# Patient Record
Sex: Female | Born: 1965 | Race: White | Hispanic: No | Marital: Married | Smoking: Never smoker
Health system: Southern US, Community
[De-identification: ages and names within clinical notes are randomized; demographics above are authoritative.]

## PROBLEM LIST (undated history)

## (undated) DIAGNOSIS — E1165 Type 2 diabetes mellitus with hyperglycemia: Secondary | ICD-10-CM

## (undated) DIAGNOSIS — K5909 Other constipation: Secondary | ICD-10-CM

## (undated) DIAGNOSIS — F1920 Other psychoactive substance dependence, uncomplicated: Secondary | ICD-10-CM

## (undated) DIAGNOSIS — G8929 Other chronic pain: Secondary | ICD-10-CM

## (undated) DIAGNOSIS — F419 Anxiety disorder, unspecified: Secondary | ICD-10-CM

## (undated) DIAGNOSIS — B1920 Unspecified viral hepatitis C without hepatic coma: Secondary | ICD-10-CM

## (undated) DIAGNOSIS — K7460 Unspecified cirrhosis of liver: Secondary | ICD-10-CM

## (undated) DIAGNOSIS — N939 Abnormal uterine and vaginal bleeding, unspecified: Secondary | ICD-10-CM

## (undated) DIAGNOSIS — K219 Gastro-esophageal reflux disease without esophagitis: Secondary | ICD-10-CM

## (undated) DIAGNOSIS — R768 Other specified abnormal immunological findings in serum: Secondary | ICD-10-CM

## (undated) DIAGNOSIS — R109 Unspecified abdominal pain: Secondary | ICD-10-CM

## (undated) DIAGNOSIS — F329 Major depressive disorder, single episode, unspecified: Secondary | ICD-10-CM

## (undated) DIAGNOSIS — F1020 Alcohol dependence, uncomplicated: Secondary | ICD-10-CM

## (undated) DIAGNOSIS — K8020 Calculus of gallbladder without cholecystitis without obstruction: Secondary | ICD-10-CM

## (undated) DIAGNOSIS — K859 Acute pancreatitis without necrosis or infection, unspecified: Secondary | ICD-10-CM

## (undated) HISTORY — DX: Type 2 diabetes mellitus with hyperglycemia: E11.65

## (undated) HISTORY — PX: APPENDECTOMY: SHX54

## (undated) HISTORY — DX: Other specified abnormal immunological findings in serum: R76.8

## (undated) HISTORY — DX: Hemochromatosis, unspecified: E83.119

## (undated) HISTORY — DX: Unspecified viral hepatitis C without hepatic coma: B19.20

## (undated) HISTORY — DX: Anxiety disorder, unspecified: F41.9

## (undated) HISTORY — DX: Acute pancreatitis without necrosis or infection, unspecified: K85.90

## (undated) HISTORY — DX: Other psychoactive substance dependence, uncomplicated: F19.20

## (undated) HISTORY — DX: Abnormal uterine and vaginal bleeding, unspecified: N93.9

## (undated) HISTORY — DX: Alcohol dependence, uncomplicated: F10.20

## (undated) HISTORY — DX: Other constipation: K59.09

## (undated) HISTORY — DX: Gastro-esophageal reflux disease without esophagitis: K21.9

## (undated) HISTORY — DX: Calculus of gallbladder without cholecystitis without obstruction: K80.20

## (undated) HISTORY — DX: Unspecified abdominal pain: R10.9

## (undated) HISTORY — DX: Other chronic pain: G89.29

## (undated) HISTORY — DX: Unspecified cirrhosis of liver: K74.60

---

## 2000-01-25 ENCOUNTER — Encounter: Payer: Self-pay | Admitting: Emergency Medicine

## 2000-01-25 ENCOUNTER — Emergency Department (HOSPITAL_COMMUNITY): Admission: EM | Admit: 2000-01-25 | Discharge: 2000-01-25 | Payer: Self-pay | Admitting: Emergency Medicine

## 2004-04-22 DIAGNOSIS — K8020 Calculus of gallbladder without cholecystitis without obstruction: Secondary | ICD-10-CM

## 2004-04-22 HISTORY — DX: Calculus of gallbladder without cholecystitis without obstruction: K80.20

## 2004-04-24 ENCOUNTER — Inpatient Hospital Stay (HOSPITAL_COMMUNITY): Admission: EM | Admit: 2004-04-24 | Discharge: 2004-04-29 | Payer: Self-pay | Admitting: Emergency Medicine

## 2004-04-26 ENCOUNTER — Encounter (INDEPENDENT_AMBULATORY_CARE_PROVIDER_SITE_OTHER): Payer: Self-pay | Admitting: Specialist

## 2004-05-08 ENCOUNTER — Ambulatory Visit: Payer: Self-pay | Admitting: Internal Medicine

## 2004-06-19 ENCOUNTER — Ambulatory Visit: Payer: Self-pay | Admitting: Internal Medicine

## 2004-06-25 ENCOUNTER — Ambulatory Visit (HOSPITAL_COMMUNITY): Admission: RE | Admit: 2004-06-25 | Discharge: 2004-06-25 | Payer: Self-pay | Admitting: Internal Medicine

## 2004-07-09 ENCOUNTER — Encounter (INDEPENDENT_AMBULATORY_CARE_PROVIDER_SITE_OTHER): Payer: Self-pay | Admitting: *Deleted

## 2004-07-09 ENCOUNTER — Ambulatory Visit (HOSPITAL_COMMUNITY): Admission: RE | Admit: 2004-07-09 | Discharge: 2004-07-09 | Payer: Self-pay | Admitting: Gastroenterology

## 2004-07-13 ENCOUNTER — Ambulatory Visit: Payer: Self-pay | Admitting: Internal Medicine

## 2004-07-18 ENCOUNTER — Ambulatory Visit (HOSPITAL_COMMUNITY): Admission: RE | Admit: 2004-07-18 | Discharge: 2004-07-18 | Payer: Self-pay | Admitting: Internal Medicine

## 2004-07-26 ENCOUNTER — Ambulatory Visit: Payer: Self-pay | Admitting: Internal Medicine

## 2004-08-02 ENCOUNTER — Ambulatory Visit: Payer: Self-pay | Admitting: Internal Medicine

## 2004-09-04 ENCOUNTER — Ambulatory Visit: Payer: Self-pay | Admitting: Internal Medicine

## 2004-11-23 ENCOUNTER — Ambulatory Visit: Payer: Self-pay | Admitting: Internal Medicine

## 2004-11-28 ENCOUNTER — Encounter (HOSPITAL_COMMUNITY): Admission: RE | Admit: 2004-11-28 | Discharge: 2005-02-26 | Payer: Self-pay | Admitting: Internal Medicine

## 2005-02-13 ENCOUNTER — Emergency Department (HOSPITAL_COMMUNITY): Admission: EM | Admit: 2005-02-13 | Discharge: 2005-02-13 | Payer: Self-pay | Admitting: Emergency Medicine

## 2005-02-28 ENCOUNTER — Encounter (HOSPITAL_COMMUNITY): Admission: RE | Admit: 2005-02-28 | Discharge: 2005-05-29 | Payer: Self-pay | Admitting: Internal Medicine

## 2005-05-09 ENCOUNTER — Ambulatory Visit: Payer: Self-pay | Admitting: Internal Medicine

## 2005-06-26 ENCOUNTER — Encounter (HOSPITAL_COMMUNITY): Admission: RE | Admit: 2005-06-26 | Discharge: 2005-09-24 | Payer: Self-pay | Admitting: Internal Medicine

## 2005-07-21 ENCOUNTER — Emergency Department (HOSPITAL_COMMUNITY): Admission: EM | Admit: 2005-07-21 | Discharge: 2005-07-22 | Payer: Self-pay | Admitting: Emergency Medicine

## 2005-12-05 ENCOUNTER — Ambulatory Visit: Payer: Self-pay | Admitting: Gastroenterology

## 2005-12-20 ENCOUNTER — Emergency Department (HOSPITAL_COMMUNITY): Admission: EM | Admit: 2005-12-20 | Discharge: 2005-12-21 | Payer: Self-pay | Admitting: Emergency Medicine

## 2006-02-10 ENCOUNTER — Emergency Department (HOSPITAL_COMMUNITY): Admission: EM | Admit: 2006-02-10 | Discharge: 2006-02-10 | Payer: Self-pay | Admitting: Emergency Medicine

## 2006-03-06 ENCOUNTER — Ambulatory Visit: Payer: Self-pay | Admitting: Gastroenterology

## 2006-04-03 ENCOUNTER — Ambulatory Visit: Payer: Self-pay | Admitting: Gastroenterology

## 2006-04-10 ENCOUNTER — Emergency Department (HOSPITAL_COMMUNITY): Admission: EM | Admit: 2006-04-10 | Discharge: 2006-04-11 | Payer: Self-pay | Admitting: Emergency Medicine

## 2006-04-11 ENCOUNTER — Ambulatory Visit: Payer: Self-pay | Admitting: *Deleted

## 2006-04-11 ENCOUNTER — Inpatient Hospital Stay (HOSPITAL_COMMUNITY): Admission: AD | Admit: 2006-04-11 | Discharge: 2006-04-14 | Payer: Self-pay | Admitting: *Deleted

## 2006-04-28 ENCOUNTER — Ambulatory Visit: Payer: Self-pay | Admitting: Cardiology

## 2006-04-28 ENCOUNTER — Inpatient Hospital Stay (HOSPITAL_COMMUNITY): Admission: EM | Admit: 2006-04-28 | Discharge: 2006-05-03 | Payer: Self-pay | Admitting: Emergency Medicine

## 2006-04-28 ENCOUNTER — Ambulatory Visit: Payer: Self-pay | Admitting: Internal Medicine

## 2006-04-29 ENCOUNTER — Encounter: Payer: Self-pay | Admitting: Cardiology

## 2006-10-04 ENCOUNTER — Emergency Department (HOSPITAL_COMMUNITY): Admission: EM | Admit: 2006-10-04 | Discharge: 2006-10-04 | Payer: Self-pay | Admitting: Emergency Medicine

## 2006-12-21 ENCOUNTER — Emergency Department (HOSPITAL_COMMUNITY): Admission: EM | Admit: 2006-12-21 | Discharge: 2006-12-22 | Payer: Self-pay | Admitting: Emergency Medicine

## 2006-12-22 ENCOUNTER — Ambulatory Visit: Payer: Self-pay | Admitting: Psychiatry

## 2006-12-22 ENCOUNTER — Inpatient Hospital Stay (HOSPITAL_COMMUNITY): Admission: AD | Admit: 2006-12-22 | Discharge: 2006-12-25 | Payer: Self-pay | Admitting: Psychiatry

## 2007-01-03 ENCOUNTER — Inpatient Hospital Stay (HOSPITAL_COMMUNITY): Admission: EM | Admit: 2007-01-03 | Discharge: 2007-01-10 | Payer: Self-pay | Admitting: Emergency Medicine

## 2007-01-04 ENCOUNTER — Ambulatory Visit: Payer: Self-pay | Admitting: Internal Medicine

## 2007-01-08 ENCOUNTER — Ambulatory Visit: Payer: Self-pay | Admitting: Gastroenterology

## 2007-02-01 ENCOUNTER — Emergency Department (HOSPITAL_COMMUNITY): Admission: EM | Admit: 2007-02-01 | Discharge: 2007-02-01 | Payer: Self-pay | Admitting: Emergency Medicine

## 2007-03-25 ENCOUNTER — Emergency Department (HOSPITAL_COMMUNITY): Admission: EM | Admit: 2007-03-25 | Discharge: 2007-03-25 | Payer: Self-pay | Admitting: Emergency Medicine

## 2007-05-03 ENCOUNTER — Emergency Department (HOSPITAL_COMMUNITY): Admission: EM | Admit: 2007-05-03 | Discharge: 2007-05-04 | Payer: Self-pay | Admitting: Emergency Medicine

## 2007-05-29 ENCOUNTER — Ambulatory Visit: Payer: Self-pay | Admitting: Internal Medicine

## 2007-05-29 DIAGNOSIS — G5600 Carpal tunnel syndrome, unspecified upper limb: Secondary | ICD-10-CM

## 2007-05-29 DIAGNOSIS — K920 Hematemesis: Secondary | ICD-10-CM

## 2007-05-29 DIAGNOSIS — F329 Major depressive disorder, single episode, unspecified: Secondary | ICD-10-CM

## 2007-05-29 DIAGNOSIS — R5383 Other fatigue: Secondary | ICD-10-CM

## 2007-05-29 DIAGNOSIS — R5381 Other malaise: Secondary | ICD-10-CM | POA: Insufficient documentation

## 2007-05-29 DIAGNOSIS — F1010 Alcohol abuse, uncomplicated: Secondary | ICD-10-CM | POA: Insufficient documentation

## 2007-05-29 DIAGNOSIS — K219 Gastro-esophageal reflux disease without esophagitis: Secondary | ICD-10-CM

## 2007-05-29 DIAGNOSIS — M2550 Pain in unspecified joint: Secondary | ICD-10-CM

## 2007-06-05 ENCOUNTER — Encounter (INDEPENDENT_AMBULATORY_CARE_PROVIDER_SITE_OTHER): Payer: Self-pay | Admitting: *Deleted

## 2007-06-05 LAB — CONVERTED CEMR LAB
ALT: 170 units/L — ABNORMAL HIGH (ref 0–35)
Alkaline Phosphatase: 60 units/L (ref 39–117)
Basophils Absolute: 0 10*3/uL (ref 0.0–0.1)
Bilirubin, Direct: 0.2 mg/dL (ref 0.0–0.3)
CO2: 30 meq/L (ref 19–32)
Calcium: 9.7 mg/dL (ref 8.4–10.5)
Creatinine, Ser: 0.8 mg/dL (ref 0.4–1.2)
Eosinophils Absolute: 0.1 10*3/uL (ref 0.0–0.6)
Eosinophils Relative: 1.9 % (ref 0.0–5.0)
GFR calc Af Amer: 102 mL/min
Glucose, Bld: 89 mg/dL (ref 70–99)
HCT: 40.1 % (ref 36.0–46.0)
Hemoglobin: 13.2 g/dL (ref 12.0–15.0)
MCHC: 32.8 g/dL (ref 30.0–36.0)
MCV: 96.1 fL (ref 78.0–100.0)
Monocytes Absolute: 0.4 10*3/uL (ref 0.2–0.7)
Neutrophils Relative %: 65 % (ref 43.0–77.0)
Potassium: 4.3 meq/L (ref 3.5–5.1)
Rhuematoid fact SerPl-aCnc: 37.1 intl units/mL — ABNORMAL HIGH (ref 0.0–20.0)
Saturation Ratios: 18.4 % — ABNORMAL LOW (ref 20.0–50.0)
TSH: 1.39 microintl units/mL (ref 0.35–5.50)
Total Bilirubin: 0.7 mg/dL (ref 0.3–1.2)
Total Protein: 7.3 g/dL (ref 6.0–8.3)
WBC: 4.2 10*3/uL — ABNORMAL LOW (ref 4.5–10.5)

## 2007-08-18 ENCOUNTER — Telehealth (INDEPENDENT_AMBULATORY_CARE_PROVIDER_SITE_OTHER): Payer: Self-pay | Admitting: *Deleted

## 2007-11-01 ENCOUNTER — Emergency Department (HOSPITAL_COMMUNITY): Admission: EM | Admit: 2007-11-01 | Discharge: 2007-11-01 | Payer: Self-pay | Admitting: Emergency Medicine

## 2008-02-07 ENCOUNTER — Emergency Department (HOSPITAL_COMMUNITY): Admission: EM | Admit: 2008-02-07 | Discharge: 2008-02-07 | Payer: Self-pay | Admitting: Emergency Medicine

## 2008-09-20 ENCOUNTER — Emergency Department (HOSPITAL_COMMUNITY): Admission: EM | Admit: 2008-09-20 | Discharge: 2008-09-20 | Payer: Self-pay | Admitting: Emergency Medicine

## 2009-01-21 ENCOUNTER — Emergency Department (HOSPITAL_COMMUNITY): Admission: EM | Admit: 2009-01-21 | Discharge: 2009-01-22 | Payer: Self-pay | Admitting: Emergency Medicine

## 2009-04-22 DIAGNOSIS — G8929 Other chronic pain: Secondary | ICD-10-CM

## 2009-04-22 HISTORY — DX: Other chronic pain: G89.29

## 2009-04-22 HISTORY — PX: PERCUTANEOUS LIVER BIOPSY: SUR136

## 2009-05-03 ENCOUNTER — Emergency Department (HOSPITAL_COMMUNITY): Admission: EM | Admit: 2009-05-03 | Discharge: 2009-05-03 | Payer: Self-pay | Admitting: Emergency Medicine

## 2009-05-30 ENCOUNTER — Telehealth (INDEPENDENT_AMBULATORY_CARE_PROVIDER_SITE_OTHER): Payer: Self-pay | Admitting: *Deleted

## 2009-05-30 ENCOUNTER — Ambulatory Visit: Payer: Self-pay | Admitting: Internal Medicine

## 2009-05-30 DIAGNOSIS — R799 Abnormal finding of blood chemistry, unspecified: Secondary | ICD-10-CM | POA: Insufficient documentation

## 2009-05-30 LAB — CONVERTED CEMR LAB
ALT: 238 units/L — ABNORMAL HIGH (ref 0–35)
Albumin: 3.6 g/dL (ref 3.5–5.2)
Basophils Relative: 1.3 % (ref 0.0–3.0)
Bilirubin, Direct: 0.3 mg/dL (ref 0.0–0.3)
CO2: 29 meq/L (ref 19–32)
Chloride: 106 meq/L (ref 96–112)
Creatinine, Ser: 0.7 mg/dL (ref 0.4–1.2)
Eosinophils Absolute: 0.1 10*3/uL (ref 0.0–0.7)
Eosinophils Relative: 3.4 % (ref 0.0–5.0)
Ferritin: 18.4 ng/mL (ref 10.0–291.0)
H Pylori IgG: NEGATIVE
HCT: 36 % (ref 36.0–46.0)
HDL: 95.9 mg/dL (ref >39.00)
Hemoglobin: 12 g/dL (ref 12.0–15.0)
Hgb A1c MFr Bld: 6.2 % (ref 4.6–6.5)
Ketones, ur: NEGATIVE mg/dL
Leukocytes, UA: NEGATIVE
Lymphs Abs: 1.1 10*3/uL (ref 0.7–4.0)
MCHC: 33.3 g/dL (ref 30.0–36.0)
MCV: 95 fL (ref 78.0–100.0)
Monocytes Absolute: 0.4 10*3/uL (ref 0.1–1.0)
Neutro Abs: 1 10*3/uL — ABNORMAL LOW (ref 1.4–7.7)
Neutrophils Relative %: 36.8 % — ABNORMAL LOW (ref 43.0–77.0)
Nitrite: NEGATIVE
Potassium: 3.8 meq/L (ref 3.5–5.1)
RBC: 3.78 M/uL — ABNORMAL LOW (ref 3.87–5.11)
Saturation Ratios: 14.2 % — ABNORMAL LOW (ref 20.0–50.0)
Specific Gravity, Urine: 1.01 (ref 1.000–1.030)
TSH: 0.64 microintl units/mL (ref 0.35–5.50)
Total Protein, Urine: NEGATIVE mg/dL
Total Protein: 8 g/dL (ref 6.0–8.3)
Transferrin: 291.9 mg/dL (ref 212.0–360.0)
Triglycerides: 74 mg/dL (ref 0.0–149.0)
WBC: 2.6 10*3/uL — ABNORMAL LOW (ref 4.5–10.5)
pH: 7 (ref 5.0–8.0)

## 2009-05-31 ENCOUNTER — Encounter (INDEPENDENT_AMBULATORY_CARE_PROVIDER_SITE_OTHER): Payer: Self-pay | Admitting: *Deleted

## 2009-06-02 LAB — CONVERTED CEMR LAB
HCV Ab: REACTIVE — AB
HCV Quantitative: 4940000 intl units/mL — ABNORMAL HIGH (ref <43)
Hep A IgM: NEGATIVE
Hepatitis B Surface Ag: NEGATIVE

## 2009-06-07 ENCOUNTER — Ambulatory Visit: Payer: Self-pay | Admitting: Internal Medicine

## 2009-06-27 ENCOUNTER — Ambulatory Visit: Payer: Self-pay | Admitting: Internal Medicine

## 2009-06-30 ENCOUNTER — Ambulatory Visit: Payer: Self-pay | Admitting: Internal Medicine

## 2009-06-30 DIAGNOSIS — R109 Unspecified abdominal pain: Secondary | ICD-10-CM

## 2009-06-30 DIAGNOSIS — E739 Lactose intolerance, unspecified: Secondary | ICD-10-CM

## 2009-06-30 LAB — CONVERTED CEMR LAB
Ketones, ur: NEGATIVE mg/dL
Urine Glucose: 250 mg/dL
Urobilinogen, UA: 4 (ref 0.0–1.0)

## 2009-07-10 ENCOUNTER — Inpatient Hospital Stay (HOSPITAL_COMMUNITY): Admission: EM | Admit: 2009-07-10 | Discharge: 2009-07-21 | Payer: Self-pay | Admitting: Emergency Medicine

## 2009-07-16 ENCOUNTER — Ambulatory Visit: Payer: Self-pay | Admitting: Psychiatry

## 2009-07-18 ENCOUNTER — Ambulatory Visit: Payer: Self-pay | Admitting: Gastroenterology

## 2009-07-19 ENCOUNTER — Encounter: Payer: Self-pay | Admitting: Gastroenterology

## 2009-07-24 ENCOUNTER — Ambulatory Visit: Payer: Self-pay | Admitting: Internal Medicine

## 2009-07-24 DIAGNOSIS — F10231 Alcohol dependence with withdrawal delirium: Secondary | ICD-10-CM

## 2009-07-24 DIAGNOSIS — K5900 Constipation, unspecified: Secondary | ICD-10-CM | POA: Insufficient documentation

## 2009-07-24 DIAGNOSIS — D259 Leiomyoma of uterus, unspecified: Secondary | ICD-10-CM

## 2009-07-24 DIAGNOSIS — D61818 Other pancytopenia: Secondary | ICD-10-CM | POA: Insufficient documentation

## 2009-07-25 ENCOUNTER — Telehealth: Payer: Self-pay | Admitting: Internal Medicine

## 2009-07-31 ENCOUNTER — Ambulatory Visit: Payer: Self-pay | Admitting: Internal Medicine

## 2009-07-31 LAB — CONVERTED CEMR LAB
BUN: 9 mg/dL (ref 6–23)
Basophils Relative: 0.3 % (ref 0.0–3.0)
Creatinine, Ser: 0.7 mg/dL (ref 0.4–1.2)
Eosinophils Relative: 2.1 % (ref 0.0–5.0)
GFR calc non Af Amer: 96.61 mL/min (ref >60)
Glucose, Bld: 101 mg/dL — ABNORMAL HIGH (ref 70–99)
HCT: 33.7 % — ABNORMAL LOW (ref 36.0–46.0)
Hemoglobin: 11.4 g/dL — ABNORMAL LOW (ref 12.0–15.0)
Lymphs Abs: 0.9 10*3/uL (ref 0.7–4.0)
MCV: 88.8 fL (ref 78.0–100.0)
Monocytes Absolute: 0.3 10*3/uL (ref 0.1–1.0)
Neutro Abs: 2 10*3/uL (ref 1.4–7.7)
Neutrophils Relative %: 60.4 % (ref 43.0–77.0)
Potassium: 3.5 meq/L (ref 3.5–5.1)
RBC: 3.79 M/uL — ABNORMAL LOW (ref 3.87–5.11)
WBC: 3.3 10*3/uL — ABNORMAL LOW (ref 4.5–10.5)

## 2009-09-13 ENCOUNTER — Emergency Department (HOSPITAL_COMMUNITY): Admission: EM | Admit: 2009-09-13 | Discharge: 2009-09-14 | Payer: Self-pay | Admitting: Emergency Medicine

## 2009-11-16 ENCOUNTER — Ambulatory Visit: Payer: Self-pay | Admitting: Gastroenterology

## 2009-11-16 ENCOUNTER — Encounter: Payer: Self-pay | Admitting: Internal Medicine

## 2009-12-11 ENCOUNTER — Ambulatory Visit (HOSPITAL_COMMUNITY): Admission: RE | Admit: 2009-12-11 | Discharge: 2009-12-11 | Payer: Self-pay | Admitting: Gastroenterology

## 2009-12-14 ENCOUNTER — Ambulatory Visit: Payer: Self-pay | Admitting: Gastroenterology

## 2009-12-23 ENCOUNTER — Emergency Department (HOSPITAL_COMMUNITY): Admission: EM | Admit: 2009-12-23 | Discharge: 2009-12-23 | Payer: Self-pay | Admitting: Emergency Medicine

## 2010-01-25 ENCOUNTER — Encounter: Payer: Self-pay | Admitting: Internal Medicine

## 2010-01-25 ENCOUNTER — Ambulatory Visit: Payer: Self-pay | Admitting: Gastroenterology

## 2010-03-07 ENCOUNTER — Encounter: Admission: RE | Admit: 2010-03-07 | Discharge: 2010-03-07 | Payer: Self-pay | Admitting: Internal Medicine

## 2010-03-07 LAB — HM MAMMOGRAPHY: HM Mammogram: NEGATIVE

## 2010-03-18 ENCOUNTER — Emergency Department (HOSPITAL_COMMUNITY): Admission: EM | Admit: 2010-03-18 | Discharge: 2010-03-18 | Payer: Self-pay | Admitting: Family Medicine

## 2010-05-03 ENCOUNTER — Ambulatory Visit: Admit: 2010-05-03 | Payer: Self-pay | Admitting: Internal Medicine

## 2010-05-13 ENCOUNTER — Encounter: Payer: Self-pay | Admitting: Internal Medicine

## 2010-05-13 ENCOUNTER — Encounter: Payer: Self-pay | Admitting: Gastroenterology

## 2010-05-22 NOTE — Letter (Signed)
Summary: [facility] Consult Scheduled Letter  [facility]  [address] [place], [place] [address]   Phone: [phone]  Fax: [phone]      05/31/2009 MRN: [id number]  [facility] [address] [place], [place]  [address]    Dear Ms. Bilello,      We have scheduled an appointment for you.  At the recommendation of Dr.John, we have scheduled you a consult with Dr.Gessner( [facility]) on March 8,2011 at 9:00 am.  Their phone number is [phone].If this appointment day and time is not convenient for you, please feel free to call the office of the doctor you are being referred to at the number listed above and reschedule the appointment.  [facility] [address]   Thank you,  Patient Care Coordinator [facility]

## 2010-05-22 NOTE — Assessment & Plan Note (Signed)
Summary: 1 WK POST HOSP  STC   Vital Signs:  Patient profile:   45 year old female Height:      62 inches Weight:      139.50 pounds BMI:     25.61 O2 Sat:      94 % on Room air Temp:     99.9 degrees F oral Pulse rate:   116 / minute BP sitting:   98 / 60  (left arm) Cuff size:   regular  Vitals Entered ByZella Ball Ewing (July 24, 2009 4:50 PM)  O2 Flow:  Room air CC: 1 week post hospital/RE   CC:  1 week post hospital/RE.  History of Present Illness: here overall doing Ok,  except feels "head cloudy" on the 200 mg sertraline,  pt recently hospd with  abd pain felt due to constipatoin/uterine fibroid and/or IBS, went through DT's successfully, and noted pancytopenia likely due to ETOH.  Pt now d/c'd since apr 1, states good compliance with meds, except not taking the metformin ER 500.  Sugars while hospd seemed fine, and she had this 1 per day as a "temp" med prior (see EMR for details).  Not known to have persistent elev BS' s but CBG in the office today 232.  Denies polys, not clear if the metformin she had for a week contributed to her constipation.  Still with some ongoing consitpation but also has been using the limited rx oxycodone from d/c as well. Eating better at home, here with mother who corroborates.  No ETOH use since d/c.  Still somewhat tremulous and shaky but the librium helps.  No n/vfever , St, cough and Pt denies CP, sob, doe, wheezing, orthopnea, pnd, worsening LE edema, palps, dizziness or syncope   No reflux symtpoms, or dysphagia, blood.  No overt bleeding or bruising,  vag bleeding stopped, has appt to f/u with GYN.    Preventive Screening-Counseling & Management      Drug Use:  no.    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 2 By Mouth Once Daily 2)  Sertraline Hcl 100 Mg Tabs (Sertraline Hcl) .... 2po Once Daily 3)  Chlordiazepoxide Hcl 25 Mg Caps (Chlordiazepoxide Hcl) .Marland Kitchen.. 1 - 2 By Mouth Two Times A Day As Needed 4)  Metformin Hcl 500 Mg Xr24h-Tab (Metformin Hcl) .Marland Kitchen.. 1 By Mouth Once Daily For 7 Days To Help With Sugar 5)  Ciprofloxacin Hcl 500 Mg Tabs (Ciprofloxacin Hcl) .Marland Kitchen.. 1po Two Times A Day  Current Medications (verified): 1)  Omeprazole 20 Mg  Cpdr (Omeprazole) .... 2 By Mouth Once Daily 2)  Sertraline Hcl 100 Mg Tabs (Sertraline Hcl) .Marland Kitchen.. 1 Po Once Daily 3)  Chlordiazepoxide Hcl 25 Mg Caps (Chlordiazepoxide Hcl) .Marland Kitchen.. 1 - 2 By Mouth Two Times A Day As Needed 4)  Freestyle Lite Test  Strp (Glucose Blood) .... Use Asd 1 Once  Daily 5)  Lancets  Misc (Lancets) .... Use Asd 1 Once Daily 6)  Folic Acid 1 Mg Tabs (Folic Acid) .Marland Kitchen.. 1po Once Daily 7)  Thiamine Hcl 100 Mg Tabs (Thiamine Hcl) .Marland Kitchen.. 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   Head:  normocephalic and atraumatic.   Eyes:  vision grossly intact, pupils equal, and pupils round.   Ears:  R ear normal and L ear normal.   Nose:  no external deformity and no nasal discharge.   Mouth:  no gingival abnormalities and pharynx pink and moist.   Neck:  supple and no masses.   Lungs:  normal respiratory effort and normal breath sounds.   Heart:  normal rate and regular rhythm.   Abdomen:  soft, non-tender, and normal bowel sounds.   Msk:  no joint tenderness and no joint swelling.   Extremities:  no edema, no erythema  Psych:  not depressed appearing and moderately anxious.     Impression & Recommendations:  Problem # 1:  CONSTIPATION (ICD-564.00) chronic, for miralax daily  Problem # 2:  DEPRESSION (ICD-311)  Her updated medication list for this problem includes:    Sertraline Hcl 100 Mg Tabs (Sertraline hcl) .Marland Kitchen... 1 po once daily    Chlordiazepoxide Hcl 25 Mg Caps (Chlordiazepoxide hcl) .Marland Kitchen... 1 - 2 by mouth two times a day as needed ok for reduced sertraline as above  Problem # 3:  PANCYTOPENIA (ICD-284.1)  Her updated medication list for this problem includes:    Folic Acid 1 Mg Tabs (Folic acid) .Marland Kitchen... 1po once daily to cont off ETOH;  for f/u lab one wk, declines further labs today  Problem # 4:  GLUCOSE INTOLERANCE (ICD-271.3) ? DM - gave glucometer and strips;  to check sugars and call with results in 3 days;  also check a1c with next labs in 1 wk  Complete Medication List: 1)  Omeprazole 20 Mg Cpdr (Omeprazole) .... 2 by mouth once daily 2)  Sertraline Hcl 100 Mg Tabs (Sertraline hcl) .Marland Kitchen.. 1 po  once daily 3)  Chlordiazepoxide Hcl 25 Mg Caps (Chlordiazepoxide hcl) .Marland Kitchen.. 1 - 2 by mouth two times a day as needed 4)  Freestyle Lite Test Strp (Glucose blood) .... Use asd 1 once daily 5)  Lancets Misc (Lancets) .... Use asd 1 once daily 6)  Folic Acid 1 Mg Tabs (Folic acid) .Marland Kitchen.. 1po once daily 7)  Thiamine Hcl 100 Mg Tabs (Thiamine hcl) .Marland Kitchen.. 1po once daily  Other Orders: Fingerstick (16[address]) Glucose, (CBG) (60454)  Patient Instructions: 1)  take miralax 17 gm in water per day with water 2)  check your sugars twice  per day and call friday later this wk with your numbers (you are given the glucometer and strips today) 3)  decrease the sertraline to 1 per day 4)  Continue all other previous medications as before this visit  5)  Please return for LAB only in 1 wk: 6)  BMP prior to visit, ICD-9: 790.2 7)  HbgA1C prior to visit, ICD-9: 790.2 8)  CBC w/ Diff prior to visit, ICD-9: 285.0 9)  Please schedule a follow-up appointment in 2 months, or sooner if needed Prescriptions: LANCETS  MISC (LANCETS) use asd 1 once daily  #100 x 11   Entered and Authorized by:   Corwin Levins MD   Signed by:   Corwin Levins MD on 07/24/2009   Method used:   Print then Give to Patient   RxID:   [id number] FREESTYLE LITE TEST  STRP (GLUCOSE BLOOD) use asd 1 once daily  #100 x 11   Entered and Authorized by:   Corwin Levins MD   Signed by:   Corwin Levins MD on 07/24/2009   Method used:   Print then Give to Patient   RxID:   [id number] SERTRALINE HCL 100 MG TABS (SERTRALINE HCL) 1 po once daily  #90 x 3   Entered and Authorized by:   Corwin Levins MD   Signed by:   Corwin Levins MD on 07/24/2009   Method used:   Print then Give to Patient   RxID:   [id number]

## 2010-05-22 NOTE — Procedures (Signed)
Summary: EGD: + H. Pylori   EGD  Procedure date:  08/02/2004  Findings:      Location: [facility]   Patient Name: [name], [name]. MRN:  Procedure Procedures: Panendoscopy (EGD) CPT: 43235.    with biopsy(s)/brushing(s). CPT: D1846139.  Personnel: Endoscopist: Iva Boop, MD, [facility].  Referred By: Corwin Levins, MD.  Exam Location: Exam performed in Outpatient Clinic. Outpatient  Patient Consent: Procedure, Alternatives, Risks and Benefits discussed, consent obtained, from patient. Consent was obtained by the RN.  Indications Symptoms: Abdominal pain, location: RUQ. Reflux symptoms  History  Current Medications: Patient is not currently taking Coumadin.  Pre-Exam Physical: Performed Aug 02, 2004  Cardio-pulmonary exam, HEENT exam WNL. Abdominal exam abnormal. Mental status exam WNL. Abnormal PE findings include: tender RUQ.  Exam Exam Info: Maximum depth of insertion Duodenum, intended Duodenum. Patient position: on left side. Gastric retroflexion performed. Images taken. ASA Classification: III. Tolerance: good.  Sedation Meds: Patient assessed and found to be appropriate for moderate (conscious) sedation. Fentanyl 50 mcg. given IV. Versed 5 mg. given IV. Cetacaine Spray 2 sprays given aerosolized.  Monitoring: BP and pulse monitoring done. Oximetry used. Supplemental O2 given  Findings - Normal: Proximal Esophagus to Body.  - Normal: Duodenal Bulb to Duodenal 2nd Portion.  - MUCOSAL ABNORMALITY: Antrum. Erosions present. Erythematous mucosa. Biopsy/Mucosal Abn taken. RUT done, results pending. ICD9: Gastritis, Unspecified: 535.50. Comment: several superficial erosisons, biopsy for RUT.   Assessment Abnormal examination, see findings above.  Diagnoses: 535.50: Gastritis, Unspecified.   Comments: Mild Erosive Gastritis. I doubt this is the cause of her pain. Events  Unplanned Intervention: No unplanned interventions were required.   Plans Comments: Since she complains of heartburn and has this gastritis, will start a PPI. Will treat H. pylori if positive. Needs to try heat to abdominal wall as I think at least some of her symptoms are musculoskeletal. If that is not helpful could try NSAID's but only on a PPI. I doubt her pain is from her gallbladder.  Disposition: After procedure patient sent to recovery. After recovery patient sent home.  Comments: She needs to do labs that were scheduled for last week. If PPI doesn't work then needs to consider further musculoskeletal evaluation vs. surgery evaluation.   CC:   Oliver Barre, MD   Ovidio Kin, MD  This report was created from the original endoscopy report, which was reviewed and signed by the above listed endoscopist.

## 2010-05-22 NOTE — Progress Notes (Signed)
----   Converted from flag ---- ---- 05/30/2009 3:20 PM, Corwin Levins MD wrote: pleaes send addon  to lab:  hgba1c - 790.2 ------------------------------ I sent addon to the lab

## 2010-05-22 NOTE — Progress Notes (Signed)
----   Converted from flag ---- ---- 07/24/2009 5:36 PM, Corwin Levins MD wrote: please call pt - needs future labs next mon apr 11:  cbc 285.9 bmet  790.2 hgba1c  790.2 ------------------------------  called pt left msg to call back 07/25/2009 called pt and informed of information and scheduled pt for labs 07/31/2009 07/25/2009

## 2010-05-22 NOTE — Letter (Signed)
Summary: New Patient letter  [facility]  [address] [place], [place] [address]   Phone: [phone]  Fax: [phone]       05/31/2009 MRN: [id number]  [facility] [address] [place], [place]  [address]  Dear [name],  Welcome to the Gastroenterology Division at [facility].    You are scheduled to see Dr. Leone Payor on 06-27-09 at 9:00a.m. on the 3rd floor at [facility], [address]  We ask that you try to arrive at our office 15 minutes prior to your appointment time to allow for check-in.  We would like you to complete the enclosed self-administered evaluation form prior to your visit and bring it with you on the day of your appointment.  We will review it with you.  Also, please bring a complete list of all your medications or, if you prefer, bring the medication bottles and we will list them.  Please bring your insurance card so that we may make a copy of it.  If your insurance requires a referral to see a specialist, please bring your referral form from your primary care physician.  Co-payments are due at the time of your visit and may be paid by cash, check or credit card.     Your office visit will consist of a consult with your physician (includes a physical exam), any laboratory testing he/she may order, scheduling of any necessary diagnostic testing (e.g. x-ray, ultrasound, CT-scan), and scheduling of a procedure (e.g. Endoscopy, Colonoscopy) if required.  Please allow enough time on your schedule to allow for any/all of these possibilities.    If you cannot keep your appointment, please call [phone] to cancel or reschedule prior to your appointment date.  This allows [place] the opportunity to schedule an appointment for another patient in need of care.  If you do not cancel or reschedule by 5 p.m. the business day prior to your appointment date, you will be charged a $50.00 late cancellation/no-show fee.    Thank you for choosing  [facility] for your medical needs.  We appreciate the opportunity to care for you.  Please visit [place] at our website  to learn more about our practice.                     Sincerely,                                                             The Gastroenterology Division

## 2010-05-22 NOTE — Procedures (Signed)
Summary: EGD/[facility]  EGD/[facility]   Imported By: Sherian Rein 07/25/2009 13:35:03  _____________________________________________________________________  External Attachment:    Type:   Image     Comment:   External Document

## 2010-05-22 NOTE — Assessment & Plan Note (Signed)
Summary: SWOLLEN BELOW BREASTS/ NWS  #   Vital Signs:  Patient profile:   45 year old female Height:      62 inches Weight:      146 pounds BMI:     26.80 O2 Sat:      96 % on Room air Temp:     98 degrees F oral Pulse rate:   89 / minute BP sitting:   116 / 70  (left arm) Cuff size:   regular  Vitals Entered ByZella Ball Ewing (May 30, 2009 9:31 AM)  O2 Flow:  Room air  CC: upper abdomen swollen/RE   CC:  upper abdomen swollen/RE.  History of Present Illness: here after lost to f/u for 2 yrs, for wellness, but also with gen'd abd pain, and swelling,  worse to upper abd area;  some nausea, vomit and small volume hematemesis 2 days ago;  no overt bleeding at this time and not vomited for 2 days;  no orthostatic, but has night sweats and diffculty sleeping.  No fever . cont's to have difficutl marital relationship and drinking continues - approx 1 pint per day.  No longer has a job after laid off from [employer] when it closed.  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 1 By Mouth Qid Prn 2)  Omeprazole 20 Mg  Cpdr (Omeprazole) .... 2 By Mouth Qd  Current Medications (verified): 1)  Omeprazole 20 Mg  Cpdr (Omeprazole) .... 2 By Mouth Once Daily 2)  Sertraline Hcl 100 Mg Tabs (Sertraline Hcl) .... 2po Once  Daily 3)  Chlordiazepoxide Hcl 25 Mg Caps (Chlordiazepoxide Hcl) .Marland Kitchen.. 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 mother with ovary cancer father had DM  Social History: Reviewed history from 05/29/2007 and no changes required. 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  The patient denies anorexia, fever, weight loss, vision loss, decreased hearing, hoarseness, chest pain, syncope, dyspnea on exertion, peripheral edema, prolonged cough, headaches, hemoptysis, melena, hematochezia, severe indigestion/heartburn, hematuria, incontinence, muscle weakness, suspicious skin lesions, difficulty walking,  abnormal bleeding, enlarged lymph nodes, and angioedema.         all otherwise negative per pt - had recent DUI with loss of  license - here today with mother having colonoscopy with Dr Juanda Chance;  alsowith anxiety and depressive symtpoms - no suicidal ideation,  or panic, but drinking more lately with some shakes today but no n/v ;    Physical Exam  General:  alert and well-developed.   Head:  normocephalic and atraumatic.   Eyes:  vision  grossly intact, pupils equal, and pupils round.   Ears:  R ear normal and L ear normal.   Nose:  no external deformity and no nasal discharge.   Mouth:  no gingival abnormalities and pharynx pink and moist.   Neck:  supple and no masses.   Lungs:  normal respiratory effort and normal breath sounds.   Heart:  normal rate and regular rhythm.   Abdomen:  soft and normal bowel sounds. with mild epigastric tender, no organomegaly Msk:  no joint tenderness and no joint swelling.   Extremities:  no edema, no erythema  Neurologic:  cranial nerves II-XII intact and strength normal in all extremities.   Skin:  color normal and no rashes.     Impression & Recommendations:  Problem # 1:  Preventive Health Care (ICD-V70.0)  Overall doing well, age appropriate education and counseling updated and referral for appropriate preventive services done unless declined, immunizations up to date or declined, diet counseling done if overweight, urged to quit smoking if smokes , most recent labs reviewed and current ordered if appropriate, ecg reviewed or declined (interpretation per ECG scanned in the EMR if done); information regarding Medicare Prevention requirements given if appropriate   Orders: TLB-BMP (Basic Metabolic Panel-BMET) (80048-METABOL) TLB-CBC Platelet - w/Differential (85025-CBCD) TLB-Hepatic/Liver Function Pnl (80076-HEPATIC) TLB-Lipid Panel (80061-LIPID) TLB-TSH (Thyroid Stimulating Hormone) (84443-TSH) TLB-Udip ONLY (81003-UDIP)  Problem # 2:  OTHER HEMOCHROMATOSIS (ICD-275.03)  for lab eval today, consider heme for f/u, but declines at this time for now  Orders: TLB-IBC Pnl (Iron/FE;Transferrin) (83550-IBC) TLB-Ferritin (82728-FER)  Problem # 3:  RHEUMATOID FACTOR, POSITIVE (ICD-790.99) no synovitis today; ok to follow  Problem # 4:  DEPRESSION (ICD-311)  Her updated medication list for this problem includes:    Sertraline Hcl 100 Mg Tabs (Sertraline hcl) .Marland Kitchen... 2po once daily     Chlordiazepoxide Hcl 25 Mg Caps (Chlordiazepoxide hcl) .Marland Kitchen... 1 by mouth three times a day as needed treat as above, f/u any worsening signs or symptoms ,  declines counseling  Problem # 5:  HEPATITIS C (ICD-070.51)  for hep c ab, and quan RNA, as well as INR, LFT's, refer to [facility]  Orders: T-Hepatitis Profile Acute (51025-85277) T-Hepatitis C RNA Quant PCR (82423-53614) TLB-PT (Protime) (85610-PTP) Misc. Referral (Misc. Ref)  Problem # 6:  GERD (ICD-530.81)  Her updated medication list for this problem includes:    Omeprazole 20 Mg Cpdr (Omeprazole) .Marland Kitchen... 2 by mouth once daily to re-start  PPI  Orders: Gastroenterology Referral (GI)  Problem # 7:  ALCOHOL ABUSE (ICD-305.00) urged absticnence, so that she might get hep c tx  Problem # 8:  ABDOMINAL PAIN, EPIGASTRIC (ICD-789.06)  prob gastritis with recent small volume hemetemesis, cant r/o varices - refer GI, check CT abd/pelvis, lipase, h pylori  Orders: TLB-Lipase (83690-LIPASE) TLB-H. Pylori Abs(Helicobacter Pylori) (86677-HELICO) Gastroenterology Referral (GI) Radiology Referral (Radiology)  Complete Medication List: 1)  Omeprazole 20 Mg Cpdr (Omeprazole) .... 2 by mouth once daily 2)  Sertraline  Hcl 100 Mg Tabs (Sertraline hcl) .... 2po once daily 3)  Chlordiazepoxide Hcl 25 Mg Caps (Chlordiazepoxide hcl) .Marland Kitchen.. 1 by mouth three times a day as needed  Other Orders: Tdap => 30yrs IM (54098) Admin 1st Vaccine (11914)  Patient Instructions: 1)  you had the tetanus shot today 2)  please see your GYN for pap smear and mammogram 3)  Please take all new medications as prescribed - the sertraline (generic zoloft) is started at HALF pill for 3 days, then whole pill for 3 days, then 1 and 1/2 pills for 3 days, then 2 pills per day after that 4)  please consider re-start counseling 5)  please stop drinking 6)  Please go to the Lab in the basement for your blood and/or urine tests today  7)  You will be contacted  about the referral(s) to: CT scan, [facility], as well as the [facility] Hep C clinic in [place] 8)  Please schedule a follow-up appointment in 1 month. Prescriptions: CHLORDIAZEPOXIDE HCL 25 MG CAPS (CHLORDIAZEPOXIDE HCL) 1 by mouth three times a day as needed  #90 x 0   Entered and Authorized by:   Corwin Levins MD   Signed by:   Corwin Levins MD on 05/30/2009   Method used:   Print then Give to Patient   RxID:   [id number] OMEPRAZOLE 20 MG  CPDR (OMEPRAZOLE) 2 by mouth once daily  #60 x 11   Entered and Authorized by:   Corwin Levins MD   Signed by:   Corwin Levins MD on 05/30/2009   Method used:   Electronically to        [facility]  [address] [id number] (retail)       [address]       [place], [place]  [address]       Ph: [phone] or [phone]       Fax: [phone]   RxID:   [id number] SERTRALINE HCL 100 MG TABS (SERTRALINE HCL) 2po once daily  #60 x 11   Entered and Authorized by:   Corwin Levins MD   Signed by:   Corwin Levins MD on 05/30/2009   Method used:   [facility]ically to        [facility]  [address] [id number] ([facility]       [address]       [place], [place]  [address]       Ph: [phone] or [phone]       Fax: [phone]   RxID:   [id number] OMEPRAZOLE 20 MG  CPDR (OMEPRAZOLE) 2 by mouth once daily  #180 x 3   Entered and Authorized by:   Corwin Levins MD   Signed by:   Corwin Levins MD on 05/30/2009   Method used:   Print then Give to Patient   RxID:   [id number]    Immunizations Administered:  Tetanus Vaccine:    Vaccine Type: Tdap    Site: right deltoid    Mfr: GlaxoSmithKline    Dose: 0.5 ml    Route: IM    Given by: Robin Ewing    Exp. Date: 06/17/2011    Lot #: [id number]    VIS given: 03/10/07 version given May 30, 2009.

## 2010-05-22 NOTE — Consult Note (Signed)
Summary: [facility]  [facility]   Imported By: Sherian Rein 12/05/2009 11:15:16  _____________________________________________________________________  External Attachment:    Type:   Image     Comment:   External Document

## 2010-05-22 NOTE — Assessment & Plan Note (Signed)
Summary: 1 MO ROV /NWS  #   Vital Signs:  Patient profile:   45 year old female Height:      62 inches Weight:      142 pounds BMI:     26.07 O2 Sat:      99 % on Room air Temp:     97.1 degrees F oral Pulse rate:   83 / minute BP sitting:   92 / 70  (left arm) Cuff size:   regular  Vitals Entered ByZella Ball Ewing (June 30, 2009 9:25 AM)  O2 Flow:  Room air CC: 1 mo ROV/RE   CC:  1 mo ROV/RE.  History of Present Illness: here with lower abd pain with some urinary freq, without radiation, n/v, back pain, chills , for 3 days.  denies polys or low sugars;  Pt denies CP, sob, doe, wheezing, orthopnea, pnd, worsening LE edema, palps, dizziness or syncope   Pt denies new neuro symptoms such as headache, facial or extremity weakness   Blood sugar 216 today  Problems Prior to Update: 1)  Glucose Intolerance  (ICD-271.3) 2)  Abdominal Tenderness, Right Upper Quadrant  (ICD-789.61) 3)  Abdominal Pain, Lower  (ICD-789.09) 4)  Abdominal Pain, Epigastric  (ICD-789.06) 5)  Preventive Health Care  (ICD-V70.0) 6)  Rheumatoid Factor, Positive  (ICD-790.99) 7)  Other Hemochromatosis  (ICD-275.03) 8)  Fatigue  (ICD-780.79) 9)  Hematemesis  (ICD-578.0) 10)  Polyarthralgia  (ICD-719.49) 11)  Carpal Tunnel Syndrome, 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 2 By Mouth Once Daily 2)  Sertraline Hcl 100 Mg Tabs (Sertraline Hcl) .... 2po Once Daily 3)  Chlordiazepoxide Hcl 25 Mg Caps (Chlordiazepoxide Hcl) .Marland Kitchen.. 1 By Mouth Three Times A Day As Needed  Current Medications (verified): 1)  Omeprazole 20 Mg  Cpdr (Omeprazole) .... 2 By Mouth Once Daily 2)  Sertraline Hcl 100 Mg Tabs (Sertraline Hcl) .... 2po Once Daily 3)  Chlordiazepoxide Hcl 25 Mg Caps (Chlordiazepoxide Hcl) .Marland Kitchen.. 1 - 2 By Mouth Two Times A Day As Needed 4)   Metformin Hcl 500 Mg Xr24h-Tab (Metformin Hcl) .Marland Kitchen.. 1 By Mouth Once Daily For 7 Days To Help With Sugar 5)  Ciprofloxacin Hcl 500 Mg Tabs (Ciprofloxacin Hcl) .Marland Kitchen.. 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   Head:  normocephalic and atraumatic.   Eyes:  vision grossly intact, pupils equal, and pupils round.   Ears:  R ear normal and L ear normal.   Nose:  no external deformity and no nasal discharge.   Mouth:  no gingival abnormalities and pharynx pink and moist.   Neck:  supple and no masses.   Lungs:  normal respiratory effort and normal breath sounds.   Heart:  normal rate and regular rhythm.   Abdomen:  soft and normal bowel sounds.  with mild tender lower mid abd, and mod tender RUQ without guarding or rebound Extremities:  no edema, no erythema   Impression & Recommendations:  Problem # 1:  ABDOMINAL PAIN, LOWER (ICD-789.09)  wtih urinary freq - suspect UTI, cant r/o other such as diverticulitis;  for cipro course, check urine studies, consider CT  Orders: T-Culture, Urine (16109-60454) TLB-Udip w/ Micro (81001-URINE)  Problem # 2:  ABDOMINAL TENDERNESS, RIGHT UPPER QUADRANT (ICD-789.61)  no pain complaints, but marked tender on exam - for u/s  Orders: Radiology Referral  (Radiology)  Problem # 3:  GLUCOSE INTOLERANCE (ICD-271.3) hyerglycemia likely reactive    - for metformin for 1 wk only  Problem # 4:  ALCOHOL ABUSE (ICD-305.00) none for one month, tremulous today - for librium refill   Complete Medication List: 1)  Omeprazole 20 Mg Cpdr (Omeprazole) .... 2 by mouth once daily 2)  Sertraline Hcl 100 Mg Tabs (Sertraline hcl) .... 2po once daily 3)  Chlordiazepoxide Hcl 25 Mg Caps (Chlordiazepoxide hcl) .Marland Kitchen.. 1 - 2 by mouth two times a day as needed 4)  Metformin Hcl 500 Mg Xr24h-tab (Metformin hcl) .Marland Kitchen.. 1 by mouth once daily for 7 days to help with sugar 5)  Ciprofloxacin Hcl 500 Mg Tabs (Ciprofloxacin hcl) .Marland Kitchen.. 1po two times a day  Other Orders: Glucose, (CBG) (09811) Fingerstick [id number])  Patient Instructions: 1)  Please take all new medications as prescribed - the antibiotic, and generic librium for the shakes, and the metformin one per day for blood sugar only for the next wk 2)  Continue all previous medications as before this visit  3)  Please go to the Lab in the basement for your urine tests today  4)  You will be contacted about the referral(s) to: Ultrasound 5)  please keep your appt with Dr Leone Payor later this month 6)  You should be called eventually for the referral to the GI clinic for the Hep C 7)  Please schedule a follow-up appointment in 6 months. or sooner if needed: with: 8)  BMP prior to visit, ICD-9: 790.2 9)  Lipid Panel prior to visit, ICD-9: 10)  HbgA1C prior to visit, ICD-9: Prescriptions: CIPROFLOXACIN HCL 500 MG TABS (CIPROFLOXACIN HCL) 1po two times a day  #20 x 0   Entered and Authorized by:   Corwin Levins MD   Signed by:   Corwin Levins MD on 06/30/2009   Method used:   Print then Give to Patient   RxID:   [id number] METFORMIN HCL 500 MG XR24H-TAB (METFORMIN HCL) 1 by mouth once daily for 7 days to help with sugar  #7 x 0   Entered and Authorized by:   Corwin Levins MD   Signed by:   Corwin Levins MD on  06/30/2009   Method used:   Print then Give to Patient   RxID:   [id number] CHLORDIAZEPOXIDE HCL 25 MG CAPS (CHLORDIAZEPOXIDE HCL) 1 - 2 by mouth two times a day as needed  #60 x 1   Entered and Authorized by:   Corwin Levins MD   Signed by:   Corwin Levins MD on 06/30/2009   Method used:   Print then Give to Patient   RxID:   [id number]

## 2010-05-22 NOTE — Consult Note (Signed)
Summary: [facility]  [facility]   Imported By: Lennie Odor 02/12/2010 11:42:34  _____________________________________________________________________  External Attachment:    Type:   Image     Comment:   External Document

## 2010-07-06 LAB — CBC
Hemoglobin: 12.7 g/dL (ref 12.0–15.0)
MCH: 31.4 pg (ref 26.0–34.0)
MCHC: 33 g/dL (ref 30.0–36.0)

## 2010-07-06 LAB — PROTIME-INR
INR: 1.08 (ref 0.00–1.49)
Prothrombin Time: 14.2 seconds (ref 11.6–15.2)

## 2010-07-06 LAB — APTT: aPTT: 30 seconds (ref 24–37)

## 2010-07-09 LAB — CBC
MCHC: 32.5 g/dL (ref 30.0–36.0)
Platelets: 132 10*3/uL — ABNORMAL LOW (ref 150–400)
RDW: 23.1 % — ABNORMAL HIGH (ref 11.5–15.5)

## 2010-07-09 LAB — DIFFERENTIAL
Eosinophils Absolute: 0 10*3/uL (ref 0.0–0.7)
Eosinophils Relative: 0 % (ref 0–5)
Lymphs Abs: 1.6 10*3/uL (ref 0.7–4.0)
Monocytes Relative: 10 % (ref 3–12)

## 2010-07-09 LAB — URINALYSIS, ROUTINE W REFLEX MICROSCOPIC
Glucose, UA: NEGATIVE mg/dL
Ketones, ur: NEGATIVE mg/dL
Leukocytes, UA: NEGATIVE
pH: 6.5 (ref 5.0–8.0)

## 2010-07-09 LAB — URINE MICROSCOPIC-ADD ON

## 2010-07-09 LAB — COMPREHENSIVE METABOLIC PANEL
ALT: 90 U/L — ABNORMAL HIGH (ref 0–35)
AST: 159 U/L — ABNORMAL HIGH (ref 0–37)
Calcium: 8.5 mg/dL (ref 8.4–10.5)
GFR calc Af Amer: >60 mL/min (ref >60)
Sodium: 147 mEq/L — ABNORMAL HIGH (ref 135–145)
Total Protein: 7.8 g/dL (ref 6.0–8.3)

## 2010-07-15 LAB — URINE MICROSCOPIC-ADD ON

## 2010-07-15 LAB — H. PYLORI ANTIBODY, IGG: H Pylori IgG: 1.2 {ISR} — ABNORMAL HIGH

## 2010-07-15 LAB — BASIC METABOLIC PANEL
BUN: 3 mg/dL — ABNORMAL LOW (ref 6–23)
BUN: 5 mg/dL — ABNORMAL LOW (ref 6–23)
BUN: <1 mg/dL — ABNORMAL LOW (ref 6–23)
Calcium: 7.4 mg/dL — ABNORMAL LOW (ref 8.4–10.5)
Calcium: 8.7 mg/dL (ref 8.4–10.5)
Chloride: 101 mEq/L (ref 96–112)
Chloride: 102 mEq/L (ref 96–112)
GFR calc non Af Amer: >60 mL/min (ref >60)
GFR calc non Af Amer: >60 mL/min (ref >60)
GFR calc non Af Amer: >60 mL/min (ref >60)
GFR calc non Af Amer: >60 mL/min (ref >60)
Glucose, Bld: 120 mg/dL — ABNORMAL HIGH (ref 70–99)
Glucose, Bld: 122 mg/dL — ABNORMAL HIGH (ref 70–99)
Glucose, Bld: 144 mg/dL — ABNORMAL HIGH (ref 70–99)
Potassium: 3.2 mEq/L — ABNORMAL LOW (ref 3.5–5.1)
Potassium: 3.7 mEq/L (ref 3.5–5.1)
Sodium: 134 mEq/L — ABNORMAL LOW (ref 135–145)
Sodium: 138 mEq/L (ref 135–145)
Sodium: 138 mEq/L (ref 135–145)

## 2010-07-15 LAB — COMPREHENSIVE METABOLIC PANEL
ALT: 104 U/L — ABNORMAL HIGH (ref 0–35)
ALT: 49 U/L — ABNORMAL HIGH (ref 0–35)
ALT: 51 U/L — ABNORMAL HIGH (ref 0–35)
ALT: 62 U/L — ABNORMAL HIGH (ref 0–35)
AST: 168 U/L — ABNORMAL HIGH (ref 0–37)
AST: 80 U/L — ABNORMAL HIGH (ref 0–37)
AST: 85 U/L — ABNORMAL HIGH (ref 0–37)
AST: 89 U/L — ABNORMAL HIGH (ref 0–37)
Albumin: 2.7 g/dL — ABNORMAL LOW (ref 3.5–5.2)
Albumin: 2.9 g/dL — ABNORMAL LOW (ref 3.5–5.2)
Albumin: 3 g/dL — ABNORMAL LOW (ref 3.5–5.2)
Albumin: 3.8 g/dL (ref 3.5–5.2)
Alkaline Phosphatase: 53 U/L (ref 39–117)
Alkaline Phosphatase: 54 U/L (ref 39–117)
Alkaline Phosphatase: 57 U/L (ref 39–117)
Alkaline Phosphatase: 62 U/L (ref 39–117)
BUN: 2 mg/dL — ABNORMAL LOW (ref 6–23)
BUN: 2 mg/dL — ABNORMAL LOW (ref 6–23)
BUN: 3 mg/dL — ABNORMAL LOW (ref 6–23)
BUN: <1 mg/dL — ABNORMAL LOW (ref 6–23)
CO2: 25 mEq/L (ref 19–32)
CO2: 25 mEq/L (ref 19–32)
Calcium: 8.8 mg/dL (ref 8.4–10.5)
Calcium: 8.9 mg/dL (ref 8.4–10.5)
Calcium: 9 mg/dL (ref 8.4–10.5)
Calcium: 9 mg/dL (ref 8.4–10.5)
Chloride: 101 mEq/L (ref 96–112)
Chloride: 101 mEq/L (ref 96–112)
Chloride: 103 mEq/L (ref 96–112)
Creatinine, Ser: 0.58 mg/dL (ref 0.4–1.2)
Creatinine, Ser: 0.62 mg/dL (ref 0.4–1.2)
Creatinine, Ser: 0.66 mg/dL (ref 0.4–1.2)
GFR calc Af Amer: >60 mL/min (ref >60)
GFR calc Af Amer: >60 mL/min (ref >60)
GFR calc Af Amer: >60 mL/min (ref >60)
GFR calc Af Amer: >60 mL/min (ref >60)
GFR calc non Af Amer: >60 mL/min (ref >60)
Glucose, Bld: 105 mg/dL — ABNORMAL HIGH (ref 70–99)
Potassium: 3.5 mEq/L (ref 3.5–5.1)
Potassium: 3.6 mEq/L (ref 3.5–5.1)
Potassium: 3.7 mEq/L (ref 3.5–5.1)
Sodium: 136 mEq/L (ref 135–145)
Sodium: 137 mEq/L (ref 135–145)
Sodium: 140 mEq/L (ref 135–145)
Sodium: 151 mEq/L — ABNORMAL HIGH (ref 135–145)
Total Bilirubin: 0.4 mg/dL (ref 0.3–1.2)
Total Bilirubin: 0.5 mg/dL (ref 0.3–1.2)
Total Bilirubin: 1.2 mg/dL (ref 0.3–1.2)
Total Protein: 6.4 g/dL (ref 6.0–8.3)
Total Protein: 6.5 g/dL (ref 6.0–8.3)
Total Protein: 6.9 g/dL (ref 6.0–8.3)

## 2010-07-15 LAB — URINALYSIS, ROUTINE W REFLEX MICROSCOPIC
Bilirubin Urine: NEGATIVE
Glucose, UA: NEGATIVE mg/dL
Glucose, UA: NEGATIVE mg/dL
Hgb urine dipstick: NEGATIVE
Ketones, ur: NEGATIVE mg/dL
Nitrite: NEGATIVE
Protein, ur: 30 mg/dL — AB
Protein, ur: NEGATIVE mg/dL
Specific Gravity, Urine: 1.013 (ref 1.005–1.030)
Urobilinogen, UA: 0.2 mg/dL (ref 0.0–1.0)
pH: 5.5 (ref 5.0–8.0)
pH: 6.5 (ref 5.0–8.0)

## 2010-07-15 LAB — DIFFERENTIAL
Basophils Absolute: 0 K/uL (ref 0.0–0.1)
Basophils Relative: 0 % (ref 0–1)
Eosinophils Absolute: 0.1 10*3/uL (ref 0.0–0.7)
Eosinophils Relative: 2 % (ref 0–5)
Lymphocytes Relative: 38 % (ref 12–46)
Lymphs Abs: 1.5 10*3/uL (ref 0.7–4.0)
Monocytes Absolute: 0.3 10*3/uL (ref 0.1–1.0)
Monocytes Relative: 8 % (ref 3–12)
Neutro Abs: 2.1 K/uL (ref 1.7–7.7)
Neutrophils Relative %: 52 % (ref 43–77)

## 2010-07-15 LAB — CBC
HCT: 30.5 % — ABNORMAL LOW (ref 36.0–46.0)
HCT: 30.7 % — ABNORMAL LOW (ref 36.0–46.0)
HCT: 31.5 % — ABNORMAL LOW (ref 36.0–46.0)
HCT: 31.9 % — ABNORMAL LOW (ref 36.0–46.0)
HCT: 32.6 % — ABNORMAL LOW (ref 36.0–46.0)
HCT: 41.4 % (ref 36.0–46.0)
Hemoglobin: 10.5 g/dL — ABNORMAL LOW (ref 12.0–15.0)
Hemoglobin: 10.7 g/dL — ABNORMAL LOW (ref 12.0–15.0)
Hemoglobin: 10.9 g/dL — ABNORMAL LOW (ref 12.0–15.0)
Hemoglobin: 11 g/dL — ABNORMAL LOW (ref 12.0–15.0)
Hemoglobin: 13.8 g/dL (ref 12.0–15.0)
MCHC: 33.1 g/dL (ref 30.0–36.0)
MCHC: 33.3 g/dL (ref 30.0–36.0)
MCHC: 33.9 g/dL (ref 30.0–36.0)
MCV: 91.9 fL (ref 78.0–100.0)
MCV: 92 fL (ref 78.0–100.0)
MCV: 92.1 fL (ref 78.0–100.0)
MCV: 92.2 fL (ref 78.0–100.0)
MCV: 92.2 fL (ref 78.0–100.0)
Platelets: 104 10*3/uL — ABNORMAL LOW (ref 150–400)
Platelets: 110 10*3/uL — ABNORMAL LOW (ref 150–400)
Platelets: 124 10*3/uL — ABNORMAL LOW (ref 150–400)
Platelets: 127 10*3/uL — ABNORMAL LOW (ref 150–400)
Platelets: 68 10*3/uL — ABNORMAL LOW (ref 150–400)
RBC: 3.63 MIL/uL — ABNORMAL LOW (ref 3.87–5.11)
RBC: 4.5 MIL/uL (ref 3.87–5.11)
RDW: 17.1 % — ABNORMAL HIGH (ref 11.5–15.5)
RDW: 17.4 % — ABNORMAL HIGH (ref 11.5–15.5)
RDW: 17.5 % — ABNORMAL HIGH (ref 11.5–15.5)
RDW: 17.8 % — ABNORMAL HIGH (ref 11.5–15.5)
RDW: 17.9 % — ABNORMAL HIGH (ref 11.5–15.5)
RDW: 18.2 % — ABNORMAL HIGH (ref 11.5–15.5)
WBC: 2.6 10*3/uL — ABNORMAL LOW (ref 4.0–10.5)
WBC: 2.7 10*3/uL — ABNORMAL LOW (ref 4.0–10.5)
WBC: 2.9 10*3/uL — ABNORMAL LOW (ref 4.0–10.5)
WBC: 3.8 10*3/uL — ABNORMAL LOW (ref 4.0–10.5)
WBC: 4 10*3/uL (ref 4.0–10.5)
WBC: 4.4 10*3/uL (ref 4.0–10.5)

## 2010-07-15 LAB — COMPREHENSIVE METABOLIC PANEL WITH GFR
Alkaline Phosphatase: 68 U/L (ref 39–117)
BUN: 3 mg/dL — ABNORMAL LOW (ref 6–23)
Chloride: 116 meq/L — ABNORMAL HIGH (ref 96–112)
GFR calc non Af Amer: >60 mL/min (ref >60)
Glucose, Bld: 100 mg/dL — ABNORMAL HIGH (ref 70–99)
Potassium: 3.8 meq/L (ref 3.5–5.1)
Total Bilirubin: 0.6 mg/dL (ref 0.3–1.2)
Total Protein: 8.4 g/dL — ABNORMAL HIGH (ref 6.0–8.3)

## 2010-07-15 LAB — URINE CULTURE
Colony Count: 40000
Colony Count: 6000

## 2010-07-15 LAB — POCT CARDIAC MARKERS
CKMB, poc: <1 ng/mL — ABNORMAL LOW (ref 1.0–8.0)
Myoglobin, poc: 134 ng/mL (ref 12–200)
Troponin i, poc: <0.05 ng/mL (ref 0.00–0.09)

## 2010-07-15 LAB — RAPID URINE DRUG SCREEN, HOSP PERFORMED
Amphetamines: NOT DETECTED
Tetrahydrocannabinol: NOT DETECTED

## 2010-07-15 LAB — PROTIME-INR
INR: 1.26 (ref 0.00–1.49)
Prothrombin Time: 15.7 s — ABNORMAL HIGH (ref 11.6–15.2)

## 2010-07-15 LAB — VITAMIN B12: Vitamin B-12: 959 pg/mL — ABNORMAL HIGH (ref 211–911)

## 2010-07-15 LAB — SAMPLE TO BLOOD BANK

## 2010-07-15 LAB — RPR: RPR Ser Ql: NONREACTIVE

## 2010-07-15 LAB — ETHANOL: Alcohol, Ethyl (B): 240 mg/dL — ABNORMAL HIGH (ref 0–10)

## 2010-07-15 LAB — APTT: aPTT: 30 seconds (ref 24–37)

## 2010-07-15 LAB — GLUCOSE, CAPILLARY: Glucose-Capillary: 144 mg/dL — ABNORMAL HIGH (ref 70–99)

## 2010-07-15 LAB — AMMONIA: Ammonia: 33 umol/L (ref 11–35)

## 2010-07-15 LAB — LIPASE, BLOOD: Lipase: 39 U/L (ref 11–59)

## 2010-07-30 LAB — DIFFERENTIAL
Eosinophils Absolute: 0 10*3/uL (ref 0.0–0.7)
Lymphocytes Relative: 45 % (ref 12–46)
Lymphs Abs: 1.8 10*3/uL (ref 0.7–4.0)
Monocytes Relative: 14 % — ABNORMAL HIGH (ref 3–12)
Neutro Abs: 1.5 10*3/uL — ABNORMAL LOW (ref 1.7–7.7)
Neutrophils Relative %: 39 % — ABNORMAL LOW (ref 43–77)

## 2010-07-30 LAB — CBC
Hemoglobin: 15.7 g/dL — ABNORMAL HIGH (ref 12.0–15.0)
MCHC: 34.3 g/dL (ref 30.0–36.0)
MCV: 104.9 fL — ABNORMAL HIGH (ref 78.0–100.0)
RBC: 4.36 MIL/uL (ref 3.87–5.11)
RDW: 14.4 % (ref 11.5–15.5)

## 2010-07-30 LAB — ABO/RH: ABO/RH(D): A POS

## 2010-07-30 LAB — COMPREHENSIVE METABOLIC PANEL
CO2: 25 mEq/L (ref 19–32)
Calcium: 9.5 mg/dL (ref 8.4–10.5)
Creatinine, Ser: 0.65 mg/dL (ref 0.4–1.2)
GFR calc non Af Amer: >60 mL/min (ref >60)
Glucose, Bld: 115 mg/dL — ABNORMAL HIGH (ref 70–99)
Total Protein: 7.8 g/dL (ref 6.0–8.3)

## 2010-07-30 LAB — TYPE AND SCREEN
ABO/RH(D): A POS
Antibody Screen: NEGATIVE

## 2010-07-30 LAB — LIPASE, BLOOD: Lipase: 35 U/L (ref 11–59)
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
## 2010-09-04 NOTE — Consult Note (Signed)
NAMEJOSEFA, SYRACUSE                 ACCOUNT NO.:  [id number]   MEDICAL RECORD NO.:  [id number]          PATIENT TYPE:  INP   LOCATION:  1408                         FACILITY:  [facility]   PHYSICIAN:  Ollen Gross. Vernell Morgans, M.D. DATE OF BIRTH:  Aug 27, 1965   DATE OF CONSULTATION:  01/03/2007  DATE OF DISCHARGE:                                 CONSULTATION   Ms. Vanschaick is a 45 year old, white female, who has a known history for  the last few years of gallstones.  She also has hepatitis C and is a  known alcohol abuser. She presents to the emergency department today  with epigastric pain and right upper quadrant pain.  She states that the  pain has been going on for the last few months.  It seems to be worse  over the last few days.  She has not run a fever.  She has a normal  white count.  She has been drinking alcohol today and smells of alcohol.  She had an ultrasound done here that showed stones in her gallbladder  and some mild gallbladder wall thickening.   PAST MEDICAL HISTORY:  1. Chronic pancreatitis.  2. Hepatitis C.  3. Gallstones.  4. Alcohol abuse.   PAST SURGICAL HISTORY:  1. A C-section.  2. Laparoscopic appendectomy.   MEDICATIONS:  None.   ALLERGIES:  None.   SOCIAL HISTORY:  She is a cocaine abuser.  She is a heavy drinker.  She  smokes quite a bit.   FAMILY HISTORY:  Noncontributory.   PHYSICAL EXAMINATION:  VITAL SIGNS:  Her temp is 99.1, blood pressure  105/65, pulse of 101.  GENERAL:  She is a white female, who smells strongly of alcohol, who  slurs her words a little bit.  SKIN:  Warm and dry with no jaundice.  EYES:  Her extraocular muscles are intact.  Pupils equal, round, and  reactive to light.  Sclerae are not icteric.  LUNGS:  Clear bilaterally with no use of accessory respiratory muscles.  HEART:  Regular rate and rhythm with an impulse in the left chest.  ABDOMEN:  Soft.  She has some mild to moderate right upper quadrant  tenderness.  She has an  enlarged liver.  No peritonitis.  EXTREMITIES:  No cyanosis, clubbing, or edema with good strength in her  arms and legs.  PSYCHOLOGICAL:  She is alert and oriented x3 with no evidence today of  anxiety or depression.   REVIEW OF LABORATORY DATA:  Her SGOT/SGPT were elevated, total bili was  normal, white count was normal.   ASSESSMENT AND PLAN:  This is a 45 year old, white female with possible  gallstones and possible early cholecystitis, although it is not clear  whether the thickening of her gallbladder wall is from cholecystitis or  chronic hepatitis and pancreatitis.  She needs medical admission to the  hospital and detoxification.  She may benefit at some point from a  cholecystectomy when she is medically stable.  We will continue to  follow her with the medical team.      Ollen Gross. Vernell Morgans, M.D.  Electronically Signed     PST/MEDQ  D:  01/03/2007  T:  01/03/2007  Job:  [id number]

## 2010-09-04 NOTE — H&P (Signed)
[name], [name]                 ACCOUNT NO.:  [id number]   MEDICAL RECORD NO.:  [id number]          PATIENT TYPE:  INP   LOCATION:  0102                         FACILITY:  [facility]   PHYSICIAN:  Thora Lance, M.D.  DATE OF BIRTH:  June 10, 1965   DATE OF ADMISSION:  01/02/2007  DATE OF DISCHARGE:                              HISTORY & PHYSICAL   CHIEF COMPLAINT:  Right upper quadrant pain.   PRIMARY PHYSICIAN:  Corwin Levins, M.D.   HISTORY OF PRESENT ILLNESS:  Ms. [name] is a 45 year old white female  with a history of alcohol abuse, chronic hepatitis C, pancreatitis in  January of 2006 and GERD who presents with right upper quadrant pain.  She has had 3-4 months of intermittent pain in her right upper quadrant.  It has worsened in the last 24 hours. This becomes quite sharp and  severe. She describes it as constant and burning. She has been nauseated  but not vomiting. The patient admits to drinking at least 4-5 drinks a  day for some time, unable to specify. She has had a history of alcohol  withdrawal when admitted to the hospital in January of 2006 and also was  admitted for voluntary detox in December of 2007.   PAST MEDICAL HISTORY:  1. Chronic hepatitis C.  2. Alcohol abuse.  3. Pancreatitis January of 2006.  4. GERD.   SURGICAL:  1. Cesarean section.  2. Appendectomy 2006.   ALLERGIES:  BETADINE.   MEDICATIONS:  Nexium 40 mg a day. The patient currently not taking.   FAMILY HISTORY:  Mother ovarian cancer and died of stomach cancer.  Father diabetes.   SOCIAL HISTORY:  She admits to drinking 4-5 drinks a day. Has been  documented to drink up to a pint of liquor a day in the past. She is  married. She has 3 children. Has a history of a DUI in 2005. Smoking  light.   REVIEW OF SYSTEMS:  Reviewed and otherwise negative.   PHYSICAL EXAMINATION:  GENERAL:  Ill-appearing white female.  VITAL SIGNS:  At presentation, temperature 99.1, blood pressure 105/65,  heart  rate 101, respirations 20, oxygen saturation 95% on room air.  HEENT:  Pupils are equal, round, and reactive to light. Extraocular  movements are intact. Anicteric. Ears:  TMs are clear. Oropharynx moist.  Mucous membranes clear.  NECK:  Supple. There is no lymphadenopathy. No thyromegaly or bruits.  LUNGS:  Are clear.  HEART:  Regular rate and rhythm without murmurs, rubs, or gallops.  ABDOMEN:  Soft. She has tenderness over the right upper quadrant and  also in the right lower chest wall over the liver. Murphy sign is  negative. There are normal bowel sounds. No masses.  EXTREMITIES:  No edema.  NEUROLOGICAL:  Nonfocal.   LABORATORY DATA:  Ethanol level 108, lipase 49, amylase 50. Sodium 145,  potassium 3.7, chloride 109, bicarbonate 26, BUN 0, creatinine 0.56,  glucose 93. Total bilirubin 0.9, alkaline phosphatase 87, SGOT 271, SGPT  197, albumin 3.3, calcium 9.1. CBC:  WBC 4, hemoglobin 10.8, platelet  count 236.  Urine pregnancy test is negative.   Abdominal ultrasound shows cholelithiasis with possible evidence of  acute cholecystitis.   ASSESSMENT:  1. Right upper quadrant pain. Differential includes cholelithiasis,      gastritis, alcoholic hepatitis, other.  2. Cholecystitis. Rule out early cholecystitis.  3. Alcohol abuse, at risk for withdrawal.  4. Gastroesophageal reflux disease.  5. Hepatitis C.   PLAN:  Admit. IV Rocephin. PTI. Alcohol withdrawal lorazepam protocol.  IV fluids. Pain control. General surgery to follow regarding possible  cholecystitis.           ______________________________  Thora Lance, M.D.     JJG/MEDQ  D:  01/03/2007  T:  01/03/2007  Job:  [id number]   cc:   Corwin Levins, MD  [address]  [place]  [place] [address]

## 2010-09-04 NOTE — Discharge Summary (Signed)
NAMEJOSCELYNE, [name]                 ACCOUNT NO.:  [id number]   MEDICAL RECORD NO.:  [id number]          PATIENT TYPE:  INP   LOCATION:  1408                         FACILITY:  [facility]   PHYSICIAN:  Rosalyn Gess. Norins, MD  DATE OF BIRTH:  May 03, 1965   DATE OF ADMISSION:  01/02/2007  DATE OF DISCHARGE:                               DISCHARGE SUMMARY   ADMITTING DIAGNOSES:  1. Abdominal pain.  2. Alcohol intoxication.   DISCHARGE DIAGNOSES:  1. Abdominal pain, possibly related to dysfunctional gallbladder with      known cholelithiasis.  2. Alcohol abuse with the patient successfully detoxed.  3. Hepatitis C.  4. Gastroesophageal reflux disease.  5. Chronic constipation.   CONSULTANTS:  1. Dr. Wendall Papa for gastroenterology.  2. Dr. Chevis Pretty for general surgery.   PROCEDURES:  1. Ultrasound of the abdomen performed on 9/13, read as slight      cholelithiasis with evidence of early acute cholecystitis as noted      above.  2. Chest x-ray 9/13, showed no acute abnormalities.  3. Hepatobiliary scan read as a normal examination with patency of the      cystic duct and a normal gallbladder ejection fraction of 84%.      Comment from radiologist was the patient did have gallstones and      gallbladder wall thickening on ultrasound that could be related to      the patient's  hepatitis.  4. KUB abdomen, 01/03/07, read out as normal bowel gas patterns with no      acute findings.   HISTORY OF PRESENT ILLNESS:  The patient is a 45 year old Caucasian  female with a known history of gallstones. She also has known hepatitis  C and is known to continue to abuse alcohol. The patient presented to  the emergency department on the day of admission complaining of  epigastric and right upper quadrant abdominal pain. She reports this has  been present for a few months but has accelerated. She reports no fever.  In the ER she has a normal white count. The patient did have the smell  of alcohol  about her and did have an alcohol level of 108.   Past medical history, family history and social history are well  documented in the admit note.   Physical exam on admission significant for a temperature of 99.1. Blood  pressure was stable at 105/65, heart rate was 101. Examination was  unremarkable except the abdomen which had tenderness over the right  upper quadrant and the right lower chest wall.   ADMISSION LABORATORIES:  Urine pregnancy was negative. CBC was a white  count of 4000, hemoglobin 10.8 grams, hematocrit 32.4%. Comprehensive  metabolic panel with a glucose of 93, creatinine of 0.56, sodium was  3.7, amylase was 50, lipase was 49, EtOH was 108. INR was 1.1, PTT was  29. Magnesium was 1.6.   Urinalysis was negative.   HOSPITAL COURSE:  1. Alcohol detox. The patient was admitted and put on a detox protocol      using Ativan, given thiamine and multivitamins. On  this regimen she      did well. She never had significant symptoms of withdrawal. She      never had any hallucinosis. She definitely had no tremor or      neurologic changes. She was able to be weaned off the Ativan on a      p.r.n. basis only.   Alcohol disease was discussed with the patient. She is adamantly  encouraged to join AA for long-term abstinence.   1. GI. The patient had significant abdominal pain and discomfort. She      had gallstones on admission as noted along with symptoms and signs      that were suggestive of cholecystitis. Medically she had pain, but      she had a normal white count. She had no significant fever and no      clinical sign of acute infection. The patient was seen in      consultation by Dr. Chevis Pretty. He did not feel the patient needed      urgent surgery. A hepatobiliary scan was obtained which did show      normal function, patent cystic duct. Dr. Carolynne Edouard felt that the      patient, given her very high risk given her hepatitis C, would be      [facility], if she should require      surgery, where complications could be better managed.   The patient was seen in consultation by GI service, Dr. Gladys Damme. He  was concerned that the patient had active cholecystitis and  cholelithiasis. In discussions with Dr. Carolynne Edouard they determined the patient  would be best served by being seen and evaluated at [facility]  where she was already scheduled for Monday, 9/22nd.   The patient had ongoing pain and discomfort but this was manageable, and  she seemed to be comfortable. Followup laboratories 9/15th showed a  white count of 2800, hemoglobin 9.7. Final CBC 9/18, was a white count  of 3,100, a hemoglobin of 10.4 grams. The patient's  pain is being  managed with medication and it was felt that she would be stable to be  discharged home knowing that she has a followup that is very timely on  Monday the 22nd a the GI clinic at [facility].   1. Hepatitis. Patient with known hepatitis C. The patient had an HCB      RNA quant which revealed a count of 760,000 with log count of 5.88      consistent with acute hepatitis C. The patient was jaundiced and      liver function study from 9/18, revealed an SGOT of 188, SGPT of      138, alkaline phosphatase was 69, bilirubin was 0.7. The patient is      scheduled to be seen at GI clinic at [facility] who will work with      the patient in regards to her potential for treatment although she      understands she has to be abstemious from alcohol.   1. Anemia. The patient did have a persistent anemia. Studies were      obtained including a folate level that was elevated at 753, serum      ferritin was 19, that is normal range, B12 was normal at 1334,      total iron-binding capacity was 299 with iron of 20 and iron %  saturation of 7%. The patient had no obvious source of bleeding.      The patient had no obvious source of bleeding. It was suspected      this may be  anemia of chronic disease including bone marrow      suppression from alcohol use.   PLAN:  The plan will be to have the patient continue on iron replacement  therapy. She does not seem to be at this time in need of transfusions.   DISCHARGE EXAMINATION:  Temperature was 98.4, blood pressure 119/79,  pulse 65, respirations 18, 02 sats 95% on room air. General appearance,  this is a well-nourished, bronzed Caucasian female in no acute distress.  HEENT exam revealed sclerae to be clear without signs of icterus. Chest  was clear with no rales, wheezes or rhonchi. Cardiovascular had 2+  radial pulses. The precordium was quiet. She had a regular rate and  rhythm. Abdomen showed the patient to have positive bowel sounds in all  4 quadrants. There was no guarding, there was no rebound. She had  tenderness in the epigastrium and right upper quadrant. There was no  palpable gallbladder bulb. Rectal and genitalia exams were deferred.  Extremities were without clubbing, cyanosis or edema. No deformities  were noted.   DISPOSITION:  The patient is discharged home on the morning of the 20th.  She is to continue medications as noted below. She is to be seen at  [facility] on Monday the 22nd and it is imperative that she keep this  appointment. The patient is also to investigate participation in [place].   DISCHARGE MEDICATIONS:  1. Multivitamin daily.  2. Folic acid 1 mg daily.  3. Thiamine 100 mg daily.  4. Protonix 40 mg q.a.m.  5. Milk of Magnesia 30 mL p.r.n.  6. Ceftin 250 mg orally b.i.d.  7. Ativan 1 mg q. 4 p.r.n., limited supply only.  8. Oxycodone 5 mg to take q. 6 h, limited supply only.  9. Phenergan 25 mg one-half of 1 tablet p.r.n., nausea and vomiting.   CONDITION ON DISCHARGE:  The patient's  condition at the time of this  discharge dictation is stable. At the time of discharge she will be  provided copies of her H and P, consult note and this discharge summary.  She knows that  she is to go to [facility] on Monday the 22nd.      Rosalyn Gess Norins, MD  Electronically Signed     MEN/MEDQ  D:  01/09/2007  T:  01/09/2007  Job:  [id number]   cc:   Ollen Gross. Vernell Morgans, M.D.  [address], [address]  [place] [address]   Rachael Fee, MD  [address]  [place], [place] [address]

## 2010-09-07 NOTE — H&P (Signed)
[name], [name]                 ACCOUNT NO.:  [id number]   MEDICAL RECORD NO.:  [id number]          PATIENT TYPE:  EMS   LOCATION:  MAJO                         FACILITY:  [facility]   PHYSICIAN:  Barbette Hair. Artist Pais, DO      DATE OF BIRTH:  09/26/1962   DATE OF ADMISSION:  04/27/2006  DATE OF DISCHARGE:                              HISTORY & PHYSICAL   PRIMARY CARE PHYSICIAN:  Oliver Barre, MD   CHIEF COMPLAINT:  Unresponsiveness, alcohol intoxication with right-  sided chest pain/abdominal pain.   HISTORY OF PRESENT ILLNESS:  The patient is a 45 year old white female  with past medical history of hepatitis C and chronic alcoholism, brought  in by EMS secondary to unresponsiveness.  The patient's history is per  EMS and ER staff, patient noted to have been drinking yesterday, found  unresponsive by her husband and experienced transient aphasia; this has  resolved on its own.  There were no other neurologic symptoms, no report  of any seizure activity.  The patient complains also of right-sided  chest discomfort/right upper quadrant abdominal pain, worse with  inspiration.  The patient had a cardiac enzyme that was drawn in the ER.  At that point of care, it was elevated at 4.61; however, subsequent  troponin within an hour and a half was relatively normal at 0.10.  The  patient is still complaining of some discomfort in her right lower  chest/right upper quadrant.  Review of her medical records notes that  she has had a history of gallstones.  She has not had a cholecystectomy  in the past.   A UDS was also performed by ER staff, which was positive for cocaine and  benzodiazepine.  The patient denies any recreational drug use.   PAST MEDICAL HISTORY:  1. Chronic hepatitis C.  2. Alcoholism.  3. Gastroesophageal reflux disease.  4. History of gallstones.  5. Depression.  6. Status post appendectomy.  7. History of alcoholic pancreatitis.   SOCIAL HISTORY:  She lives with her  husband, has 3 daughters, currently  is not working.  Alcohol use:  She admits to drinking at least 5-6  drinks of vodka yesterday.   FAMILY HISTORY:  Mother is known to have ovarian cancer.  Father is  diabetic.   LABORATORY DATA:  CBC showed WBC of 4.4, H&H of 12.6 and 37.1, platelets  of 218,000.  Comprehensive metabolic profile notable for BUN 6,  creatinine 1.0, blood glucose of 86.  AST was elevated at 119, ALT 105.  Lipase was normal at 31, alkaline phosphatase 55.  Troponin, as noted  above, initial value point of care 4.61, next value an hour and a half  later 0.10.  UDS positive for cocaine and benzodiazepine.  Alcohol level  261.   X-RAY DATA:  The patient had a CAT scan of the head, which was negative,  and a chest x-ray, which did not show any active infiltrates or airspace  disease.   PHYSICAL EXAM:  VITALS:  Temperature is 97.1, BP is 117/80, pulse is 80,  respirations 20 and patient was  94% on room air.  GENERAL:  The patient is a disheveled 45 year old white female who  appears older than stated age, somnolent, but follows commands.  HEENT:  Pupils were equal and reactive to light, but sluggish,  anicteric.  Mucous membranes were moist.  NECK:  Supple.  No adenopathy or carotid bruit.  CHEST:  Normal inspiratory effort, clear to auscultation bilaterally, no  rhonchi, rales or wheezing.  CARDIOVASCULAR:  Regular rate and rhythm.  No significant murmurs, rubs  or gallops appreciated.  Heart sounds were somewhat distant.  ABDOMEN:  Soft.  The patient has right upper quadrant tenderness, no  rebound or guarding.  EXTREMITIES:  No clubbing, cyanosis, or edema.  NEUROLOGIC:  Cranial nerves II-XII were grossly intact.  She was  nonfocal.   IMPRESSION:  1. Acute alcohol intoxication with transient aphasia.  2. Right upper quadrant pain with history of gallstones.  3. Atypical chest pain with elevated troponins, likely laboratory      error.  4. Chronic alcohol  abuse.  5. Cocaine abuse.  6. History of hepatitis C.   RECOMMENDATIONS:  The patient will be admitted for 24- to 48-hour  observation and to monitor for DTs.  We will cycle 2 more sets of  cardiac enzymes.  The patient certainly is at high risk for coronary  vasospasm secondary to cocaine use and a 2-D echo will be obtained.  I  will also order a right upper quadrant ultrasound with history of  gallstones and right upper quadrant pain.   The patient will be placed on Ativan around the clock and p.r.n. and  psychiatric consult should be considered.  The patient has been advised  numerous times in the past to abstain from alcohol.      Barbette Hair. Artist Pais, DO  Electronically Signed     RDY/MEDQ  D:  04/28/2006  T:  04/28/2006  Job:  [id number]   cc:   Corwin Levins, MD

## 2010-09-07 NOTE — Consult Note (Signed)
[name], [name]                 ACCOUNT NO.:  [id number]   MEDICAL RECORD NO.:  [id number]          PATIENT TYPE:  INP   LOCATION:  0465                         FACILITY:  [facility]   PHYSICIAN:  Sandria Bales. Ezzard Standing, M.D.  DATE OF BIRTH:  Dec 21, 1965   DATE OF CONSULTATION:  04/26/2004  DATE OF DISCHARGE:                                   CONSULTATION   REASON FOR CONSULTATION:  Early appendicitis.   HISTORY OF PRESENT ILLNESS:  [name] is a 45 year old white female who is  admitted to [facility] on April 23, 2004 with a history of  primary care physician Dr. Windle Guard.  She has had fairly significant  alcohol use and presented to the emergency room with a three-week history of  worsening epigastric pain, nausea and vomiting.  She had had some emesis  with some specks of blood but no coffee ground material.  She had no prior  history of pancreatitis or hepatitis that she knew about.   Her hospital course has been that she was admitted, placed on IV fluids.  First, she did test positive for anti HCV suggesting she has hepatitis C.  Secondly, on admission she had elevated liver functions with an AST of 351  and ALT of 324.  Alk phos was normal at 76.  Her total bilirubin was normal  at 0.7.  She did, on admission, have a lipase of 122 but a normal white  blood count of 4400.   PAST MEDICAL HISTORY:  She is on no medications.  She has no allergies.   REVIEW OF SYSTEMS:  NEUROLOGIC:  She has no history of seizures or loss of  consciousness.    PULMONARY:  She smokes cigarettes.  Knows this is bad for her health.    CARDIAC:  No heart disease or chest pain.    GASTROINTESTINAL:  See history of present illness.  Her prior abdominal  surgery, I think, includes a cesarean section, at least one, and a tubal  ligation.   SOCIAL HISTORY:  From a social standpoint, __________ was in the chart.  She  has a history of drinking liquor on a daily basis.  She has a history of a  recent DUI and is court-ordered to attend alcohol support group.   Her mother and daughter were in the room when I interviewed her.   PHYSICAL EXAMINATION:  VITAL SIGNS: Blood pressure 146/85, heart rate 77,  temperature 99.1.  GENERAL:  She has a whole body tan.  Is alert and cooperative.  NECK:  Supple.  I feel no mass or thyromegaly.  LUNGS:  Clear to auscultation with some mild crackles in both bases.  HEART:  Regular rate and rhythm without murmur or rub.  ABDOMEN:  Right-sided abdominal tenderness.  She may have a little bit of  guarding.  I do no think she has any rebound.  She has decreased percussive  bowel sounds.  She has a well-healed infraumbilical scar, suprapubic scar.  EXTREMITIES: She is tan without any tan lines.   LABORATORY DATA:  Her most recent labs show  a white blood cell count of 3500  from January 4, with a hemoglobin of 13, hematocrit 37.  Her sodium is 136,  potassium 4.2, chloride 105, BUN 1, creatinine 0.7, glucose 118.  Again, as  previously mentioned her AST was 255, ALT 279.   I reviewed the CT scan which does appear to show a mildly distended  appendix, but it is really questionable if there is any appendiceal  stranding.  I think with her other problems, it is really going to be hard  to pick out whether this is an early appendicitis or not.  I think that she  probably has some mild pancreatitis.  There is really no significant  inflammation on the CT scan of her pancreas.  She probably has hepatitis  both secondary to hepatitis C and alcohol use.  Now she is having this  abdominal pain which has migrated to the right side of her abdomen.   DIAGNOSES:  1.  Possible early appendicitis.  Discussed the options with the patient      which include proceeding with surgery at this time with  laparoscopic      evaluation and appendectomy.  Other options include repeating a CT scan      either tonight or tomorrow and serial physical examinations and serial       white blood counts.  The patient was interested in going ahead with      surgery at this time.  I discussed with her and her mother who was in      the room and her daughter the indications, potential complications of      laparoscopy and appendectomy.  These include, but are not limited to      bleeding, infection, need for open surgery and also identifying another      cause of her abdominal pain.  There is the possiblity that her appendix      is not the source of her pain.   1.  Recent hospitalization for pancreatitis related to alcohol abuse.   1.  Alcohol abuse.  She has been told repeatedly by family and physicians      that she needs to address her drinking problems.   1.  Recently diagnosed hepatitis C.     Davi   DHN/MEDQ  D:  04/26/2004  T:  04/26/2004  Job:  [id number]   cc:   Deirdre Peer. Polite, M.D.   Windle Guard, M.D.  [address]  [place], [place] [address]  Fax: [phone]

## 2010-09-07 NOTE — Discharge Summary (Signed)
NAMESHACOYA, BURKHAMMER               ACCOUNT NO.:  [id number]   MEDICAL RECORD NO.:  [id number]          PATIENT TYPE:  IPS   LOCATION:  0303                          [facility]ILITY[facility]  [facility]   PHYSICIAN:  Anselm Jungling, MD  DATE OF BIRTH:  1965-08-03   DATE OF ADMISSION:  12/22/2006  DATE OF DISCHARGE:  12/25/2006                               DISCHARGE SUMMARY   IDENTIFYING DATA AND REASON FOR ADMISSION:  This was an inpatient  psychiatric admission for Jarrett, a 45 year old white female,  admitted  for alcohol detoxification.  She was admitted here 10 months ago for the  same purpose.  Her last alcoholic drink was the day prior to admission.  She came to [place] with a history of hepatitis C and pancreatitis.  Please  refer to the admission note for further details pertaining to the  symptoms, circumstances, and history that led to her hospitalization.  She was given initial Axis-I diagnosis of alcohol dependence, and  alcohol withdrawal.   LABORATORY:  The patient was medically and physically assessed by the  psychiatric nurse practitioner.  She appeared to be in good health  without any active or chronic medical problems.  She did complain of  epigastric pain, and there was some question as to whether this was  pancreatic in origin.  Serum amylase and lipase were ordered, which were  still pending at the time of this discharge.  The patient was treated  with Protonix 40 mg b.i.d.  Her epigastric discomfort subsided over the  course of her stay.  Appetite was normal.   HOSPITAL COURSE:  The patient was admitted to the adult inpatient  psychiatric service.  She presented as a well-nourished, well-developed,  ill-appearing, older-appearing woman who was tired and dysphoric, but  fully oriented.  There were no signs or symptoms of psychosis or  delirium.  She verbalized a strong desire for help.   She was involved in the therapeutic milieu including 12-step groups and  other groups geared  towards developing further insight and coping  skills.  There was a family session on the third hospital day involving  the patient and her mother.  It was the impression of the family  counselor, and apparently her mother as well, that the patient was  somewhat minimizing about her addiction to alcohol and the need to  develop a strong program to resist the possibility of alcohol relapse.  The patient gave lip service to going to Alcoholics Anonymous in the  community following her discharge.   On the fourth hospital day, the patient appeared to have completed the  detoxification process that had involved tapering doses of Librium.  She  was strongly desirous of discharge.  There did not appear to be any  further indication for inpatient treatment.  She agreed to the following  aftercare plan.   AFTERCARE:  The patient was to follow up at the [facility] with a  walk-in appointment, at their facility in [place], [place].   DISCHARGE MEDICATIONS:  Protonix 40 mg twice daily.   The patient was encouraged to attend AA meetings  daily and to get an AA  sponsor right away.   DISCHARGE DIAGNOSES:  AXIS I:  Alcohol dependence, early remission.  AXIS II:  Deferred.  AXIS III:  A history of hepatitis C, pancreatitis.  AXIS IV:  Stressors severe.  AXIS V:  Global assessment of functioning on discharge 55.      Anselm Jungling, MD  Electronically Signed     SPB/MEDQ  D:  12/26/2006  T:  12/26/2006  Job:  [id number]

## 2010-09-07 NOTE — Discharge Summary (Signed)
[name], Meredith                 ACCOUNT NO.:  [id number]   MEDICAL RECORD NO.:  [id number]          PATIENT TYPE:  INP   LOCATION:  0465                         FACILITY:  [facility]   PHYSICIAN:  Deirdre Peer. Polite, M.D. DATE OF BIRTH:  [birth year]   DATE OF ADMISSION:  04/23/2004  DATE OF DISCHARGE:                                 DISCHARGE SUMMARY   DISCHARGE DIAGNOSES:  1.  ETOH abuse with withdrawal symptoms.  Please note, no withdrawal      symptoms at discharge.  2.  Abnormal CT showing early appendicitis.  Patient is now status post      appendectomy.  Per surgical report probable normal appendix.  3.  Positive hepatitis C antibody.  Confirmatory studies have been ordered.      Will need to be followed up by primary M.D.   Kendell Bane MEDICATIONS:  Vicodin as needed for pain.   FOLLOWUP:  1.  Patient is asked to follow up with Dr. Ezzard Standing, general surgery, for      surgical follow-up.  2.  Patient is asked to follow up with Dr. Windle Guard for further      evaluation hepatitis C antibody.   STUDIES:  Patient had CAT scan of the abdomen and pelvis which showed  probable early appendicitis.  Please note, otherwise CT is within normal  limits except for benign appearing right ovarian cyst.  CBC on admission  within normal limits except for elevated MCV of 106.5, INR 1.0.  BMET within  normal limits.  LFTs elevated on admission.  AST 351 with ALT of 324.  Follow-up laboratories at discharge:  AST 52, ALT 15.  Patient had hepatitis  panel which was positive for hepatitis C antibody.  Urine drug screen  positive for opiates.  UA within normal limits.   HISTORY OF PRESENT ILLNESS:  A 45 year old female with heavy alcohol use  presented to the ED with complaint of abdominal pain.  In the ED patient was  evaluated.  Found to have elevated lipase of 122 with history of ETOH abuse.  Admission was deemed necessary for pancreatitis.  Please see dictated H&P  for further details.   HOSPITAL COURSE:  Patient was admitted to a medicine floor bed for  evaluation and treatment of abdominal pain presumed secondary to  pancreatitis.  Patient was treated in typical fashion with IV fluids,  analgesia, antiemetics.  Because of the patient's ETOH abuse, the patient  was also given Ativan.  Patient's hospital course was slow to improve.  Patient ultimately was able to take some p.o., however, still complained of  abdominal discomfort particularly in the left lower quadrant.  CT of the  abdomen and pelvis was ordered to rule out any possible pathology, i.e.,  pseudo cysts.  Patient's pancreas actually appeared within normal limits;  however, patient did have questionable early inflammatory changes in her  appendix.  Therefore, surgical consult was obtained.  Patient underwent  appendectomy per surgical report.  Feel that patient's appendix was within  normal limits.  In the interim the patient had been covered with empiric  antibiotics,  IV Unasyn which was stopped 24 hours before surgery.  Patient is tolerating p.o. without signs of alcohol withdrawal at this time.  Patient is medically stable for discharge.  Patient is asked to follow up  with Dr. Ezzard Standing as outlined and also asked to follow up with her primary  M.D. for further evaluation of hepatitis C antibody.      RDP/MEDQ  D:  04/29/2004  T:  04/29/2004  Job:  [id number]   cc:   Windle Guard, M.D.  [address]  [place], [place] [address]  Fax: [phone]

## 2010-09-07 NOTE — Op Note (Signed)
NAMEMADASYN, HEATH                 ACCOUNT NO.:  [id number]   MEDICAL RECORD NO.:  [id number]          PATIENT TYPE:  INP   LOCATION:  0465                         FACILITY:  [facility]   PHYSICIAN:  Sandria Bales. Ezzard Standing, M.D.  DATE OF BIRTH:  1965-06-07   DATE OF PROCEDURE:  04/26/2004  DATE OF DISCHARGE:                                 OPERATIVE REPORT   PREOPERATIVE DIAGNOSIS:  Early appendicitis by CT scan.   POSTOPERATIVE DIAGNOSIS:  Doubt appendicitis, though she did have some intra-  abdominal scarring.   PROCEDURE:  Laparoscopic appendectomy.   SURGEON:  Sandria Bales. Ezzard Standing, M.D.   ANESTHESIA:  General endotracheal.   ESTIMATED BLOOD LOSS:  Minimal.   INDICATIONS FOR PROCEDURE:  Ms. Fehrman is a 45 year old white female admitted  on April 24, 2004 with acute pancreatitis secondary to alcohol abuse. She  had been discovered to have hepatitis C and now developed right-sided  abdominal pain which on CT scan today suggested early appendicitis.   I think the CT scan is equivocal, but with her other complicating factors,  that she would be best served with proceeding with appendectomy, discussed  with the patient and her family.  Thus, the indications, potential  complications to include, but not limited to bleeding, infection, need for  open surgery, another cause for her abdominal pain besides her appendix  which may be seen laparoscopically, and may not be seen laparoscopically.   The patient was on Unasyn, presented to the operating room and underwent  general anesthesia.   DESCRIPTION OF PROCEDURE:  The patient with left arm tucked, right arm to  her side, Foley catheter in place.  Infraumbilical incision was made with  sharp dissection, carried down to the abdominal cavity.  I placed a 10mm  Hasson trocar at her umblicus.  I also placed a 5 mm trocar in the left  lower quadrant and a 5 mm trocar in the right upper quadrant and did a  laparoscopic exploration.   Her uterus was  somewhat large.  I could visualize both ovaries.  It looked  like she has had a prior tubal ligation.  Her right ovary was somewhat  enlarged with a cyst, but certainly nothing significant. She did have a  little bit of free fluid in her peritoneal cavity, but this was not  purulent.  Her liver was unremarkable.  Anterior wall of stomach was mildly  dilated but unremarkable.  Her gallbladder was unremarkable.   Attention was turned to the appendix where she had some scarring around her  cecum.  The appendix was actually retrocecal.  I questioned what they saw on  CT scan was actually her appendix. The appendix was neither distended nor  acutely inflamed. I went ahead and took it out, taking the mesentery down  with the Harmonic Scalpel.  I then fired a vascular load of the Endo-GIA 45  stapler across the base of the appendix and delivered the appendix through  an Endo-catch bag through the umbilicus.   I then continued the exploration of the abdomen, seeing no other source of  inflammation  or infection.  I did run the small bowel back.  She had some  adhesions to the small bowel.  I did not see an obvious Meckel's or any  other small bowel or cecal bowel problems.   I later removed the trocars in turn, both the two 5 mm and the Hasson  trocar.  The umbilical port was closed with 0-Vicryl suture, the skin at  each site was closed with a 4-0 Monocryl suture, painted with tincture of  Benzoin and Steri-Strips.   I am not sure why she is having her abdominal pain.  I doubt the pain is  from her appendix.  I certainly think she has at least 3 potential sources:  1) Some pancreatitis, though her pancreas looked pretty good by CT scan; 2)  Liver changes or hepatitis, either from her hepatitis C which does not  appear to be acute versus her alcohol abuse; 3) A right ovarian cyst on CT  scan, which was a little bit less remarkable to me on my endoscopy.      Davi   DHN/MEDQ  D:   04/26/2004  T:  04/26/2004  Job:  [id number]   cc:   Deirdre Peer. Polite, M.D.   Windle Guard, M.D.  [address]  [place], [place] [address]  Fax: [phone]

## 2010-09-07 NOTE — Discharge Summary (Signed)
NAMEIMOGENE, GRAVELLE                 ACCOUNT NO.:  [id number]   MEDICAL RECORD NO.:  [id number]          PATIENT TYPE:  IPS   LOCATION:  0507                          [facility]ILITY[facility]  [facility]   PHYSICIAN:  Jasmine Pang, M.D. DATE OF BIRTH:  27-Jul-1965   DATE OF ADMISSION:  04/11/2006  DATE OF DISCHARGE:  04/14/2006                               DISCHARGE SUMMARY   IDENTIFYING INFORMATION:  A 45 year old married Caucasian female, who  was admitted on a voluntary basis on April 11, 2006.   HISTORY OF PRESENT ILLNESS:  The patient is here for voluntary alcohol  DETOX.  She states she has been drinking a pint a day for the past 5  years.  She has been drinking since her early 20s.  She states she wants  to get sober secondary to the birth of her grandson, who is now 94-months-  old.  She also needs to start treatment for hepatitis C and cannot do  this and has to have a period of 6 months sobriety.  She has been having  positive tremor.  She wakes up and shakes in the middle of the night  which is relieved by drinking.  There is no suicidal or homicidal  ideation.  No auditory or visual hallucinations.  The patient had a 30-  day inpatient stay at [facility] 15 years ago.  She has successfully abstained  for awhile but did not attend AA meetings and relapsed.  The patient has  hepatitis C, hemochromatosis, and pancreatitis.  She is on Nexium 40 mg  daily.  For further admission information, see psychiatric admission  assessment.   PHYSICAL EXAM:  The patient was medically cleared in the [facility] ED.  She was in no acute distress.  Her laboratories were done in the ED.  CBC was within normal limits.  Liver profile was positive for an  elevated ALT at 78 and increased AST at 89.  Albumin was decreased at  3.4.  TSH was normal at 0.844.  Alcohol level was 299 __________ to 10.  Urinalysis was negative, and the labs were evaluated in the [facility]  ED hospital.   HOSPITAL COURSE:  The patient was  placed on Librium DETOX protocol.  She  was also placed on Protonix 240 mg daily.  She was placed on folic acid  1 mg daily.  On April 11, 2006, she was given 25 mg of Librium now.  The patient tolerated her DETOX well with no significant side effects.  She was very appropriate on the unit.  Upon first meeting her. she  states she came in on a voluntary basis, my daughter had a baby.  She  had been drinking and wanted to get DETOX.  She had been depressed.  There was no suicidal or homicidal ideation.  The patient was started on  Lexapro 10 mg p.o. daily.  The patient continued to tolerate the DETOX  protocol well.  She was very talkative.  She stated she did not want to  be a drunk gram-maw.  She had been drinking daily.  Her husband  was  also but has stopped.  She was sleeping well with the Ambien.  On  April 13, 2006, the patient was excited about going home tomorrow.  She wanted to leave early in the morning so that she could get some  shopping done.  On April 14, 2006, the patient's mental status had  improved markedly from admission.  She was friendly, conversant,  talkative.  She had good eye contact.  Speech was normal rate and flow.  Psychomotor activity was within normal limits.  Her mood was euthymic.  Affect wide range.  No suicidal or homicidal ideation.  No thoughts of  self-injurious behavior.  No auditory or visual hallucinations.  No  paranoia or delusions.  Thoughts were logical and goal-directed.  Thought content no predominant theme other than being happy to be home  for Christmas.  Cognitive was back to baseline.  It was felt the patient  had tolerated her DETOX well, was less depressed, and was able to be  discharged.   DISCHARGE DIAGNOSES:  AXIS I:  Depressive disorder NOS.  AXIS II:  None.  AXIS III:  Hepatitis C, pancreatitis, hemochromatosis.  AXIS IV:  Moderate (other psychosocial problems - alcohol dependence,  medical problems).  AXIS V: GAF upon  discharge was 60.  GAF upon admission was 45.  GAF  highest past year was 70.   DISCHARGE/PLAN:  There were no specific activity level or dietary  restrictions.   DISCHARGE MEDICATIONS:  1. Lexapro 10 mg daily.  2. Protonix 40 mg in the a.m. with food.  3. Ambien 10 mg at bedtime if needed for sleep.   POSTHOSPITAL CARE PLAN:  Alcohol and drug services, Mauro Kaufmann, on  December 26 at 3 p.m., Dr. Tomasa Rand at [facility] on  January 23 at 10:45 a.m.      Jasmine Pang, M.D.  Electronically Signed     BHS/MEDQ  D:  04/14/2006  T:  04/14/2006  Job:  [id number]

## 2010-09-07 NOTE — Consult Note (Signed)
NAMETAYLOR, SPILDE                 ACCOUNT NO.:  [id number]   MEDICAL RECORD NO.:  [id number]          PATIENT TYPE:  INP   LOCATION:  6715                         FACILITY:  [facility]   PHYSICIAN:  Cherylynn Ridges, M.D.    DATE OF BIRTH:  Feb 03, 1966   DATE OF CONSULTATION:  05/01/2006  DATE OF DISCHARGE:                                 CONSULTATION   Primary care physician is Dr. Jonny Ruiz.  Admitting physician is [employer].  Surgeon is Dr. Lindie Spruce.   REASON FOR CONSULTATION:  Right upper quadrant pain and cholelithiasis.   HISTORY OF PRESENT ILLNESS:  Ms. Kaufman is a 45 year old female patient  with a history of alcoholism and hepatitis C.  She relates many months  of constant right upper quadrant pain, never really goes away and worse  after eating.  She has not had any having nausea or vomiting with this  and no hematemesis.  She was admitted on April 27, 2006, responsive  secondary to acute alcohol intoxication.  According to the H&P, she  began drinking on April 26, 2006.  Her husband found her and brought  her to the ER.  By the time she was arousable, she was complaining of  right upper quadrant abdominal pain and chest pain.  Subsequent cardiac  workup has been unrevealing.  She has a known documented history of  gallstones from her records from the primary care physician's office.  Since coming in, the patient's white count has been normal.  She has had  mild transaminitis with a normal total bilirubin, and her pancreatic  enzymes have been negative.  She has undergone ultrasound of the abdomen  that did demonstrate gallstones, the largest being 1.2 cm in size.  She  has not experienced any ductal dilatation on this ultrasound, nor any  gallbladder wall thickening.  Surgical opinion has been requested.   REVIEW OF SYSTEMS:  As above.   SOCIAL HISTORY:  The patient did admit to me that she has partaken of  alcoholic beverages in the past.  When discussing daily alcohol  use, she  says, Oh, I have quit that.  When I asked her when her last drink was,  she said, Sunday I had one beer.  she did admit to cocaine use prior  to admission but said that she really had not used that in a long time  until Saturday.  The patient does not work.  She is not disabled.  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  She states that she rarely uses tobacco products.   FAMILY HISTORY:  Noncontributory.   PAST MEDICAL HISTORY:  1. Hepatitis C.  2. Alcoholism.  3. GERD.  4. Cholelithiasis.  5. Depression.  6. History of alcoholic pancreatitis.   PAST SURGICAL HISTORY:  1. Appendectomy.  2. C-section.   PHYSICAL EXAMINATION:  GENERAL:  Female patient complaining of constant  right upper quadrant abdominal pain, worst postprandial.  VITAL SIGNS:  Temperature 98.3, BP 92/63, pulse is 90 and regular,  respirations 18.  NEUROLOGIC:  The patient is alert and oriented x3, moving all  extremities x4.  No focal deficits.  HEENT:  Head is normocephalic.  Sclerae are not injected.  NECK:  Supple.  No adenopathy.  CHEST:  Bilateral lung sounds are clear to auscultation.  Respiratory  effort is nonlabored.  CARDIAC:  S1, S2,.  no rubs, murmurs, thrills or gallops.  ABDOMEN:  Soft, nondistended.  The patient has an umbilical piercing,  which does not show any evidence of infection or drainage.  She has  bowel sounds present.  She is mildly tender in the right upper quadrant  without a definite appreciable Murphy sign.  Uncertain if I am palpating  the liver border and possibly her spleen one fingerbreadth below each  rig cage.  EXTREMITIES:  Symmetrical in appearance without edema, cyanosis or  clubbing.   LABORATORY DATA:  PTT 30.  PT 13, INR 1.0.  AST 119, now 49, ALT 105,  now 60, total bilirubin 0.3, now 0.7.  Amylase 22, lipase 23.  White  count 4000, hemoglobin 12.8, platelets 141,000.  Sodium 140, potassium  4.8, CO2 29, glucose  86, BUN 6, creatinine 1.0.  urine drug screen was  obtained in the ER and was positive for cocaine as well as  benzodiazepines.  In review of the patient's listing of home  medications, there are no benzodiazepine products listed.   DIAGNOSTICS:  Ultrasound as per the history of present illness.  A HIDA  scan has been ordered for today and is pending.   IMPRESSION:  1. Right upper quadrant pain of uncertain etiology.  2. Cholelithiasis without evidence of acute cholecystitis so far.  3. Hepatitis C.  4. Alcoholism with associated thrombocytopenia/polysubstance abuse.  5. Gastroesophageal reflux disease.   PLAN:  1. Will need to follow up on the HIDA scan to rule out any obstructive      biliary issues not visualized on ultrasound.  2. Labs and other diagnostic data support elevated LFTs being      secondary to cirrhosis and not related to cholecystitis.  The      patient does not have fever, she has not had an elevated white      count, total bilirubin is normal and nonobstructive, and her ductal      system per ultrasound is normal and nondilated.  Because her      underlying cirrhosis, she has an increased risk for bleeding and      therefore would be considered a moderate to increased-risk surgical      patient.  Defer risks and benefits of potential surgery to Dr.      Lindie Spruce pending additional workup that may warrant surgical      intervention.  3. Feel that the patient's right upper quadrant pain and associated GI      symptoms are probably related to underlying cirrhosis and the fact      that she continues to partake of massive quantities of alcohol      beverages, causing intermittent alcoholic hepatitis.  She also has      underlying hepatitis C.  She may also be having some      problems since she has a known history of GERD and continued      alcohol consumption of esophagitis or gastritis secondary to      alcohol consumption. 4. We will follow along with  you.      Allison L. Rennis Harding, N.P.      Cherylynn Ridges, M.D.  Electronically Signed    ALE/MEDQ  D:  05/01/2006  T:  05/01/2006  Job:  [id number]   cc:   Corwin Levins, MD

## 2010-09-07 NOTE — Discharge Summary (Signed)
[name], [name]                 ACCOUNT NO.:  [id number]   MEDICAL RECORD NO.:  [id number]          PATIENT TYPE:  INP   LOCATION:  6715                         FACILITY:  [facility]   PHYSICIAN:  Barbette Hair. Artist Pais, DO      DATE OF BIRTH:  01-20-1966   DATE OF ADMISSION:  04/27/2006  DATE OF DISCHARGE:  05/03/2006                               DISCHARGE SUMMARY   DISCHARGE DIAGNOSES:  1. Abdominal pain.  2. Uncomplicated cholelithiasis.  3. Hepatitis C.  4. Alcohol abuse.  5. Drug abuse (positive cocaine).  6. Elevated troponin, resolved.  7. Depression.  8. History of alcohol induced pancreatitis.   DISCHARGE MEDICATIONS:  1. Ativan 1 mg t.i.d. today, 1 mg b.i.d. tomorrow, then 0.5 mg b.i.d.      the next day followed by 0.5 mg daily for one day.  Seven tablets      were given to the patient.  2. Vicodin one p.o. q.8 hours p.r.n.  3. Protonix 40 mg p.o. daily.  4. Lexapro 10 mg p.o. daily.   CONDITION ON DISCHARGE:  Improved.   FOLLOW UP PLAN:  With Dr. Jonny Ruiz this week.   CONSULTS:  Surgical consult obtained.  Final diagnosis revealed that she  had uncomplicated cholelithiasis, no evidence of cholecystitis.   HOSPITAL LABORATORIES:  On admission, urine drug screen significant for  benzodiazepines and cocaine.  A CBC on admission was essentially normal.  Lipase on admission was normal.  Alcohol level was 261 mg/mL (normal 0-  10).  Liver function tests on May 01, 2006, were normal except for  an AST of 49, ALT of 16 and albumin of 3.3.  On admission, AST 119, ALT  160.  Amylase on April 29, 2006, was normal at 22 (less than normal, as  normal range 27-131).   CARDIAC MARKERS:  Initial troponin was elevated at 4.61 but  approximately 1-1/2 hours later it was normal at 0.1, myoglobin CK MBs  were normal, CKs remained normal, troponin I remained normal.   HOSPITAL COURSE:  Patient admitted to the hospital service on April 27, 2006, with abdominal pain and  unresponsiveness.  See admission note for  details.  1. Acute alcohol intoxication, alcohol abuse, drug abuse.  Patient      presented with abdominal pain likely secondary to acute alcohol      poisoning.  Her pain improved during hospitalization.  She is left      with some mild discomfort at the time of discharge.  Due to the      discomfort, the patient was seen by general surgery.  A HIDA scan      was ordered, performed on May 02, 2006, and was normal.  Given      that, it was felt thought that the patient's cholelithiasis were      asymptomatic and there was no evidence of cholecystitis.  An      ultrasound performed on April 30, 2006, demonstrated gallstones      but was negative for biliary dilatation.  2. Chronic hepatitis C.  The patient has chronically  elevated liver      function test.  She is advised never to drink alcohol or use      illicit drugs again.  She verbalizes understanding of this.  It is      written on her discharge sheet.  She will call Alcoholic Anonymous      today and attend daily meetings.  3. Cocaine abuse.  Patient understands that any illicit or illegal      drugs used will likely kill her.  She is advised never to drink      alcohol or use illicit drugs again.  She voices understanding, but      again will Alcoholics Anonymous today.  4. She has gastroesophageal reflux disease, she will continue Protonix      as an outpatient.  5. Depression.  Continue Lexapro and tapering dose of Ativan.      Bruce Rexene Edison Swords, MD  Electronically Signed      Barbette Hair. Artist Pais, DO  Electronically Signed    BHS/MEDQ  D:  05/03/2006  T:  05/03/2006  Job:  [id number]   cc:   Corwin Levins, MD

## 2010-09-07 NOTE — H&P (Signed)
[name], [name]                 ACCOUNT NO.:  [id number]   MEDICAL RECORD NO.:  [id number]          PATIENT TYPE:  EMS   LOCATION:  ED                           [facility]ACILITY:  [facility]   PHYSICIAN:  Corinna L. Lendell Caprice, MDDATE OF BIRTH:  29-Jun-1965   DATE OF ADMISSION:  04/23/2004  DATE OF DISCHARGE:                                HISTORY & PHYSICAL   CHIEF COMPLAINT:  Vomiting and abdominal pain.   HISTORY OF PRESENT ILLNESS:  [name] is a 45 year old white female with a  history of heavy alcohol use who presents to the emergency room with a three-  week history of worsening epigastric pain, nausea and vomiting.  She has  occasion specks of blood in her emesis but no coffee ground emesis or  hematemesis.  She also has been feeling hot and cold.  She feels bad and  she has had some swelling.  She continues to drink daily.  She has no  previous history of pancreatitis.  She has never been tested for hepatitis.  She has had no liver  problems that she knows of in the past.   PAST MEDICAL HISTORY:  None.   MEDICATIONS:  None.   SOCIAL HISTORY:  The patient drinks a pint of liquor a day.  She is married.  She recently got a DUI and is court-ordered to attend alcohol  classes/support groups.  She denies drug use.   FAMILY HISTORY:  Her mother had ovarian cancer.  Her aunt died of stomach  cancer.  Her father has diabetes.   REVIEW OF SYSTEMS:  As above.  Otherwise negative.   PHYSICAL EXAMINATION:  VITAL SIGNS: Temperature is 99.5, pulse 117,  respiratory rate 20, oxygen saturation 98%.  GENERAL:  The patient is initially asleep but easily arousable.  HEENT: Normocephalic, atraumatic.  Pupils are equal, round and reactive to  light. Sclerae nonicteric.  She has the odor of alcohol on her breath.  Moist mucous membranes.  NECK:  Supple.  No lymphadenopathy.  No thyromegaly.  LUNGS:  Clear to auscultation bilaterally without wheezes, rhonchi or rales.  CARDIOVASCULAR:  Regular rate  and rhythm without murmurs, gallops or rubs.  ABDOMEN:  Normal bowel sounds.  She has epigastric tenderness with guarding.  Her abdomen is soft.  GU/RECTAL:  Deferred.  EXTREMITIES:  No clubbing, cyanosis or edema.  NEUROLOGIC: The patient is alert and oriented.  Cranial nerves and sensory  motor exam are intact.  She has no tremor or asterixis.  PSYCHIATRIC:  Normal affect.  SKIN:  No rash.   LABORATORY DATA:  CBC is unremarkable except for an MCV of 106.  Her  complete metabolic panel is significant for an SGOT of 351, SGPT 324.  Otherwise her complete metabolic panel is essentially normal.  Lipase is  122.  Urinalysis shows small blood, negative nitrites, negative leukocyte  esterase.   ASSESSMENT/PLAN:  1.  Pancreatitis secondary to alcohol.  The patient will be admitted to the      floor. She will get pain medications, anti-emetics, proton pump      inhibitor, and ice  chips for now.  2.  Hepatitis.  Probably alcoholic.  I will get an acute hepatitis panel to      rule out viral hepatitis.  3.  Alcohol dependence.  The patient will get thiamine and Ativan protocol.     Cori   CLS/MEDQ  D:  04/24/2004  T:  04/24/2004  Job:  [id number]   cc:   Windle Guard, M.D.  [address]  [place], [place] [address]  Fax: [phone]

## 2010-10-30 ENCOUNTER — Emergency Department (HOSPITAL_COMMUNITY): Payer: BC Managed Care – PPO

## 2010-10-30 ENCOUNTER — Inpatient Hospital Stay (HOSPITAL_COMMUNITY)
Admission: EM | Admit: 2010-10-30 | Discharge: 2010-11-04 | DRG: 750 | Disposition: A | Discharge status: Home / Self Care | Payer: BC Managed Care – PPO | Attending: Family Medicine | Admitting: Family Medicine

## 2010-10-30 DIAGNOSIS — K7030 Alcoholic cirrhosis of liver without ascites: Secondary | ICD-10-CM | POA: Diagnosis present

## 2010-10-30 DIAGNOSIS — R42 Dizziness and giddiness: Secondary | ICD-10-CM | POA: Diagnosis present

## 2010-10-30 DIAGNOSIS — F10931 Alcohol use, unspecified with withdrawal delirium: Principal | ICD-10-CM | POA: Diagnosis present

## 2010-10-30 DIAGNOSIS — E878 Other disorders of electrolyte and fluid balance, not elsewhere classified: Secondary | ICD-10-CM | POA: Diagnosis present

## 2010-10-30 DIAGNOSIS — E872 Acidosis, unspecified: Secondary | ICD-10-CM | POA: Diagnosis present

## 2010-10-30 DIAGNOSIS — D61818 Other pancytopenia: Secondary | ICD-10-CM | POA: Diagnosis present

## 2010-10-30 DIAGNOSIS — L02419 Cutaneous abscess of limb, unspecified: Secondary | ICD-10-CM | POA: Diagnosis present

## 2010-10-30 DIAGNOSIS — F1020 Alcohol dependence, uncomplicated: Secondary | ICD-10-CM | POA: Diagnosis present

## 2010-10-30 DIAGNOSIS — N390 Urinary tract infection, site not specified: Secondary | ICD-10-CM | POA: Diagnosis present

## 2010-10-30 DIAGNOSIS — K219 Gastro-esophageal reflux disease without esophagitis: Secondary | ICD-10-CM | POA: Diagnosis present

## 2010-10-30 DIAGNOSIS — G8929 Other chronic pain: Secondary | ICD-10-CM | POA: Diagnosis present

## 2010-10-30 DIAGNOSIS — M2550 Pain in unspecified joint: Secondary | ICD-10-CM | POA: Diagnosis present

## 2010-10-30 DIAGNOSIS — B1920 Unspecified viral hepatitis C without hepatic coma: Secondary | ICD-10-CM | POA: Diagnosis present

## 2010-10-30 DIAGNOSIS — F10231 Alcohol dependence with withdrawal delirium: Principal | ICD-10-CM | POA: Diagnosis present

## 2010-10-30 DIAGNOSIS — R7309 Other abnormal glucose: Secondary | ICD-10-CM | POA: Diagnosis present

## 2010-10-30 DIAGNOSIS — F17200 Nicotine dependence, unspecified, uncomplicated: Secondary | ICD-10-CM | POA: Diagnosis present

## 2010-10-30 DIAGNOSIS — E876 Hypokalemia: Secondary | ICD-10-CM | POA: Diagnosis present

## 2010-10-30 LAB — DIFFERENTIAL
Eosinophils Absolute: 0 10*3/uL (ref 0.0–0.7)
Eosinophils Relative: 0 % (ref 0–5)
Lymphs Abs: 0.9 10*3/uL (ref 0.7–4.0)
Monocytes Absolute: 0.5 10*3/uL (ref 0.1–1.0)

## 2010-10-30 LAB — URINE MICROSCOPIC-ADD ON

## 2010-10-30 LAB — CBC
Hemoglobin: 13.1 g/dL (ref 12.0–15.0)
MCH: 30.4 pg (ref 26.0–34.0)
MCHC: 32 g/dL (ref 30.0–36.0)
Platelets: 74 10*3/uL — ABNORMAL LOW (ref 150–400)
RDW: 15.5 % (ref 11.5–15.5)

## 2010-10-30 LAB — COMPREHENSIVE METABOLIC PANEL
ALT: 135 U/L — ABNORMAL HIGH (ref 0–35)
Albumin: 4.2 g/dL (ref 3.5–5.2)
Calcium: 9.3 mg/dL (ref 8.4–10.5)
GFR calc Af Amer: >60 mL/min (ref >60)
Glucose, Bld: 168 mg/dL — ABNORMAL HIGH (ref 70–99)
Potassium: 3.4 mEq/L — ABNORMAL LOW (ref 3.5–5.1)
Sodium: 138 mEq/L (ref 135–145)
Total Protein: 9.2 g/dL — ABNORMAL HIGH (ref 6.0–8.3)

## 2010-10-30 LAB — RAPID URINE DRUG SCREEN, HOSP PERFORMED
Benzodiazepines: NOT DETECTED
Cocaine: NOT DETECTED
Opiates: NOT DETECTED
Tetrahydrocannabinol: NOT DETECTED

## 2010-10-30 LAB — ETHANOL: Alcohol, Ethyl (B): <11 mg/dL (ref 0–11)

## 2010-10-30 LAB — URINALYSIS, ROUTINE W REFLEX MICROSCOPIC
Ketones, ur: 40 mg/dL — AB
Specific Gravity, Urine: 1.022 (ref 1.005–1.030)
pH: 8 (ref 5.0–8.0)

## 2010-10-30 LAB — PREGNANCY, URINE: Preg Test, Ur: NEGATIVE

## 2010-10-30 LAB — LIPASE, BLOOD: Lipase: 29 U/L (ref 11–59)

## 2010-10-31 DIAGNOSIS — L02419 Cutaneous abscess of limb, unspecified: Secondary | ICD-10-CM

## 2010-10-31 DIAGNOSIS — L03119 Cellulitis of unspecified part of limb: Secondary | ICD-10-CM

## 2010-10-31 LAB — LACTIC ACID, PLASMA: Lactic Acid, Venous: 1 mmol/L (ref 0.5–2.2)

## 2010-10-31 LAB — CBC
HCT: 33.2 % — ABNORMAL LOW (ref 36.0–46.0)
Hemoglobin: 10.9 g/dL — ABNORMAL LOW (ref 12.0–15.0)
MCH: 30.8 pg (ref 26.0–34.0)
MCHC: 32.8 g/dL (ref 30.0–36.0)
MCV: 93.8 fL (ref 78.0–100.0)

## 2010-10-31 LAB — COMPREHENSIVE METABOLIC PANEL
Alkaline Phosphatase: 62 U/L (ref 39–117)
BUN: 8 mg/dL (ref 6–23)
Calcium: 8.1 mg/dL — ABNORMAL LOW (ref 8.4–10.5)
Creatinine, Ser: 0.67 mg/dL (ref 0.50–1.10)
GFR calc Af Amer: >60 mL/min (ref >60)
Glucose, Bld: 95 mg/dL (ref 70–99)
Total Protein: 7.1 g/dL (ref 6.0–8.3)

## 2010-11-01 LAB — URINE CULTURE: Culture  Setup Time: 201207110529

## 2010-11-01 LAB — BASIC METABOLIC PANEL
CO2: 25 mEq/L (ref 19–32)
Calcium: 8.5 mg/dL (ref 8.4–10.5)
Chloride: 100 mEq/L (ref 96–112)
Potassium: 3.5 mEq/L (ref 3.5–5.1)
Sodium: 133 mEq/L — ABNORMAL LOW (ref 135–145)

## 2010-11-01 LAB — MAGNESIUM: Magnesium: 1.2 mg/dL — ABNORMAL LOW (ref 1.5–2.5)

## 2010-11-01 LAB — COMPREHENSIVE METABOLIC PANEL
BUN: 5 mg/dL — ABNORMAL LOW (ref 6–23)
CO2: 27 mEq/L (ref 19–32)
Chloride: 103 mEq/L (ref 96–112)
Creatinine, Ser: 0.52 mg/dL (ref 0.50–1.10)
GFR calc Af Amer: >60 mL/min (ref >60)
GFR calc non Af Amer: >60 mL/min (ref >60)
Total Bilirubin: 1 mg/dL (ref 0.3–1.2)

## 2010-11-01 LAB — CBC
HCT: 33 % — ABNORMAL LOW (ref 36.0–46.0)
MCV: 96.8 fL (ref 78.0–100.0)
RBC: 3.41 MIL/uL — ABNORMAL LOW (ref 3.87–5.11)
RDW: 14.8 % (ref 11.5–15.5)
WBC: 1.3 10*3/uL — CL (ref 4.0–10.5)

## 2010-11-01 LAB — PHOSPHORUS: Phosphorus: 2.8 mg/dL (ref 2.3–4.6)

## 2010-11-02 LAB — CBC
Platelets: 40 10*3/uL — ABNORMAL LOW (ref 150–400)
RBC: 3.45 MIL/uL — ABNORMAL LOW (ref 3.87–5.11)
WBC: 2.3 10*3/uL — ABNORMAL LOW (ref 4.0–10.5)

## 2010-11-03 LAB — COMPREHENSIVE METABOLIC PANEL
ALT: 113 U/L — ABNORMAL HIGH (ref 0–35)
Albumin: 2.7 g/dL — ABNORMAL LOW (ref 3.5–5.2)
Alkaline Phosphatase: 64 U/L (ref 39–117)
BUN: 4 mg/dL — ABNORMAL LOW (ref 6–23)
Chloride: 101 mEq/L (ref 96–112)
GFR calc Af Amer: >60 mL/min (ref >60)
Glucose, Bld: 176 mg/dL — ABNORMAL HIGH (ref 70–99)
Potassium: 3.1 mEq/L — ABNORMAL LOW (ref 3.5–5.1)
Sodium: 135 mEq/L (ref 135–145)
Total Bilirubin: 0.9 mg/dL (ref 0.3–1.2)

## 2010-11-03 LAB — CBC
HCT: 33 % — ABNORMAL LOW (ref 36.0–46.0)
Hemoglobin: 10.5 g/dL — ABNORMAL LOW (ref 12.0–15.0)
WBC: 2.3 10*3/uL — ABNORMAL LOW (ref 4.0–10.5)

## 2010-11-03 LAB — MAGNESIUM: Magnesium: 1.7 mg/dL (ref 1.5–2.5)

## 2010-11-07 ENCOUNTER — Encounter: Payer: Self-pay | Admitting: Internal Medicine

## 2010-11-07 DIAGNOSIS — Z0000 Encounter for general adult medical examination without abnormal findings: Secondary | ICD-10-CM | POA: Insufficient documentation

## 2010-11-07 DIAGNOSIS — R7302 Impaired glucose tolerance (oral): Secondary | ICD-10-CM | POA: Insufficient documentation

## 2010-11-08 ENCOUNTER — Encounter: Payer: Self-pay | Admitting: Internal Medicine

## 2010-11-08 NOTE — H&P (Signed)
[name], [name]               ACCOUNT NO.:  [id number]  MEDICAL RECORD NO.:  [id number]  LOCATION:  WLED                         FACILITY:  [facility]  PHYSICIAN:  Ladell Pier, M.D.   DATE OF BIRTH:  [birth year]  DATE OF ADMISSION:  10/30/2010 DATE OF DISCHARGE:                             HISTORY & PHYSICAL   CHIEF COMPLAINT:  Shaking allover.  HISTORY OF PRESENT ILLNESS:  The patient is a 45 year old white female, past medical history significant for alcohol abuse, hepatitis C, and chronic abdominal pain.  The patient stated that her last drink was yesterday, she has not had a drink today.  She stated that this morning, she started feeling shaking allover.  Her arms were shaking and she started feeling lightheaded and her fingers were numb, her toes gotten numb.  She stated that she had no chest pain.  She complains of abdominal pain and some nausea, vomiting.  She stated her vision has been blurred.  She states that she feels better now, but she still shaking all over.  PAST MEDICAL HISTORY: 1. Chronic abdominal pain. 2. Alcohol dependence. 3. 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 4. 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 5. Tobacco abuse. 6. History of DTs in the past. 7. GERD. 8. History of gastritis. 9. History of polyarthralgia. 10.Bilateral carpal tunnel syndrome.  FAMILY HISTORY:  Per the patient, mother has COPD.  Father has diabetes.  SOCIAL HISTORY:  The patient is married.  She has 3 children.  She is unemployed.  She smokes she states 1 cigarette per day, although previous admission note states that she smokes heavily.  Alcohol, she states she drinks about 2 drinks per day.  MEDICINES:  She states that she takes Ambien 10 mg p.r.n.  REVIEW OF SYSTEMS:  Negative, otherwise stated in HPI.  PHYSICAL EXAMINATION:  VITAL SIGNS: Temperature 99.1, her pulse of 111, respirations 18, blood pressure 119/77, pulse ox 100% on room  air. GENERAL: The patient is laying on the stretcher, well-nourished white female.  The patient shaking all over, complaining of abdominal pain. CARDIOVASCULAR:  She is in regular rate and rhythm. LUNGS:  Clear bilaterally. ABDOMEN:  Positive bowel sounds, soft with generalized tenderness. EXTREMITIES: Without edema.  She does have, seem like bruising versus cellulitis of the right lower extremity going up the back of her right leg and on the posterior thigh area.  Pulses are 2+ DP bilaterally. NEUROLOGIC: Cranial nerves II-XII intact.  LABORATORY DATA:  Lipase 29, urine pregnancy negative.  Urine 3 to 6 WBC, 7 to 10 RBCs.  Urine positive for small leukocytes.  UDS negative. Ammonia 30.  WBC 3.5, hemoglobin 13.1, platelets of 74, MCV 95.1. Alcohol less than 11.  Sodium 138, potassium 3.4, chloride 87, CO2 17, BUN 10, creatinine 0.91, bilirubin 2.0, alk phos 89, AST 249, ALT 139, calcium 9.3.  Chest x-ray, no acute disease.  ASSESSMENT AND PLAN: 1. Delirium tremens. 2. Alcohol and tobacco abuse. 3. Dizziness. 4. Abdominal pain that is chronic. 5. Gastroesophageal reflux disease. 6. Hypokalemia. 7. Metabolic acidosis and anion gap metabolic acidosis. 8. Dizziness.  We will admit the patient to the hospital.  For her DTs, we  will put her on Ativan withdrawal protocol and give her thiamine and folic acid. Tomorrow, we will get a psych consult for her alcohol abuse for possible detox.  Dizziness, we will get a head CT, give her some IV fluids. Check orthostatics.  Her ultrasound is stable, so I suspect her transaminitis is secondary to her alcohol abuse.  For her gastroesophageal reflux disease, we will put her on a PPI.  For her potassium, we will replete her potassium.  She does have anion gap metabolic acidosis.  I am unsure if the patient had consumed, she does not say that she consumed anything else apart from alcohol.  We will check a lactic acid level.  With the bruising on  her posterior thigh, we will also check a creatinine-kinase level. We will also get a Doppler ultrasound of the left lower extremity and put her on Levaquin empirically for possible cellulitis since it does look a little bit red.  Time spent with the patient and doing this admission is approximately 1 hour.     Ladell Pier, M.D.     NJ/MEDQ  D:  10/30/2010  T:  10/30/2010  Job:  [id number]  Electronically Signed by Ladell Pier M.D. on 11/08/2010 11:06:42 PM

## 2010-11-09 ENCOUNTER — Ambulatory Visit (INDEPENDENT_AMBULATORY_CARE_PROVIDER_SITE_OTHER): Payer: BC Managed Care – PPO | Admitting: Internal Medicine

## 2010-11-09 ENCOUNTER — Encounter: Payer: Self-pay | Admitting: Internal Medicine

## 2010-11-09 ENCOUNTER — Other Ambulatory Visit (INDEPENDENT_AMBULATORY_CARE_PROVIDER_SITE_OTHER): Payer: BC Managed Care – PPO

## 2010-11-09 DIAGNOSIS — R109 Unspecified abdominal pain: Secondary | ICD-10-CM | POA: Insufficient documentation

## 2010-11-09 DIAGNOSIS — E878 Other disorders of electrolyte and fluid balance, not elsewhere classified: Secondary | ICD-10-CM

## 2010-11-09 DIAGNOSIS — D61818 Other pancytopenia: Secondary | ICD-10-CM

## 2010-11-09 DIAGNOSIS — R350 Frequency of micturition: Secondary | ICD-10-CM

## 2010-11-09 DIAGNOSIS — R7302 Impaired glucose tolerance (oral): Secondary | ICD-10-CM

## 2010-11-09 DIAGNOSIS — K8020 Calculus of gallbladder without cholecystitis without obstruction: Secondary | ICD-10-CM | POA: Insufficient documentation

## 2010-11-09 DIAGNOSIS — R7309 Other abnormal glucose: Secondary | ICD-10-CM

## 2010-11-09 DIAGNOSIS — Z0000 Encounter for general adult medical examination without abnormal findings: Secondary | ICD-10-CM

## 2010-11-09 DIAGNOSIS — K7460 Unspecified cirrhosis of liver: Secondary | ICD-10-CM

## 2010-11-09 HISTORY — DX: Unspecified cirrhosis of liver: K74.60

## 2010-11-09 LAB — BASIC METABOLIC PANEL
CO2: 27 mEq/L (ref 19–32)
Calcium: 9 mg/dL (ref 8.4–10.5)
Chloride: 105 mEq/L (ref 96–112)
Glucose, Bld: 131 mg/dL — ABNORMAL HIGH (ref 70–99)
Potassium: 3.7 mEq/L (ref 3.5–5.1)
Sodium: 136 mEq/L (ref 135–145)

## 2010-11-09 LAB — CBC WITH DIFFERENTIAL/PLATELET
Basophils Absolute: 0 10*3/uL (ref 0.0–0.1)
Eosinophils Relative: 2.1 % (ref 0.0–5.0)
HCT: 36.3 % (ref 36.0–46.0)
Lymphocytes Relative: 27.9 % (ref 12.0–46.0)
Lymphs Abs: 1 10*3/uL (ref 0.7–4.0)
Monocytes Relative: 16.9 % — ABNORMAL HIGH (ref 3.0–12.0)
Neutrophils Relative %: 52.3 % (ref 43.0–77.0)
Platelets: 115 10*3/uL — ABNORMAL LOW (ref 150.0–400.0)
RDW: 17.3 % — ABNORMAL HIGH (ref 11.5–14.6)
WBC: 3.5 10*3/uL — ABNORMAL LOW (ref 4.5–10.5)

## 2010-11-09 LAB — HEPATIC FUNCTION PANEL
ALT: 294 U/L — ABNORMAL HIGH (ref 0–35)
AST: 334 U/L — ABNORMAL HIGH (ref 0–37)
Albumin: 3.8 g/dL (ref 3.5–5.2)
Alkaline Phosphatase: 66 U/L (ref 39–117)
Bilirubin, Direct: 0.4 mg/dL — ABNORMAL HIGH (ref 0.0–0.3)
Total Protein: 8.2 g/dL (ref 6.0–8.3)

## 2010-11-09 LAB — URINALYSIS, ROUTINE W REFLEX MICROSCOPIC
Ketones, ur: NEGATIVE
Leukocytes, UA: NEGATIVE
Specific Gravity, Urine: <=1.005 (ref 1.000–1.030)
Urobilinogen, UA: 0.2 (ref 0.0–1.0)
pH: 6.5 (ref 5.0–8.0)

## 2010-11-09 NOTE — Discharge Summary (Signed)
NAMENEIRA, BENTSEN               ACCOUNT NO.:  [id number]  MEDICAL RECORD NO.:  [id number]  LOCATION:  1520                         FACILITY:  [facility]  PHYSICIAN:  Pleas Koch, MD        DATE OF BIRTH:  [birth year]  DATE OF ADMISSION:  10/30/2010 DATE OF DISCHARGE:  11/04/2010                              DISCHARGE SUMMARY   DISCHARGE DIAGNOSES: 1. Delirium tremens secondary to ethanol abuse. 2. Possible diabetes mellitus with A1c 6.4. 3. Cirrhosis, likely multifactorial secondary to hepatitis C plus     ethanol use. 4. Dyselectrolytemia, now resolved. 5. Possible urinary tract infection with multiple colony-forming units     but symptomatic with dysuria and discharged on oral antibiotics.  DISCHARGE MEDICATIONS:  As follows: 1. Ambien 5 mg 1 tablet q.h.s. p.r.n. 2. Hydrocodone/APAP 5/500 one tab q.6 p.r.n. 3. Famotidine 40 mg b.i.d., 60 tablets prescribed. 4. I have held off on prescribing metformin given the fact that she     may take alcohol and become sick from the same and I will prefer     that she sees her primary care physician, Dr. Jonny Ruiz, for the same. 5. Levofloxacin 750 daily for 3 days.  IMAGING STUDIES: 1. Pertinent imaging studies done October 30, 2010, showed no acute     disease. 2. Ultrasound abdomen, October 30, 2010, showed coarse hepatic     echotexture and little infiltrates.  Correlate with impression of     hepatic cirrhosis on recent CT, March  2011.  No focal hepatic     abnormality.  Small gallstones as described with associated     cholecystitis. 3. She underwent a CT head, October 31, 2010, showing mild brain atrophy,     no acute focal findings.  HISTORY:  The patient is a 45 year old female with medical history significant for ethanol abuse, hepatitis C, chronic abdominal pain, stating last drink was yesterday.  Day before admission, she starting shaking, her fingers were numb.  She had no chest pain.  She had some nausea, some vomiting.  No blurred  vision.  The patient is feeling better now and has been shaking all over.  She smokes heavily and states she drinks about 2 to 3 drinks per day.  Temperature initially was 99.1, pulse 111, respirations were 18, blood pressure was 119/70, pulse ox 100% on room air.  She was complaining of abdominal pain on admission. She had some bruising versus cellulitis of right lower extremity going back of her leg.  Initial lipase was 29.  Urine was positive for small leukocytes,  UDS negative.  Ammonia 30.  Bilirubin 2.0, alk phos 89, AST 249, ALT 139.  HOSPITAL COURSE ACCORDING TO ISSUES: 1. DTs.  The patient kept on CIWA protocol and received initially     pretty high doses of Ativan which prompted her to be kept on CIWA     protocol.  She did well and became less tremulous.  The patient did     not wish to have any further substance abuse counseling, although     this was recommended to her. 2. Questionable diabetes mellitus.  I got HbA1c which was 6.4.  I have  a diabetic educator speak to her.  However, I do believe that she     would likely benefit from outpatient management with Dr. Jonny Ruiz.  I     started her initially on metformin but given the risks of this     associated with alcohol, I prefer the discontinue the same at this     point unless of course a repeat A1c in 2 to 3 months is higher than     7. 3. Cirrhosis, multifactorial.  Elevated LFTs did trend downward and     she will have to be followed up as an outpatient for interval     ultrasound of the abdomen. 4. Dyselectrolytemia.  She was placed initially on magnesium and then     on potassium as she had low levels of same and I would recommend     CMET with mag and phos in the outpatient setting. 5. Questionable UTI.  She was kept initially on levofloxacin and I     will have her continue on the same.  The patient was seen on day of discharge.  She was doing well. Temperature 98.1, pulse 55, respirations 18, blood pressure  systolic 150 to 128, diastolic 70 to 80; saturating 16% on room air.  She was less tremulous.  She had no abdominal pain.  Chest clinically clear. Abdomen, soft, nontender, nondistended.  There was no lower extremity edema.  The patient was discharged home in stable state and over 40 minutes seen coordinating this patient's discharge.          ______________________________ Pleas Koch, MD     JS/MEDQ  D:  11/04/2010  T:  11/04/2010  Job:  [id number]  cc:   Dr. Jonny Ruiz  Electronically Signed by Pleas Koch MD on 11/09/2010 09:08:57 PM

## 2010-11-09 NOTE — Patient Instructions (Addendum)
The urine specimen will be sent for testing If infection, you will be notified about antibiotic Please go to LAB in the Basement for the blood and/or urine tests to be done today Please call the phone number [phone] (the PhoneTree System) for results of testing in 2-3 days;  When calling, simply dial the number, and when prompted enter the MRN number above (the Medical Record Number) and the # key, then the message should start. Please return in 6 mo with Lab testing done 3-5 days before

## 2010-11-10 ENCOUNTER — Encounter: Payer: Self-pay | Admitting: Internal Medicine

## 2010-11-10 NOTE — Assessment & Plan Note (Signed)
asympt - for f/u labs today,  to f/u any worsening symptoms or concerns

## 2010-11-10 NOTE — Assessment & Plan Note (Signed)
For f/u cbc today, felt due to ETOH,  to f/u any worsening symptoms or concerns, no overt bleeding

## 2010-11-10 NOTE — Assessment & Plan Note (Signed)
Also for f/u lft's today,  to f/u any worsening symptoms or concerns, to cont abstain from ETOH

## 2010-11-10 NOTE — Assessment & Plan Note (Signed)
Asympt, ongoing issue, a1c normal, no need for OHA at this time

## 2010-11-10 NOTE — Progress Notes (Signed)
Subjective:    Patient ID: [name], female    DOB: [birth year], 45 y.o.   MRN: [id number]  HPI  Here after recent hospn second time for DT's/ETOH dependence, with mult electrolyte abn's, and mild elev CBG's but normal a1c..  Has not had any ETOH since d/c, and states no complaints, here for f/u labs as asked at d/c.  Overall good compliance with treatment, and good medicine tolerability.  Denies worsening reflux, dysphagia, abd pain, n/v, bowel change or blood.  Ambien works well for sleep.   Pt denies fever, wt loss, night sweats, loss of appetite, or other constitutional symptoms  Pt denies chest pain, increased sob or doe, wheezing, orthopnea, PND, increased LE swelling, palpitations, dizziness or syncope. Pt denies new neurological symptoms such as new headache, or facial or extremity weakness or numbness   Pt denies polydipsia, polyuria.  Finished the 3 days antibx post d/c, but today has urinary frequency without other dysuria, urgency or blood. Past Medical History  Diagnosis Date  . Impaired glucose tolerance 11/07/2010  . Hepatitis C approx dx 2005    no tx to date  . Pancreatitis     HX of  . Alcohol dependency   . GERD (gastroesophageal reflux disease)   . Chronic constipation   . Gallstones   . Drug dependency     Hx of cocaine use  . Depression   . Rheumatoid factor positive   . Hemochromatosis     last phlebotomy tx 2-3 yrs  . Pancytopenia   . Abnormal vaginal bleeding     uterine fibroid  . Gallstone 11/09/2010  . Chronic abdominal pain 11/09/2010  . Cirrhosis 11/09/2010   Past Surgical History  Procedure Date  . Appendectomy   . Cesarean section x 2    reports that she has been smoking.  She does not have any smokeless tobacco history on file. She reports that she drinks alcohol. She reports that she does not use illicit drugs. family history includes Cancer in her mother and Diabetes in her father. Allergies  Allergen Reactions  . Iodinated Diagnostic Agents  Hives   Current Outpatient Prescriptions on File Prior to Visit  Medication Sig Dispense Refill  . glucose blood test strip 1 each by Other route as needed. Use as instructed       . Lancets MISC by Does not apply route.        Marland Kitchen omeprazole (PRILOSEC) 20 MG capsule Take 40 mg by mouth daily.         Review of Systems Review of Systems  Constitutional: Negative for diaphoresis and unexpected weight change.  HENT: Negative for drooling and tinnitus.   Eyes: Negative for photophobia and visual disturbance.  Respiratory: Negative for choking and stridor.   Gastrointestinal: Negative for vomiting and blood in stool.  Genitourinary: Negative for hematuria and decreased urine volume.  Musculoskeletal: Negative for gait problem.  Skin: Negative for color change and wound.  Neurological: Negative for tremors and numbness.  Psychiatric/Behavioral: Negative for decreased concentration. The patient is not hyperactive.       Objective:   Physical Exam BP 110/78  Pulse 61  Temp(Src) 98.6 F (37 C) (Oral)  Ht 5\' 2"  (1.575 m)  Wt 150 lb 8 oz (68.266 kg)  BMI 27.53 kg/m2  SpO2 99%  LMP 11/04/2010 Physical Exam  VS noted Constitutional: Pt appears well-developed and well-nourished.  HENT: Head: Normocephalic.  Right Ear: External ear normal.  Left Ear: External ear normal.  Eyes:  Conjunctivae and EOM are normal. Pupils are equal, round, and reactive to light.  Neck: Normal range of motion. Neck supple.  Cardiovascular: Normal rate and regular rhythm.   Pulmonary/Chest: Effort normal and breath sounds normal.  Abd:  Soft, NT, non-distended, + BS Neurological: Pt is alert. No cranial nerve deficit.  Skin: Skin is warm. No erythema.  Psychiatric: Pt behavior is normal. Thought content normal.         Assessment & Plan:

## 2010-11-10 NOTE — Assessment & Plan Note (Signed)
?   Significance - for urine studies today,  to f/u any worsening symptoms or concerns, antibx pending results

## 2010-12-14 ENCOUNTER — Emergency Department (HOSPITAL_COMMUNITY): Payer: BC Managed Care – PPO

## 2010-12-14 ENCOUNTER — Inpatient Hospital Stay (HOSPITAL_COMMUNITY)
Admission: EM | Admit: 2010-12-14 | Discharge: 2010-12-20 | DRG: 750 | Disposition: A | Discharge status: Home / Self Care | Payer: BC Managed Care – PPO | Attending: Internal Medicine | Admitting: Internal Medicine

## 2010-12-14 DIAGNOSIS — D61818 Other pancytopenia: Secondary | ICD-10-CM | POA: Diagnosis present

## 2010-12-14 DIAGNOSIS — S0010XA Contusion of unspecified eyelid and periocular area, initial encounter: Secondary | ICD-10-CM | POA: Diagnosis present

## 2010-12-14 DIAGNOSIS — K7030 Alcoholic cirrhosis of liver without ascites: Secondary | ICD-10-CM | POA: Diagnosis present

## 2010-12-14 DIAGNOSIS — W010XXA Fall on same level from slipping, tripping and stumbling without subsequent striking against object, initial encounter: Secondary | ICD-10-CM | POA: Diagnosis present

## 2010-12-14 DIAGNOSIS — F1020 Alcohol dependence, uncomplicated: Secondary | ICD-10-CM | POA: Diagnosis present

## 2010-12-14 DIAGNOSIS — K5900 Constipation, unspecified: Secondary | ICD-10-CM | POA: Diagnosis present

## 2010-12-14 DIAGNOSIS — R109 Unspecified abdominal pain: Secondary | ICD-10-CM | POA: Diagnosis present

## 2010-12-14 DIAGNOSIS — F10939 Alcohol use, unspecified with withdrawal, unspecified: Principal | ICD-10-CM | POA: Diagnosis present

## 2010-12-14 DIAGNOSIS — K2920 Alcoholic gastritis without bleeding: Secondary | ICD-10-CM | POA: Diagnosis present

## 2010-12-14 DIAGNOSIS — F10239 Alcohol dependence with withdrawal, unspecified: Principal | ICD-10-CM | POA: Diagnosis present

## 2010-12-14 DIAGNOSIS — B1920 Unspecified viral hepatitis C without hepatic coma: Secondary | ICD-10-CM | POA: Diagnosis present

## 2010-12-14 LAB — CBC
HCT: 37.8 % (ref 36.0–46.0)
Hemoglobin: 11.9 g/dL — ABNORMAL LOW (ref 12.0–15.0)
MCHC: 31.5 g/dL (ref 30.0–36.0)
MCV: 92.4 fL (ref 78.0–100.0)
WBC: 2.3 10*3/uL — ABNORMAL LOW (ref 4.0–10.5)

## 2010-12-14 LAB — DIFFERENTIAL
Basophils Relative: 1 % (ref 0–1)
Eosinophils Absolute: 0.1 10*3/uL (ref 0.0–0.7)
Eosinophils Relative: 3 % (ref 0–5)
Monocytes Absolute: 0.2 10*3/uL (ref 0.1–1.0)
Neutro Abs: 0.9 10*3/uL — ABNORMAL LOW (ref 1.7–7.7)
Neutrophils Relative %: 41 % — ABNORMAL LOW (ref 43–77)

## 2010-12-14 LAB — COMPREHENSIVE METABOLIC PANEL
Albumin: 3.6 g/dL (ref 3.5–5.2)
Alkaline Phosphatase: 77 U/L (ref 39–117)
BUN: 4 mg/dL — ABNORMAL LOW (ref 6–23)
Chloride: 105 mEq/L (ref 96–112)
GFR calc Af Amer: >60 mL/min (ref >60)
Glucose, Bld: 109 mg/dL — ABNORMAL HIGH (ref 70–99)
Potassium: 3.5 mEq/L (ref 3.5–5.1)
Total Bilirubin: 0.5 mg/dL (ref 0.3–1.2)

## 2010-12-14 LAB — URINALYSIS, ROUTINE W REFLEX MICROSCOPIC
Ketones, ur: 15 mg/dL — AB
Nitrite: NEGATIVE
Urobilinogen, UA: 1 mg/dL (ref 0.0–1.0)
pH: 6.5 (ref 5.0–8.0)

## 2010-12-14 LAB — RAPID URINE DRUG SCREEN, HOSP PERFORMED
Barbiturates: NOT DETECTED
Benzodiazepines: NOT DETECTED

## 2010-12-14 LAB — ETHANOL: Alcohol, Ethyl (B): 378 mg/dL — ABNORMAL HIGH (ref 0–11)

## 2010-12-15 LAB — CBC
MCH: 29.2 pg (ref 26.0–34.0)
MCHC: 31.3 g/dL (ref 30.0–36.0)
MCV: 93.2 fL (ref 78.0–100.0)
Platelets: 50 10*3/uL — ABNORMAL LOW (ref 150–400)
RDW: 16.6 % — ABNORMAL HIGH (ref 11.5–15.5)
WBC: 1.3 10*3/uL — CL (ref 4.0–10.5)

## 2010-12-15 LAB — BASIC METABOLIC PANEL
BUN: 5 mg/dL — ABNORMAL LOW (ref 6–23)
CO2: 29 mEq/L (ref 19–32)
Calcium: 8.6 mg/dL (ref 8.4–10.5)
Creatinine, Ser: 0.49 mg/dL — ABNORMAL LOW (ref 0.50–1.10)
Glucose, Bld: 113 mg/dL — ABNORMAL HIGH (ref 70–99)

## 2010-12-15 LAB — LIPASE, BLOOD: Lipase: 53 U/L (ref 11–59)

## 2010-12-15 NOTE — H&P (Signed)
NAMEMarland [name]  SAIDEE, GEREMIA NO.:  [id number]  MEDICAL RECORD NO.:  [id number]  LOCATION:  1512                         FACILITY:  [facility]  PHYSICIAN:  Della Goo, M.D. DATE OF BIRTH:  April 16, 1966  DATE OF ADMISSION:  12/14/2010 DATE OF DISCHARGE:                             HISTORY & PHYSICAL   DATE OF ADMISSION:  December 15, 2010.  PRIMARY CARE PHYSICIAN:  Corwin Levins, MD  CHIEF COMPLAINT:  Wishes detox treatment for alcoholism.  HISTORY OF PRESENT ILLNESS:  This is a 45 year old female with a history of alcoholism, cirrhosis, and hepatitis C, who presents to the emergency department secondary to desires to undergo detox treatment.  She reports heavily drinking.  She states she drinks a drink called Loko which has about 12% alcohol per beverage, which is a 12 ounce or 16 ounce beverage, and her last drink was 4 hours prior to arrival.  She was hospitalized 1 month ago for detox as well, but started drinking again. The patient reports that she has been drinking heavily for 3-4 years. She reports having some tremors, shakiness, and pain.  She also fell and hit her face against a table one day ago.  She has a black eye from this and has increased pain as well.  A CT scan was performed of the maxillofacial areas and was found to be negative for any acute fractures.  The CT scan of her head was also negative for acute intracranial abnormalities or hemorrhage.  Her alcohol level was found to be 378.  Patient was referred for medical admission.  PAST MEDICAL HISTORY:  Significant for: 1. Alcoholism. 2. Cirrhosis. 3. Hepatitis C, which is chronic. 4. Uterine fibroids. 5. Gallstones. 6. Irritable bowel syndrome. 7. History of pancreatitis. 8. Status post appendectomy. 9. Status post C-section x2. 10.Status post D and C in the past.  MEDICATIONS:  At this time, none.  ALLERGIES:  To BETADINE.  SOCIAL HISTORY:  Patient is married.  She smokes rarely, one  cigarette a month, and her alcohol habit is one of the drinks called Loko daily, which she states is a very concentrated alcohol drink.  She denies any illicit drug usage.  FAMILY HISTORY:  Noncontributory.  REVIEW OF SYSTEMS:  Pertinent as mentioned above.  PHYSICAL EXAMINATION FINDINGS:  GENERAL:  This is a 45 year old, older than stated age appearing, Caucasian female who is in no acute distress. VITAL SIGNS:  Temperature 98.9, blood pressure 139/100, now 102/54, but at the lowest it had been 96/58.  Heart rate 90, respirations 22, O2 sats 95%. HEENT:  Normocephalic, atraumatic, except for the periorbital ecchymosis of the left eye.  Pupils are equally round, reactive to light. Extraocular movements intact.  Funduscopic, benign.  There is no scleral icterus.  There is no hemotympanum.  Nares are patent bilaterally. Oropharynx is clear.  There are no tongue lacerations. NECK:  Supple.  Full range of motion.  No thyromegaly, adenopathy, or jugulovenous distention. CARDIOVASCULAR:  Regular rate and rhythm.  No murmurs, gallops, or rubs appreciated. LUNGS:  Clear to auscultation bilaterally.  No rales, rhonchi, or wheezes. ABDOMEN:  Mildly tender to superficial palpation.  There is no rebound or guarding.  No hepatosplenomegaly detected  on examination. EXTREMITIES:  Without cyanosis, clubbing, or edema. NEUROLOGIC:  Nonfocal.  LABORATORY STUDIES:  White blood cell count 2.3, hemoglobin 11.9, hematocrit 37.8, MCV 92.4, platelets 79%.  Neutrophils 41%, lymphocytes 45%.  Sodium 142, potassium 3.5, chloride 105, carbon dioxide 28, BUN 4, creatinine 0.55, and glucose 109.  Alcohol level 378, lipase level 90. Urine drug screen negative.  Urinalysis reveals large urine hemoglobin, small leukocyte esterase.  Urine microscopic with urine white blood cells 3 to 6 and urine red blood cells too numerous to count.  CT scan of the head and CT scan of the maxillofacial area as mentioned  above.  ASSESSMENT:  45 year old female being admitted with: 1. Alcohol withdrawal. 2. Alcoholism. 3. Acute-on-chronic pancreatitis. 4. Leukopenia, most likely secondary to her cirrhosis and hepatitis. 5. Thrombocytopenia, secondary to cirrhosis. 6. Cirrhosis, secondary to hepatitis C and alcoholic liver disease. 7. Periorbital contusion of the left eye status post fall.  PLAN:  Patient will be admitted.  She will be monitored for further neurologic changes secondary to her eye injury.  Patient has been placed on the CIWA protocol for alcohol withdrawal and IV fluids have been ordered for fluid resuscitation.  Patient's lipase levels will be monitored.  She will be placed on clear liquids at this time and antiemetic therapy will also be ordered if needed, so will pain control therapy.  Substance abuse counseling has been requested for this patient for consideration of further rehabilitation/detox treatment and psychiatric counseling.  Patient is a full code.     Della Goo, M.D.     HJ/MEDQ  D:  12/15/2010  T:  12/15/2010  Job:  [id number]  cc:   Corwin Levins, MD [address] [place] [address]  Electronically Signed by Della Goo M.D. on 12/15/2010 10:36:00 PM

## 2010-12-16 LAB — DIFFERENTIAL
Basophils Absolute: 0 10*3/uL (ref 0.0–0.1)
Basophils Relative: 1 % (ref 0–1)
Eosinophils Absolute: 0 10*3/uL (ref 0.0–0.7)
Eosinophils Relative: 1 % (ref 0–5)
Metamyelocytes Relative: 0 %
Monocytes Absolute: 0.1 10*3/uL (ref 0.1–1.0)
Monocytes Relative: 6 % (ref 3–12)
Myelocytes: 0 %

## 2010-12-16 LAB — PROTIME-INR: Prothrombin Time: 15.7 seconds — ABNORMAL HIGH (ref 11.6–15.2)

## 2010-12-16 LAB — COMPREHENSIVE METABOLIC PANEL
Albumin: 2.9 g/dL — ABNORMAL LOW (ref 3.5–5.2)
Alkaline Phosphatase: 59 U/L (ref 39–117)
BUN: 3 mg/dL — ABNORMAL LOW (ref 6–23)
Calcium: 8.6 mg/dL (ref 8.4–10.5)
Glucose, Bld: 108 mg/dL — ABNORMAL HIGH (ref 70–99)
Potassium: 3.7 mEq/L (ref 3.5–5.1)
Sodium: 133 mEq/L — ABNORMAL LOW (ref 135–145)
Total Protein: 7 g/dL (ref 6.0–8.3)

## 2010-12-16 LAB — CBC
MCH: 29.2 pg (ref 26.0–34.0)
Platelets: 63 10*3/uL — ABNORMAL LOW (ref 150–400)
RBC: 3.32 MIL/uL — ABNORMAL LOW (ref 3.87–5.11)
RDW: 15.8 % — ABNORMAL HIGH (ref 11.5–15.5)
WBC: 1.7 10*3/uL — ABNORMAL LOW (ref 4.0–10.5)

## 2010-12-17 ENCOUNTER — Inpatient Hospital Stay (HOSPITAL_COMMUNITY): Payer: BC Managed Care – PPO

## 2010-12-17 LAB — CBC
HCT: 31.3 % — ABNORMAL LOW (ref 36.0–46.0)
Hemoglobin: 9.8 g/dL — ABNORMAL LOW (ref 12.0–15.0)
RBC: 3.36 MIL/uL — ABNORMAL LOW (ref 3.87–5.11)
WBC: 1.6 10*3/uL — ABNORMAL LOW (ref 4.0–10.5)

## 2010-12-17 LAB — DIFFERENTIAL
Basophils Relative: 1 % (ref 0–1)
Eosinophils Relative: 3 % (ref 0–5)
Monocytes Absolute: 0.1 10*3/uL (ref 0.1–1.0)
Monocytes Relative: 8 % (ref 3–12)
Neutrophils Relative %: 55 % (ref 43–77)

## 2010-12-17 LAB — COMPREHENSIVE METABOLIC PANEL
ALT: 110 U/L — ABNORMAL HIGH (ref 0–35)
Alkaline Phosphatase: 70 U/L (ref 39–117)
BUN: 3 mg/dL — ABNORMAL LOW (ref 6–23)
CO2: 29 mEq/L (ref 19–32)
Chloride: 99 mEq/L (ref 96–112)
Glucose, Bld: 105 mg/dL — ABNORMAL HIGH (ref 70–99)
Potassium: 3.5 mEq/L (ref 3.5–5.1)
Sodium: 134 mEq/L — ABNORMAL LOW (ref 135–145)
Total Bilirubin: 0.8 mg/dL (ref 0.3–1.2)
Total Protein: 7.3 g/dL (ref 6.0–8.3)

## 2010-12-17 LAB — PATHOLOGIST SMEAR REVIEW

## 2010-12-18 ENCOUNTER — Inpatient Hospital Stay (HOSPITAL_COMMUNITY): Payer: BC Managed Care – PPO

## 2010-12-19 LAB — CBC
Hemoglobin: 10.1 g/dL — ABNORMAL LOW (ref 12.0–15.0)
MCH: 29.6 pg (ref 26.0–34.0)
MCHC: 31.6 g/dL (ref 30.0–36.0)
MCV: 93.8 fL (ref 78.0–100.0)
Platelets: 70 10*3/uL — ABNORMAL LOW (ref 150–400)
RBC: 3.41 MIL/uL — ABNORMAL LOW (ref 3.87–5.11)

## 2010-12-19 LAB — COMPREHENSIVE METABOLIC PANEL
CO2: 29 mEq/L (ref 19–32)
Calcium: 9.2 mg/dL (ref 8.4–10.5)
Creatinine, Ser: <0.47 mg/dL — ABNORMAL LOW (ref 0.50–1.10)
Glucose, Bld: 174 mg/dL — ABNORMAL HIGH (ref 70–99)
Total Protein: 6.9 g/dL (ref 6.0–8.3)

## 2010-12-19 LAB — DIFFERENTIAL
Eosinophils Absolute: 0.1 10*3/uL (ref 0.0–0.7)
Lymphs Abs: 0.6 10*3/uL — ABNORMAL LOW (ref 0.7–4.0)
Monocytes Absolute: 0.3 10*3/uL (ref 0.1–1.0)
Monocytes Relative: 16 % — ABNORMAL HIGH (ref 3–12)
Neutrophils Relative %: 50 % (ref 43–77)

## 2010-12-24 NOTE — Discharge Summary (Signed)
NAMEJORDANA, [name]               ACCOUNT NO.:  [id number]  MEDICAL RECORD NO.:  [id number]  LOCATION:  1512                         FACILITY:  [facility]  PHYSICIAN:  Jeoffrey Massed, MD    DATE OF BIRTH:  1965-11-06  DATE OF ADMISSION:  12/14/2010 DATE OF DISCHARGE:  12/20/2010                        DISCHARGE SUMMARY - REFERRING   PRIMARY CARE PRACTITIONER:  Dr. Oliver Barre.  PRIMARY DISCHARGE DIAGNOSES: 1. Alcohol withdrawal. 2. Possibly alcoholic gastritis, significantly better. 3. Resolving pancytopenia, multifactorial, predominantly secondary to     alcohol and liver cirrhosis. 4. Resolving left periorbital hematoma secondary to fall.  CT of the     maxillofacial region showing a chronic orbital wall blowout     fracture.  There was no acute facial bone fracture.  SECONDARY DISCHARGE DIAGNOSES: 1. Long-standing history of EtOH abuse. 2. History of hepatitis C. 3. History of liver cirrhosis. 4. Prior history of pancreatitis.  CONSULTANTS ON THE CASE:  None.  BRIEF HISTORY OF PRESENT ILLNESS:  The patient is a 45 year old female with the above-noted medical problems, who was brought into the hospital on the August 24th, and with a desire to seek detox treatment for alcoholism.  For further details, please see the history and physical that was dictated by Dr. Della Goo on admission.  PERTINENT LABORATORY DATA: 1. Last CBC done on December 19, 2010, showed WBC of 2.1, hemoglobin of     10.1, and platelet count of 70. 2. LFTs done on December 19, 2010, shows an AST of 124, ALT of 106, and     alkaline phosphatase of 75. 3. Alcohol level on admission was 378.  PERTINENT RADIOLOGICAL STUDIES: 1. CT of the maxillofacial region showed no evidence of acute facial     bone fracture.  The patient does have a medial left orbital wall     blowout fracture, but this has a chronic appearance by CT. 2. CT of the head showed no acute intracranial abnormality. 3. Abdominal x-ray  two-view shows normal abdominal films.  BRIEF HOSPITAL COURSE: 1. EtOH withdrawal.  The patient was admitted to the hospitalist     service, placed on Ativan per CIWA protocol.  She was also given     MVI, thiamine, and folate.  She slowly improved.  She was seen by a     Child psychotherapist for substance abuse assessment.  With gradual     improvement, we have been able to taper off her benzodiazepines.     She has also been ambulatory today by herself and has been walking     the hallway.  Initially, Physical Therapy did evaluate her a few     days ago and they thought she might need home health PT; however,     because the patient has significantly improved today, they do not     think that the patient has any further requirements at home.  She     was again followed by clinical social worker today on the day of     discharge and the patient has refused inpatient rehab program, but     has agreed to go to [facility] for intensive     outpatient  program.  The patient has been provided all the     information about this program by a Child psychotherapist and the social     worker has asked the patient to go to [facility] to complete     the initial assessment.  I have also spoken with the patient in     detail and she claims that she will go there in the next few days     and do the needful paperwork, so that she can began outpatient     rehab. 2. Abdominal pain.  This was of unknown etiology.  Review of her prior     records did indicate that the patient had similar abdominal pain in     April of last year when she had an EGD that was essentially     negative.  She was given supportive therapy in the form of PPI and     sucralfate.  Her diet was very slowly advanced.  An abdominal x-ray     was done, which did not show any bowel obstruction or free air.     With clinical improvement, her diet was advanced and in fact she is     tolerating a low residual diet at the time of  discharge and is     being discharged home with Pepcid. 3. Constipation.  The patient does have a long-standing history of     constipation and has been prescribed lactulose.  She is to continue     taking over-the-counter MiraLax as well. 4. Pancytopenia.  This is secondary to either EtOH use and underlying     liver cirrhosis.  These numbers are slowly improving. 5. Liver cirrhosis.  This is secondary to hepatitis C and alcohol.     She is to follow with her primary care doctor for further continued     care. 6. Counseling.  The patient has been extensively counseled by me     numerous times during my rounds with her regarding the importance     of complete cessation of alcohol, given the fact that she does have     underlying liver cirrhosis.  She claims understanding and indicated     to me today that she will go to [facility] and     complete necessary paperwork to begin intensive outpatient rehab     program.  DISPOSITION:  It is deemed that the patient has met maximal benefit from hospital stay here and is being discharged home.  FOLLOWUP INSTRUCTIONS: 1. The patient is to follow up with the primary care practitioner, Dr.     Oliver Barre in the next 7 to 10 days. 2. The patient is to follow up at [facility] for     intensive outpatient program.  She claims understanding.  Total time spent for discharge, 45 minutes.     Jeoffrey Massed, MD     SG/MEDQ  D:  12/20/2010  T:  12/20/2010  Job:  [id number]  cc:   Corwin Levins, MD [address] [place] [address]  Electronically Signed by Jeoffrey Massed  on 12/24/2010 03:36:13 PM

## 2010-12-26 ENCOUNTER — Emergency Department (HOSPITAL_COMMUNITY)
Admission: EM | Admit: 2010-12-26 | Discharge: 2010-12-27 | Disposition: A | Discharge status: Home / Self Care | Payer: BC Managed Care – PPO | Attending: Emergency Medicine | Admitting: Emergency Medicine

## 2010-12-26 DIAGNOSIS — R109 Unspecified abdominal pain: Secondary | ICD-10-CM | POA: Insufficient documentation

## 2010-12-26 DIAGNOSIS — R1012 Left upper quadrant pain: Secondary | ICD-10-CM | POA: Insufficient documentation

## 2010-12-26 DIAGNOSIS — K7460 Unspecified cirrhosis of liver: Secondary | ICD-10-CM | POA: Insufficient documentation

## 2010-12-26 DIAGNOSIS — F17200 Nicotine dependence, unspecified, uncomplicated: Secondary | ICD-10-CM | POA: Insufficient documentation

## 2010-12-26 DIAGNOSIS — R110 Nausea: Secondary | ICD-10-CM | POA: Insufficient documentation

## 2010-12-26 DIAGNOSIS — B1920 Unspecified viral hepatitis C without hepatic coma: Secondary | ICD-10-CM | POA: Insufficient documentation

## 2010-12-26 DIAGNOSIS — F1010 Alcohol abuse, uncomplicated: Secondary | ICD-10-CM | POA: Insufficient documentation

## 2010-12-26 DIAGNOSIS — R748 Abnormal levels of other serum enzymes: Secondary | ICD-10-CM | POA: Insufficient documentation

## 2010-12-27 ENCOUNTER — Emergency Department (HOSPITAL_COMMUNITY): Payer: BC Managed Care – PPO

## 2010-12-27 LAB — COMPREHENSIVE METABOLIC PANEL
AST: 219 U/L — ABNORMAL HIGH (ref 0–37)
BUN: 6 mg/dL (ref 6–23)
CO2: 29 mEq/L (ref 19–32)
Calcium: 9.4 mg/dL (ref 8.4–10.5)
Chloride: 105 mEq/L (ref 96–112)
Creatinine, Ser: 0.71 mg/dL (ref 0.50–1.10)
GFR calc Af Amer: >60 mL/min (ref >60)
GFR calc non Af Amer: >60 mL/min (ref >60)
Glucose, Bld: 119 mg/dL — ABNORMAL HIGH (ref 70–99)
Total Bilirubin: 0.4 mg/dL (ref 0.3–1.2)

## 2010-12-27 LAB — CBC
HCT: 39 % (ref 36.0–46.0)
Hemoglobin: 12.4 g/dL (ref 12.0–15.0)
MCH: 29.2 pg (ref 26.0–34.0)
MCHC: 31.8 g/dL (ref 30.0–36.0)
MCV: 91.8 fL (ref 78.0–100.0)
RBC: 4.25 MIL/uL (ref 3.87–5.11)

## 2010-12-27 LAB — URINALYSIS, ROUTINE W REFLEX MICROSCOPIC
Bilirubin Urine: NEGATIVE
Ketones, ur: NEGATIVE mg/dL
Nitrite: NEGATIVE
Protein, ur: NEGATIVE mg/dL
pH: 5.5 (ref 5.0–8.0)

## 2010-12-27 LAB — LIPASE, BLOOD: Lipase: 64 U/L — ABNORMAL HIGH (ref 11–59)

## 2010-12-27 LAB — RAPID URINE DRUG SCREEN, HOSP PERFORMED
Amphetamines: NOT DETECTED
Cocaine: POSITIVE — AB
Opiates: NOT DETECTED

## 2010-12-27 LAB — DIFFERENTIAL
Basophils Relative: 2 % — ABNORMAL HIGH (ref 0–1)
Lymphs Abs: 2.2 10*3/uL (ref 0.7–4.0)
Monocytes Absolute: 0.5 10*3/uL (ref 0.1–1.0)
Monocytes Relative: 11 % (ref 3–12)
Neutro Abs: 1.7 10*3/uL (ref 1.7–7.7)
Neutrophils Relative %: 37 % — ABNORMAL LOW (ref 43–77)

## 2010-12-27 LAB — URINE MICROSCOPIC-ADD ON

## 2010-12-27 LAB — ETHANOL: Alcohol, Ethyl (B): 289 mg/dL — ABNORMAL HIGH (ref 0–11)

## 2010-12-27 LAB — AMMONIA: Ammonia: 16 umol/L (ref 11–60)

## 2010-12-28 ENCOUNTER — Emergency Department (HOSPITAL_COMMUNITY): Payer: BC Managed Care – PPO

## 2010-12-28 ENCOUNTER — Emergency Department (HOSPITAL_COMMUNITY)
Admission: EM | Admit: 2010-12-28 | Discharge: 2010-12-28 | Discharge status: Against Medical Advice | Payer: BC Managed Care – PPO | Source: Home / Self Care | Attending: Emergency Medicine | Admitting: Emergency Medicine

## 2010-12-28 ENCOUNTER — Emergency Department (HOSPITAL_COMMUNITY)
Admission: EM | Admit: 2010-12-28 | Discharge: 2010-12-29 | Disposition: A | Discharge status: Home / Self Care | Payer: BC Managed Care – PPO | Attending: Emergency Medicine | Admitting: Emergency Medicine

## 2010-12-28 DIAGNOSIS — R112 Nausea with vomiting, unspecified: Secondary | ICD-10-CM | POA: Insufficient documentation

## 2010-12-28 DIAGNOSIS — Z8619 Personal history of other infectious and parasitic diseases: Secondary | ICD-10-CM | POA: Insufficient documentation

## 2010-12-28 DIAGNOSIS — K7460 Unspecified cirrhosis of liver: Secondary | ICD-10-CM | POA: Insufficient documentation

## 2010-12-28 DIAGNOSIS — K8020 Calculus of gallbladder without cholecystitis without obstruction: Secondary | ICD-10-CM | POA: Insufficient documentation

## 2010-12-28 DIAGNOSIS — F1010 Alcohol abuse, uncomplicated: Secondary | ICD-10-CM | POA: Insufficient documentation

## 2010-12-28 DIAGNOSIS — K589 Irritable bowel syndrome without diarrhea: Secondary | ICD-10-CM | POA: Insufficient documentation

## 2010-12-28 DIAGNOSIS — K219 Gastro-esophageal reflux disease without esophagitis: Secondary | ICD-10-CM | POA: Insufficient documentation

## 2010-12-28 DIAGNOSIS — R109 Unspecified abdominal pain: Secondary | ICD-10-CM | POA: Insufficient documentation

## 2010-12-28 DIAGNOSIS — R630 Anorexia: Secondary | ICD-10-CM | POA: Insufficient documentation

## 2010-12-28 LAB — COMPREHENSIVE METABOLIC PANEL
AST: 140 U/L — ABNORMAL HIGH (ref 0–37)
Albumin: 3.2 g/dL — ABNORMAL LOW (ref 3.5–5.2)
Alkaline Phosphatase: 71 U/L (ref 39–117)
BUN: 7 mg/dL (ref 6–23)
CO2: 30 mEq/L (ref 19–32)
Chloride: 110 mEq/L (ref 96–112)
GFR calc non Af Amer: >60 mL/min (ref >60)
Potassium: 3.7 mEq/L (ref 3.5–5.1)
Total Bilirubin: 0.3 mg/dL (ref 0.3–1.2)

## 2010-12-28 LAB — DIFFERENTIAL
Lymphocytes Relative: 37 % (ref 12–46)
Lymphs Abs: 1.4 10*3/uL (ref 0.7–4.0)
Monocytes Relative: 7 % (ref 3–12)
Neutro Abs: 2 10*3/uL (ref 1.7–7.7)
Neutrophils Relative %: 53 % (ref 43–77)

## 2010-12-28 LAB — CBC
HCT: 33.4 % — ABNORMAL LOW (ref 36.0–46.0)
Hemoglobin: 10.6 g/dL — ABNORMAL LOW (ref 12.0–15.0)
MCH: 28.9 pg (ref 26.0–34.0)
MCV: 91 fL (ref 78.0–100.0)
RBC: 3.67 MIL/uL — ABNORMAL LOW (ref 3.87–5.11)

## 2010-12-28 LAB — POCT I-STAT TROPONIN I: Troponin i, poc: 0 ng/mL (ref 0.00–0.08)

## 2010-12-28 LAB — LACTIC ACID, PLASMA: Lactic Acid, Venous: 1.7 mmol/L (ref 0.5–2.2)

## 2010-12-28 MED ORDER — IOHEXOL 300 MG/ML  SOLN
100.0000 mL | Freq: Once | INTRAMUSCULAR | Status: AC | PRN
  Administered 2010-12-28: 100 mL via INTRAVENOUS

## 2011-01-01 ENCOUNTER — Emergency Department (HOSPITAL_COMMUNITY)
Admission: EM | Admit: 2011-01-01 | Discharge: 2011-01-02 | Disposition: A | Discharge status: Other Acute Inpatient Hospital | Payer: BC Managed Care – PPO | Attending: Emergency Medicine | Admitting: Emergency Medicine

## 2011-01-01 DIAGNOSIS — R10819 Abdominal tenderness, unspecified site: Secondary | ICD-10-CM | POA: Insufficient documentation

## 2011-01-01 DIAGNOSIS — F1010 Alcohol abuse, uncomplicated: Secondary | ICD-10-CM | POA: Insufficient documentation

## 2011-01-01 DIAGNOSIS — Z8619 Personal history of other infectious and parasitic diseases: Secondary | ICD-10-CM | POA: Insufficient documentation

## 2011-01-01 DIAGNOSIS — R1011 Right upper quadrant pain: Secondary | ICD-10-CM | POA: Insufficient documentation

## 2011-01-01 LAB — COMPREHENSIVE METABOLIC PANEL
ALT: 130 U/L — ABNORMAL HIGH (ref 0–35)
Albumin: 3.6 g/dL (ref 3.5–5.2)
Alkaline Phosphatase: 80 U/L (ref 39–117)
BUN: 3 mg/dL — ABNORMAL LOW (ref 6–23)
Potassium: 3.4 mEq/L — ABNORMAL LOW (ref 3.5–5.1)
Sodium: 141 mEq/L (ref 135–145)
Total Protein: 8.5 g/dL — ABNORMAL HIGH (ref 6.0–8.3)

## 2011-01-01 LAB — CBC
MCHC: 31.1 g/dL (ref 30.0–36.0)
RDW: 17.5 % — ABNORMAL HIGH (ref 11.5–15.5)

## 2011-01-01 LAB — DIFFERENTIAL
Basophils Absolute: 0.1 10*3/uL (ref 0.0–0.1)
Lymphocytes Relative: 41 % (ref 12–46)
Lymphs Abs: 3.2 10*3/uL (ref 0.7–4.0)
Monocytes Absolute: 0.7 10*3/uL (ref 0.1–1.0)
Monocytes Relative: 8 % (ref 3–12)
Neutro Abs: 3.6 10*3/uL (ref 1.7–7.7)

## 2011-01-01 LAB — ETHANOL: Alcohol, Ethyl (B): 302 mg/dL — ABNORMAL HIGH (ref 0–11)

## 2011-01-02 ENCOUNTER — Inpatient Hospital Stay (HOSPITAL_COMMUNITY)
Admission: AD | Admit: 2011-01-02 | Discharge: 2011-01-04 | DRG: 750 | Disposition: A | Discharge status: Home / Self Care | Payer: BC Managed Care – PPO | Source: Ambulatory Visit | Attending: Psychiatry | Admitting: Psychiatry

## 2011-01-02 DIAGNOSIS — K219 Gastro-esophageal reflux disease without esophagitis: Secondary | ICD-10-CM

## 2011-01-02 DIAGNOSIS — R079 Chest pain, unspecified: Secondary | ICD-10-CM

## 2011-01-02 DIAGNOSIS — Z79899 Other long term (current) drug therapy: Secondary | ICD-10-CM

## 2011-01-02 DIAGNOSIS — B1920 Unspecified viral hepatitis C without hepatic coma: Secondary | ICD-10-CM

## 2011-01-02 DIAGNOSIS — F1020 Alcohol dependence, uncomplicated: Secondary | ICD-10-CM

## 2011-01-02 DIAGNOSIS — K7460 Unspecified cirrhosis of liver: Secondary | ICD-10-CM

## 2011-01-02 DIAGNOSIS — F411 Generalized anxiety disorder: Secondary | ICD-10-CM

## 2011-01-02 DIAGNOSIS — K859 Acute pancreatitis without necrosis or infection, unspecified: Secondary | ICD-10-CM

## 2011-01-02 LAB — RAPID URINE DRUG SCREEN, HOSP PERFORMED
Amphetamines: NOT DETECTED
Benzodiazepines: POSITIVE — AB

## 2011-01-03 LAB — COMPREHENSIVE METABOLIC PANEL
ALT: 80 U/L — ABNORMAL HIGH (ref 0–35)
Calcium: 8.7 mg/dL (ref 8.4–10.5)
Creatinine, Ser: 0.48 mg/dL — ABNORMAL LOW (ref 0.50–1.10)
GFR calc Af Amer: >60 mL/min (ref >60)
Glucose, Bld: 116 mg/dL — ABNORMAL HIGH (ref 70–99)
Sodium: 137 mEq/L (ref 135–145)
Total Protein: 7.2 g/dL (ref 6.0–8.3)

## 2011-01-03 LAB — AMYLASE: Amylase: 32 U/L (ref 0–105)

## 2011-01-03 LAB — MAGNESIUM: Magnesium: 1.6 mg/dL (ref 1.5–2.5)

## 2011-01-03 LAB — LIPASE, BLOOD: Lipase: 59 U/L (ref 11–59)

## 2011-01-03 LAB — AMMONIA: Ammonia: 26 umol/L (ref 11–60)

## 2011-01-03 NOTE — Assessment & Plan Note (Signed)
[name], [name]               ACCOUNT NO.:  [id number]  MEDICAL RECORD NO.:  [id number]  LOCATION:  0306                          FACILITY:  [facility]  PHYSICIAN:  Orson Aloe, MD       DATE OF BIRTH:  [birth year]  DATE OF ADMISSION:  01/02/2011 DATE OF DISCHARGE:                      PSYCHIATRIC ADMISSION ASSESSMENT   DATE OF ASSESSMENT:  January 02, 2011, at 1600.  IDENTIFYING INFORMATION:  A 45 year old female.  This is a voluntary admission.  HISTORY OF THE PRESENT ILLNESS:  This is the 2nd or 3rd [facility] admission for [name] who has a history of significant alcohol abuse and presented in our emergency room complaining of acute chest pain for the past 3 weeks and was given 4 mg of morphine with good relief.  Her alcohol level was found to be 302 mg/dL, and potassium was mildly decreased at 3.4.  Urine drug screen positive for opiates and benzodiazepines.  She received a liter of fluids with thiamine and folate and medically stabilized.  She has had no recurrence of abdominal pain and was transferred to our unit around lunchtime today.  She has been reclusive and sleeping in the bed all afternoon.  Denies that she has any suicidal thoughts.  Has been quite sleepy.  Asking for help getting off the alcohol.  She incurred a DWI with a court date scheduled for November the 30th.  She denies other substance abuse.  She reports that she has been drinking for the last 4-5 months, and last drink was on the 11th of September when she had 2 shots of alcohol.  She has been drinking Four Loko drinks a day.  PAST PSYCHIATRIC HISTORY:  No current outpatient treatment.  She has a history of chronic alcohol use with medical complications and has been on our medical unit mid August and mid July of this year for intoxication.  In August, she also had complications of pancytopenia. She also has a history of delirium.  She has been treated with Lexapro in the past, but current medications are  unclear.  She was apparently prescribed Librium to help with withdrawal.  SOCIAL HISTORY:  This is a 45 year old female who is employed in [employer].  She is married with a recent DWI charge.  FAMILY HISTORY:  Not available.  MEDICAL HISTORY: 1. Primary care provider is unknown. 2. Medical problems are alcohol abuse, liver cirrhosis, pancytopenia,     history of hepatitis C and pancreatitis.  CURRENT MEDICATIONS: 1. Lactulose 10 grams 30 mL twice daily. 2. Folic acid 1 mg daily. 3. Multivitamins 1 tablet daily. 4. Polyethylene glycol 17 grams daily as needed p.r.n. for     constipation. 5. Thiamine 100 mg daily. 6. Hydrocodone/acetaminophen 5/500 mg 1 tablet q.4 hours p.r.n. pain. 7. Librium 25 mg two caps daily as directed, which she was apparently     taking for alcohol withdrawal.  DRUG ALLERGIES:  IODINE.  POSITIVE PHYSICAL FINDINGS:  Physical exam was done in the emergency room and is noted in the record.  As previously noted, her alcohol level was noted to be 302.  Urine drug screen positive for opiates andbenzodiazepines.  Comprehensive metabolic panel:  Sodium 141, potassium 3.4, chloride 101,  carbon dioxide 29, BUN 3, creatinine 0.53 and random glucose 125.  Hepatic function:  SGOT 197, SGPT 130, alkaline phosphatase 8, total bilirubin 0.5.  MENTAL STATUS EXAM:  This is a sleepy Caucasian female who attempts to cooperate but easily falls back to sleep.  Denies that she is suicidal. Says that she has been drinking a lot and wants help with detox.  No further complaints of pain.  Says that she has been able to keep small amounts of food and fluid down and asks if she can sleep for the rest of the day and night.  Cognitively, she is intact.  No signs of delirium or confusion.  Mood neutral.  Speech normal.  Axis I:  Alcohol dependence. Axis II:  Deferred. Axis III:  Hepatitis C, liver cirrhosis by history. Axis IV:  Significant for legal issues with a DWI. Axis  V:  Current 35, past year not known.  PLAN:  Voluntarily admit her with the goal of a safe detox in 4 days. We started her on a Librium protocol, and we are going to check a TSH, repeat her metabolic panel.  We will add a magnesium level.  When she is able to speak to [place] better, we will make decisions about restarting her polyethylene glycol and her lactulose.  Meanwhile, she is also getting supportive vitamins and medications for a safe detox.     Margaret A. Lorin Picket, N.P.   ______________________________ Orson Aloe, MD    MAS/MEDQ  D:  01/02/2011  T:  01/03/2011  Job:  [id number]  Electronically Signed by Kari Baars N.P. on 01/03/2011 09:50:20 AM Electronically Signed by Orson Aloe  on 01/03/2011 12:21:49 PM

## 2011-01-09 LAB — POCT CARDIAC MARKERS
CKMB, poc: <1 — ABNORMAL LOW
Myoglobin, poc: 28.4
Operator id: 4533
Operator id: 4533
Troponin i, poc: <0.05

## 2011-01-09 LAB — LIPASE, BLOOD: Lipase: 46

## 2011-01-09 LAB — ETHANOL
Alcohol, Ethyl (B): 202 — ABNORMAL HIGH
Alcohol, Ethyl (B): 393 — ABNORMAL HIGH

## 2011-01-09 LAB — COMPREHENSIVE METABOLIC PANEL
ALT: 167 — ABNORMAL HIGH
AST: 174 — ABNORMAL HIGH
Alkaline Phosphatase: 67
CO2: 28
Chloride: 109
GFR calc Af Amer: >60
GFR calc non Af Amer: >60
Glucose, Bld: 112 — ABNORMAL HIGH
Potassium: 3.9
Sodium: 146 — ABNORMAL HIGH

## 2011-01-09 LAB — CBC
Hemoglobin: 14.2
RBC: 4.36
WBC: 5.3

## 2011-01-09 LAB — RAPID URINE DRUG SCREEN, HOSP PERFORMED
Opiates: NOT DETECTED
Tetrahydrocannabinol: NOT DETECTED

## 2011-01-09 LAB — DIFFERENTIAL
Basophils Relative: 1
Eosinophils Absolute: 0.1
Eosinophils Relative: 2
Neutrophils Relative %: 44

## 2011-01-11 ENCOUNTER — Inpatient Hospital Stay (HOSPITAL_COMMUNITY)
Admission: EM | Admit: 2011-01-11 | Discharge: 2011-01-23 | DRG: 204 | Disposition: A | Discharge status: Home / Self Care | Payer: BC Managed Care – PPO | Attending: Internal Medicine | Admitting: Internal Medicine

## 2011-01-11 DIAGNOSIS — A498 Other bacterial infections of unspecified site: Secondary | ICD-10-CM | POA: Diagnosis present

## 2011-01-11 DIAGNOSIS — F1010 Alcohol abuse, uncomplicated: Secondary | ICD-10-CM | POA: Diagnosis present

## 2011-01-11 DIAGNOSIS — F1020 Alcohol dependence, uncomplicated: Secondary | ICD-10-CM | POA: Diagnosis present

## 2011-01-11 DIAGNOSIS — K7010 Alcoholic hepatitis without ascites: Secondary | ICD-10-CM | POA: Diagnosis present

## 2011-01-11 DIAGNOSIS — D696 Thrombocytopenia, unspecified: Secondary | ICD-10-CM | POA: Diagnosis present

## 2011-01-11 DIAGNOSIS — F1910 Other psychoactive substance abuse, uncomplicated: Secondary | ICD-10-CM | POA: Diagnosis present

## 2011-01-11 DIAGNOSIS — R7301 Impaired fasting glucose: Secondary | ICD-10-CM | POA: Diagnosis present

## 2011-01-11 DIAGNOSIS — D61811 Other drug-induced pancytopenia: Secondary | ICD-10-CM | POA: Diagnosis present

## 2011-01-11 DIAGNOSIS — E871 Hypo-osmolality and hyponatremia: Secondary | ICD-10-CM | POA: Diagnosis present

## 2011-01-11 DIAGNOSIS — B1920 Unspecified viral hepatitis C without hepatic coma: Secondary | ICD-10-CM | POA: Diagnosis present

## 2011-01-11 DIAGNOSIS — T510X4A Toxic effect of ethanol, undetermined, initial encounter: Secondary | ICD-10-CM | POA: Diagnosis present

## 2011-01-11 DIAGNOSIS — K861 Other chronic pancreatitis: Secondary | ICD-10-CM | POA: Diagnosis present

## 2011-01-11 DIAGNOSIS — D259 Leiomyoma of uterus, unspecified: Secondary | ICD-10-CM | POA: Diagnosis present

## 2011-01-11 DIAGNOSIS — T510X1A Toxic effect of ethanol, accidental (unintentional), initial encounter: Secondary | ICD-10-CM | POA: Diagnosis present

## 2011-01-11 DIAGNOSIS — K7030 Alcoholic cirrhosis of liver without ascites: Secondary | ICD-10-CM | POA: Diagnosis present

## 2011-01-11 DIAGNOSIS — G4733 Obstructive sleep apnea (adult) (pediatric): Secondary | ICD-10-CM | POA: Diagnosis present

## 2011-01-11 DIAGNOSIS — N12 Tubulo-interstitial nephritis, not specified as acute or chronic: Secondary | ICD-10-CM | POA: Diagnosis not present

## 2011-01-11 DIAGNOSIS — E876 Hypokalemia: Secondary | ICD-10-CM | POA: Diagnosis present

## 2011-01-11 DIAGNOSIS — R1011 Right upper quadrant pain: Secondary | ICD-10-CM | POA: Diagnosis present

## 2011-01-11 DIAGNOSIS — N200 Calculus of kidney: Secondary | ICD-10-CM | POA: Diagnosis present

## 2011-01-11 DIAGNOSIS — R509 Fever, unspecified: Secondary | ICD-10-CM | POA: Diagnosis not present

## 2011-01-11 DIAGNOSIS — K859 Acute pancreatitis without necrosis or infection, unspecified: Principal | ICD-10-CM | POA: Diagnosis present

## 2011-01-11 DIAGNOSIS — D72819 Decreased white blood cell count, unspecified: Secondary | ICD-10-CM | POA: Diagnosis present

## 2011-01-11 LAB — RAPID URINE DRUG SCREEN, HOSP PERFORMED
Amphetamines: NOT DETECTED
Benzodiazepines: POSITIVE — AB
Cocaine: POSITIVE — AB
Opiates: NOT DETECTED

## 2011-01-11 LAB — DIFFERENTIAL
Basophils Absolute: 0 10*3/uL (ref 0.0–0.1)
Eosinophils Absolute: 0.1 10*3/uL (ref 0.0–0.7)
Lymphs Abs: 1.5 10*3/uL (ref 0.7–4.0)
Monocytes Relative: 12 % (ref 3–12)
Neutro Abs: 1 10*3/uL — ABNORMAL LOW (ref 1.7–7.7)

## 2011-01-11 LAB — URINALYSIS, ROUTINE W REFLEX MICROSCOPIC
Bilirubin Urine: NEGATIVE
Glucose, UA: 100 mg/dL — AB
Hgb urine dipstick: NEGATIVE
Ketones, ur: NEGATIVE mg/dL
Protein, ur: NEGATIVE mg/dL
pH: 6.5 (ref 5.0–8.0)

## 2011-01-11 LAB — COMPREHENSIVE METABOLIC PANEL
ALT: 87 U/L — ABNORMAL HIGH (ref 0–35)
AST: 151 U/L — ABNORMAL HIGH (ref 0–37)
Albumin: 2.9 g/dL — ABNORMAL LOW (ref 3.5–5.2)
Calcium: 8.6 mg/dL (ref 8.4–10.5)
Creatinine, Ser: 0.59 mg/dL (ref 0.50–1.10)
GFR calc non Af Amer: >60 mL/min (ref >60)
Sodium: 141 mEq/L (ref 135–145)
Total Protein: 7.6 g/dL (ref 6.0–8.3)

## 2011-01-11 LAB — CBC
MCH: 27.9 pg (ref 26.0–34.0)
MCHC: 31.5 g/dL (ref 30.0–36.0)
MCV: 88.8 fL (ref 78.0–100.0)
Platelets: 113 10*3/uL — ABNORMAL LOW (ref 150–400)
RBC: 3.83 MIL/uL — ABNORMAL LOW (ref 3.87–5.11)
RDW: 16.9 % — ABNORMAL HIGH (ref 11.5–15.5)

## 2011-01-11 LAB — LIPASE, BLOOD: Lipase: 103 U/L — ABNORMAL HIGH (ref 11–59)

## 2011-01-11 LAB — URINE MICROSCOPIC-ADD ON

## 2011-01-12 LAB — GLUCOSE, CAPILLARY: Glucose-Capillary: 172 mg/dL — ABNORMAL HIGH (ref 70–99)

## 2011-01-12 LAB — POTASSIUM: Potassium: 4.1 mEq/L (ref 3.5–5.1)

## 2011-01-12 LAB — TSH: TSH: 1.228 u[IU]/mL (ref 0.350–4.500)

## 2011-01-13 LAB — CBC
HCT: 29.4 % — ABNORMAL LOW (ref 36.0–46.0)
Hemoglobin: 8.9 g/dL — ABNORMAL LOW (ref 12.0–15.0)
MCHC: 30.3 g/dL (ref 30.0–36.0)
RBC: 3.18 MIL/uL — ABNORMAL LOW (ref 3.87–5.11)
WBC: 3.8 10*3/uL — ABNORMAL LOW (ref 4.0–10.5)

## 2011-01-13 LAB — BASIC METABOLIC PANEL
Chloride: 104 mEq/L (ref 96–112)
GFR calc Af Amer: >60 mL/min (ref >60)
GFR calc non Af Amer: >60 mL/min (ref >60)
Glucose, Bld: 160 mg/dL — ABNORMAL HIGH (ref 70–99)
Potassium: 3.8 mEq/L (ref 3.5–5.1)
Sodium: 137 mEq/L (ref 135–145)

## 2011-01-14 LAB — CBC
HCT: 27.8 % — ABNORMAL LOW (ref 36.0–46.0)
Hemoglobin: 8.8 g/dL — ABNORMAL LOW (ref 12.0–15.0)
RBC: 3.11 MIL/uL — ABNORMAL LOW (ref 3.87–5.11)

## 2011-01-14 LAB — BASIC METABOLIC PANEL
Chloride: 102 mEq/L (ref 96–112)
Glucose, Bld: 146 mg/dL — ABNORMAL HIGH (ref 70–99)
Potassium: 3.5 mEq/L (ref 3.5–5.1)
Sodium: 134 mEq/L — ABNORMAL LOW (ref 135–145)

## 2011-01-15 LAB — CBC
HCT: 28.8 % — ABNORMAL LOW (ref 36.0–46.0)
MCV: 88.3 fL (ref 78.0–100.0)
RBC: 3.26 MIL/uL — ABNORMAL LOW (ref 3.87–5.11)
WBC: 2.3 10*3/uL — ABNORMAL LOW (ref 4.0–10.5)

## 2011-01-16 LAB — BASIC METABOLIC PANEL
BUN: <3 mg/dL — ABNORMAL LOW (ref 6–23)
Calcium: 9 mg/dL (ref 8.4–10.5)
Creatinine, Ser: <0.47 mg/dL — ABNORMAL LOW (ref 0.50–1.10)
Glucose, Bld: 149 mg/dL — ABNORMAL HIGH (ref 70–99)

## 2011-01-16 LAB — CBC
HCT: 31.3 % — ABNORMAL LOW (ref 36.0–46.0)
Hemoglobin: 9.5 g/dL — ABNORMAL LOW (ref 12.0–15.0)
MCH: 27.3 pg (ref 26.0–34.0)
MCHC: 30.4 g/dL (ref 30.0–36.0)
MCV: 89.9 fL (ref 78.0–100.0)

## 2011-01-17 LAB — CBC
MCH: 27.4 pg (ref 26.0–34.0)
MCV: 90.6 fL (ref 78.0–100.0)
Platelets: 92 10*3/uL — ABNORMAL LOW (ref 150–400)
RBC: 3.4 MIL/uL — ABNORMAL LOW (ref 3.87–5.11)
RDW: 16.1 % — ABNORMAL HIGH (ref 11.5–15.5)

## 2011-01-17 LAB — BASIC METABOLIC PANEL
CO2: 29 mEq/L (ref 19–32)
Calcium: 8.9 mg/dL (ref 8.4–10.5)
Creatinine, Ser: 0.58 mg/dL (ref 0.50–1.10)

## 2011-01-18 LAB — COMPREHENSIVE METABOLIC PANEL
AST: 74 U/L — ABNORMAL HIGH (ref 0–37)
Albumin: 2.9 g/dL — ABNORMAL LOW (ref 3.5–5.2)
Calcium: 9.3 mg/dL (ref 8.4–10.5)
Chloride: 98 mEq/L (ref 96–112)
Creatinine, Ser: <0.47 mg/dL — ABNORMAL LOW (ref 0.50–1.10)
Total Protein: 7.2 g/dL (ref 6.0–8.3)

## 2011-01-18 LAB — CBC
MCV: 88.1 fL (ref 78.0–100.0)
Platelets: 85 10*3/uL — ABNORMAL LOW (ref 150–400)
RDW: 16.7 % — ABNORMAL HIGH (ref 11.5–15.5)
WBC: 2.7 10*3/uL — ABNORMAL LOW (ref 4.0–10.5)

## 2011-01-18 LAB — HEMOGLOBIN A1C
Hgb A1c MFr Bld: 6.1 % — ABNORMAL HIGH (ref <5.7)
Mean Plasma Glucose: 128 mg/dL — ABNORMAL HIGH (ref <117)

## 2011-01-19 ENCOUNTER — Inpatient Hospital Stay (HOSPITAL_COMMUNITY): Payer: BC Managed Care – PPO

## 2011-01-19 LAB — URINALYSIS, ROUTINE W REFLEX MICROSCOPIC
Nitrite: NEGATIVE
Specific Gravity, Urine: 1.004 — ABNORMAL LOW (ref 1.005–1.030)
Urobilinogen, UA: 1 mg/dL (ref 0.0–1.0)

## 2011-01-19 LAB — URINE MICROSCOPIC-ADD ON

## 2011-01-20 ENCOUNTER — Inpatient Hospital Stay (HOSPITAL_COMMUNITY): Payer: BC Managed Care – PPO

## 2011-01-20 LAB — CBC
Hemoglobin: 10.2 g/dL — ABNORMAL LOW (ref 12.0–15.0)
MCHC: 32.7 g/dL (ref 30.0–36.0)
RDW: 17.2 % — ABNORMAL HIGH (ref 11.5–15.5)
WBC: 3.9 10*3/uL — ABNORMAL LOW (ref 4.0–10.5)

## 2011-01-20 LAB — COMPREHENSIVE METABOLIC PANEL
ALT: 36 U/L — ABNORMAL HIGH (ref 0–35)
AST: 42 U/L — ABNORMAL HIGH (ref 0–37)
Albumin: 2.8 g/dL — ABNORMAL LOW (ref 3.5–5.2)
Alkaline Phosphatase: 63 U/L (ref 39–117)
Chloride: 95 mEq/L — ABNORMAL LOW (ref 96–112)
Potassium: 3.8 mEq/L (ref 3.5–5.1)
Sodium: 127 mEq/L — ABNORMAL LOW (ref 135–145)
Total Bilirubin: 0.6 mg/dL (ref 0.3–1.2)
Total Protein: 7.4 g/dL (ref 6.0–8.3)

## 2011-01-20 LAB — LIPASE, BLOOD: Lipase: 13 U/L (ref 11–59)

## 2011-01-20 MED ORDER — IOHEXOL 300 MG/ML  SOLN
100.0000 mL | Freq: Once | INTRAMUSCULAR | Status: AC | PRN
  Administered 2011-01-20: 100 mL via INTRAVENOUS

## 2011-01-20 NOTE — Discharge Summary (Signed)
[name], [name]               ACCOUNT NO.:  [id number]  MEDICAL RECORD NO.:  [id number]  LOCATION:  1518                         FACILITY:  [facility]  PHYSICIAN:  Manson Passey, MD        DATE OF BIRTH:  June 18, 1965  DATE OF ADMISSION:  01/11/2011 DATE OF DISCHARGE:                              DISCHARGE SUMMARY   PRIMARY CARE PHYSICIAN:  Corwin Levins, MD  DISCHARGE DIAGNOSES: 1. Acute alcohol intoxication. 2. Hepatitis C. 3. Pancreatitis. 4. Uterine fibroids. 5. History of alcohol abuse.  DISCHARGE MEDICATIONS: 1. Percocet 5/325 mg 2 tablets every 6 hours as needed. 2. Folic acid 1 mg daily. 3. Lactulose 30 mL twice daily. 4. Multivitamin daily. 5. Pepcid 20 mg daily. 6. Thiamine 100 mg daily.  CONSULTATION:  None.  DIAGNOSTIC STUDIES:  None on this admission.  DISCHARGE LABS:  Sodium 137, potassium 3.8, chloride 104, bicarb 26, BUN 3, creatinine 0.47, glucose 149, calcium 9.  White blood cells 2.8, hemoglobin 9.5, hematocrit 31.3, platelet count 100.  Drug screen on September 21, urine toxicology positive for benzos and cocaine.  Alcohol level on January 11, 2011, was 333.  HISTORY OF PRESENT ILLNESS:  The patient is a 45 year old female with history of alcohol abuse, hepatitis C, chronic pancreatitis presented to emergency room for acute alcohol intoxication.  The patient had nausea, vomiting, and abdominal pain, but denied fever or chills.  The patient reported no blood in the stool or urine.  Drug screen was positive for cocaine and benzo abuse.  PHYSICAL EXAMINATION:  VITAL SIGNS:  Blood pressure 125/92, pulse 80, respirations 20, temperature 98.1 Fahrenheit, oxygen saturation 99% on 2 L nasal cannula. GENERAL APPEARANCE:  No acute distress, appears comfortable. LUNGS:  Clear to auscultation bilaterally, no wheezing. ABDOMEN:  Positive bowel sounds, soft; only mild tenderness elicited with deep palpation over epigastric area. CARDIOVASCULAR:  Positive S1,  S2, regular rate and rhythm. EXTREMITIES:  Pulses palpable bilaterally; no lower extremity edema. NEUROLOGICAL:  Alert, awake, oriented x3; no focal neurologic deficits. SKIN:  Warm, dry. NECK:  Supple, no lymphadenopathy.  HOSPITAL COURSE BY PROBLEM: 1. Acute alcoholic intoxication.  The patient was started on CIWA     protocol.  Over a course of few days, the patient has been     initially on n.p.o., which was advanced to clear liquid diet and on     the day of discharge, the patient tolerated solid food very well.     The patient will be sent home with multivitamin, folic acid,     thiamine.  Alcohol cessation was discussed extensively with the     patient.  She will report to drug/alcohol rehab, once she is     discharged. 2. Leukopenia, it appears that leukopenia is of chronic duration.  As     on presentation, white blood cells were 3000.  This is likely     secondary to alcohol abuse. 3. Pancreatitis.  The patient was initially n.p.o. with IV fluids and     analgesia with narcotics and she has progressed from n.p.o. to     clear liquid diet to solid food which she tolerates well.  We will  send home with Percocet 2 tablets every 4 to 6 hours as needed for     pain. 4. Chronic thrombocytopenia.  The patient's platelet count on     discharge was 100, which has trended up from the admission about     70.  There is no active signs of bleed.  CONDITION ON DISCHARGE:  The patient appears clinically well and medically stable to be discharged home.  More than 37 minutes has been spent discharging the patient.          ______________________________ Manson Passey, MD     AD/MEDQ  D:  01/16/2011  T:  01/17/2011  Job:  [id number]  Electronically Signed by Manson Passey MD on 01/20/2011 04:50:39 PM

## 2011-01-21 LAB — COMPREHENSIVE METABOLIC PANEL
ALT: 31 U/L (ref 0–35)
CO2: 26 mEq/L (ref 19–32)
Calcium: 9 mg/dL (ref 8.4–10.5)
Chloride: 97 mEq/L (ref 96–112)
Creatinine, Ser: 0.57 mg/dL (ref 0.50–1.10)
GFR calc Af Amer: >90 mL/min (ref >90)
GFR calc non Af Amer: >90 mL/min (ref >90)
Glucose, Bld: 151 mg/dL — ABNORMAL HIGH (ref 70–99)
Total Bilirubin: 0.5 mg/dL (ref 0.3–1.2)

## 2011-01-21 LAB — CBC
Hemoglobin: 9.7 g/dL — ABNORMAL LOW (ref 12.0–15.0)
MCH: 27.6 pg (ref 26.0–34.0)
MCV: 88.3 fL (ref 78.0–100.0)
RBC: 3.51 MIL/uL — ABNORMAL LOW (ref 3.87–5.11)
WBC: 2.9 10*3/uL — ABNORMAL LOW (ref 4.0–10.5)

## 2011-01-21 LAB — URINE CULTURE: Special Requests: NEGATIVE

## 2011-01-21 LAB — POCT PREGNANCY, URINE: Preg Test, Ur: NEGATIVE

## 2011-01-22 ENCOUNTER — Inpatient Hospital Stay (HOSPITAL_COMMUNITY): Payer: BC Managed Care – PPO

## 2011-01-22 LAB — BASIC METABOLIC PANEL
BUN: 3 mg/dL — ABNORMAL LOW (ref 6–23)
Chloride: 98 mEq/L (ref 96–112)
Creatinine, Ser: 0.49 mg/dL — ABNORMAL LOW (ref 0.50–1.10)
GFR calc Af Amer: >90 mL/min (ref >90)
GFR calc non Af Amer: >90 mL/min (ref >90)

## 2011-01-22 NOTE — Discharge Summary (Signed)
[name], [name]               ACCOUNT NO.:  [id number]  MEDICAL RECORD NO.:  [id number]  LOCATION:  0306                          FACILITY:  [facility]  PHYSICIAN:  Orson Aloe, MD       DATE OF BIRTH:  [birth year]  DATE OF ADMISSION:  01/02/2011 DATE OF DISCHARGE:  01/04/2011                              DISCHARGE SUMMARY   HISTORY OF PRESENT ILLNESS:  This is a voluntary admission for this 45- year-old female, the second or third of [facility] admission for Phung who has a history of significant alcohol abuse and presents to the emergency room complaining of acute chest pain for the past 3 weeks.  She was given 4 mg of morphine with good relief.  Her alcohol level was found to be 302.  Potassium was mildly depressed at 3.4.  Urine drug screen was positive for opiates and benzodiazepines.  She received a liter of fluids with thiamine and folate and was medically stabilized.  She had recurrence of abdominal pain and was transferred to our unit around lunch time.  She was then reclusive and sleeping in the bed all afternoon.  She denies any suicidal thoughts.  She has been quite sleepy, asking for help getting off the alcohol.  She incurred a DUI with a court date scheduled for November 30th.  She denies any other substance use problems.  She had been drinking for the last 4-5 months. Her last drink was on January 01, 2000 when she 2 shots of alcohol. She had been drinking 4 of the Four Loko drinks a day.  Medically, she had the alcohol level as mentioned above of 302.  Urine drug screen was positive for opiates and benzodiazepines.  Comprehensive metabolic panel had a sodium of 141, potassium of 3.4, chloride of 101, CO2 of 29, a BUN of 3, creatinine of 0.53 and random glucose of 125.  Hepatic functions were well above normal at SGOT 197, SGPT of 130.  Alkaline phosphatase was 8, and total bilirubin 0.5.  ADMISSION DIAGNOSES:  Axis I:  Alcohol dependence. Axis II:  Deferred. Axis III:   Hepatitis C and liver cirrhosis by history. Axis IV:  Significant legal issues as well. Axis V:  GAF 35.  HOSPITAL COURSE:  The patient was admitted and placed on a Librium detox as well as a TSA and magnesium level and CMET ordered.  Ensure 1 can 4 times a day was also started.  Amylase and lipase were also added to the morning labs.  Ammonia level was added to the morning labs as well.  On the 14th, she was discharged home with detox protocol continued and given prescriptions for low-dose Antabuse as well as follow-up at a chemical dependency intensive outpatient program.  In the hospital stay in the group meetings in the first discharge planning meeting, she was open with sharing her reasons for entering the hospital.  She states she has been drinking more daily over the last year.  She is not open to rehab wants to go back to [employer] daily.  She lives in her own home and has transportation.  The patient stated on the second day that she was feeling stable  enough to be discharged, and it seemed like that was an appropriate plan. CONDITION ON DISCHARGE:  The patient denied any suicidal or homicidal ideation.  She denied any hallucinations, illusions, or delusions.  She had good eye contact and was able to focus well enough in one-to-one and group settings.  She had clear goal-directed thoughts.  Her natural conversational speech had a typical volume, rate and tone.  She was oriented x4 and had recent and remote memory that was intact.  Her judgment:  She has known her father to have died from alcoholism but did not see herself as being "that bad."  Insight is improved significantly from admission, and she wants to be on some medications to make her sick if she were to drink again.  DISCHARGE DIAGNOSES:  Axis I:  Alcohol dependence.  Urine drug screen showing opiates and benzodiazepines. Axis II:  Deferred. Axis III:  Hepatitis C, liver cirrhosis and pancreatitis as well  as probable disinhibition from benzodiazepines as well as showing elevated pain perception from opiates and probably should never be on opiates either. Axis IV:  Moderate, legal issues, DUI, psychosocial issues related to substance use. Axis V:  GAF 50.  DISCHARGE RECOMMENDATIONS: 1. Return to typical activities. 2. Return to typical diet. 3. Continue the Librium protocol.  Take 25 mg tonight, the 14th, 25 mg     twice daily on the 15th, 25 mg at bedtime only on the 16th, and the     detox is finished. 4. Protonix 40 mg a day, folic acid 1 mg a day, lactulose solution to     help with her metabolism 30 mL per dose twice daily, multivitamins     1 tablet daily, polyethylene glycol 17 grams daily as necessary,     thiamine 100 mg. 5. She is also to follow up at [facility] alcohol     dependence intensive outpatient.  Dewayne Hatch is supposed to call her from     [phone] to get an appointment for her next week. 6. Also daily AA meetings are recommended. 7. She also needed laboratory work, mainly hepatic functioning before     her next prescription of Antabuse and consider starting Vivitrol     given 5 days of Antabuse prescription 1/2 tablet a day.  Vivitrol     was recommended because of her hepatic problems.          ______________________________ Orson Aloe, MD     EW/MEDQ  D:  01/18/2011  T:  01/18/2011  Job:  [id number]  Electronically Signed by Orson Aloe  on 01/22/2011 03:27:17 PM

## 2011-01-23 LAB — CBC
HCT: 32.2 % — ABNORMAL LOW (ref 36.0–46.0)
Hemoglobin: 10.1 g/dL — ABNORMAL LOW (ref 12.0–15.0)
MCH: 28 pg (ref 26.0–34.0)
MCV: 89.2 fL (ref 78.0–100.0)
Platelets: 113 10*3/uL — ABNORMAL LOW (ref 150–400)
RBC: 3.61 MIL/uL — ABNORMAL LOW (ref 3.87–5.11)
WBC: 2.3 10*3/uL — ABNORMAL LOW (ref 4.0–10.5)

## 2011-01-23 LAB — COMPREHENSIVE METABOLIC PANEL
ALT: 52 U/L — ABNORMAL HIGH (ref 0–35)
AST: 101 U/L — ABNORMAL HIGH (ref 0–37)
Albumin: 2.7 g/dL — ABNORMAL LOW (ref 3.5–5.2)
CO2: 29 mEq/L (ref 19–32)
Calcium: 9.6 mg/dL (ref 8.4–10.5)
Creatinine, Ser: <0.47 mg/dL — ABNORMAL LOW (ref 0.50–1.10)
Sodium: 135 mEq/L (ref 135–145)
Total Protein: 7.4 g/dL (ref 6.0–8.3)

## 2011-01-23 NOTE — Progress Notes (Signed)
NAMECALIN, [name]               ACCOUNT NO.:  [id number]  MEDICAL RECORD NO.:  [id number]  LOCATION:  1518                         FACILITY:  [facility]  PHYSICIAN:  Hillery Aldo, M.D.   DATE OF BIRTH:  [birth year]                                PROGRESS NOTE   DATE OF DISCHARGE: Pending.  PRIMARY CARE PHYSICIAN: Corwin Levins, MD.  UROLOGIST: Leighton Roach McDiarmid, M.D.  CURRENT DIAGNOSES: 1. Pyelonephritis secondary to Escherichia coli. 2. Left obstructing nephrolithiasis, status post urological     evaluation. 3. History of polysubstance abuse/alcoholism. 4. Hypokalemia. 5. Pancytopenia secondary to alcoholism. 6. Alcohol-induced hepatitis. 7. History of hepatitis C. 8. Cirrhosis of the liver. 9. Impaired fasting glucose 10.Probable obstructive sleep apnea, outpatient sleep study     recommended. 11.Hyponatremia. 12.Acute pancreatitis.  DISCHARGE MEDICATIONS: Will be dictated at the time of actual discharge.  CONSULTATIONS: Etta Grandchild, M.D. of Urology.  BRIEF ADMISSION HISTORY OF PRESENT ILLNESS: The patient is a 45 year old female with past medical history of polysubstance abuse, who was just discharged from alcohol rehab prior to readmission.  She presents to the emergency department intoxicated with nausea, vomiting, and abdominal pain.  Upon initial presentation, she was found to be hypokalemic and intoxicated and therefore was referred to the hospitalist service for further evaluation and treatment.  For the full details, please see the dictated report done by Dr. Conley Rolls.  PROCEDURES AND DIAGNOSTIC STUDIES: 1. Chest x-ray on January 19, 2011, showed no active cardiopulmonary     disease. 2. CT scan of the abdomen and pelvis on January 20, 2011, showed an     obstructing left upper ureteral calculus measuring 3.4 x 3.0 mm.     Focal pyelonephritis involving the left kidney.  Stable changes of     cirrhosis with splenomegaly and upper abdominal  lymphadenopathy.     Edema like changes around the gallbladder, may be due to low     albumin. 3. KUB on January 22, 2011, showed previously seen calculus possibly     sitting adjacent to the left transverse process of L3, but     difficult to ascertain for certain due to overlying stool and gas     and colon.  Most of the contrast in the colon from prior CT scan     has cleared.  Pelvic phleboliths noted.  DISCHARGE LABORATORY VALUES: Will be dictated at the time of actual discharge.  HOSPITAL COURSE PROBLEM: 1. Pyelonephritis secondary to Escherichia coli:  The patient     developed fevers on January 19, 2011, prompting an evaluation for     a septic workup.  Urinalysis was performed as well as chest     radiograph.  Chest x-ray was clear, but the urinalysis showed signs     of infection.  Subsequently was put on cefepime to cover the     possibility of GI infection.  Because of significant abdominal     pain, CT scan was obtained with findings as noted above.  Her     antibiotics were switched to Cipro and ultimately changed p.o.     Septra, given the propensity of E coli to form Cipro resistance.  At this point, the patient's pyelonephritis is being treated with     antibiotics and she is on day #3 of antibiotic therapy out of a     planned course of 10-14 days. 2. Left obstructing nephrolithiasis:  The patient's pain is more on     the right upper quadrant, but her stone is on the left, making it     difficult to conclude that her abdominal pain is from the stone.     Nevertheless, because it appeared to be obstructing the ureter, a     urology consultation was requested and kindly provided by Dr.     McDiarmid.  Dr. McDiarmid recommended treating the patient with     Flomax for 2 weeks, pain control, and antibiotics to treat the     pyelonephritis and that he would see her in follow up as an     outpatient in 1-2 weeks if this stone does not pass.  He did assure     [place]  that the stones in these cases pass 95% of the time and     therefore a conservative course of therapy is recommended. 3. Polysubstance abuse/alcoholism:  The patient presented intoxicated     and a urine drug screen was done on admission which was positive     for cocaine and benzodiazepines.  The patient adamantly denied     using cocaine, though she did admit to being around other people     who were abusing it.  The patient has just finished a course of     rehab and immediately relapsed but at this point is declining     further inpatient treatment.  Once she is stable for discharge, we     will refer her to alcohol and drug services for outpatient care.     She was detoxed with Ativan during early part of her hospital     course. 4. Hypokalemia.  The patient's potassium has been fully repleted. 5. Pancytopenia:  The patient's pancytopenia is likely due to the     toxic effects of alcohol in the bone marrow. 6. Alcoholic hepatitis/hepatitis C/cirrhosis:  The patient's LFTs have     completely normalized with abstinence. 7. Impaired fasting glucose:  Hemoglobin A1c was checked and found to     be 6.1%. 8. Probable obstructive sleep apnea:  The patient was placed on     nocturnal CPAP while in the hospital and will need a formal     outpatient sleep study scheduled at discharge. 9. Hyponatremia:  Felt to be due to cirrhosis physiology. 10.Pancreatitis:  Patient's lipase was monitored closely and she was     put on bowel rest to address her acute pancreatitis.  Her lipase     normalized with bowel rest.  Because she continues to complain of     some abdominal pain, plan is to repeat her lipase in the morning.     If it is elevated, put her back on bowel rest.  DISPOSITION: The patient is approaching medical stability for discharge.  We are transitioning her to p.o. antibiotics if she can tolerate these and her pain is under reasonable control, she can likely go home tomorrow.  We  are repeating a lipase to ensure that she does not have any evidence of recurrent pancreatitis.     Hillery Aldo, M.D.     CR/MEDQ  D:  01/22/2011  T:  01/22/2011  Job:  [id number]  cc:   Fayrene Fearing  Ellin Mayhew, MD [address] [place] [address]  Etta Grandchild, M.D.  Electronically Signed by Hillery Aldo M.D. on 01/23/2011 01:54:13 PM

## 2011-01-26 LAB — CULTURE, BLOOD (ROUTINE X 2)
Culture  Setup Time: 201209301129
Culture: NO GROWTH

## 2011-01-31 LAB — CBC
HCT: 40.9
HCT: 30.2 — ABNORMAL LOW
HCT: 31.4 — ABNORMAL LOW
Hemoglobin: 9.9 — ABNORMAL LOW
MCHC: 33.3
MCV: 87.1
MCV: 87.4
Platelets: 193
Platelets: 179
Platelets: 181
Platelets: 193
RBC: 4.6
RDW: 17.6 — ABNORMAL HIGH
RDW: 17.8 — ABNORMAL HIGH
RDW: 17.9 — ABNORMAL HIGH
WBC: 3.4 — ABNORMAL LOW
WBC: 3.1 — ABNORMAL LOW
WBC: 3.2 — ABNORMAL LOW

## 2011-01-31 LAB — URINALYSIS, ROUTINE W REFLEX MICROSCOPIC
Bilirubin Urine: NEGATIVE
Hgb urine dipstick: NEGATIVE
Specific Gravity, Urine: 1.02
Urobilinogen, UA: 0.2
pH: 5.5

## 2011-01-31 LAB — COMPREHENSIVE METABOLIC PANEL
AST: 390 — ABNORMAL HIGH
Albumin: 4.4
Albumin: 2.8 — ABNORMAL LOW
Alkaline Phosphatase: 79
BUN: 3 — ABNORMAL LOW
BUN: 1 — ABNORMAL LOW
CO2: 30
Chloride: 106
Creatinine, Ser: 0.57
GFR calc Af Amer: >60
GFR calc non Af Amer: >60
Glucose, Bld: 128 — ABNORMAL HIGH
Potassium: 3.7
Total Bilirubin: 0.8
Total Protein: 7.4

## 2011-01-31 LAB — HEPATIC FUNCTION PANEL
ALT: 136 — ABNORMAL HIGH
AST: 198 — ABNORMAL HIGH
Albumin: 2.8 — ABNORMAL LOW
Bilirubin, Direct: 0.2
Bilirubin, Direct: 0.3
Indirect Bilirubin: 0.5
Total Bilirubin: 0.6
Total Protein: 6.7

## 2011-01-31 LAB — FERRITIN: Ferritin: 19 (ref 10–291)

## 2011-01-31 LAB — IRON AND TIBC
Iron: 20 — ABNORMAL LOW
TIBC: 299
UIBC: 279

## 2011-01-31 LAB — CORTISOL-AM, BLOOD: Cortisol - AM: 11.4

## 2011-01-31 LAB — ETHANOL: Alcohol, Ethyl (B): 333 — ABNORMAL HIGH

## 2011-01-31 LAB — AMYLASE: Amylase: 40

## 2011-01-31 LAB — DIFFERENTIAL
Basophils Absolute: 0
Eosinophils Absolute: 0
Lymphs Abs: 1.2
Neutro Abs: 2

## 2011-01-31 LAB — HCV RNA QUANT: HCV Quantitative Log: 5.88 — ABNORMAL HIGH (ref <0.70)

## 2011-02-01 LAB — URINALYSIS, ROUTINE W REFLEX MICROSCOPIC
Glucose, UA: NEGATIVE
Glucose, UA: NEGATIVE
Ketones, ur: 15 — AB
Leukocytes, UA: NEGATIVE
Leukocytes, UA: NEGATIVE
Protein, ur: NEGATIVE
Specific Gravity, Urine: 1.006
Urobilinogen, UA: 1
pH: 6

## 2011-02-01 LAB — I-STAT 8, (EC8 V) (CONVERTED LAB)
Bicarbonate: 20.9
Glucose, Bld: 134 — ABNORMAL HIGH
TCO2: 21
pCO2, Ven: 19.5 — ABNORMAL LOW
pH, Ven: 7.639

## 2011-02-01 LAB — POCT PREGNANCY, URINE
Operator id: 277751
Preg Test, Ur: NEGATIVE

## 2011-02-01 LAB — COMPREHENSIVE METABOLIC PANEL
ALT: 197 — ABNORMAL HIGH
AST: 271 — ABNORMAL HIGH
Alkaline Phosphatase: 87
CO2: 26
Calcium: 9.1
Chloride: 109
GFR calc Af Amer: >60
GFR calc non Af Amer: >60
Glucose, Bld: 93
Potassium: 3.7
Sodium: 145

## 2011-02-01 LAB — DIFFERENTIAL
Basophils Relative: 1
Eosinophils Absolute: 0.1
Eosinophils Relative: 3
Lymphs Abs: 1.6

## 2011-02-01 LAB — AMYLASE
Amylase: 50
Amylase: 45

## 2011-02-01 LAB — URINE MICROSCOPIC-ADD ON

## 2011-02-01 LAB — CBC
Hemoglobin: 10.8 — ABNORMAL LOW
RBC: 3.7 — ABNORMAL LOW
WBC: 4

## 2011-02-01 LAB — URINE DRUGS OF ABUSE SCREEN W ALC, ROUTINE (REF LAB)
Amphetamine Screen, Ur: NEGATIVE
Barbiturate Quant, Ur: NEGATIVE
Cocaine Metabolites: NEGATIVE
Creatinine,U: 6.2
Ethyl Alcohol: <5
Phencyclidine (PCP): NEGATIVE

## 2011-02-01 LAB — HEPATIC FUNCTION PANEL
Alkaline Phosphatase: 66
Indirect Bilirubin: 0.7
Total Bilirubin: 1.2

## 2011-02-01 LAB — RAPID URINE DRUG SCREEN, HOSP PERFORMED
Barbiturates: NOT DETECTED
Cocaine: NOT DETECTED
Opiates: NOT DETECTED

## 2011-02-01 LAB — POCT I-STAT CREATININE: Creatinine, Ser: 0.5

## 2011-02-01 LAB — PROTIME-INR
INR: 1.1
Prothrombin Time: 14.8

## 2011-02-01 LAB — LIPASE, BLOOD: Lipase: 22

## 2011-02-01 LAB — PREGNANCY, URINE: Preg Test, Ur: NEGATIVE

## 2011-02-01 LAB — ETHANOL: Alcohol, Ethyl (B): <5

## 2011-02-02 ENCOUNTER — Inpatient Hospital Stay (HOSPITAL_COMMUNITY)
Admission: EM | Admit: 2011-02-02 | Discharge: 2011-02-06 | DRG: 218 | Disposition: A | Discharge status: Home Health | Payer: BC Managed Care – PPO | Source: Ambulatory Visit | Attending: Orthopedic Surgery | Admitting: Orthopedic Surgery

## 2011-02-02 ENCOUNTER — Emergency Department (HOSPITAL_COMMUNITY): Payer: BC Managed Care – PPO

## 2011-02-02 DIAGNOSIS — K219 Gastro-esophageal reflux disease without esophagitis: Secondary | ICD-10-CM | POA: Diagnosis present

## 2011-02-02 DIAGNOSIS — K589 Irritable bowel syndrome without diarrhea: Secondary | ICD-10-CM | POA: Diagnosis present

## 2011-02-02 DIAGNOSIS — F1410 Cocaine abuse, uncomplicated: Secondary | ICD-10-CM | POA: Diagnosis present

## 2011-02-02 DIAGNOSIS — S82853A Displaced trimalleolar fracture of unspecified lower leg, initial encounter for closed fracture: Principal | ICD-10-CM | POA: Diagnosis present

## 2011-02-02 DIAGNOSIS — X500XXA Overexertion from strenuous movement or load, initial encounter: Secondary | ICD-10-CM | POA: Diagnosis present

## 2011-02-02 DIAGNOSIS — K7460 Unspecified cirrhosis of liver: Secondary | ICD-10-CM | POA: Diagnosis present

## 2011-02-02 DIAGNOSIS — B1920 Unspecified viral hepatitis C without hepatic coma: Secondary | ICD-10-CM | POA: Diagnosis present

## 2011-02-02 DIAGNOSIS — F17200 Nicotine dependence, unspecified, uncomplicated: Secondary | ICD-10-CM | POA: Diagnosis present

## 2011-02-02 DIAGNOSIS — F1010 Alcohol abuse, uncomplicated: Secondary | ICD-10-CM | POA: Diagnosis present

## 2011-02-02 LAB — BASIC METABOLIC PANEL
BUN: 5 mg/dL — ABNORMAL LOW (ref 6–23)
CO2: 25 mEq/L (ref 19–32)
Calcium: 8.9 mg/dL (ref 8.4–10.5)
Chloride: 106 mEq/L (ref 96–112)
Creatinine, Ser: 0.51 mg/dL (ref 0.50–1.10)
GFR calc Af Amer: >90 mL/min (ref >90)
GFR calc non Af Amer: >90 mL/min (ref >90)
Glucose, Bld: 110 mg/dL — ABNORMAL HIGH (ref 70–99)
Potassium: 3.8 mEq/L (ref 3.5–5.1)
Sodium: 143 mEq/L (ref 135–145)

## 2011-02-02 LAB — DIFFERENTIAL
Basophils Absolute: 0 10*3/uL (ref 0.0–0.1)
Basophils Relative: 0 % (ref 0–1)
Eosinophils Absolute: 0.1 10*3/uL (ref 0.0–0.7)
Eosinophils Relative: 2 % (ref 0–5)
Lymphocytes Relative: 39 % (ref 12–46)
Lymphs Abs: 1.5 10*3/uL (ref 0.7–4.0)
Monocytes Absolute: 0.2 10*3/uL (ref 0.1–1.0)
Monocytes Relative: 6 % (ref 3–12)
Neutro Abs: 2.1 10*3/uL (ref 1.7–7.7)
Neutrophils Relative %: 54 % (ref 43–77)

## 2011-02-02 LAB — CBC
HCT: 39 % (ref 36.0–46.0)
Hemoglobin: 12.4 g/dL (ref 12.0–15.0)
MCH: 27.9 pg (ref 26.0–34.0)
MCHC: 31.8 g/dL (ref 30.0–36.0)
MCV: 87.6 fL (ref 78.0–100.0)
Platelets: 134 10*3/uL — ABNORMAL LOW (ref 150–400)
RBC: 4.45 MIL/uL (ref 3.87–5.11)
RDW: 17.3 % — ABNORMAL HIGH (ref 11.5–15.5)
WBC: 4 10*3/uL (ref 4.0–10.5)

## 2011-02-02 LAB — PROTIME-INR: INR: 1.3 (ref 0.00–1.49)

## 2011-02-02 NOTE — Discharge Summary (Signed)
  NAMESHELDA, [name]               ACCOUNT NO.:  [id number]  MEDICAL RECORD NO.:  [id number]  LOCATION:  1518                         FACILITY:  [facility]  PHYSICIAN:  Pleas Koch, MD        DATE OF BIRTH:  1965-10-08  DATE OF ADMISSION:  01/11/2011 DATE OF DISCHARGE:                              DISCHARGE SUMMARY   Please see job [id number] for full discharge summary.  DISCHARGE MEDICATIONS: 1. Lactulose 30 mL b.i.d. 2. Nutritional supplement resource, [employer] Liquid 240 mL b.i.d.     with meals. 3. Pepcid 20 mg 1 tablet daily. 4. Multivitamins 1 tablet daily. 5. Pantoprazole 40 mg 1 tablet q.h.s. 6. Tamsulosin 0.4 mg q.24 h. daily. 7. Trimethoprim sulfate, special instructions.  Diagnoses, Escherichia     coli urinary tract infection, currently on day #4 of systemic     therapy to complete 14 days of treatment, 22 tablets prescribed to     complete course of medication. 8. Percocet 5/325 mg 2 tablets q.6 h. p.r.n. 4 to 5 tablets prescribed. 9. Folic acid 1 mg 1 tablet daily. 10.Thiamine 1 tablet daily.  The patient was seen on the day of discharge, was doing well.  [name] had 6/10 to 7/10 pain, but overall states is on global scale of 1 to 10, 10 being her best.  [name] has been out of 7 for the past couple of days.  [name] had no other issues.  [name] is passing urine, passing stool.  [name] is not really passing stool, had no nausea, no vomiting.  [name] had no chest pain or shortness breath.  Vitals on day of discharge, temperature 98.1, pulse 63, respirations 16, and blood pressure of 100/57 to 71.  PERTINENT LABORATORY DATA:  Her hemoglobin was stable at 10.1.  [name] has mild pancytopenia, which is presently resolving her platelet count went up from 70 to 113.  Her AST to ALT ratio were slightly elevated today at 101/52, albumin 2.7, otherwise.  PHYSICAL EXAMINATION:  GENERAL:  [name] is an alert and pleasant lady in no acute distress.  Soft, slightly tender in the right CVA area. CHEST:   Clear. HEART:  S1 and S2.  No murmurs, rubs, or gallops. EXTREMITIES:  Soft and nontender.  The patient will need follow up with Dr. Sherron Monday, specifically with Urology as per prior dictation and the patient was given explicit instructions with regards to follow up in 1 week.  The patient will also need followup with her primary care physician, Dr. Oliver Barre from primary care standpoint.  The patient has [name] questionable obstructive sleep apnea diagnosed and is recommend to follow up with [facility].  The number has been placed in the chart.  This patient was deemed stable and fit for discharge and all questions were answered.  I spent less than 30 minutes in time coordinating her care and discharge.          ______________________________ Pleas Koch, MD     JS/MEDQ  D:  01/23/2011  T:  01/23/2011  Job:  [id number]  Electronically Signed by Pleas Koch MD on 02/02/2011 06:51:56 PM

## 2011-02-03 LAB — PROTIME-INR: Prothrombin Time: 15.8 seconds — ABNORMAL HIGH (ref 11.6–15.2)

## 2011-02-06 NOTE — Op Note (Signed)
  [name], [name]               ACCOUNT NO.:  [id number]  MEDICAL RECORD NO.:  [id number]  LOCATION:  5001                         FACILITY:  [facility]  PHYSICIAN:  Nadara Mustard, MD     DATE OF BIRTH:  04-26-1965  DATE OF PROCEDURE:  02/02/2011 DATE OF DISCHARGE:                              OPERATIVE REPORT   PREOPERATIVE DIAGNOSIS:  Trimalleolar fracture, displaced right ankle fracture.  POSTOP DIAGNOSIS:  Trimalleolar fracture, displaced right ankle fracture.  PROCEDURE:  Open reduction and internal fixation of right ankle, both medial and lateral malleolus.  SURGEON:  Nadara Mustard, MD  ANESTHESIA:  General plus popliteal block.  ESTIMATED BLOOD LOSS:  Minimal.  ANTIBIOTICS:  1 g of Kefzol.  DRAINS:  None.  COMPLICATION:  None.  TOURNIQUET TIME:  None.  DISPOSITION:  PACU in stable condition.  INDICATION FOR PROCEDURE:  The patient is a 45 year old woman who underwent a fracture dislocation of her right ankle.  She had tenting the skin on initial presentation to the emergency room and had pending skin breakdown due to the displacement of the ankle fracture.  She underwent sedation and closed reduction in the emergency room with splinted and then presents at this time for definitive open reduction and internal fixation.  Risks and benefits were discussed including infection, neurovascular injury, persistent pain, arthritis, DVT, pulmonary embolus, need for additional surgery.  The patient states she understands and wished to proceed at this time.  Goal is to minimize the risk for arthritis, minimize deformity to her ankle field and open reduction internal fixation should have a good chance of resolving these issues.  DESCRIPTION OF PROCEDURE:  The patient was brought to OR room 5 and underwent general anesthetic after a popliteal block.  After adequate anesthesia obtained, the patient's right lower extremity was prepped using ChloraPrep and draped into a  sterile field.  Stockinette was used to cover all exposed skin.  A lateral incision was made, carried down sharply to the fibula.  The fracture edges were freshened.  A lag screw was placed to secure the fracture and an antiglide posterior lateral plate was applied with a 7 hole 1/3 tubular, locked distally and compression screws proximally.  The wound was irrigated.  C-arm fluoroscopy verified alignment.  The subcu was closed using 0 Vicryl, skin was closed using proximate staples.  Attention was then focused to the medial malleolus.  Incision was made, this carried down to the fracture.  The fracture was freshened, reduced, and stabilized with 207 , 40 mm screws.  C-arm fluoroscopy verified reduction in both AP and lateral planes.  The wound was irrigated with normal saline.  Subcu was closed using 0 Vicryl and skin was closed using approximate staples. The wounds were covered with Adaptic, orthopedic sponges, ABD, Kerlix, and Coban.  The patient was extubated and taken to PACU in stable condition.     Nadara Mustard, MD     MVD/MEDQ  D:  02/02/2011  T:  02/02/2011  Job:  [id number]  Electronically Signed by Aldean Baker MD on 02/06/2011 06:30:34 AM

## 2011-02-06 NOTE — Op Note (Signed)
  NAMEJENYA, [name]               ACCOUNT NO.:  [id number]  MEDICAL RECORD NO.:  [id number]  LOCATION:  [facility]                         FACILITY:  [facility]  PHYSICIAN:  Nadara Mustard, MD     DATE OF BIRTH:  01-28-1966  DATE OF PROCEDURE:  02/02/2011 DATE OF DISCHARGE:                              OPERATIVE REPORT   PREOPERATIVE DIAGNOSIS:  Fracture dislocation, right ankle.  POSTOPERATIVE DIAGNOSIS:  Fracture dislocation, right ankle.  PROCEDURE:  Closed reduction with sedation in the emergency room.  SURGEON:  Nadara Mustard, MD.  ANESTHESIA:  Propofol with fentanyl.  DISPOSITION:  To the orthopedic floor in stable condition.  INDICATIONS FOR PROCEDURE:  The patient is a 45 year old woman with a fracture dislocation trimalleolar fracture of the right ankle, she has tenting of the skin, pending skin necrosis, injury occurred over 3 hours ago.  Due to risk of the skin breakdown and open wound and skin necrosis, the patient underwent an emergent closed reduction.  After informed consent, the patient underwent sedation with propofol and fentanyl.  After adequate anesthesia obtained, the patient underwent closed reduction.  She was placed in a posterior splint.  PLAN:  Plan for discharge to the orthopedic floor.  Anticipate the patient will require open reduction internal fixation of the trimalleolar right ankle fracture.     Nadara Mustard, MD     MVD/MEDQ  D:  02/02/2011  T:  02/02/2011  Job:  [id number]  Electronically Signed by Aldean Baker MD on 02/06/2011 06:30:30 AM

## 2011-02-06 NOTE — H&P (Signed)
NAMEMarland [name]  DIONA, PEREGOY NO.:  [id number]  MEDICAL RECORD NO.:  [id number]  LOCATION:  [facility]                         FACILITY:  [facility]  PHYSICIAN:  Houston Siren, MD           DATE OF BIRTH:  05-25-1965  DATE OF ADMISSION:  01/11/2011 DATE OF DISCHARGE:                             HISTORY & PHYSICAL   PRIMARY CARE PHYSICIAN:  Corwin Levins, MD  ADVANCE DIRECTIVE:  Full code.  REASON FOR ADMISSION:  Alcoholism, alcohol intoxication, and pancreatitis.  HISTORY OF PRESENT ILLNESS:  This is a 45 year old female with a history of alcoholism who just discharged from alcohol rehab, drank again and presented to the emergency room intoxicated having nausea, vomiting, and abdominal pain.  She denies fever, chills.  She has no black stool or bloody stool.  Evaluation in the emergency room shows lipase of 100, white count of 3000, hemoglobin of 10.7, potassium of 3.1, blood glucose 192, SGOT of 151, SGPT of 87.  Her urine drug screen showed the presence of cocaine and benzodiazepine.  She actually was given Antabuse, but she has not started on it.  She drinks Loko, which is a 12% alcoholic beverage.  She has no chest pain or shortness of breath, headache. Hospitalist was asked to admit the patient because of pancreatitis.  PAST MEDICAL HISTORY: 1. Alcoholism. 2. Hepatitis C. 3. Uterine fibroid. 4. Gallstone. 5. IBS. 6. History of prior pancreatitis.  PAST SURGICAL HISTORY: 1. Status post appendectomy. 2. Status post C-section. 3. Status post D and C in the past.  MEDICATIONS: 1. Folate. 2. Thiamine. 3. Librium 25 mg 2 tablets as needed. 4. Pepcid. 5. Omeprazole. 6. Lactulose.  SOCIAL HISTORY:  She is married, smoked rarely, and alcohol use as noted.  She stated her last cocaine use was 4 weeks ago.  FAMILY HISTORY:  Noncontributory.  PHYSICAL EXAMINATION:  VITAL SIGNS:  Blood pressure 130/85, pulse of 90, respiratory rate of 18, temperature 98.7. GENERAL:   She is alert and oriented and is in no apparent distress. HEENT:  Sclerae nonicteric. NECK:  Supple.  Throat clear.  No stridor. CARDIAC:  S1, S2.  Regular.  I did not hear any murmur, rub, or gallop. LUNGS:  Clear. ABDOMEN:  Slightly tender over the epigastric area.  No right upper quadrant pain.  No rebound.  Murphy's is negative.  She has no palpable mass.  Bowel sounds present. EXTREMITIES:  No edema.  She has no asterixis, good distal pulses bilaterally. SKIN:  Warm and dry.  No active withdrawal at this time.  OBJECTIVE FINDINGS:  As above.  Pertinent is white count of 3.0 thousand (probably secondary to alcohol marrow suppression), hemoglobin of 10.7. Potassium of 3.1.  Lipase of 100.  Urine drug screen showed the presence of cocaine and benzodiazepine.  IMPRESSION/PLAN:  This is a 45 year old with hepatitis C, alcoholism, admitted for alcohol intoxication and alcoholic pancreatitis.  She also has low potassium.  We will admit her, give dextrose along with antiemetic, replete her potassium, give her thiamine, and we will put her on CIWA protocol.  She is a full code, and will be admitted to [facility].  Houston Siren, MD     PL/MEDQ  D:  01/12/2011  T:  01/12/2011  Job:  [id number]  Electronically Signed by Houston Siren  on 02/05/2011 11:54:51 PM

## 2011-02-06 NOTE — Discharge Summary (Signed)
  [name], [name]               ACCOUNT NO.:  [id number]  MEDICAL RECORD NO.:  [id number]  LOCATION:  5001                         FACILITY:  [facility]  PHYSICIAN:  Nadara Mustard, MD          DATE OF BIRTH:  DATE OF ADMISSION:  02/02/2011 DATE OF DISCHARGE:  02/05/2011                              DISCHARGE SUMMARY   FINAL DIAGNOSIS:  Trimalleolar fracture dislocation, right ankle.  SURGICAL PROCEDURE:  Open reduction and internal fixation right ankle of both medial and lateral malleolus.  Discharged to home in stable condition, nonweightbearing right lower extremity.  Prescription for Percocet for pain.  She will continue her Coumadin as per her admission medical reconciliation form.  No change in her discharge medications as per the medical reconciliation form. Discharged to home in stable condition.  The patient's hospital course is essentially unremarkable.  She had delay in discharge secondary to her being slow with physical therapy.  The patient was felt to be safe with physical therapy at time of discharge with follow up in the office in 1 week.     Nadara Mustard, MD     MVD/MEDQ  D:  02/05/2011  T:  02/05/2011  Job:  [id number]  Electronically Signed by Aldean Baker MD on 02/06/2011 06:30:39 AM

## 2011-02-07 LAB — RAPID URINE DRUG SCREEN, HOSP PERFORMED
Amphetamines: NOT DETECTED
Cocaine: NOT DETECTED
Opiates: NOT DETECTED
Tetrahydrocannabinol: NOT DETECTED

## 2011-02-07 LAB — CBC
HCT: 35.5 — ABNORMAL LOW
Hemoglobin: 11.8 — ABNORMAL LOW
MCV: 91.6
RBC: 3.87
WBC: 3.2 — ABNORMAL LOW

## 2011-02-07 LAB — ETHANOL: Alcohol, Ethyl (B): 337 — ABNORMAL HIGH

## 2011-02-07 LAB — BASIC METABOLIC PANEL
BUN: 8
Chloride: 111
GFR calc non Af Amer: >60
Potassium: 3.3 — ABNORMAL LOW
Sodium: 145

## 2011-02-07 LAB — DIFFERENTIAL
Eosinophils Absolute: 0
Eosinophils Relative: 2
Lymphocytes Relative: 36
Lymphs Abs: 1.2
Monocytes Absolute: 0.4
Monocytes Relative: 12 — ABNORMAL HIGH

## 2011-02-07 LAB — LIPASE, BLOOD: Lipase: 42

## 2011-02-08 ENCOUNTER — Telehealth: Payer: Self-pay

## 2011-02-08 NOTE — Telephone Encounter (Signed)
HHRN called requesting clarification on GERD medication. Pt is Rx'd Pepsid, Protonix and Prevacid and per [facility], pt was not given a medication list when she was D/C's from the hospital last week. Please advise, if possible, what medication should pt be taking. (She will call back to schedule post hosp)

## 2011-02-12 ENCOUNTER — Ambulatory Visit: Payer: BC Managed Care – PPO | Admitting: Internal Medicine

## 2011-03-04 ENCOUNTER — Other Ambulatory Visit: Payer: Self-pay | Admitting: Family Medicine

## 2011-03-04 DIAGNOSIS — M79604 Pain in right leg: Secondary | ICD-10-CM

## 2011-03-05 ENCOUNTER — Ambulatory Visit
Admission: RE | Admit: 2011-03-05 | Discharge: 2011-03-05 | Disposition: A | Discharge status: Home / Self Care | Payer: BC Managed Care – PPO | Source: Ambulatory Visit | Attending: Family Medicine | Admitting: Family Medicine

## 2011-03-05 DIAGNOSIS — M79604 Pain in right leg: Secondary | ICD-10-CM

## 2011-04-23 HISTORY — PX: FOOT SURGERY: SHX648

## 2011-05-11 ENCOUNTER — Encounter (HOSPITAL_COMMUNITY): Payer: Self-pay | Admitting: Emergency Medicine

## 2011-05-11 ENCOUNTER — Emergency Department (HOSPITAL_COMMUNITY)
Admission: EM | Admit: 2011-05-11 | Discharge: 2011-05-11 | Disposition: A | Discharge status: Home / Self Care | Payer: BC Managed Care – PPO | Source: Home / Self Care | Attending: Family Medicine | Admitting: Family Medicine

## 2011-05-11 DIAGNOSIS — J40 Bronchitis, not specified as acute or chronic: Secondary | ICD-10-CM

## 2011-05-11 MED ORDER — ALBUTEROL SULFATE HFA 108 (90 BASE) MCG/ACT IN AERS: 2.0000 | INHALATION_SPRAY | RESPIRATORY_TRACT | Status: DC | PRN

## 2011-05-11 MED ORDER — GUAIFENESIN-CODEINE 100-10 MG/5ML PO SYRP: 5.0000 mL | ORAL_SOLUTION | Freq: Four times a day (QID) | ORAL | Status: AC | PRN

## 2011-05-11 NOTE — ED Provider Notes (Signed)
History     CSN: [id number]  Arrival date & time 05/11/11  0901   First MD Initiated Contact with Patient 05/11/11 [phone]      Chief Complaint  Patient presents with  . Cough    (Consider location/radiation/quality/duration/timing/severity/associated sxs/prior treatment) HPI Comments: [name] presents for evaluation of persistent non-productive cough since December. She reports that she and her husband have both had "colds." She quit smoking a year ago. She denies any fever. She reports lower rib cage pain, exacerbated by cough.   Patient is a 46 y.o. female presenting with cough. The history is provided by the patient.  Cough This is a new problem. The current episode started more than 1 week ago. The problem occurs constantly. The problem has not changed since onset.The cough is non-productive. There has been no fever. Associated symptoms include chest pain and wheezing. Pertinent negatives include no chills. She is not a smoker (former smoker).    Past Medical History  Diagnosis Date  . Impaired glucose tolerance 11/07/2010  . Hepatitis C approx dx 2005    no tx to date  . Pancreatitis     HX of  . Alcohol dependency   . GERD (gastroesophageal reflux disease)   . Chronic constipation   . Gallstones   . Drug dependency     Hx of cocaine use  . Depression   . Rheumatoid factor positive   . Hemochromatosis     last phlebotomy tx 2-3 yrs  . Pancytopenia   . Abnormal vaginal bleeding     uterine fibroid  . Gallstone 11/09/2010  . Chronic abdominal pain 11/09/2010  . Cirrhosis 11/09/2010    Past Surgical History  Procedure Date  . Appendectomy   . Cesarean section x 2    Family History  Problem Relation Age of Onset  . Cancer Mother     ovarian  . Diabetes Father     History  Substance Use Topics  . Smoking status: Former Games developer  . Smokeless tobacco: Not on file  . Alcohol Use: Yes    OB History    Grav Para Term Preterm Abortions TAB SAB Ect Mult Living               Review of Systems  Constitutional: Negative.  Negative for chills.  HENT: Positive for congestion.   Eyes: Negative.   Respiratory: Positive for cough and wheezing.   Cardiovascular: Positive for chest pain.  Gastrointestinal: Negative.   Genitourinary: Negative.   Musculoskeletal: Negative.   Skin: Negative.   Neurological: Negative.     Allergies  Iodinated diagnostic agents  Home Medications   Current Outpatient Rx  Name Route Sig Dispense Refill  . ALBUTEROL SULFATE HFA 108 (90 BASE) MCG/ACT IN AERS Inhalation Inhale 2 puffs into the lungs every 4 (four) hours as needed for wheezing or shortness of breath. 1 Inhaler 0  . FAMOTIDINE 40 MG PO TABS Oral Take 40 mg by mouth 2 (two) times daily.      Marland Kitchen GLUCOSE BLOOD VI STRP Other 1 each by Other route as needed. Use as instructed     . GUAIFENESIN-CODEINE 100-10 MG/5ML PO SYRP Oral Take 5 mLs by mouth every 6 (six) hours as needed for cough or congestion. 120 mL 0  . HYDROCODONE-ACETAMINOPHEN 5-500 MG PO CAPS Oral Take 1 capsule by mouth every 6 (six) hours as needed.      Marland Kitchen LANCETS MISC Does not apply by Does not apply route.      Marland Kitchen  OMEPRAZOLE 20 MG PO CPDR Oral Take 40 mg by mouth daily.      Marland Kitchen ZOLPIDEM TARTRATE 5 MG PO TABS Oral Take 5 mg by mouth at bedtime as needed.        BP 145/97  Pulse 91  Temp(Src) 98.6 F (37 C) (Oral)  Resp 18  SpO2 96%  LMP 05/11/2011  Physical Exam  Nursing note and vitals reviewed. Constitutional: She is oriented to person, place, and time. She appears well-developed and well-nourished.  HENT:  Head: Normocephalic and atraumatic.  Right Ear: Tympanic membrane is retracted.  Left Ear: Tympanic membrane is retracted.  Mouth/Throat: Uvula is midline, oropharynx is clear and moist and mucous membranes are normal.  Eyes: EOM are normal.  Neck: Normal range of motion.  Pulmonary/Chest: Effort normal. She has wheezes in the right upper field, the right middle field, the right  lower field, the left upper field, the left middle field and the left lower field. She has no rhonchi.  Musculoskeletal: Normal range of motion.  Neurological: She is alert and oriented to person, place, and time.  Skin: Skin is warm and dry.  Psychiatric: Her behavior is normal.    ED Course  Procedures (including critical care time)  Labs Reviewed - No data to display No results found.   1. Bronchitis       MDM  rx given for albuterol, guaifenesin AC        Richardo Priest, MD 05/11/11 [phone]

## 2011-05-11 NOTE — ED Notes (Signed)
Reports onset of symptoms in December.  Patient reports "cold" symptoms: runny nose, cough, watery eyes.  Pt thought symptoms were going away, but this past week woke with worse cough and pain in chest and back.  Points to right lower ribcage as location of pain and low back pain, pain occurs with cough

## 2011-09-17 ENCOUNTER — Ambulatory Visit (INDEPENDENT_AMBULATORY_CARE_PROVIDER_SITE_OTHER): Payer: BC Managed Care – PPO | Admitting: Internal Medicine

## 2011-09-17 ENCOUNTER — Encounter: Payer: Self-pay | Admitting: Internal Medicine

## 2011-09-17 ENCOUNTER — Other Ambulatory Visit (INDEPENDENT_AMBULATORY_CARE_PROVIDER_SITE_OTHER): Payer: BC Managed Care – PPO

## 2011-09-17 VITALS — BP 108/78 | HR 82 | Temp 97.1°F | Ht 62.0 in | Wt 156.1 lb

## 2011-09-17 DIAGNOSIS — F411 Generalized anxiety disorder: Secondary | ICD-10-CM

## 2011-09-17 DIAGNOSIS — Z0000 Encounter for general adult medical examination without abnormal findings: Secondary | ICD-10-CM

## 2011-09-17 DIAGNOSIS — E119 Type 2 diabetes mellitus without complications: Secondary | ICD-10-CM | POA: Insufficient documentation

## 2011-09-17 DIAGNOSIS — IMO0001 Reserved for inherently not codable concepts without codable children: Secondary | ICD-10-CM

## 2011-09-17 DIAGNOSIS — F419 Anxiety disorder, unspecified: Secondary | ICD-10-CM

## 2011-09-17 HISTORY — DX: Reserved for inherently not codable concepts without codable children: IMO0001

## 2011-09-17 LAB — CBC WITH DIFFERENTIAL/PLATELET
Basophils Relative: 1.1 % (ref 0.0–3.0)
Eosinophils Absolute: 0.1 10*3/uL (ref 0.0–0.7)
Eosinophils Relative: 2.3 % (ref 0.0–5.0)
HCT: 39.3 % (ref 36.0–46.0)
Lymphs Abs: 1.6 10*3/uL (ref 0.7–4.0)
MCHC: 33.1 g/dL (ref 30.0–36.0)
MCV: 99.6 fl (ref 78.0–100.0)
Monocytes Absolute: 0.3 10*3/uL (ref 0.1–1.0)
Platelets: 99 10*3/uL — ABNORMAL LOW (ref 150.0–400.0)
RBC: 3.94 Mil/uL (ref 3.87–5.11)
WBC: 3.7 10*3/uL — ABNORMAL LOW (ref 4.5–10.5)

## 2011-09-17 LAB — HEPATIC FUNCTION PANEL
AST: 297 U/L — ABNORMAL HIGH (ref 0–37)
Bilirubin, Direct: 0.3 mg/dL (ref 0.0–0.3)
Total Bilirubin: 0.9 mg/dL (ref 0.3–1.2)

## 2011-09-17 LAB — LIPID PANEL
HDL: 94.5 mg/dL (ref >39.00)
Total CHOL/HDL Ratio: 2
VLDL: 14 mg/dL (ref 0.0–40.0)

## 2011-09-17 LAB — BASIC METABOLIC PANEL
BUN: 8 mg/dL (ref 6–23)
Chloride: 105 mEq/L (ref 96–112)
GFR: 118.88 mL/min (ref >60.00)
Potassium: 3.6 mEq/L (ref 3.5–5.1)

## 2011-09-17 LAB — URINALYSIS, ROUTINE W REFLEX MICROSCOPIC
Ketones, ur: NEGATIVE
pH: 6 (ref 5.0–8.0)

## 2011-09-17 LAB — MICROALBUMIN / CREATININE URINE RATIO
Creatinine,U: 247.8 mg/dL
Microalb, Ur: 3.3 mg/dL — ABNORMAL HIGH (ref 0.0–1.9)

## 2011-09-17 MED ORDER — ESCITALOPRAM OXALATE 10 MG PO TABS: 10.0000 mg | ORAL_TABLET | Freq: Every day | ORAL | Status: DC

## 2011-09-17 MED ORDER — METFORMIN HCL 500 MG PO TABS: 500.0000 mg | ORAL_TABLET | Freq: Two times a day (BID) | ORAL | Status: DC

## 2011-09-17 NOTE — Assessment & Plan Note (Signed)
Ok for lexapro 10 qd 

## 2011-09-17 NOTE — Patient Instructions (Signed)
OK to start the lexapro 10 mg per day OK to decrease the metformin to 500 mg twice per day Please continue to monitor you blood sugars as you do Continue all other medications as before Please go to LAB in the Basement for the blood and/or urine tests to be done today You will be contacted by phone if any changes need to be made immediately.  Otherwise, you will receive a letter about your results with an explanation. Please return in 3 mo with Lab testing done 3-5 days before

## 2011-09-17 NOTE — Assessment & Plan Note (Addendum)
Mild uncotnroled but stable overall by hx and exam, with cbg' in the 150's. most recent data reviewed with pt, and pt to continue medical treatment as before, except to decr the metformin to 500 bid Lab Results  Component Value Date   HGBA1C 6.1* 01/18/2011

## 2011-09-17 NOTE — Assessment & Plan Note (Signed)

## 2011-09-17 NOTE — Progress Notes (Signed)
Subjective:    Patient ID: [name], female    DOB: 1966/01/07, 46 y.o.   MRN: [id number]  HPI  Here for wellness and f/u;  Overall doing ok;  Pt denies CP, worsening SOB, DOE, wheezing, orthopnea, PND, worsening LE edema, palpitations, dizziness or syncope.  Pt denies neurological change such as new Headache, facial or extremity weakness.  Pt denies polydipsia, polyuria, or low sugar symptoms. Pt states overall good compliance with treatment and medications, good tolerability, and trying to follow lower cholesterol diet.  Pt denies worsening depressive symptoms, suicidal ideation or panic. No fever, wt loss, night sweats, loss of appetite, or other constitutional symptoms.  Pt states good ability with ADL's, low fall risk, home safety reviewed and adequate, no significant changes in hearing or vision, and occasionally active with exercise.  Has been seen per [facility] liver transplant service who states her DM needs to be better controlled, but she is unable to take the metformin 1000 bid prescribed due to nausea/GI upset and several low sugars..  Also with uncontrolled anxety and mild tremulousness. Has been trying to stay away from ETOH in light of her liver dz.  Zoloft was "too strong" in the past. Past Medical History  Diagnosis Date  . Impaired glucose tolerance 11/07/2010  . Hepatitis C approx dx 2005    no tx to date  . Pancreatitis     HX of  . Alcohol dependency   . GERD (gastroesophageal reflux disease)   . Chronic constipation   . Gallstones   . Drug dependency     Hx of cocaine use  . Depression   . Rheumatoid factor positive   . Hemochromatosis     last phlebotomy tx 2-3 yrs  . Pancytopenia   . Abnormal vaginal bleeding     uterine fibroid  . Gallstone 11/09/2010  . Chronic abdominal pain 11/09/2010  . Cirrhosis 11/09/2010   Past Surgical History  Procedure Date  . Appendectomy   . Cesarean section x 2    reports that she has quit smoking. She does not have any smokeless  tobacco history on file. She reports that she drinks alcohol. She reports that she does not use illicit drugs. family history includes Cancer in her mother and Diabetes in her father. Allergies  Allergen Reactions  . Iodinated Diagnostic Agents Hives    Pt states allergic to Betadine.   Current Outpatient Prescriptions on File Prior to Visit  Medication Sig Dispense Refill  . albuterol (PROVENTIL HFA;VENTOLIN HFA) 108 (90 BASE) MCG/ACT inhaler Inhale 2 puffs into the lungs every 4 (four) hours as needed for wheezing or shortness of breath.  1 Inhaler  0  . famotidine (PEPCID) 40 MG tablet Take 40 mg by mouth 2 (two) times daily.        Marland Kitchen glucose blood test strip 1 each by Other route as needed. Use as instructed       . hydrocodone-acetaminophen (LORCET-HD) 5-500 MG per capsule Take 1 capsule by mouth every 6 (six) hours as needed.        . Lancets MISC by Does not apply route.        Marland Kitchen omeprazole (PRILOSEC) 20 MG capsule Take 40 mg by mouth daily.        Marland Kitchen zolpidem (AMBIEN) 5 MG tablet Take 5 mg by mouth at bedtime as needed.         Review of Systems Review of Systems  Constitutional: Negative for diaphoresis, activity change, appetite change and  unexpected weight change.  HENT: Negative for hearing loss, ear pain, facial swelling, mouth sores and neck stiffness.   Eyes: Negative for pain, redness and visual disturbance.  Respiratory: Negative for shortness of breath and wheezing.   Cardiovascular: Negative for chest pain and palpitations.  Gastrointestinal: Negative for diarrhea, blood in stool, abdominal distention and rectal pain.  Genitourinary: Negative for hematuria, flank pain and decreased urine volume.  Musculoskeletal: Negative for myalgias and joint swelling.  Skin: Negative for color change and wound.  Neurological: Negative for syncope and numbness.  Hematological: Negative for adenopathy.  Psychiatric/Behavioral: Negative for hallucinations, self-injury, decreased  concentration and agitation.      Objective:   Physical Exam BP 108/78  Pulse 82  Temp(Src) 97.1 F (36.2 C) (Oral)  Ht 5\' 2"  (1.575 m)  Wt 156 lb 2 oz (70.818 kg)  BMI 28.56 kg/m2  SpO2 94% Physical Exam  VS noted Constitutional: Pt is oriented to person, place, and time. Appears well-developed and well-nourished.  HENT:  Head: Normocephalic and atraumatic.  Right Ear: External ear normal.  Left Ear: External ear normal.  Nose: Nose normal.  Mouth/Throat: Oropharynx is clear and moist.  Eyes: Conjunctivae and EOM are normal. Pupils are equal, round, and reactive to light.  Neck: Normal range of motion. Neck supple. No JVD present. No tracheal deviation present.  Cardiovascular: Normal rate, regular rhythm, normal heart sounds and intact distal pulses.   Pulmonary/Chest: Effort normal and breath sounds normal.  Abdominal: Soft. Bowel sounds are normal. There is no tenderness.  Musculoskeletal: Normal range of motion. Exhibits no edema.  Lymphadenopathy:  Has no cervical adenopathy.  Neurological: Pt is alert and oriented to person, place, and time. Pt has normal reflexes. No cranial nerve deficit.  Skin: Skin is warm and dry. No rash noted.  Psychiatric:  Has  normal mood and affect. Behavior is normal. except for 1+ nervous    Assessment & Plan:

## 2011-09-18 ENCOUNTER — Encounter: Payer: Self-pay | Admitting: Internal Medicine

## 2011-10-07 ENCOUNTER — Encounter (HOSPITAL_COMMUNITY): Payer: Self-pay | Admitting: *Deleted

## 2011-10-07 ENCOUNTER — Emergency Department (HOSPITAL_COMMUNITY)
Admission: EM | Admit: 2011-10-07 | Discharge: 2011-10-08 | Disposition: A | Discharge status: Home / Self Care | Payer: BC Managed Care – PPO | Attending: Emergency Medicine | Admitting: Emergency Medicine

## 2011-10-07 DIAGNOSIS — M25569 Pain in unspecified knee: Secondary | ICD-10-CM | POA: Insufficient documentation

## 2011-10-07 NOTE — ED Notes (Signed)
Pt presents w/ left knee pain and redness - pt states "I jumped into the pool and it started hurting" pt denies any mechanism of injury. CMS intact.

## 2011-10-08 NOTE — ED Notes (Signed)
Pt. called from waiting room, restroom checked, and outside checked. Pt. not present. RN Sharrie Rothman. notified

## 2011-10-08 NOTE — ED Notes (Signed)
Pt not found.

## 2011-10-14 ENCOUNTER — Telehealth: Payer: Self-pay

## 2011-10-14 NOTE — Telephone Encounter (Signed)
Ok with me 

## 2011-10-14 NOTE — Telephone Encounter (Signed)
Pt called requesting nurse visit for Hep A and B vaccinations, okay to schedule?

## 2011-10-14 NOTE — Telephone Encounter (Signed)
Called the patient to inform ok, but no answer and no vm.

## 2011-10-15 NOTE — Telephone Encounter (Signed)
Information routed to scheduler to call and set up

## 2011-10-18 NOTE — Telephone Encounter (Signed)
Phone rings, no answer or voice mail.

## 2011-10-22 ENCOUNTER — Telehealth: Payer: Self-pay | Admitting: Internal Medicine

## 2011-10-22 NOTE — Telephone Encounter (Signed)
Caller: Josiah/Patient; PCP: Oliver Barre; Call regarding Returning Call To Office/Robin. She Is Needing To Find Out If She Can Have Immunizations for Hep A. and Hep B. Done in the Office; She is getting Hep C series done through [facility]. PLEASE LET HER KNOW AND CALL BACK FOR APPNT.  CB#: [phone];

## 2011-10-22 NOTE — Telephone Encounter (Signed)
Called the patient back no answer and no vm.

## 2011-10-23 NOTE — Telephone Encounter (Signed)
Called patient back no answer and no vm to leave message to call back

## 2011-10-23 NOTE — Telephone Encounter (Signed)
Called the patient no answer and no vm to leave message.

## 2011-11-22 ENCOUNTER — Inpatient Hospital Stay (HOSPITAL_COMMUNITY): Payer: BC Managed Care – PPO

## 2011-11-22 ENCOUNTER — Encounter (HOSPITAL_COMMUNITY): Payer: Self-pay

## 2011-11-22 ENCOUNTER — Observation Stay (HOSPITAL_COMMUNITY)
Admission: AD | Admit: 2011-11-22 | Discharge: 2011-11-23 | Disposition: A | Discharge status: Home / Self Care | Payer: BC Managed Care – PPO | Source: Ambulatory Visit | Attending: Obstetrics and Gynecology | Admitting: Obstetrics and Gynecology

## 2011-11-22 DIAGNOSIS — D649 Anemia, unspecified: Secondary | ICD-10-CM

## 2011-11-22 DIAGNOSIS — N921 Excessive and frequent menstruation with irregular cycle: Secondary | ICD-10-CM

## 2011-11-22 DIAGNOSIS — K5900 Constipation, unspecified: Secondary | ICD-10-CM

## 2011-11-22 DIAGNOSIS — D500 Iron deficiency anemia secondary to blood loss (chronic): Secondary | ICD-10-CM | POA: Insufficient documentation

## 2011-11-22 DIAGNOSIS — D219 Benign neoplasm of connective and other soft tissue, unspecified: Secondary | ICD-10-CM

## 2011-11-22 DIAGNOSIS — N949 Unspecified condition associated with female genital organs and menstrual cycle: Principal | ICD-10-CM | POA: Insufficient documentation

## 2011-11-22 DIAGNOSIS — N938 Other specified abnormal uterine and vaginal bleeding: Principal | ICD-10-CM | POA: Insufficient documentation

## 2011-11-22 LAB — URINE MICROSCOPIC-ADD ON

## 2011-11-22 LAB — URINALYSIS, ROUTINE W REFLEX MICROSCOPIC
Bilirubin Urine: NEGATIVE
Glucose, UA: NEGATIVE mg/dL
Ketones, ur: NEGATIVE mg/dL
Leukocytes, UA: NEGATIVE
pH: 7 (ref 5.0–8.0)

## 2011-11-22 LAB — CBC
Hemoglobin: 7.2 g/dL — ABNORMAL LOW (ref 12.0–15.0)
MCHC: 30.8 g/dL (ref 30.0–36.0)
RBC: 2.42 MIL/uL — ABNORMAL LOW (ref 3.87–5.11)

## 2011-11-22 NOTE — MAU Note (Signed)
Pt states, " I've had vaginal bleeding for three weeks after not having a period for three months. I've been passing small clots the size of quarter for the whole time. I'm starting to feel weak. "

## 2011-11-22 NOTE — MAU Note (Signed)
Patient is in with c/o 3 weeks of heavy vaginal bleeding with clots and abdominal pain. There is no blood in the pad that she have on now. She states that she have an appt with dr Duane Lope on august 8th but could not wait due to the pain. She denies feeling dizzy.

## 2011-11-23 DIAGNOSIS — N920 Excessive and frequent menstruation with regular cycle: Secondary | ICD-10-CM

## 2011-11-23 LAB — COMPREHENSIVE METABOLIC PANEL
BUN: 3 mg/dL — ABNORMAL LOW (ref 6–23)
CO2: 25 mEq/L (ref 19–32)
Calcium: 8.6 mg/dL (ref 8.4–10.5)
Chloride: 103 mEq/L (ref 96–112)
Creatinine, Ser: 0.65 mg/dL (ref 0.50–1.10)
GFR calc non Af Amer: >90 mL/min (ref >90)
Total Bilirubin: 0.3 mg/dL (ref 0.3–1.2)

## 2011-11-23 LAB — CBC
Platelets: 78 10*3/uL — ABNORMAL LOW (ref 150–400)
RBC: 2.8 MIL/uL — ABNORMAL LOW (ref 3.87–5.11)
RDW: 17.5 % — ABNORMAL HIGH (ref 11.5–15.5)
WBC: 2.5 10*3/uL — ABNORMAL LOW (ref 4.0–10.5)

## 2011-11-23 LAB — GLUCOSE, CAPILLARY
Glucose-Capillary: 135 mg/dL — ABNORMAL HIGH (ref 70–99)
Glucose-Capillary: 114 mg/dL — ABNORMAL HIGH (ref 70–99)

## 2011-11-23 MED ORDER — SODIUM CHLORIDE 0.9 % IV SOLN
INTRAVENOUS | Status: DC
  Administered 2011-11-23: 02:00:00 via INTRAVENOUS

## 2011-11-23 MED ORDER — MEDROXYPROGESTERONE ACETATE 10 MG PO TABS: 5.0000 mg | ORAL_TABLET | Freq: Every day | ORAL | Status: DC

## 2011-11-23 MED ORDER — ZOLPIDEM TARTRATE 5 MG PO TABS: 5.0000 mg | ORAL_TABLET | Freq: Every evening | ORAL | Status: DC | PRN

## 2011-11-23 MED ORDER — METFORMIN HCL 500 MG PO TABS
1000.0000 mg | ORAL_TABLET | Freq: Two times a day (BID) | ORAL | Status: DC
  Administered 2011-11-23: 1000 mg via ORAL
  Filled 2011-11-23 (×3): qty 2

## 2011-11-23 MED ORDER — PANTOPRAZOLE SODIUM 40 MG PO TBEC
40.0000 mg | DELAYED_RELEASE_TABLET | Freq: Every day | ORAL | Status: DC
  Filled 2011-11-23 (×2): qty 1

## 2011-11-23 MED ORDER — IBUPROFEN 600 MG PO TABS: 600.0000 mg | ORAL_TABLET | Freq: Four times a day (QID) | ORAL | Status: DC | PRN

## 2011-11-23 MED ORDER — DIPHENHYDRAMINE HCL 25 MG PO CAPS
25.0000 mg | ORAL_CAPSULE | Freq: Once | ORAL | Status: AC
  Administered 2011-11-23: 25 mg via ORAL
  Filled 2011-11-23: qty 1

## 2011-11-23 MED ORDER — SODIUM CHLORIDE 0.9 % IJ SOLN
INTRAMUSCULAR | Status: AC
  Filled 2011-11-23: qty 6

## 2011-11-23 MED ORDER — MEDROXYPROGESTERONE ACETATE 10 MG PO TABS
10.0000 mg | ORAL_TABLET | Freq: Every day | ORAL | Status: DC
  Administered 2011-11-23: 10 mg via ORAL
  Filled 2011-11-23 (×2): qty 1

## 2011-11-23 MED ORDER — KETOROLAC TROMETHAMINE 60 MG/2ML IM SOLN
60.0000 mg | Freq: Once | INTRAMUSCULAR | Status: AC
  Administered 2011-11-23: 60 mg via INTRAMUSCULAR
  Filled 2011-11-23: qty 2

## 2011-11-23 MED ORDER — PROMETHAZINE HCL 25 MG/ML IJ SOLN
12.5000 mg | Freq: Four times a day (QID) | INTRAMUSCULAR | Status: DC | PRN
  Administered 2011-11-23: 12.5 mg via INTRAVENOUS
  Filled 2011-11-23: qty 1

## 2011-11-23 MED ORDER — OXYCODONE-ACETAMINOPHEN 5-325 MG PO TABS
2.0000 | ORAL_TABLET | Freq: Once | ORAL | Status: AC
  Administered 2011-11-23: 2 via ORAL
  Filled 2011-11-23: qty 2

## 2011-11-23 MED ORDER — INSULIN ASPART 100 UNIT/ML ~~LOC~~ SOLN: 2.0000 [IU] | Freq: Three times a day (TID) | SUBCUTANEOUS | Status: DC

## 2011-11-23 MED ORDER — PANTOPRAZOLE SODIUM 40 MG PO TBEC: 40.0000 mg | DELAYED_RELEASE_TABLET | Freq: Every day | ORAL | Status: DC

## 2011-11-23 MED ORDER — MAGNESIUM HYDROXIDE 400 MG/5ML PO SUSP: 30.0000 mL | Freq: Every day | ORAL | Status: DC | PRN

## 2011-11-23 NOTE — Discharge Summary (Signed)
Physician Discharge Summary  Patient ID: [name] MRN: [id number] DOB/AGE: 06/27/1965 46 y.o.  Admit date: 11/22/2011 Discharge date: 11/23/2011  Admission Diagnoses:Dysfunctional uterine bleeding, anemia   Discharge Diagnoses: same Active Problems:  * No active hospital problems. *    Discharged Condition: fair  Hospital Course: Improved after transfusion for symptomatic anemia  Consults: None  Significant Diagnostic Studies: labs: Hb 7.2 at admission CBC    Component Value Date/Time   WBC 2.5* 11/23/2011 1455   RBC 2.80* 11/23/2011 1455   HGB 8.3* 11/23/2011 1455   HCT 25.8* 11/23/2011 1455   PLT 78* 11/23/2011 1455   MCV 92.1 11/23/2011 1455   MCH 29.6 11/23/2011 1455   MCHC 32.2 11/23/2011 1455   RDW 17.5* 11/23/2011 1455   LYMPHSABS 1.6 09/17/2011 0933   MONOABS 0.3 09/17/2011 0933   EOSABS 0.1 09/17/2011 0933   BASOSABS 0.0 09/17/2011 0933     and radiology: Ultrasound:   *RADIOLOGY REPORT*  Clinical Data: [place] menorrhagia  TRANSABDOMINAL AND TRANSVAGINAL ULTRASOUND OF PELVIS  Technique: Both transabdominal and transvaginal ultrasound  examinations of the pelvis were performed. Transabdominal technique  was performed for global imaging of the pelvis including uterus,  ovaries, adnexal regions, and pelvic cul-de-sac.  It was necessary to proceed with endovaginal exam following the  transabdominal exam to visualize the myometrium and ovaries.  Comparison: CT abdomen and pelvis 01/20/2011  Findings:  Uterus: The uterus is mildly anteverted and measures about 8.9 x  4.7 x 5 cm. Focal hypoechoic masses are demonstrated in the uterus  consistent with multiple fibroids. A 7 mm diameter myometrial  fibroid is demonstrated in the uterine fundus. A 1.3 x 0.7 x 0.7  cm and 8-0.4 x 3.2 x 3 cm fibroid are demonstrated posteriorly in  the uterus in a subserosal location.  Endometrium: Normal endometrial stripe thickness measured at 4 mm.  There is a small amount of fluid within the  endometrium.  Endometrial polyp is not excluded.  Right ovary: Not visualized. No abnormal adnexal masses.  Left ovary: Not visualized. No abnormal adnexal masses.  Other findings: No free fluid  IMPRESSION:  Multiple uterine fibroids. Ovaries are not visualized. Normal  endometrial stripe thickness with small amount of fluid in the  endometrium. Endometrial polyp is not excluded. .  Original Report Authenticated By: Marlon Pel, M.D.       Treatments: IV hydration, transfusion 2 units PRBC  Discharge Exam: Blood pressure 136/89, pulse 76, temperature 99 F (37.2 C), temperature source Oral, resp. rate 16, height 5' 1.5" (1.562 m), weight 70.308 kg (155 lb), last menstrual period 11/01/2011, SpO2 97.00%. General appearance: alert and no distress GI: abnormal findings:  mild epigastric tenderness, no guarding or rebound  Disposition: 01-Home or Self Care  Discharge Orders    Future Appointments: Provider: Department: Dept Phone: Center:   12/18/2011 9:00 AM Corwin Levins, MD [facility] [phone] [facility]     Medication List  As of 11/23/2011  5:43 PM   TAKE these medications         insulin aspart 100 UNIT/ML injection   Commonly known as: novoLOG   Inject 2 Units into the skin 3 (three) times daily before meals. Patient states that she uses the novolog only when her cbg is greater then 200mg /dl.      medroxyPROGESTERone 10 MG tablet   Commonly known as: PROVERA   Take 0.5 tablets (5 mg total) by mouth daily.      metFORMIN 500 MG tablet  Commonly known as: GLUCOPHAGE   Take 1 tablet (500 mg total) by mouth 2 (two) times daily with a meal.      multivitamin with minerals Tabs   Take 1 tablet by mouth daily.      OVER THE COUNTER MEDICATION   Take 1 tablet by mouth daily as needed. For bloating or puffiness. Pt states that she takes an over the counter diuretic      pantoprazole 40 MG tablet   Commonly known as: PROTONIX   Take 1 tablet (40 mg total) by mouth daily  at 12 noon.           Follow-up Information    Follow up with Almon Hercules., MD.   Contact information:   [address] [place] [address] [phone]          Signed: Scheryl Darter 11/23/2011, 5:43 PM

## 2011-11-23 NOTE — Progress Notes (Signed)
PT D/C HOME WITH FRIEND, WC, TO PRIVATE CAR. D/C INSTRUCTIONS AND PRESCRIPTIONS REVIEWED WITH PT. PT VERBALIZED UNDERSTANDING.

## 2011-11-23 NOTE — Progress Notes (Signed)
Dr. Debroah Loop notified of cbc results. Pt received phenergan for n/v @1420  with good relief. MD will see pt. No new orders at this time. Will continue to monitor.

## 2011-11-23 NOTE — H&P (Signed)
See MAU Note.  Tool, CNM 11/23/2011 8:40 AM

## 2011-11-23 NOTE — MAU Provider Note (Signed)
[name] [id number] 46 y.o.G2P2003. Chief Complaint  Patient presents with  . Vaginal Bleeding     First Provider Initiated Contact with Patient 11/22/11 2339      SUBJECTIVE  HPI: The patient is a 46 year old gravida 2 para 2003 female who presents to maternity admissions reporting heavy vaginal bleeding with large clots x2 weeks, dizziness, fatigue, seeing spots upon standing. Prior to this she did not have any menstrual bleeding x2 months. Prior to that she states she had normal cycles every 28 days with moderate bleeding. She states she has an appointment with Dr. Tenny Craw on August 8, but could not wait until that visit to address these problems because of the heavy bleeding. She denies any other history of heavy bleeding, syncope,.  She has fibroids per ultrasound in 2011.  Patient has type 2 diabetes. Does not seem to recall medication regimen clearly. States she takes metformin 1000 mg twice a day inconsistently and is supposed to take NovoLog 2 units with meals if blood sugar greater than 200.  Past Medical History  Diagnosis Date  . Impaired glucose tolerance 11/07/2010  . Hepatitis C approx dx 2005    no tx to date  . Pancreatitis     HX of  . Alcohol dependency   . GERD (gastroesophageal reflux disease)   . Chronic constipation   . Gallstones   . Drug dependency     Hx of cocaine use  . Depression   . Rheumatoid factor positive   . Hemochromatosis (patient does not think she has this)     last phlebotomy tx 2-3 yrs  . Pancytopenia   . Abnormal vaginal bleeding     uterine fibroid  . Gallstone 11/09/2010  . Chronic abdominal pain 11/09/2010  . Cirrhosis 11/09/2010  . Type II or unspecified type diabetes mellitus without mention of complication, uncontrolled 09/17/2011  . Anxiety 09/17/2011   Past Surgical History  Procedure Date  . Appendectomy   . Cesarean section x 2   History   Social History  . Marital Status: Married    Spouse Name: N/A    Number of Children: 3  .  Years of Education: N/A   Occupational History  . formed [employer]     now staying home   Social History Main Topics  . Smoking status: Former Games developer  . Smokeless tobacco: Not on file  . Alcohol Use: Yes  . Drug Use: No  . Sexually Active: Not Currently    Birth Control/ Protection: None   Other Topics Concern  . Not on file   Social History Narrative  . No narrative on file   No current facility-administered medications on file prior to encounter.   Current Outpatient Prescriptions on File Prior to Encounter  Medication Sig Dispense Refill  . insulin aspart (NOVOLOG) 100 UNIT/ML injection Inject 2 Units into the skin 3 (three) times daily before meals. Patient states that she uses the novolog only when her cbg is greater then 200mg /dl.      Marland Kitchen escitalopram (LEXAPRO) 10 MG tablet Take 1 tablet (10 mg total) by mouth daily.  90 tablet  3  . metFORMIN (GLUCOPHAGE) 500 MG tablet Take 1 tablet (500 mg total) by mouth 2 (two) times daily with a meal.  180 tablet  3  . Multiple Vitamin (MULTIVITAMIN WITH MINERALS) TABS Take 1 tablet by mouth daily.       Allergies  Allergen Reactions  . Iodinated Diagnostic Agents Hives    Pt states allergic  to Betadine.    ROS: Pertinent items in HPI  OBJECTIVE Blood pressure 110/75, pulse 122, temperature 99.2 F (37.3 C), temperature source Oral, height 5' 1.5" (1.562 m), weight 70.308 kg (155 lb), last menstrual period 11/01/2011.  GENERAL: Well-developed, well-nourished female in mild distress. Mucous membranes pale. HEENT: Normocephalic, good dentition HEART: normal rate RESP: normal effort ABDOMEN: Distended, mild diffuse tenderness, decreased bowel sounds x4. EXTREMITIES: Nontender, no edema NEURO: Alert and oriented SPECULUM EXAM: Patient left the unit before able to perform. Scant blood on pad. Patient states she passed several clots while up to the bathroom.  LAB RESULTS  Results for orders placed during the hospital  encounter of 11/22/11 (from the past 24 hour(s))  URINALYSIS, ROUTINE W REFLEX MICROSCOPIC     Status: Abnormal   Collection Time   11/22/11  9:50 PM      Component Value Range   Color, Urine YELLOW  YELLOW   APPearance CLEAR  CLEAR   Specific Gravity, Urine <1.005 (*) 1.005 - 1.030   pH 7.0  5.0 - 8.0   Glucose, UA NEGATIVE  NEGATIVE mg/dL   Hgb urine dipstick LARGE (*) NEGATIVE   Bilirubin Urine NEGATIVE  NEGATIVE   Ketones, ur NEGATIVE  NEGATIVE mg/dL   Protein, ur NEGATIVE  NEGATIVE mg/dL   Urobilinogen, UA 0.2  0.0 - 1.0 mg/dL   Nitrite NEGATIVE  NEGATIVE   Leukocytes, UA NEGATIVE  NEGATIVE  URINE MICROSCOPIC-ADD ON     Status: Abnormal   Collection Time   11/22/11  9:50 PM      Component Value Range   Squamous Epithelial / LPF FEW (*) RARE   WBC, UA 0-2  <3 WBC/hpf   RBC / HPF TOO NUMEROUS TO COUNT  <3 RBC/hpf  POCT PREGNANCY, URINE     Status: Normal   Collection Time   11/22/11  9:58 PM      Component Value Range   Preg Test, Ur NEGATIVE  NEGATIVE  CBC     Status: Abnormal   Collection Time   11/22/11 11:08 PM      Component Value Range   WBC 3.9 (*) 4.0 - 10.5 K/uL   RBC 2.42 (*) 3.87 - 5.11 MIL/uL   Hemoglobin 7.2 (*) 12.0 - 15.0 g/dL   HCT 54.0 (*) 98.1 - 19.1 %   MCV 96.7  78.0 - 100.0 fL   MCH 29.8  26.0 - 34.0 pg   MCHC 30.8  30.0 - 36.0 g/dL   RDW 47.8  29.5 - 62.1 %   Platelets 132 (*) 150 - 400 K/uL  GLUCOSE, CAPILLARY     Status: Abnormal   Collection Time   11/22/11 11:11 PM      Component Value Range   Glucose-Capillary 222 (*) 70 - 99 mg/dL    IMAGING US Transvaginal Non-ob  11/23/2011  *RADIOLOGY REPORT*  Clinical Data: Metro menorrhagia  TRANSABDOMINAL AND TRANSVAGINAL ULTRASOUND OF PELVIS Technique:  Both transabdominal and transvaginal ultrasound examinations of the pelvis were performed. Transabdominal technique was performed for global imaging of the pelvis including uterus, ovaries, adnexal regions, and pelvic cul-de-sac.  It was necessary to  proceed with endovaginal exam following the transabdominal exam to visualize the myometrium and ovaries.  Comparison:  CT abdomen and pelvis 01/20/2011  Findings:  Uterus: The uterus is mildly anteverted and measures about 8.9 x 4.7 x 5 cm.  Focal hypoechoic masses are demonstrated in the uterus consistent with multiple fibroids.  A 7 mm diameter myometrial fibroid  is demonstrated in the uterine fundus.  A 1.3 x 0.7 x 0.7 cm and 8-0.4 x 3.2 x 3 cm fibroid are demonstrated posteriorly in the uterus in a subserosal location.  Endometrium: Normal endometrial stripe thickness measured at 4 mm. There is a small amount of fluid within the endometrium. Endometrial polyp is not excluded.  Right ovary:  Not visualized. No abnormal adnexal masses.  Left ovary: Not visualized.  No abnormal adnexal masses.  Other findings: No free fluid  IMPRESSION: Multiple uterine fibroids.  Ovaries are not visualized.  Normal endometrial stripe thickness with small amount of fluid in the endometrium.  Endometrial polyp is not excluded.  .  Original Report Authenticated By: Marlon Pel, M.D.   US Pelvis Complete  11/23/2011  *RADIOLOGY REPORT*  Clinical Data: Metro menorrhagia  TRANSABDOMINAL AND TRANSVAGINAL ULTRASOUND OF PELVIS Technique:  Both transabdominal and transvaginal ultrasound examinations of the pelvis were performed. Transabdominal technique was performed for global imaging of the pelvis including uterus, ovaries, adnexal regions, and pelvic cul-de-sac.  It was necessary to proceed with endovaginal exam following the transabdominal exam to visualize the myometrium and ovaries.  Comparison:  CT abdomen and pelvis 01/20/2011  Findings:  Uterus: The uterus is mildly anteverted and measures about 8.9 x 4.7 x 5 cm.  Focal hypoechoic masses are demonstrated in the uterus consistent with multiple fibroids.  A 7 mm diameter myometrial fibroid is demonstrated in the uterine fundus.  A 1.3 x 0.7 x 0.7 cm and 8-0.4 x 3.2 x 3 cm  fibroid are demonstrated posteriorly in the uterus in a subserosal location.  Endometrium: Normal endometrial stripe thickness measured at 4 mm. There is a small amount of fluid within the endometrium. Endometrial polyp is not excluded.  Right ovary:  Not visualized. No abnormal adnexal masses.  Left ovary: Not visualized.  No abnormal adnexal masses.  Other findings: No free fluid  IMPRESSION: Multiple uterine fibroids.  Ovaries are not visualized.  Normal endometrial stripe thickness with small amount of fluid in the endometrium.  Endometrial polyp is not excluded.  .  Original Report Authenticated By: Marlon Pel, M.D.   ASSESSMENT 1. Menometrorrhagia   2. Fibroid   3. Anemia   4.  Constipation 5.  Elevated liver enzymes  PLAN Admit to third floor for transfusion Medication List  As of 11/23/2011  8:36 AM   ASK your doctor about these medications         insulin aspart 100 UNIT/ML injection   Commonly known as: novoLOG   Inject 2 Units into the skin 3 (three) times daily before meals. Patient states that she uses the novolog only when her cbg is greater then 200mg /dl.      metFORMIN 409 MG tablet   Commonly known as: GLUCOPHAGE   Take 1 tablet (500 mg total) by mouth 2 (two) times daily with a meal.      multivitamin with minerals Tabs   Take 1 tablet by mouth daily.      OVER THE COUNTER MEDICATION   Take 1 tablet by mouth daily as needed. For bloating or puffiness. Pt states that she takes an over the counter diuretic           Katrinka Blazing, Meoshia Billing 11/23/2011 1:14 AM

## 2011-11-24 LAB — TYPE AND SCREEN
ABO/RH(D): A POS
Antibody Screen: NEGATIVE
Unit division: 0

## 2011-11-25 NOTE — H&P (Signed)
Agree with above note.  [name] 11/25/2011 10:12 AM

## 2011-11-25 NOTE — MAU Provider Note (Signed)
Agree with above note.  Karryn Kosinski 11/25/2011 10:13 AM

## 2011-11-27 NOTE — Progress Notes (Signed)
Post discharge review completed. 

## 2011-11-28 ENCOUNTER — Other Ambulatory Visit: Payer: Self-pay | Admitting: Obstetrics and Gynecology

## 2011-12-09 ENCOUNTER — Encounter (HOSPITAL_COMMUNITY): Payer: Self-pay

## 2011-12-09 ENCOUNTER — Encounter (HOSPITAL_COMMUNITY)
Admission: RE | Admit: 2011-12-09 | Discharge: 2011-12-09 | Disposition: A | Discharge status: Home / Self Care | Payer: BC Managed Care – PPO | Source: Ambulatory Visit | Attending: Obstetrics and Gynecology | Admitting: Obstetrics and Gynecology

## 2011-12-09 LAB — BASIC METABOLIC PANEL
BUN: 9 mg/dL (ref 6–23)
GFR calc Af Amer: >90 mL/min (ref >90)
GFR calc non Af Amer: >90 mL/min (ref >90)
Potassium: 2.8 mEq/L — ABNORMAL LOW (ref 3.5–5.1)
Sodium: 137 mEq/L (ref 135–145)

## 2011-12-09 LAB — CBC
MCHC: 31.1 g/dL (ref 30.0–36.0)
Platelets: 169 10*3/uL (ref 150–400)
RDW: 17.1 % — ABNORMAL HIGH (ref 11.5–15.5)

## 2011-12-09 LAB — SURGICAL PCR SCREEN
MRSA, PCR: NEGATIVE
Staphylococcus aureus: NEGATIVE

## 2011-12-09 NOTE — Patient Instructions (Addendum)
20 [name]  12/09/2011   Your procedure is scheduled on:  12/17/11  Enter through the Main Entrance of [facility] at 6 AM.  Pick up the phone at the desk and dial [phone].   Call this number if you have problems the morning of surgery: [phone]   Remember:   Do not eat food:After Midnight.  Do not drink clear liquids: After Midnight.  Take these medicines the morning of surgery with A SIP OF WATER: Protonix, hold Metformin for 24hrs prior to surgery   Do not wear jewelry, make-up or nail polish.  Do not wear lotions, powders, or perfumes. You may wear deodorant.  Do not shave 48 hours prior to surgery.  Do not bring valuables to the hospital.  Contacts, dentures or bridgework may not be worn into surgery.  Leave suitcase in the car. After surgery it may be brought to your room.  For patients admitted to the hospital, checkout time is 11:00 AM the day of discharge.   Patients discharged the day of surgery will not be allowed to drive home.  Name and phone number of your driver: NA  Special Instructions: CHG Shower Use Special Wash: 1/2 bottle night before surgery and 1/2 bottle morning of surgery.   Please read over the following fact sheets that you were given: MRSA Information

## 2011-12-10 NOTE — Pre-Procedure Instructions (Signed)
Dr. Miguel Aschoff office notified of Potassium 2.8 and glucose of 237, results given to [facility], no orders at this time.

## 2011-12-10 NOTE — Pre-Procedure Instructions (Signed)
K* of 2.8 and glucose of 237 given to Dr, Cristela Blue, no orders given. He is aware Dr Tenny Craw has been notified.

## 2011-12-16 ENCOUNTER — Encounter (HOSPITAL_COMMUNITY): Payer: Self-pay | Admitting: Anesthesiology

## 2011-12-16 NOTE — Anesthesia Preprocedure Evaluation (Addendum)
Anesthesia Evaluation  Patient identified by MRN, date of birth, ID band  Reviewed: Allergy & Precautions, H&P , NPO status , Patient's Chart, lab work & pertinent test results  Airway Mallampati: III TM Distance: >3 FB Neck ROM: Full    Dental No notable dental hx. (+) Teeth Intact   Pulmonary former smoker,  breath sounds clear to auscultation  Pulmonary exam normal       Cardiovascular negative cardio ROS  Rhythm:Regular Rate:Normal     Neuro/Psych Anxiety Depression Bil. CTS  Neuromuscular disease    GI/Hepatic GERD-  Medicated and Controlled,(+)     substance abuse  alcohol use, Hepatitis -, CHemochromatosis Chronic Pancreatitis Chronic Abdominal pain   Endo/Other  Poorly Controlled, Type 2, Oral Hypoglycemic Agents and Insulin Dependent  Renal/GU   negative genitourinary   Musculoskeletal   Abdominal   Peds  Hematology Hx/o Pancytopenia   Anesthesia Other Findings   Reproductive/Obstetrics Fibroids Menorrhaggia                          Anesthesia Physical Anesthesia Plan  ASA: III  Anesthesia Plan: General   Post-op Pain Management:    Induction: Intravenous  Airway Management Planned: Oral ETT  Additional Equipment:   Intra-op Plan:   Post-operative Plan: Extubation in OR  Informed Consent: I have reviewed the patients History and Physical, chart, labs and discussed the procedure including the risks, benefits and alternatives for the proposed anesthesia with the patient or authorized representative who has indicated his/her understanding and acceptance.   Dental advisory given  Plan Discussed with: CRNA, Anesthesiologist and Surgeon  Anesthesia Plan Comments:         Anesthesia Quick Evaluation

## 2011-12-16 NOTE — H&P (Signed)
[name] is an 46 y.o. female with a history of prolonged vaginal bleeding for which she has received a transfusion. Evaluation revealed the patient to have multiple fibroids and now patient desires definitive therapy to deal with the heavy bleeding and fibroids.  Pertinent Gynecological History: Menses: flow is excessive with use of 8 pads or tampons on heaviest days Bleeding: dysfunctional uterine bleeding Contraception: tubal ligation DES exposure: denies Blood transfusions: has received transfusion due to heavy bleeding Sexually transmitted diseases: no past history Last pap: normal Date: 11/28/2011  OB History: G2 P3003   Menstrual History:  Patient's last menstrual period was 11/01/2011.    Past Medical History  Diagnosis Date  . Impaired glucose tolerance 11/07/2010  . Hepatitis C approx dx 2005    no tx to date  . Pancreatitis     HX of  . Alcohol dependency   . GERD (gastroesophageal reflux disease)   . Chronic constipation   . Gallstones   . Drug dependency     Hx of cocaine use  . Depression   . Rheumatoid factor positive   . Hemochromatosis     last phlebotomy tx 2-3 yrs  . Pancytopenia   . Abnormal vaginal bleeding     uterine fibroid  . Gallstone 11/09/2010  . Chronic abdominal pain 11/09/2010  . Cirrhosis 11/09/2010  . Type II or unspecified type diabetes mellitus without mention of complication, uncontrolled 09/17/2011  . Anxiety 09/17/2011    Past Surgical History  Procedure Date  . Appendectomy   . Cesarean section x 2  . Foot surgery 2013    Family History  Problem Relation Age of Onset  . Cancer Mother     ovarian  . Diabetes Father     Social History:  reports that she has quit smoking. She does not have any smokeless tobacco history on file. She reports that she drinks alcohol. She reports that she does not use illicit drugs.  Allergies:  Allergies  Allergen Reactions  . Iodinated Diagnostic Agents Hives    Pt states allergic to  Betadine.    No prescriptions prior to admission    ROS  Respiratory review no SOB or cough GI: positive for constipation no nausea, vomiting or diarrhea GU: no dysuria, frequency or urgency Gyn: See HPI   Height 5\' 2"  (1.575 m), weight 153 lb (69.4 kg), last menstrual period 11/01/2011. Physical Exam  BP 118/60   Pulse 84  Respirations 16  Afebrile Head: Normocephalic and atraumatic Neck: Supple no JVD no increased thyroid Chest: Clear to P&A Heart: regular rhythm no murmur or gallop Abdomen: Soft non tender, no increase in liver, kidneys or spleen Back: no CVA tenderness Pelvic: Externa genitalia within normal limits              BUS: within normal limits             Vagina: without lesion normal discharge              Cervix without lesion non tender              Uterus slightly enlarged and irregular in shape              Adnexa: no masses noted and no tender  Neurologic exam grossly intact   CT and ultrasound confirm the presence of the fibroids No results found for this or any previous visit (from the past 24 hour(s)).  No results found.  Assessment/Plan:  Menometrorrhagia secondary to uterine  fibroids.  Hypokalemia Diabetes  Plan: Will proceed with TAH BSO as definitive treatment. The risks and benefits have been discussed and the patient understands and has given her informed consent.  Miguel Aschoff MD                                          12/16/2011, 5:43 PM

## 2011-12-17 ENCOUNTER — Inpatient Hospital Stay (HOSPITAL_COMMUNITY)
Admission: RE | Admit: 2011-12-17 | Discharge: 2011-12-20 | DRG: 358 | Disposition: A | Discharge status: Home / Self Care | Payer: BC Managed Care – PPO | Source: Ambulatory Visit | Attending: Obstetrics and Gynecology | Admitting: Obstetrics and Gynecology

## 2011-12-17 ENCOUNTER — Inpatient Hospital Stay (HOSPITAL_COMMUNITY): Payer: BC Managed Care – PPO | Admitting: Anesthesiology

## 2011-12-17 ENCOUNTER — Encounter (HOSPITAL_COMMUNITY): Payer: Self-pay | Admitting: Anesthesiology

## 2011-12-17 ENCOUNTER — Encounter (HOSPITAL_COMMUNITY)
Admission: RE | Disposition: A | Discharge status: Home / Self Care | Payer: Self-pay | Source: Ambulatory Visit | Attending: Obstetrics and Gynecology

## 2011-12-17 ENCOUNTER — Other Ambulatory Visit (HOSPITAL_COMMUNITY): Payer: Self-pay | Admitting: Obstetrics and Gynecology

## 2011-12-17 ENCOUNTER — Encounter (HOSPITAL_COMMUNITY): Payer: Self-pay | Admitting: *Deleted

## 2011-12-17 DIAGNOSIS — D251 Intramural leiomyoma of uterus: Secondary | ICD-10-CM | POA: Diagnosis present

## 2011-12-17 DIAGNOSIS — K5900 Constipation, unspecified: Secondary | ICD-10-CM | POA: Diagnosis present

## 2011-12-17 DIAGNOSIS — E119 Type 2 diabetes mellitus without complications: Secondary | ICD-10-CM | POA: Diagnosis present

## 2011-12-17 DIAGNOSIS — E876 Hypokalemia: Secondary | ICD-10-CM | POA: Diagnosis present

## 2011-12-17 DIAGNOSIS — N83209 Unspecified ovarian cyst, unspecified side: Secondary | ICD-10-CM | POA: Diagnosis present

## 2011-12-17 DIAGNOSIS — N920 Excessive and frequent menstruation with regular cycle: Principal | ICD-10-CM | POA: Diagnosis present

## 2011-12-17 HISTORY — PX: SALPINGOOPHORECTOMY: SHX82

## 2011-12-17 HISTORY — PX: ABDOMINAL HYSTERECTOMY: SHX81

## 2011-12-17 LAB — CBC WITH DIFFERENTIAL/PLATELET
Basophils Absolute: 0 10*3/uL (ref 0.0–0.1)
Basophils Relative: 0 % (ref 0–1)
Eosinophils Absolute: 0 10*3/uL (ref 0.0–0.7)
MCH: 29.4 pg (ref 26.0–34.0)
MCHC: 31.2 g/dL (ref 30.0–36.0)
Neutrophils Relative %: 95 % — ABNORMAL HIGH (ref 43–77)
Platelets: 85 10*3/uL — ABNORMAL LOW (ref 150–400)
RDW: 17.4 % — ABNORMAL HIGH (ref 11.5–15.5)

## 2011-12-17 LAB — COMPREHENSIVE METABOLIC PANEL
ALT: 78 U/L — ABNORMAL HIGH (ref 0–35)
AST: 131 U/L — ABNORMAL HIGH (ref 0–37)
Albumin: 3.2 g/dL — ABNORMAL LOW (ref 3.5–5.2)
Alkaline Phosphatase: 59 U/L (ref 39–117)
Glucose, Bld: 118 mg/dL — ABNORMAL HIGH (ref 70–99)
Potassium: 3.5 mEq/L (ref 3.5–5.1)
Sodium: 142 mEq/L (ref 135–145)
Total Protein: 7.1 g/dL (ref 6.0–8.3)

## 2011-12-17 LAB — CBC
Platelets: 65 10*3/uL — ABNORMAL LOW (ref 150–400)
RDW: 17.7 % — ABNORMAL HIGH (ref 11.5–15.5)
WBC: 2.3 10*3/uL — ABNORMAL LOW (ref 4.0–10.5)

## 2011-12-17 LAB — PROTIME-INR
INR: 1.09 (ref 0.00–1.49)
Prothrombin Time: 14.3 seconds (ref 11.6–15.2)

## 2011-12-17 LAB — GLUCOSE, CAPILLARY
Glucose-Capillary: 130 mg/dL — ABNORMAL HIGH (ref 70–99)
Glucose-Capillary: 138 mg/dL — ABNORMAL HIGH (ref 70–99)

## 2011-12-17 SURGERY — HYSTERECTOMY, ABDOMINAL
Anesthesia: General | Site: Abdomen | Wound class: Clean Contaminated

## 2011-12-17 MED ORDER — OXYCODONE-ACETAMINOPHEN 5-325 MG PO TABS
1.0000 | ORAL_TABLET | ORAL | Status: DC | PRN
  Administered 2011-12-18: 1 via ORAL
  Administered 2011-12-18: 2 via ORAL
  Administered 2011-12-18: 1 via ORAL
  Administered 2011-12-18 – 2011-12-20 (×7): 2 via ORAL
  Administered 2011-12-20: 1 via ORAL
  Filled 2011-12-17: qty 2
  Filled 2011-12-17: qty 1
  Filled 2011-12-17 (×4): qty 2
  Filled 2011-12-17: qty 1
  Filled 2011-12-17: qty 2
  Filled 2011-12-17: qty 1
  Filled 2011-12-17 (×2): qty 2

## 2011-12-17 MED ORDER — FENTANYL CITRATE 0.05 MG/ML IJ SOLN
INTRAMUSCULAR | Status: DC | PRN
  Administered 2011-12-17 (×5): 50 ug via INTRAVENOUS
  Administered 2011-12-17: 100 ug via INTRAVENOUS
  Administered 2011-12-17: 50 ug via INTRAVENOUS
  Administered 2011-12-17: 100 ug via INTRAVENOUS

## 2011-12-17 MED ORDER — DIPHENHYDRAMINE HCL 12.5 MG/5ML PO ELIX
12.5000 mg | ORAL_SOLUTION | Freq: Four times a day (QID) | ORAL | Status: DC | PRN
  Administered 2011-12-18: 12.5 mg via ORAL
  Filled 2011-12-17: qty 5

## 2011-12-17 MED ORDER — LACTATED RINGERS IV SOLN: INTRAVENOUS | Status: DC

## 2011-12-17 MED ORDER — HYDROMORPHONE HCL PF 1 MG/ML IJ SOLN
INTRAMUSCULAR | Status: AC
  Administered 2011-12-17: 0.5 mg via INTRAVENOUS
  Filled 2011-12-17: qty 1

## 2011-12-17 MED ORDER — LIDOCAINE HCL (CARDIAC) 20 MG/ML IV SOLN
INTRAVENOUS | Status: AC
  Filled 2011-12-17: qty 5

## 2011-12-17 MED ORDER — INSULIN REGULAR BOLUS VIA INFUSION: 0.0000 [IU] | Freq: Three times a day (TID) | INTRAVENOUS | Status: DC

## 2011-12-17 MED ORDER — PROPOFOL 10 MG/ML IV EMUL
INTRAVENOUS | Status: DC | PRN
  Administered 2011-12-17: 170 mg via INTRAVENOUS

## 2011-12-17 MED ORDER — ONDANSETRON HCL 4 MG/2ML IJ SOLN
4.0000 mg | Freq: Four times a day (QID) | INTRAMUSCULAR | Status: DC | PRN
  Administered 2011-12-17 – 2011-12-18 (×2): 4 mg via INTRAVENOUS
  Filled 2011-12-17 (×2): qty 2

## 2011-12-17 MED ORDER — HYDROMORPHONE HCL PF 1 MG/ML IJ SOLN
INTRAMUSCULAR | Status: AC
  Filled 2011-12-17: qty 1

## 2011-12-17 MED ORDER — SODIUM CHLORIDE 0.9 % IJ SOLN: 9.0000 mL | INTRAMUSCULAR | Status: DC | PRN

## 2011-12-17 MED ORDER — GLYCOPYRROLATE 0.2 MG/ML IJ SOLN
INTRAMUSCULAR | Status: DC | PRN
  Administered 2011-12-17: .5 mg via INTRAVENOUS

## 2011-12-17 MED ORDER — GLYCOPYRROLATE 0.2 MG/ML IJ SOLN
INTRAMUSCULAR | Status: AC
  Filled 2011-12-17: qty 1

## 2011-12-17 MED ORDER — CEFAZOLIN SODIUM-DEXTROSE 2-3 GM-% IV SOLR
2.0000 g | Freq: Three times a day (TID) | INTRAVENOUS | Status: AC
  Administered 2011-12-17 (×2): 2 g via INTRAVENOUS
  Filled 2011-12-17 (×2): qty 50

## 2011-12-17 MED ORDER — ONDANSETRON HCL 4 MG/2ML IJ SOLN
INTRAMUSCULAR | Status: DC | PRN
  Administered 2011-12-17: 4 mg via INTRAVENOUS

## 2011-12-17 MED ORDER — CEFAZOLIN SODIUM-DEXTROSE 2-3 GM-% IV SOLR: 2.0000 g | Freq: Three times a day (TID) | INTRAVENOUS | Status: DC

## 2011-12-17 MED ORDER — NEOSTIGMINE METHYLSULFATE 1 MG/ML IJ SOLN
INTRAMUSCULAR | Status: DC | PRN
  Administered 2011-12-17: 2.5 mg via INTRAVENOUS

## 2011-12-17 MED ORDER — DEXTROSE 50 % IV SOLN: 25.0000 mL | INTRAVENOUS | Status: DC | PRN

## 2011-12-17 MED ORDER — IBUPROFEN 600 MG PO TABS: 600.0000 mg | ORAL_TABLET | Freq: Four times a day (QID) | ORAL | Status: DC | PRN

## 2011-12-17 MED ORDER — MIDAZOLAM HCL 5 MG/5ML IJ SOLN
INTRAMUSCULAR | Status: DC | PRN
  Administered 2011-12-17: 2 mg via INTRAVENOUS

## 2011-12-17 MED ORDER — NEOSTIGMINE METHYLSULFATE 1 MG/ML IJ SOLN
INTRAMUSCULAR | Status: AC
  Filled 2011-12-17: qty 10

## 2011-12-17 MED ORDER — HYDROMORPHONE HCL PF 1 MG/ML IJ SOLN
0.2500 mg | INTRAMUSCULAR | Status: DC | PRN
  Administered 2011-12-17 (×5): 0.5 mg via INTRAVENOUS

## 2011-12-17 MED ORDER — CEFAZOLIN SODIUM-DEXTROSE 2-3 GM-% IV SOLR
2.0000 g | Freq: Once | INTRAVENOUS | Status: AC
  Administered 2011-12-17: 2 g via INTRAVENOUS

## 2011-12-17 MED ORDER — MEPERIDINE HCL 25 MG/ML IJ SOLN: 6.2500 mg | INTRAMUSCULAR | Status: DC | PRN

## 2011-12-17 MED ORDER — METOCLOPRAMIDE HCL 5 MG/ML IJ SOLN: 10.0000 mg | Freq: Once | INTRAMUSCULAR | Status: AC | PRN

## 2011-12-17 MED ORDER — SODIUM CHLORIDE 0.9 % IV SOLN: INTRAVENOUS | Status: DC

## 2011-12-17 MED ORDER — HYDROMORPHONE HCL PF 1 MG/ML IJ SOLN
0.2500 mg | INTRAMUSCULAR | Status: DC | PRN
  Administered 2011-12-17 (×7): 0.5 mg via INTRAVENOUS

## 2011-12-17 MED ORDER — ROCURONIUM BROMIDE 100 MG/10ML IV SOLN
INTRAVENOUS | Status: DC | PRN
  Administered 2011-12-17: 40 mg via INTRAVENOUS

## 2011-12-17 MED ORDER — HYDROMORPHONE 0.3 MG/ML IV SOLN
INTRAVENOUS | Status: DC
  Administered 2011-12-17 (×2): 0.9 mg via INTRAVENOUS
  Administered 2011-12-17: 11:00:00 via INTRAVENOUS
  Administered 2011-12-17: 0.9 mg via INTRAVENOUS
  Administered 2011-12-18: 0.3 mg via INTRAVENOUS
  Administered 2011-12-18: 0.6 mg via INTRAVENOUS
  Filled 2011-12-17: qty 25

## 2011-12-17 MED ORDER — INSULIN ASPART 100 UNIT/ML ~~LOC~~ SOLN
0.0000 [IU] | SUBCUTANEOUS | Status: DC
  Administered 2011-12-17: 3 [IU] via SUBCUTANEOUS
  Administered 2011-12-17: 2 [IU] via SUBCUTANEOUS
  Administered 2011-12-17: 3 [IU] via SUBCUTANEOUS
  Administered 2011-12-18 (×2): 2 [IU] via SUBCUTANEOUS

## 2011-12-17 MED ORDER — MENTHOL 3 MG MT LOZG: 1.0000 | LOZENGE | OROMUCOSAL | Status: DC | PRN

## 2011-12-17 MED ORDER — ROCURONIUM BROMIDE 50 MG/5ML IV SOLN
INTRAVENOUS | Status: AC
  Filled 2011-12-17: qty 1

## 2011-12-17 MED ORDER — NALOXONE HCL 0.4 MG/ML IJ SOLN: 0.4000 mg | INTRAMUSCULAR | Status: DC | PRN

## 2011-12-17 MED ORDER — CEFAZOLIN SODIUM-DEXTROSE 2-3 GM-% IV SOLR
INTRAVENOUS | Status: AC
  Administered 2011-12-17: 2 g via INTRAVENOUS
  Filled 2011-12-17: qty 50

## 2011-12-17 MED ORDER — PROPOFOL 10 MG/ML IV EMUL
INTRAVENOUS | Status: AC
  Filled 2011-12-17: qty 20

## 2011-12-17 MED ORDER — 0.9 % SODIUM CHLORIDE (POUR BTL) OPTIME
TOPICAL | Status: DC | PRN
  Administered 2011-12-17: 1000 mL

## 2011-12-17 MED ORDER — DIPHENHYDRAMINE HCL 50 MG/ML IJ SOLN: 12.5000 mg | Freq: Four times a day (QID) | INTRAMUSCULAR | Status: DC | PRN

## 2011-12-17 MED ORDER — FENTANYL CITRATE 0.05 MG/ML IJ SOLN
INTRAMUSCULAR | Status: AC
  Filled 2011-12-17: qty 5

## 2011-12-17 MED ORDER — SODIUM CHLORIDE 0.9 % IV SOLN
INTRAVENOUS | Status: DC
  Administered 2011-12-17 – 2011-12-18 (×2): via INTRAVENOUS

## 2011-12-17 MED ORDER — DEXAMETHASONE SODIUM PHOSPHATE 4 MG/ML IJ SOLN
INTRAMUSCULAR | Status: DC | PRN
  Administered 2011-12-17: 10 mg via INTRAVENOUS

## 2011-12-17 MED ORDER — LACTATED RINGERS IV SOLN
INTRAVENOUS | Status: DC
  Administered 2011-12-17 (×3): via INTRAVENOUS

## 2011-12-17 MED ORDER — DEXTROSE-NACL 5-0.45 % IV SOLN: INTRAVENOUS | Status: DC

## 2011-12-17 MED ORDER — LIDOCAINE HCL (CARDIAC) 20 MG/ML IV SOLN
INTRAVENOUS | Status: DC | PRN
  Administered 2011-12-17: 80 mg via INTRAVENOUS

## 2011-12-17 MED ORDER — ONDANSETRON HCL 4 MG/2ML IJ SOLN
INTRAMUSCULAR | Status: AC
  Filled 2011-12-17: qty 2

## 2011-12-17 SURGICAL SUPPLY — 28 items
CANISTER SUCTION 2500CC (MISCELLANEOUS) ×3 IMPLANT
CLOTH BEACON ORANGE TIMEOUT ST (SAFETY) ×3 IMPLANT
CONT PATH 16OZ SNAP LID 3702 (MISCELLANEOUS) ×3 IMPLANT
DECANTER SPIKE VIAL GLASS SM (MISCELLANEOUS) IMPLANT
DRESSING TELFA 8X3 (GAUZE/BANDAGES/DRESSINGS) ×3 IMPLANT
DRSG COVADERM 4X10 (GAUZE/BANDAGES/DRESSINGS) ×3 IMPLANT
GAUZE SPONGE 4X4 12PLY STRL LF (GAUZE/BANDAGES/DRESSINGS) ×3 IMPLANT
GLOVE BIO SURGEON STRL SZ7.5 (GLOVE) ×6 IMPLANT
GOWN PREVENTION PLUS LG XLONG (DISPOSABLE) ×6 IMPLANT
GOWN PREVENTION PLUS XXLARGE (GOWN DISPOSABLE) ×3 IMPLANT
NEEDLE HYPO 25X1 1.5 SAFETY (NEEDLE) IMPLANT
NS IRRIG 1000ML POUR BTL (IV SOLUTION) ×3 IMPLANT
PACK ABDOMINAL GYN (CUSTOM PROCEDURE TRAY) ×3 IMPLANT
PAD OB MATERNITY 4.3X12.25 (PERSONAL CARE ITEMS) ×3 IMPLANT
PROTECTOR NERVE ULNAR (MISCELLANEOUS) ×3 IMPLANT
SPONGE LAP 18X18 X RAY DECT (DISPOSABLE) IMPLANT
STAPLER VISISTAT 35W (STAPLE) ×3 IMPLANT
SUT VIC AB 0 CT1 18XCR BRD8 (SUTURE) ×6 IMPLANT
SUT VIC AB 0 CT1 27 (SUTURE) ×4
SUT VIC AB 0 CT1 27XBRD ANBCTR (SUTURE) ×8 IMPLANT
SUT VIC AB 0 CT1 8-18 (SUTURE) ×3
SUT VIC AB 2-0 SH 27 (SUTURE)
SUT VIC AB 2-0 SH 27XBRD (SUTURE) IMPLANT
SUT VICRYL 0 TIES 12 18 (SUTURE) ×3 IMPLANT
SYR CONTROL 10ML LL (SYRINGE) IMPLANT
TOWEL OR 17X24 6PK STRL BLUE (TOWEL DISPOSABLE) ×6 IMPLANT
TRAY FOLEY CATH 14FR (SET/KITS/TRAYS/PACK) ×3 IMPLANT
WATER STERILE IRR 1000ML POUR (IV SOLUTION) ×3 IMPLANT

## 2011-12-17 NOTE — H&P (Signed)
Status is unchanged will proceed with planned TAH BSO.

## 2011-12-17 NOTE — Anesthesia Procedure Notes (Signed)
Procedure Name: Intubation Date/Time: 12/17/2011 7:42 AM Performed by: Maris Berger T Pre-anesthesia Checklist: Patient identified, Emergency Drugs available, Suction available and Patient being monitored Patient Re-evaluated:Patient Re-evaluated prior to inductionOxygen Delivery Method: Circle System Utilized Preoxygenation: Pre-oxygenation with 100% oxygen Intubation Type: IV induction Ventilation: Mask ventilation without difficulty Laryngoscope Size: Mac and 3 Grade View: Grade I Tube type: Oral Tube size: 7.5 mm Number of attempts: 1 Airway Equipment and Method: stylet and oral airway Placement Confirmation: ETT inserted through vocal cords under direct vision,  positive ETCO2 and breath sounds checked- equal and bilateral Secured at: 21 cm Tube secured with: Tape Dental Injury: Teeth and Oropharynx as per pre-operative assessment

## 2011-12-17 NOTE — Progress Notes (Signed)
Dr Rodman Pickle at bedside to discuss pain management.  Patient looks miserable and is crying stating her pain level remains at a 7 despite Dilaudid 2 mg IV given over last 20 minutes.  Orders received to give up to an additional 4mg  Dilaudid IV if needed.

## 2011-12-17 NOTE — Addendum Note (Signed)
Addendum  created 12/17/11 1313 by Jayleigh Notarianni C Ernie Sagrero, CRNA   Modules edited:Charges VN    

## 2011-12-17 NOTE — Addendum Note (Signed)
Addendum  created 12/17/11 1650 by Graciela Husbands, CRNA   Modules edited:Notes Section

## 2011-12-17 NOTE — Addendum Note (Signed)
Addendum  created 12/17/11 1313 by Suella Grove, CRNA   Modules edited:Charges VN

## 2011-12-17 NOTE — Progress Notes (Signed)
Pt stated she took potassium pills for 5days. Unsure strength.

## 2011-12-17 NOTE — Progress Notes (Signed)
UR Chart review completed.  

## 2011-12-17 NOTE — Anesthesia Postprocedure Evaluation (Signed)
  Anesthesia Post-op Note  Patient: [name]  Procedure(s) Performed: Procedure(s) (LRB): HYSTERECTOMY ABDOMINAL (N/A) SALPINGO OOPHERECTOMY (Bilateral)  Patient Location: Women's Unit  Anesthesia Type: General  Level of Consciousness: awake, alert  and oriented  Airway and Oxygen Therapy: Patient Spontanous Breathing and Patient connected to nasal cannula oxygen  Post-op Pain: mild  Post-op Assessment: Post-op Vital signs reviewed and Patient's Cardiovascular Status Stable  Post-op Vital Signs: Reviewed and stable  Complications: No apparent anesthesia complications

## 2011-12-17 NOTE — Anesthesia Postprocedure Evaluation (Signed)
Anesthesia Post Note  Patient: [name]  Procedure(s) Performed: Procedure(s) (LRB): HYSTERECTOMY ABDOMINAL (N/A) SALPINGO OOPHERECTOMY (Bilateral)  Anesthesia type: General  Patient location: PACU  Post pain: Pain level controlled  Post assessment: Post-op Vital signs reviewed  Last Vitals:  Filed Vitals:   12/17/11 1045  BP: 143/84  Pulse: 122  Temp:   Resp: 13    Post vital signs: Reviewed  Level of consciousness: sedated  Complications: No apparent anesthesia complications

## 2011-12-17 NOTE — Transfer of Care (Signed)
Immediate Anesthesia Transfer of Care Note  Patient: [name]  Procedure(s) Performed: Procedure(s) (LRB): HYSTERECTOMY ABDOMINAL (N/A) SALPINGO OOPHERECTOMY (Bilateral)  Patient Location: PACU  Anesthesia Type: General  Level of Consciousness: awake, alert  and oriented  Airway & Oxygen Therapy: Patient Spontanous Breathing and Patient connected to nasal cannula oxygen  Post-op Assessment: Report given to PACU RN  Post vital signs: Reviewed and stable  Complications: No apparent anesthesia complications

## 2011-12-17 NOTE — Brief Op Note (Signed)
12/17/2011  9:06 AM  PATIENT:  [name]  46 y.o. female  PRE-OPERATIVE DIAGNOSIS:  FIBROIDS MENORRHAGIA  POST-OPERATIVE DIAGNOSIS:  FIBROIDS MENORRHAGIA  PROCEDURE:  Procedure(s) (LRB): HYSTERECTOMY ABDOMINAL (N/A) SALPINGO OOPHERECTOMY (Bilateral)  SURGEON:  Surgeon(s) and Role:    * Miguel Aschoff, MD - Primary    * Lodema Hong, MD - Assisting  ANESTHESIA:   general  EBL:  Total I/O In: 1000 [I.V.:1000] Out: 200 [Urine:50; Blood:150]  BLOOD ADMINISTERED:none  DRAINS: Urinary Catheter (Foley)   LOCAL MEDICATIONS USED:  NONE  SPECIMEN:  Source of Specimen:  Uterus tubes and ovaries  DISPOSITION OF SPECIMEN:  PATHOLOGY  COUNTS:  YES  TOURNIQUET:  * No tourniquets in log *  DICTATION: .Other Dictation: Dictation Number [id number]  PLAN OF CARE: Admit to inpatient   PATIENT DISPOSITION:  PACU - hemodynamically stable.   Delay start of Pharmacological VTE agent (>24hrs) due to surgical blood loss or risk of bleeding: yes

## 2011-12-17 NOTE — Progress Notes (Signed)
Patient states she is feeling a little relief after last dose of pain meds.  Pain level-6.  Looks more comfortable.  Is able to rest and is no longer crying or grimacing.

## 2011-12-18 ENCOUNTER — Ambulatory Visit: Payer: BC Managed Care – PPO | Admitting: Internal Medicine

## 2011-12-18 ENCOUNTER — Encounter (HOSPITAL_COMMUNITY): Payer: Self-pay | Admitting: Obstetrics and Gynecology

## 2011-12-18 LAB — GLUCOSE, CAPILLARY
Glucose-Capillary: 140 mg/dL — ABNORMAL HIGH (ref 70–99)
Glucose-Capillary: 100 mg/dL — ABNORMAL HIGH (ref 70–99)

## 2011-12-18 LAB — CBC
HCT: 29.6 % — ABNORMAL LOW (ref 36.0–46.0)
Hemoglobin: 9 g/dL — ABNORMAL LOW (ref 12.0–15.0)
MCH: 28.6 pg (ref 26.0–34.0)
MCV: 94 fL (ref 78.0–100.0)
RBC: 3.15 MIL/uL — ABNORMAL LOW (ref 3.87–5.11)
WBC: 3.4 10*3/uL — ABNORMAL LOW (ref 4.0–10.5)

## 2011-12-18 LAB — BASIC METABOLIC PANEL
BUN: 7 mg/dL (ref 6–23)
CO2: 29 mEq/L (ref 19–32)
Calcium: 8.7 mg/dL (ref 8.4–10.5)
Chloride: 102 mEq/L (ref 96–112)
Creatinine, Ser: 0.48 mg/dL — ABNORMAL LOW (ref 0.50–1.10)
Glucose, Bld: 127 mg/dL — ABNORMAL HIGH (ref 70–99)

## 2011-12-18 MED ORDER — INSULIN ASPART 100 UNIT/ML ~~LOC~~ SOLN: 0.0000 [IU] | Freq: Every day | SUBCUTANEOUS | Status: DC

## 2011-12-18 MED ORDER — KETOROLAC TROMETHAMINE 30 MG/ML IJ SOLN
30.0000 mg | Freq: Four times a day (QID) | INTRAMUSCULAR | Status: DC | PRN
  Administered 2011-12-18 (×2): 30 mg via INTRAVENOUS
  Filled 2011-12-18 (×2): qty 1

## 2011-12-18 MED ORDER — MAGNESIUM HYDROXIDE 400 MG/5ML PO SUSP
30.0000 mL | Freq: Every day | ORAL | Status: DC
  Administered 2011-12-18 – 2011-12-19 (×2): 30 mL via ORAL
  Filled 2011-12-18 (×3): qty 30

## 2011-12-18 MED ORDER — INSULIN ASPART 100 UNIT/ML ~~LOC~~ SOLN
0.0000 [IU] | Freq: Three times a day (TID) | SUBCUTANEOUS | Status: DC
  Administered 2011-12-18: 5 [IU] via SUBCUTANEOUS
  Administered 2011-12-19 – 2011-12-20 (×2): 2 [IU] via SUBCUTANEOUS

## 2011-12-18 MED ORDER — BUTORPHANOL TARTRATE 1 MG/ML IJ SOLN: 1.0000 mg | INTRAMUSCULAR | Status: DC | PRN

## 2011-12-18 MED ORDER — BISACODYL 5 MG PO TBEC
5.0000 mg | DELAYED_RELEASE_TABLET | Freq: Every day | ORAL | Status: DC | PRN
  Administered 2011-12-18 – 2011-12-19 (×2): 5 mg via ORAL
  Filled 2011-12-18 (×2): qty 1

## 2011-12-18 NOTE — Progress Notes (Signed)
S: Stable this AM reporting moderate incisional pain  O: Afebrile  BP 132/68     Pulse 90  Abdomen is soft incision is clean and dry and healing well  Hg 9.0  WBC 3.4  Assessment: Stable S/P TAH BSO  Plan: D/C foley           Ambulate           Add toradol 30 mg IV Q 6 hours            Reassess this PM

## 2011-12-18 NOTE — Progress Notes (Signed)
Pt was given a K pad to help with pain control.  Pt stated that heating pad helped improve her pain, but when pt was asked pain level she stated it was still a 5 out of 10.  Pt is not grimacing and does not appear uncomfortable.  Will continue to monitor.

## 2011-12-18 NOTE — Progress Notes (Signed)
Stable this PM now off PCA still with moderate pain. Now on Toradol 30 mgs Q6 hours. Also no BM yet since 8/23  Afebrile  BP  143/84  Pulse 91  Abdomen is soft and wound is clean  Assessment: Stable off PCA will add Stadol of not enough relief from Tylox and Toradol  Also will add Dulcolax and milk of magnesia for constipation  Path was benign findings with fibroids.  Will reassess in AM 12/19/2011

## 2011-12-18 NOTE — Op Note (Signed)
NAMEBRAYLI, [name] NO.:  [id number]  MEDICAL RECORD NO.:  [id number]  LOCATION:  9306                          FACILITY:  [facility]  PHYSICIAN:  Miguel Aschoff, M.D.       DATE OF BIRTH:  09-Oct-1965  DATE OF PROCEDURE:  12/17/2011 DATE OF DISCHARGE:                              OPERATIVE REPORT   PREOP DIAGNOSIS:  Menorrhagia and fibroids.  POSTOP DIAGNOSIS:  Menorrhagia and fibroids.  PROCEDURE:  Total abdominal hysterectomy and bilateral salpingo- oophorectomy.  SURGEON:  Dr. Miguel Aschoff.  ASSISTANT:  Dr. Lodema Hong.  ANESTHESIA:  General.  COMPLICATIONS:  None.  JUSTIFICATION:  The patient is a 46 year old white female with history of very heavy menses to the point that she required a blood transfusion. On ultrasound, she was demonstrated to have multiple small fibroids. Because of the heavy bleeding, she has requested that a definitive procedure be carried out in effort to control the bleeding dysmenorrhea. She has given informed consent for abdominal hysterectomy and bilateral salpingo-oophorectomy.  She understands the implications of the oophorectomy as well as hormone replacement therapy.  Informed consent has been obtained.  PROCEDURE:  The patient was taken to the operating room, placed in supine position.  General anesthesia was administered without difficulty.  She was then placed in the dorsal lithotomy position, prepped and draped in the usual sterile fashion.  Foley catheter was inserted.  At this point, a previous Pfannenstiel incision was incised and extended down to the subcutaneous tissue.  Bleeding points were clamped and coagulated as they were encountered.  The fascia was then identified and incised transversely and then separated from the underlying rectus muscles.  Rectus muscles were divided in the midline. Once this was done, the peritoneum was found and entered carefully underlying structures.  The peritoneal incision was then  extended under direct visualization.  Self-retaining retractor was then placed through the wound and the viscera were packed out of the pelvis.  Inspection revealed the uterus to be anterior, small fibroids were noted most of them less than 3 cm.  There were peritubal and periovarian adhesions holding the tubes to the appendices epiploica.  The cul-de-sac was unremarkable.  The tubes and ovaries were unremarkable except for findings of a prior tubal sterilization.  The adhesions were released restoring the anatomy to a normal configuration and at this point, the round ligaments were identified, suture ligated, and cut.  A bladder flap was then created with care to protect the bladder.  The bladder had been advanced due to prior cesarean sections.  Again the bladder was taken down without difficulty and without injury to the bladder.  At this point, a large Kelly clamps were placed across the utero-ovarian ligaments.  The infundibulopelvic ligament was identified, as were the ureters.  Perforation was made below the infundibulopelvic ligaments bilaterally.  They were clamped, cut, and suture ligated using suture ligatures of 0 Vicryl and then free ties of 0 Vicryl.  At this point, broad ligament was skeletonized uterine vessels found, clamped with curved Heaney clamps.  These pedicles cut and suture ligated using suture ligatures of 0 Vicryl.  Posteriorly, there was some advancement of the rectum on  to the posterior surface of the cul-de-sac which was taken down again with care to avoid any injury to the rectum with this being done, it was possible to clamp the uterosacral ligaments with curved Heaney clamps.  These pedicles were cut and suture ligated using suture ligatures of 0 Vicryl.  Then the cardinal ligaments were clamped, cut, and suture ligated in a similar fashion.  At this point, curved Heaney clamps were placed across the reflection of the cervix and vagina and at this point,  the specimen was cut free.  The specimen consisting of the cervix, uterus, tubes, and ovaries.  The angles of the vaginal cuff were then ligated using figure-of-eight sutures of 0 Vicryl.  Then the cuff was closed using interrupted figure-of-eight sutures of 0 Vicryl.  Inspection was made for hemostasis.  Hemostasis appeared to be excellent and at this point, the pelvis irrigated with warm saline. Final inspection for hemostasis was made again it was excellent.  Lap counts and instrument counts were then taken and then the abdomen was closed.  The parietal peritoneum was closed using running continuous 0 Vicryl suture.  The fascia was closed using 2 sutures of 0 Vicryl, each starting at lateral fashion with angles meeting in the midline.  The subcutaneous tissue was closed using interrupted 0 Vicryl suture.  The skin incision was closed using staples.  The estimated blood loss was approximately 150 mL.  The patient tolerated the procedure well, went to the recovery room in satisfactory condition.     Miguel Aschoff, M.D.     AR/MEDQ  D:  12/17/2011  T:  12/18/2011  Job:  [id number]

## 2011-12-19 LAB — GLUCOSE, CAPILLARY
Glucose-Capillary: 126 mg/dL — ABNORMAL HIGH (ref 70–99)
Glucose-Capillary: 138 mg/dL — ABNORMAL HIGH (ref 70–99)
Glucose-Capillary: 145 mg/dL — ABNORMAL HIGH (ref 70–99)

## 2011-12-19 MED ORDER — FLEET ENEMA 7-19 GM/118ML RE ENEM: 1.0000 | ENEMA | Freq: Every day | RECTAL | Status: DC | PRN

## 2011-12-19 MED ORDER — BISACODYL 10 MG RE SUPP
10.0000 mg | Freq: Once | RECTAL | Status: AC
  Administered 2011-12-19: 10 mg via RECTAL
  Filled 2011-12-19: qty 1

## 2011-12-19 MED ORDER — MINERAL OIL RE ENEM: 1.0000 | ENEMA | Freq: Once | RECTAL | Status: DC

## 2011-12-19 MED ORDER — LACTULOSE 10 GM/15ML PO SOLN
30.0000 g | Freq: Two times a day (BID) | ORAL | Status: DC | PRN
  Administered 2011-12-19: 20 g via ORAL
  Filled 2011-12-19: qty 45

## 2011-12-19 NOTE — Progress Notes (Signed)
S: Doing well but no BM yet. Is taking her diet well and ambulating but uncomfortable from incision and lack of BM  O: Afebrile  BP 119/84  Abdomen is soft wound clean dry and healing well  A: Stable post op but now constipated.  Will give Dulcolax suppository this AM  Reassess this afternoon

## 2011-12-20 LAB — GLUCOSE, CAPILLARY: Glucose-Capillary: 150 mg/dL — ABNORMAL HIGH (ref 70–99)

## 2011-12-20 NOTE — Progress Notes (Signed)
S: Still no BM but is passing gas. Good pain control with oral pain meds.  O: Afebrile  BP 116/75   Pulse 92  Abdomen is soft and would is healing well  Assessment: Stable post op  Plan: Discharge home           Return to office in four weeks           Meds: Tylox one or two every four hours as  Needed for pain            ADA diet            No heavy lifting and nothing per vagina            Resume prior meds           Condition improved            Final diagnosis : menometrorrhagia secondary to fibroids

## 2011-12-31 NOTE — Discharge Summary (Signed)
NAMEJOLEA, [name] NO.:  [id number]  MEDICAL RECORD NO.:  [id number]  LOCATION:  9306                          FACILITY:  [facility]  PHYSICIAN:  Miguel Aschoff, M.D.       DATE OF BIRTH:  1966-04-07  DATE OF ADMISSION:  12/17/2011 DATE OF DISCHARGE:  12/20/2011                              DISCHARGE SUMMARY   ADMISSION DIAGNOSES:  Menorrhagia, anemia, uterine fibroids.  FINAL DIAGNOSES:  Menorrhagia, anemia, uterine fibroids.  OPERATIONS AND PROCEDURES:  Total abdominal hysterectomy.  BRIEF HISTORY:  The patient is a 46 year old white female with history of prolonged and heavy vaginal bleeding to the point that the patient required a blood transfusion.  On evaluation of the etiology of the bleeding, a CT scan was performed and revealed that the patient did have multiple uterine fibroids.  Due to the heavy bleeding and desire for her to have definitive treatment of this, the patient requested that hysterectomy be performed.  The patient did have a prior history of cesarean sections.  She was therefore admitted to the hospital on December 17, 2011.  Preoperative studies were obtained.  Her chemistry profile revealed slight elevation of her sugar to 128.  PT and PTT were within normal limits.  Hemoglobin on admission was 9.6, white count 3.4.  HOSPITAL COURSE:  On December 17, 2011, she was taken to the operating room, at which time, a total abdominal hysterectomy was carried out without difficulty.  Findings at the time of surgery revealed small uterine fibroids.  The ovaries appeared to be within normal limits. There were no other abnormalities noted at the time of the laparotomy. The patient's postoperative course was essentially uncomplicated except for a mild ileus and constipation, which did slowly respond to oral agents.  Her hemoglobin nadir was 9.0.  Her other vital signs remained stable.  On the third postoperative day, she was felt to be in satisfactory  condition, to be discharged home.  Medications for home included Tylox 1 every 3-4 hours as needed for pain.  She was instructed to get an over-the-counter laxative and remain on this medication.  She was to call for any problems such as fever, pain, or heavy bleeding. She resumed no heavy lifting, place nothing in the vagina and not to drive for approximately 10 days.  The patient was setup a followup visit for 4 weeks.  The pathology report revealed the uterus to have mixed pattern endometrium, no hyperplasia was found.  The myometrium showed benign leiomyomata.  Cervix was unremarkable.  The ovaries were benign. The patient was instructed to resume her other preoperative medications and was sent home in satisfactory condition on a carbohydrate restricted diet.  Her additional medication for home included estradiol 1 mg daily.    Miguel Aschoff, M.D.    AR/MEDQ  D:  12/30/2011  T:  12/31/2011  Job:  [id number]

## 2012-02-03 ENCOUNTER — Encounter: Payer: Self-pay | Admitting: Internal Medicine

## 2012-05-22 ENCOUNTER — Encounter: Payer: Self-pay | Admitting: Internal Medicine

## 2012-05-22 ENCOUNTER — Ambulatory Visit (INDEPENDENT_AMBULATORY_CARE_PROVIDER_SITE_OTHER): Payer: BC Managed Care – PPO | Admitting: Internal Medicine

## 2012-05-22 VITALS — BP 122/80 | HR 116 | Temp 98.7°F | Ht 62.0 in | Wt 143.0 lb

## 2012-05-22 DIAGNOSIS — Z0000 Encounter for general adult medical examination without abnormal findings: Secondary | ICD-10-CM

## 2012-05-22 DIAGNOSIS — IMO0001 Reserved for inherently not codable concepts without codable children: Secondary | ICD-10-CM

## 2012-05-22 DIAGNOSIS — G4700 Insomnia, unspecified: Secondary | ICD-10-CM

## 2012-05-22 DIAGNOSIS — B1710 Acute hepatitis C without hepatic coma: Secondary | ICD-10-CM

## 2012-05-22 DIAGNOSIS — F3289 Other specified depressive episodes: Secondary | ICD-10-CM

## 2012-05-22 DIAGNOSIS — F329 Major depressive disorder, single episode, unspecified: Secondary | ICD-10-CM

## 2012-05-22 MED ORDER — LANCETS MISC: 1.0000 "application " | Freq: Three times a day (TID) | Status: DC

## 2012-05-22 MED ORDER — ZOLPIDEM TARTRATE 10 MG PO TABS: 10.0000 mg | ORAL_TABLET | Freq: Every evening | ORAL | Status: DC | PRN

## 2012-05-22 MED ORDER — INSULIN ASPART 100 UNIT/ML ~~LOC~~ SOLN: 2.0000 [IU] | Freq: Three times a day (TID) | SUBCUTANEOUS | Status: DC

## 2012-05-22 MED ORDER — METFORMIN HCL 500 MG PO TABS: 500.0000 mg | ORAL_TABLET | Freq: Two times a day (BID) | ORAL | Status: DC

## 2012-05-22 MED ORDER — FLUOXETINE HCL 20 MG PO CAPS: 20.0000 mg | ORAL_CAPSULE | Freq: Every day | ORAL | Status: DC

## 2012-05-22 MED ORDER — INSULIN PEN NEEDLE 32G X 4 MM MISC: 1.0000 "application " | Freq: Three times a day (TID) | Status: DC

## 2012-05-22 NOTE — Progress Notes (Signed)
Subjective:    Patient ID: [name], female    DOB: [birth year], 47 y.o.   MRN: [id number]  HPI  Here to f/u; overall doing ok,  Pt denies chest pain, increased sob or doe, wheezing, orthopnea, PND, increased LE swelling, palpitations, dizziness or syncope.  Pt denies polydipsia, polyuria, or low sugar symptoms such as weakness or confusion improved with po intake.  Pt denies new neurological symptoms such as new headache, or facial or extremity weakness or numbness.   Pt states overall good compliance with meds, has been trying to follow lower cholesterol, diabetic diet, with wt overall stable,  but little exercise however. Unfortunately had n/v/GI upset with 1000 bid metformin , was only supposed to be taking 500 bid anyway, has some confusion about her meds, not taking the novolog at all.  CBG's recently > 150.  Has had mild to mod worsening depressive symptoms, but no suicidal ideation, or panic; has ongoing anxiety, also increased recently, and unable to get to sleep most nights the past month Past Medical History  Diagnosis Date  . Impaired glucose tolerance 11/07/2010  . Hepatitis C approx dx 2005    no tx to date  . Pancreatitis     HX of  . Alcohol dependency   . GERD (gastroesophageal reflux disease)   . Chronic constipation   . Gallstones   . Drug dependency     Hx of cocaine use  . Depression   . Rheumatoid factor positive   . Hemochromatosis     last phlebotomy tx 2-3 yrs  . Pancytopenia   . Abnormal vaginal bleeding     uterine fibroid  . Gallstone 11/09/2010  . Chronic abdominal pain 11/09/2010  . Cirrhosis 11/09/2010  . Type II or unspecified type diabetes mellitus without mention of complication, uncontrolled 09/17/2011  . Anxiety 09/17/2011   Past Surgical History  Procedure Date  . Appendectomy   . Cesarean section x 2  . Foot surgery 2013  . Abdominal hysterectomy 12/17/2011    Procedure: HYSTERECTOMY ABDOMINAL;  Surgeon: Miguel Aschoff, MD;  Location: [facility];   Service: Gynecology;  Laterality: N/A;  . Salpingoophorectomy 12/17/2011    Procedure: SALPINGO OOPHERECTOMY;  Surgeon: Miguel Aschoff, MD;  Location: [facility];  Service: Gynecology;  Laterality: Bilateral;    reports that she has quit smoking. She does not have any smokeless tobacco history on file. She reports that she drinks alcohol. She reports that she does not use illicit drugs. family history includes Cancer in her mother and Diabetes in her father. Allergies  Allergen Reactions  . Iodinated Diagnostic Agents Hives    Pt states allergic to Betadine.   Current Outpatient Prescriptions on File Prior to Visit  Medication Sig Dispense Refill  . insulin aspart (NOVOLOG) 100 UNIT/ML injection Inject 2 Units into the skin 3 (three) times daily before meals. Patient states that she uses the novolog only when her cbg is greater then 200mg /dl.  1 vial  11  . metFORMIN (GLUCOPHAGE) 500 MG tablet Take 1 tablet (500 mg total) by mouth 2 (two) times daily with a meal.  180 tablet  3  . Multiple Vitamin (MULTIVITAMIN WITH MINERALS) TABS Take 1 tablet by mouth daily.      Marland Kitchen OVER THE COUNTER MEDICATION Take 1 tablet by mouth daily as needed. For bloating or puffiness. Pt states that she takes an over the counter diuretic      . FLUoxetine (PROZAC) 20 MG capsule Take 1 capsule (20 mg total)  by mouth daily.  90 capsule  3   Review of Systems  Constitutional: Negative for unexpected weight change, or unusual diaphoresis  HENT: Negative for tinnitus.   Eyes: Negative for photophobia and visual disturbance.  Respiratory: Negative for choking and stridor.   Gastrointestinal: Negative for vomiting and blood in stool.  Genitourinary: Negative for hematuria and decreased urine volume.  Musculoskeletal: Negative for acute joint swelling Skin: Negative for color change and wound.  Neurological: Negative for tremors and numbness other than noted  Psychiatric/Behavioral: Negative for decreased concentration or   hyperactivity.       Objective:   Physical Exam BP 122/80  Pulse 116  Temp 98.7 F (37.1 C) (Oral)  Ht 5\' 2"  (1.575 m)  Wt 143 lb (64.864 kg)  BMI 26.15 kg/m2  SpO2 93% VS noted, not ill appearing Constitutional: Pt appears well-developed and well-nourished.  HENT: Head: NCAT.  Right Ear: External ear normal.  Left Ear: External ear normal.  Eyes: Conjunctivae and EOM are normal. Pupils are equal, round, and reactive to light.  Neck: Normal range of motion. Neck supple.  Cardiovascular: Normal rate and regular rhythm.   Pulmonary/Chest: Effort normal and breath sounds normal.  Abd:  Soft, NT, non-distended, + BS Neurological: Pt is alert. Not confused  Skin: Skin is warm. No erythema.  Psychiatric: Pt behavior is normal. Thought content normal.+ depressed affect, 1+ nervous     Assessment & Plan:

## 2012-05-22 NOTE — Assessment & Plan Note (Addendum)
ECG reviewed as per emr, ? Control,  stable overall by history and exam but not taking meds as prescribed, recent data reviewed with pt, and pt to re-start medical treatment as before,  to f/u any worsening symptoms Lab Results  Component Value Date   HGBA1C 5.9 09/17/2011

## 2012-05-22 NOTE — Patient Instructions (Addendum)
Please take all new medication as prescribed Please continue all other medications as before, including re-starting your usual meds Please have the pharmacy call with any other refills you may need. Please call with your blood sugars by Tuesday next week Please continue your efforts at being more active, low cholesterol diet, and weight control. Your EKG was OK today Please call if you need referral to [facility] clinic Thank you for enrolling in MyChart. Please follow the instructions below to securely access your online medical record. MyChart allows you to send messages to your doctor, view your test results, renew your prescriptions, schedule appointments, and more. To Log into My Chart online, please go by [employer] or [employer] to [email or web address], or download the MyChart App from the [employer] of [employer] .  Your Username is: [email or web address] ([email or web address]) Please send a practice Message on Mychart later today. Please return in 6 months, or sooner if needed, with Lab testing done 3-5 days before

## 2012-05-23 ENCOUNTER — Encounter: Payer: Self-pay | Admitting: Internal Medicine

## 2012-05-23 DIAGNOSIS — G4700 Insomnia, unspecified: Secondary | ICD-10-CM | POA: Insufficient documentation

## 2012-05-23 NOTE — Assessment & Plan Note (Signed)
I encouraged referral to Hep C clinic for re-eval in light of new tx, but she declines at this time pending tx of her other co-morbids

## 2012-05-23 NOTE — Assessment & Plan Note (Signed)
Ok for ambien prn,.  to f/u any worsening symptoms or concerns  

## 2012-05-23 NOTE — Assessment & Plan Note (Signed)
For prozac start, declines counseling or referral to psychiatry, verifiednonsuicidal,  to f/u any worsening symptoms or concerns

## 2012-05-26 ENCOUNTER — Telehealth: Payer: Self-pay | Admitting: Internal Medicine

## 2012-05-26 NOTE — Telephone Encounter (Signed)
[facility] pharmacy is calling to verify the patients novolog that was called in, call the pharmacy at [phone]

## 2012-05-27 MED ORDER — INSULIN ASPART 100 UNIT/ML ~~LOC~~ SOLN: 2.0000 [IU] | Freq: Three times a day (TID) | SUBCUTANEOUS | Status: DC

## 2012-05-29 ENCOUNTER — Other Ambulatory Visit: Payer: Self-pay

## 2012-05-29 ENCOUNTER — Telehealth: Payer: Self-pay | Admitting: Internal Medicine

## 2012-05-29 MED ORDER — LANCETS MISC: Status: DC

## 2012-05-29 MED ORDER — LINAGLIPTIN 5 MG PO TABS: 5.0000 mg | ORAL_TABLET | Freq: Every day | ORAL | Status: DC

## 2012-05-29 NOTE — Telephone Encounter (Signed)
Caller: [name]/Patient; Phone: [phone]; Patient missed a call this am from the office earlier.    Also needs to report her blood glucose readings for this week.   She will be at [phone].

## 2012-05-29 NOTE — Telephone Encounter (Signed)
Please tell pt thanks for doing the cbg's, which have improved with med use over the past wk, but still high recently > 200  We cant increase the metformin further as she had GI upset with higher dose before  We should ADD a new medication called Tradjenta 5 qd (ok for 2 wks samples if we have them) AND continue all other meds (no need to stop the metformin or Novolog)  I sent new rx for tradjenta as well

## 2012-05-29 NOTE — Telephone Encounter (Signed)
Patient informed of MD instrucitons.  Left samples at the front for the patient to pickup.

## 2012-05-29 NOTE — Telephone Encounter (Signed)
BS Readings Tues. 375, [phone], 426 WEd. 322, 263, 233 Thurs. 248, 228 , 238 Frid. 279

## 2012-06-06 ENCOUNTER — Other Ambulatory Visit: Payer: Self-pay

## 2012-06-15 ENCOUNTER — Encounter (HOSPITAL_COMMUNITY): Payer: Self-pay | Admitting: Emergency Medicine

## 2012-06-15 ENCOUNTER — Emergency Department (HOSPITAL_COMMUNITY): Payer: BC Managed Care – PPO

## 2012-06-15 ENCOUNTER — Emergency Department (HOSPITAL_COMMUNITY)
Admission: EM | Admit: 2012-06-15 | Discharge: 2012-06-15 | Disposition: A | Discharge status: Home / Self Care | Payer: BC Managed Care – PPO | Attending: Emergency Medicine | Admitting: Emergency Medicine

## 2012-06-15 DIAGNOSIS — Z8719 Personal history of other diseases of the digestive system: Secondary | ICD-10-CM | POA: Insufficient documentation

## 2012-06-15 DIAGNOSIS — Y929 Unspecified place or not applicable: Secondary | ICD-10-CM | POA: Insufficient documentation

## 2012-06-15 DIAGNOSIS — Y9301 Activity, walking, marching and hiking: Secondary | ICD-10-CM | POA: Insufficient documentation

## 2012-06-15 DIAGNOSIS — Z862 Personal history of diseases of the blood and blood-forming organs and certain disorders involving the immune mechanism: Secondary | ICD-10-CM | POA: Insufficient documentation

## 2012-06-15 DIAGNOSIS — F411 Generalized anxiety disorder: Secondary | ICD-10-CM | POA: Insufficient documentation

## 2012-06-15 DIAGNOSIS — Z8742 Personal history of other diseases of the female genital tract: Secondary | ICD-10-CM | POA: Insufficient documentation

## 2012-06-15 DIAGNOSIS — Z87891 Personal history of nicotine dependence: Secondary | ICD-10-CM | POA: Insufficient documentation

## 2012-06-15 DIAGNOSIS — F329 Major depressive disorder, single episode, unspecified: Secondary | ICD-10-CM | POA: Insufficient documentation

## 2012-06-15 DIAGNOSIS — F10929 Alcohol use, unspecified with intoxication, unspecified: Secondary | ICD-10-CM

## 2012-06-15 DIAGNOSIS — R296 Repeated falls: Secondary | ICD-10-CM | POA: Insufficient documentation

## 2012-06-15 DIAGNOSIS — R404 Transient alteration of awareness: Secondary | ICD-10-CM | POA: Insufficient documentation

## 2012-06-15 DIAGNOSIS — R739 Hyperglycemia, unspecified: Secondary | ICD-10-CM

## 2012-06-15 DIAGNOSIS — S40019A Contusion of unspecified shoulder, initial encounter: Secondary | ICD-10-CM | POA: Insufficient documentation

## 2012-06-15 DIAGNOSIS — Z794 Long term (current) use of insulin: Secondary | ICD-10-CM | POA: Insufficient documentation

## 2012-06-15 DIAGNOSIS — S40012A Contusion of left shoulder, initial encounter: Secondary | ICD-10-CM

## 2012-06-15 DIAGNOSIS — F3289 Other specified depressive episodes: Secondary | ICD-10-CM | POA: Insufficient documentation

## 2012-06-15 DIAGNOSIS — Z8739 Personal history of other diseases of the musculoskeletal system and connective tissue: Secondary | ICD-10-CM | POA: Insufficient documentation

## 2012-06-15 DIAGNOSIS — E876 Hypokalemia: Secondary | ICD-10-CM | POA: Insufficient documentation

## 2012-06-15 DIAGNOSIS — Z8619 Personal history of other infectious and parasitic diseases: Secondary | ICD-10-CM | POA: Insufficient documentation

## 2012-06-15 DIAGNOSIS — W19XXXA Unspecified fall, initial encounter: Secondary | ICD-10-CM

## 2012-06-15 DIAGNOSIS — R42 Dizziness and giddiness: Secondary | ICD-10-CM | POA: Insufficient documentation

## 2012-06-15 DIAGNOSIS — Z79899 Other long term (current) drug therapy: Secondary | ICD-10-CM | POA: Insufficient documentation

## 2012-06-15 DIAGNOSIS — Z8639 Personal history of other endocrine, nutritional and metabolic disease: Secondary | ICD-10-CM | POA: Insufficient documentation

## 2012-06-15 DIAGNOSIS — E1169 Type 2 diabetes mellitus with other specified complication: Secondary | ICD-10-CM | POA: Insufficient documentation

## 2012-06-15 DIAGNOSIS — F1010 Alcohol abuse, uncomplicated: Secondary | ICD-10-CM | POA: Insufficient documentation

## 2012-06-15 DIAGNOSIS — R55 Syncope and collapse: Secondary | ICD-10-CM | POA: Insufficient documentation

## 2012-06-15 LAB — CBC WITH DIFFERENTIAL/PLATELET
Basophils Absolute: 0 10*3/uL (ref 0.0–0.1)
Basophils Relative: 0 % (ref 0–1)
Eosinophils Absolute: 0.1 10*3/uL (ref 0.0–0.7)
Eosinophils Relative: 3 % (ref 0–5)
HCT: 42.1 % (ref 36.0–46.0)
Hemoglobin: 14.5 g/dL (ref 12.0–15.0)
Lymphocytes Relative: 44 % (ref 12–46)
Lymphs Abs: 2.1 10*3/uL (ref 0.7–4.0)
MCH: 35.2 pg — ABNORMAL HIGH (ref 26.0–34.0)
MCHC: 34.4 g/dL (ref 30.0–36.0)
MCV: 102.2 fL — ABNORMAL HIGH (ref 78.0–100.0)
Monocytes Absolute: 0.7 10*3/uL (ref 0.1–1.0)
Monocytes Relative: 15 % — ABNORMAL HIGH (ref 3–12)
Neutro Abs: 1.7 10*3/uL (ref 1.7–7.7)
Neutrophils Relative %: 38 % — ABNORMAL LOW (ref 43–77)
Platelets: 76 10*3/uL — ABNORMAL LOW (ref 150–400)
RBC: 4.12 MIL/uL (ref 3.87–5.11)
RDW: 13.5 % (ref 11.5–15.5)
WBC: 4.6 10*3/uL (ref 4.0–10.5)

## 2012-06-15 LAB — COMPREHENSIVE METABOLIC PANEL
ALT: 168 U/L — ABNORMAL HIGH (ref 0–35)
AST: 161 U/L — ABNORMAL HIGH (ref 0–37)
Albumin: 3.1 g/dL — ABNORMAL LOW (ref 3.5–5.2)
Alkaline Phosphatase: 86 U/L (ref 39–117)
BUN: 4 mg/dL — ABNORMAL LOW (ref 6–23)
CO2: 31 mEq/L (ref 19–32)
Calcium: 8.5 mg/dL (ref 8.4–10.5)
Chloride: 98 mEq/L (ref 96–112)
Creatinine, Ser: 0.66 mg/dL (ref 0.50–1.10)
GFR calc Af Amer: >90 mL/min (ref >90)
GFR calc non Af Amer: >90 mL/min (ref >90)
Glucose, Bld: 287 mg/dL — ABNORMAL HIGH (ref 70–99)
Potassium: 3.2 mEq/L — ABNORMAL LOW (ref 3.5–5.1)
Sodium: 138 mEq/L (ref 135–145)
Total Bilirubin: 0.7 mg/dL (ref 0.3–1.2)
Total Protein: 7.4 g/dL (ref 6.0–8.3)

## 2012-06-15 LAB — GLUCOSE, CAPILLARY: Glucose-Capillary: 305 mg/dL — ABNORMAL HIGH (ref 70–99)

## 2012-06-15 LAB — ETHANOL: Alcohol, Ethyl (B): 276 mg/dL — ABNORMAL HIGH (ref 0–11)

## 2012-06-15 LAB — POTASSIUM: Potassium: 3 mEq/L — ABNORMAL LOW (ref 3.5–5.1)

## 2012-06-15 MED ORDER — SODIUM CHLORIDE 0.9 % IV BOLUS (SEPSIS)
1000.0000 mL | Freq: Once | INTRAVENOUS | Status: AC
  Administered 2012-06-15: 1000 mL via INTRAVENOUS

## 2012-06-15 MED ORDER — FENTANYL CITRATE 0.05 MG/ML IJ SOLN
50.0000 ug | Freq: Once | INTRAMUSCULAR | Status: AC
  Administered 2012-06-15: 50 ug via INTRAVENOUS
  Filled 2012-06-15: qty 2

## 2012-06-15 MED ORDER — POTASSIUM CHLORIDE CRYS ER 20 MEQ PO TBCR
60.0000 meq | EXTENDED_RELEASE_TABLET | Freq: Once | ORAL | Status: AC
  Administered 2012-06-15: 60 meq via ORAL
  Filled 2012-06-15: qty 3

## 2012-06-15 NOTE — ED Provider Notes (Signed)
History  This chart was scribed for Raeford Razor, MD by Bennett Scrape, ED Scribe. This patient was seen in room WA07/WA07 and the patient's care was started at 5:48 PM.  CSN: [id number]  Arrival date & time 06/15/12  1737   First MD Initiated Contact with Patient 06/15/12 1748      Chief Complaint  Patient presents with  . Hyperglycemia  . Fall     The history is provided by the patient. No language interpreter was used.    [name] is a 47 y.o. female brought in by ambulance in a c-collar and on a LSB, who presents to the Emergency Department complaining of one episode of syncope that occurred while she was walking to the bathroom PTA that she attributes to hyperglycemia. Pt states that she began to feel dizzy and lost consciousness for an unknown duration of time. She is unsure how she landed or if she hit her head. She was with her mother at the time but states that she was on the phone and did not witness the incident. She denies being ambulatory since the incident. She c/o left shoulder pain currently. She reports that she has a h/o DM and takes metformin and insulin but states that she did not take her medication this morning. Last CBG was 326 yesterday evening. She reports prior episodes of syncope attributed to hyperglycemia. She denies being on any anticoagulants currently. She denies having a h/o seizures or recent falls. She reports consuming one alcohol drink 4 hours ago but denies any illegal drug use. She denies neck pain, back pain, and numbness or tingling in her hands as associated symptoms. She has a h/o Hep C, GERD and cirrhosis and is a former smoker.  Past Medical History  Diagnosis Date  . Impaired glucose tolerance 11/07/2010  . Hepatitis C approx dx 2005    no tx to date  . Pancreatitis     HX of  . Alcohol dependency   . GERD (gastroesophageal reflux disease)   . Chronic constipation   . Gallstones   . Drug dependency     Hx of cocaine use  .  Depression   . Rheumatoid factor positive   . Hemochromatosis     last phlebotomy tx 2-3 yrs  . Pancytopenia   . Abnormal vaginal bleeding     uterine fibroid  . Gallstone 11/09/2010  . Chronic abdominal pain 11/09/2010  . Cirrhosis 11/09/2010  . Type II or unspecified type diabetes mellitus without mention of complication, uncontrolled 09/17/2011  . Anxiety 09/17/2011    Past Surgical History  Procedure Laterality Date  . Appendectomy    . Cesarean section  x 2  . Foot surgery  2013  . Abdominal hysterectomy  12/17/2011    Procedure: HYSTERECTOMY ABDOMINAL;  Surgeon: Miguel Aschoff, MD;  Location: [facility];  Service: Gynecology;  Laterality: N/A;  . Salpingoophorectomy  12/17/2011    Procedure: SALPINGO OOPHERECTOMY;  Surgeon: Miguel Aschoff, MD;  Location: [facility];  Service: Gynecology;  Laterality: Bilateral;    Family History  Problem Relation Age of Onset  . Cancer Mother     ovarian  . Diabetes Father     History  Substance Use Topics  . Smoking status: Former Games developer  . Smokeless tobacco: Not on file  . Alcohol Use: Yes    OB History   Grav Para Term Preterm Abortions TAB SAB Ect Mult Living   2 2 2        3  Review of Systems  HENT: Negative for neck pain.   Musculoskeletal: Positive for arthralgias (left shoulder). Negative for back pain.  Skin: Negative for wound.  Neurological: Positive for dizziness and syncope. Negative for numbness.  All other systems reviewed and are negative.    Allergies  Iodinated diagnostic agents  Home Medications   Current Outpatient Rx  Name  Route  Sig  Dispense  Refill  . FLUoxetine (PROZAC) 20 MG capsule   Oral   Take 1 capsule (20 mg total) by mouth daily.   90 capsule   3   . insulin aspart (NOVOLOG) 100 UNIT/ML injection   Subcutaneous   Inject 2 Units into the skin 3 (three) times daily before meals. Patient states that she uses the novolog only when her cbg is greater then 200mg /dl.   1 vial   11   . Insulin Pen  Needle (BD PEN NEEDLE NANO U/F) 32G X 4 MM MISC   Does not apply   1 application by Does not apply route 3 (three) times daily.   300 each   11   . Lancets MISC      Use as directed three time daily.  Diagnosis code 250.02   200 each   11   . linagliptin (TRADJENTA) 5 MG TABS tablet   Oral   Take 1 tablet (5 mg total) by mouth daily.   90 tablet   3   . metFORMIN (GLUCOPHAGE) 500 MG tablet   Oral   Take 1 tablet (500 mg total) by mouth 2 (two) times daily with a meal.   180 tablet   3   . Multiple Vitamin (MULTIVITAMIN WITH MINERALS) TABS   Oral   Take 1 tablet by mouth daily.         Marland Kitchen OVER THE COUNTER MEDICATION   Oral   Take 1 tablet by mouth daily as needed. For bloating or puffiness. Pt states that she takes an over the counter diuretic         . zolpidem (AMBIEN) 10 MG tablet   Oral   Take 1 tablet (10 mg total) by mouth at bedtime as needed for sleep.   90 tablet   1     SpO2 97%  Physical Exam  Nursing note and vitals reviewed. Constitutional: She is oriented to person, place, and time. She appears well-developed and well-nourished. No distress. Cervical collar and backboard in place.  HENT:  Head: Normocephalic and atraumatic.  Eyes: Conjunctivae and EOM are normal. Pupils are equal, round, and reactive to light.  Neck: No tracheal deviation present.  Cardiovascular: Normal rate and regular rhythm.   Pulmonary/Chest: Effort normal and breath sounds normal. No respiratory distress.  Abdominal: Soft. There is no tenderness.  Small area of ecchymosis on LLQ  Musculoskeletal: Normal range of motion.  Tenderness over the left anterior shoulder and lateral third of left clavicle, no midline spinal tenderness, neurovascularly intact distally  Neurological: She is alert and oriented to person, place, and time.  Skin: Skin is warm and dry.  Psychiatric: She has a normal mood and affect. Her behavior is normal.    ED Course  Procedures (including  critical care time)  DIAGNOSTIC STUDIES: Oxygen Saturation is 97% on room air, adequate by my interpretation.    COORDINATION OF CARE: 5:55 PM- Pt removed off of LSB  5:57 PM- Advised pt that I will keep the c-collar for now. Discussed treatment plan which includes XRs of the neck and left shoulder  with pt at bedside and pt agreed to plan.   Labs Reviewed  CBC WITH DIFFERENTIAL - Abnormal; Notable for the following:    MCV 102.2 (*)    MCH 35.2 (*)    All other components within normal limits  COMPREHENSIVE METABOLIC PANEL - Abnormal; Notable for the following:    Potassium 3.2 (*)    Glucose, Bld 287 (*)    BUN 4 (*)    Albumin 3.1 (*)    AST 161 (*)    ALT 168 (*)    All other components within normal limits  ETHANOL - Abnormal; Notable for the following:    Alcohol, Ethyl (B) 276 (*)    All other components within normal limits  GLUCOSE, CAPILLARY - Abnormal; Notable for the following:    Glucose-Capillary 305 (*)    All other components within normal limits  POTASSIUM   Dg Cervical Spine Complete  06/15/2012  *RADIOLOGY REPORT*  Clinical Data: Fall.  Neck pain.  Left shoulder pain.  CERVICAL SPINE - COMPLETE 4+ VIEW  Comparison: 05/03/2009  Findings: The patient was imaged while wearing a cervical collar.  Further reduction and intervertebral disc height at C3-4 noted. Spurring and reduced disc height noted at all levels between C3 and C7, with probable posterior osseous ridging at C6-7 and C7-C1. Uncinate spurring noted bilaterally at C2-3 and C3-4, and to a lesser degree at C5-6 and C6-7.  No overt prevertebral soft tissue swelling noted.  IMPRESSION:  1.  Minimal grade 1 anterior subluxation of C4-5 is most likely to be degenerative rather than post-traumatic, although appears to be new compared to 2011. This could be further assessed with out-of- collar neutral view of the cervical spine or cross-sectional imaging. 2.  Multilevel spurring with loss of disc height at various  levels in the cervical spine, attributable to spondylosis and degenerative disc disease.  This reduces conventional radiography since the sensitivity for fracture.  However, no fracture is directly observed.   Original Report Authenticated By: Gaylyn Rong, M.D.    Ct Head Wo Contrast  06/15/2012  *RADIOLOGY REPORT*  Clinical Data: Syncope, fall, loss of consciousness and headache.  CT HEAD WITHOUT CONTRAST  Technique:  Contiguous axial images were obtained from the base of the skull through the vertex without contrast.  Comparison: 12/14/2010 CT  Findings: Mild generalized cerebral volume loss noted. No acute intracranial abnormalities are identified, including mass lesion or mass effect, hydrocephalus, extra-axial fluid collection, midline shift, hemorrhage, or acute infarction.  The visualized bony calvarium is unremarkable.  IMPRESSION: No evidence of acute intracranial abnormality.   Original Report Authenticated By: Harmon Pier, M.D.    Dg Shoulder Left  06/15/2012  *RADIOLOGY REPORT*  Clinical Data: Fall with left shoulder pain.  LEFT SHOULDER - 2+ VIEW  Comparison: 05/03/2009 radiographs  Findings: There is no evidence of acute fracture, subluxation or dislocation. Erosive changes at the [facility] joint are noted - question inflammatory or rheumatoid arthritis.  The visualized left bony thorax is unremarkable. No other focal bony abnormalities are present.  IMPRESSION: No evidence of acute bony abnormality.  AC joint erosive changes - question inflammatory or rheumatoid arthritis.   Original Report Authenticated By: Harmon Pier, M.D.      1. Shoulder contusion, left, initial encounter   2. Alcohol intoxication   3. Fall, initial encounter   4. Hyperglycemia       MDM  47 year old female with left shoulder pain after mechanical fall. XR neg for fx/dislocation. Neuroimaging performed given intoxication.  This is unremarkable as well. Elevation in LFTs which not surprising given history of alcohol  abuse. Mild hypokalemia. Hyperglycemia. Bicarbonate is normal there is no anion gap. Patient was given IV fluids. She was discharged in the care of a sober adult.   I personally preformed the services scribed in my presence. The recorded information has been reviewed is accurate. Raeford Razor, MD.        Raeford Razor, MD 06/18/12 [phone]

## 2012-06-15 NOTE — ED Notes (Signed)
[id number]> Expected date:<BR> Expected time:<BR> Means of arrival:<BR> Comments:<BR> Hyperglycemic

## 2012-06-15 NOTE — ED Notes (Signed)
Upon entering room, patient was ambulatory with her IVF in hand.  Patient states, "I really have to go to the bathroom."  This nurse told the patient that she was not allowed to get out of bed.  Tech gave patient a bedpan and the patient sat on top of it while it was resting on a chair.

## 2012-06-15 NOTE — ED Notes (Signed)
EMS reports patient had fallen while on her way to the bathroom.  Patient denies head injury and back pain.  Patient c/o left posterior shoulder pain.  Patient is alert and oriented x 4.  Patient is lethargic on arrival.  Patient's fsbs was 370 on scene.  Patient did not take her insulin this morning because she got busy.

## 2012-06-16 LAB — GLUCOSE, CAPILLARY: Glucose-Capillary: 213 mg/dL — ABNORMAL HIGH (ref 70–99)

## 2012-07-01 ENCOUNTER — Telehealth: Payer: Self-pay

## 2012-07-01 MED ORDER — ESOMEPRAZOLE MAGNESIUM 40 MG PO CPDR: 40.0000 mg | DELAYED_RELEASE_CAPSULE | Freq: Every day | ORAL | Status: DC

## 2012-07-01 NOTE — Telephone Encounter (Signed)
Done erx 

## 2012-07-01 NOTE — Telephone Encounter (Signed)
Received refill request for Nexium 40 mg not on current medication list .

## 2012-11-05 ENCOUNTER — Other Ambulatory Visit: Payer: Self-pay | Admitting: Internal Medicine

## 2012-11-22 ENCOUNTER — Other Ambulatory Visit: Payer: Self-pay | Admitting: Internal Medicine

## 2012-11-23 NOTE — Telephone Encounter (Signed)
Faxed hardcopy to [facility] [address] [place] 

## 2012-11-23 NOTE — Telephone Encounter (Signed)
Done hardcopy to [facility]  

## 2012-12-11 ENCOUNTER — Ambulatory Visit: Payer: BC Managed Care – PPO | Admitting: Internal Medicine

## 2012-12-15 ENCOUNTER — Encounter (HOSPITAL_COMMUNITY): Payer: Self-pay | Admitting: Emergency Medicine

## 2012-12-15 ENCOUNTER — Emergency Department (HOSPITAL_COMMUNITY)
Admission: EM | Admit: 2012-12-15 | Discharge: 2012-12-16 | Disposition: A | Discharge status: Other Acute Inpatient Hospital | Payer: BC Managed Care – PPO | Attending: Emergency Medicine | Admitting: Emergency Medicine

## 2012-12-15 DIAGNOSIS — R5381 Other malaise: Secondary | ICD-10-CM

## 2012-12-15 DIAGNOSIS — Z8719 Personal history of other diseases of the digestive system: Secondary | ICD-10-CM | POA: Insufficient documentation

## 2012-12-15 DIAGNOSIS — F1010 Alcohol abuse, uncomplicated: Secondary | ICD-10-CM

## 2012-12-15 DIAGNOSIS — Z794 Long term (current) use of insulin: Secondary | ICD-10-CM | POA: Insufficient documentation

## 2012-12-15 DIAGNOSIS — Z79899 Other long term (current) drug therapy: Secondary | ICD-10-CM | POA: Insufficient documentation

## 2012-12-15 DIAGNOSIS — G8929 Other chronic pain: Secondary | ICD-10-CM | POA: Insufficient documentation

## 2012-12-15 DIAGNOSIS — IMO0001 Reserved for inherently not codable concepts without codable children: Secondary | ICD-10-CM | POA: Insufficient documentation

## 2012-12-15 DIAGNOSIS — F419 Anxiety disorder, unspecified: Secondary | ICD-10-CM

## 2012-12-15 DIAGNOSIS — Z8619 Personal history of other infectious and parasitic diseases: Secondary | ICD-10-CM | POA: Insufficient documentation

## 2012-12-15 DIAGNOSIS — Z87448 Personal history of other diseases of urinary system: Secondary | ICD-10-CM | POA: Insufficient documentation

## 2012-12-15 DIAGNOSIS — K7460 Unspecified cirrhosis of liver: Secondary | ICD-10-CM

## 2012-12-15 DIAGNOSIS — F341 Dysthymic disorder: Secondary | ICD-10-CM | POA: Insufficient documentation

## 2012-12-15 DIAGNOSIS — F329 Major depressive disorder, single episode, unspecified: Secondary | ICD-10-CM

## 2012-12-15 DIAGNOSIS — Y939 Activity, unspecified: Secondary | ICD-10-CM | POA: Insufficient documentation

## 2012-12-15 DIAGNOSIS — B1710 Acute hepatitis C without hepatic coma: Secondary | ICD-10-CM

## 2012-12-15 DIAGNOSIS — Y929 Unspecified place or not applicable: Secondary | ICD-10-CM | POA: Insufficient documentation

## 2012-12-15 DIAGNOSIS — K219 Gastro-esophageal reflux disease without esophagitis: Secondary | ICD-10-CM | POA: Insufficient documentation

## 2012-12-15 DIAGNOSIS — T426X1A Poisoning by other antiepileptic and sedative-hypnotic drugs, accidental (unintentional), initial encounter: Secondary | ICD-10-CM | POA: Insufficient documentation

## 2012-12-15 DIAGNOSIS — Z87891 Personal history of nicotine dependence: Secondary | ICD-10-CM | POA: Insufficient documentation

## 2012-12-15 DIAGNOSIS — Z8669 Personal history of other diseases of the nervous system and sense organs: Secondary | ICD-10-CM | POA: Insufficient documentation

## 2012-12-15 LAB — CBC WITH DIFFERENTIAL/PLATELET
Basophils Absolute: 0 10*3/uL (ref 0.0–0.1)
Basophils Relative: 0 % (ref 0–1)
Eosinophils Relative: 2 % (ref 0–5)
Lymphocytes Relative: 38 % (ref 12–46)
Neutro Abs: 3.5 10*3/uL (ref 1.7–7.7)
Platelets: 129 10*3/uL — ABNORMAL LOW (ref 150–400)
RDW: 13.5 % (ref 11.5–15.5)
WBC: 6.5 10*3/uL (ref 4.0–10.5)

## 2012-12-15 LAB — URINALYSIS, ROUTINE W REFLEX MICROSCOPIC
Nitrite: NEGATIVE
Specific Gravity, Urine: 1.015 (ref 1.005–1.030)
pH: 6 (ref 5.0–8.0)

## 2012-12-15 LAB — COMPREHENSIVE METABOLIC PANEL
ALT: 274 U/L — ABNORMAL HIGH (ref 0–35)
AST: 259 U/L — ABNORMAL HIGH (ref 0–37)
Albumin: 3.8 g/dL (ref 3.5–5.2)
CO2: 31 mEq/L (ref 19–32)
Calcium: 9.7 mg/dL (ref 8.4–10.5)
GFR calc non Af Amer: >90 mL/min (ref >90)
Sodium: 139 mEq/L (ref 135–145)

## 2012-12-15 LAB — RAPID URINE DRUG SCREEN, HOSP PERFORMED
Barbiturates: NOT DETECTED
Cocaine: POSITIVE — AB
Opiates: NOT DETECTED

## 2012-12-15 LAB — SALICYLATE LEVEL: Salicylate Lvl: <2 mg/dL — ABNORMAL LOW (ref 2.8–20.0)

## 2012-12-15 MED ORDER — SODIUM CHLORIDE 0.9 % IV BOLUS (SEPSIS)
1000.0000 mL | Freq: Once | INTRAVENOUS | Status: AC
  Administered 2012-12-15: 1000 mL via INTRAVENOUS

## 2012-12-15 NOTE — ED Notes (Signed)
Bed: WU98 Expected date: 12/15/12 Expected time: 8:33 PM Means of arrival: Ambulance Comments: EMS--overdose

## 2012-12-15 NOTE — ED Notes (Signed)
Brought in by EMS from home with c/o drug overdose.  Per EMS,. Pt has had an argument with spouse--- took 20 tablets of Ambien and drank "4 shots" of alcoholic beverage after the dispute; pt called EMS--- pt denies SI/HI.

## 2012-12-16 ENCOUNTER — Encounter (HOSPITAL_COMMUNITY): Payer: Self-pay | Admitting: Registered Nurse

## 2012-12-16 ENCOUNTER — Inpatient Hospital Stay (HOSPITAL_COMMUNITY)
Admission: AD | Admit: 2012-12-16 | Discharge: 2012-12-19 | DRG: 426 | Disposition: A | Discharge status: Home / Self Care | Payer: BC Managed Care – PPO | Source: Intra-hospital | Attending: Psychiatry | Admitting: Psychiatry

## 2012-12-16 DIAGNOSIS — T50904A Poisoning by unspecified drugs, medicaments and biological substances, undetermined, initial encounter: Secondary | ICD-10-CM

## 2012-12-16 DIAGNOSIS — F3289 Other specified depressive episodes: Principal | ICD-10-CM

## 2012-12-16 DIAGNOSIS — F329 Major depressive disorder, single episode, unspecified: Principal | ICD-10-CM | POA: Diagnosis present

## 2012-12-16 DIAGNOSIS — F411 Generalized anxiety disorder: Secondary | ICD-10-CM | POA: Diagnosis present

## 2012-12-16 DIAGNOSIS — F419 Anxiety disorder, unspecified: Secondary | ICD-10-CM

## 2012-12-16 DIAGNOSIS — F1994 Other psychoactive substance use, unspecified with psychoactive substance-induced mood disorder: Secondary | ICD-10-CM | POA: Diagnosis present

## 2012-12-16 DIAGNOSIS — B1920 Unspecified viral hepatitis C without hepatic coma: Secondary | ICD-10-CM | POA: Diagnosis present

## 2012-12-16 DIAGNOSIS — F1910 Other psychoactive substance abuse, uncomplicated: Secondary | ICD-10-CM

## 2012-12-16 DIAGNOSIS — E119 Type 2 diabetes mellitus without complications: Secondary | ICD-10-CM | POA: Diagnosis present

## 2012-12-16 DIAGNOSIS — F1410 Cocaine abuse, uncomplicated: Secondary | ICD-10-CM

## 2012-12-16 DIAGNOSIS — K219 Gastro-esophageal reflux disease without esophagitis: Secondary | ICD-10-CM | POA: Diagnosis present

## 2012-12-16 DIAGNOSIS — K7460 Unspecified cirrhosis of liver: Secondary | ICD-10-CM | POA: Diagnosis present

## 2012-12-16 DIAGNOSIS — G4700 Insomnia, unspecified: Secondary | ICD-10-CM | POA: Diagnosis present

## 2012-12-16 DIAGNOSIS — R45851 Suicidal ideations: Secondary | ICD-10-CM

## 2012-12-16 DIAGNOSIS — Z87891 Personal history of nicotine dependence: Secondary | ICD-10-CM

## 2012-12-16 DIAGNOSIS — F1020 Alcohol dependence, uncomplicated: Secondary | ICD-10-CM

## 2012-12-16 DIAGNOSIS — T50901A Poisoning by unspecified drugs, medicaments and biological substances, accidental (unintentional), initial encounter: Secondary | ICD-10-CM

## 2012-12-16 LAB — GLUCOSE, CAPILLARY
Glucose-Capillary: 130 mg/dL — ABNORMAL HIGH (ref 70–99)
Glucose-Capillary: 98 mg/dL (ref 70–99)
Glucose-Capillary: 100 mg/dL — ABNORMAL HIGH (ref 70–99)

## 2012-12-16 MED ORDER — LINAGLIPTIN 5 MG PO TABS
5.0000 mg | ORAL_TABLET | Freq: Every day | ORAL | Status: DC
  Administered 2012-12-17 – 2012-12-19 (×3): 5 mg via ORAL
  Filled 2012-12-16 (×5): qty 1

## 2012-12-16 MED ORDER — HYDROXYZINE HCL 25 MG PO TABS: 25.0000 mg | ORAL_TABLET | Freq: Four times a day (QID) | ORAL | Status: DC | PRN

## 2012-12-16 MED ORDER — METFORMIN HCL 500 MG PO TABS
500.0000 mg | ORAL_TABLET | Freq: Two times a day (BID) | ORAL | Status: DC
  Administered 2012-12-17 – 2012-12-19 (×5): 500 mg via ORAL
  Filled 2012-12-16 (×9): qty 1

## 2012-12-16 MED ORDER — POTASSIUM CHLORIDE CRYS ER 20 MEQ PO TBCR
40.0000 meq | EXTENDED_RELEASE_TABLET | Freq: Once | ORAL | Status: AC
  Administered 2012-12-16: 40 meq via ORAL
  Filled 2012-12-16: qty 2

## 2012-12-16 MED ORDER — CHLORDIAZEPOXIDE HCL 25 MG PO CAPS
25.0000 mg | ORAL_CAPSULE | Freq: Once | ORAL | Status: AC
  Administered 2012-12-16: 25 mg via ORAL
  Filled 2012-12-16: qty 1

## 2012-12-16 MED ORDER — CHLORDIAZEPOXIDE HCL 25 MG PO CAPS: 25.0000 mg | ORAL_CAPSULE | Freq: Three times a day (TID) | ORAL | Status: DC

## 2012-12-16 MED ORDER — PANTOPRAZOLE SODIUM 40 MG PO TBEC
40.0000 mg | DELAYED_RELEASE_TABLET | Freq: Every day | ORAL | Status: DC
  Administered 2012-12-16: 40 mg via ORAL
  Filled 2012-12-16: qty 1

## 2012-12-16 MED ORDER — CHLORDIAZEPOXIDE HCL 25 MG PO CAPS
25.0000 mg | ORAL_CAPSULE | Freq: Four times a day (QID) | ORAL | Status: DC
  Administered 2012-12-16: 25 mg via ORAL
  Filled 2012-12-16 (×2): qty 1

## 2012-12-16 MED ORDER — INSULIN ASPART 100 UNIT/ML ~~LOC~~ SOLN: 0.0000 [IU] | Freq: Three times a day (TID) | SUBCUTANEOUS | Status: DC

## 2012-12-16 MED ORDER — CHLORDIAZEPOXIDE HCL 25 MG PO CAPS: 25.0000 mg | ORAL_CAPSULE | ORAL | Status: DC

## 2012-12-16 MED ORDER — ACETAMINOPHEN 325 MG PO TABS: 650.0000 mg | ORAL_TABLET | ORAL | Status: DC | PRN

## 2012-12-16 MED ORDER — IBUPROFEN 200 MG PO TABS
600.0000 mg | ORAL_TABLET | Freq: Three times a day (TID) | ORAL | Status: DC | PRN
  Administered 2012-12-16: 600 mg via ORAL
  Filled 2012-12-16: qty 3

## 2012-12-16 MED ORDER — CHLORDIAZEPOXIDE HCL 25 MG PO CAPS: 25.0000 mg | ORAL_CAPSULE | Freq: Four times a day (QID) | ORAL | Status: DC | PRN

## 2012-12-16 MED ORDER — LORAZEPAM 1 MG PO TABS: 1.0000 mg | ORAL_TABLET | Freq: Three times a day (TID) | ORAL | Status: DC | PRN

## 2012-12-16 MED ORDER — TRAZODONE HCL 50 MG PO TABS
50.0000 mg | ORAL_TABLET | Freq: Every evening | ORAL | Status: DC | PRN
  Administered 2012-12-16 – 2012-12-18 (×3): 50 mg via ORAL
  Filled 2012-12-16 (×10): qty 1

## 2012-12-16 MED ORDER — LOPERAMIDE HCL 2 MG PO CAPS: 2.0000 mg | ORAL_CAPSULE | ORAL | Status: DC | PRN

## 2012-12-16 MED ORDER — ADULT MULTIVITAMIN W/MINERALS CH
1.0000 | ORAL_TABLET | Freq: Every day | ORAL | Status: DC
  Administered 2012-12-16: 1 via ORAL
  Filled 2012-12-16: qty 1

## 2012-12-16 MED ORDER — VITAMIN B-1 100 MG PO TABS
100.0000 mg | ORAL_TABLET | Freq: Every day | ORAL | Status: DC
  Administered 2012-12-17 – 2012-12-19 (×3): 100 mg via ORAL
  Filled 2012-12-16 (×5): qty 1

## 2012-12-16 MED ORDER — ADULT MULTIVITAMIN W/MINERALS CH
1.0000 | ORAL_TABLET | Freq: Every day | ORAL | Status: DC
  Administered 2012-12-17 – 2012-12-19 (×3): 1 via ORAL
  Filled 2012-12-16 (×5): qty 1

## 2012-12-16 MED ORDER — MAGNESIUM HYDROXIDE 400 MG/5ML PO SUSP
30.0000 mL | Freq: Every day | ORAL | Status: DC | PRN
  Administered 2012-12-17 – 2012-12-18 (×2): 30 mL via ORAL

## 2012-12-16 MED ORDER — LINAGLIPTIN 5 MG PO TABS
5.0000 mg | ORAL_TABLET | Freq: Every day | ORAL | Status: DC
  Administered 2012-12-16: 5 mg via ORAL
  Filled 2012-12-16: qty 1

## 2012-12-16 MED ORDER — METFORMIN HCL 500 MG PO TABS
500.0000 mg | ORAL_TABLET | Freq: Two times a day (BID) | ORAL | Status: DC
  Administered 2012-12-16: 500 mg via ORAL
  Filled 2012-12-16 (×2): qty 1

## 2012-12-16 MED ORDER — FLUOXETINE HCL 20 MG PO CAPS
20.0000 mg | ORAL_CAPSULE | Freq: Every day | ORAL | Status: DC
  Administered 2012-12-16: 20 mg via ORAL
  Filled 2012-12-16: qty 1

## 2012-12-16 MED ORDER — PANTOPRAZOLE SODIUM 40 MG PO TBEC
40.0000 mg | DELAYED_RELEASE_TABLET | Freq: Every day | ORAL | Status: DC
  Administered 2012-12-17 – 2012-12-19 (×3): 40 mg via ORAL
  Filled 2012-12-16 (×5): qty 1

## 2012-12-16 MED ORDER — ALUM & MAG HYDROXIDE-SIMETH 200-200-20 MG/5ML PO SUSP: 30.0000 mL | ORAL | Status: DC | PRN

## 2012-12-16 MED ORDER — ONDANSETRON HCL 4 MG PO TABS: 4.0000 mg | ORAL_TABLET | Freq: Three times a day (TID) | ORAL | Status: DC | PRN

## 2012-12-16 MED ORDER — CHLORDIAZEPOXIDE HCL 25 MG PO CAPS: 25.0000 mg | ORAL_CAPSULE | Freq: Every day | ORAL | Status: DC

## 2012-12-16 MED ORDER — ACETAMINOPHEN 325 MG PO TABS: 650.0000 mg | ORAL_TABLET | Freq: Four times a day (QID) | ORAL | Status: DC | PRN

## 2012-12-16 MED ORDER — VITAMIN B-1 100 MG PO TABS: 100.0000 mg | ORAL_TABLET | Freq: Every day | ORAL | Status: DC

## 2012-12-16 MED ORDER — ONDANSETRON 4 MG PO TBDP: 4.0000 mg | ORAL_TABLET | Freq: Four times a day (QID) | ORAL | Status: DC | PRN

## 2012-12-16 MED ORDER — FLUOXETINE HCL 20 MG PO CAPS
20.0000 mg | ORAL_CAPSULE | Freq: Every day | ORAL | Status: DC
  Administered 2012-12-17: 20 mg via ORAL
  Filled 2012-12-16 (×2): qty 1

## 2012-12-16 MED ORDER — POTASSIUM CHLORIDE CRYS ER 20 MEQ PO TBCR
20.0000 meq | EXTENDED_RELEASE_TABLET | Freq: Two times a day (BID) | ORAL | Status: AC
  Administered 2012-12-17 – 2012-12-18 (×3): 20 meq via ORAL
  Filled 2012-12-16 (×4): qty 1

## 2012-12-16 MED ORDER — THIAMINE HCL 100 MG/ML IJ SOLN
100.0000 mg | Freq: Once | INTRAMUSCULAR | Status: AC
  Administered 2012-12-16: 100 mg via INTRAMUSCULAR
  Filled 2012-12-16: qty 2

## 2012-12-16 MED ORDER — THIAMINE HCL 100 MG/ML IJ SOLN: 100.0000 mg | Freq: Once | INTRAMUSCULAR | Status: DC

## 2012-12-16 NOTE — ED Notes (Signed)
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

## 2012-12-16 NOTE — Tx Team (Signed)
Initial Interdisciplinary Treatment Plan  PATIENT STRENGTHS: (choose at least two) Active sense of humor Capable of independent living Financial means  PATIENT STRESSORS: Legal issue Marital or family conflict Medication change or noncompliance Substance abuse   PROBLEM LIST: Problem List/Patient Goals Date to be addressed Date deferred Reason deferred Estimated date of resolution  sleep 12/16/2012     depression 12/16/2012     anxiety 12/16/2012                                          DISCHARGE CRITERIA:  Ability to meet basic life and health needs Adequate post-discharge living arrangements Improved stabilization in mood, thinking, and/or behavior Need for constant or close observation no longer present Safe-care adequate arrangements made  PRELIMINARY DISCHARGE PLAN: Attend aftercare/continuing care group Attend PHP/IOP Attend 12-step recovery group Outpatient therapy Participate in family therapy Return to previous living arrangement  PATIENT/FAMIILY INVOLVEMENT: This treatment plan has been presented to and reviewed with the patient, [name], and/or family member.  The patient and family have been given the opportunity to ask questions and make suggestions.  Angeline Slim M 12/16/2012, 10:11 PM
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
## 2012-12-16 NOTE — ED Notes (Signed)
Pt discharged with sheriff to [facility]. No acute distress noted. Recent vitals WDL.
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
## 2012-12-16 NOTE — ED Notes (Signed)
Pt in room having vitals signs performed. Pt is currently denying any SI/HI/AVH. Pt is currently having pancreatic pain at a level 8/10. 600 mg of Ibuprofen administered. Pt informed of transfer to [facility] once IVC papers are served. Report called to Angeline Slim, RN at [facility].

## 2012-12-17 ENCOUNTER — Encounter (HOSPITAL_COMMUNITY): Payer: Self-pay | Admitting: Behavioral Health

## 2012-12-17 DIAGNOSIS — F1020 Alcohol dependence, uncomplicated: Secondary | ICD-10-CM | POA: Diagnosis present

## 2012-12-17 DIAGNOSIS — F1994 Other psychoactive substance use, unspecified with psychoactive substance-induced mood disorder: Secondary | ICD-10-CM

## 2012-12-17 DIAGNOSIS — F1410 Cocaine abuse, uncomplicated: Secondary | ICD-10-CM | POA: Diagnosis present

## 2012-12-17 DIAGNOSIS — F329 Major depressive disorder, single episode, unspecified: Principal | ICD-10-CM

## 2012-12-17 LAB — GLUCOSE, CAPILLARY
Glucose-Capillary: 135 mg/dL — ABNORMAL HIGH (ref 70–99)
Glucose-Capillary: 118 mg/dL — ABNORMAL HIGH (ref 70–99)

## 2012-12-17 LAB — BASIC METABOLIC PANEL
Chloride: 98 mEq/L (ref 96–112)
Creatinine, Ser: 0.65 mg/dL (ref 0.50–1.10)
GFR calc Af Amer: >90 mL/min (ref >90)
Potassium: 3.6 mEq/L (ref 3.5–5.1)

## 2012-12-17 MED ORDER — SERTRALINE HCL 25 MG PO TABS
25.0000 mg | ORAL_TABLET | Freq: Every day | ORAL | Status: DC
  Administered 2012-12-17 – 2012-12-18 (×2): 25 mg via ORAL
  Filled 2012-12-17 (×4): qty 1

## 2012-12-17 NOTE — Progress Notes (Signed)
Patient ID: [name], female   DOB: 05-27-1965, 47 y.o.   MRN: [id number] She has been up and to groups interacting with peers and staff/ Stated that she better today. Requested and received prn for constipation.
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
## 2012-12-17 NOTE — Progress Notes (Addendum)
47 y/o female who presents involuntarily for OD on 20 Ambien and 4 shots of alcohol.  Patient states this was not a SI attempt.  Patient states she got into an argument with her husband and told him she was going to do it.  Patient states she does not plan on being her long because she states she did not try to OD.  Patient states she has DM and bouts of Pancreatitis.  Patient denies drinking heavily currently but states she has in the past.  Patient states she drinks 1-2 beers daily.  Patient acts evasive when questions are asked and does not appear vested.  Patient states she has legal issues dating back to 21.  Patient states has a court date coming up for communicating threats recently.  Patient states she doesn't know when her court date is.   Patient states she does not need to be here.  Patient denies SI/HI and denies AVH.  Patient skin searched and patient has no skin problems.  Patient belongings secured in [address].  Consents obtained, fall safety plan completed and patient verbalized understanding.  Patient escorted and oriented to the unit by Clinical research associate.  Patient offered not additional questions or concerns.
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
## 2012-12-17 NOTE — Progress Notes (Signed)
Adult Psychoeducational Group Note  Date:  12/17/2012 Time:  10:17 AM  Group Topic/Focus:  Alcholics Anonymous  Participation Level:  Active  Participation Quality:  Appropriate  Affect:  Appropriate  Cognitive:  Appropriate  Insight: Appropriate  Engagement in Group:  Engaged  Modes of Intervention:  Discussion  Additional Comments:  Pt attended AA group with counselor. Pt was engaged and appropriate.  [place] Shi K 12/17/2012, 10:17 AM

## 2012-12-17 NOTE — Progress Notes (Signed)
Recreation Therapy Notes  Date: 08.28.2014 Time: 3:00pm Location: [address] Dayroom  Group Topic: Leisure Education  Goal Area(s) Addresses:  Patient will verbalize impact of positive leisure on sobriety. Patient will verbalize impact of leisure on self-esteem.  Behavioral Response: Engaged, Attentive, Appropriate,   Intervention: Air traffic controller  Activity: Leisure Time Management. Patients were given an worksheet outlining approximately 16 hours of the day, using various colors patients were asked to identify how they use their time. For example red = time at work, blue = time for self-care, green = time for leisure  Education: [employer], Discharge Planning  Education Outcome: Acknowledges understanding  Clinical Observations/Feedback: Patient actively participated in group session. Patient made no contributions to opening discussion, but appeared to actively listen as she maintained appropriate eye contact with speaker. Patient completed worksheet as requested. Patient shared that she is disappointed with her use of time. Patient stated she did not realize how much of her life was wasted on getting high until doing this activity. Patient expressed a desire to change her habits. Patient contributed to wrap up discussion, addressing the impact of leisure on self-esteem as well as the impact of leisure on sobriety.   Marykay Lex Dymin Dingledine, LRT/CTRS  Jearl Klinefelter 12/17/2012 4:41 PM

## 2012-12-17 NOTE — Progress Notes (Signed)
The focus of this group is to educate the patient on the purpose and policies of crisis stabilization and provide a format to answer questions about their admission.  The group details unit policies and expectations of patients while admitted.  Patient attended and actively participated in the group this morning.  She was engaged and respectful of others.

## 2012-12-17 NOTE — [facility] Suicide Risk Assessment (Signed)
[facility] INPATIENT: Family/Significant Other Suicide Prevention Education  Suicide Prevention Education:  Education Completed; No one has been identified by the patient as the family member/significant other with whom the patient will be residing, and identified as the person(s) who will aid the patient in the event of a mental health crisis (suicidal ideations/suicide attempt).   Pt did not c/o SI at admission, nor have they endorsed SI during their stay here. SPE not required. SPI pamphlet provided to pt and she was encouraged to share with support system.   [employer], LCSWA 12/17/2012 10:19 AM

## 2012-12-17 NOTE — Progress Notes (Signed)
D   Pt is in bed resting and does not appear to be in any distress   A   Will continue Q 15 min checks and provide medications and verbal support if needed R   Pt safe at present

## 2012-12-18 DIAGNOSIS — F411 Generalized anxiety disorder: Secondary | ICD-10-CM

## 2012-12-18 LAB — GLUCOSE, CAPILLARY
Glucose-Capillary: 112 mg/dL — ABNORMAL HIGH (ref 70–99)
Glucose-Capillary: 137 mg/dL — ABNORMAL HIGH (ref 70–99)

## 2012-12-18 MED ORDER — MAGNESIUM CITRATE PO SOLN
1.0000 | Freq: Once | ORAL | Status: AC
  Administered 2012-12-18: 1 via ORAL

## 2012-12-18 NOTE — [facility] Group Notes (Signed)
[facility] LCSW Aftercare Discharge Planning Group Note   12/18/2012 9:49 AM  Participation Quality:  Appropriate  Mood/Affect:  Irritable  Depression Rating:  2  Anxiety Rating:  2  Thoughts of Suicide:  No Will you contract for safety?   NA  Current AVH:  No  Plan for Discharge/Comments:  Pt worried that she is missing probation hearing this morning. CSW to fax letter to [facility] office today. Pt reports feeling good and is hoping to d/c today. CSW to attempt to contact husband for collateral information. Pt minimizing addiction issues.   Transportation Means: husband  Supports: husband/some family  Smart, Research scientist (physical sciences)

## 2012-12-18 NOTE — Progress Notes (Signed)
Adult Psychoeducational Group Note  Date:  12/18/2012 Time:  1:05 PM  Group Topic/Focus:  Relapse Prevention Planning:   The focus of this group is to define relapse and discuss the need for planning to combat relapse.  Participation Level:  Minimal  Participation Quality:  Resistant  Affect:  Appropriate  Cognitive:  Appropriate  Insight: Limited  Engagement in Group:  None  Modes of Intervention:  Activity and Discussion  Additional Comments:  Pt where taught how to write SMART goals in relation to relapse prevention and recovery.    Roanna Banning, [place] N 12/18/2012, 1:05 PM

## 2012-12-18 NOTE — Progress Notes (Signed)
Patient ID: CODA FILLER, female   DOB: 1965-12-08, 47 y.o.   MRN: [id number]  D: Patient pleasant and cooperative with care and interacting well with peers on unit. Pt animated on approach.  A: Q 15 minute safety checks, encourage group attendance and peer interaction. Administer medications as ordered. R: Pt denies SI/HI or plans to harm herself at this time. No s/s of distress noted. Pt participated in group session.

## 2012-12-18 NOTE — Progress Notes (Signed)
[facility] MD Progress Note  12/18/2012 3:26 PM [name]  MRN:  [id number] Subjective:  States that she needed to be out of here this AM as she had to be at her Probation's officer. She is still minimizing her active use of alcohol and cocaine. We have not been able to get in touch with her husband to get collateral information. She states she is going to miss her grandkid first birthday. Also states that she has other court dates she has to keep next week. She also minimizes her suicidal attempt. States she did not take all those pills and that she would never hurt herself because of her grand kids Diagnosis:   DSM5: Schizophrenia Disorders:   Obsessive-Compulsive Disorders:   Trauma-Stressor Disorders:   Substance/Addictive Disorders:  Alcohol Related Disorder - Severe (303.90), Cocaine Abuse Depressive Disorders:    Axis I: Anxiety Disorder NOS and Depressive Disorder NOS  ADL's:  Intact  Sleep: Fair  Appetite:  Fair  Suicidal Ideation:  Plan:  denies Intent:  denies Means:  denies Homicidal Ideation:  Plan:  denies Intent:  denies Means:  denies AEB (as evidenced by):  Psychiatric Specialty Exam: Review of Systems  Constitutional: Negative.   HENT: Negative.   Eyes: Negative.   Respiratory: Negative.   Cardiovascular: Negative.   Gastrointestinal: Negative.   Genitourinary: Negative.   Musculoskeletal: Negative.   Skin: Negative.   Neurological: Negative.   Endo/Heme/Allergies: Negative.   Psychiatric/Behavioral: Positive for depression and substance abuse. The patient is nervous/anxious.     Blood pressure 96/74, pulse 80, temperature 97.5 F (36.4 C), temperature source Oral, resp. rate 20, height 5' 2.6" (1.59 m), weight 58.968 kg (130 lb), last menstrual period 11/01/2011, SpO2 99.00%.Body mass index is 23.33 kg/(m^2).  General Appearance: Fairly Groomed  Patent attorney::  Fair  Speech:  Clear and Coherent and Slow  Volume:  fluctuates  Mood:  Anxious and  worried  Affect:  Restricted and Tearful  Thought Process:  Coherent and Goal Directed  Orientation:  Full (Time, Place, and Person)  Thought Content:  worries, concerns  Suicidal Thoughts:  No  Homicidal Thoughts:  No  Memory:  Immediate;   Fair Recent;   Fair Remote;   Fair  Judgement:  Fair  Insight:  superficial  Psychomotor Activity:  Restlessness  Concentration:  Fair  Recall:  Fair  Akathisia:  No  Handed:  Right  AIMS (if indicated):     Assets:  Desire for Improvement  Sleep:  Number of Hours: 5.5   Current Medications: Current Facility-Administered Medications  Medication Dose Route Frequency Provider Last Rate Last Dose  . acetaminophen (TYLENOL) tablet 650 mg  650 mg Oral Q6H PRN Kerry Hough, PA-C      . alum & mag hydroxide-simeth (MAALOX/MYLANTA) 200-200-20 MG/5ML suspension 30 mL  30 mL Oral Q4H PRN Kerry Hough, PA-C      . chlordiazePOXIDE (LIBRIUM) capsule 25 mg  25 mg Oral Q6H PRN Kerry Hough, PA-C      . hydrOXYzine (ATARAX/VISTARIL) tablet 25 mg  25 mg Oral Q6H PRN Kerry Hough, PA-C      . linagliptin (TRADJENTA) tablet 5 mg  5 mg Oral Daily Kerry Hough, PA-C   5 mg at 12/18/12 0865  . loperamide (IMODIUM) capsule 2-4 mg  2-4 mg Oral PRN Kerry Hough, PA-C      . magnesium hydroxide (MILK OF MAGNESIA) suspension 30 mL  30 mL Oral Daily PRN Kerry Hough, PA-C  30 mL at 12/18/12 0803  . metFORMIN (GLUCOPHAGE) tablet 500 mg  500 mg Oral BID WC Kerry Hough, PA-C   500 mg at 12/18/12 1610  . multivitamin with minerals tablet 1 tablet  1 tablet Oral Daily Kerry Hough, PA-C   1 tablet at 12/18/12 9604  . ondansetron (ZOFRAN-ODT) disintegrating tablet 4 mg  4 mg Oral Q6H PRN Kerry Hough, PA-C      . pantoprazole (PROTONIX) EC tablet 40 mg  40 mg Oral Daily Kerry Hough, PA-C   40 mg at 12/18/12 0800  . potassium chloride SA (K-DUR,KLOR-CON) CR tablet 20 mEq  20 mEq Oral BID Kerry Hough, PA-C   20 mEq at 12/18/12 5409  .  sertraline (ZOLOFT) tablet 25 mg  25 mg Oral QHS Rachael Fee, MD   25 mg at 12/17/12 2152  . thiamine (B-1) injection 100 mg  100 mg Intramuscular Once [employer], PA-C      . thiamine (VITAMIN B-1) tablet 100 mg  100 mg Oral Daily Kerry Hough, PA-C   100 mg at 12/18/12 0803  . traZODone (DESYREL) tablet 50 mg  50 mg Oral QHS,MR X 1 Kerry Hough, PA-C   50 mg at 12/17/12 2153    Lab Results:  Results for orders placed during the hospital encounter of 12/16/12 (from the past 48 hour(s))  GLUCOSE, CAPILLARY     Status: Abnormal   Collection Time    12/16/12 10:11 PM      Result Value Range   Glucose-Capillary 100 (*) 70 - 99 mg/dL   Comment 1 Notify RN    BASIC METABOLIC PANEL     Status: Abnormal   Collection Time    12/17/12  6:10 AM      Result Value Range   Sodium 134 (*) 135 - 145 mEq/L   Potassium 3.6  3.5 - 5.1 mEq/L   Chloride 98  96 - 112 mEq/L   CO2 28  19 - 32 mEq/L   Glucose, Bld 125 (*) 70 - 99 mg/dL   BUN 14  6 - 23 mg/dL   Creatinine, Ser 8.11  0.50 - 1.10 mg/dL   Calcium 9.7  8.4 - 91.4 mg/dL   GFR calc non Af Amer >90  >90 mL/min   GFR calc Af Amer >90  >90 mL/min   Comment: (NOTE)     The eGFR has been calculated using the CKD EPI equation.     This calculation has not been validated in all clinical situations.     eGFR's persistently <90 mL/min signify possible Chronic Kidney     Disease.     Performed at [facility]  GLUCOSE, CAPILLARY     Status: Abnormal   Collection Time    12/17/12  6:20 AM      Result Value Range   Glucose-Capillary 135 (*) 70 - 99 mg/dL  GLUCOSE, CAPILLARY     Status: Abnormal   Collection Time    12/17/12  5:07 PM      Result Value Range   Glucose-Capillary 118 (*) 70 - 99 mg/dL   Comment 1 Notify RN    GLUCOSE, CAPILLARY     Status: Abnormal   Collection Time    12/18/12  6:14 AM      Result Value Range   Glucose-Capillary 112 (*) 70 - 99 mg/dL    Physical Findings: AIMS: Facial and Oral  Movements Muscles of Facial Expression:  None, normal Lips and Perioral Area: None, normal Jaw: None, normal Tongue: None, normal,Extremity Movements Upper (arms, wrists, hands, fingers): None, normal Lower (legs, knees, ankles, toes): None, normal, Trunk Movements Neck, shoulders, hips: None, normal, Overall Severity Severity of abnormal movements (highest score from questions above): None, normal Incapacitation due to abnormal movements: None, normal Patient's awareness of abnormal movements (rate only patient's report): No Awareness, Dental Status Current problems with teeth and/or dentures?: No Does patient usually wear dentures?: No  CIWA:  CIWA-Ar Total: 0 COWS:     Treatment Plan Summary: Daily contact with patient to assess and evaluate symptoms and progress in treatment Medication management  Plan:Supportive approach/coping skills/relapse prevention           Address the co morbidities           Will still need collateral information to be able to assess for safety and readiness for D/C  Medical Decision Making Problem Points:  Review of psycho-social stressors (1) Data Points:  Review of medication regiment & side effects (2)  I certify that inpatient services furnished can reasonably be expected to improve the patient's condition.   Ozil Stettler A 12/18/2012, 3:26 PM
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
## 2012-12-18 NOTE — Progress Notes (Signed)
Adult Psychoeducational Group Note  Date:  12/18/2012 Time:  10:06 PM  Group Topic/Focus:  AA group  Participation Level:  Active  Participation Quality:  Appropriate  Affect:  Appropriate  Cognitive:  Appropriate  Insight: Appropriate  Engagement in Group:  Engaged  Modes of Intervention:  Discussion  Additional Comments:    Flonnie Hailstone 12/18/2012, 10:06 PM

## 2012-12-18 NOTE — Progress Notes (Signed)
D:  [name] reports that she slept well and her energy level is good.  She is rating her depression at 2/10 and hopelessness at 1/10.  She complains of constipation and was given 1 bottle of mag citrate to help alleviate this.  She is attending group and is interacting appropriately with staff and other patients.  She was tearful this afternoon because she was hoping to go home, but she calmed down significantly. A:  Safety checks q 15 minutes.  Emotional support provided.  Medications administered as ordered. R:  Safety maintained on unit.

## 2012-12-18 NOTE — Tx Team (Signed)
Interdisciplinary Treatment Plan Update (Adult)  Date: 12/18/2012   Time Reviewed: 11:02 AM  Progress in Treatment:  Attending groups: Yes Participating in groups:  Yes Taking medication as prescribed: Yes  Tolerating medication: Yes  Family/Significant othe contact made: No. Per MD, CSW to attempt contact with pt's husband due to events causing IVC hospitalization.  Patient understands diagnosis: No. Pt minimizes substance abuse issues and vehemently denies that she was attempting suicide prior to admission.  Discussing patient identified problems/goals with staff: Yes  Medical problems stabilized or resolved: Yes  Denies suicidal/homicidal ideation: Yes during admission, group, and self report.  Patient has not harmed self or Others: Yes  New problem(s) identified: pt reports that she had probation hearing this morning at 9:30AM. CSW to fax letter stating that pt is at [facility] and unable to attend.  Discharge Plan or Barriers: [facility]/SA IOP.  Additional comments: N/A  Reason for Continuation of Hospitalization: Mood stabilization Medication management Estimated length of stay: 2-3 days (likely d/c Monday) For review of initial/current patient goals, please see plan of care.  Attendees:  Patient:    Family:    Physician: Geoffery Lyons MD 12/18/2012 11:02 AM   Nursing: Philippa Chester RN 12/18/2012 11:02 AM   Clinical Social Worker [employer], LCSWA  12/18/2012 11:02 AM   Other: Aggie PA 12/18/2012 11:02 AM   Other:    Other: Darden Dates Nurse CM  12/18/2012 11:02 AM   Other:    Scribe for Treatment Team:  [employer] LCSWA 12/18/2012 11:02 AM

## 2012-12-18 NOTE — Clinical Social Work Note (Signed)
CSW made several attempts to reach pt's husband today in order to gain collateral information regarding the pt's ability to d/c and return home. Per MD, unless pt's husband is reached and he verifies that he she is able to return home safely with him, pt must stay through the weekend. CSW left voicemail on husband's cell phone asking him to call back. Pt's followup is in place.

## 2012-12-19 DIAGNOSIS — F1910 Other psychoactive substance abuse, uncomplicated: Secondary | ICD-10-CM

## 2012-12-19 DIAGNOSIS — F1010 Alcohol abuse, uncomplicated: Secondary | ICD-10-CM

## 2012-12-19 LAB — GLUCOSE, CAPILLARY: Glucose-Capillary: 140 mg/dL — ABNORMAL HIGH (ref 70–99)

## 2012-12-19 MED ORDER — TRAZODONE HCL 50 MG PO TABS: 50.0000 mg | ORAL_TABLET | Freq: Every evening | ORAL | Status: DC | PRN

## 2012-12-19 MED ORDER — SERTRALINE HCL 25 MG PO TABS: 25.0000 mg | ORAL_TABLET | Freq: Every day | ORAL | Status: DC

## 2012-12-19 MED ORDER — HYDROXYZINE HCL 25 MG PO TABS: 25.0000 mg | ORAL_TABLET | Freq: Four times a day (QID) | ORAL | Status: DC | PRN

## 2012-12-19 NOTE — Progress Notes (Signed)
Psychoeducational Group Note  Date:  12/19/2012 Time:  0945 am  Group Topic/Focus:  Identifying Needs:   The focus of this group is to help patients identify their personal needs that have been historically problematic and identify healthy behaviors to address their needs.  Participation Level:  Active  Participation Quality:  Appropriate  Affect:  Appropriate  Cognitive:  Alert and Appropriate  Insight:  Improving  Engagement in Group:  Improving  Additional Comments:    Andrena Mews 12/19/2012,3:52 PM

## 2012-12-19 NOTE — [facility] Suicide Risk Assessment (Signed)
Suicide Risk Assessment  Discharge Assessment     Demographic Factors:  Caucasian  Mental Status Per Nursing Assessment::   On Admission:   expressed SI thoughts before admission  Current Mental Status by Physician: Logical, no si, no hi, no avh, sad mood  Loss Factors: Decrease in vocational status Legal issues Historical Factors: Impulsivity  Risk Reduction Factors:   Positive social support  Continued Clinical Symptoms:  Alcohol/Substance Abuse/Dependencies  Cognitive Features That Contribute To Risk:  Closed-mindedness    Suicide Risk:  Mild:  Suicidal ideation of limited frequency, intensity, duration, and specificity.  There are no identifiable plans, no associated intent, mild dysphoria and related symptoms, good self-control (both objective and subjective assessment), few other risk factors, and identifiable protective factors, including available and accessible social support.  Discharge Diagnoses:   AXIS I:  Substance Abuse AXIS II:  Deferred AXIS III:   Past Medical History  Diagnosis Date  . Impaired glucose tolerance 11/07/2010  . Hepatitis C approx dx 2005    no tx to date  . Pancreatitis     HX of  . Alcohol dependency   . GERD (gastroesophageal reflux disease)   . Chronic constipation   . Gallstones   . Drug dependency     Hx of cocaine use  . Depression   . Rheumatoid factor positive   . Hemochromatosis     last phlebotomy tx 2-3 yrs  . Pancytopenia   . Abnormal vaginal bleeding     uterine fibroid  . Gallstone 11/09/2010  . Chronic abdominal pain 11/09/2010  . Cirrhosis 11/09/2010  . Type II or unspecified type diabetes mellitus without mention of complication, uncontrolled 09/17/2011  . Anxiety 09/17/2011   AXIS IV:  other psychosocial or environmental problems AXIS V:  51-60 moderate symptoms  Plan Of Care/Follow-up recommendations:  Activity:  as tolerated  Is patient on multiple antipsychotic therapies at discharge:  No   Has  Patient had three or more failed trials of antipsychotic monotherapy by history:  No  Recommended Plan for Multiple Antipsychotic Therapies: NA  Kerra Guilfoil 12/19/2012, 1:34 PM

## 2012-12-19 NOTE — Progress Notes (Signed)
[facility] Adult Case Management Discharge Plan :  Will you be returning to the same living situation after discharge: Yes,  husband has confirmed she can come home' At discharge, do you have transportation home?:Yes,  husband will pick up Do you have the ability to pay for your medications:Yes,  has insurance  Release of information consent forms completed and in the chart;  Patient's signature needed at discharge.  Patient to Follow up at: Follow-up Information   Follow up with [facility]. (Call Jeann[facility] at [phone] to schedule hospital followup/assessment for SA IOP. He will need to speak with you at discharge in order to readmit you into program. )    Contact information:   [address] [place], [place] [address] Phone: [phone] Fax: [phone]      Follow up with [facility] On 12/22/2012. (Appt at 10:15AM with Dr. Jonny Ruiz for hospital followup. )    Contact information:   [address] [place], [place] [address] Phone: [phone] Fax: [phone]      Patient denies SI/HI:   Yes,  denies all    Safety Planning and Suicide Prevention discussed:  Yes,  documented by weekday CSW  Sarina Ser 12/19/2012, 12:32 PM

## 2012-12-19 NOTE — Progress Notes (Signed)
Pt is awake and alert, pleasant and cooperative. Patient denies HI, SI AH or VH. Discharge vitals 110/71 HR 88 RR 16 and unlabored. Pt has outpatient treatment scheduled at [facility] . Will continue to monitor for safety. Patient escorted to lobby without incident. T.Melvyn Neth RN

## 2012-12-19 NOTE — Progress Notes (Signed)
Patient ID: [name], female   DOB: [birth year], 47 y.o.   MRN: [id number] Pt is awake and alert, denies HI,SI, VH or AH. Pt is medication compliant. Interacts well with staff and others. Pt c/o pressure in her abdominal/ pelvic area and states "this may be a UTI" advised pt to speak with MD for further evaluation. No other symptoms reported/noted. Pt is pleasant and cooperative,pt is excited about discharge. support and encouragement offered. Will continue to monitor for safety.

## 2012-12-19 NOTE — [facility] Group Notes (Signed)
[facility] Group Notes:  (Nursing/MHT/Case Management/Adjunct)  Date:  12/19/2012  Time:  1:19 PM  Type of Therapy:  Psychoeducational Skills  Participation Level:  Active  Participation Quality:  Appropriate  Affect:  Appropriate  Cognitive:  Appropriate  Insight:  Appropriate  Engagement in Group:  Engaged  Modes of Intervention:  Problem-solving  Summary of Progress/Problems: Pt attended self inventory group, and engaged in treatment. Pt reported in group that she was ready for discharge and that as soon as her husband agrees to her discharge the doctor will let her go home.   Jacquelyne Balint Shanta 12/19/2012, 1:19 PM
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
## 2012-12-19 NOTE — Clinical Social Work Note (Signed)
Clinical Social Work Note  CSW called, spoke with Madelyn Brunner, patient's husband, [phone].  He confirmed that he feels patient is doing well and is able to discharge home.  He will come pick her up between 2pm and 3pm.  He feels that she is bored because of not working right now, and he is not present due to working double shifts.  He wants to get her involved in more AA meetings and other activities.  Ambrose Mantle, LCSW 12/19/2012, 12:31 PM  '

## 2012-12-22 ENCOUNTER — Ambulatory Visit: Payer: Self-pay | Admitting: Internal Medicine

## 2012-12-22 DIAGNOSIS — Z0289 Encounter for other administrative examinations: Secondary | ICD-10-CM

## 2012-12-23 NOTE — Progress Notes (Addendum)
Patient Discharge Instructions:  After Visit Summary (AVS):   Faxed to:  11/22/12 Psychiatric Admission Assessment Note:   Faxed to:  11/22/12 Suicide Risk Assessment - Discharge Assessment:   Faxed to:  11/22/12 Faxed/Sent to the Next Level Care provider:  11/22/12 Next Level Care Provider Has Access to the EMR, 11/22/12 Faxed to [facility] @ [phone] Records provided to [facility] via [facility]/Epic access.  Jerelene Redden, 12/23/2012, 4:20 PM

## 2012-12-25 NOTE — Discharge Summary (Signed)
Physician Discharge Summary Note  Patient:  [name] is an 47 y.o., female MRN:  [id number] DOB:  21-Aug-1965 Patient phone:  [phone] (home)  Patient address:   [address] [place] [address],   Date of Admission:  12/16/2012 Date of Discharge: 12/19/2012  Reason for Admission:  :46 Y/O female who endorses on going conflict with her husband. States her husband was drinking, they got ina argument he told her to take the whole bottle of pills. She says she took around 10 (Ambien 10 mg) but emptied the bottle to make him think she had taken the whole amount. He called 911. States she has a lot of things going on. Has three court dates for 1994 (DWI), 1998(Driving with revoked license) , another charge about "this girl "(communiating treats) She is on probation DWI. (UDS pos for cocaine, alcohol level: 216. ( She tends to minimize and not be forthcoming about her use)  Discharge Diagnoses: Active Problems:   DEPRESSION   Anxiety   Alcohol dependence   Cocaine abuse  Review of Systems  Constitutional: Negative.   HENT: Negative.   Eyes: Negative.   Respiratory: Negative.   Cardiovascular: Negative.   Gastrointestinal: Negative.   Genitourinary: Negative.   Musculoskeletal: Negative.   Neurological: Negative.   Endo/Heme/Allergies: Negative.   Psychiatric/Behavioral: Negative for suicidal ideas and hallucinations.    DSM5:  Schizophrenia Disorders:   Obsessive-Compulsive Disorders:   Trauma-Stressor Disorders:   Substance/Addictive Disorders:  Alcohol Related Disorder - Severe (303.90), Cocaine abuse  Depressive Disorders:    Axis Diagnosis:   AXIS I:  Alcohol Abuse, Anxiety Disorder NOS, Depressive Disorder NOS, Substance Abuse and Substance Induced Mood Disorder AXIS II:  Deferred AXIS III:   Past Medical History  Diagnosis Date  . Impaired glucose tolerance 11/07/2010  . Hepatitis C approx dx 2005    no tx to date  . Pancreatitis     HX of  .  Alcohol dependency   . GERD (gastroesophageal reflux disease)   . Chronic constipation   . Gallstones   . Drug dependency     Hx of cocaine use  . Depression   . Rheumatoid factor positive   . Hemochromatosis     last phlebotomy tx 2-3 yrs  . Pancytopenia   . Abnormal vaginal bleeding     uterine fibroid  . Gallstone 11/09/2010  . Chronic abdominal pain 11/09/2010  . Cirrhosis 11/09/2010  . Type II or unspecified type diabetes mellitus without mention of complication, uncontrolled 09/17/2011  . Anxiety 09/17/2011   AXIS IV:  economic problems, other psychosocial or environmental problems, problems related to legal system/crime and problems with primary support group AXIS V:  51-60 moderate symptoms  Level of Care:  OP  Hospital Course:  The patient was admitted to the unit and she was oriented to the facility and the schedule. Her detox needs were assessed. Her Prozac was discontinued and she was started on Zoloft. Her outpatient medications were continued. She was provided supportive care. She attended group therapy sessions as scheduled. Social workers and dressed her followup needs and made the appropriate followup appointments. She was to followup at [facility] for continued substance abuse and behavioral medicine treatment.  Consults:  None  Significant Diagnostic Studies:  labs:    Discharge Vitals:   Blood pressure 110/71, pulse 70, temperature 98.2 F (36.8 C), temperature source Oral, resp. rate 17, height 5' 2.6" (1.59 m), weight 130 lb (58.968 kg), last menstrual period  11/01/2011, SpO2 99.00%. Body mass index is 23.33 kg/(m^2). Lab Results:   No results found for this or any previous visit (from the past 72 hour(s)).  Physical Findings: AIMS: Facial and Oral Movements Muscles of Facial Expression: None, normal Lips and Perioral Area: None, normal Jaw: None, normal Tongue: None, normal,Extremity Movements Upper (arms, wrists, hands, fingers): None,  normal Lower (legs, knees, ankles, toes): None, normal, Trunk Movements Neck, shoulders, hips: None, normal, Overall Severity Severity of abnormal movements (highest score from questions above): None, normal Incapacitation due to abnormal movements: None, normal Patient's awareness of abnormal movements (rate only patient's report): No Awareness, Dental Status Current problems with teeth and/or dentures?: No Does patient usually wear dentures?: No  CIWA:  CIWA-Ar Total: 3 COWS:     Psychiatric Specialty Exam: See Psychiatric Specialty Exam and Suicide Risk Assessment completed by Attending Physician prior to discharge.  Discharge destination:  Home  Is patient on multiple antipsychotic therapies at discharge:  No   Has Patient had three or more failed trials of antipsychotic monotherapy by history:  No  Recommended Plan for Multiple Antipsychotic Therapies: NA     Medication List    STOP taking these medications       FLUoxetine 20 MG capsule  Commonly known as:  PROZAC     zolpidem 10 MG tablet  Commonly known as:  AMBIEN      TAKE these medications     Indication   esomeprazole 40 MG capsule  Commonly known as:  NEXIUM  Take 40 mg by mouth daily as needed (heart burn).      hydrOXYzine 25 MG tablet  Commonly known as:  ATARAX/VISTARIL  Take 1 tablet (25 mg total) by mouth every 6 (six) hours as needed for anxiety (or CIWA score </= 10).      insulin aspart 100 UNIT/ML injection  Commonly known as:  novoLOG  Inject 2 Units into the skin 3 (three) times daily with meals.      linagliptin 5 MG Tabs tablet  Commonly known as:  TRADJENTA  Take 5 mg by mouth daily.      metFORMIN 500 MG tablet  Commonly known as:  GLUCOPHAGE  Take 500 mg by mouth 2 (two) times daily with a meal.      multivitamin with minerals Tabs tablet  Take 1 tablet by mouth daily.      sertraline 25 MG tablet  Commonly known as:  ZOLOFT  Take 1 tablet (25 mg total) by mouth at bedtime.       traZODone 50 MG tablet  Commonly known as:  DESYREL  Take 1 tablet (50 mg total) by mouth at bedtime and may repeat dose one time if needed.            Follow-up Information   Follow up with [facility]. (Call Jeannett Senior [facility] at [phone] to schedule hospital followup/assessment for [facility]. He will need to speak with you at discharge in order to readmit you into program. )    Contact information:   [address] [place], [place] [address] Phone: [phone] Fax: [phone]      Follow up with [facility] On 12/22/2012. (Appt at 10:15AM with Dr. Jonny Ruiz for hospital followup. )    Contact information:   [address] [place], [place] [address] Phone: [phone] Fax: [phone]      Follow-up recommendations:    Comments:    Total Discharge Time:  Greater than 30 minutes.  SignedJorje Guild 12/25/2012,  8:40 AM

## 2013-01-13 ENCOUNTER — Encounter: Payer: Self-pay | Admitting: Internal Medicine

## 2013-01-13 ENCOUNTER — Ambulatory Visit (INDEPENDENT_AMBULATORY_CARE_PROVIDER_SITE_OTHER): Payer: BC Managed Care – PPO | Admitting: Internal Medicine

## 2013-01-13 ENCOUNTER — Other Ambulatory Visit (INDEPENDENT_AMBULATORY_CARE_PROVIDER_SITE_OTHER): Payer: BC Managed Care – PPO

## 2013-01-13 VITALS — BP 100/60 | HR 67 | Temp 97.3°F | Ht 62.0 in | Wt 139.2 lb

## 2013-01-13 DIAGNOSIS — B1710 Acute hepatitis C without hepatic coma: Secondary | ICD-10-CM

## 2013-01-13 DIAGNOSIS — F329 Major depressive disorder, single episode, unspecified: Secondary | ICD-10-CM

## 2013-01-13 DIAGNOSIS — Z23 Encounter for immunization: Secondary | ICD-10-CM

## 2013-01-13 DIAGNOSIS — J209 Acute bronchitis, unspecified: Secondary | ICD-10-CM

## 2013-01-13 LAB — HEPATIC FUNCTION PANEL
Alkaline Phosphatase: 79 U/L (ref 39–117)
Bilirubin, Direct: 0.4 mg/dL — ABNORMAL HIGH (ref 0.0–0.3)
Total Protein: 7.5 g/dL (ref 6.0–8.3)

## 2013-01-13 LAB — BASIC METABOLIC PANEL
CO2: 33 mEq/L — ABNORMAL HIGH (ref 19–32)
Calcium: 9.3 mg/dL (ref 8.4–10.5)
GFR: 101.82 mL/min (ref >60.00)
Sodium: 138 mEq/L (ref 135–145)

## 2013-01-13 LAB — LIPID PANEL
HDL: 33.5 mg/dL — ABNORMAL LOW (ref >39.00)
Total CHOL/HDL Ratio: 4

## 2013-01-13 MED ORDER — METFORMIN HCL 500 MG PO TABS: 500.0000 mg | ORAL_TABLET | Freq: Two times a day (BID) | ORAL | Status: DC

## 2013-01-13 MED ORDER — SERTRALINE HCL 100 MG PO TABS: 100.0000 mg | ORAL_TABLET | Freq: Every day | ORAL | Status: DC

## 2013-01-13 MED ORDER — BENZONATATE 100 MG PO CAPS: ORAL_CAPSULE | ORAL | Status: DC

## 2013-01-13 MED ORDER — AZITHROMYCIN 250 MG PO TABS: ORAL_TABLET | ORAL | Status: DC

## 2013-01-13 NOTE — Progress Notes (Signed)
Subjective:    Patient ID: [name], female    DOB: [birth year], 47 y.o.   MRN: [id number]  HPI  Here with acute onset mild to mod 2-3 days ST, HA, general weakness and malaise, with prod cough greenish sputum, but Pt denies chest pain, increased sob or doe, wheezing, orthopnea, PND, increased LE swelling, palpitations, dizziness or syncope. Pt denies new neurological symptoms such as new headache, or facial or extremity weakness or numbness  Pt denies polydipsia, polyuria, or low sugar symptoms such as weakness or confusion improved with po intake.  Pt states overall good compliance with meds, trying to follow lower cholesterol, diabetic diet, wt overall stable but little exercise however.     Due for Hep C referral.  Denies worsening depressive symptoms, suicidal ideation, or panic; has ongoing anxiety, with increased recently, on low dose zoloft.  Past Medical History  Diagnosis Date  . Impaired glucose tolerance 11/07/2010  . Hepatitis C approx dx 2005    no tx to date  . Pancreatitis     HX of  . Alcohol dependency   . GERD (gastroesophageal reflux disease)   . Chronic constipation   . Gallstones   . Drug dependency     Hx of cocaine use  . Depression   . Rheumatoid factor positive   . Hemochromatosis     last phlebotomy tx 2-3 yrs  . Pancytopenia   . Abnormal vaginal bleeding     uterine fibroid  . Gallstone 11/09/2010  . Chronic abdominal pain 11/09/2010  . Cirrhosis 11/09/2010  . Type II or unspecified type diabetes mellitus without mention of complication, uncontrolled 09/17/2011  . Anxiety 09/17/2011   Past Surgical History  Procedure Laterality Date  . Appendectomy    . Cesarean section  x 2  . Foot surgery  2013  . Abdominal hysterectomy  12/17/2011    Procedure: HYSTERECTOMY ABDOMINAL;  Surgeon: Miguel Aschoff, MD;  Location: [facility];  Service: Gynecology;  Laterality: N/A;  . Salpingoophorectomy  12/17/2011    Procedure: SALPINGO OOPHERECTOMY;  Surgeon: Miguel Aschoff, MD;   Location: [facility];  Service: Gynecology;  Laterality: Bilateral;    reports that she has quit smoking. She does not have any smokeless tobacco history on file. She reports that  drinks alcohol. She reports that she does not use illicit drugs. family history includes Cancer in her mother; Diabetes in her father. Allergies  Allergen Reactions  . Iodinated Diagnostic Agents Hives    Pt states allergic to Betadine.   Review of Systems  Constitutional: Negative for unexpected weight change, or unusual diaphoresis  HENT: Negative for tinnitus.   Eyes: Negative for photophobia and visual disturbance.  Respiratory: Negative for choking and stridor.   Gastrointestinal: Negative for vomiting and blood in stool.  Genitourinary: Negative for hematuria and decreased urine volume.  Musculoskeletal: Negative for acute joint swelling Skin: Negative for color change and wound.  Neurological: Negative for tremors and numbness other than noted  Psychiatric/Behavioral: Negative for decreased concentration or  hyperactivity.       Objective:   Physical Exam BP 100/60  Pulse 67  Temp(Src) 97.3 F (36.3 C) (Oral)  Ht 5\' 2"  (1.575 m)  Wt 139 lb 4 oz (63.163 kg)  BMI 25.46 kg/m2  SpO2 95%  LMP 11/01/2011 VS noted, mild ill Constitutional: Pt appears well-developed and well-nourished.  HENT: Head: NCAT.  Right Ear: External ear normal.  Left Ear: External ear normal.  Bilat tm's with mild erythema.  Max sinus  areas non tender.  Pharynx with mild erythema, no exudate Eyes: Conjunctivae and EOM are normal. Pupils are equal, round, and reactive to light.  Neck: Normal range of motion. Neck supple.  Cardiovascular: Normal rate and regular rhythm.   Pulmonary/Chest: Effort normal and breath sounds normal.  Neurological: Pt is alert. Not confused  Skin: Skin is warm. No erythema.  Psychiatric: Pt behavior is normal. Thought content normal. 2+ nervous       Assessment & Plan:

## 2013-01-13 NOTE — Patient Instructions (Addendum)
You had the flu shot today Please take all new medication as prescribed - the antibiotic, and cough medicine Please continue all other medications as before, and refills have been done if requested. You will be contacted regarding the referral for: Hep C clinic OK to increase the zoloft to 100 mg per day Please keep your appointments with your specialists as you have planned Please go to the LAB in the Basement (turn left off the elevator) for the tests to be done today You will be contacted by phone if any changes need to be made immediately.  Otherwise, you will receive a letter about your results with an explanation, but please check with MyChart first.  Please remember to sign up for My Chart if you have not done so, as this will be important to you in the future with finding out test results, communicating by private email, and scheduling acute appointments online when needed.  Please return in 3 months, or sooner if needed

## 2013-01-15 ENCOUNTER — Other Ambulatory Visit (HOSPITAL_COMMUNITY): Payer: Self-pay | Admitting: Physician Assistant

## 2013-01-17 NOTE — Assessment & Plan Note (Signed)
stable overall by history and exam, recent data reviewed with pt, and pt to continue medical treatment as before,  to f/u any worsening symptoms or concerns Lab Results  Component Value Date   HGBA1C 7.1* 01/13/2013

## 2013-01-17 NOTE — Assessment & Plan Note (Signed)
Ok for increased zoloft to 100 qd,  to f/u any worsening symptoms or concerns

## 2013-01-17 NOTE — Assessment & Plan Note (Signed)
Mild to mod, for antibx course,  to f/u any worsening symptoms or concerns 

## 2013-01-17 NOTE — Assessment & Plan Note (Signed)
For hep C clinic referral,  to f/u any worsening symptoms or concerns

## 2013-01-21 ENCOUNTER — Other Ambulatory Visit: Payer: Self-pay | Admitting: Internal Medicine

## 2013-01-21 ENCOUNTER — Other Ambulatory Visit (HOSPITAL_COMMUNITY): Payer: Self-pay | Admitting: Physician Assistant

## 2013-01-22 NOTE — Telephone Encounter (Signed)
Faxed hardcopy to [facility] [address] [place] 

## 2013-01-22 NOTE — Telephone Encounter (Signed)
Done hardcopy to [facility]  

## 2013-01-23 ENCOUNTER — Encounter (HOSPITAL_COMMUNITY): Payer: Self-pay | Admitting: *Deleted

## 2013-01-23 ENCOUNTER — Emergency Department (HOSPITAL_COMMUNITY)
Admission: EM | Admit: 2013-01-23 | Discharge: 2013-01-24 | Disposition: A | Discharge status: Home / Self Care | Payer: BC Managed Care – PPO | Attending: Emergency Medicine | Admitting: Emergency Medicine

## 2013-01-23 DIAGNOSIS — J029 Acute pharyngitis, unspecified: Secondary | ICD-10-CM | POA: Insufficient documentation

## 2013-01-23 DIAGNOSIS — Z794 Long term (current) use of insulin: Secondary | ICD-10-CM | POA: Insufficient documentation

## 2013-01-23 DIAGNOSIS — R05 Cough: Secondary | ICD-10-CM | POA: Insufficient documentation

## 2013-01-23 DIAGNOSIS — F411 Generalized anxiety disorder: Secondary | ICD-10-CM | POA: Insufficient documentation

## 2013-01-23 DIAGNOSIS — K219 Gastro-esophageal reflux disease without esophagitis: Secondary | ICD-10-CM | POA: Insufficient documentation

## 2013-01-23 DIAGNOSIS — Z8619 Personal history of other infectious and parasitic diseases: Secondary | ICD-10-CM | POA: Insufficient documentation

## 2013-01-23 DIAGNOSIS — F3289 Other specified depressive episodes: Secondary | ICD-10-CM | POA: Insufficient documentation

## 2013-01-23 DIAGNOSIS — Z79899 Other long term (current) drug therapy: Secondary | ICD-10-CM | POA: Insufficient documentation

## 2013-01-23 DIAGNOSIS — Z862 Personal history of diseases of the blood and blood-forming organs and certain disorders involving the immune mechanism: Secondary | ICD-10-CM | POA: Insufficient documentation

## 2013-01-23 DIAGNOSIS — R059 Cough, unspecified: Secondary | ICD-10-CM | POA: Insufficient documentation

## 2013-01-23 DIAGNOSIS — G8929 Other chronic pain: Secondary | ICD-10-CM | POA: Insufficient documentation

## 2013-01-23 DIAGNOSIS — IMO0001 Reserved for inherently not codable concepts without codable children: Secondary | ICD-10-CM | POA: Insufficient documentation

## 2013-01-23 DIAGNOSIS — Z8639 Personal history of other endocrine, nutritional and metabolic disease: Secondary | ICD-10-CM | POA: Insufficient documentation

## 2013-01-23 DIAGNOSIS — F329 Major depressive disorder, single episode, unspecified: Secondary | ICD-10-CM | POA: Insufficient documentation

## 2013-01-23 DIAGNOSIS — J3489 Other specified disorders of nose and nasal sinuses: Secondary | ICD-10-CM | POA: Insufficient documentation

## 2013-01-23 DIAGNOSIS — D61818 Other pancytopenia: Secondary | ICD-10-CM | POA: Insufficient documentation

## 2013-01-23 DIAGNOSIS — Z87891 Personal history of nicotine dependence: Secondary | ICD-10-CM | POA: Insufficient documentation

## 2013-01-23 DIAGNOSIS — Z792 Long term (current) use of antibiotics: Secondary | ICD-10-CM | POA: Insufficient documentation

## 2013-01-23 MED ORDER — IBUPROFEN 400 MG PO TABS
600.0000 mg | ORAL_TABLET | Freq: Once | ORAL | Status: AC
  Administered 2013-01-24: 600 mg via ORAL
  Filled 2013-01-23 (×2): qty 1

## 2013-01-23 MED ORDER — HYDROCODONE-HOMATROPINE 5-1.5 MG/5ML PO SYRP
5.0000 mL | ORAL_SOLUTION | Freq: Once | ORAL | Status: AC
  Administered 2013-01-24: 5 mL via ORAL
  Filled 2013-01-23: qty 5

## 2013-01-23 NOTE — ED Provider Notes (Signed)
CSN: [id number]     Arrival date & time 01/23/13  2314 History   First MD Initiated Contact with Patient 01/23/13 2339     Chief Complaint  Patient presents with  . Cough   (Consider location/radiation/quality/duration/timing/severity/associated sxs/prior Treatment) Patient is a 47 y.o. female presenting with cough.  Cough Associated symptoms: rhinorrhea and sore throat   Associated symptoms: no chest pain, no chills, no fever, no headaches, no rash, no shortness of breath and no wheezing    Patient is a former smoker with multiple episodes of bronchitis. Seen yesterday by her primary Dr. for coughing times one day. Patient denies fevers or chills. The cough is nonproductive. Per her cousin she becomes anxious when she is coughing. She denies chest pain. Her primary Dr. prescribed cough medication which has been of little benefit. Patient will complete and complains of mild hoarseness.   Past Medical History  Diagnosis Date  . Impaired glucose tolerance 11/07/2010  . Hepatitis C approx dx 2005    no tx to date  . Pancreatitis     HX of  . Alcohol dependency   . GERD (gastroesophageal reflux disease)   . Chronic constipation   . Gallstones   . Drug dependency     Hx of cocaine use  . Depression   . Rheumatoid factor positive   . Hemochromatosis     last phlebotomy tx 2-3 yrs  . Pancytopenia   . Abnormal vaginal bleeding     uterine fibroid  . Gallstone 11/09/2010  . Chronic abdominal pain 11/09/2010  . Cirrhosis 11/09/2010  . Type II or unspecified type diabetes mellitus without mention of complication, uncontrolled 09/17/2011  . Anxiety 09/17/2011   Past Surgical History  Procedure Laterality Date  . Appendectomy    . Cesarean section  x 2  . Foot surgery  2013  . Abdominal hysterectomy  12/17/2011    Procedure: HYSTERECTOMY ABDOMINAL;  Surgeon: Miguel Aschoff, MD;  Location: [facility];  Service: Gynecology;  Laterality: N/A;  . Salpingoophorectomy  12/17/2011    Procedure:  SALPINGO OOPHERECTOMY;  Surgeon: Miguel Aschoff, MD;  Location: [facility];  Service: Gynecology;  Laterality: Bilateral;   Family History  Problem Relation Age of Onset  . Cancer Mother     ovarian  . Diabetes Father    History  Substance Use Topics  . Smoking status: Former Games developer  . Smokeless tobacco: Not on file  . Alcohol Use: Yes   OB History   Grav Para Term Preterm Abortions TAB SAB Ect Mult Living   2 2 2       3      Review of Systems  Constitutional: Negative for fever and chills.  HENT: Positive for congestion, sore throat and rhinorrhea.   Respiratory: Positive for cough. Negative for shortness of breath and wheezing.   Cardiovascular: Negative for chest pain.  Gastrointestinal: Negative for nausea, vomiting and abdominal pain.  Musculoskeletal: Negative for back pain.  Skin: Negative for rash and wound.  Neurological: Negative for dizziness, weakness, light-headedness, numbness and headaches.  All other systems reviewed and are negative.    Allergies  Iodinated diagnostic agents  Home Medications   Current Outpatient Rx  Name  Route  Sig  Dispense  Refill  . azithromycin (ZITHROMAX Z-PAK) 250 MG tablet      Use as directed   6 each   1   . benzonatate (TESSALON PERLES) 100 MG capsule      1-2 tab by mouth every 8 hrs  as needed for cough   60 capsule   1   . esomeprazole (NEXIUM) 40 MG capsule   Oral   Take 40 mg by mouth daily as needed (heart burn).         . hydrOXYzine (ATARAX/VISTARIL) 25 MG tablet   Oral   Take 1 tablet (25 mg total) by mouth every 6 (six) hours as needed for anxiety (or CIWA score </= 10).   30 tablet   0   . insulin aspart (NOVOLOG) 100 UNIT/ML injection   Subcutaneous   Inject 2 Units into the skin 3 (three) times daily with meals.         Marland Kitchen linagliptin (TRADJENTA) 5 MG TABS tablet   Oral   Take 5 mg by mouth daily.         . metFORMIN (GLUCOPHAGE) 500 MG tablet   Oral   Take 1 tablet (500 mg total) by mouth 2  (two) times daily with a meal.   60 tablet   11   . Multiple Vitamin (MULTIVITAMIN WITH MINERALS) TABS   Oral   Take 1 tablet by mouth daily.         . sertraline (ZOLOFT) 100 MG tablet   Oral   Take 1 tablet (100 mg total) by mouth daily.   90 tablet   3   . traZODone (DESYREL) 50 MG tablet   Oral   Take 1 tablet (50 mg total) by mouth at bedtime and may repeat dose one time if needed.   60 tablet   0   . zolpidem (AMBIEN) 10 MG tablet      TAKE 1 TABLET AT BEDTIME AS NEEDED FOR SLEEP   90 tablet   1    BP 113/88  Pulse 100  Temp(Src) 98.1 F (36.7 C) (Oral)  Resp 22  Wt 134 lb 4.8 oz (60.918 kg)  BMI 24.56 kg/m2  SpO2 99%  LMP 11/01/2011 Physical Exam  Nursing note and vitals reviewed. Constitutional: She is oriented to person, place, and time. She appears well-developed and well-nourished. No distress.  Patient is quiet and comfortable when no one is in the room. She is not coughing. As I entered the room the patient starts coughing that appears to be voluntary. Her hoarseness is intermittent  HENT:  Head: Normocephalic and atraumatic.  Mouth/Throat: No oropharyngeal exudate.   Mildly erythematous oropharynx. There is no oropharyngeal swelling  Eyes: EOM are normal. Pupils are equal, round, and reactive to light.  Neck: Normal range of motion. Neck supple.  Cardiovascular: Normal rate and regular rhythm.   Pulmonary/Chest: Effort normal and breath sounds normal. No respiratory distress. She has no wheezes. She has no rales. She exhibits no tenderness.  Abdominal: Soft. Bowel sounds are normal. She exhibits no distension and no mass. There is no tenderness. There is no rebound and no guarding.  Musculoskeletal: Normal range of motion. She exhibits no edema and no tenderness.  No calf swelling or tenderness.  Lymphadenopathy:    She has no cervical adenopathy.  Neurological: She is alert and oriented to person, place, and time.  Extremities without deficit.  Sensation fully intact.  Skin: Skin is warm and dry. No rash noted. No erythema.  Psychiatric:  Patient appears anxious.    ED Course  Procedures (including critical care time) Labs Review Labs Reviewed - No data to display Imaging Review No results found.  MDM    No acute findings on chest x-ray. Patient protecting airway. Vital signs stable.  We'll symptomatically treat cough and have patient follow with primary Dr.  Loren Racer, MD 01/24/13 [phone]

## 2013-01-23 NOTE — ED Notes (Addendum)
C/o violent, persistant, constant cough. Voice is hoarse. Also c/o sore throat, hurts to swallow. Onset last week, worse in last 2d. Seen by PCP Dr. Jonny Ruiz last week, given something for cough but it is not helping. Denies fever or other sx, no meds PTA. "Is not prescribed inhaler type medication".

## 2013-01-24 ENCOUNTER — Emergency Department (HOSPITAL_COMMUNITY): Payer: BC Managed Care – PPO

## 2013-01-24 MED ORDER — IBUPROFEN 600 MG PO TABS: 600.0000 mg | ORAL_TABLET | Freq: Four times a day (QID) | ORAL | Status: DC | PRN

## 2013-01-24 MED ORDER — HYDROCODONE-HOMATROPINE 5-1.5 MG/5ML PO SYRP: 5.0000 mL | ORAL_SOLUTION | Freq: Four times a day (QID) | ORAL | Status: DC | PRN

## 2013-02-18 ENCOUNTER — Ambulatory Visit (INDEPENDENT_AMBULATORY_CARE_PROVIDER_SITE_OTHER)
Admission: RE | Admit: 2013-02-18 | Discharge: 2013-02-18 | Disposition: A | Discharge status: Home / Self Care | Payer: BC Managed Care – PPO | Source: Ambulatory Visit | Attending: Internal Medicine | Admitting: Internal Medicine

## 2013-02-18 ENCOUNTER — Ambulatory Visit (INDEPENDENT_AMBULATORY_CARE_PROVIDER_SITE_OTHER): Payer: BC Managed Care – PPO | Admitting: Internal Medicine

## 2013-02-18 ENCOUNTER — Encounter: Payer: Self-pay | Admitting: Internal Medicine

## 2013-02-18 VITALS — BP 110/70 | HR 80 | Temp 98.3°F | Ht 62.0 in | Wt 142.4 lb

## 2013-02-18 DIAGNOSIS — L02619 Cutaneous abscess of unspecified foot: Secondary | ICD-10-CM

## 2013-02-18 DIAGNOSIS — S93409A Sprain of unspecified ligament of unspecified ankle, initial encounter: Secondary | ICD-10-CM | POA: Insufficient documentation

## 2013-02-18 DIAGNOSIS — L03119 Cellulitis of unspecified part of limb: Secondary | ICD-10-CM

## 2013-02-18 MED ORDER — CEFTRIAXONE SODIUM 1 G IJ SOLR
1.0000 g | Freq: Once | INTRAMUSCULAR | Status: AC
  Administered 2013-02-18: 1 g via INTRAMUSCULAR

## 2013-02-18 MED ORDER — KETOROLAC TROMETHAMINE 30 MG/ML IJ SOLN
30.0000 mg | Freq: Once | INTRAMUSCULAR | Status: AC
  Administered 2013-02-18: 30 mg via INTRAMUSCULAR

## 2013-02-18 MED ORDER — OXYCODONE HCL 5 MG PO TABS: ORAL_TABLET | ORAL | Status: DC

## 2013-02-18 MED ORDER — SULFAMETHOXAZOLE-TRIMETHOPRIM 800-160 MG PO TABS: 1.0000 | ORAL_TABLET | Freq: Two times a day (BID) | ORAL | Status: DC

## 2013-02-18 NOTE — Patient Instructions (Addendum)
You had the pain shot, and antibiotic shot today Please take all new medication as prescribed - the pain medication Please continue all other medications as before Please have the pharmacy call with any other refills you may need.  Please go to the XRAY Department in the Basement (go straight as you get off the elevator) for the x-ray testing  You will be contacted by phone if any changes need to be made immediately.  Otherwise, you will receive a letter about your results with an explanation, but please check with MyChart first.  Please go to ER immediately if you feel the foot red/swelling/tender is worse, or if there is drainage from the foot

## 2013-02-18 NOTE — Assessment & Plan Note (Signed)
For film today, toradol IM now, ACE wrap if not more painful for support, to ortho if fx

## 2013-02-18 NOTE — Progress Notes (Signed)
Subjective:    Patient ID: [name], female    DOB: 12/21/1965, 47 y.o.   MRN: [id number]  HPI  Here after simple trip and fall up a few stairs at the home, with turning of the left foot x 8 days, just not improving with ongoing severe limp, no further falls but constant. Had an abrasion to the dorsal foot at MTP's and now 2 days onset even worse red/tender/swelling starting to move the midfoot and ankle, no drainage or red streaks No fever or further injury Past Medical History  Diagnosis Date  . Impaired glucose tolerance 11/07/2010  . Hepatitis C approx dx 2005    no tx to date  . Pancreatitis     HX of  . Alcohol dependency   . GERD (gastroesophageal reflux disease)   . Chronic constipation   . Gallstones   . Drug dependency     Hx of cocaine use  . Depression   . Rheumatoid factor positive   . Hemochromatosis     last phlebotomy tx 2-3 yrs  . Pancytopenia   . Abnormal vaginal bleeding     uterine fibroid  . Gallstone 11/09/2010  . Chronic abdominal pain 11/09/2010  . Cirrhosis 11/09/2010  . Type II or unspecified type diabetes mellitus without mention of complication, uncontrolled 09/17/2011  . Anxiety 09/17/2011   Past Surgical History  Procedure Laterality Date  . Appendectomy    . Cesarean section  x 2  . Foot surgery  2013  . Abdominal hysterectomy  12/17/2011    Procedure: HYSTERECTOMY ABDOMINAL;  Surgeon: Miguel Aschoff, MD;  Location: [facility];  Service: Gynecology;  Laterality: N/A;  . Salpingoophorectomy  12/17/2011    Procedure: SALPINGO OOPHERECTOMY;  Surgeon: Miguel Aschoff, MD;  Location: [facility];  Service: Gynecology;  Laterality: Bilateral;    reports that she has quit smoking. She does not have any smokeless tobacco history on file. She reports that she drinks alcohol. She reports that she does not use illicit drugs. family history includes Cancer in her mother; Diabetes in her father. Allergies  Allergen Reactions  . Iodinated Diagnostic Agents Hives    Pt  states allergic to Betadine.   Current Outpatient Prescriptions on File Prior to Visit  Medication Sig Dispense Refill  . esomeprazole (NEXIUM) 40 MG capsule Take 40 mg by mouth daily as needed (heart burn).      Marland Kitchen HYDROcodone-homatropine (HYCODAN) 5-1.5 MG/5ML syrup Take 5 mLs by mouth every 6 (six) hours as needed for cough.  120 mL  0  . hydrOXYzine (ATARAX/VISTARIL) 25 MG tablet Take 1 tablet (25 mg total) by mouth every 6 (six) hours as needed for anxiety (or CIWA score </= 10).  30 tablet  0  . ibuprofen (ADVIL,MOTRIN) 600 MG tablet Take 1 tablet (600 mg total) by mouth every 6 (six) hours as needed for pain.  30 tablet  0  . insulin aspart (NOVOLOG) 100 UNIT/ML injection Inject 2 Units into the skin 3 (three) times daily with meals.      Marland Kitchen linagliptin (TRADJENTA) 5 MG TABS tablet Take 5 mg by mouth daily.      . metFORMIN (GLUCOPHAGE) 500 MG tablet Take 1 tablet (500 mg total) by mouth 2 (two) times daily with a meal.  60 tablet  11  . Multiple Vitamin (MULTIVITAMIN WITH MINERALS) TABS Take 1 tablet by mouth daily.      . sertraline (ZOLOFT) 100 MG tablet Take 1 tablet (100 mg total) by mouth daily.  90 tablet  3  . traZODone (DESYREL) 50 MG tablet Take 1 tablet (50 mg total) by mouth at bedtime and may repeat dose one time if needed.  60 tablet  0  . zolpidem (AMBIEN) 10 MG tablet TAKE 1 TABLET AT BEDTIME AS NEEDED FOR SLEEP  90 tablet  1   No current facility-administered medications on file prior to visit.   Review of Systems  Constitutional: Negative for unexpected weight change, or unusual diaphoresis  HENT: Negative for tinnitus.   Eyes: Negative for photophobia and visual disturbance.  Respiratory: Negative for choking and stridor.   Gastrointestinal: Negative for vomiting and blood in stool.  Genitourinary: Negative for hematuria and decreased urine volume.  Musculoskeletal: Negative for acute joint swelling Skin: Negative for color change and wound.  Neurological: Negative  for tremors and numbness other than noted  Psychiatric/Behavioral: Negative for decreased concentration or  hyperactivity.       Objective:   Physical Exam BP 110/70  Pulse 80  Temp(Src) 98.3 F (36.8 C) (Oral)  Ht 5\' 2"  (1.575 m)  Wt 142 lb 6 oz (64.581 kg)  BMI 26.03 kg/m2  SpO2 91%  LMP 11/01/2011 VS noted,  Constitutional: Pt appears well-developed and well-nourished.  HENT: Head: NCAT.  Right Ear: External ear normal.  Left Ear: External ear normal.  Eyes: Conjunctivae and EOM are normal. Pupils are equal, round, and reactive to light.  Neck: Normal range of motion. Neck supple.  Cardiovascular: Normal rate and regular rhythm.   Pulmonary/Chest: Effort normal and breath sounds normal.  Abd:  Soft, NT, non-distended, + BS Neurological: Pt is alert. Not confused  Skin: Skin is warm. No erythema. Left dorsal foot with 3 cm area at the MTp's with red/tender/swelling, no red streaks or drainage, left foot o/w neurovasc intact Left ankle with marked swelling laterally with bruising, tender Psychiatric: Pt behavior is normal. Thought content normal.      Assessment & Plan:

## 2013-02-18 NOTE — Assessment & Plan Note (Addendum)
Resulting from abrasion to distal dorsal foot, for rocephin IM, also septra ds bid, to ER for any worsening pain, swelling, drainage; for film today as well

## 2013-02-21 ENCOUNTER — Telehealth: Payer: Self-pay | Admitting: Internal Medicine

## 2013-02-21 DIAGNOSIS — S99922A Unspecified injury of left foot, initial encounter: Secondary | ICD-10-CM

## 2013-02-21 NOTE — Telephone Encounter (Signed)
Robin or lucy to contact pt; left foot xray and ankle xray without fx, but has a midfoot injury called Lisfranc injury, we need to refer to Dr Smith/[facility]ports [facility]

## 2013-02-21 NOTE — Assessment & Plan Note (Signed)
stable overall by history and exam, recent data reviewed with pt, and pt to continue medical treatment as before,  to f/u any worsening symptoms or concerns Lab Results  Component Value Date   HGBA1C 7.1* 01/13/2013

## 2013-02-22 NOTE — Telephone Encounter (Signed)
Called pt no answer LMOM (cell) to RTC...lmb

## 2013-02-22 NOTE — Telephone Encounter (Signed)
Called pt again still no answer LMOM RTC.../lmb 

## 2013-02-23 NOTE — Telephone Encounter (Signed)
Called the patient informed of results and referral.  She is ok with referral to Dr. Katrinka Blazing

## 2013-02-23 NOTE — Telephone Encounter (Signed)
Tried calling pt again still no answer @ home #. Left another msg on cell # to RTC to Robin concerning xray...Raechel Chute

## 2013-02-24 ENCOUNTER — Encounter: Payer: Self-pay | Admitting: Family Medicine

## 2013-02-24 ENCOUNTER — Other Ambulatory Visit (INDEPENDENT_AMBULATORY_CARE_PROVIDER_SITE_OTHER): Payer: BC Managed Care – PPO

## 2013-02-24 ENCOUNTER — Ambulatory Visit (INDEPENDENT_AMBULATORY_CARE_PROVIDER_SITE_OTHER): Payer: BC Managed Care – PPO | Admitting: Family Medicine

## 2013-02-24 VITALS — BP 138/88 | HR 76 | Wt 140.0 lb

## 2013-02-24 DIAGNOSIS — M79672 Pain in left foot: Secondary | ICD-10-CM

## 2013-02-24 DIAGNOSIS — M79609 Pain in unspecified limb: Secondary | ICD-10-CM

## 2013-02-24 DIAGNOSIS — S93409A Sprain of unspecified ligament of unspecified ankle, initial encounter: Secondary | ICD-10-CM

## 2013-02-24 NOTE — Progress Notes (Signed)
I'm seeing this patient by the request  of:  Oliver Barre, MD   CC: Left foot and ankle pain  HPI: Patient is a 47 year old female coming in with left ankle and foot pain. Patient did injure it over a week ago. Patient states that she tripped over a rock. Patient does not rib or exactly how the injury occurred. Patient states that she did have swelling immediately that did seem to resolve over the course of days. Patient still states that she does have ankle pain mostly in the middle portion. Patient also at complains of dorsal foot pain. Patient is able to ambulate and states she does think it is improving slowly. Patient to continues to have a chronic dull aching sensation. Patient denies any significant nighttime awakening. Patient is a severity a 7/10. Patient was given some pain medication by primary care provider which are almost gone. Patient has tried some ice which was beneficial.   Past medical, surgical, family and social history reviewed. Medications reviewed all in the electronic medical record.   Review of Systems: No headache, visual changes, nausea, vomiting, diarrhea, constipation, dizziness, abdominal pain, skin rash, fevers, chills, night sweats, weight loss, swollen lymph nodes, body aches, joint swelling, muscle aches, chest pain, shortness of breath, mood changes.   Objective:    Blood pressure 138/88, pulse 76, weight 140 lb (63.504 kg), last menstrual period 11/01/2011, SpO2 97.00%.   General: No apparent distress alert and oriented x3 mood and affect normal, dressed appropriately.  HEENT: Pupils equal, extraocular movements intact Respiratory: Patient's speak in full sentences and does not appear short of breath Cardiovascular: No lower extremity edema, non tender, no erythema Skin: Warm dry intact with no signs of infection or rash on extremities or on axial skeleton. Abdomen: Soft nontender Neuro: Cranial nerves II through XII are intact, neurovascularly intact in all  extremities with 2+ DTRs and 2+ pulses. Lymph: No lymphadenopathy of posterior or anterior cervical chain or axillae bilaterally.  Gait normal with good balance and coordination.  MSK: Non tender with full range of motion and good stability and symmetric strength and tone of shoulders, elbows, wrist, hip, knee bilaterally.   Ankle:left No visible erythema or swelling. Range of motion is full in all directions with mild pain with dorsiflexion.  Strength is 5/5 in all directions. Stable lateral and medial ligaments; squeeze test and kleiger test unremarkable; Talar dome nontender; No pain at base of 5th MT; No tenderness over cuboid; No tenderness over N spot or navicular prominence No tenderness on posterior aspects of lateral and medial malleolus No sign of peroneal tendon subluxations or tenderness to palpation Negative tarsal tunnel tinel's Able to walk 4 steps. Pain to palpation over mid foot. Area of brusing seems to be resolving.   MSK US performed FA:OZHY ankle and foot.  This study was ordered, performed, and interpreted by Terrilee Files D.O.  Foot/Ankle:   All structures visualized.   Talar dome unremarkable  Ankle mortise without effusion. Peroneus longus and brevis tendons unremarkable on long and transverse views without sheath effusions. Posterior tibialis, flexor hallucis longus, and flexor digitorum longus tendons unremarkable on long and transverse views without sheath effusions. Achilles tendon visualized along length of tendon and unremarkable on long and transverse views without sheath effusion. Anterior Talofibular Ligament and Calcaneofibular Ligaments unremarkable and intact to do have some mild hypoechoic changes as well as some increased Doppler flow. Deltoid Ligament unremarkable and intact. Plantar fascia intact and without effusion, normal thickness. No increased doppler signal,  cap sign, or thickening of tibial cortex. Power doppler signal normal.  mild  midfoot arthritis  IMPRESSION:  Relatively normal ultrasound with questionable grade 1 sprain of the ATFL and midfoot arthritis.    Impression and Recommendations:     This case required medical decision making of moderate complexity.

## 2013-02-24 NOTE — Patient Instructions (Signed)
Very nice to meet you I do not see a fracture today Try wearing the brace with activity Ice baths 20 minutes 2 times a day I do not think you will need pain medicines Do exercises daily.  Come back in 2 weeks if not better.

## 2013-02-24 NOTE — Assessment & Plan Note (Signed)
Patient does have what appears to be more of a spurring on physical exam as well as on ultrasound today. I do see some midfoot arthritis but I do not think there is a true Lisfranc injury. Patient was braced by me today. She will wear this with activity Discussed icing protocol. Do not feel comfortable giving pain medication secondary to her comorbidities and prior history of substance abuse. Patient and come back again in 3 weeks if she continues to have pain and we did consider doing a midfoot injection for diagnostic as well as therapeutic modalities.

## 2013-04-08 ENCOUNTER — Emergency Department (HOSPITAL_COMMUNITY)
Admission: EM | Admit: 2013-04-08 | Discharge: 2013-04-08 | Disposition: A | Discharge status: Home / Self Care | Payer: BC Managed Care – PPO | Attending: Emergency Medicine | Admitting: Emergency Medicine

## 2013-04-08 ENCOUNTER — Encounter (HOSPITAL_COMMUNITY): Payer: Self-pay | Admitting: Emergency Medicine

## 2013-04-08 ENCOUNTER — Emergency Department (HOSPITAL_COMMUNITY): Payer: BC Managed Care – PPO

## 2013-04-08 DIAGNOSIS — IMO0001 Reserved for inherently not codable concepts without codable children: Secondary | ICD-10-CM | POA: Insufficient documentation

## 2013-04-08 DIAGNOSIS — Z862 Personal history of diseases of the blood and blood-forming organs and certain disorders involving the immune mechanism: Secondary | ICD-10-CM | POA: Insufficient documentation

## 2013-04-08 DIAGNOSIS — Y939 Activity, unspecified: Secondary | ICD-10-CM | POA: Insufficient documentation

## 2013-04-08 DIAGNOSIS — J3489 Other specified disorders of nose and nasal sinuses: Secondary | ICD-10-CM | POA: Insufficient documentation

## 2013-04-08 DIAGNOSIS — W1809XA Striking against other object with subsequent fall, initial encounter: Secondary | ICD-10-CM | POA: Insufficient documentation

## 2013-04-08 DIAGNOSIS — K219 Gastro-esophageal reflux disease without esophagitis: Secondary | ICD-10-CM | POA: Insufficient documentation

## 2013-04-08 DIAGNOSIS — Y9229 Other specified public building as the place of occurrence of the external cause: Secondary | ICD-10-CM | POA: Insufficient documentation

## 2013-04-08 DIAGNOSIS — G8929 Other chronic pain: Secondary | ICD-10-CM | POA: Insufficient documentation

## 2013-04-08 DIAGNOSIS — Z8619 Personal history of other infectious and parasitic diseases: Secondary | ICD-10-CM | POA: Insufficient documentation

## 2013-04-08 DIAGNOSIS — F329 Major depressive disorder, single episode, unspecified: Secondary | ICD-10-CM | POA: Insufficient documentation

## 2013-04-08 DIAGNOSIS — F3289 Other specified depressive episodes: Secondary | ICD-10-CM | POA: Insufficient documentation

## 2013-04-08 DIAGNOSIS — R42 Dizziness and giddiness: Secondary | ICD-10-CM | POA: Insufficient documentation

## 2013-04-08 DIAGNOSIS — S0083XA Contusion of other part of head, initial encounter: Secondary | ICD-10-CM

## 2013-04-08 DIAGNOSIS — Z794 Long term (current) use of insulin: Secondary | ICD-10-CM | POA: Insufficient documentation

## 2013-04-08 DIAGNOSIS — F411 Generalized anxiety disorder: Secondary | ICD-10-CM | POA: Insufficient documentation

## 2013-04-08 DIAGNOSIS — Z79899 Other long term (current) drug therapy: Secondary | ICD-10-CM | POA: Insufficient documentation

## 2013-04-08 DIAGNOSIS — S0003XA Contusion of scalp, initial encounter: Secondary | ICD-10-CM | POA: Insufficient documentation

## 2013-04-08 DIAGNOSIS — Z87891 Personal history of nicotine dependence: Secondary | ICD-10-CM | POA: Insufficient documentation

## 2013-04-08 MED ORDER — HYDROCODONE-ACETAMINOPHEN 5-325 MG PO TABS
1.0000 | ORAL_TABLET | Freq: Once | ORAL | Status: AC
  Administered 2013-04-08: 1 via ORAL
  Filled 2013-04-08: qty 1

## 2013-04-08 MED ORDER — IBUPROFEN 800 MG PO TABS: 800.0000 mg | ORAL_TABLET | Freq: Three times a day (TID) | ORAL | Status: DC | PRN

## 2013-04-08 MED ORDER — HYDROCODONE-ACETAMINOPHEN 5-325 MG PO TABS: 1.0000 | ORAL_TABLET | ORAL | Status: DC | PRN

## 2013-04-08 NOTE — ED Notes (Signed)
Pt presents with c/o nasal pain. Pt says she [place] the [place], hitting her nose. Pt has bruising around her right eye.

## 2013-04-08 NOTE — ED Provider Notes (Signed)
Medical screening examination/treatment/procedure(s) were performed by non-physician practitioner and as supervising physician I was immediately available for consultation/collaboration.  EKG Interpretation   None         Lyanne Co, MD 04/08/13 2259

## 2013-04-08 NOTE — Discharge Instructions (Signed)
Read the information below.  Use the prescribed medication as directed.  Please discuss all new medications with your pharmacist.  Do not take additional tylenol while taking the prescribed pain medication to avoid overdose.  You may return to the Emergency Department at any time for worsening condition or any new symptoms that concern you.     Contusion A contusion is a deep bruise. Contusions happen when an injury causes bleeding under the skin. Signs of bruising include pain, puffiness (swelling), and discolored skin. The contusion may turn blue, purple, or yellow. HOME CARE   Put ice on the injured area.  Put ice in a plastic bag.  Place a towel between your skin and the bag.  Leave the ice on for 15-20 minutes, 03-04 times a day.  Only take medicine as told by your doctor.  Rest the injured area.  If possible, raise (elevate) the injured area to lessen puffiness. GET HELP RIGHT AWAY IF:   You have more bruising or puffiness.  You have pain that is getting worse.  Your puffiness or pain is not helped by medicine. MAKE SURE YOU:   Understand these instructions.  Will watch your condition.  Will get help right away if you are not doing well or get worse. Document Released: 09/25/2007 Document Revised: 07/01/2011 Document Reviewed: 02/11/2011 [facility] Patient Information 2014 [place], [place].

## 2013-04-08 NOTE — ED Provider Notes (Signed)
CSN: [id number]     Arrival date & time 04/08/13  1422 History  This chart was scribed for non-physician practitioner working with Meredith Crease, MD by Ashley Jacobs, ED scribe. This patient was seen in room WTR9/WTR9 and the patient's care was started at 3:06 PM  First MD Initiated Contact with Patient 04/08/13 1437     Chief Complaint  Patient presents with  . Facial Injury    Onset last week   (Consider location/radiation/quality/duration/timing/severity/associated sxs/prior Treatment) The history is provided by the patient and medical records. No language interpreter was used.   HPI Comments: [name] is a 47 y.o. female who presents to the Emergency Department complaining of facial injury after an incidence of syncope eight days while in court [employer]. Pt felt light headed because she did not eat that day (pt is diabetic as well) and was very upset and nervous about her court case. She denies any other symptoms including CP, SOB, palpitations.   Pt has bruising underneath her right eye and across the bridge of her nose. The pain is worse when she smiles or with movement. She is experiencing nasal congestion and occasional nasal bleeding. Pt has tried Tylenol without relief. Her tetanus shot is UTD.     Past Medical History  Diagnosis Date  . Impaired glucose tolerance 11/07/2010  . Hepatitis C approx dx 2005    no tx to date  . Pancreatitis     HX of  . Alcohol dependency   . GERD (gastroesophageal reflux disease)   . Chronic constipation   . Gallstones   . Drug dependency     Hx of cocaine use  . Depression   . Rheumatoid factor positive   . Hemochromatosis     last phlebotomy tx 2-3 yrs  . Pancytopenia   . Abnormal vaginal bleeding     uterine fibroid  . Gallstone 11/09/2010  . Chronic abdominal pain 11/09/2010  . Cirrhosis 11/09/2010  . Type II or unspecified type diabetes mellitus without mention of complication, uncontrolled 09/17/2011  . Anxiety  09/17/2011   Past Surgical History  Procedure Laterality Date  . Appendectomy    . Cesarean section  x 2  . Foot surgery  2013  . Abdominal hysterectomy  12/17/2011    Procedure: HYSTERECTOMY ABDOMINAL;  Surgeon: Miguel Aschoff, MD;  Location: [facility];  Service: Gynecology;  Laterality: N/A;  . Salpingoophorectomy  12/17/2011    Procedure: SALPINGO OOPHERECTOMY;  Surgeon: Miguel Aschoff, MD;  Location: [facility];  Service: Gynecology;  Laterality: Bilateral;   Family History  Problem Relation Age of Onset  . Cancer Mother     ovarian  . Diabetes Father    History  Substance Use Topics  . Smoking status: Former Games developer  . Smokeless tobacco: Not on file  . Alcohol Use: Yes   OB History   Grav Para Term Preterm Abortions TAB SAB Ect Mult Living   2 2 2       3      Review of Systems  Constitutional: Negative for fever.  HENT: Positive for congestion. Negative for facial swelling.   Eyes: Negative for visual disturbance.  Cardiovascular: Negative for chest pain.  Skin: Positive for color change.  Neurological: Negative for syncope, weakness and light-headedness.    Allergies  Iodinated diagnostic agents  Home Medications   Current Outpatient Rx  Name  Route  Sig  Dispense  Refill  . esomeprazole (NEXIUM) 40 MG capsule   Oral   Take  40 mg by mouth daily as needed (heart burn).         Marland Kitchen HYDROcodone-homatropine (HYCODAN) 5-1.5 MG/5ML syrup   Oral   Take 5 mLs by mouth every 6 (six) hours as needed for cough.   120 mL   0   . hydrOXYzine (VISTARIL) 100 MG capsule   Oral   Take 50 mg by mouth at bedtime as needed for anxiety.         Marland Kitchen ibuprofen (ADVIL,MOTRIN) 600 MG tablet   Oral   Take 1 tablet (600 mg total) by mouth every 6 (six) hours as needed for pain.   30 tablet   0   . insulin aspart (NOVOLOG) 100 UNIT/ML injection   Subcutaneous   Inject 2 Units into the skin 3 (three) times daily with meals.         Marland Kitchen linagliptin (TRADJENTA) 5 MG TABS tablet   Oral    Take 5 mg by mouth daily.         . metFORMIN (GLUCOPHAGE) 1000 MG tablet   Oral   Take 1,000 mg by mouth 2 (two) times daily with a meal.         . Multiple Vitamin (MULTIVITAMIN WITH MINERALS) TABS   Oral   Take 1 tablet by mouth daily.         . sertraline (ZOLOFT) 100 MG tablet   Oral   Take 1 tablet (100 mg total) by mouth daily.   90 tablet   3   . zolpidem (AMBIEN) 10 MG tablet      TAKE 1 TABLET AT BEDTIME AS NEEDED FOR SLEEP   90 tablet   1    BP 121/85  Pulse 95  Temp(Src) 98 F (36.7 C) (Oral)  Resp 16  Wt 139 lb (63.05 kg)  SpO2 96%  LMP 11/01/2011 Physical Exam  Nursing note and vitals reviewed. Constitutional: She appears well-developed and well-nourished. No distress.  HENT:  Head: Normocephalic and atraumatic.  Bilateral nasal bone tenderness to palpation ecchymosis underneath bilateral eyes and over the bridge of her nose No maxillary tenderness or crepitus Old laceration over the right nasal area No erythema, discharge or warmth   Eyes: EOM are normal.    No blurry vision and no double vision.   Neck: Neck supple.  Cardiovascular: Regular rhythm and normal heart sounds.   Pulmonary/Chest: Effort normal.  Neurological: She is alert.  Skin: She is not diaphoretic.    ED Course  Procedures (including critical care time) DIAGNOSTIC STUDIES: Oxygen Saturation is 96% on room air, normal by my interpretation.    COORDINATION OF CARE:  3:10 PM Discussed course of care with pt which includes hydrocodone and x-ray of her nasal bones . Pt understands and agrees.   Labs Review Labs Reviewed - No data to display Imaging Review No results found.  EKG Interpretation   None       MDM   1. Facial contusion, initial encounter    Pt with facial contusion that is 65 week old following syncope during very emotional event for the patient.  Likely vasovagal syncope - pt has not had any recurrent syncope or any concerning symptoms since  this event.  Xray of nasal bones is negative.  Pt does have small laceration over the nasal area that is healing, no evidence of infection.  Discussed with patient that I am not able to close this as it is a week old.  EOMs intact. Discussed result, findings, treatment,  and follow up  with patient.  Pt given return precautions.  Pt verbalizes understanding and agrees with plan.      I personally performed the services described in this documentation, which was scribed in my presence. The recorded information has been reviewed and is accurate.    Trixie Dredge, PA-C 04/08/13 1734

## 2013-04-08 NOTE — ED Notes (Signed)
Pt alert, nad, arrives to visiting family, c/o nose pain, states fell last week

## 2013-04-13 ENCOUNTER — Ambulatory Visit (INDEPENDENT_AMBULATORY_CARE_PROVIDER_SITE_OTHER): Payer: BC Managed Care – PPO

## 2013-04-13 ENCOUNTER — Encounter: Payer: Self-pay | Admitting: Internal Medicine

## 2013-04-13 ENCOUNTER — Ambulatory Visit (INDEPENDENT_AMBULATORY_CARE_PROVIDER_SITE_OTHER): Payer: BC Managed Care – PPO | Admitting: Internal Medicine

## 2013-04-13 ENCOUNTER — Ambulatory Visit (INDEPENDENT_AMBULATORY_CARE_PROVIDER_SITE_OTHER)
Admission: RE | Admit: 2013-04-13 | Discharge: 2013-04-13 | Disposition: A | Discharge status: Home / Self Care | Payer: BC Managed Care – PPO | Source: Ambulatory Visit | Attending: Internal Medicine | Admitting: Internal Medicine

## 2013-04-13 VITALS — BP 120/78 | HR 99 | Temp 98.8°F | Ht 62.0 in | Wt 141.0 lb

## 2013-04-13 DIAGNOSIS — F1010 Alcohol abuse, uncomplicated: Secondary | ICD-10-CM

## 2013-04-13 DIAGNOSIS — Z0000 Encounter for general adult medical examination without abnormal findings: Secondary | ICD-10-CM

## 2013-04-13 DIAGNOSIS — IMO0001 Reserved for inherently not codable concepts without codable children: Secondary | ICD-10-CM

## 2013-04-13 DIAGNOSIS — B1710 Acute hepatitis C without hepatic coma: Secondary | ICD-10-CM

## 2013-04-13 DIAGNOSIS — R079 Chest pain, unspecified: Secondary | ICD-10-CM

## 2013-04-13 LAB — BASIC METABOLIC PANEL
BUN: 10 mg/dL (ref 6–23)
Calcium: 9 mg/dL (ref 8.4–10.5)
Creatinine, Ser: 0.6 mg/dL (ref 0.4–1.2)
GFR: 115.77 mL/min (ref >60.00)
Potassium: 3.6 mEq/L (ref 3.5–5.1)

## 2013-04-13 LAB — URINALYSIS, ROUTINE W REFLEX MICROSCOPIC
Bilirubin Urine: NEGATIVE
Ketones, ur: NEGATIVE
Nitrite: NEGATIVE
Specific Gravity, Urine: 1.02 (ref 1.000–1.030)
Total Protein, Urine: NEGATIVE
pH: 6.5 (ref 5.0–8.0)

## 2013-04-13 LAB — HEPATIC FUNCTION PANEL
AST: 250 U/L — ABNORMAL HIGH (ref 0–37)
Bilirubin, Direct: 0.4 mg/dL — ABNORMAL HIGH (ref 0.0–0.3)
Total Bilirubin: 1 mg/dL (ref 0.3–1.2)

## 2013-04-13 LAB — TSH: TSH: 0.36 u[IU]/mL (ref 0.35–5.50)

## 2013-04-13 LAB — CBC WITH DIFFERENTIAL/PLATELET
Basophils Absolute: 0 10*3/uL (ref 0.0–0.1)
Basophils Relative: 0.8 % (ref 0.0–3.0)
Eosinophils Relative: 2.1 % (ref 0.0–5.0)
HCT: 42.3 % (ref 36.0–46.0)
Hemoglobin: 14.5 g/dL (ref 12.0–15.0)
Lymphs Abs: 1.5 10*3/uL (ref 0.7–4.0)
MCV: 98.7 fl (ref 78.0–100.0)
Monocytes Absolute: 0.3 10*3/uL (ref 0.1–1.0)
Monocytes Relative: 8.7 % (ref 3.0–12.0)
RBC: 4.28 Mil/uL (ref 3.87–5.11)
RDW: 14.4 % (ref 11.5–14.6)
WBC: 3.6 10*3/uL — ABNORMAL LOW (ref 4.5–10.5)

## 2013-04-13 LAB — LIPID PANEL
Cholesterol: 201 mg/dL — ABNORMAL HIGH (ref 0–200)
HDL: 92.3 mg/dL (ref >39.00)
VLDL: 15.2 mg/dL (ref 0.0–40.0)

## 2013-04-13 LAB — LDL CHOLESTEROL, DIRECT: Direct LDL: 100.3 mg/dL

## 2013-04-13 MED ORDER — ESOMEPRAZOLE MAGNESIUM 40 MG PO CPDR: 40.0000 mg | DELAYED_RELEASE_CAPSULE | Freq: Every day | ORAL | Status: DC | PRN

## 2013-04-13 MED ORDER — SERTRALINE HCL 100 MG PO TABS: ORAL_TABLET | ORAL | Status: DC

## 2013-04-13 MED ORDER — TRAMADOL HCL 50 MG PO TABS: 50.0000 mg | ORAL_TABLET | Freq: Four times a day (QID) | ORAL | Status: DC | PRN

## 2013-04-13 MED ORDER — ZOLPIDEM TARTRATE 10 MG PO TABS: ORAL_TABLET | ORAL | Status: DC

## 2013-04-13 NOTE — Assessment & Plan Note (Signed)
Still not contacted  By our office regarding referral, will ask [facility]' to address

## 2013-04-13 NOTE — Assessment & Plan Note (Signed)

## 2013-04-13 NOTE — Assessment & Plan Note (Addendum)
?   Rib fx/traumatic, Ok for cxr/rib films - r/o fx, for tramadol for pain

## 2013-04-13 NOTE — Assessment & Plan Note (Signed)
To cont to abstain, f/u with ETOH classes as she is doing

## 2013-04-13 NOTE — Patient Instructions (Addendum)
Please take all new medication as prescribed - the tramadol for pain OK to increase the zoloft to150 mg per day (one and 1/2 pills per day) Please continue all other medications as before, and refills have been done if requested - the ambien for sleep Please have the pharmacy call with any other refills you may need. Please call for your yearly mammogram Please go to the XRAY Department in the Basement (go straight as you get off the elevator) for the x-ray testing Please go to the LAB in the Basement (turn left off the elevator) for the tests to be done today You will be contacted by phone if any changes need to be made immediately.  Otherwise, you will receive a letter about your results with an explanation, but please check with MyChart first.  Please remember to sign up for My Chart if you have not done so, as this will be important to you in the future with finding out test results, communicating by private email, and scheduling acute appointments online when needed.  Please return in 6 months, or sooner if needed
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
## 2013-04-13 NOTE — Progress Notes (Signed)
Pre-visit discussion using our clinic review tool. No additional management support is needed unless otherwise documented below in the visit note.  

## 2013-04-13 NOTE — Assessment & Plan Note (Signed)
With fall with a low sugar pt states due to not eating and taking the insulin, for  F/u labs today, cont same meds

## 2013-04-14 ENCOUNTER — Telehealth: Payer: Self-pay | Admitting: Internal Medicine

## 2013-04-14 ENCOUNTER — Ambulatory Visit: Payer: Self-pay | Admitting: Internal Medicine

## 2013-04-14 DIAGNOSIS — B1920 Unspecified viral hepatitis C without hepatic coma: Secondary | ICD-10-CM

## 2013-04-14 NOTE — Telephone Encounter (Signed)
Message copied by Corwin Levins on Wed Apr 14, 2013  2:10 PM ------      Message from: Janee Morn      Created: Tue Apr 13, 2013 11:51 AM      Regarding: RE: f/u referral Hep C clinic       There is no referral for Hep C clinic      ----- Message -----         From: Corwin Levins, MD         Sent: 04/13/2013  11:10 AM           To: Carollee Herter      Subject: f/u referral Hep C clinic                                Pt states was not contacted yet about a referral       ------

## 2013-04-16 ENCOUNTER — Other Ambulatory Visit: Payer: Self-pay | Admitting: Internal Medicine

## 2013-04-16 LAB — HEMOGLOBIN A1C: Hgb A1c MFr Bld: 6.5 % (ref 4.6–6.5)

## 2013-04-16 MED ORDER — CEPHALEXIN 500 MG PO CAPS: 500.0000 mg | ORAL_CAPSULE | Freq: Four times a day (QID) | ORAL | Status: DC

## 2013-05-18 ENCOUNTER — Telehealth: Payer: Self-pay

## 2013-05-18 NOTE — Telephone Encounter (Signed)
Appt at [facility].

## 2013-05-18 NOTE — Telephone Encounter (Signed)
Please see our NP today if possible or tomorrow at the latest, as insulin may need to be adjusted, or add lantus, and/or refer to encocrinology

## 2013-05-18 NOTE — Telephone Encounter (Signed)
Called back her BS has been, 346, 332, 214, 275, 325, 287, 322, 430, 421, 317, 351, 339, 275, 289 and 275 over the past 5 days, she test 3 times per day.  States also lower back pain.  She has increased to 5 units three times daily.  Advise

## 2013-05-18 NOTE — Telephone Encounter (Signed)
The patient called and is hoping to get a call back from back from [place].  She did specifically say what she needed.

## 2013-05-19 ENCOUNTER — Ambulatory Visit (INDEPENDENT_AMBULATORY_CARE_PROVIDER_SITE_OTHER): Payer: BC Managed Care – PPO | Admitting: Physician Assistant

## 2013-05-19 ENCOUNTER — Encounter: Payer: Self-pay | Admitting: Physician Assistant

## 2013-05-19 VITALS — BP 102/62 | HR 71 | Temp 98.5°F | Wt 141.1 lb

## 2013-05-19 DIAGNOSIS — R7309 Other abnormal glucose: Secondary | ICD-10-CM

## 2013-05-19 DIAGNOSIS — R739 Hyperglycemia, unspecified: Secondary | ICD-10-CM

## 2013-05-19 DIAGNOSIS — R109 Unspecified abdominal pain: Secondary | ICD-10-CM

## 2013-05-19 LAB — POCT URINALYSIS DIPSTICK
BILIRUBIN UA: NEGATIVE
GLUCOSE UA: 2000
KETONES UA: NEGATIVE
LEUKOCYTES UA: NEGATIVE
NITRITE UA: NEGATIVE
Protein, UA: NEGATIVE
RBC UA: NEGATIVE
Spec Grav, UA: <=1.005
Urobilinogen, UA: 0.2
pH, UA: 5

## 2013-05-19 MED ORDER — HYDROXYZINE PAMOATE 100 MG PO CAPS: 50.0000 mg | ORAL_CAPSULE | Freq: Every evening | ORAL | Status: DC | PRN

## 2013-05-19 MED ORDER — INSULIN GLARGINE 100 UNIT/ML SOLOSTAR PEN: 10.0000 [IU] | PEN_INJECTOR | Freq: Every day | SUBCUTANEOUS | Status: DC

## 2013-05-19 NOTE — Progress Notes (Signed)
Subjective:    Patient ID: [name], female    DOB: [birth year], 48 y.o.   MRN: [id number]  HPI Comments: Patient is a 48 year old female who presents to the clinic with complaint of elevated blood sugar and bilateral back pain. Patient reports her blood sugars have been elevated over the past five days with fasting glucose averaging 250 and daily numbers ranging in the low 300 - 400. Patient reports normally uses Novolog 2 units three times daily with recently increasing the dose to 5 units three times daily with no improvement in glucose readings. Patient monitors glucose three times daily. Also uses metformin 1000 mg twice daily. Reports back pain has been in last five days also. States had UTI over a month ago, did not know she had it at the time, was prescribed antibiotic which she completed. Denies history of fever, change in bowel/bladder habits, frequency, urgency, blood in urine, injury or trauma to back, radiating pain, saddle anesthesia or other concerns at this time.    Review of Systems  Constitutional: Negative for fever and chills.  Eyes: Negative for pain and visual disturbance.  Respiratory: Negative for chest tightness and shortness of breath.   Cardiovascular: Negative for chest pain and palpitations.  Gastrointestinal: Positive for nausea (intermitent). Negative for vomiting and diarrhea.  Genitourinary: Positive for flank pain (bilateral). Negative for frequency and hematuria.  Skin: Negative for rash.  Neurological: Negative for dizziness and headaches.  All other systems reviewed and are negative.      Objective:   Physical Exam  Vitals reviewed. Constitutional: She is oriented to person, place, and time. She appears well-developed and well-nourished.  HENT:  Head: Normocephalic and atraumatic.  Eyes: Conjunctivae are normal.  Neck: Normal range of motion.  Cardiovascular: Normal rate, regular rhythm and normal heart sounds.  Exam reveals no gallop and no  friction rub.   No murmur heard. Pulmonary/Chest: Effort normal and breath sounds normal.  Abdominal: Soft. Normal appearance and bowel sounds are normal. There is tenderness in the suprapubic area.  Neurological: She is alert and oriented to person, place, and time.  Skin: Skin is warm and dry. She is not diaphoretic.  Psychiatric: She has a normal mood and affect.   Past Medical History  Diagnosis Date  . Impaired glucose tolerance 11/07/2010  . Hepatitis C approx dx 2005    no tx to date  . Pancreatitis     HX of  . Alcohol dependency   . GERD (gastroesophageal reflux disease)   . Chronic constipation   . Gallstones   . Drug dependency     Hx of cocaine use  . Depression   . Rheumatoid factor positive   . Hemochromatosis     last phlebotomy tx 2-3 yrs  . Pancytopenia   . Abnormal vaginal bleeding     uterine fibroid  . Gallstone 11/09/2010  . Chronic abdominal pain 11/09/2010  . Cirrhosis 11/09/2010  . Type II or unspecified type diabetes mellitus without mention of complication, uncontrolled 09/17/2011  . Anxiety 09/17/2011   Lab Results  Component Value Date   WBC 3.6* 04/13/2013   HGB 14.5 04/13/2013   HCT 42.3 04/13/2013   PLT 72.0* 04/13/2013   GLUCOSE 273* 04/13/2013   CHOL 201* 04/13/2013   TRIG 76.0 04/13/2013   HDL 92.30 04/13/2013   LDLDIRECT 100.3 04/13/2013   LDLCALC 93 01/13/2013   ALT 186* 04/13/2013   AST 250* 04/13/2013   NA 139 04/13/2013   K  3.6 04/13/2013   CL 103 04/13/2013   CREATININE 0.6 04/13/2013   BUN 10 04/13/2013   CO2 29 04/13/2013   TSH 0.36 04/13/2013   INR 1.09 12/17/2011   HGBA1C 6.5 04/13/2013   MICROALBUR 1.3 04/13/2013      Assessment & Plan:   Flank pain: POC urine negative for UTI Has tramadol prescription to use for pain as needed.  Elevated blood sugar: Rx for Lantus 100 units/ml, 10 units daily at night. Patient instructed she can add on 5 units per day until fasting glucose below 200. Referral to  endocrinologist.

## 2013-05-19 NOTE — Patient Instructions (Addendum)
It was great meeting you today [name]!   Goal is to have fasting blood glucose under 200. Please phone office if you have questions.  Prescribed Lantus 10 units daily. Can add on five units daily as needed to achieve goal. Continue use of Tramadol for ongoing back pain. Return to office if no improvement.  Blood Glucose Monitoring, Adult Monitoring your blood glucose (also know as blood sugar) helps you to manage your diabetes. It also helps you and your health care provider monitor your diabetes and determine how well your treatment plan is working. WHY SHOULD YOU MONITOR YOUR BLOOD GLUCOSE?  It can help you understand how food, exercise, and medicine affect your blood glucose.  It allows you to know what your blood glucose is at any given moment. You can quickly tell if you are having low blood glucose (hypoglycemia) or high blood glucose (hyperglycemia).  It can help you and your health care provider know how to adjust your medicines.  It can help you understand how to manage an illness or adjust medicine for exercise. WHEN SHOULD YOU TEST? Your health care provider will help you decide how often you should check your blood glucose. This may depend on the type of diabetes you have, your diabetes control, or the types of medicines you are taking. Be sure to write down all of your blood glucose readings so that this information can be reviewed with your health care provider. See below for examples of testing times that your health care provider may suggest. Type 1 Diabetes  Test 4 times a day if you are in good control, using an insulin pump, or perform multiple daily injections.  If your diabetes is not well-controlled or if you are sick, you may need to monitor more often.  It is a good idea to also monitor:  Before and after exercise.  Between meals and 2 hours after a meal.  Occasionally between 2:00 to 3:00 am. Type 2 Diabetes  It can vary with each person, but generally,  if you are on insulin, test 4 times a day.  If you take medicines by mouth (orally), test 2 times a day.  If you are on a controlled diet, test once a day.  If your diabetes is not well controlled or if you are sick, you may need to monitor more often. HOW TO MONITOR YOUR BLOOD GLUCOSE Supplies Needed  Blood glucose meter.  Test strips for your meter. Each meter has its own strips. You must use the strips that go with your own meter.  A pricking needle (lancet).  A device that holds the lancet (lancing device).  A journal or log book to write down your results. Procedure  Wash your hands with soap and water. Alcohol is not preferred.  Prick the side of your finger (not the tip) with the lancet.  Gently milk the finger until a small drop of blood appears.  Follow the instructions that come with your meter for inserting the test strip, applying blood to the strip, and using your blood glucose meter. Other Areas to Get Blood for Testing Some meters allow you to use other areas of your body (other than your finger) to test your blood. These areas are called alternative sites. The most common alternative sites are:  The forearm.  The thigh.  The back area of the lower leg.  The palm of the hand. The blood flow in these areas is slower. Therefore, the blood glucose values you get may  be delayed, and the numbers are different from what you would get from your fingers. Do not use alternative sites if you think you are having hypoglycemia. Your reading will not be accurate. Always use a finger if you are having hypoglycemia. Also, if you cannot feel your lows (hypoglycemia unawareness), always use your fingers for your blood glucose checks. ADDITIONAL TIPS FOR GLUCOSE MONITORING  Do not reuse lancets.  Always carry your supplies with you.  All blood glucose meters have a 24-hour "hotline" number to call if you have questions or need help.  Adjust (calibrate) your blood glucose  meter with a control solution after finishing a few boxes of strips. BLOOD GLUCOSE RECORD KEEPING It is a good idea to keep a daily record or log of your blood glucose readings. Most glucose meters, if not all, keep your glucose records stored in the meter. Some meters come with the ability to download your records to your home computer. Keeping a record of your blood glucose readings is especially helpful if you are wanting to look for patterns. Make notes to go along with the blood glucose readings because you might forget what happened at that exact time. Keeping good records helps you and your health care provider to work together to achieve good diabetes management.  Document Released: 04/11/2003 Document Revised: 12/09/2012 Document Reviewed: 08/31/2012 [facility] Patient Information 2014 [place].     Flank Pain Flank pain is pain in your side. The flank is the area of your side between your upper belly (abdomen) and your back. Pain in this area can be caused by many different things. Earth care and treatment will depend on the cause of your pain.  Rest as told by your doctor.  Drink enough fluids to keep your pee (urine) clear or pale yellow.  Only take medicine as told by your doctor.  Tell your doctor about any changes in your pain.  Follow up with your doctor. GET HELP RIGHT AWAY IF:   Your pain does not get better with medicine.   You have new symptoms or your symptoms get worse.  Your pain gets worse.   You have belly (abdominal) pain.   You are short of breath.   You always feel sick to your stomach (nauseous).   You keep throwing up (vomiting).   You have puffiness (swelling) in your belly.   You feel lightheaded or you pass out (faint).   You have blood in your pee.  You have a fever or lasting symptoms for more than 2 3 days.  You have a fever and your symptoms suddenly get worse. MAKE SURE YOU:   Understand these  instructions.  Will watch your condition.  Will get help right away if you are not doing well or get worse. Document Released: 01/16/2008 Document Revised: 01/01/2012 Document Reviewed: 11/21/2011 [facility] Patient Information 2014 [place].

## 2013-05-19 NOTE — Progress Notes (Signed)
Pre-visit discussion using our clinic review tool. No additional management support is needed unless otherwise documented below in the visit note.  

## 2013-05-25 ENCOUNTER — Telehealth: Payer: Self-pay | Admitting: *Deleted

## 2013-05-25 NOTE — Telephone Encounter (Signed)
Called the patient and she does not want compound.

## 2013-05-25 NOTE — Telephone Encounter (Signed)
Called left message to call back 

## 2013-05-25 NOTE — Telephone Encounter (Signed)
As this was not requested by the patient, we had no way of knowing that this was appropriate  Ok for [name] to contact pt if possible, to disregard if pt does not want

## 2013-05-25 NOTE — Telephone Encounter (Signed)
Corene Cornea, with [facility], phoned requesting order for a topical compound for patient for neuropathy pain.  States original was sent a couple of weeks ago.  Please advise.   CB# [phone]

## 2013-05-27 ENCOUNTER — Ambulatory Visit: Payer: BC Managed Care – PPO | Admitting: Endocrinology

## 2013-06-04 ENCOUNTER — Other Ambulatory Visit: Payer: Self-pay | Admitting: Internal Medicine

## 2013-06-21 ENCOUNTER — Telehealth: Payer: Self-pay

## 2013-06-21 DIAGNOSIS — R739 Hyperglycemia, unspecified: Secondary | ICD-10-CM

## 2013-06-21 NOTE — Telephone Encounter (Signed)
The patient called and is hoping to get samples or an rx for insulin for two weeks.  She states she has to be in jail from 3[place]9-3/23 and will need her insulin medication during that time.   Thanks!

## 2013-06-22 MED ORDER — INSULIN GLARGINE 100 UNIT/ML SOLOSTAR PEN: 10.0000 [IU] | PEN_INJECTOR | Freq: Every day | SUBCUTANEOUS | Status: DC

## 2013-06-22 MED ORDER — INSULIN ASPART 100 UNIT/ML ~~LOC~~ SOLN: 2.0000 [IU] | Freq: Three times a day (TID) | SUBCUTANEOUS | Status: DC

## 2013-06-22 NOTE — Telephone Encounter (Signed)
Patient informed of MD instructions.  As requested sent insulin into [facility] [place]

## 2013-06-22 NOTE — Telephone Encounter (Signed)
Sorry, office policy is for samples normally only given at [facility] for rx  - to robin to help

## 2013-07-15 ENCOUNTER — Other Ambulatory Visit: Payer: Self-pay | Admitting: Internal Medicine

## 2013-07-24 ENCOUNTER — Other Ambulatory Visit: Payer: Self-pay | Admitting: Internal Medicine

## 2013-09-09 ENCOUNTER — Ambulatory Visit: Payer: BC Managed Care – PPO | Admitting: Internal Medicine

## 2013-09-22 ENCOUNTER — Encounter (HOSPITAL_COMMUNITY): Payer: Self-pay | Admitting: Emergency Medicine

## 2013-09-22 ENCOUNTER — Emergency Department (HOSPITAL_COMMUNITY)
Admission: EM | Admit: 2013-09-22 | Discharge: 2013-09-23 | Disposition: A | Discharge status: Other Acute Inpatient Hospital | Payer: Federal, State, Local not specified - Other | Attending: Dermatology | Admitting: Dermatology

## 2013-09-22 DIAGNOSIS — T1491XA Suicide attempt, initial encounter: Secondary | ICD-10-CM | POA: Diagnosis present

## 2013-09-22 DIAGNOSIS — R739 Hyperglycemia, unspecified: Secondary | ICD-10-CM

## 2013-09-22 DIAGNOSIS — T438X2A Poisoning by other psychotropic drugs, intentional self-harm, initial encounter: Secondary | ICD-10-CM | POA: Insufficient documentation

## 2013-09-22 DIAGNOSIS — T43502A Poisoning by unspecified antipsychotics and neuroleptics, intentional self-harm, initial encounter: Secondary | ICD-10-CM | POA: Insufficient documentation

## 2013-09-22 DIAGNOSIS — T50902A Poisoning by unspecified drugs, medicaments and biological substances, intentional self-harm, initial encounter: Secondary | ICD-10-CM

## 2013-09-22 DIAGNOSIS — F10929 Alcohol use, unspecified with intoxication, unspecified: Secondary | ICD-10-CM

## 2013-09-22 DIAGNOSIS — Z8619 Personal history of other infectious and parasitic diseases: Secondary | ICD-10-CM | POA: Insufficient documentation

## 2013-09-22 DIAGNOSIS — T43204A Poisoning by unspecified antidepressants, undetermined, initial encounter: Secondary | ICD-10-CM | POA: Insufficient documentation

## 2013-09-22 DIAGNOSIS — F32A Depression, unspecified: Secondary | ICD-10-CM

## 2013-09-22 DIAGNOSIS — F3289 Other specified depressive episodes: Secondary | ICD-10-CM | POA: Insufficient documentation

## 2013-09-22 DIAGNOSIS — F329 Major depressive disorder, single episode, unspecified: Secondary | ICD-10-CM | POA: Diagnosis present

## 2013-09-22 DIAGNOSIS — Z79899 Other long term (current) drug therapy: Secondary | ICD-10-CM | POA: Insufficient documentation

## 2013-09-22 DIAGNOSIS — F419 Anxiety disorder, unspecified: Secondary | ICD-10-CM

## 2013-09-22 DIAGNOSIS — Z87891 Personal history of nicotine dependence: Secondary | ICD-10-CM | POA: Insufficient documentation

## 2013-09-22 DIAGNOSIS — F411 Generalized anxiety disorder: Secondary | ICD-10-CM | POA: Insufficient documentation

## 2013-09-22 DIAGNOSIS — K219 Gastro-esophageal reflux disease without esophagitis: Secondary | ICD-10-CM | POA: Insufficient documentation

## 2013-09-22 DIAGNOSIS — R45851 Suicidal ideations: Secondary | ICD-10-CM

## 2013-09-22 DIAGNOSIS — E119 Type 2 diabetes mellitus without complications: Secondary | ICD-10-CM | POA: Insufficient documentation

## 2013-09-22 DIAGNOSIS — Z794 Long term (current) use of insulin: Secondary | ICD-10-CM | POA: Insufficient documentation

## 2013-09-22 LAB — CBG MONITORING, ED
GLUCOSE-CAPILLARY: 138 mg/dL — AB (ref 70–99)
Glucose-Capillary: 349 mg/dL — ABNORMAL HIGH (ref 70–99)
Glucose-Capillary: 142 mg/dL — ABNORMAL HIGH (ref 70–99)

## 2013-09-22 LAB — ACETAMINOPHEN LEVEL

## 2013-09-22 LAB — CBC
HCT: 45.6 % (ref 36.0–46.0)
Hemoglobin: 15.9 g/dL — ABNORMAL HIGH (ref 12.0–15.0)
MCH: 35.7 pg — ABNORMAL HIGH (ref 26.0–34.0)
MCHC: 34.9 g/dL (ref 30.0–36.0)
MCV: 102.2 fL — AB (ref 78.0–100.0)
Platelets: 62 10*3/uL — ABNORMAL LOW (ref 150–400)
RBC: 4.46 MIL/uL (ref 3.87–5.11)
RDW: 13.4 % (ref 11.5–15.5)
WBC: 3.8 10*3/uL — ABNORMAL LOW (ref 4.0–10.5)

## 2013-09-22 LAB — COMPREHENSIVE METABOLIC PANEL
ALK PHOS: 157 U/L — AB (ref 39–117)
ALT: 161 U/L — AB (ref 0–35)
AST: 234 U/L — ABNORMAL HIGH (ref 0–37)
Albumin: 3.7 g/dL (ref 3.5–5.2)
BUN: 5 mg/dL — ABNORMAL LOW (ref 6–23)
CALCIUM: 9 mg/dL (ref 8.4–10.5)
CO2: 27 mEq/L (ref 19–32)
Chloride: 95 mEq/L — ABNORMAL LOW (ref 96–112)
Creatinine, Ser: 0.47 mg/dL — ABNORMAL LOW (ref 0.50–1.10)
GFR calc Af Amer: >90 mL/min (ref >90)
Glucose, Bld: 334 mg/dL — ABNORMAL HIGH (ref 70–99)
Potassium: 4.1 mEq/L (ref 3.7–5.3)
SODIUM: 138 meq/L (ref 137–147)
Total Bilirubin: 1.5 mg/dL — ABNORMAL HIGH (ref 0.3–1.2)
Total Protein: 9.4 g/dL — ABNORMAL HIGH (ref 6.0–8.3)

## 2013-09-22 LAB — RAPID URINE DRUG SCREEN, HOSP PERFORMED
Amphetamines: NOT DETECTED
BARBITURATES: NOT DETECTED
Benzodiazepines: NOT DETECTED
COCAINE: NOT DETECTED
Opiates: NOT DETECTED
Tetrahydrocannabinol: NOT DETECTED

## 2013-09-22 LAB — ETHANOL: Alcohol, Ethyl (B): 336 mg/dL — ABNORMAL HIGH (ref 0–11)

## 2013-09-22 LAB — SALICYLATE LEVEL: Salicylate Lvl: <2 mg/dL — ABNORMAL LOW (ref 2.8–20.0)

## 2013-09-22 LAB — LIPASE, BLOOD: LIPASE: 72 U/L — AB (ref 11–59)

## 2013-09-22 MED ORDER — LORAZEPAM 1 MG PO TABS: 0.0000 mg | ORAL_TABLET | Freq: Two times a day (BID) | ORAL | Status: DC

## 2013-09-22 MED ORDER — METFORMIN HCL 500 MG PO TABS
1000.0000 mg | ORAL_TABLET | Freq: Two times a day (BID) | ORAL | Status: DC
  Filled 2013-09-22: qty 2

## 2013-09-22 MED ORDER — INSULIN GLARGINE 100 UNIT/ML ~~LOC~~ SOLN
10.0000 [IU] | Freq: Every day | SUBCUTANEOUS | Status: DC
  Administered 2013-09-22: 10 [IU] via SUBCUTANEOUS
  Filled 2013-09-22 (×2): qty 0.1

## 2013-09-22 MED ORDER — INSULIN GLARGINE 100 UNIT/ML SOLOSTAR PEN: 10.0000 [IU] | PEN_INJECTOR | Freq: Every day | SUBCUTANEOUS | Status: DC

## 2013-09-22 MED ORDER — METFORMIN HCL 500 MG PO TABS
1000.0000 mg | ORAL_TABLET | Freq: Two times a day (BID) | ORAL | Status: DC
  Administered 2013-09-23 (×2): 1000 mg via ORAL
  Filled 2013-09-22 (×3): qty 2

## 2013-09-22 MED ORDER — PANTOPRAZOLE SODIUM 40 MG PO TBEC
40.0000 mg | DELAYED_RELEASE_TABLET | Freq: Every day | ORAL | Status: DC
  Administered 2013-09-22 – 2013-09-23 (×2): 40 mg via ORAL
  Filled 2013-09-22 (×2): qty 1

## 2013-09-22 MED ORDER — LORAZEPAM 1 MG PO TABS
0.0000 mg | ORAL_TABLET | Freq: Four times a day (QID) | ORAL | Status: DC
Start: 2013-09-23 — End: 2013-09-23
  Administered 2013-09-23 (×2): 2 mg via ORAL
  Filled 2013-09-22 (×2): qty 2

## 2013-09-22 MED ORDER — THIAMINE HCL 100 MG/ML IJ SOLN: 100.0000 mg | Freq: Every day | INTRAMUSCULAR | Status: DC

## 2013-09-22 MED ORDER — VITAMIN B-1 100 MG PO TABS
100.0000 mg | ORAL_TABLET | Freq: Every day | ORAL | Status: DC
  Administered 2013-09-22 – 2013-09-23 (×2): 100 mg via ORAL
  Filled 2013-09-22 (×3): qty 1

## 2013-09-22 MED ORDER — INSULIN ASPART 100 UNIT/ML ~~LOC~~ SOLN
8.0000 [IU] | Freq: Once | SUBCUTANEOUS | Status: AC
  Administered 2013-09-22: 8 [IU] via SUBCUTANEOUS
  Filled 2013-09-22: qty 1

## 2013-09-22 MED ORDER — SODIUM CHLORIDE 0.9 % IV BOLUS (SEPSIS)
1000.0000 mL | Freq: Once | INTRAVENOUS | Status: AC
  Administered 2013-09-22: 1000 mL via INTRAVENOUS

## 2013-09-22 NOTE — ED Notes (Signed)
Poison control called for an update
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
## 2013-09-22 NOTE — ED Notes (Signed)
Spoke to [place] w/ poison control.  Sts drowsiness, dizziness, tachycardia, and hypertension to be expected. Recommends typical labs, ekg, monitoring for 6-8 hrs, and CBG.

## 2013-09-22 NOTE — ED Provider Notes (Signed)
CSN: [id number]     Arrival date & time 09/22/13  1508 History   First MD Initiated Contact with Patient 09/22/13 1526     Chief Complaint  Patient presents with  . Ingestion     (Consider location/radiation/quality/duration/timing/severity/associated sxs/prior Treatment) Patient is a 48 y.o. female presenting with Ingested Medication. The history is provided by the patient and the EMS personnel. The history is limited by the condition of the patient.  Ingestion Pertinent negatives include no chest pain, no abdominal pain, no headaches and no shortness of breath.  pt w hx etoh abuse, anxiety, depression, presents after overdose at approximately 1 pm today.  Pt w anxiety, depression, suicidal thoughts after argument with spouse.  Pt states had taken approximately 10-15 of either her prozac or zoloft.  Also consumed etoh. Denies other ingestion. No nv. No faintness or dizziness. Pt awake and alert - limited historian - level 5 caveat, overdose/psychiatric illness.     Past Medical History  Diagnosis Date  . Impaired glucose tolerance 11/07/2010  . Hepatitis C approx dx 2005    no tx to date  . Pancreatitis     HX of  . Alcohol dependency   . GERD (gastroesophageal reflux disease)   . Chronic constipation   . Gallstones   . Drug dependency     Hx of cocaine use  . Depression   . Rheumatoid factor positive   . Hemochromatosis     last phlebotomy tx 2-3 yrs  . Pancytopenia   . Abnormal vaginal bleeding     uterine fibroid  . Gallstone 11/09/2010  . Chronic abdominal pain 11/09/2010  . Cirrhosis 11/09/2010  . Type II or unspecified type diabetes mellitus without mention of complication, uncontrolled 09/17/2011  . Anxiety 09/17/2011   Past Surgical History  Procedure Laterality Date  . Appendectomy    . Cesarean section  x 2  . Foot surgery  2013  . Abdominal hysterectomy  12/17/2011    Procedure: HYSTERECTOMY ABDOMINAL;  Surgeon: Gus Height, MD;  Location: [facility];  Service:  Gynecology;  Laterality: N/A;  . Salpingoophorectomy  12/17/2011    Procedure: SALPINGO OOPHERECTOMY;  Surgeon: Gus Height, MD;  Location: [facility];  Service: Gynecology;  Laterality: Bilateral;   Family History  Problem Relation Age of Onset  . Cancer Mother     ovarian  . Diabetes Father    History  Substance Use Topics  . Smoking status: Former Research scientist (life sciences)  . Smokeless tobacco: Not on file  . Alcohol Use: Yes   OB History   Grav Para Term Preterm Abortions TAB SAB Ect Mult Living   2 2 2       3      Review of Systems  Constitutional: Negative for fever.  HENT: Negative for sore throat.   Eyes: Negative for redness.  Respiratory: Negative for shortness of breath.   Cardiovascular: Negative for chest pain.  Gastrointestinal: Negative for vomiting, abdominal pain and diarrhea.  Genitourinary: Negative for flank pain.  Musculoskeletal: Negative for back pain and neck pain.  Skin: Negative for rash.  Neurological: Negative for headaches.  Hematological: Does not bruise/bleed easily.  Psychiatric/Behavioral: Positive for suicidal ideas and dysphoric mood.      Allergies  Iodinated diagnostic agents  Home Medications   Prior to Admission medications   Medication Sig Start Date End Date Taking? Authorizing Provider  BD PEN NEEDLE NANO U/F 32G X 4 MM MISC 1 APPLICATION BY DOES NOT APPLY ROUTE 3 (THREE) TIMES DAILY.  Biagio Borg, MD  esomeprazole (NEXIUM) 40 MG capsule Take 1 capsule (40 mg total) by mouth daily as needed (heart burn). 04/13/13   Biagio Borg, MD  hydrOXYzine (VISTARIL) 100 MG capsule Take 1 capsule (100 mg total) by mouth at bedtime as needed for anxiety. 05/19/13   Stacy Gardner, PA-C  ibuprofen (ADVIL,MOTRIN) 800 MG tablet Take 1 tablet (800 mg total) by mouth every 8 (eight) hours as needed. 04/08/13   Clayton Bibles, PA-C  insulin aspart (NOVOLOG) 100 UNIT/ML injection Inject 2 Units into the skin 3 (three) times daily with meals. 06/22/13   Biagio Borg, MD   Insulin Glargine (LANTUS SOLOSTAR) 100 UNIT/ML Solostar Pen Inject 10 Units into the skin daily at 10 pm. 06/22/13   Biagio Borg, MD  linagliptin (TRADJENTA) 5 MG TABS tablet Take 5 mg by mouth daily. 05/29/12   Biagio Borg, MD  metFORMIN (GLUCOPHAGE) 1000 MG tablet Take 1,000 mg by mouth 2 (two) times daily with a meal.    Historical Provider, MD  Multiple Vitamin (MULTIVITAMIN WITH MINERALS) TABS Take 1 tablet by mouth daily.    Historical Provider, MD  NEXIUM 40 MG capsule TAKE 1 CAPSULE (40 MG TOTAL) BY MOUTH DAILY. 07/15/13   Biagio Borg, MD  NOVOLOG FLEXPEN 100 UNIT/ML FlexPen INJECT 2 UNITS INTO SKIN 3 TIMES DAILY BEFORE MEALS 06/04/13   Biagio Borg, MD  sertraline (ZOLOFT) 100 MG tablet 1 and 1/2 tabs by mouth per day 04/13/13   Biagio Borg, MD  traMADol (ULTRAM) 50 MG tablet Take 1 tablet (50 mg total) by mouth every 6 (six) hours as needed. 04/13/13   Biagio Borg, MD  zolpidem (AMBIEN) 10 MG tablet TAKE 1 TABLET AT BEDTIME AS NEEDED FOR SLEEP 04/13/13   Biagio Borg, MD   BP 123/89  Pulse 89  Temp(Src) 98.3 F (36.8 C) (Oral)  Resp 20  SpO2 92%  LMP 11/01/2011 Physical Exam  Nursing note and vitals reviewed. Constitutional: She appears well-developed and well-nourished. No distress.  HENT:  Head: Atraumatic.  Mouth/Throat: Oropharynx is clear and moist.  Eyes: Conjunctivae are normal. Pupils are equal, round, and reactive to light. No scleral icterus.  Neck: Normal range of motion. Neck supple. No tracheal deviation present.  Cardiovascular: Normal rate, regular rhythm, normal heart sounds and intact distal pulses.   Pulmonary/Chest: Effort normal and breath sounds normal. No respiratory distress.  Abdominal: Soft. Normal appearance and bowel sounds are normal. She exhibits no distension. There is no tenderness.  Musculoskeletal: She exhibits no edema and no tenderness.  Neurological: She is alert.  Motor intact bil. sens intact.   Skin: Skin is warm and dry. No rash noted.  She is not diaphoretic.  Psychiatric:  Depressed mood.     ED Course  Procedures (including critical care time) Labs Review  Results for orders placed during the hospital encounter of 09/22/13  CBC      Result Value Ref Range   WBC 3.8 (*) 4.0 - 10.5 K/uL   RBC 4.46  3.87 - 5.11 MIL/uL   Hemoglobin 15.9 (*) 12.0 - 15.0 g/dL   HCT 45.6  36.0 - 46.0 %   MCV 102.2 (*) 78.0 - 100.0 fL   MCH 35.7 (*) 26.0 - 34.0 pg   MCHC 34.9  30.0 - 36.0 g/dL   RDW 13.4  11.5 - 15.5 %   Platelets 62 (*) 150 - 400 K/uL  COMPREHENSIVE METABOLIC PANEL  Result Value Ref Range   Sodium 138  137 - 147 mEq/L   Potassium 4.1  3.7 - 5.3 mEq/L   Chloride 95 (*) 96 - 112 mEq/L   CO2 27  19 - 32 mEq/L   Glucose, Bld 334 (*) 70 - 99 mg/dL   BUN 5 (*) 6 - 23 mg/dL   Creatinine, Ser 0.47 (*) 0.50 - 1.10 mg/dL   Calcium 9.0  8.4 - 10.5 mg/dL   Total Protein 9.4 (*) 6.0 - 8.3 g/dL   Albumin 3.7  3.5 - 5.2 g/dL   AST 234 (*) 0 - 37 U/L   ALT 161 (*) 0 - 35 U/L   Alkaline Phosphatase 157 (*) 39 - 117 U/L   Total Bilirubin 1.5 (*) 0.3 - 1.2 mg/dL   GFR calc non Af Amer >90  >90 mL/min   GFR calc Af Amer >90  >90 mL/min  ETHANOL      Result Value Ref Range   Alcohol, Ethyl (B) 336 (*) 0 - 11 mg/dL  URINE RAPID DRUG SCREEN (HOSP PERFORMED)      Result Value Ref Range   Opiates NONE DETECTED  NONE DETECTED   Cocaine NONE DETECTED  NONE DETECTED   Benzodiazepines NONE DETECTED  NONE DETECTED   Amphetamines NONE DETECTED  NONE DETECTED   Tetrahydrocannabinol NONE DETECTED  NONE DETECTED   Barbiturates NONE DETECTED  NONE DETECTED  ACETAMINOPHEN LEVEL      Result Value Ref Range   Acetaminophen (Tylenol), Serum <15.0  10 - 30 ug/mL  SALICYLATE LEVEL      Result Value Ref Range   Salicylate Lvl <6.7 (*) 2.8 - 20.0 mg/dL  LIPASE, BLOOD      Result Value Ref Range   Lipase 72 (*) 11 - 59 U/L  CBG MONITORING, ED      Result Value Ref Range   Glucose-Capillary 349 (*) 70 - 99 mg/dL   Comment 1  Documented in Chart     Comment 2 Notify RN           EKG Interpretation   Date/Time:  Wednesday September 22 2013 15:14:52 EDT Ventricular Rate:  88 PR Interval:  146 QRS Duration: 90 QT Interval:  386 QTC Calculation: 467 R Axis:   61 Text Interpretation:  Sinus rhythm Baseline wander in lead(s) V6 Confirmed  by Ashok Cordia  MD, Lennette Bihari ([id number]) on 09/22/2013 3:31:10 PM      MDM   Iv ns. Continuous pulse ox and monitor.  o2 [place].   Reviewed nursing notes and prior charts for additional history.   Additional hx from ems.   Reviewed nursing notes and prior charts for additional history.   Glucose 349, iv ns bolus, insulin sq.  Approximately 8 hours post ingestion, vitals normal, pt awake and alert (poison control had recommending obs/vitals/monitor x 6 hrs).  Psych team consulted re eval and inpatient psych placement.      Mirna Mires, MD 09/22/13 2104

## 2013-09-22 NOTE — ED Notes (Signed)
Bed: RESB Expected date:  Expected time:  Means of arrival:  Comments: EMS- OD on Prozac

## 2013-09-22 NOTE — ED Notes (Signed)
CBG registered 142 on ED Glucometer.

## 2013-09-23 ENCOUNTER — Encounter (HOSPITAL_COMMUNITY): Payer: Self-pay | Admitting: Behavioral Health

## 2013-09-23 ENCOUNTER — Inpatient Hospital Stay (HOSPITAL_COMMUNITY)
Admission: AD | Admit: 2013-09-23 | Discharge: 2013-09-28 | DRG: 897 | Disposition: A | Discharge status: Home / Self Care | Payer: Federal, State, Local not specified - Other | Source: Intra-hospital | Attending: Psychiatry | Admitting: Psychiatry

## 2013-09-23 ENCOUNTER — Encounter (HOSPITAL_COMMUNITY): Payer: Self-pay | Admitting: Registered Nurse

## 2013-09-23 DIAGNOSIS — F332 Major depressive disorder, recurrent severe without psychotic features: Secondary | ICD-10-CM

## 2013-09-23 DIAGNOSIS — Z598 Other problems related to housing and economic circumstances: Secondary | ICD-10-CM

## 2013-09-23 DIAGNOSIS — E119 Type 2 diabetes mellitus without complications: Secondary | ICD-10-CM | POA: Diagnosis present

## 2013-09-23 DIAGNOSIS — F329 Major depressive disorder, single episode, unspecified: Secondary | ICD-10-CM | POA: Diagnosis present

## 2013-09-23 DIAGNOSIS — F1994 Other psychoactive substance use, unspecified with psychoactive substance-induced mood disorder: Secondary | ICD-10-CM | POA: Diagnosis present

## 2013-09-23 DIAGNOSIS — F1020 Alcohol dependence, uncomplicated: Principal | ICD-10-CM | POA: Diagnosis present

## 2013-09-23 DIAGNOSIS — T1491XA Suicide attempt, initial encounter: Secondary | ICD-10-CM | POA: Diagnosis present

## 2013-09-23 DIAGNOSIS — Z87891 Personal history of nicotine dependence: Secondary | ICD-10-CM

## 2013-09-23 DIAGNOSIS — K219 Gastro-esophageal reflux disease without esophagitis: Secondary | ICD-10-CM | POA: Diagnosis present

## 2013-09-23 DIAGNOSIS — Z833 Family history of diabetes mellitus: Secondary | ICD-10-CM

## 2013-09-23 DIAGNOSIS — G4700 Insomnia, unspecified: Secondary | ICD-10-CM | POA: Diagnosis present

## 2013-09-23 DIAGNOSIS — R45851 Suicidal ideations: Secondary | ICD-10-CM

## 2013-09-23 DIAGNOSIS — F411 Generalized anxiety disorder: Secondary | ICD-10-CM | POA: Diagnosis present

## 2013-09-23 DIAGNOSIS — G8929 Other chronic pain: Secondary | ICD-10-CM | POA: Diagnosis present

## 2013-09-23 DIAGNOSIS — Z794 Long term (current) use of insulin: Secondary | ICD-10-CM

## 2013-09-23 DIAGNOSIS — Z5987 Material hardship due to limited financial resources, not elsewhere classified: Secondary | ICD-10-CM

## 2013-09-23 DIAGNOSIS — R739 Hyperglycemia, unspecified: Secondary | ICD-10-CM

## 2013-09-23 DIAGNOSIS — F1010 Alcohol abuse, uncomplicated: Secondary | ICD-10-CM

## 2013-09-23 DIAGNOSIS — K7460 Unspecified cirrhosis of liver: Secondary | ICD-10-CM | POA: Diagnosis present

## 2013-09-23 LAB — CBG MONITORING, ED
Glucose-Capillary: 267 mg/dL — ABNORMAL HIGH (ref 70–99)
Glucose-Capillary: 167 mg/dL — ABNORMAL HIGH (ref 70–99)
Glucose-Capillary: 256 mg/dL — ABNORMAL HIGH (ref 70–99)

## 2013-09-23 LAB — GLUCOSE, CAPILLARY: GLUCOSE-CAPILLARY: 213 mg/dL — AB (ref 70–99)

## 2013-09-23 MED ORDER — METFORMIN HCL 500 MG PO TABS
1000.0000 mg | ORAL_TABLET | Freq: Two times a day (BID) | ORAL | Status: DC
  Administered 2013-09-24 – 2013-09-28 (×9): 1000 mg via ORAL
  Filled 2013-09-23 (×3): qty 2
  Filled 2013-09-23 (×3): qty 56
  Filled 2013-09-23 (×8): qty 2
  Filled 2013-09-23: qty 56
  Filled 2013-09-23: qty 2

## 2013-09-23 MED ORDER — PANTOPRAZOLE SODIUM 40 MG PO TBEC
40.0000 mg | DELAYED_RELEASE_TABLET | Freq: Every day | ORAL | Status: DC
  Filled 2013-09-23: qty 1

## 2013-09-23 MED ORDER — HYDROXYZINE HCL 50 MG PO TABS
50.0000 mg | ORAL_TABLET | Freq: Every day | ORAL | Status: DC
  Administered 2013-09-23 – 2013-09-27 (×5): 50 mg via ORAL
  Filled 2013-09-23 (×6): qty 1
  Filled 2013-09-23: qty 14
  Filled 2013-09-23: qty 1
  Filled 2013-09-23: qty 14

## 2013-09-23 MED ORDER — ONDANSETRON HCL 4 MG PO TABS
4.0000 mg | ORAL_TABLET | Freq: Three times a day (TID) | ORAL | Status: DC | PRN
  Administered 2013-09-23: 4 mg via ORAL
  Filled 2013-09-23: qty 1

## 2013-09-23 MED ORDER — LORAZEPAM 1 MG PO TABS
0.0000 mg | ORAL_TABLET | Freq: Two times a day (BID) | ORAL | Status: AC
  Administered 2013-09-25 – 2013-09-26 (×2): 2 mg via ORAL
  Administered 2013-09-26 – 2013-09-27 (×2): 1 mg via ORAL
  Filled 2013-09-23: qty 1
  Filled 2013-09-23: qty 2
  Filled 2013-09-23: qty 1
  Filled 2013-09-23: qty 2

## 2013-09-23 MED ORDER — IBUPROFEN 200 MG PO TABS
600.0000 mg | ORAL_TABLET | Freq: Three times a day (TID) | ORAL | Status: DC | PRN
  Administered 2013-09-23: 600 mg via ORAL
  Filled 2013-09-23 (×2): qty 3

## 2013-09-23 MED ORDER — INSULIN ASPART 100 UNIT/ML ~~LOC~~ SOLN
2.0000 [IU] | Freq: Three times a day (TID) | SUBCUTANEOUS | Status: DC
  Administered 2013-09-24 – 2013-09-28 (×13): 2 [IU] via SUBCUTANEOUS

## 2013-09-23 MED ORDER — INSULIN ASPART 100 UNIT/ML ~~LOC~~ SOLN
0.0000 [IU] | Freq: Three times a day (TID) | SUBCUTANEOUS | Status: DC
  Administered 2013-09-23: 3 [IU] via SUBCUTANEOUS
  Administered 2013-09-23: 08:00:00 via SUBCUTANEOUS
  Administered 2013-09-23: 8 [IU] via SUBCUTANEOUS
  Filled 2013-09-23 (×3): qty 1

## 2013-09-23 MED ORDER — IBUPROFEN 600 MG PO TABS
600.0000 mg | ORAL_TABLET | Freq: Four times a day (QID) | ORAL | Status: DC | PRN
  Administered 2013-09-23 – 2013-09-25 (×4): 600 mg via ORAL
  Filled 2013-09-23 (×4): qty 1

## 2013-09-23 MED ORDER — LORAZEPAM 1 MG PO TABS
0.0000 mg | ORAL_TABLET | Freq: Four times a day (QID) | ORAL | Status: AC
  Administered 2013-09-23: 2 mg via ORAL
  Administered 2013-09-24 (×3): 1 mg via ORAL
  Administered 2013-09-25: 2 mg via ORAL
  Filled 2013-09-23: qty 2
  Filled 2013-09-23 (×2): qty 1
  Filled 2013-09-23: qty 2
  Filled 2013-09-23: qty 1

## 2013-09-23 MED ORDER — MAGNESIUM HYDROXIDE 400 MG/5ML PO SUSP: 30.0000 mL | Freq: Every day | ORAL | Status: DC | PRN

## 2013-09-23 MED ORDER — INSULIN GLARGINE 100 UNIT/ML ~~LOC~~ SOLN
10.0000 [IU] | Freq: Every day | SUBCUTANEOUS | Status: DC
  Administered 2013-09-23 – 2013-09-27 (×5): 10 [IU] via SUBCUTANEOUS

## 2013-09-23 MED ORDER — ACETAMINOPHEN 325 MG PO TABS
650.0000 mg | ORAL_TABLET | ORAL | Status: DC | PRN
  Administered 2013-09-23: 650 mg via ORAL
  Filled 2013-09-23: qty 2

## 2013-09-23 MED ORDER — PANTOPRAZOLE SODIUM 40 MG PO TBEC
40.0000 mg | DELAYED_RELEASE_TABLET | Freq: Every day | ORAL | Status: DC
  Administered 2013-09-24 – 2013-09-28 (×5): 40 mg via ORAL
  Filled 2013-09-23 (×8): qty 1

## 2013-09-23 MED ORDER — INSULIN ASPART 100 UNIT/ML ~~LOC~~ SOLN
2.0000 [IU] | Freq: Three times a day (TID) | SUBCUTANEOUS | Status: DC
  Administered 2013-09-23 (×3): 2 [IU] via SUBCUTANEOUS

## 2013-09-23 MED ORDER — ALUM & MAG HYDROXIDE-SIMETH 200-200-20 MG/5ML PO SUSP: 30.0000 mL | ORAL | Status: DC | PRN

## 2013-09-23 MED ORDER — ZOLPIDEM TARTRATE 5 MG PO TABS
5.0000 mg | ORAL_TABLET | Freq: Every evening | ORAL | Status: DC | PRN
  Administered 2013-09-23: 5 mg via ORAL
  Filled 2013-09-23: qty 1

## 2013-09-23 MED ORDER — INSULIN ASPART 100 UNIT/ML ~~LOC~~ SOLN
0.0000 [IU] | Freq: Three times a day (TID) | SUBCUTANEOUS | Status: DC
  Administered 2013-09-24: 5 [IU] via SUBCUTANEOUS
  Administered 2013-09-24: 8 [IU] via SUBCUTANEOUS
  Administered 2013-09-24 – 2013-09-26 (×6): 3 [IU] via SUBCUTANEOUS
  Administered 2013-09-26: 2 [IU] via SUBCUTANEOUS
  Administered 2013-09-27: 11 [IU] via SUBCUTANEOUS
  Administered 2013-09-27: 8 [IU] via SUBCUTANEOUS
  Administered 2013-09-28: 2 [IU] via SUBCUTANEOUS
  Administered 2013-09-28: 3 [IU] via SUBCUTANEOUS
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
## 2013-09-23 NOTE — Progress Notes (Signed)
[facility] provided pt with a list of primary care resources, highlighting [facility]. Patient stated that she had [facility].
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
## 2013-09-23 NOTE — ED Notes (Signed)
Patient asleep. No further complaints of nausea or insomnia. Libby Maw, RN

## 2013-09-23 NOTE — ED Notes (Signed)
Pending [facility] transport at 8:15pm.

## 2013-09-23 NOTE — BHH Counselor (Signed)
Per Debar[facility], pt has been accepted to bed 508-2. Support paperwork signed and faxed to [facility]. Originals placed in pt's chart. Pt can be transported after 8 pm. Night shift RN will arranged transportation w/ [place], [place] Assessment Counselor

## 2013-09-23 NOTE — BH Assessment (Signed)
Per Florencia Reasons, [facility] at [facility], adult unit is at capacity. Contacted the following facilities for palcement:  AT CAPACITY:  [facility]: Per [place]: Per [place]: Per [facility]: Per [place]: Per [place]: Per Quenton Fetter: Per [facility]: Per [facility]: Per Malachy Mood  BED AVAILABLE. FAXED CLINICAL INFORMATION: Scharlene Gloss   [place], [place], [facility] Triage Specialist [phone]

## 2013-09-23 NOTE — ED Notes (Signed)
Pt wanded and belongings wanded by security. Report given to Psych ED. Belongings sent w/ her.
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
## 2013-09-23 NOTE — BHH Counselor (Addendum)
[facility] at capacity. Doug at [facility] - they don't have pt's referral. Writer refaxed referral.   Arnold Long, [place] Counselor [place] at [facility] - referral is currently under review. Deanna at [facility] - they are currently at capacity. They keep referrals for 72 hrs.   Arnold Long, [place] Assessment Counselor [phone]

## 2013-09-23 NOTE — Progress Notes (Signed)
[name] is a 48 year old female admitted voluntarily for OD attempt. Patient tried to overdose on 10 pills of Prozac. Patient also intoxicated during this time after an argument with her husband. She drinks about a pint of liquor a day. She denies any recent drug use but admits to cocaine use in the past. Last cocaine use was 2014. Pt has extensive medical history including DM (see medical history). Pt denies current SI/HI/AVH. Pt educated on unit rules and plan, verbalized understanding. Pt belongings searched and skin visually assessed. No complaints of pain or discomfort at this time. Pt remains safe on the unit. Will continue to monitor pt.

## 2013-09-24 DIAGNOSIS — F1020 Alcohol dependence, uncomplicated: Principal | ICD-10-CM

## 2013-09-24 DIAGNOSIS — F329 Major depressive disorder, single episode, unspecified: Secondary | ICD-10-CM

## 2013-09-24 LAB — GLUCOSE, CAPILLARY
GLUCOSE-CAPILLARY: 154 mg/dL — AB (ref 70–99)
GLUCOSE-CAPILLARY: 83 mg/dL (ref 70–99)
Glucose-Capillary: 259 mg/dL — ABNORMAL HIGH (ref 70–99)
Glucose-Capillary: 228 mg/dL — ABNORMAL HIGH (ref 70–99)

## 2013-09-24 MED ORDER — ADULT MULTIVITAMIN W/MINERALS CH
1.0000 | ORAL_TABLET | Freq: Every day | ORAL | Status: DC
  Administered 2013-09-24 – 2013-09-28 (×5): 1 via ORAL
  Filled 2013-09-24: qty 14
  Filled 2013-09-24 (×6): qty 1
  Filled 2013-09-24: qty 14
  Filled 2013-09-24: qty 1

## 2013-09-24 MED ORDER — SERTRALINE HCL 100 MG PO TABS
100.0000 mg | ORAL_TABLET | Freq: Every day | ORAL | Status: DC
  Administered 2013-09-24 – 2013-09-28 (×5): 100 mg via ORAL
  Filled 2013-09-24: qty 1
  Filled 2013-09-24: qty 14
  Filled 2013-09-24 (×2): qty 1
  Filled 2013-09-24: qty 14
  Filled 2013-09-24 (×4): qty 1

## 2013-09-24 MED ORDER — GLUCERNA SHAKE PO LIQD
237.0000 mL | Freq: Two times a day (BID) | ORAL | Status: DC
  Administered 2013-09-24 – 2013-09-28 (×7): 237 mL via ORAL

## 2013-09-24 MED ORDER — LINAGLIPTIN 5 MG PO TABS
5.0000 mg | ORAL_TABLET | Freq: Every day | ORAL | Status: DC
  Administered 2013-09-24 – 2013-09-28 (×5): 5 mg via ORAL
  Filled 2013-09-24: qty 1
  Filled 2013-09-24: qty 14
  Filled 2013-09-24 (×3): qty 1
  Filled 2013-09-24: qty 14
  Filled 2013-09-24 (×3): qty 1

## 2013-09-24 NOTE — Progress Notes (Signed)
D: Patient denies SI/HI and A/V hallucinations; patient reports some anxiety;   A: Monitored q 15 minutes; patient encouraged to attend groups; patient educated about medications; patient given medications per physician orders; patient encouraged to express feelings and/or concerns  R: Patient has been in the bed all day and did not even go down for meals; patient is exhibiting tremors; patient has been eating and drinking and vital signs are stable at this time

## 2013-09-24 NOTE — BHH Suicide Risk Assessment (Signed)
Suicide Risk Assessment  Admission Assessment     Nursing information obtained from:    Demographic factors:    Current Mental Status:    Loss Factors:    Historical Factors:    Risk Reduction Factors:    Total Time spent with patient: 45 minutes  CLINICAL FACTORS:   Alcohol/Substance Abuse/Dependencies  Psychiatric Specialty Exam:     Blood pressure 110/80, pulse 101, temperature 98.8 F (37.1 C), temperature source Oral, resp. rate 17, height 5\' 2"  (1.575 m), weight 59.875 kg (132 lb), last menstrual period 11/01/2011.Body mass index is 24.14 kg/(m^2).  General Appearance: Disheveled  Eye Contact::  Minimal  Speech:  Clear and Coherent, Slow and not spontaneous  Volume:  Decreased  Mood:  Depressed  Affect:  Restricted  Thought Process:  Coherent and Goal Directed  Orientation:  Full (Time, Place, and Person)  Thought Content:  events, symptoms, worries, concerns  Suicidal Thoughts:  No  Homicidal Thoughts:  No  Memory:  Immediate;   Fair Recent;   Fair Remote;   Fair  Judgement:  Fair  Insight:  Present and Shallow  Psychomotor Activity:  Decreased  Concentration:  Fair  Recall:  [employer] of Knowledge:NA  Language: Fair  Akathisia:  No  Handed:    AIMS (if indicated):     Assets:  Desire for Improvement Housing  Sleep:  Number of Hours: 5.5   Musculoskeletal: Strength & Muscle Tone: within normal limits Gait & Station: normal Patient leans: N/A  COGNITIVE FEATURES THAT CONTRIBUTE TO RISK:  Closed-mindedness Polarized thinking Thought constriction (tunnel vision)    SUICIDE RISK:   Moderate:  PLAN OF CARE: Supportive approach/coping skills/relapse prevention                               [facility] detox                               Reassess and address the co morbidities  I certify that inpatient services furnished can reasonably be expected to improve the patient's condition.  Nicholaus Bloom 09/24/2013, 4:40 PM

## 2013-09-24 NOTE — BHH Group Notes (Signed)
[place] LCSW Group Therapy  09/24/2013 2:14 PM  Type of Therapy:  Group Therapy  Participation Level:  Did Not Attend-pt sleeping in room.   Erin Obando Smart LCSWA  09/24/2013, 2:14 PM

## 2013-09-24 NOTE — BHH Counselor (Signed)
Adult Psychosocial Assessment Update Interdisciplinary Team  Previous [facility] admissions/discharges:  Admissions Discharges  Date: 12/16/12 Date: 12/19/12  Date: Date:  Date: Date:  Date: Date:  Date: Date:   Changes since the last Psychosocial Assessment (including adherence to outpatient mental health and/or substance abuse treatment, situational issues contributing to decompensation and/or relapse).  Patient had gotten angry with husband and took 10 of her prozac in an effort to kill herself. Patient does admit to trying to overdose after an argument with husband. She also admits to drinking (06/03) when she had tried to overdose. Patient was intoxicated when she tried to overdose. She says that at this time she has no SI and feels that she could be safe going home. He also says that she may benefit from being away from husband for a few days.  Patient drinks ETOH in the amount of about a pint of liquor every other day. Patient did drink earlier in the day and at the time of the overdose. Patient was at [facility] in February of 2015. She has been at [facility] back in 2013. Patient denies A/V hallucinations             Discharge Plan 1. Will you be returning to the same living situation after discharge?   Yes: No:      If no, what is your plan?    Pt plans to return home at d/c at this time.        2. Would you like a referral for services when you are discharged? Yes:     If yes, for what services?  No:       [facility]        Summary and Recommendations (to be completed by the evaluator) Pt is 48 year old female living in [place], [place] ([place]) with her husband. Pt presents Voluntarily to [facility] for ETOH detox, mood stabilization, SI with attempt to overdose, and medication management. Recommendations for pt include: crisis stabilization, therapeutic milieu, encourage group attendance and participation, ativan taper for withdrawals, medication  management for mood stabilization, and development of comprehensive mental wellness/sobriety plan. Pt denies SI/HI/AVH currently. Pt has [employer] and was last seen by provider in Feb 2015 ([place]). CSW assessing for appropriate referrals.                        Signature:  [place], [place] 09/24/2013 12:50 PM
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
## 2013-09-24 NOTE — BHH Group Notes (Signed)
[facility] LCSW Aftercare Discharge Planning Group Note   09/24/2013 9:25 AM  Participation Quality:  DID NOT ATTEND-pt in room sleeping.   [place]

## 2013-09-24 NOTE — Tx Team (Signed)
Interdisciplinary Treatment Plan Update (Adult)  Date: 09/24/2013   Time Reviewed: 9:53 AM  Progress in Treatment:  Attending groups: No.  Participating in groups:  No.  Taking medication as prescribed: Yes  Tolerating medication: Yes  Family/Significant othe contact made: Not yet. SPE required for this pt.   Patient understands diagnosis: Yes, AEB seeking treatment for ETOH detox, SI with attempted OD, mood stabilization, and med management.  Discussing patient identified problems/goals with staff: Yes  Medical problems stabilized or resolved: Yes  Denies suicidal/homicidal ideation: Yes, self report.  Patient has not harmed self or Others: Yes  New problem(s) identified:  Discharge Plan or Barriers: Pt currently not attending d/c planning. CSW assessing for appropriate referrals.  Additional comments: 300/02: [name] (ETOH detox/mood stabilization/med management): d/c saturday  Reason for Continuation of Hospitalization:  [name] is a 48 year old female admitted voluntarily for OD attempt. Patient tried to overdose on 10 pills of Prozac. Patient also intoxicated during this time after an argument with her husband. She drinks about a pint of liquor a day. She denies any recent drug use but admits to cocaine use in the past. Last cocaine use was 2014. Pt has extensive medical history including DM (see medical history). Pt denies current SI/HI/AVH. Pt educated on unit rules and plan, verbalized understanding. Pt belongings searched and skin visually assessed. No complaints of pain or discomfort at this time. Pt remains safe on the unit. Will continue to monitor pt.     Estimated length of stay: 3-5 days For review of initial/current patient goals, please see plan of care.  Attendees:  Patient:    Family:    Physician: Carlton Adam MD 09/24/2013 9:53 AM   Nursing:  09/24/2013 9:53 AM   Clinical Social Worker [place], [place]  09/24/2013 9:53 AM   Other:     Other:    Other:     Other:     Scribe for Treatment Team:  Maxie Better LCSWA 09/24/2013 9:53 AM

## 2013-09-24 NOTE — Progress Notes (Addendum)
Assumed patient's care at 0100. Pt observed resting in bed with eyes closed. RR WNL, even and unlabored. No acute distress. Level III obs in place for safety and pt is safe. [name]

## 2013-09-24 NOTE — Progress Notes (Signed)
Patient ID: [name], female   DOB: 05-13-1965, 48 y.o.   MRN: [id number] D. Resting in bed all evening. C/o abd pain. Tremors are present. Denied any nausea/vomiting. Stated she came into the hospital because she tried to kill herself by overdosing on her Prozac. Admits to being intoxicated at the time she ingested the pills. Presently she is minimizing her alcohol use and overdose attempt. Blames her husband for making her angry. A. Attempt made to encourage patient to attend evening AA/NA group. Met with patient to assess. CBG obtained. Fall risk plan reviewed. Administered HS medication and pain medication. R. The patient did not attend evening group. Rested in bed all evening. Ciwa=6, CBG=83. Rates abd pain 8/10. Denies suicidal ideation at present.

## 2013-09-24 NOTE — BHH Group Notes (Signed)
Adult Psychoeducational Group Note  Date:  09/24/2013 Time:  11:25 PM  Group Topic/Focus:  AA Meeting  Participation Level:  Did Not Attend  Participation Quality:  None  Affect:  None  Cognitive:  None  Insight: None  Engagement in Group:  None  Modes of Intervention:  Discussion and Education  Additional Comments:  Eleanore did not attend group.  Beryle Bagsby A Ria Comment 09/24/2013, 11:25 PM

## 2013-09-24 NOTE — Progress Notes (Signed)
NUTRITION ASSESSMENT  Pt identified as at risk on the Malnutrition Screen Tool  INTERVENTION: 1. Educated patient on the importance of nutrition and encouraged intake of food and beverages.  Educated on Diabetic diet basics using Plate method.  Plate method handout provided. 2. Discussed weight goals. 3. Supplements: MVI daily.  Glucerna bid  NUTRITION DIAGNOSIS: Unintentional weight loss related to sub-optimal intake as evidenced by pt report.   Goal: Pt to meet >/= 90% of their estimated nutrition needs.  Monitor:  PO intake  Assessment:  Patient admitted s/p OD.  Hx includes DM, cocaine, pancreatitis and alcohol abuse.  Patient reports poor appetite and intake for the past month and very poor for the past week.  UBW 160 lbs per patient but does not correlate with e-chart.  9 lb weight loss in the past 4 months.    48 y.o. female  Height: Ht Readings from Last 1 Encounters:  09/23/13 5\' 2"  (1.575 m)    Weight: Wt Readings from Last 1 Encounters:  09/23/13 132 lb (59.875 kg)    Weight Hx: Wt Readings from Last 10 Encounters:  09/23/13 132 lb (59.875 kg)  05/19/13 141 lb 1.9 oz (64.012 kg)  04/13/13 141 lb (63.957 kg)  04/08/13 139 lb (63.05 kg)  02/24/13 140 lb (63.504 kg)  02/18/13 142 lb 6 oz (64.581 kg)  01/23/13 134 lb 4.8 oz (60.918 kg)  01/13/13 139 lb 4 oz (63.163 kg)  12/16/12 130 lb (58.968 kg)  05/22/12 143 lb (64.864 kg)    BMI:  Body mass index is 24.14 kg/(m^2). Pt meets criteria for normal weight based on current BMI.  Estimated Nutritional Needs: Kcal: 25-30 kcal/kg Protein: > 1 gram protein/kg Fluid: 1 ml/kcal  Diet Order: Carb Control Pt is also offered choice of unit snacks mid-morning and mid-afternoon.  Pt is eating as desired.   Lab results and medications reviewed.   Antonieta Iba, RD, LDN Clinical Inpatient Dietitian Pager:  [phone] Weekend and after hours pager:  [phone]

## 2013-09-25 DIAGNOSIS — F332 Major depressive disorder, recurrent severe without psychotic features: Secondary | ICD-10-CM

## 2013-09-25 LAB — GLUCOSE, CAPILLARY
GLUCOSE-CAPILLARY: 170 mg/dL — AB (ref 70–99)
GLUCOSE-CAPILLARY: 150 mg/dL — AB (ref 70–99)
Glucose-Capillary: 165 mg/dL — ABNORMAL HIGH (ref 70–99)

## 2013-09-25 NOTE — BHH Group Notes (Signed)
[facility] Group Notes: (Clinical Social Work)   09/25/2013      Type of Therapy:  Group Therapy   Participation Level:  Did Not Attend    Selmer Dominion, LCSW 09/25/2013, 1:42 PM

## 2013-09-25 NOTE — BHH Group Notes (Signed)
[facility] Group Notes:  (Nursing/MHT/Case Management/Adjunct)  Date:  09/25/2013  Time:  3:09 PM  Type of Therapy:  Psychoeducational Skills  Participation Level:  Did Not Attend  Quentin Angst Brittanee Ghazarian 09/25/2013, 3:09 PM

## 2013-09-25 NOTE — BHH Group Notes (Signed)
[facility] Group Notes:  (Nursing/MHT/Case Management/Adjunct)  Date:  09/25/2013  Time:  9:40 AM  Type of Therapy:  Psychoeducational Skills  Participation Level:  Did Not Attend  [name] 09/25/2013, 9:40 AM

## 2013-09-25 NOTE — Progress Notes (Signed)
[facility] MD Progress Note  09/25/2013 11:46 AM [name]  MRN:  [id number] Subjective:  Caucasian female seen today on rounds states she is doing well.  Patient was brought in to our ER from home after attempting suicide by ingesting unknown amount of her Prozac.  Patient reported going through marital, financial stress and her depression was getting worse.  Today, patient is remorseful and stated she will not attempt suicide again.  Patient stated this is the first time she attempted suicide and that she was very depressed.  Patient reported  Good sleep but fair appetite.  Patient report keeping in touch with her husband.  She plans to participate in groups to learn coping skills.  She denied SI/HI/AVH and she does not have plans of hurting herself.  We will continue to offer her Ativan per our protocol.  We will continue to use our plan of care while discharge planning is in progress.  Diagnosis:  Alcohol Dependence, Major depressive d/o , recurrent, severe DSM5: Schizophrenia Disorders:  NA Obsessive-Compulsive Disorders:  NA Trauma-Stressor Disorders:  NA Substance/Addictive Disorders:  Alcohol related disorder, severe Depressive Disorders:  Major Depressive Disorder (296.99) Total Time spent with patient: 30 minutes  Axis I: Major Depression, Recurrent severe and Alcohol dependence Axis II: Deferred Axis III:  Past Medical History  Diagnosis Date  . Impaired glucose tolerance 11/07/2010  . Hepatitis C approx dx 2005    no tx to date  . Pancreatitis     HX of  . Alcohol dependency   . GERD (gastroesophageal reflux disease)   . Chronic constipation   . Gallstones   . Drug dependency     Hx of cocaine use  . Depression   . Rheumatoid factor positive   . Hemochromatosis     last phlebotomy tx 2-3 yrs  . Pancytopenia   . Abnormal vaginal bleeding     uterine fibroid  . Gallstone 11/09/2010  . Chronic abdominal pain 11/09/2010  . Cirrhosis 11/09/2010  . Type II or unspecified type  diabetes mellitus without mention of complication, uncontrolled 09/17/2011  . Anxiety 09/17/2011   Axis IV: other psychosocial or environmental problems and problems related to social environment Axis V: 41-50 serious symptoms  ADL's:  Intact  Sleep: Good  Appetite:  Fair  Suicidal Ideation:  Plan:  DENIED Intent:  DENIED Means:  DENIED Homicidal Ideation:  Plan:  DENIED Intent:  DENIED Means:  DENIED AEB (as evidenced by):  Psychiatric Specialty Exam: Physical Exam  ROS  Blood pressure 115/79, pulse 87, temperature 98.2 F (36.8 C), temperature source Oral, resp. rate 16, height 5\' 2"  (1.575 m), weight 59.875 kg (132 lb), last menstrual period 11/01/2011.Body mass index is 24.14 kg/(m^2).  General Appearance: Casual  Eye Contact::  Good  Speech:  Clear and Coherent and Normal Rate  Volume:  Normal  Mood:  Depressed  Affect:  Depressed and Flat  Thought Process:  Coherent, Goal Directed and Intact  Orientation:  Full (Time, Place, and Person)  Thought Content:  WDL  Suicidal Thoughts:  No  Homicidal Thoughts:  No  Memory:  Immediate;   Good Recent;   Good Remote;   Good  Judgement:  Poor  Insight:  Fair  Psychomotor Activity:  Normal and Tremor  Concentration:  Fair  Recall:  NA  Fund of Knowledge:Good  Language: Good  Akathisia:  NA  Handed:  Right  AIMS (if indicated):     Assets:  Desire for Improvement  Sleep:  Number of  Hours: 6.75   Musculoskeletal: Strength & Muscle Tone: within normal limits Gait & Station: normal Patient leans: N/A  Current Medications: Current Facility-Administered Medications  Medication Dose Route Frequency Provider Last Rate Last Dose  . alum & mag hydroxide-simeth (MAALOX/MYLANTA) 200-200-20 MG/5ML suspension 30 mL  30 mL Oral Q4H PRN Shuvon Rankin, NP      . feeding supplement (GLUCERNA SHAKE) (GLUCERNA SHAKE) liquid 237 mL  237 mL Oral BID BM Darrol Jump, RD   237 mL at 09/25/13 1007  . hydrOXYzine (ATARAX/VISTARIL)  tablet 50 mg  50 mg Oral QHS Lurena Nida, NP   50 mg at 09/24/13 2208  . ibuprofen (ADVIL,MOTRIN) tablet 600 mg  600 mg Oral Q6H PRN Lurena Nida, NP   600 mg at 09/25/13 1145  . insulin aspart (novoLOG) injection 0-15 Units  0-15 Units Subcutaneous TID WC Shuvon Rankin, NP   3 Units at 09/25/13 1143  . insulin aspart (novoLOG) injection 2 Units  2 Units Subcutaneous TID WC Shuvon Rankin, NP   2 Units at 09/25/13 1143  . insulin glargine (LANTUS) injection 10 Units  10 Units Subcutaneous QHS Shuvon Rankin, NP   10 Units at 09/24/13 2206  . linagliptin (TRADJENTA) tablet 5 mg  5 mg Oral Daily Encarnacion Slates, NP   5 mg at 09/25/13 1007  . LORazepam (ATIVAN) tablet 0-4 mg  0-4 mg Oral 4 times per day Shuvon Rankin, NP   2 mg at 09/25/13 1141   Followed by  . LORazepam (ATIVAN) tablet 0-4 mg  0-4 mg Oral Q12H Shuvon Rankin, NP      . magnesium hydroxide (MILK OF MAGNESIA) suspension 30 mL  30 mL Oral Daily PRN Shuvon Rankin, NP      . metFORMIN (GLUCOPHAGE) tablet 1,000 mg  1,000 mg Oral BID WC Shuvon Rankin, NP   1,000 mg at 09/25/13 1007  . multivitamin with minerals tablet 1 tablet  1 tablet Oral Daily Darrol Jump, RD   1 tablet at 09/25/13 1007  . pantoprazole (PROTONIX) EC tablet 40 mg  40 mg Oral Daily Lurena Nida, NP   40 mg at 09/25/13 1007  . sertraline (ZOLOFT) tablet 100 mg  100 mg Oral Daily Encarnacion Slates, NP   100 mg at 09/25/13 1006    Lab Results:  Results for orders placed during the hospital encounter of 09/23/13 (from the past 48 hour(s))  GLUCOSE, CAPILLARY     Status: Abnormal   Collection Time    09/23/13  9:07 PM      Result Value Ref Range   Glucose-Capillary 213 (*) 70 - 99 mg/dL  GLUCOSE, CAPILLARY     Status: Abnormal   Collection Time    09/24/13  6:14 AM      Result Value Ref Range   Glucose-Capillary 259 (*) 70 - 99 mg/dL  GLUCOSE, CAPILLARY     Status: Abnormal   Collection Time    09/24/13 11:31 AM      Result Value Ref Range   Glucose-Capillary 154  (*) 70 - 99 mg/dL  GLUCOSE, CAPILLARY     Status: Abnormal   Collection Time    09/24/13  4:57 PM      Result Value Ref Range   Glucose-Capillary 228 (*) 70 - 99 mg/dL   Comment 1 Notify RN    GLUCOSE, CAPILLARY     Status: None   Collection Time    09/24/13  9:09 PM  Result Value Ref Range   Glucose-Capillary 83  70 - 99 mg/dL   Comment 1 Notify RN     Comment 2 Documented in Chart    GLUCOSE, CAPILLARY     Status: Abnormal   Collection Time    09/25/13  6:13 AM      Result Value Ref Range   Glucose-Capillary 165 (*) 70 - 99 mg/dL  GLUCOSE, CAPILLARY     Status: Abnormal   Collection Time    09/25/13 11:22 AM      Result Value Ref Range   Glucose-Capillary 170 (*) 70 - 99 mg/dL   Comment 1 Notify RN      Physical Findings: AIMS: Facial and Oral Movements Muscles of Facial Expression: None, normal Lips and Perioral Area: None, normal Jaw: None, normal Tongue: None, normal,Extremity Movements Upper (arms, wrists, hands, fingers): None, normal Lower (legs, knees, ankles, toes): None, normal, Trunk Movements Neck, shoulders, hips: None, normal, Overall Severity Severity of abnormal movements (highest score from questions above): None, normal Incapacitation due to abnormal movements: None, normal Patient's awareness of abnormal movements (rate only patient's report): No Awareness, Dental Status Current problems with teeth and/or dentures?: No Does patient usually wear dentures?: No  CIWA:  CIWA-Ar Total: 0 COWS:  COWS Total Score: 0  Treatment Plan Summary: Daily contact with patient to assess and evaluate symptoms and progress in treatment Medication management  Plan: Plan: Continue with plan of care Continue crisis management Encourage to participate in group and individual sessions Continue medication management/ and review as needed Continue taking Sertraline 100 mg po daily for depression Continue Ativan protocol per our Alcohol withdrawing policy Continue  taking the rest of your medications for other medical condition Discharge  Plan in progress Address health issues /V/S as needed    Medical Decision Making Problem Points:  Established problem, stable/improving (1) Data Points:  Review and summation of old records (2) Review of medication regiment & side effects (2)  I certify that inpatient services furnished can reasonably be expected to improve the patient's condition.   Delfin Gant   PMHNP-BC 09/25/2013, 11:46 AM

## 2013-09-25 NOTE — Progress Notes (Signed)
Patient ID: [name], female   DOB: 1965/12/05, 47 y.o.   MRN: [id number] Patient currently asleep; no s/s of distress noted at this time. Respirations regular and unlabored. Q 15 minute safety checks maintained per protocol.

## 2013-09-25 NOTE — Progress Notes (Signed)
Patient did not attend the evening speaker AA meeting. Pt was notified that group was beginning but remained in bed.   

## 2013-09-25 NOTE — Progress Notes (Signed)
Patient ID: [name], female   DOB: [birth year], 48 y.o.   MRN: [id number]  D: Pt has been very flat and depressed on the unit today, she was in the bed most of the day. Pt did not attend any groups, nor did she engage in treatment. Pt refused to complete her self inventory sheet, she reported that she was fine. Pt reported no issues or concerns, and reported that she had no issues with her medication regimen. Pt reported being negative SI/HI, no AH/VH noted. A: 15 min checks continued for patient safety. R: Pt safety maintained.

## 2013-09-25 NOTE — Progress Notes (Signed)
D.  Pt remained in bed during group, minimal interaction on unit.  Denies SI/HI/hallucinations at this time.  A.  Support and encouragement offered, medications given as ordered  R.  Pt remains safe on unit, will continue to monitor.

## 2013-09-26 LAB — GLUCOSE, CAPILLARY
GLUCOSE-CAPILLARY: 133 mg/dL — AB (ref 70–99)
Glucose-Capillary: 175 mg/dL — ABNORMAL HIGH (ref 70–99)
Glucose-Capillary: 184 mg/dL — ABNORMAL HIGH (ref 70–99)
Glucose-Capillary: 182 mg/dL — ABNORMAL HIGH (ref 70–99)
Glucose-Capillary: 231 mg/dL — ABNORMAL HIGH (ref 70–99)

## 2013-09-26 NOTE — Progress Notes (Signed)
D.  Pt pleasant on approach, denies complaints at this time.  Positive for evening AA group with appropriate participation.  Interacting appropriately within milieu.  Pt has been up and visible, well groomed this evening.  Denies SI/HI/hallucinations at this time.  A.  Support and encouragement offered  R.  Pt remains safe on unit, will continue to monitor.

## 2013-09-26 NOTE — B[facility] Group Notes (Signed)
[facility] Group Notes: (Clinical Social Work)   09/26/2013      Type of Therapy:  Group Therapy   Participation Level:  Did Not Attend    Selmer Dominion, LCSW 09/26/2013, 12:03 PM

## 2013-09-26 NOTE — Progress Notes (Signed)
Psychoeducational Group Note  Date:  09/26/2013 Time:  1315  Group Topic/Focus:  Making Healthy Choices:   The focus of this group is to help patients identify negative/unhealthy choices they were using prior to admission and identify positive/healthier coping strategies to replace them upon discharge.  Participation Level:  Active  Participation Quality:  Appropriate  Affect:  Appropriate  Cognitive:  Alert  Insight:  Developing/Improving  Engagement in Group:  Developing/Improving  Additional Comments:    Migdalia Dk 09/26/2013, 1415

## 2013-09-26 NOTE — Progress Notes (Signed)
[facility] MD Progress Note 09/26/2013 9:51 AM [name]  MRN:  [id number] Subjective:  Patient was seen in bed this am and she reported mild tremors.   She missed the group therapy going on this am.  Patient was encouraged to attend group as part of treatment and learning coping skills.  Patient promises to attend groups today a soon as she is fully awake.  She reported poor appetite but stated she is getting Ensure drink as  supplements with meal. She reported her depression is 5/10 with 10 being severe depression.  She denied SI/HI/AVH and she does not have plans of hurting herself.  We will continue to offer her Ativan per our protocol.  We will continue to use our plan of care while discharge planning is in progress.  Diagnosis:  Alcohol Dependence, Major depressive d/o , recurrent, severe DSM5: Schizophrenia Disorders:  NA Obsessive-Compulsive Disorders:  NA Trauma-Stressor Disorders:  NA Substance/Addictive Disorders:  Alcohol related disorder, severe Depressive Disorders:  Major Depressive Disorder (296.99) Total Time spent with patient: 30 minutes  Axis I: Major Depression, Recurrent severe and Alcohol dependence Axis II: Deferred Axis III:  Past Medical History  Diagnosis Date  . Impaired glucose tolerance 11/07/2010  . Hepatitis C approx dx 2005    no tx to date  . Pancreatitis     HX of  . Alcohol dependency   . GERD (gastroesophageal reflux disease)   . Chronic constipation   . Gallstones   . Drug dependency     Hx of cocaine use  . Depression   . Rheumatoid factor positive   . Hemochromatosis     last phlebotomy tx 2-3 yrs  . Pancytopenia   . Abnormal vaginal bleeding     uterine fibroid  . Gallstone 11/09/2010  . Chronic abdominal pain 11/09/2010  . Cirrhosis 11/09/2010  . Type II or unspecified type diabetes mellitus without mention of complication, uncontrolled 09/17/2011  . Anxiety 09/17/2011   Axis IV: other psychosocial or environmental problems and problems  related to social environment Axis V: 41-50 serious symptoms  ADL's:  Intact  Sleep: Good  Appetite:  Fair  Suicidal Ideation:  Plan:  DENIED Intent:  DENIED Means:  DENIED Homicidal Ideation:  Plan:  DENIED Intent:  DENIED Means:  DENIED AEB (as evidenced by):  Psychiatric Specialty Exam: Physical Exam  ROS  Blood pressure 105/73, pulse 97, temperature 98.5 F (36.9 C), temperature source Oral, resp. rate 16, height 5\' 2"  (1.575 m), weight 59.875 kg (132 lb), last menstrual period 11/01/2011.Body mass index is 24.14 kg/(m^2).  General Appearance: Casual  Eye Contact::  Good  Speech:  Clear and Coherent and Normal Rate  Volume:  Normal  Mood:  Depressed  Affect:  Depressed and Flat  Thought Process:  Coherent, Goal Directed and Intact  Orientation:  Full (Time, Place, and Person)  Thought Content:  WDL  Suicidal Thoughts:  No  Homicidal Thoughts:  No  Memory:  Immediate;   Good Recent;   Good Remote;   Good  Judgement:  Poor  Insight:  Fair  Psychomotor Activity:  Normal and Tremor  Concentration:  Fair  Recall:  NA  Fund of Knowledge:Good  Language: Good  Akathisia:  NA  Handed:  Right  AIMS (if indicated):     Assets:  Desire for Improvement  Sleep:  Number of Hours: 6.5   Musculoskeletal: Strength & Muscle Tone: within normal limits Gait & Station: normal Patient leans: N/A  Current Medications: Current Facility-Administered Medications  Medication Dose Route Frequency Provider Last Rate Last Dose  . alum & mag hydroxide-simeth (MAALOX/MYLANTA) 200-200-20 MG/5ML suspension 30 mL  30 mL Oral Q4H PRN Shuvon Rankin, NP      . feeding supplement (GLUCERNA SHAKE) (GLUCERNA SHAKE) liquid 237 mL  237 mL Oral BID BM Darrol Jump, RD   237 mL at 09/26/13 0821  . hydrOXYzine (ATARAX/VISTARIL) tablet 50 mg  50 mg Oral QHS Lurena Nida, NP   50 mg at 09/25/13 2130  . ibuprofen (ADVIL,MOTRIN) tablet 600 mg  600 mg Oral Q6H PRN Lurena Nida, NP   600 mg at  09/25/13 1145  . insulin aspart (novoLOG) injection 0-15 Units  0-15 Units Subcutaneous TID WC Shuvon Rankin, NP   2 Units at 09/26/13 0700  . insulin aspart (novoLOG) injection 2 Units  2 Units Subcutaneous TID WC Shuvon Rankin, NP   2 Units at 09/26/13 0700  . insulin glargine (LANTUS) injection 10 Units  10 Units Subcutaneous QHS Shuvon Rankin, NP   10 Units at 09/25/13 2132  . linagliptin (TRADJENTA) tablet 5 mg  5 mg Oral Daily Encarnacion Slates, NP   5 mg at 09/26/13 6387  . LORazepam (ATIVAN) tablet 0-4 mg  0-4 mg Oral Q12H Shuvon Rankin, NP   1 mg at 09/26/13 0821  . magnesium hydroxide (MILK OF MAGNESIA) suspension 30 mL  30 mL Oral Daily PRN Shuvon Rankin, NP      . metFORMIN (GLUCOPHAGE) tablet 1,000 mg  1,000 mg Oral BID WC Shuvon Rankin, NP   1,000 mg at 09/26/13 0821  . multivitamin with minerals tablet 1 tablet  1 tablet Oral Daily Darrol Jump, RD   1 tablet at 09/26/13 [phone]  . pantoprazole (PROTONIX) EC tablet 40 mg  40 mg Oral Daily Lurena Nida, NP   40 mg at 09/26/13 3295  . sertraline (ZOLOFT) tablet 100 mg  100 mg Oral Daily Encarnacion Slates, NP   100 mg at 09/26/13 1884    Lab Results:  Results for orders placed during the hospital encounter of 09/23/13 (from the past 48 hour(s))  GLUCOSE, CAPILLARY     Status: Abnormal   Collection Time    09/24/13 11:31 AM      Result Value Ref Range   Glucose-Capillary 154 (*) 70 - 99 mg/dL  GLUCOSE, CAPILLARY     Status: Abnormal   Collection Time    09/24/13  4:57 PM      Result Value Ref Range   Glucose-Capillary 228 (*) 70 - 99 mg/dL   Comment 1 Notify RN    GLUCOSE, CAPILLARY     Status: None   Collection Time    09/24/13  9:09 PM      Result Value Ref Range   Glucose-Capillary 83  70 - 99 mg/dL   Comment 1 Notify RN     Comment 2 Documented in Chart    GLUCOSE, CAPILLARY     Status: Abnormal   Collection Time    09/25/13  6:13 AM      Result Value Ref Range   Glucose-Capillary 165 (*) 70 - 99 mg/dL  GLUCOSE, CAPILLARY      Status: Abnormal   Collection Time    09/25/13 11:22 AM      Result Value Ref Range   Glucose-Capillary 170 (*) 70 - 99 mg/dL   Comment 1 Notify RN    GLUCOSE, CAPILLARY     Status: Abnormal   Collection Time  09/25/13  4:38 PM      Result Value Ref Range   Glucose-Capillary 150 (*) 70 - 99 mg/dL  GLUCOSE, CAPILLARY     Status: Abnormal   Collection Time    09/25/13  9:05 PM      Result Value Ref Range   Glucose-Capillary 133 (*) 70 - 99 mg/dL   Comment 1 Notify RN    GLUCOSE, CAPILLARY     Status: Abnormal   Collection Time    09/26/13  6:04 AM      Result Value Ref Range   Glucose-Capillary 175 (*) 70 - 99 mg/dL    Physical Findings: AIMS: Facial and Oral Movements Muscles of Facial Expression: None, normal Lips and Perioral Area: None, normal Jaw: None, normal Tongue: None, normal,Extremity Movements Upper (arms, wrists, hands, fingers): None, normal Lower (legs, knees, ankles, toes): None, normal, Trunk Movements Neck, shoulders, hips: None, normal, Overall Severity Severity of abnormal movements (highest score from questions above): None, normal Incapacitation due to abnormal movements: None, normal Patient's awareness of abnormal movements (rate only patient's report): No Awareness, Dental Status Current problems with teeth and/or dentures?: No Does patient usually wear dentures?: No  CIWA:  CIWA-Ar Total: 5 COWS:  COWS Total Score: 0  Treatment Plan Summary: Daily contact with patient to assess and evaluate symptoms and progress in treatment Medication management  Plan: Plan: Continue with plan of care Continue crisis management Encourage to participate in group and individual sessions Continue medication management/ and review as needed Continue taking Sertraline 100 mg po daily for depression Continue Ativan protocol per our Alcohol withdrawing policy Continue taking the rest of your medications for other medical condition Discharge  Plan in  progress Address health issues /V/S as needed    Medical Decision Making Problem Points:  Established problem, stable/improving (1) Data Points:  Review and summation of old records (2) Review of medication regiment & side effects (2)  I certify that inpatient services furnished can reasonably be expected to improve the patient's condition.   Delfin Gant   PMHNP-BC 09/26/2013, 9:51 AM

## 2013-09-26 NOTE — BHH Group Notes (Signed)
[facility] Group Notes:  (Nursing/MHT/Case Management/Adjunct)  Date:  09/26/2013  Time:  10:57 AM  Type of Therapy:  Psychoeducational Skills  Participation Level:  Did Not Attend  [name] [name] 09/26/2013, 10:57 AM

## 2013-09-26 NOTE — Progress Notes (Signed)
Patient ID: ETNA FORQUER, female   DOB: 1965-10-14, 48 y.o.   MRN: [id number]  D: Pt continues to be very flat and depressed on the unit. Pt did not attend any groups nor did she engage in any treatment. Pt has been very isolative on the unit, she does not interact much with her peers nor staff. Pt reported on her self inventory sheet that she her depression was a 4, and her helplessness was a 4. Pt reported being negative SI/HI, no AH/VH noted. A: 15 min checks continued for patient safety. R: Pt safety maintained.

## 2013-09-27 DIAGNOSIS — F1994 Other psychoactive substance use, unspecified with psychoactive substance-induced mood disorder: Secondary | ICD-10-CM

## 2013-09-27 LAB — GLUCOSE, CAPILLARY
GLUCOSE-CAPILLARY: 285 mg/dL — AB (ref 70–99)
GLUCOSE-CAPILLARY: 97 mg/dL (ref 70–99)
Glucose-Capillary: 346 mg/dL — ABNORMAL HIGH (ref 70–99)
Glucose-Capillary: 101 mg/dL — ABNORMAL HIGH (ref 70–99)
Glucose-Capillary: 61 mg/dL — ABNORMAL LOW (ref 70–99)

## 2013-09-27 NOTE — B[facility] Group Notes (Signed)
[facility] LCSW Group Therapy  09/27/2013 2:52 PM  Type of Therapy:  Group Therapy  Participation Level:  Minimal  Participation Quality:  Attentive  Affect:  Appropriate  Cognitive:  Alert and Oriented  Insight:  Engaged  Engagement in Therapy:  Engaged  Modes of Intervention:  Confrontation, Discussion, Education, Exploration, Problem-solving, Rapport Building, Socialization and Support  Summary of Progress/Problems: Today's Topic: Overcoming Obstacles. Pt identified obstacles faced currently and processed barriers involved in overcoming these obstacles. Pt identified steps necessary for overcoming these obstacles and explored motivation (internal and external) for facing these difficulties head on. Pt further identified one area of concern in their lives and chose a skill of focus pulled from their "toolbox."   [name] was attentive but minimally engaged in group discussion. She shared that her husband is an obstacle for her. "he drinks and has beer in the house." [name] shows some progress in the group setting and improving insight AEB her ability to process how talking to her husband about getting rid of the alcohol in the home will help her avoid being triggered and avoid relapse.    Kenyia W[place]olt Smart LCSWA  09/27/2013, 2:52 PM

## 2013-09-27 NOTE — Tx Team (Signed)
Interdisciplinary Treatment Plan Update (Adult)  Date: 09/27/2013   Time Reviewed: 9:42 AM  Progress in Treatment:  Attending groups: Yes   Participating in groups:  Yes .  Taking medication as prescribed: Yes  Tolerating medication: Yes  Family/Significant othe contact made: Not yet. SPE required for this pt.   Patient understands diagnosis: Yes, AEB seeking treatment for ETOH detox, SI with attempted OD, mood stabilization, and med management.  Discussing patient identified problems/goals with staff: Yes  Medical problems stabilized or resolved: Yes  Denies suicidal/homicidal ideation: Yes, self report.  Patient has not harmed self or Others: Yes  New problem(s) identified:  Discharge Plan or Barriers: Pt plans to return home at d/c and follow up with psychiatrist for med management. Pt reports that she is now interested in o/p therapy as well. CSW assessing for appropriate referrals.  Additional comments:   Reason for Continuation of Hospitalization:  Medication Management Mood stabilization    Estimated length of stay: 1 day (d/c scheduled for Tuesday) For review of initial/current patient goals, please see plan of care.  Attendees:  Patient:    Family:    Physician: Carlton Adam MD 09/27/2013 9:42 AM   Nursing: Chrys Racer RN 09/27/2013 9:42 AM   Clinical Social Worker [place] [place]  09/27/2013 9:42 AM   Other:  Butch Penny RN 09/27/2013 9:42AM  Other:    Other:     Other:    Scribe for Treatment Team:  Maxie Better [place] 09/27/2013 9:42 AM

## 2013-09-27 NOTE — Progress Notes (Signed)
Adult Psychoeducational Group Note  Date:  09/27/2013 Time:  9:40 PM  Group Topic/Focus:  Wrap-Up Group:   The focus of this group is to help patients review their daily goal of treatment and discuss progress on daily workbooks.  Participation Level:  Did Not Attend  Additional Comments:  Pt did not attend wrap up group,she was in bed sleeping during the time of group  Terrie Haring 09/27/2013, 9:40 PM

## 2013-09-27 NOTE — Progress Notes (Signed)
[facility] MD Progress Note  09/27/2013 6:14 PM [name]  MRN:  [id number] Subjective:  States that her husband drinks and becomes belligerent. States this triggers her. She realizes that she is the one having all these complications from the alcohol use. She is aware of her liver enzymes, lipase, CBC findings (all alcohol related) states she is committed to abstinence.  Diagnosis:   DSM5: Schizophrenia Disorders:  nonr Obsessive-Compulsive Disorders:  none Trauma-Stressor Disorders:  nonr Substance/Addictive Disorders:  Alcohol Related Disorder - Severe (303.90), Cocaine Use Disorder Depressive Disorders:  Major Depressive Disorder - Moderate (296.22) Total Time spent with patient: 30 minutes  Axis I: Substance Induced Mood Disorder  ADL's:  Intact  Sleep: Fair  Appetite:  Fair  Suicidal Ideation:  Plan:  denies Intent:  denies Means:  denies Homicidal Ideation:  Plan:  denies Intent:  denies Means:  denies AEB (as evidenced by):  Psychiatric Specialty Exam: Physical Exam  Review of Systems  Constitutional: Positive for malaise/fatigue.  HENT: Negative.   Eyes: Negative.   Respiratory: Negative.   Cardiovascular: Negative.   Gastrointestinal: Negative.   Genitourinary: Negative.   Musculoskeletal: Negative.   Skin: Negative.   Neurological: Negative.   Endo/Heme/Allergies: Negative.   Psychiatric/Behavioral: Positive for depression and substance abuse. The patient is nervous/anxious.     Blood pressure 101/76, pulse 98, temperature 98.4 F (36.9 C), temperature source Oral, resp. rate 16, height 5\' 2"  (1.575 m), weight 59.875 kg (132 lb), last menstrual period 11/01/2011.Body mass index is 24.14 kg/(m^2).  General Appearance: Fairly Groomed  Engineer, water::  Fair  Speech:  Clear and Coherent  Volume:  Normal  Mood:  Anxious and sad, worried  Affect:  Restricted  Thought Process:  Coherent and Goal Directed  Orientation:  Full (Time, Place, and Person)  Thought  Content:  events, symptoms, worries, concerns  Suicidal Thoughts:  No  Homicidal Thoughts:  No  Memory:  Immediate;   Fair Recent;   Fair Remote;   Fair  Judgement:  Fair  Insight:  Present and Shallow  Psychomotor Activity:  Normal  Concentration:  Fair  Recall:  [employer] of Knowledge:NA  Language: Fair  Akathisia:  No  Handed:    AIMS (if indicated):     Assets:  Desire for Improvement  Sleep:  Number of Hours: 5.75   Musculoskeletal: Strength & Muscle Tone: within normal limits Gait & Station: normal Patient leans: N/A  Current Medications: Current Facility-Administered Medications  Medication Dose Route Frequency Provider Last Rate Last Dose  . alum & mag hydroxide-simeth (MAALOX/MYLANTA) 200-200-20 MG/5ML suspension 30 mL  30 mL Oral Q4H PRN Shuvon Rankin, NP      . feeding supplement (GLUCERNA SHAKE) (GLUCERNA SHAKE) liquid 237 mL  237 mL Oral BID BM Darrol Jump, RD   237 mL at 09/27/13 1424  . hydrOXYzine (ATARAX/VISTARIL) tablet 50 mg  50 mg Oral QHS Lurena Nida, NP   50 mg at 09/26/13 2117  . ibuprofen (ADVIL,MOTRIN) tablet 600 mg  600 mg Oral Q6H PRN Lurena Nida, NP   600 mg at 09/25/13 1145  . insulin aspart (novoLOG) injection 0-15 Units  0-15 Units Subcutaneous TID WC Shuvon Rankin, NP   8 Units at 09/27/13 1708  . insulin aspart (novoLOG) injection 2 Units  2 Units Subcutaneous TID WC Shuvon Rankin, NP   2 Units at 09/27/13 1707  . insulin glargine (LANTUS) injection 10 Units  10 Units Subcutaneous QHS Shuvon Rankin, NP   10  Units at 09/26/13 2118  . linagliptin (TRADJENTA) tablet 5 mg  5 mg Oral Daily Encarnacion Slates, NP   5 mg at 09/27/13 0830  . magnesium hydroxide (MILK OF MAGNESIA) suspension 30 mL  30 mL Oral Daily PRN Shuvon Rankin, NP      . metFORMIN (GLUCOPHAGE) tablet 1,000 mg  1,000 mg Oral BID WC Shuvon Rankin, NP   1,000 mg at 09/27/13 1709  . multivitamin with minerals tablet 1 tablet  1 tablet Oral Daily Darrol Jump, RD   1 tablet at  09/27/13 0831  . pantoprazole (PROTONIX) EC tablet 40 mg  40 mg Oral Daily Lurena Nida, NP   40 mg at 09/27/13 0830  . sertraline (ZOLOFT) tablet 100 mg  100 mg Oral Daily Encarnacion Slates, NP   100 mg at 09/27/13 0830    Lab Results:  Results for orders placed during the hospital encounter of 09/23/13 (from the past 48 hour(s))  GLUCOSE, CAPILLARY     Status: Abnormal   Collection Time    09/25/13  9:05 PM      Result Value Ref Range   Glucose-Capillary 133 (*) 70 - 99 mg/dL   Comment 1 Notify RN    GLUCOSE, CAPILLARY     Status: Abnormal   Collection Time    09/26/13  6:04 AM      Result Value Ref Range   Glucose-Capillary 175 (*) 70 - 99 mg/dL  GLUCOSE, CAPILLARY     Status: Abnormal   Collection Time    09/26/13 11:38 AM      Result Value Ref Range   Glucose-Capillary 184 (*) 70 - 99 mg/dL   Comment 1 Notify RN    GLUCOSE, CAPILLARY     Status: Abnormal   Collection Time    09/26/13  4:42 PM      Result Value Ref Range   Glucose-Capillary 182 (*) 70 - 99 mg/dL   Comment 1 Notify RN    GLUCOSE, CAPILLARY     Status: Abnormal   Collection Time    09/26/13  9:10 PM      Result Value Ref Range   Glucose-Capillary 231 (*) 70 - 99 mg/dL   Comment 1 Notify RN     Comment 2 Documented in Chart    GLUCOSE, CAPILLARY     Status: Abnormal   Collection Time    09/27/13  6:08 AM      Result Value Ref Range   Glucose-Capillary 346 (*) 70 - 99 mg/dL  GLUCOSE, CAPILLARY     Status: Abnormal   Collection Time    09/27/13 11:42 AM      Result Value Ref Range   Glucose-Capillary 101 (*) 70 - 99 mg/dL  GLUCOSE, CAPILLARY     Status: Abnormal   Collection Time    09/27/13  4:58 PM      Result Value Ref Range   Glucose-Capillary 285 (*) 70 - 99 mg/dL    Physical Findings: AIMS: Facial and Oral Movements Muscles of Facial Expression: None, normal Lips and Perioral Area: None, normal Jaw: None, normal Tongue: None, normal,Extremity Movements Upper (arms, wrists, hands,  fingers): None, normal Lower (legs, knees, ankles, toes): None, normal, Trunk Movements Neck, shoulders, hips: None, normal, Overall Severity Severity of abnormal movements (highest score from questions above): None, normal Incapacitation due to abnormal movements: None, normal Patient's awareness of abnormal movements (rate only patient's report): No Awareness, Dental Status Current problems with teeth and/or dentures?: No  Does patient usually wear dentures?: No  CIWA:  CIWA-Ar Total: 5 COWS:  COWS Total Score: 0  Treatment Plan Summary: Daily contact with patient to assess and evaluate symptoms and progress in treatment Medication management  Plan: Supportive approach/coping skills/relapse prevention           CBT;mindfulness             Medical Decision Making Problem Points:  Review of psycho-social stressors (1) Data Points:  Review of medication regiment & side effects (2)  I certify that inpatient services furnished can reasonably be expected to improve the patient's condition.   Nicholaus Bloom 09/27/2013, 6:14 PM

## 2013-09-27 NOTE — [facility] Group Notes (Signed)
[facility] LCSW Aftercare Discharge Planning Group Note   09/27/2013 9:25 AM  Participation Quality:  Appropriate   Mood/Affect:  Appropriate  Depression Rating:  3  Anxiety Rating:  5  Thoughts of Suicide:  No Will you contract for safety?   NA  Current AVH:  No  Plan for Discharge/Comments:  Pt reports that she plans to return home with her husband. She had been seeing her PCP for mental health meds but is requesting referral for med management and "maybe even therapy." CSW assessing for appropriate referrals.   Transportation Means: husband (likely d/c Tues)  Supports: Husband/some family supports   [employer]

## 2013-09-27 NOTE — Progress Notes (Signed)
Patient ID: [name], female   DOB: 10-20-1965, 48 y.o.   MRN: [id number] She as been up and about most of the day, has been interacting with peers and staff more this afternoon. Self inventory from AM: Depression 3 hopelessness 2 withdrawals of agitation, tremors and she denies SI thoughts. Goal this AM was Plan not to drink and to exercise more.

## 2013-09-27 NOTE — Progress Notes (Signed)
Pt attended spiritual care group on grief and loss facilitated by chaplain Kinley Ferrentino   Group opened with brief discussion and psycho-social ed around grief and loss in relationships and in relation to self - identifying life patterns, circumstances, changes that cause losses. Established group norm of speaking from own life experience. Group goal of establishing open and affirming space for members to share loss and experience with grief, normalize grief experience and provide psycho social education and grief support.     

## 2013-09-27 NOTE — Progress Notes (Signed)
Patient did attend the evening speaker AA meeting.  

## 2013-09-28 LAB — GLUCOSE, CAPILLARY
Glucose-Capillary: 150 mg/dL — ABNORMAL HIGH (ref 70–99)
Glucose-Capillary: 162 mg/dL — ABNORMAL HIGH (ref 70–99)

## 2013-09-28 MED ORDER — INSULIN ASPART 100 UNIT/ML ~~LOC~~ SOLN: 2.0000 [IU] | Freq: Three times a day (TID) | SUBCUTANEOUS | Status: DC

## 2013-09-28 MED ORDER — INSULIN GLARGINE 100 UNIT/ML SOLOSTAR PEN: 10.0000 [IU] | PEN_INJECTOR | Freq: Every day | SUBCUTANEOUS | Status: DC

## 2013-09-28 MED ORDER — ADULT MULTIVITAMIN W/MINERALS CH: 1.0000 | ORAL_TABLET | Freq: Every day | ORAL | Status: DC

## 2013-09-28 MED ORDER — METFORMIN HCL 1000 MG PO TABS: 1000.0000 mg | ORAL_TABLET | Freq: Two times a day (BID) | ORAL | Status: DC

## 2013-09-28 MED ORDER — SERTRALINE HCL 100 MG PO TABS: 100.0000 mg | ORAL_TABLET | Freq: Every day | ORAL | Status: DC

## 2013-09-28 MED ORDER — HYDROXYZINE HCL 50 MG PO TABS: 50.0000 mg | ORAL_TABLET | Freq: Every day | ORAL | Status: DC

## 2013-09-28 MED ORDER — ESOMEPRAZOLE MAGNESIUM 40 MG PO CPDR: 40.0000 mg | DELAYED_RELEASE_CAPSULE | Freq: Every day | ORAL | Status: DC | PRN

## 2013-09-28 MED ORDER — LINAGLIPTIN 5 MG PO TABS: 5.0000 mg | ORAL_TABLET | Freq: Every day | ORAL | Status: DC

## 2013-09-28 NOTE — Progress Notes (Signed)
Adult Psychoeducational Group Note  Date:  09/28/2013 Time:  1:35 PM  Group Topic/Focus:  Recovery Goals:   The focus of this group is to identify appropriate goals for recovery and establish a plan to achieve them.  Participation Level:  Active  Participation Quality:  Appropriate  Affect:  Appropriate  Cognitive:  Appropriate  Insight: Appropriate and Good  Engagement in Group:  Engaged  Modes of Intervention:  Discussion  Additional Comments:    Cire Deyarmin A Seeley Hissong 09/28/2013, 1:35 PM

## 2013-09-28 NOTE — B[facility] Suicide Risk Assessment (Signed)
[place] INPATIENT:  Family/Significant Other Suicide Prevention Education  Suicide Prevention Education:  Education Completed; Cecille Rubin (pt's husband) [phone] has been identified by the patient as the family member/significant other with whom the patient will be residing, and identified as the person(s) who will aid the patient in the event of a mental health crisis (suicidal ideations/suicide attempt).  With written consent from the patient, the family member/significant other has been provided the following suicide prevention education, prior to the and/or following the discharge of the patient.  The suicide prevention education provided includes the following:  Suicide risk factors  Suicide prevention and interventions  National Suicide Hotline telephone number  [facility] assessment telephone number  [facility] Emergency Assistance [place] and/or Residential Mobile Crisis Unit telephone number  Request made of family/significant other to:  Remove weapons (e.g., guns, rifles, knives), all items previously/currently identified as safety concern.    Remove drugs/medications (over-the-counter, prescriptions, illicit drugs), all items previously/currently identified as a safety concern.  The family member/significant other verbalizes understanding of the suicide prevention education information provided.  The family member/significant other agrees to remove the items of safety concern listed above.  Paizlie Klaus Smart LCSWA 09/28/2013, 11:10 AM

## 2013-09-28 NOTE — BHH Group Notes (Signed)
[place] LCSW Group Therapy  09/28/2013 1:55 PM  Type of Therapy:  Group Therapy  Participation Level:  Active  Participation Quality:  Attentive  Affect:  Appropriate  Cognitive:  Alert and Oriented  Insight:  Engaged  Engagement in Therapy:  Engaged  Modes of Intervention:  Confrontation, Discussion, Education, Exploration, Problem-solving, Rapport Building, Socialization and Support  Summary of Progress/Problems: [facility] Speaker came to talk about his personal journey with substance abuse and addiction. The pt processed ways by which to relate to the speaker. [place] speaker provided handouts and educational information pertaining to groups and services offered by the [facility].    Alexei Doswell Smart LCSWA 09/28/2013, 1:55 PM

## 2013-09-28 NOTE — Discharge Summary (Signed)
Physician Discharge Summary Note  Patient:  [name] is an 48 y.o., female MRN:  [id number] DOB:  [birth year] Patient phone:  [phone] (home)  Patient address:   [address] [address],  Total Time spent with patient: Greater than 30 minutes  Date of Admission:  09/23/2013  Date of Discharge: 09/28/13  Reason for Admission: Alcohol detox  Discharge Diagnoses: Active Problems:   Alcohol dependence   MDD (major depressive disorder)   Psychiatric Specialty Exam: Physical Exam  Psychiatric: Her speech is normal and behavior is normal. Judgment and thought content normal. Her mood appears not anxious. Her affect is not angry, not blunt, not labile and not inappropriate. Cognition and memory are normal. She does not exhibit a depressed mood.    Review of Systems  Constitutional: Negative.   HENT: Negative.   Eyes: Negative.   Respiratory: Negative.   Cardiovascular: Negative.   Gastrointestinal: Negative.   Genitourinary: Negative.   Musculoskeletal: Negative.   Skin: Negative.   Neurological: Negative.   Endo/Heme/Allergies: Negative.   Psychiatric/Behavioral: Positive for depression (Stabilized with medication prior to discharge) and substance abuse (Alcoholism, chronic). Negative for suicidal ideas, hallucinations and memory loss. The patient is not nervous/anxious and does not have insomnia.     Blood pressure 96/67, pulse 80, temperature 97.9 F (36.6 C), temperature source Oral, resp. rate 18, height 5\' 2"  (1.575 m), weight 59.875 kg (132 lb), last menstrual period 11/01/2011.Body mass index is 24.14 kg/(m^2).   General Appearance: Fairly Groomed   Engineer, water:: Fair   Speech: Clear and Coherent   Volume: Normal   Mood: Euthymic   Affect: Appropriate   Thought Process: Coherent and Goal Directed   Orientation: Full (Time, Place, and Person)   Thought Content: events, symptoms, worries, concerns   Suicidal Thoughts: No   Homicidal  Thoughts: No   Memory: Immediate; Fair  Recent; Fair  Remote; Fair   Judgement: Fair   Insight: Present   Psychomotor Activity: Normal   Concentration: Fair   Recall: [employer] of Knowledge:NA   Language: Fair   Akathisia: No   Handed:   AIMS (if indicated):   Assets: Desire for Improvement  Housing   Sleep: Number of Hours: 6    Past Psychiatric History: Diagnosis: Alcohol dependence, MDD (major depressive disorder)  Hospitalizations: [facility] adult unit  Outpatient Care: [facility]  Substance Abuse Care: [facility]  Self-Mutilation: NA  Suicidal Attempts: NA  Violent Behaviors: NA   Musculoskeletal: Strength & Muscle Tone: within normal limits Gait & Station: normal Patient leans: N/A  DSM5: Schizophrenia Disorders:  NA Obsessive-Compulsive Disorders:  NA Trauma-Stressor Disorders:  NA Substance/Addictive Disorders:  Alcohol Related Disorder - Severe (303.90) Depressive Disorders:  MDD (major depressive disorder)  Axis Diagnosis:  AXIS I:  Alcohol dependence, MDD (major depressive disorder) AXIS II:  Deferred AXIS III:   Past Medical History  Diagnosis Date  . Impaired glucose tolerance 11/07/2010  . Hepatitis C approx dx 2005    no tx to date  . Pancreatitis     HX of  . Alcohol dependency   . GERD (gastroesophageal reflux disease)   . Chronic constipation   . Gallstones   . Drug dependency     Hx of cocaine use  . Depression   . Rheumatoid factor positive   . Hemochromatosis     last phlebotomy tx 2-3 yrs  . Pancytopenia   . Abnormal vaginal bleeding     uterine fibroid  .  Gallstone 11/09/2010  . Chronic abdominal pain 11/09/2010  . Cirrhosis 11/09/2010  . Type II or unspecified type diabetes mellitus without mention of complication, uncontrolled 09/17/2011  . Anxiety 09/17/2011   AXIS IV:  other psychosocial or environmental problems and Alcoholism AXIS V:  62  Level of Care:  OP  Hospital Course: Shandora is 48 years old, Caucasian female. She reports, "I  was taken to the hospital yesterday by the EMS. My husband has called them. I took some pills, bunch of pills. I was trying to kill myself. I'm dealing with a lot of problems, marital and financial. This is my first attempt. Been depressed for a while. Taking Sertraline for it. I drink as well, a pint of Vodka three times weekly. Been drinking since the age of 70. I guess I'm an alcoholic. Had treatment 2 years ago at the [facility]. Stayed sober x 9 months, relapsed, could not not tell why, other than personal problems".  Latanja was admitted to the hospital with a blood alcohol leel of 336. She also reported an attempted suicide by overdose due to some personal, financial and familial stressors. Carlea required detox treatment as well mood stabilization. She also presented with an elevated liver enzymes per her most recent lab reports. As a result, not a candidate for Librium detox protocols. Her detoxification treatment was achieved using Ativan detox regimen on a tapering dose format. This was used in place of Librium detox protocol because Librium is a long acting benzodiazepine with a long half life. If Librium was used for detox treatment in this case, will impose on a already compromised liver functions. This way, Dariana received a cleaner detoxification treatment without endangering her liver functions any further. She was also enrolled in the group counseling sessions, AA/NA meetings being offered and held on this unit.She participated and learned coping skills. Jalaiya received other medication regimen for her other medical conditions presented. She tolerated her treatment regimen without any significant adverse effects and or reactions. Besides the detox treatments received here and scheduled outpatient psychiatric services, Lataria also was ordered, received and discharged on Sertraline 100 mg daily for depression and Hydroxyzine 50 mg Q bedtime for sleep.   Darren has completed her detox treatment and  her mood is stable. This is evidenced by her reports of improved mood and absence of substance withdrawal symptoms. She will resume psychiatric care and routine medication management at the [facility] clinic here in [place], [place]. She was provided with all the necessary information required to make this appointment without problems. Upon discharge, Elyzabeth adamantly denies any suicidal, homicidal ideations, auditory, visual hallucinations, delusional thoughts, paranoia and or withdrawal symptoms. She left [facility] with all personal belongings in no apparent distress. She received a 14 days worth supply samples of her discharge medications provided by [facility] pharmacy. Transportation per husband.  Consults:  psychiatry  Significant Diagnostic Studies:  labs: CBC with diff, CMP, UDS, toxicology tests, U/A  Discharge Vitals:   Blood pressure 96/67, pulse 80, temperature 97.9 F (36.6 C), temperature source Oral, resp. rate 18, height 5\' 2"  (1.575 m), weight 59.875 kg (132 lb), last menstrual period 11/01/2011. Body mass index is 24.14 kg/(m^2). Lab Results:   Results for orders placed during the hospital encounter of 09/23/13 (from the past 72 hour(s))  GLUCOSE, CAPILLARY     Status: Abnormal   Collection Time    09/25/13  4:38 PM      Result Value Ref Range   Glucose-Capillary 150 (*) 70 - 99  mg/dL  GLUCOSE, CAPILLARY     Status: Abnormal   Collection Time    09/25/13  9:05 PM      Result Value Ref Range   Glucose-Capillary 133 (*) 70 - 99 mg/dL   Comment 1 Notify RN    GLUCOSE, CAPILLARY     Status: Abnormal   Collection Time    09/26/13  6:04 AM      Result Value Ref Range   Glucose-Capillary 175 (*) 70 - 99 mg/dL  GLUCOSE, CAPILLARY     Status: Abnormal   Collection Time    09/26/13 11:38 AM      Result Value Ref Range   Glucose-Capillary 184 (*) 70 - 99 mg/dL   Comment 1 Notify RN    GLUCOSE, CAPILLARY     Status: Abnormal   Collection Time    09/26/13  4:42 PM      Result Value Ref Range    Glucose-Capillary 182 (*) 70 - 99 mg/dL   Comment 1 Notify RN    GLUCOSE, CAPILLARY     Status: Abnormal   Collection Time    09/26/13  9:10 PM      Result Value Ref Range   Glucose-Capillary 231 (*) 70 - 99 mg/dL   Comment 1 Notify RN     Comment 2 Documented in Chart    GLUCOSE, CAPILLARY     Status: Abnormal   Collection Time    09/27/13  6:08 AM      Result Value Ref Range   Glucose-Capillary 346 (*) 70 - 99 mg/dL  GLUCOSE, CAPILLARY     Status: Abnormal   Collection Time    09/27/13 11:42 AM      Result Value Ref Range   Glucose-Capillary 101 (*) 70 - 99 mg/dL  GLUCOSE, CAPILLARY     Status: Abnormal   Collection Time    09/27/13  4:58 PM      Result Value Ref Range   Glucose-Capillary 285 (*) 70 - 99 mg/dL  GLUCOSE, CAPILLARY     Status: Abnormal   Collection Time    09/27/13  7:54 PM      Result Value Ref Range   Glucose-Capillary 61 (*) 70 - 99 mg/dL   Comment 1 Notify RN    GLUCOSE, CAPILLARY     Status: None   Collection Time    09/27/13  8:44 PM      Result Value Ref Range   Glucose-Capillary 97  70 - 99 mg/dL  GLUCOSE, CAPILLARY     Status: Abnormal   Collection Time    09/28/13  6:02 AM      Result Value Ref Range   Glucose-Capillary 150 (*) 70 - 99 mg/dL  GLUCOSE, CAPILLARY     Status: Abnormal   Collection Time    09/28/13 11:41 AM      Result Value Ref Range   Glucose-Capillary 162 (*) 70 - 99 mg/dL    Physical Findings: AIMS: Facial and Oral Movements Muscles of Facial Expression: None, normal Lips and Perioral Area: None, normal Jaw: None, normal Tongue: None, normal,Extremity Movements Upper (arms, wrists, hands, fingers): None, normal Lower (legs, knees, ankles, toes): None, normal, Trunk Movements Neck, shoulders, hips: None, normal, Overall Severity Severity of abnormal movements (highest score from questions above): None, normal Incapacitation due to abnormal movements: None, normal Patient's awareness of abnormal movements (rate  only patient's report): No Awareness, Dental Status Current problems with teeth and/or dentures?: No Does patient usually wear dentures?:  No  CIWA:  CIWA-Ar Total: 5 COWS:  COWS Total Score: 0  Psychiatric Specialty Exam: See Psychiatric Specialty Exam and Suicide Risk Assessment completed by Attending Physician prior to discharge.  Discharge destination:  Home  Is patient on multiple antipsychotic therapies at discharge:  No   Has Patient had three or more failed trials of antipsychotic monotherapy by history:  No  Recommended Plan for Multiple Antipsychotic Therapies: NA     Medication List    STOP taking these medications       hydrOXYzine 100 MG capsule  Commonly known as:  VISTARIL      TAKE these medications     Indication   esomeprazole 40 MG capsule  Commonly known as:  NEXIUM  Take 1 capsule (40 mg total) by mouth daily as needed (heart burn).   Indication:  Gastroesophageal Reflux Disease with Current Symptoms     hydrOXYzine 50 MG tablet  Commonly known as:  ATARAX/VISTARIL  Take 1 tablet (50 mg total) by mouth at bedtime. For tension/anxiety   Indication:  Tension, Anxiety     insulin aspart 100 UNIT/ML injection  Commonly known as:  novoLOG  Inject 2 Units into the skin 3 (three) times daily with meals. For diabetes   Indication:  Type 2 Diabetes     Insulin Glargine 100 UNIT/ML Solostar Pen  Commonly known as:  LANTUS SOLOSTAR  Inject 10 Units into the skin daily at 10 pm. For diabetes   Indication:  Type 2 Diabetes     linagliptin 5 MG Tabs tablet  Commonly known as:  TRADJENTA  Take 1 tablet (5 mg total) by mouth daily. For diabetes   Indication:  Type 2 Diabetes     metFORMIN 1000 MG tablet  Commonly known as:  GLUCOPHAGE  Take 1 tablet (1,000 mg total) by mouth 2 (two) times daily with a meal. For diabetes   Indication:  Type 2 Diabetes     multivitamin with minerals Tabs tablet  Take 1 tablet by mouth daily. For low vitamin   Indication:   Vitamin supplement     sertraline 100 MG tablet  Commonly known as:  ZOLOFT  Take 1 tablet (100 mg total) by mouth daily. For depression   Indication:  Major Depressive Disorder       Follow-up Information   Follow up with [facility]. (Walk in between 8am-9am Monday through Friday for hospital follow-up/medication management/assessment for therapy serrvices. )    Contact information:   [address] [place] [address] Phone: [phone] Fax: [phone]     Follow-up recommendations: Activity:  As tolerated Diet: As recommended by your primary care doctor. Keep all scheduled follow-up appointments as recommended.    Comments: Take all your medications as prescribed by your mental healthcare provider. Report any adverse effects and or reactions from your medicines to your outpatient provider promptly. Patient is instructed and cautioned to not engage in alcohol and or illegal drug use while on prescription medicines. In the event of worsening symptoms, patient is instructed to call the crisis hotline, 911 and or go to the nearest ED for appropriate evaluation and treatment of symptoms. Follow-up with your primary care provider for your other medical issues, concerns and or health care needs.   Total Discharge Time:  Greater than 30 minutes.  Signed: Encarnacion Slates, PMHNP-BC 09/28/2013, 1:18 PM  I personally assessed the patient and formulated the plan Geralyn Flash A. Sabra Heck, M.D.

## 2013-09-28 NOTE — Progress Notes (Signed)
[facility] Adult Case Management Discharge Plan :  Will you be returning to the same living situation after discharge: Yes,  home At discharge, do you have transportation home?:Yes,  pt's husband Do you have the ability to pay for your medications:Yes,  mental health  Release of information consent forms completed and submitted to Medical Records by CSW.  Patient to Follow up at: Follow-up Information   Follow up with [facility]. (Walk in between 8am-9am Monday through Friday for hospital follow-up/medication management/assessment for therapy serrvices. )    Contact information:   [address], [place] [address] Phone: [phone] Fax: [phone]      Patient denies SI/HI:   Yes, during group and self report.    Safety Planning and Suicide Prevention discussed:  Yes,  SPE completed with pt's husband. SPI pamphlet provided to pt and he was encouraged to share information with support network, ask questions, and talk about any concerns relating to SPE.  Nathan Stallworth Smart LCSWA  09/28/2013, 11:10 AM

## 2013-09-28 NOTE — Progress Notes (Signed)
Patient ID: [name], female   DOB: December 31, 1965, 48 y.o.   MRN: [id number] Discharge note 14:15  She has been discharged home and was picked up by her husband. She voiced understanding of discharge instructions and of follow up plan. She denies thoughts of SI and all her belongings were taken home with her.

## 2013-09-28 NOTE — [facility] Group Notes (Signed)
[facility] Group Notes:  (Nursing/MHT/Case Management/Adjunct)  Date:  09/28/2013  Time:  9:55 AM  Type of Therapy:  Nurse Education  Participation Level:  Did Not Attend  Participation Quality:  n/a  Affect:  n/a  Cognitive:  n/a  Insight:  None  Engagement in Group:  n/a  Modes of Intervention:  n/a  Summary of Progress/Problems:  [name] 09/28/2013, 9:55 AM

## 2013-09-28 NOTE — [facility] Suicide Risk Assessment (Signed)
Suicide Risk Assessment  Discharge Assessment     Demographic Factors:  Caucasian  Total Time spent with patient: 45 minutes  Psychiatric Specialty Exam:     Blood pressure 96/67, pulse 80, temperature 97.9 F (36.6 C), temperature source Oral, resp. rate 18, height 5\' 2"  (1.575 m), weight 59.875 kg (132 lb), last menstrual period 11/01/2011.Body mass index is 24.14 kg/(m^2).  General Appearance: Fairly Groomed  Engineer, water::  Fair  Speech:  Clear and Coherent  Volume:  Normal  Mood:  Euthymic  Affect:  Appropriate  Thought Process:  Coherent and Goal Directed  Orientation:  Full (Time, Place, and Person)  Thought Content:  events, symptoms, worries, concerns  Suicidal Thoughts:  No  Homicidal Thoughts:  No  Memory:  Immediate;   Fair Recent;   Fair Remote;   Fair  Judgement:  Fair  Insight:  Present  Psychomotor Activity:  Normal  Concentration:  Fair  Recall:  [employer] of Knowledge:NA  Language: Fair  Akathisia:  No  Handed:    AIMS (if indicated):     Assets:  Desire for Improvement Housing  Sleep:  Number of Hours: 6    Musculoskeletal: Strength & Muscle Tone: within normal limits Gait & Station: normal Patient leans: N/A   Mental Status Per Nursing Assessment::   On Admission:     Current Mental Status by Physician: In full contact with reality. There are no active S/S of withdrawal. No active SI, plans or intent. She states that if her husband does not change his ways she is ready to walk out.    Loss Factors: Decline in physical health  Historical Factors: NA  Risk Reduction Factors:   Sense of responsibility to family and Positive social support  Continued Clinical Symptoms:  Depression:   Comorbid alcohol abuse/dependence Alcohol/Substance Abuse/Dependencies  Cognitive Features That Contribute To Risk:  Closed-mindedness Polarized thinking Thought constriction (tunnel vision)    Suicide Risk:  Minimal: No identifiable suicidal  ideation.  Patients presenting with no risk factors but with morbid ruminations; may be classified as minimal risk based on the severity of the depressive symptoms  Discharge Diagnoses:   AXIS I:  Alcohol Dependence, Major Depression recurrent AXIS II:  Deferred AXIS III:   Past Medical History  Diagnosis Date  . Impaired glucose tolerance 11/07/2010  . Hepatitis C approx dx 2005    no tx to date  . Pancreatitis     HX of  . Alcohol dependency   . GERD (gastroesophageal reflux disease)   . Chronic constipation   . Gallstones   . Drug dependency     Hx of cocaine use  . Depression   . Rheumatoid factor positive   . Hemochromatosis     last phlebotomy tx 2-3 yrs  . Pancytopenia   . Abnormal vaginal bleeding     uterine fibroid  . Gallstone 11/09/2010  . Chronic abdominal pain 11/09/2010  . Cirrhosis 11/09/2010  . Type II or unspecified type diabetes mellitus without mention of complication, uncontrolled 09/17/2011  . Anxiety 09/17/2011   AXIS IV:  other psychosocial or environmental problems AXIS V:  61-70 mild symptoms  Plan Of Care/Follow-up recommendations:  Activity:  as tolerated Diet:  regular Follow up outpatient basis/[facility] Is patient on multiple antipsychotic therapies at discharge:  No   Has Patient had three or more failed trials of antipsychotic monotherapy by history:  No  Recommended Plan for Multiple Antipsychotic Therapies: NA    Nicholaus Bloom 09/28/2013, 12:30  PM

## 2013-09-28 NOTE — Progress Notes (Signed)
Pt observed lying in bed with her eyes closed, but she promptly responds when her name is called.  Pt reports she is feeling tired, but is not having any significant withdrawal symptoms at this time.  Pt denies SI/HI/AV at this time.  Pt was encouraged to make her needs known to staff.  Pt voiced understanding.  Pt did not discuss her discharge plans at this time.  Support and encouragement offered.  Safety maintained with q15 minute checks.

## 2013-09-28 NOTE — Consult Note (Signed)
Face to face evaluation and I agree with this note 

## 2013-10-01 NOTE — Progress Notes (Signed)
Patient Discharge Instructions:  After Visit Summary (AVS):   Faxed to:  10/01/13 Discharge Summary Note:   Faxed to:  10/01/13 Psychiatric Admission Assessment Note:   Faxed to:  10/01/13 Suicide Risk Assessment - Discharge Assessment:   Faxed to:  10/01/13 Faxed/Sent to the Next Level Care provider:  10/01/13 Faxed to [facility] [place], 10/01/2013, 3:48 PM

## 2013-10-12 ENCOUNTER — Ambulatory Visit: Payer: BC Managed Care – PPO | Admitting: Internal Medicine

## 2013-11-13 ENCOUNTER — Other Ambulatory Visit: Payer: Self-pay | Admitting: Internal Medicine

## 2013-11-13 ENCOUNTER — Other Ambulatory Visit (HOSPITAL_COMMUNITY): Payer: Self-pay | Admitting: Physician Assistant

## 2013-11-15 NOTE — Telephone Encounter (Signed)
Dr. Linna Darner only ok # 30 faxed script back to [facility].../lmb

## 2013-11-15 NOTE — Telephone Encounter (Signed)
MD out of office. Pls advise.../lmb 

## 2013-11-27 ENCOUNTER — Other Ambulatory Visit: Payer: Self-pay | Admitting: Internal Medicine

## 2013-12-08 ENCOUNTER — Emergency Department (HOSPITAL_COMMUNITY): Payer: Self-pay

## 2013-12-08 ENCOUNTER — Encounter (HOSPITAL_COMMUNITY): Payer: Self-pay | Admitting: Emergency Medicine

## 2013-12-08 ENCOUNTER — Inpatient Hospital Stay (HOSPITAL_COMMUNITY)
Admission: EM | Admit: 2013-12-08 | Discharge: 2013-12-13 | DRG: 388 | Disposition: A | Discharge status: Home / Self Care | Payer: BC Managed Care – PPO | Attending: Internal Medicine | Admitting: Internal Medicine

## 2013-12-08 DIAGNOSIS — F10929 Alcohol use, unspecified with intoxication, unspecified: Secondary | ICD-10-CM | POA: Diagnosis present

## 2013-12-08 DIAGNOSIS — K319 Disease of stomach and duodenum, unspecified: Secondary | ICD-10-CM | POA: Diagnosis present

## 2013-12-08 DIAGNOSIS — Z87891 Personal history of nicotine dependence: Secondary | ICD-10-CM

## 2013-12-08 DIAGNOSIS — K819 Cholecystitis, unspecified: Secondary | ICD-10-CM

## 2013-12-08 DIAGNOSIS — E876 Hypokalemia: Secondary | ICD-10-CM | POA: Diagnosis present

## 2013-12-08 DIAGNOSIS — Z91041 Radiographic dye allergy status: Secondary | ICD-10-CM

## 2013-12-08 DIAGNOSIS — K2990 Gastroduodenitis, unspecified, without bleeding: Secondary | ICD-10-CM

## 2013-12-08 DIAGNOSIS — E440 Moderate protein-calorie malnutrition: Secondary | ICD-10-CM | POA: Diagnosis present

## 2013-12-08 DIAGNOSIS — K226 Gastro-esophageal laceration-hemorrhage syndrome: Secondary | ICD-10-CM | POA: Diagnosis present

## 2013-12-08 DIAGNOSIS — K8020 Calculus of gallbladder without cholecystitis without obstruction: Secondary | ICD-10-CM | POA: Diagnosis present

## 2013-12-08 DIAGNOSIS — F10920 Alcohol use, unspecified with intoxication, uncomplicated: Secondary | ICD-10-CM

## 2013-12-08 DIAGNOSIS — D759 Disease of blood and blood-forming organs, unspecified: Secondary | ICD-10-CM | POA: Diagnosis present

## 2013-12-08 DIAGNOSIS — Z794 Long term (current) use of insulin: Secondary | ICD-10-CM

## 2013-12-08 DIAGNOSIS — K7010 Alcoholic hepatitis without ascites: Secondary | ICD-10-CM | POA: Diagnosis present

## 2013-12-08 DIAGNOSIS — M549 Dorsalgia, unspecified: Secondary | ICD-10-CM | POA: Diagnosis not present

## 2013-12-08 DIAGNOSIS — F1410 Cocaine abuse, uncomplicated: Secondary | ICD-10-CM

## 2013-12-08 DIAGNOSIS — Z833 Family history of diabetes mellitus: Secondary | ICD-10-CM

## 2013-12-08 DIAGNOSIS — B1920 Unspecified viral hepatitis C without hepatic coma: Secondary | ICD-10-CM | POA: Diagnosis present

## 2013-12-08 DIAGNOSIS — R1084 Generalized abdominal pain: Secondary | ICD-10-CM

## 2013-12-08 DIAGNOSIS — K259 Gastric ulcer, unspecified as acute or chronic, without hemorrhage or perforation: Secondary | ICD-10-CM | POA: Diagnosis present

## 2013-12-08 DIAGNOSIS — IMO0001 Reserved for inherently not codable concepts without codable children: Secondary | ICD-10-CM | POA: Diagnosis present

## 2013-12-08 DIAGNOSIS — E119 Type 2 diabetes mellitus without complications: Secondary | ICD-10-CM | POA: Diagnosis present

## 2013-12-08 DIAGNOSIS — K7030 Alcoholic cirrhosis of liver without ascites: Secondary | ICD-10-CM | POA: Diagnosis present

## 2013-12-08 DIAGNOSIS — Z8041 Family history of malignant neoplasm of ovary: Secondary | ICD-10-CM

## 2013-12-08 DIAGNOSIS — K219 Gastro-esophageal reflux disease without esophagitis: Secondary | ICD-10-CM | POA: Diagnosis present

## 2013-12-08 DIAGNOSIS — IMO0002 Reserved for concepts with insufficient information to code with codable children: Secondary | ICD-10-CM

## 2013-12-08 DIAGNOSIS — E1165 Type 2 diabetes mellitus with hyperglycemia: Secondary | ICD-10-CM

## 2013-12-08 DIAGNOSIS — F1010 Alcohol abuse, uncomplicated: Secondary | ICD-10-CM

## 2013-12-08 DIAGNOSIS — K56609 Unspecified intestinal obstruction, unspecified as to partial versus complete obstruction: Secondary | ICD-10-CM

## 2013-12-08 DIAGNOSIS — R1011 Right upper quadrant pain: Secondary | ICD-10-CM

## 2013-12-08 DIAGNOSIS — K8000 Calculus of gallbladder with acute cholecystitis without obstruction: Secondary | ICD-10-CM | POA: Diagnosis present

## 2013-12-08 DIAGNOSIS — F411 Generalized anxiety disorder: Secondary | ICD-10-CM | POA: Diagnosis present

## 2013-12-08 DIAGNOSIS — K56600 Partial intestinal obstruction, unspecified as to cause: Secondary | ICD-10-CM | POA: Diagnosis present

## 2013-12-08 DIAGNOSIS — Z79899 Other long term (current) drug therapy: Secondary | ICD-10-CM

## 2013-12-08 DIAGNOSIS — G8929 Other chronic pain: Secondary | ICD-10-CM | POA: Diagnosis present

## 2013-12-08 DIAGNOSIS — F321 Major depressive disorder, single episode, moderate: Secondary | ICD-10-CM

## 2013-12-08 DIAGNOSIS — F329 Major depressive disorder, single episode, unspecified: Secondary | ICD-10-CM | POA: Diagnosis present

## 2013-12-08 DIAGNOSIS — K2970 Gastritis, unspecified, without bleeding: Secondary | ICD-10-CM | POA: Diagnosis present

## 2013-12-08 DIAGNOSIS — D6959 Other secondary thrombocytopenia: Secondary | ICD-10-CM | POA: Diagnosis present

## 2013-12-08 DIAGNOSIS — B1710 Acute hepatitis C without hepatic coma: Secondary | ICD-10-CM

## 2013-12-08 DIAGNOSIS — K920 Hematemesis: Secondary | ICD-10-CM

## 2013-12-08 DIAGNOSIS — D696 Thrombocytopenia, unspecified: Secondary | ICD-10-CM

## 2013-12-08 DIAGNOSIS — F1020 Alcohol dependence, uncomplicated: Secondary | ICD-10-CM | POA: Diagnosis present

## 2013-12-08 DIAGNOSIS — R109 Unspecified abdominal pain: Secondary | ICD-10-CM

## 2013-12-08 LAB — URINALYSIS, ROUTINE W REFLEX MICROSCOPIC
Bilirubin Urine: NEGATIVE
Glucose, UA: 500 mg/dL — AB
Hgb urine dipstick: NEGATIVE
Ketones, ur: NEGATIVE mg/dL
Leukocytes, UA: NEGATIVE
Nitrite: NEGATIVE
Protein, ur: NEGATIVE mg/dL
Specific Gravity, Urine: 1.009 (ref 1.005–1.030)
Urobilinogen, UA: 0.2 mg/dL (ref 0.0–1.0)
pH: 6.5 (ref 5.0–8.0)

## 2013-12-08 LAB — COMPREHENSIVE METABOLIC PANEL
ALT: 149 U/L — ABNORMAL HIGH (ref 0–35)
ALT: 136 U/L — ABNORMAL HIGH (ref 0–35)
AST: 149 U/L — ABNORMAL HIGH (ref 0–37)
AST: 127 U/L — ABNORMAL HIGH (ref 0–37)
Albumin: 3.7 g/dL (ref 3.5–5.2)
Albumin: 3.3 g/dL — ABNORMAL LOW (ref 3.5–5.2)
Alkaline Phosphatase: 127 U/L — ABNORMAL HIGH (ref 39–117)
Alkaline Phosphatase: 117 U/L (ref 39–117)
Anion gap: 18 — ABNORMAL HIGH (ref 5–15)
Anion gap: 14 (ref 5–15)
BUN: 4 mg/dL — ABNORMAL LOW (ref 6–23)
BUN: 4 mg/dL — ABNORMAL LOW (ref 6–23)
CALCIUM: 9.2 mg/dL (ref 8.4–10.5)
CO2: 25 mEq/L (ref 19–32)
CO2: 27 mEq/L (ref 19–32)
Calcium: 9.6 mg/dL (ref 8.4–10.5)
Chloride: 96 mEq/L (ref 96–112)
Chloride: 100 mEq/L (ref 96–112)
Creatinine, Ser: 0.45 mg/dL — ABNORMAL LOW (ref 0.50–1.10)
Creatinine, Ser: 0.41 mg/dL — ABNORMAL LOW (ref 0.50–1.10)
GFR calc Af Amer: >90 mL/min (ref >90)
GFR calc non Af Amer: >90 mL/min (ref >90)
GFR calc non Af Amer: >90 mL/min (ref >90)
GLUCOSE: 248 mg/dL — AB (ref 70–99)
Glucose, Bld: 386 mg/dL — ABNORMAL HIGH (ref 70–99)
Potassium: 3.8 mEq/L (ref 3.7–5.3)
Potassium: 3.5 mEq/L — ABNORMAL LOW (ref 3.7–5.3)
Sodium: 139 mEq/L (ref 137–147)
Sodium: 141 mEq/L (ref 137–147)
TOTAL PROTEIN: 7.7 g/dL (ref 6.0–8.3)
Total Bilirubin: 1.1 mg/dL (ref 0.3–1.2)
Total Bilirubin: 1 mg/dL (ref 0.3–1.2)
Total Protein: 8.5 g/dL — ABNORMAL HIGH (ref 6.0–8.3)

## 2013-12-08 LAB — CBC WITH DIFFERENTIAL/PLATELET
Basophils Absolute: 0 10*3/uL (ref 0.0–0.1)
Basophils Relative: 1 % (ref 0–1)
Eosinophils Absolute: 0 10*3/uL (ref 0.0–0.7)
Eosinophils Relative: 0 % (ref 0–5)
HCT: 42.8 % (ref 36.0–46.0)
Hemoglobin: 14.7 g/dL (ref 12.0–15.0)
Lymphocytes Relative: 36 % (ref 12–46)
Lymphs Abs: 1.2 10*3/uL (ref 0.7–4.0)
MCH: 35.1 pg — ABNORMAL HIGH (ref 26.0–34.0)
MCHC: 34.3 g/dL (ref 30.0–36.0)
MCV: 102.1 fL — ABNORMAL HIGH (ref 78.0–100.0)
Monocytes Absolute: 0.3 10*3/uL (ref 0.1–1.0)
Monocytes Relative: 10 % (ref 3–12)
Neutro Abs: 1.8 10*3/uL (ref 1.7–7.7)
Neutrophils Relative %: 54 % (ref 43–77)
Platelets: 63 10*3/uL — ABNORMAL LOW (ref 150–400)
RBC: 4.19 MIL/uL (ref 3.87–5.11)
RDW: 13.6 % (ref 11.5–15.5)
WBC: 3.3 10*3/uL — ABNORMAL LOW (ref 4.0–10.5)

## 2013-12-08 LAB — CBC
HCT: 39.9 % (ref 36.0–46.0)
HEMOGLOBIN: 13.8 g/dL (ref 12.0–15.0)
MCH: 34.8 pg — AB (ref 26.0–34.0)
MCHC: 34.6 g/dL (ref 30.0–36.0)
MCV: 100.8 fL — ABNORMAL HIGH (ref 78.0–100.0)
PLATELETS: 55 10*3/uL — AB (ref 150–400)
RBC: 3.96 MIL/uL (ref 3.87–5.11)
RDW: 13.6 % (ref 11.5–15.5)
WBC: 3.5 10*3/uL — ABNORMAL LOW (ref 4.0–10.5)

## 2013-12-08 LAB — PROTIME-INR
INR: 1.14 (ref 0.00–1.49)
Prothrombin Time: 14.6 seconds (ref 11.6–15.2)

## 2013-12-08 LAB — LIPASE, BLOOD: Lipase: 55 U/L (ref 11–59)

## 2013-12-08 LAB — GLUCOSE, CAPILLARY
GLUCOSE-CAPILLARY: 243 mg/dL — AB (ref 70–99)
GLUCOSE-CAPILLARY: 246 mg/dL — AB (ref 70–99)

## 2013-12-08 LAB — ETHANOL: Alcohol, Ethyl (B): 172 mg/dL — ABNORMAL HIGH (ref 0–11)

## 2013-12-08 MED ORDER — SERTRALINE HCL 50 MG PO TABS
150.0000 mg | ORAL_TABLET | Freq: Every day | ORAL | Status: DC
  Administered 2013-12-08 – 2013-12-13 (×4): 150 mg via ORAL
  Filled 2013-12-08 (×7): qty 1

## 2013-12-08 MED ORDER — INSULIN ASPART 100 UNIT/ML ~~LOC~~ SOLN
0.0000 [IU] | Freq: Three times a day (TID) | SUBCUTANEOUS | Status: DC
  Administered 2013-12-09: 5 [IU] via SUBCUTANEOUS

## 2013-12-08 MED ORDER — LIDOCAINE HCL 2 % EX GEL
CUTANEOUS | Status: AC
  Filled 2013-12-08: qty 10

## 2013-12-08 MED ORDER — ONDANSETRON HCL 4 MG/2ML IJ SOLN
4.0000 mg | Freq: Once | INTRAMUSCULAR | Status: AC
  Administered 2013-12-08: 4 mg via INTRAVENOUS
  Filled 2013-12-08: qty 2

## 2013-12-08 MED ORDER — HYDROMORPHONE HCL PF 1 MG/ML IJ SOLN
1.0000 mg | Freq: Once | INTRAMUSCULAR | Status: AC
  Administered 2013-12-08: 1 mg via INTRAVENOUS
  Filled 2013-12-08: qty 1

## 2013-12-08 MED ORDER — KCL IN DEXTROSE-NACL 20-5-0.45 MEQ/L-%-% IV SOLN
INTRAVENOUS | Status: DC
  Administered 2013-12-08: 75 mL/h via INTRAVENOUS
  Administered 2013-12-09: 08:00:00 via INTRAVENOUS
  Administered 2013-12-10: 75 mL/h via INTRAVENOUS
  Administered 2013-12-11 – 2013-12-13 (×4): via INTRAVENOUS
  Filled 2013-12-08 (×14): qty 1000

## 2013-12-08 MED ORDER — SODIUM CHLORIDE 0.9 % IV BOLUS (SEPSIS)
1000.0000 mL | Freq: Once | INTRAVENOUS | Status: AC
  Administered 2013-12-08: 1000 mL via INTRAVENOUS

## 2013-12-08 MED ORDER — PIPERACILLIN-TAZOBACTAM 3.375 G IVPB 30 MIN
3.3750 g | Freq: Once | INTRAVENOUS | Status: DC
  Administered 2013-12-08: 3.375 g via INTRAVENOUS
  Filled 2013-12-08: qty 50

## 2013-12-08 MED ORDER — MORPHINE SULFATE 2 MG/ML IJ SOLN
2.0000 mg | INTRAMUSCULAR | Status: DC | PRN
  Administered 2013-12-08 – 2013-12-09 (×4): 2 mg via INTRAVENOUS
  Filled 2013-12-08 (×4): qty 1

## 2013-12-08 MED ORDER — ONDANSETRON HCL 4 MG/2ML IJ SOLN
4.0000 mg | Freq: Four times a day (QID) | INTRAMUSCULAR | Status: DC | PRN
  Administered 2013-12-13: 4 mg via INTRAVENOUS
  Filled 2013-12-08: qty 2

## 2013-12-08 MED ORDER — ONDANSETRON HCL 4 MG PO TABS: 4.0000 mg | ORAL_TABLET | Freq: Four times a day (QID) | ORAL | Status: DC | PRN

## 2013-12-08 NOTE — ED Provider Notes (Signed)
CSN: [id number]     Arrival date & time 12/08/13  24 History   First MD Initiated Contact with Patient 12/08/13 1529     Chief Complaint  Patient presents with  . Abdominal Pain     (Consider location/radiation/quality/duration/timing/severity/associated sxs/prior Treatment) HPI  48 year old female with right-sided abdominal pain. Patient reports that this has been chronic for the past several months. She points to her right flank and right lower back. She states that this radiates down into her right lower quadrant and suprapubically. Worse with the past day. Also nausea and vomiting today. Patient has been drinking alcohol. She has a past history of alcohol abuse. She does have history of pancreatitis. She is unsure this feels similar to previous bouts of that. No blood in her emesis. No diarrhea. No urinary complaints. No fevers or chills. No sick contacts. Surgical history significant for appendectomy, cesarean section and hysterectomy with bilateral salpingo-oophorectomy. She reports hx of gallstones.   Past Medical History  Diagnosis Date  . Impaired glucose tolerance 11/07/2010  . Hepatitis C approx dx 2005    no tx to date  . Pancreatitis     HX of  . Alcohol dependency   . GERD (gastroesophageal reflux disease)   . Chronic constipation   . Gallstones   . Drug dependency     Hx of cocaine use  . Depression   . Rheumatoid factor positive   . Hemochromatosis     last phlebotomy tx 2-3 yrs  . Pancytopenia   . Abnormal vaginal bleeding     uterine fibroid  . Gallstone 11/09/2010  . Chronic abdominal pain 11/09/2010  . Cirrhosis 11/09/2010  . Type II or unspecified type diabetes mellitus without mention of complication, uncontrolled 09/17/2011  . Anxiety 09/17/2011   Past Surgical History  Procedure Laterality Date  . Appendectomy    . Cesarean section  x 2  . Foot surgery  2013  . Abdominal hysterectomy  12/17/2011    Procedure: HYSTERECTOMY ABDOMINAL;  Surgeon: Gus Height, MD;  Location: [facility];  Service: Gynecology;  Laterality: N/A;  . Salpingoophorectomy  12/17/2011    Procedure: SALPINGO OOPHERECTOMY;  Surgeon: Gus Height, MD;  Location: [facility];  Service: Gynecology;  Laterality: Bilateral;   Family History  Problem Relation Age of Onset  . Cancer Mother     ovarian  . Diabetes Father    History  Substance Use Topics  . Smoking status: Former Research scientist (life sciences)  . Smokeless tobacco: Not on file  . Alcohol Use: Yes   OB History   Grav Para Term Preterm Abortions TAB SAB Ect Mult Living   2 2 2       3      Review of Systems  All systems reviewed and negative, other than as noted in HPI.   Allergies  Iodinated diagnostic agents  Home Medications   Prior to Admission medications   Medication Sig Start Date End Date Taking? Authorizing Provider  chlorpheniramine (CHLOR-TRIMETON) 4 MG tablet Take 4 mg by mouth once as needed for allergies.   Yes Historical Provider, MD  esomeprazole (NEXIUM) 40 MG capsule Take 1 capsule (40 mg total) by mouth daily as needed (heart burn). 09/28/13  Yes Encarnacion Slates, NP  Ferrous Sulfate (IRON) 28 MG TABS Take 1 tablet by mouth daily.   Yes Historical Provider, MD  glucose blood test strip 1 each by Other route 3 (three) times daily. Use as instructed   Yes Historical Provider, MD  insulin aspart (  NOVOLOG) 100 UNIT/ML injection Inject 5 Units into the skin 3 (three) times daily before meals.   Yes Historical Provider, MD  insulin glargine (LANTUS) 100 UNIT/ML injection Inject 15 Units into the skin at bedtime. @@ 10 pm   Yes Historical Provider, MD  Lancets MISC 1 each by Does not apply route 3 (three) times daily.   Yes Historical Provider, MD  linagliptin (TRADJENTA) 5 MG TABS tablet Take 1 tablet (5 mg total) by mouth daily. For diabetes 09/28/13  Yes Encarnacion Slates, NP  metFORMIN (GLUCOPHAGE) 500 MG tablet Take 500 mg by mouth 2 (two) times daily with a meal.   Yes Historical Provider, MD  Pumpkin Seed-Soy Germ (AZO  BLADDER CONTROL/GO-LESS PO) Take 1 tablet by mouth once as needed (urinary tract symptoms).   Yes Historical Provider, MD  sertraline (ZOLOFT) 100 MG tablet Take 150 mg by mouth daily.   Yes Historical Provider, MD  tetrahydrozoline (VISINE) 0.05 % ophthalmic solution Place 1 drop into both eyes every morning.   Yes Historical Provider, MD  zolpidem (AMBIEN) 10 MG tablet Take 10 mg by mouth at bedtime as needed for sleep.   Yes Historical Provider, MD   BP 124/90  Pulse 118  Temp(Src) 98.7 F (37.1 C) (Oral)  Resp 18  Ht 5\' 2"  (1.575 m)  Wt 122 lb (55.339 kg)  BMI 22.31 kg/m2  SpO2 100%  LMP 11/01/2011 Physical Exam  Nursing note and vitals reviewed. Constitutional: She appears well-developed and well-nourished. No distress.  HENT:  Head: Normocephalic and atraumatic.  Eyes: Conjunctivae are normal. Right eye exhibits no discharge. Left eye exhibits no discharge.  Neck: Neck supple.  Cardiovascular: Regular rhythm and normal heart sounds.  Exam reveals no gallop and no friction rub.   No murmur heard. Tachycardic  Pulmonary/Chest: Effort normal and breath sounds normal. No respiratory distress.  Abdominal: Soft. She exhibits no distension. There is tenderness. There is guarding. There is no rebound.  Severe, diffuse abdominal tenderness with voluntary guarding. No rebound tenderness. No distention.  Musculoskeletal: She exhibits no edema and no tenderness.  Neurological: She is alert.  Skin: Skin is warm and dry.  Psychiatric: She has a normal mood and affect. Her behavior is normal. Thought content normal.    ED Course  Procedures (including critical care time) Labs Review Labs Reviewed  CBC WITH DIFFERENTIAL - Abnormal; Notable for the following:    WBC 3.3 (*)    MCV 102.1 (*)    MCH 35.1 (*)    Platelets 63 (*)    All other components within normal limits  URINALYSIS, ROUTINE W REFLEX MICROSCOPIC - Abnormal; Notable for the following:    Glucose, UA 500 (*)    All  other components within normal limits  COMPREHENSIVE METABOLIC PANEL  LIPASE, BLOOD    Imaging Review Ct Abdomen Pelvis Wo Contrast  12/08/2013   CLINICAL DATA:  Abdominal pain  EXAM: CT ABDOMEN AND PELVIS WITHOUT CONTRAST  TECHNIQUE: Multidetector CT imaging of the abdomen and pelvis was performed following the standard protocol without IV contrast.  COMPARISON:  CT abdomen pelvis 01/20/2011  FINDINGS: Progression of cirrhotic changes of the liver. The liver is enlarged with nodular contour diffusely. Gallbladder is distended and contains a small gallstone. Gallbladder wall may be mildly thickened. Spleen is mildly enlarged.  Pancreas normal. 1 mm nonobstructing lower pole renal calculi. No renal mass or obstruction.  Proximal small bowel is mildly dilated with air-fluid levels suggesting partial proximal small bowel obstruction. Distal small  bowel and colon are decompressed. Surgical clip at the base of the appendix. Negative for ascites.  IMPRESSION: Advanced cirrhosis. Spleen is mildly enlarged. Negative for ascites  Distended gallbladder with gallstone and mild pericholecystic fluid. Correlate with pain in this area  Mild dilatation of the jejunum with air-fluid levels suggesting partial obstruction of the proximal small bowel.   Electronically Signed   By: Franchot Gallo M.D.   On: 12/08/2013 16:31     EKG Interpretation None      MDM   Final diagnoses:  Cholecystitis  Partial small bowel obstruction  ETOH abuse  Thrombocytopenia    48 year old female with abdominal pain and nausea and vomiting. She is significantly tender on exam.  CT abdomen and pelvis. She has a history of allergy to iodinated contrast agents. CT without contrast. Consider pancreatitis, particularly history of alcohol abuse as well as gallstones. Consider cholecystitis. Consider PUD/gastritis. Less like AAA, ureteral colic, UTI, diverticulitis, other. She is status post appendectomy and hysterectomy.  CT with  distended GB with stones. GB wall possibly thickened. Pericholecystic fluid w/o ascites. Suspect she does have cholecystitis. Lipase normal. She is diffusely tender on exam, but reports pt worse on R side. Deferred [place] until talk with surgery.    1658: Discussed with general surgery, Dr Zella Richer. Will see pt in consultation. With hyperglycemia, thrombocytopenia, comorbidities, etc feels that she needs medical admission. Hospitalist paged to discuss.   Virgel Manifold, MD 12/08/13 [phone]

## 2013-12-08 NOTE — Consult Note (Signed)
Reason for Consult:  Right upper quadrant pain and gallstones Referring Physician: Dr. Virgel Manifold  [name] is an 48 y.o. female.  HPI: this is a 48 year old female who is said chronic right-sided abdominal pain for many months. Over the past week she states become worse. She hurts in the right upper quadrant, right flank, left upper quadrant, left flank, and epigastric region. She's had nausea and vomiting. Some of the vomiting has been bloody. She has history of alcohol abuse and has been drinking. She also has a history of cirrhosis, hepatitis C, pancreatitis, polysubstance abuse, hemachromatosis,10 chronic abdominal pain. She is chronically elevated liver function tests as well. No fever but some chills. She is requesting more pain medication.  CT scan demonstrates known gallstone, there is possibly some mild gallbladder wall thickening. There's nodularity in the liver. There is splenomegaly.  Past Medical History  Diagnosis Date  . Impaired glucose tolerance 11/07/2010  . Hepatitis C approx dx 2005    no tx to date  . Pancreatitis     HX of  . Alcohol dependency   . GERD (gastroesophageal reflux disease)   . Chronic constipation   . Gallstones   . Drug dependency     Hx of cocaine use  . Depression   . Rheumatoid factor positive   . Hemochromatosis     last phlebotomy tx 2-3 yrs  . Pancytopenia   . Abnormal vaginal bleeding     uterine fibroid  . Gallstone 11/09/2010  . Chronic abdominal pain 11/09/2010  . Cirrhosis 11/09/2010  . Type II or unspecified type diabetes mellitus without mention of complication, uncontrolled 09/17/2011  . Anxiety 09/17/2011    Past Surgical History  Procedure Laterality Date  . Appendectomy    . Cesarean section  x 2  . Foot surgery  2013  . Abdominal hysterectomy  12/17/2011    Procedure: HYSTERECTOMY ABDOMINAL;  Surgeon: Gus Height, MD;  Location: [facility];  Service: Gynecology;  Laterality: N/A;  . Salpingoophorectomy  12/17/2011   Procedure: SALPINGO OOPHERECTOMY;  Surgeon: Gus Height, MD;  Location: [facility];  Service: Gynecology;  Laterality: Bilateral;    Family History  Problem Relation Age of Onset  . Cancer Mother     ovarian  . Diabetes Father     Social History:  reports that she has quit smoking. She does not have any smokeless tobacco history on file. She reports that she drinks alcohol. She reports that she does not use illicit drugs.  Allergies:  Allergies  Allergen Reactions  . Iodinated Diagnostic Agents Hives    Pt states allergic to Betadine.    Prior to Admission medications   Medication Sig Start Date End Date Taking? Authorizing Provider  chlorpheniramine (CHLOR-TRIMETON) 4 MG tablet Take 4 mg by mouth once as needed for allergies.   Yes Historical Provider, MD  esomeprazole (NEXIUM) 40 MG capsule Take 1 capsule (40 mg total) by mouth daily as needed (heart burn). 09/28/13  Yes Encarnacion Slates, NP  Ferrous Sulfate (IRON) 28 MG TABS Take 1 tablet by mouth daily.   Yes Historical Provider, MD  glucose blood test strip 1 each by Other route 3 (three) times daily. Use as instructed   Yes Historical Provider, MD  insulin aspart (NOVOLOG) 100 UNIT/ML injection Inject 5 Units into the skin 3 (three) times daily before meals.   Yes Historical Provider, MD  insulin glargine (LANTUS) 100 UNIT/ML injection Inject 15 Units into the skin at bedtime. @@ 10 pm  Yes Historical Provider, MD  Lancets MISC 1 each by Does not apply route 3 (three) times daily.   Yes Historical Provider, MD  linagliptin (TRADJENTA) 5 MG TABS tablet Take 1 tablet (5 mg total) by mouth daily. For diabetes 09/28/13  Yes Agnes I Nwoko, NP  metFORMIN (GLUCOPHAGE) 500 MG tablet Take 500 mg by mouth 2 (two) times daily with a meal.   Yes Historical Provider, MD  Pumpkin Seed-Soy Germ (AZO BLADDER CONTROL/GO-LESS PO) Take 1 tablet by mouth once as needed (urinary tract symptoms).   Yes Historical Provider, MD  sertraline (ZOLOFT) 100 MG tablet  Take 150 mg by mouth daily.   Yes Historical Provider, MD  tetrahydrozoline (VISINE) 0.05 % ophthalmic solution Place 1 drop into both eyes every morning.   Yes Historical Provider, MD  zolpidem (AMBIEN) 10 MG tablet Take 10 mg by mouth at bedtime as needed for sleep.   Yes Historical Provider, MD     Results for orders placed during the hospital encounter of 12/08/13 (from the past 48 hour(s))  URINALYSIS, ROUTINE W REFLEX MICROSCOPIC     Status: Abnormal   Collection Time    12/08/13  3:02 PM      Result Value Ref Range   Color, Urine YELLOW  YELLOW   APPearance CLEAR  CLEAR   Specific Gravity, Urine 1.009  1.005 - 1.030   pH 6.5  5.0 - 8.0   Glucose, UA 500 (*) NEGATIVE mg/dL   Hgb urine dipstick NEGATIVE  NEGATIVE   Bilirubin Urine NEGATIVE  NEGATIVE   Ketones, ur NEGATIVE  NEGATIVE mg/dL   Protein, ur NEGATIVE  NEGATIVE mg/dL   Urobilinogen, UA 0.2  0.0 - 1.0 mg/dL   Nitrite NEGATIVE  NEGATIVE   Leukocytes, UA NEGATIVE  NEGATIVE   Comment: MICROSCOPIC NOT DONE ON URINES WITH NEGATIVE PROTEIN, BLOOD, LEUKOCYTES, NITRITE, OR GLUCOSE <1000 mg/dL.  CBC WITH DIFFERENTIAL     Status: Abnormal   Collection Time    12/08/13  3:35 PM      Result Value Ref Range   WBC 3.3 (*) 4.0 - 10.5 K/uL   RBC 4.19  3.87 - 5.11 MIL/uL   Hemoglobin 14.7  12.0 - 15.0 g/dL   HCT 42.8  36.0 - 46.0 %   MCV 102.1 (*) 78.0 - 100.0 fL   MCH 35.1 (*) 26.0 - 34.0 pg   MCHC 34.3  30.0 - 36.0 g/dL   RDW 13.6  11.5 - 15.5 %   Platelets 63 (*) 150 - 400 K/uL   Comment: REPEATED TO VERIFY     SPECIMEN CHECKED FOR CLOTS     PLATELET COUNT CONFIRMED BY SMEAR   Neutrophils Relative % 54  43 - 77 %   Neutro Abs 1.8  1.7 - 7.7 K/uL   Lymphocytes Relative 36  12 - 46 %   Lymphs Abs 1.2  0.7 - 4.0 K/uL   Monocytes Relative 10  3 - 12 %   Monocytes Absolute 0.3  0.1 - 1.0 K/uL   Eosinophils Relative 0  0 - 5 %   Eosinophils Absolute 0.0  0.0 - 0.7 K/uL   Basophils Relative 1  0 - 1 %   Basophils Absolute 0.0   0.0 - 0.1 K/uL  COMPREHENSIVE METABOLIC PANEL     Status: Abnormal   Collection Time    12/08/13  3:35 PM      Result Value Ref Range   Sodium 139  137 - 147 mEq/L     Potassium 3.8  3.7 - 5.3 mEq/L   Chloride 96  96 - 112 mEq/L   CO2 25  19 - 32 mEq/L   Glucose, Bld 386 (*) 70 - 99 mg/dL   BUN 4 (*) 6 - 23 mg/dL   Creatinine, Ser 0.45 (*) 0.50 - 1.10 mg/dL   Calcium 9.6  8.4 - 10.5 mg/dL   Total Protein 8.5 (*) 6.0 - 8.3 g/dL   Albumin 3.7  3.5 - 5.2 g/dL   AST 149 (*) 0 - 37 U/L   ALT 149 (*) 0 - 35 U/L   Alkaline Phosphatase 127 (*) 39 - 117 U/L   Total Bilirubin 1.1  0.3 - 1.2 mg/dL   GFR calc non Af Amer >90  >90 mL/min   GFR calc Af Amer >90  >90 mL/min   Comment: (NOTE)     The eGFR has been calculated using the CKD EPI equation.     This calculation has not been validated in all clinical situations.     eGFR's persistently <90 mL/min signify possible Chronic Kidney     Disease.   Anion gap 18 (*) 5 - 15  LIPASE, BLOOD     Status: None   Collection Time    12/08/13  3:35 PM      Result Value Ref Range   Lipase 55  11 - 59 U/L    Ct Abdomen Pelvis Wo Contrast  12/08/2013   CLINICAL DATA:  Abdominal pain  EXAM: CT ABDOMEN AND PELVIS WITHOUT CONTRAST  TECHNIQUE: Multidetector CT imaging of the abdomen and pelvis was performed following the standard protocol without IV contrast.  COMPARISON:  CT abdomen pelvis 01/20/2011  FINDINGS: Progression of cirrhotic changes of the liver. The liver is enlarged with nodular contour diffusely. Gallbladder is distended and contains a small gallstone. Gallbladder wall may be mildly thickened. Spleen is mildly enlarged.  Pancreas normal. 1 mm nonobstructing lower pole renal calculi. No renal mass or obstruction.  Proximal small bowel is mildly dilated with air-fluid levels suggesting partial proximal small bowel obstruction. Distal small bowel and colon are decompressed. Surgical clip at the base of the appendix. Negative for ascites.   IMPRESSION: Advanced cirrhosis. Spleen is mildly enlarged. Negative for ascites  Distended gallbladder with gallstone and mild pericholecystic fluid. Correlate with pain in this area  Mild dilatation of the jejunum with air-fluid levels suggesting partial obstruction of the proximal small bowel.   Electronically Signed   By: Charles  Clark M.D.   On: 12/08/2013 16:31    Review of Systems  Constitutional: Positive for chills. Negative for fever.  Cardiovascular: Positive for chest pain.  Gastrointestinal: Positive for nausea, vomiting, abdominal pain and blood in stool.  Genitourinary: Negative for hematuria.  Musculoskeletal: Positive for back pain.   Blood pressure 129/74, pulse 109, temperature 98.7 F (37.1 C), temperature source Oral, resp. rate 22, height 5' 2" (1.575 m), weight 122 lb (55.339 kg), last menstrual period 11/01/2011, SpO2 95.00%. Physical Exam  Constitutional: No distress.  Thin female.  Eyes: EOM are normal. No scleral icterus.  Cardiovascular: Normal rate and regular rhythm.   Respiratory: Effort normal and breath sounds normal.  GI: She exhibits no mass. There is tenderness (in the right upper quadrant, epigastrium, left upper quadrant, right flank, left flank. The tenderness is equal in all those areas.). There is guarding ( In the right upper quadrant, right flank, epigastrium, left upper quadrant, and left flank.).  Small subumbilical scar. Lower transverse scar.  Musculoskeletal: She   exhibits no edema.  Neurological: She is alert.    Assessment/Plan: Epigastric, bilateral upper quadrant, and bilateral flank pain. Has gallstones. CT scan also suggested the possibility of a partial small bowel obstruction. She has significant comorbidities including cirrhosis and is still drinking alcohol. I feel she is a poor operative candidate.  Recommendation: I would treat her medically with bowel rest, IV antibiotics. I do not suggest surgery.  , J 12/08/2013,  5:20 PM      

## 2013-12-08 NOTE — H&P (Signed)
[facility] History and Physical  [name] FTD:[id number] DOB: [birth year] DOA: 12/08/2013  Referring physician: Dr. Wilson Singer PCP: Cathlean Cower, MD   Chief Complaint: Nausea  HPI: [name] is a 48 y.o. female  With history hepatitis C, depression, type II DM, and liver cirrhosis. Presents to the ED complaining of 1 day of nausea. Also associated with abdominal discomfort. Although patient does have history chronic abdominal discomfort. She states that the pain is sharp and is intermittent. Since onset of the discomfort has persisted and is associated with nausea. No change in bowel movements reported. She denies any bloody diarrhea. She denies any fevers. Nothing she is aware of makes it better. Has lost her appetite and reports not eating anything today. Yesterday she reports she was able to eat without any discomfort.  While in the ED patient had a CT of abdomen and pelvis are reported partial small bowel obstruction and findings suspicious for cholecystitis. General surgery was consulted and they subsequently recommended medical admission. As such we were consulted for further evaluation and recommendations.   Review of Systems:  Constitutional:  No weight loss, night sweats, Fevers, chills, fatigue.  HEENT:  No headaches, Difficulty swallowing,Tooth/dental problems,Sore throat,  No sneezing, itching, ear ache, nasal congestion, post nasal drip,  Cardio-vascular:  No chest pain, Orthopnea, PND, swelling in lower extremities, anasarca, dizziness, palpitations  GI:  No heartburn, indigestion, + abdominal pain,+ nausea,+ vomiting, diarrhea, change in bowel habits, + loss of appetite  Resp:  No shortness of breath with exertion or at rest. No excess mucus, no productive cough, No non-productive cough, No coughing up of blood.No change in color of mucus.No wheezing.No chest wall deformity  Skin:  no rash or lesions.  GU:  no dysuria, change in color of urine, no urgency or  frequency. No flank pain.  Musculoskeletal:  No joint pain or swelling. No decreased range of motion. No back pain.  Psych:  No change in mood or affect. No depression or anxiety. No memory loss.   Past Medical History  Diagnosis Date  . Impaired glucose tolerance 11/07/2010  . Hepatitis C approx dx 2005    no tx to date  . Pancreatitis     HX of  . Alcohol dependency   . GERD (gastroesophageal reflux disease)   . Chronic constipation   . Gallstones   . Drug dependency     Hx of cocaine use  . Depression   . Rheumatoid factor positive   . Hemochromatosis     last phlebotomy tx 2-3 yrs  . Pancytopenia   . Abnormal vaginal bleeding     uterine fibroid  . Gallstone 11/09/2010  . Chronic abdominal pain 11/09/2010  . Cirrhosis 11/09/2010  . Type II or unspecified type diabetes mellitus without mention of complication, uncontrolled 09/17/2011  . Anxiety 09/17/2011   Past Surgical History  Procedure Laterality Date  . Appendectomy    . Cesarean section  x 2  . Foot surgery  2013  . Abdominal hysterectomy  12/17/2011    Procedure: HYSTERECTOMY ABDOMINAL;  Surgeon: Gus Height, MD;  Location: [facility];  Service: Gynecology;  Laterality: N/A;  . Salpingoophorectomy  12/17/2011    Procedure: SALPINGO OOPHERECTOMY;  Surgeon: Gus Height, MD;  Location: [facility];  Service: Gynecology;  Laterality: Bilateral;   Social History:  reports that she quit smoking about 3 years ago. Her smoking use included Cigarettes. She smoked 0.00 packs per day. She has never used smokeless tobacco. She reports that  she drinks alcohol. She reports that she does not use illicit drugs.  Allergies  Allergen Reactions  . Iodinated Diagnostic Agents Hives    Pt states allergic to Betadine.    Family History  Problem Relation Age of Onset  . Cancer Mother     ovarian  . Diabetes Father      Prior to Admission medications   Medication Sig Start Date End Date Taking? Authorizing Provider  chlorpheniramine  (CHLOR-TRIMETON) 4 MG tablet Take 4 mg by mouth once as needed for allergies.   Yes Historical Provider, MD  esomeprazole (NEXIUM) 40 MG capsule Take 1 capsule (40 mg total) by mouth daily as needed (heart burn). 09/28/13  Yes Encarnacion Slates, NP  Ferrous Sulfate (IRON) 28 MG TABS Take 1 tablet by mouth daily.   Yes Historical Provider, MD  glucose blood test strip 1 each by Other route 3 (three) times daily. Use as instructed   Yes Historical Provider, MD  insulin aspart (NOVOLOG) 100 UNIT/ML injection Inject 5 Units into the skin 3 (three) times daily before meals.   Yes Historical Provider, MD  insulin glargine (LANTUS) 100 UNIT/ML injection Inject 15 Units into the skin at bedtime. @@ 10 pm   Yes Historical Provider, MD  Lancets MISC 1 each by Does not apply route 3 (three) times daily.   Yes Historical Provider, MD  linagliptin (TRADJENTA) 5 MG TABS tablet Take 1 tablet (5 mg total) by mouth daily. For diabetes 09/28/13  Yes Encarnacion Slates, NP  metFORMIN (GLUCOPHAGE) 500 MG tablet Take 500 mg by mouth 2 (two) times daily with a meal.   Yes Historical Provider, MD  Pumpkin Seed-Soy Germ (AZO BLADDER CONTROL/GO-LESS PO) Take 1 tablet by mouth once as needed (urinary tract symptoms).   Yes Historical Provider, MD  sertraline (ZOLOFT) 100 MG tablet Take 150 mg by mouth daily.   Yes Historical Provider, MD  tetrahydrozoline (VISINE) 0.05 % ophthalmic solution Place 1 drop into both eyes every morning.   Yes Historical Provider, MD  zolpidem (AMBIEN) 10 MG tablet Take 10 mg by mouth at bedtime as needed for sleep.   Yes Historical Provider, MD   Physical Exam: Filed Vitals:   12/08/13 1448 12/08/13 1508 12/08/13 1652  BP: 124/90  129/74  Pulse: 138 118 109  Temp: 98.7 F (37.1 C)    TempSrc: Oral    Resp: 18  22  Height: 5\' 2"  (1.575 m)    Weight: 55.339 kg (122 lb)    SpO2: 94% 100% 95%    Wt Readings from Last 3 Encounters:  12/08/13 55.339 kg (122 lb)  09/23/13 59.875 kg (132 lb)  05/19/13  64.012 kg (141 lb 1.9 oz)    General:  Appears calm and comfortable Eyes: PERRL, normal lids, irises & conjunctiva ENT: grossly normal hearing, lips & tongue Neck: no LAD, masses or thyromegaly Cardiovascular: RRR, no m/r/g. No LE edema. Telemetry: SR, no arrhythmias  Respiratory: CTA bilaterally, no w/r/r. Normal respiratory effort. Abdomen: soft, nd, generalized tenderness on palpation, positive bowel sounds (hypoactive) Skin: no rash or induration seen on limited exam Musculoskeletal: grossly normal tone BUE/BLE Psychiatric: grossly normal mood and affect, speech fluent and appropriate Neurologic: grossly non-focal.          Labs on Admission:  Basic Metabolic Panel:  Recent Labs Lab 12/08/13 1535  NA 139  K 3.8  CL 96  CO2 25  GLUCOSE 386*  BUN 4*  CREATININE 0.45*  CALCIUM 9.6   Liver  Function Tests:  Recent Labs Lab 12/08/13 1535  AST 149*  ALT 149*  ALKPHOS 127*  BILITOT 1.1  PROT 8.5*  ALBUMIN 3.7    Recent Labs Lab 12/08/13 1535  LIPASE 55   No results found for this basename: AMMONIA,  in the last 168 hours CBC:  Recent Labs Lab 12/08/13 1535  WBC 3.3*  NEUTROABS 1.8  HGB 14.7  HCT 42.8  MCV 102.1*  PLT 63*   Cardiac Enzymes: No results found for this basename: CKTOTAL, CKMB, CKMBINDEX, TROPONINI,  in the last 168 hours  BNP (last 3 results) No results found for this basename: PROBNP,  in the last 8760 hours CBG: No results found for this basename: GLUCAP,  in the last 168 hours  Radiological Exams on Admission: Ct Abdomen Pelvis Wo Contrast  12/08/2013   CLINICAL DATA:  Abdominal pain  EXAM: CT ABDOMEN AND PELVIS WITHOUT CONTRAST  TECHNIQUE: Multidetector CT imaging of the abdomen and pelvis was performed following the standard protocol without IV contrast.  COMPARISON:  CT abdomen pelvis 01/20/2011  FINDINGS: Progression of cirrhotic changes of the liver. The liver is enlarged with nodular contour diffusely. Gallbladder is  distended and contains a small gallstone. Gallbladder wall may be mildly thickened. Spleen is mildly enlarged.  Pancreas normal. 1 mm nonobstructing lower pole renal calculi. No renal mass or obstruction.  Proximal small bowel is mildly dilated with air-fluid levels suggesting partial proximal small bowel obstruction. Distal small bowel and colon are decompressed. Surgical clip at the base of the appendix. Negative for ascites.  IMPRESSION: Advanced cirrhosis. Spleen is mildly enlarged. Negative for ascites  Distended gallbladder with gallstone and mild pericholecystic fluid. Correlate with pain in this area  Mild dilatation of the jejunum with air-fluid levels suggesting partial obstruction of the proximal small bowel.   Electronically Signed   By: Franchot Gallo M.D.   On: 12/08/2013 16:31    Assessment/Plan Principal problems: Partial small bowel obstruction -General surgery on board and will defer management to them  Cholecystitis - Supportive therapy -As mentioned above general surgery on board  Active Problems:   HEPATITIS C/Cirrhosis - Stable, most likely cause of thrombocytopenia    Type II or unspecified type diabetes mellitus without mention of complication, uncontrolled - While n.p.o. will place on 4 hour CBG checks while on maintenance IV fluids    MDD (major depressive disorder) -Would favor continuing SSRI if patient can take medications while n.p.o. nursing to follow up with general surgery   Code Status: full DVT Prophylaxis: scd's Family Communication: None at bedside Disposition Plan: Med surg  Time spent: > 55 minutes  Velvet Bathe [facility] Pager [phone]  **Disclaimer: This note may have been dictated with voice recognition software. Similar sounding words can inadvertently be transcribed and this note may contain transcription errors which may not have been corrected upon publication of note.**

## 2013-12-08 NOTE — ED Notes (Signed)
Pt confirms EMS account, pt states she had @ 5 shots of Vodka today, no food.  Pt c/o RUQ pain radiating around to back and low mid abd pain. Pt does bright red emesis this am.

## 2013-12-08 NOTE — ED Notes (Signed)
Pt transported via EMS from her home with c/o RUQ pain x 1 year worse 3 mo ago. Pt states she took medication for DM this am, did not eat, only drank Vodka. Pt did vomit in ambulance, pt reports blood in vomit yesterday. A & O.  RUQ tender on palpation.

## 2013-12-09 ENCOUNTER — Observation Stay (HOSPITAL_COMMUNITY): Payer: Self-pay

## 2013-12-09 ENCOUNTER — Encounter (HOSPITAL_COMMUNITY): Payer: Self-pay | Admitting: Physician Assistant

## 2013-12-09 DIAGNOSIS — K7030 Alcoholic cirrhosis of liver without ascites: Secondary | ICD-10-CM

## 2013-12-09 DIAGNOSIS — K56609 Unspecified intestinal obstruction, unspecified as to partial versus complete obstruction: Principal | ICD-10-CM

## 2013-12-09 DIAGNOSIS — R1011 Right upper quadrant pain: Secondary | ICD-10-CM | POA: Diagnosis present

## 2013-12-09 DIAGNOSIS — R1084 Generalized abdominal pain: Secondary | ICD-10-CM

## 2013-12-09 DIAGNOSIS — K920 Hematemesis: Secondary | ICD-10-CM

## 2013-12-09 DIAGNOSIS — F10929 Alcohol use, unspecified with intoxication, unspecified: Secondary | ICD-10-CM | POA: Diagnosis present

## 2013-12-09 DIAGNOSIS — R110 Nausea: Secondary | ICD-10-CM

## 2013-12-09 LAB — RAPID URINE DRUG SCREEN, HOSP PERFORMED
Amphetamines: NOT DETECTED
BARBITURATES: NOT DETECTED
Benzodiazepines: NOT DETECTED
Cocaine: NOT DETECTED
Opiates: POSITIVE — AB
Tetrahydrocannabinol: NOT DETECTED

## 2013-12-09 LAB — GLUCOSE, CAPILLARY
GLUCOSE-CAPILLARY: 287 mg/dL — AB (ref 70–99)
Glucose-Capillary: 294 mg/dL — ABNORMAL HIGH (ref 70–99)
Glucose-Capillary: 229 mg/dL — ABNORMAL HIGH (ref 70–99)
Glucose-Capillary: 179 mg/dL — ABNORMAL HIGH (ref 70–99)
Glucose-Capillary: 92 mg/dL (ref 70–99)

## 2013-12-09 LAB — HEMOGLOBIN A1C
HEMOGLOBIN A1C: 9.5 % — AB (ref <5.7)
Mean Plasma Glucose: 226 mg/dL — ABNORMAL HIGH (ref <117)

## 2013-12-09 MED ORDER — THIAMINE HCL 100 MG/ML IJ SOLN
100.0000 mg | Freq: Every day | INTRAMUSCULAR | Status: DC
  Filled 2013-12-09 (×5): qty 1

## 2013-12-09 MED ORDER — HYDROMORPHONE HCL PF 1 MG/ML IJ SOLN
1.0000 mg | INTRAMUSCULAR | Status: DC | PRN
  Administered 2013-12-09 – 2013-12-11 (×13): 1 mg via INTRAVENOUS
  Filled 2013-12-09 (×13): qty 1

## 2013-12-09 MED ORDER — INSULIN ASPART 100 UNIT/ML ~~LOC~~ SOLN
5.0000 [IU] | Freq: Three times a day (TID) | SUBCUTANEOUS | Status: DC
  Administered 2013-12-09 – 2013-12-13 (×4): 5 [IU] via SUBCUTANEOUS

## 2013-12-09 MED ORDER — LORAZEPAM 1 MG PO TABS: 1.0000 mg | ORAL_TABLET | Freq: Four times a day (QID) | ORAL | Status: AC | PRN

## 2013-12-09 MED ORDER — INSULIN ASPART 100 UNIT/ML ~~LOC~~ SOLN
0.0000 [IU] | Freq: Three times a day (TID) | SUBCUTANEOUS | Status: DC
  Administered 2013-12-09: 3 [IU] via SUBCUTANEOUS
  Administered 2013-12-09 – 2013-12-10 (×2): 8 [IU] via SUBCUTANEOUS
  Administered 2013-12-11: 3 [IU] via SUBCUTANEOUS
  Administered 2013-12-11: 2 [IU] via SUBCUTANEOUS
  Administered 2013-12-12: 8 [IU] via SUBCUTANEOUS
  Administered 2013-12-12 – 2013-12-13 (×2): 5 [IU] via SUBCUTANEOUS

## 2013-12-09 MED ORDER — ADULT MULTIVITAMIN W/MINERALS CH
1.0000 | ORAL_TABLET | Freq: Every day | ORAL | Status: DC
  Administered 2013-12-09 – 2013-12-13 (×3): 1 via ORAL
  Filled 2013-12-09 (×5): qty 1

## 2013-12-09 MED ORDER — LORAZEPAM 2 MG/ML IJ SOLN
1.0000 mg | Freq: Four times a day (QID) | INTRAMUSCULAR | Status: AC | PRN
  Administered 2013-12-09 (×2): 1 mg via INTRAVENOUS
  Filled 2013-12-09 (×2): qty 1

## 2013-12-09 MED ORDER — PIPERACILLIN-TAZOBACTAM 3.375 G IVPB
3.3750 g | Freq: Three times a day (TID) | INTRAVENOUS | Status: DC
  Administered 2013-12-09 – 2013-12-13 (×13): 3.375 g via INTRAVENOUS
  Filled 2013-12-09 (×14): qty 50

## 2013-12-09 MED ORDER — FOLIC ACID 1 MG PO TABS
1.0000 mg | ORAL_TABLET | Freq: Every day | ORAL | Status: DC
  Administered 2013-12-09 – 2013-12-13 (×3): 1 mg via ORAL
  Filled 2013-12-09 (×5): qty 1

## 2013-12-09 MED ORDER — LINAGLIPTIN 5 MG PO TABS
5.0000 mg | ORAL_TABLET | Freq: Every day | ORAL | Status: DC
  Administered 2013-12-12 – 2013-12-13 (×2): 5 mg via ORAL
  Filled 2013-12-09 (×6): qty 1

## 2013-12-09 MED ORDER — INSULIN ASPART 100 UNIT/ML ~~LOC~~ SOLN
0.0000 [IU] | Freq: Every day | SUBCUTANEOUS | Status: DC
  Administered 2013-12-10: 2 [IU] via SUBCUTANEOUS

## 2013-12-09 MED ORDER — PANTOPRAZOLE SODIUM 40 MG IV SOLR
40.0000 mg | Freq: Two times a day (BID) | INTRAVENOUS | Status: DC
  Administered 2013-12-09 – 2013-12-13 (×9): 40 mg via INTRAVENOUS
  Filled 2013-12-09 (×10): qty 40

## 2013-12-09 MED ORDER — VITAMIN B-1 100 MG PO TABS
100.0000 mg | ORAL_TABLET | Freq: Every day | ORAL | Status: DC
  Administered 2013-12-09 – 2013-12-13 (×3): 100 mg via ORAL
  Filled 2013-12-09 (×5): qty 1

## 2013-12-09 MED ORDER — INSULIN GLARGINE 100 UNIT/ML ~~LOC~~ SOLN
15.0000 [IU] | Freq: Every day | SUBCUTANEOUS | Status: DC
  Administered 2013-12-10 – 2013-12-11 (×2): 15 [IU] via SUBCUTANEOUS
  Filled 2013-12-09 (×4): qty 0.15

## 2013-12-09 NOTE — Progress Notes (Signed)
ANTIBIOTIC CONSULT NOTE - INITIAL  Pharmacy Consult for Zosyn Indication: r/o acute cholecystitis  Allergies  Allergen Reactions  . Iodinated Diagnostic Agents Hives    Pt states allergic to Betadine.    Patient Measurements: Height: 5\' 2"  (157.5 cm) Weight: 122 lb (55.339 kg) IBW/kg (Calculated) : 50.1   Vital Signs: Temp: 98 F (36.7 C) (08/20 0550) Temp src: Oral (08/20 0550) BP: 121/76 mmHg (08/20 0550) Pulse Rate: 100 (08/20 0550) Intake/Output from previous day: 08/19 0701 - 08/20 0700 In: 675 [I.V.:675] Out: 500 [Urine:300; Emesis/NG output:200] Intake/Output from this shift:    Labs:  Recent Labs  12/08/13 1535 12/08/13 2025  WBC 3.3* 3.5*  HGB 14.7 13.8  PLT 63* 55*  CREATININE 0.45* 0.41*   Estimated Creatinine Clearance: 68 ml/min (by C-G formula based on Cr of 0.41).    Microbiology: No results found for this or any previous visit (from the past 720 hour(s)).  Medical History: Past Medical History  Diagnosis Date  . Impaired glucose tolerance 11/07/2010  . Hepatitis C approx dx 2005    no tx to date  . Pancreatitis     HX of  . Alcohol dependency   . GERD (gastroesophageal reflux disease)   . Chronic constipation   . Gallstones   . Drug dependency     Hx of cocaine use  . Depression   . Rheumatoid factor positive   . Hemochromatosis     last phlebotomy tx 2-3 yrs  . Pancytopenia   . Abnormal vaginal bleeding     uterine fibroid  . Gallstone 11/09/2010  . Chronic abdominal pain 11/09/2010  . Cirrhosis 11/09/2010  . Type II or unspecified type diabetes mellitus without mention of complication, uncontrolled 09/17/2011  . Anxiety 09/17/2011    Medications:  Scheduled:  . folic acid  1 mg Oral Daily  . insulin aspart  0-15 Units Subcutaneous TID WC  . insulin aspart  0-5 Units Subcutaneous QHS  . insulin aspart  5 Units Subcutaneous TID AC  . insulin glargine  15 Units Subcutaneous QHS  . linagliptin  5 mg Oral Daily  . multivitamin  with minerals  1 tablet Oral Daily  . piperacillin-tazobactam (ZOSYN)  IV  3.375 g Intravenous Q8H  . sertraline  150 mg Oral Daily  . thiamine  100 mg Oral Daily   Or  . thiamine  100 mg Intravenous Daily   Infusions:  . dextrose 5 % and 0.45 % NaCl with KCl 20 mEq/L 75 mL/hr at 12/09/13 0820   PRN: LORazepam, LORazepam, morphine injection, ondansetron (ZOFRAN) IV, ondansetron  Assessment: 48 y/o F presented to ED with nausea, CT demonstrated partial SBO and possible cholecystitis.  Received Zosyn x 1 dose in ED the evening of 8/19.  Zosyn resumed 8/20 with pharmacy dosing assistance requested.  Goal of Therapy:  Appropriate antibiotic dosing for renal function; eradication of infection.   Plan:  1. Zosyn 3.375 grams IV q8h (extended-infusion, each dose over 4 hours). 2. Follow serum creatinine, clinical course.   Clayburn Pert, PharmD, BCPS Pager: [phone] 12/09/2013  9:22 AM

## 2013-12-09 NOTE — Progress Notes (Addendum)
Patient ID: [name], female   DOB: [birth year], 48 y.o.   MRN: [id number] [facility] PROGRESS NOTE  [name] [id number]:[id number] DOB: 12-12-1965 DOA: 12/08/2013 PCP: Cathlean Cower, MD  Brief narrative: 48 y.o. female with past medical history of hepatitis C, liver cirrhosis, depression, DM type II who presented to [facility] ED 12/08/2013 with right upper quadrant abdominal pain, sharp, intermittent, 10/10 in intensity when the pain is present. Pain is assocaited with nausea and poor oral intake. No fevers or chills. No diarrhea. Work up in ED included CT abdomen which showed findings suspicious for possible cholecystitis and partial small bowel obstruction. In addition, her alcohol level was 172 on this admission. AST was 149, ALT was 149, ALP 127, normal bilirubin and normal lipase level.  Assessment and Plan:    Principal Problem:   Abdominal pain, right upper quadrant / Possible acute cholecystitis / Possible partial small bowel obstruction  Possible acute cholecystitis or partial small bowel obstruction. CT abdomen revealed distended gallbladder with gallstone and mild pericholecystic fluid. Also seen was partial small bowel obstruction.Order placed for Abdominal US for further evaluation of possible cholecystitis.   Zosyn empirically for possible cholecystitis.  Appreciate surgery following and their recommendations. In regards to possible SBO continue conservative management with IV fluids, NG tube, analgesia and antiemetics PRN  Active Problems:   Partial small bowel obstruction  As seen on CT abdomen. Conservative management with IV fluids, analgesia and antiemetics PRN.  NG tube in place.    Possible acute cholecystitis  Distended gallbladder seen on CT abdomen with pericholecystic fluid. Abd [place] order for further evaluation.  GI consulted for input on management.  Continue empiric zosyn.  Trend LFT's. AST 149 --> 127; ALT 149 --> 127; ALP 127 --> 117; normal lipase and  normal bilirubin level.   Thrombocytopenia  Likely bone marrow suppression from alcohol abuse  Platelets 63 on admission. Continue to monitor CBC.   Acute alcohol intoxication  CIWA protocol order in place. Alcohol level 172 on this admission  Monitor for withdrawals  IV fluids, MVI, thiamine, folic acid per CIWA protocol.    Liver cirrhosis in the setting of hepatitis C and alcohol induced hepatitis  History of alcohol and substance abuse.  Alcohol 172 on this admission  LFT's somewhat better this am.   Diabetes mellitus  Check A1c  Restart home insulin regimen, Lantus 15 units daily and novolog 5 units TIDAC  Start tradjenta  Start SSI   MDD (major depressive disorder)  Continue sertraline    DVT prophylaxis  SCD's due to thrombocytopenia.   History of substance abuse  Check UDS  SW consulted for substance abuse to provide resources   Code Status: Full Family Communication: Pt at bedside Disposition Plan: Home when medically stable  IV Access:   Peripheral IV Procedures and diagnostic studies:   Ct Abdomen Pelvis Wo Contrast  12/08/2013 Advanced cirrhosis. Spleen is mildly enlarged. Negative for ascites  Distended gallbladder with gallstone and mild pericholecystic fluid. Correlate with pain in this area  Mild dilatation of the jejunum with air-fluid levels suggesting partial obstruction of the proximal small bowel.     Abdominal ultrasound 12/09/2013 Medical Consultants:   Surgery  GI (Dr. Kennedy Bucker) Other Consultants:   Physical therapy  Social work for substance abuse Anti-Infectives:   Zosyn 12/08/2013 -->   HPI/Subjective: No events overnight.   Objective: Filed Vitals:   12/08/13 2133 12/09/13 0230 12/09/13 0238 12/09/13 0550  BP: 108/65 93/59 121/68 121/76  Pulse:  90 114 108 100  Temp: 98.3 F (36.8 C) 98 F (36.7 C)  98 F (36.7 C)  TempSrc: Oral Oral  Oral  Resp: 16 16  16   Height:      Weight:      SpO2: 94% 80% 100% 99%     Intake/Output Summary (Last 24 hours) at 12/09/13 0837 Last data filed at 12/09/13 0600  Gross per 24 hour  Intake    675 ml  Output    500 ml  Net    175 ml    Exam:   General:  Pt is alert, follows commands appropriately, not in acute distress  Cardiovascular: Regular rate and rhythm, S1/S2, no murmurs, no rubs, no gallops  Respiratory: Clear to auscultation bilaterally, no wheezing, no crackles, no rhonchi  Abdomen: Soft, tender in RUQ, non distended, bowel sounds present, no guarding, NG tube in place  Extremities: No edema, pulses DP and PT palpable bilaterally  Neuro: Grossly nonfocal  Data Reviewed: Basic Metabolic Panel:  Recent Labs Lab 12/08/13 1535 12/08/13 2025  NA 139 141  K 3.8 3.5*  CL 96 100  CO2 25 27  GLUCOSE 386* 248*  BUN 4* 4*  CREATININE 0.45* 0.41*  CALCIUM 9.6 9.2   Liver Function Tests:  Recent Labs Lab 12/08/13 1535 12/08/13 2025  AST 149* 127*  ALT 149* 136*  ALKPHOS 127* 117  BILITOT 1.1 1.0  PROT 8.5* 7.7  ALBUMIN 3.7 3.3*    Recent Labs Lab 12/08/13 1535  LIPASE 55   No results found for this basename: AMMONIA,  in the last 168 hours CBC:  Recent Labs Lab 12/08/13 1535 12/08/13 2025  WBC 3.3* 3.5*  NEUTROABS 1.8  --   HGB 14.7 13.8  HCT 42.8 39.9  MCV 102.1* 100.8*  PLT 63* 55*   Cardiac Enzymes: No results found for this basename: CKTOTAL, CKMB, CKMBINDEX, TROPONINI,  in the last 168 hours BNP: No components found with this basename: POCBNP,  CBG:  Recent Labs Lab 12/08/13 2000 12/08/13 2344 12/09/13 0425 12/09/13 0754  GLUCAP 246* 243* 287* 294*    No results found for this or any previous visit (from the past 240 hour(s)).   Scheduled Meds: . insulin aspart  0-9 Units Subcutaneous TID WC  . piperacillin-tazobactam (ZOSYN)  IV  3.375 g Intravenous Q8H  . sertraline  150 mg Oral Daily   Continuous Infusions: . dextrose 5 % and 0.45 % NaCl with KCl 20 mEq/L 75 mL/hr at 12/09/13 0820      Faye Ramsay, MD  [facility] Pager [phone]  If 7PM-7AM, please contact night-coverage [email or web address] Password [facility] 12/09/2013, 8:37 AM   LOS: 1 day

## 2013-12-09 NOTE — Progress Notes (Signed)
INITIAL NUTRITION ASSESSMENT  DOCUMENTATION CODES Per approved criteria  -Not Applicable   INTERVENTION: - Diet advancement per MD - RD to continue to monitor   NUTRITION DIAGNOSIS: Inadequate oral intake related to inability to eat as evidenced by NPO.   Goal: Advance diet as tolerated to diabetic diet  Monitor:  Weights, labs, diet advancement, NGT output   Reason for Assessment: Malnutrition screening tool   48 y.o. female  Admitting Dx: Abdominal pain, right upper quadrant  ASSESSMENT: Pt with chronic right-sided abdominal pain for many months. Over the past week she states become worse. She hurts in the right upper quadrant, right flank, left upper quadrant, left flank, and epigastric region. She's had nausea and vomiting. Some of the vomiting has been bloody. She has history of alcohol abuse and has been drinking. She also has a history of cirrhosis, hepatitis C, type II DM, pancreatitis, polysubstance abuse, hemachromatosis, chronic abdominal pain. She is chronically elevated liver function tests as well. Has gallstones. CT scan also suggested the possibility of a partial small bowel obstruction per MD.   - On CIWA protocol  - Has NGT in place, 232m brown output total yesterday - Met with pt who reports eating 3 meals/day at home, likes to eat fruit - Said she's lost 45 pounds unintentionally in the past 7 months which she attributes to changes in diabetes medications and walking more (20 minutes per day, twice a day) - Said she drinks water at home  - Denies any nausea today but still c/o abdominal pain and reports no improvement in abdominal pain   AST/ALT elevated    Height: Ht Readings from Last 1 Encounters:  12/08/13 5' 2" (1.575 m)    Weight: Wt Readings from Last 1 Encounters:  12/08/13 122 lb (55.339 kg)    Ideal Body Weight: 110 lbs  % Ideal Body Weight: 111%  Wt Readings from Last 10 Encounters:  12/08/13 122 lb (55.339 kg)  09/23/13 132 lb  (59.875 kg)  05/19/13 141 lb 1.9 oz (64.012 kg)  04/13/13 141 lb (63.957 kg)  04/08/13 139 lb (63.05 kg)  02/24/13 140 lb (63.504 kg)  02/18/13 142 lb 6 oz (64.581 kg)  01/23/13 134 lb 4.8 oz (60.918 kg)  01/13/13 139 lb 4 oz (63.163 kg)  12/16/12 130 lb (58.968 kg)    Usual Body Weight: 167 lbs 7 months ago per pt  % Usual Body Weight: 73%  BMI:  Body mass index is 22.31 kg/(m^2).  Estimated Nutritional Needs: Kcal: 1400-1600 Protein: 65-80g Fluid: per MD  Skin: intact   Diet Order: NPO  EDUCATION NEEDS: -No education needs identified at this time   Intake/Output Summary (Last 24 hours) at 12/09/13 0906 Last data filed at 12/09/13 0600  Gross per 24 hour  Intake    675 ml  Output    500 ml  Net    175 ml    Last BM: 8/19  Labs:   Recent Labs Lab 12/08/13 1535 12/08/13 2025  NA 139 141  K 3.8 3.5*  CL 96 100  CO2 25 27  BUN 4* 4*  CREATININE 0.45* 0.41*  CALCIUM 9.6 9.2  GLUCOSE 386* 248*    CBG (last 3)   Recent Labs  12/08/13 2344 12/09/13 0425 12/09/13 0754  GLUCAP 243* 287* 294*    Scheduled Meds: . folic acid  1 mg Oral Daily  . insulin aspart  0-15 Units Subcutaneous TID WC  . insulin aspart  0-5 Units Subcutaneous QHS  .  insulin aspart  5 Units Subcutaneous TID AC  . insulin glargine  15 Units Subcutaneous QHS  . linagliptin  5 mg Oral Daily  . multivitamin with minerals  1 tablet Oral Daily  . piperacillin-tazobactam (ZOSYN)  IV  3.375 g Intravenous Q8H  . sertraline  150 mg Oral Daily  . thiamine  100 mg Oral Daily   Or  . thiamine  100 mg Intravenous Daily    Continuous Infusions: . dextrose 5 % and 0.45 % NaCl with KCl 20 mEq/L 75 mL/hr at 12/09/13 0277    Past Medical History  Diagnosis Date  . Impaired glucose tolerance 11/07/2010  . Hepatitis C approx dx 2005    no tx to date  . Pancreatitis     HX of  . Alcohol dependency   . GERD (gastroesophageal reflux disease)   . Chronic constipation   . Gallstones   .  Drug dependency     Hx of cocaine use  . Depression   . Rheumatoid factor positive   . Hemochromatosis     last phlebotomy tx 2-3 yrs  . Pancytopenia   . Abnormal vaginal bleeding     uterine fibroid  . Gallstone 11/09/2010  . Chronic abdominal pain 11/09/2010  . Cirrhosis 11/09/2010  . Type II or unspecified type diabetes mellitus without mention of complication, uncontrolled 09/17/2011  . Anxiety 09/17/2011    Past Surgical History  Procedure Laterality Date  . Appendectomy    . Cesarean section  x 2  . Foot surgery  2013  . Abdominal hysterectomy  12/17/2011    Procedure: HYSTERECTOMY ABDOMINAL;  Surgeon: Gus Height, MD;  Location: [facility];  Service: Gynecology;  Laterality: N/A;  . Salpingoophorectomy  12/17/2011    Procedure: SALPINGO OOPHERECTOMY;  Surgeon: Gus Height, MD;  Location: [facility];  Service: Gynecology;  Laterality: Bilateral;    Carlis Stable MS, [place], [place] Pager [phone] Weekend/After Hours Pager

## 2013-12-09 NOTE — Progress Notes (Signed)
Subjective: Pain is better.  Still diffuse upper abdomen when it occurs.  Objective: Vital signs in last 24 hours: Temp:  [98 F (36.7 C)-99 F (37.2 C)] 98 F (36.7 C) (08/20 0550) Pulse Rate:  [90-138] 100 (08/20 0550) Resp:  [16-22] 16 (08/20 0550) BP: (93-129)/(59-90) 121/76 mmHg (08/20 0550) SpO2:  [80 %-100 %] 99 % (08/20 0550) Weight:  [122 lb (55.339 kg)] 122 lb (55.339 kg) (08/19 1909) Last BM Date: 12/08/13  Intake/Output from previous day: 08/19 0701 - 08/20 0700 In: 675 [I.V.:675] Out: 500 [Urine:300; Emesis/NG output:200-bloody Intake/Output this shift:    PE: General- In NAD Abdomen-soft, less tender today  Lab Results:   Recent Labs  12/08/13 1535 12/08/13 2025  WBC 3.3* 3.5*  HGB 14.7 13.8  HCT 42.8 39.9  PLT 63* 55*   BMET  Recent Labs  12/08/13 1535 12/08/13 2025  NA 139 141  K 3.8 3.5*  CL 96 100  CO2 25 27  GLUCOSE 386* 248*  BUN 4* 4*  CREATININE 0.45* 0.41*  CALCIUM 9.6 9.2   PT/INR  Recent Labs  12/08/13 1656  LABPROT 14.6  INR 1.14   Comprehensive Metabolic Panel:    Component Value Date/Time   NA 141 12/08/2013 2025   NA 139 12/08/2013 1535   K 3.5* 12/08/2013 2025   K 3.8 12/08/2013 1535   CL 100 12/08/2013 2025   CL 96 12/08/2013 1535   CO2 27 12/08/2013 2025   CO2 25 12/08/2013 1535   BUN 4* 12/08/2013 2025   BUN 4* 12/08/2013 1535   CREATININE 0.41* 12/08/2013 2025   CREATININE 0.45* 12/08/2013 1535   GLUCOSE 248* 12/08/2013 2025   GLUCOSE 386* 12/08/2013 1535   CALCIUM 9.2 12/08/2013 2025   CALCIUM 9.6 12/08/2013 1535   AST 127* 12/08/2013 2025   AST 149* 12/08/2013 1535   ALT 136* 12/08/2013 2025   ALT 149* 12/08/2013 1535   ALKPHOS 117 12/08/2013 2025   ALKPHOS 127* 12/08/2013 1535   BILITOT 1.0 12/08/2013 2025   BILITOT 1.1 12/08/2013 1535   PROT 7.7 12/08/2013 2025   PROT 8.5* 12/08/2013 1535   ALBUMIN 3.3* 12/08/2013 2025   ALBUMIN 3.7 12/08/2013 1535     Studies/Results: Ct Abdomen Pelvis Wo  Contrast  12/08/2013   CLINICAL DATA:  Abdominal pain  EXAM: CT ABDOMEN AND PELVIS WITHOUT CONTRAST  TECHNIQUE: Multidetector CT imaging of the abdomen and pelvis was performed following the standard protocol without IV contrast.  COMPARISON:  CT abdomen pelvis 01/20/2011  FINDINGS: Progression of cirrhotic changes of the liver. The liver is enlarged with nodular contour diffusely. Gallbladder is distended and contains a small gallstone. Gallbladder wall may be mildly thickened. Spleen is mildly enlarged.  Pancreas normal. 1 mm nonobstructing lower pole renal calculi. No renal mass or obstruction.  Proximal small bowel is mildly dilated with air-fluid levels suggesting partial proximal small bowel obstruction. Distal small bowel and colon are decompressed. Surgical clip at the base of the appendix. Negative for ascites.  IMPRESSION: Advanced cirrhosis. Spleen is mildly enlarged. Negative for ascites  Distended gallbladder with gallstone and mild pericholecystic fluid. Correlate with pain in this area  Mild dilatation of the jejunum with air-fluid levels suggesting partial obstruction of the proximal small bowel.   Electronically Signed   By: Franchot Gallo M.D.   On: 12/08/2013 16:31    Anti-infectives: Anti-infectives   Start     Dose/Rate Route Frequency Ordered Stop   12/08/13 1700  piperacillin-tazobactam (ZOSYN) IVPB 3.375 g  Status:  Discontinued     3.375 g 100 mL/hr over 30 Minutes Intravenous  Once 12/08/13 1648 12/08/13 1711      Assessment Active Problems:   Upper abdominal pain-improved   Cholelithiasis with ? Acute cholecystitis   HEPATITIS C   Cirrhosis   Type II or unspecified type diabetes mellitus without mention of complication, uncontrolled   MDD (major depressive disorder)   Cholecystitis   Partial small bowel obstruction-had hematemesis at home and now bloody ng drainage.    LOS: 1 day   Plan: Check [place].  Empiric abxs.  Would recommend GI consult for hematemesis and  bloody ng drainage.   [name] 12/09/2013

## 2013-12-09 NOTE — Consult Note (Signed)
Referring Provider: No ref. provider found Primary Care Physician:  Cathlean Cower, MD Primary Gastroenterologist:  ? Dr. Deatra Ina, unassigned  Reason for Consultation:  Elevated LFT's; hematemesis  HPI: [name] is a 48 y.o. female with history hepatitis C, depression, type II DM, ETOH abuse, and liver cirrhosis. Presents to the ED complaining of abdominal pain and vomiting blood.  She states that the pain started about 3 days ago and is sharp across her mid-abdomen and into her back.  She reports that she's been vomiting blood intermittently for the past year, but it started about 3 days ago and has not stopped.  No change in bowel movements reported including dark or bloody stools.  CT scan abdomen and pelvis with contrast showed the following:  IMPRESSION:  Advanced cirrhosis. Spleen is mildly enlarged. Negative for ascites.  Distended gallbladder with gallstone and mild pericholecystic fluid.  Correlate with pain in this area.  Mild dilatation of the jejunum with air-fluid levels suggesting partial obstruction of the proximal small bowel.  Ultrasound shows the following:  "IMPRESSION:  1. Cholelithiasis with borderline/mild gallbladder wall thickening.  No pericholecystic edema or tenderness with gallbladder palpation to confirm acute cholecystitis.  2. Cirrhosis with trace perihepatic ascites."  Surgery has been seeing her.  NGT was placed and there is dark red blood present in the tube and canister.  Hgb is normal at 13.8 grams.  LFT's are elevated, mostly transaminases, but are actually improved from a few months ago.  Total bili is normal at 1.0, ALP is normal at 117, AST elevated at 127, and ALT elevated at 136.  BUN is not elevated.  06/2009 normal EGD by Dr. Deatra Ina.  She has never been treated for her Hep C and has not seen anyone about it any time recently.   Past Medical History  Diagnosis Date  . Impaired glucose tolerance 11/07/2010  . Hepatitis C approx dx 2005   no tx to date  . Pancreatitis     HX of  . Alcohol dependency   . GERD (gastroesophageal reflux disease)   . Chronic constipation   . Gallstones   . Drug dependency     Hx of cocaine use  . Depression   . Rheumatoid factor positive   . Hemochromatosis     last phlebotomy tx 2-3 yrs  . Pancytopenia   . Abnormal vaginal bleeding     uterine fibroid  . Gallstone 11/09/2010  . Chronic abdominal pain 11/09/2010  . Cirrhosis 11/09/2010  . Type II or unspecified type diabetes mellitus without mention of complication, uncontrolled 09/17/2011  . Anxiety 09/17/2011    Past Surgical History  Procedure Laterality Date  . Appendectomy    . Cesarean section  x 2  . Foot surgery  2013  . Abdominal hysterectomy  12/17/2011    Procedure: HYSTERECTOMY ABDOMINAL;  Surgeon: Gus Height, MD;  Location: [facility];  Service: Gynecology;  Laterality: N/A;  . Salpingoophorectomy  12/17/2011    Procedure: SALPINGO OOPHERECTOMY;  Surgeon: Gus Height, MD;  Location: [facility];  Service: Gynecology;  Laterality: Bilateral;    Prior to Admission medications   Medication Sig Start Date End Date Taking? Authorizing Provider  chlorpheniramine (CHLOR-TRIMETON) 4 MG tablet Take 4 mg by mouth once as needed for allergies.   Yes Historical Provider, MD  esomeprazole (NEXIUM) 40 MG capsule Take 1 capsule (40 mg total) by mouth daily as needed (heart burn). 09/28/13  Yes Encarnacion Slates, NP  Ferrous Sulfate (IRON) 28 MG  TABS Take 1 tablet by mouth daily.   Yes Historical Provider, MD  glucose blood test strip 1 each by Other route 3 (three) times daily. Use as instructed   Yes Historical Provider, MD  insulin aspart (NOVOLOG) 100 UNIT/ML injection Inject 5 Units into the skin 3 (three) times daily before meals.   Yes Historical Provider, MD  insulin glargine (LANTUS) 100 UNIT/ML injection Inject 15 Units into the skin at bedtime. @@ 10 pm   Yes Historical Provider, MD  Lancets MISC 1 each by Does not apply route 3 (three) times  daily.   Yes Historical Provider, MD  linagliptin (TRADJENTA) 5 MG TABS tablet Take 1 tablet (5 mg total) by mouth daily. For diabetes 09/28/13  Yes Encarnacion Slates, NP  metFORMIN (GLUCOPHAGE) 500 MG tablet Take 500 mg by mouth 2 (two) times daily with a meal.   Yes Historical Provider, MD  Pumpkin Seed-Soy Germ (AZO BLADDER CONTROL/GO-LESS PO) Take 1 tablet by mouth once as needed (urinary tract symptoms).   Yes Historical Provider, MD  sertraline (ZOLOFT) 100 MG tablet Take 150 mg by mouth daily.   Yes Historical Provider, MD  tetrahydrozoline (VISINE) 0.05 % ophthalmic solution Place 1 drop into both eyes every morning.   Yes Historical Provider, MD  zolpidem (AMBIEN) 10 MG tablet Take 10 mg by mouth at bedtime as needed for sleep.   Yes Historical Provider, MD    Current Facility-Administered Medications  Medication Dose Route Frequency Provider Last Rate Last Dose  . dextrose 5 % and 0.45 % NaCl with KCl 20 mEq/L infusion   Intravenous Continuous Velvet Bathe, MD 75 mL/hr at 12/09/13 0820    . folic acid (FOLVITE) tablet 1 mg  1 mg Oral Daily Theodis Blaze, MD   1 mg at 12/09/13 1011  . insulin aspart (novoLOG) injection 0-15 Units  0-15 Units Subcutaneous TID WC Theodis Blaze, MD      . insulin aspart (novoLOG) injection 0-5 Units  0-5 Units Subcutaneous QHS Theodis Blaze, MD      . insulin aspart (novoLOG) injection 5 Units  5 Units Subcutaneous TID AC Theodis Blaze, MD      . insulin glargine (LANTUS) injection 15 Units  15 Units Subcutaneous QHS Theodis Blaze, MD      . linagliptin (TRADJENTA) tablet 5 mg  5 mg Oral Daily Theodis Blaze, MD      . LORazepam (ATIVAN) tablet 1 mg  1 mg Oral Q6H PRN Theodis Blaze, MD       Or  . LORazepam (ATIVAN) injection 1 mg  1 mg Intravenous Q6H PRN Theodis Blaze, MD   1 mg at 12/09/13 1017  . morphine 2 MG/ML injection 2 mg  2 mg Intravenous Q3H PRN Velvet Bathe, MD   2 mg at 12/09/13 1010  . multivitamin with minerals tablet 1 tablet  1 tablet Oral  Daily Theodis Blaze, MD   1 tablet at 12/09/13 1011  . ondansetron (ZOFRAN) tablet 4 mg  4 mg Oral Q6H PRN Velvet Bathe, MD       Or  . ondansetron (ZOFRAN) injection 4 mg  4 mg Intravenous Q6H PRN Velvet Bathe, MD      . piperacillin-tazobactam (ZOSYN) IVPB 3.375 g  3.375 g Intravenous Q8H Odis Hollingshead, MD   3.375 g at 12/09/13 0931  . sertraline (ZOLOFT) tablet 150 mg  150 mg Oral Daily Velvet Bathe, MD   150 mg at 12/09/13  1011  . thiamine (VITAMIN B-1) tablet 100 mg  100 mg Oral Daily Theodis Blaze, MD   100 mg at 12/09/13 1011   Or  . thiamine (B-1) injection 100 mg  100 mg Intravenous Daily Theodis Blaze, MD        Allergies as of 12/08/2013 - Review Complete 12/08/2013  Allergen Reaction Noted  . Iodinated diagnostic agents Hives 10/31/2010    Family History  Problem Relation Age of Onset  . Cancer Mother     ovarian  . Diabetes Father     History   Social History  . Marital Status: Married    Spouse Name: N/A    Number of Children: 3  . Years of Education: N/A   Occupational History  . formed [employer]     now staying home   Social History Main Topics  . Smoking status: Former Smoker    Types: Cigarettes    Quit date: 12/09/2010  . Smokeless tobacco: Never Used  . Alcohol Use: Yes     Comment: "drink every other day"  . Drug Use: No  . Sexual Activity: Not Currently    Birth Control/ Protection: None   Other Topics Concern  . Not on file   Social History Narrative  . No narrative on file    Review of Systems: Ten point ROS is O/W negative except as mentioned in HPI.  Physical Exam: Vital signs in last 24 hours: Temp:  [98 F (36.7 C)-99 F (37.2 C)] 98 F (36.7 C) (08/20 0550) Pulse Rate:  [90-138] 100 (08/20 0550) Resp:  [16-22] 16 (08/20 0550) BP: (93-129)/(59-90) 121/76 mmHg (08/20 0550) SpO2:  [80 %-100 %] 99 % (08/20 0550) Weight:  [122 lb (55.339 kg)] 122 lb (55.339 kg) (08/19 1909) Last BM Date: 12/08/13 General:  Alert,  Well-developed, cooperative in NAD Head:  Normocephalic and atraumatic. Eyes:  Sclera clear, no icterus.  Conjunctiva pink. Ears:  Normal auditory acuity. Nose:  NGT with dark red colored blood Mouth:  No deformity or lesions.  Lungs:  Clear throughout to auscultation.  No wheezes, crackles, or rhonchi.  Heart:  Regular rate and rhythm; no murmurs, clicks, rubs, or gallops. Abdomen:  Soft, non-distended.  BS present and somewhat tinkling.  Mild diffuse TTP without R/R/G.   Rectal:  Deferred  Msk:  Symmetrical without gross deformities. Pulses:  Normal pulses noted. Extremities:  Without clubbing or edema. Neurologic:  Alert and  oriented x4;  grossly normal neurologically. Skin:  Intact without significant lesions or rashes.  Intake/Output from previous day: 08/19 0701 - 08/20 0700 In: 675 [I.V.:675] Out: 500 [Urine:300; Emesis/NG output:200]  Lab Results:  Recent Labs  12/08/13 1535 12/08/13 2025  WBC 3.3* 3.5*  HGB 14.7 13.8  HCT 42.8 39.9  PLT 63* 55*   BMET  Recent Labs  12/08/13 1535 12/08/13 2025  NA 139 141  K 3.8 3.5*  CL 96 100  CO2 25 27  GLUCOSE 386* 248*  BUN 4* 4*  CREATININE 0.45* 0.41*  CALCIUM 9.6 9.2   LFT  Recent Labs  12/08/13 2025  PROT 7.7  ALBUMIN 3.3*  AST 127*  ALT 136*  ALKPHOS 117  BILITOT 1.0   PT/INR  Recent Labs  12/08/13 1656  LABPROT 14.6  INR 1.14   Studies/Results: Ct Abdomen Pelvis Wo Contrast  12/08/2013   CLINICAL DATA:  Abdominal pain  EXAM: CT ABDOMEN AND PELVIS WITHOUT CONTRAST  TECHNIQUE: Multidetector CT imaging of the abdomen and pelvis  was performed following the standard protocol without IV contrast.  COMPARISON:  CT abdomen pelvis 01/20/2011  FINDINGS: Progression of cirrhotic changes of the liver. The liver is enlarged with nodular contour diffusely. Gallbladder is distended and contains a small gallstone. Gallbladder wall may be mildly thickened. Spleen is mildly enlarged.  Pancreas normal. 1 mm  nonobstructing lower pole renal calculi. No renal mass or obstruction.  Proximal small bowel is mildly dilated with air-fluid levels suggesting partial proximal small bowel obstruction. Distal small bowel and colon are decompressed. Surgical clip at the base of the appendix. Negative for ascites.  IMPRESSION: Advanced cirrhosis. Spleen is mildly enlarged. Negative for ascites  Distended gallbladder with gallstone and mild pericholecystic fluid. Correlate with pain in this area  Mild dilatation of the jejunum with air-fluid levels suggesting partial obstruction of the proximal small bowel.   Electronically Signed   By: Franchot Gallo M.D.   On: 12/08/2013 16:31   US Abdomen Limited Ruq  12/09/2013   CLINICAL DATA:  Right upper quadrant pain.  Gallstone on CT.  EXAM: US ABDOMEN LIMITED - RIGHT UPPER QUADRANT  COMPARISON:  CT 1 day prior  FINDINGS: Gallbladder:  9 mm stone. Borderline/mild gallbladder wall thickening, 4 mm. Gallbladder distention, mild. Sonographic Murphy's sign was not elicited. No pericholecystic fluid.  Common bile duct:  Diameter: Normal, 5 mm.  Liver:  Cirrhosis.  Small volume perihepatic ascites.  IMPRESSION: 1. Cholelithiasis with borderline/mild gallbladder wall thickening. No pericholecystic edema or tenderness with gallbladder palpation to confirm acute cholecystitis. 2. Cirrhosis with trace perihepatic ascites.   Electronically Signed   By: Abigail Miyamoto M.D.   On: 12/09/2013 10:58    IMPRESSION:  -Elevated LFT's:  Patient has hepatitis C and cirrhosis with ETOH abuse.  LFT's are actually stable compared to 8 months ago.  Has cholelithiasis/mild gallbladder wall thickening, but no findings c/w acute cholecystitis. -Abdominal pain and nausea:  Sudden onset.  Questionable PSBO on CT scan.  NGT in place with bloody drainage, which may be NGT drama.  No history of varices on EGD in 2011 or suggestion of varices on CT scan.  -Hematemesis, intermittent for the past year but increased  recently:  No history of varices, but need to rule that out including ulcer disease, etc.  PLAN: -Will start PPI IV BID for now. -Plan for EGD on 8/21. -Trend LFT's. -? Need for HIDA scan to determine if gallbladder issue causing pain/nausea. -Will check AFP and hep C viral load.  Will need to follow-up with infectious disease or liver clinic to discuss possibility of treatment.   ZEHR, JESSICA D.  12/09/2013, 11:40 AM  Pager number [phone]      Attending physician's note   I have taken a history, examined the patient and reviewed the chart. I agree with the Advanced Practitioner's note, impression and recommendations. Back pain and diffuse abdominal pain with cholelithiasis, cirrhosis, Hep C, alcohol abuse, mildly elevated LFTs. IV PPI bid. Consider HIDA to R/O cholecystitis-defer to General Surgery. Continue NGT suction for possible partial SBO or ileus. EGD on Friday for hematemesis. The risks, benefits, and alternatives to endoscopy with possible biopsy and possible dilation were discussed with the patient and they consent to proceed.    Ladene Artist, MD [facility]

## 2013-12-10 ENCOUNTER — Encounter (HOSPITAL_COMMUNITY): Payer: Self-pay

## 2013-12-10 ENCOUNTER — Encounter (HOSPITAL_COMMUNITY)
Admission: EM | Disposition: A | Discharge status: Home / Self Care | Payer: Self-pay | Source: Home / Self Care | Attending: Internal Medicine

## 2013-12-10 DIAGNOSIS — R1011 Right upper quadrant pain: Secondary | ICD-10-CM

## 2013-12-10 DIAGNOSIS — D696 Thrombocytopenia, unspecified: Secondary | ICD-10-CM

## 2013-12-10 HISTORY — PX: ESOPHAGOGASTRODUODENOSCOPY: SHX5428

## 2013-12-10 LAB — COMPREHENSIVE METABOLIC PANEL
ALK PHOS: 101 U/L (ref 39–117)
ALT: 96 U/L — ABNORMAL HIGH (ref 0–35)
AST: 75 U/L — ABNORMAL HIGH (ref 0–37)
Albumin: 2.9 g/dL — ABNORMAL LOW (ref 3.5–5.2)
Anion gap: 8 (ref 5–15)
BILIRUBIN TOTAL: 1.3 mg/dL — AB (ref 0.3–1.2)
BUN: 7 mg/dL (ref 6–23)
CALCIUM: 9.2 mg/dL (ref 8.4–10.5)
CO2: 32 mEq/L (ref 19–32)
Chloride: 97 mEq/L (ref 96–112)
Creatinine, Ser: 0.53 mg/dL (ref 0.50–1.10)
GFR calc non Af Amer: >90 mL/min (ref >90)
GLUCOSE: 288 mg/dL — AB (ref 70–99)
Potassium: 4.2 mEq/L (ref 3.7–5.3)
Sodium: 137 mEq/L (ref 137–147)
Total Protein: 7.1 g/dL (ref 6.0–8.3)

## 2013-12-10 LAB — CBC
HCT: 42.1 % (ref 36.0–46.0)
Hemoglobin: 14.3 g/dL (ref 12.0–15.0)
MCH: 35.9 pg — ABNORMAL HIGH (ref 26.0–34.0)
MCHC: 34 g/dL (ref 30.0–36.0)
MCV: 105.8 fL — AB (ref 78.0–100.0)
PLATELETS: 37 10*3/uL — AB (ref 150–400)
RBC: 3.98 MIL/uL (ref 3.87–5.11)
RDW: 13.3 % (ref 11.5–15.5)
WBC: 4.2 10*3/uL (ref 4.0–10.5)

## 2013-12-10 LAB — HCV RNA QUANT
HCV Quantitative Log: 6.72 {Log} — ABNORMAL HIGH (ref <1.18)
HCV Quantitative: 5197588 IU/mL — ABNORMAL HIGH (ref <15)

## 2013-12-10 LAB — GLUCOSE, CAPILLARY
GLUCOSE-CAPILLARY: 298 mg/dL — AB (ref 70–99)
GLUCOSE-CAPILLARY: 306 mg/dL — AB (ref 70–99)
GLUCOSE-CAPILLARY: 134 mg/dL — AB (ref 70–99)
Glucose-Capillary: 281 mg/dL — ABNORMAL HIGH (ref 70–99)
Glucose-Capillary: 80 mg/dL (ref 70–99)

## 2013-12-10 LAB — AFP TUMOR MARKER: AFP-Tumor Marker: 8.9 ng/mL — ABNORMAL HIGH (ref 0.0–8.0)

## 2013-12-10 SURGERY — EGD (ESOPHAGOGASTRODUODENOSCOPY)
Anesthesia: Moderate Sedation

## 2013-12-10 MED ORDER — FENTANYL CITRATE 0.05 MG/ML IJ SOLN
INTRAMUSCULAR | Status: AC
  Filled 2013-12-10: qty 4

## 2013-12-10 MED ORDER — MIDAZOLAM HCL 10 MG/2ML IJ SOLN
INTRAMUSCULAR | Status: DC | PRN
  Administered 2013-12-10: 2.5 mg via INTRAVENOUS

## 2013-12-10 MED ORDER — MIDAZOLAM HCL 10 MG/2ML IJ SOLN
INTRAMUSCULAR | Status: AC
  Filled 2013-12-10: qty 4

## 2013-12-10 MED ORDER — FENTANYL CITRATE 0.05 MG/ML IJ SOLN
INTRAMUSCULAR | Status: DC | PRN
  Administered 2013-12-10: 25 ug via INTRAVENOUS

## 2013-12-10 MED ORDER — DIPHENHYDRAMINE HCL 50 MG/ML IJ SOLN
INTRAMUSCULAR | Status: DC | PRN
  Administered 2013-12-10: 25 mg via INTRAVENOUS

## 2013-12-10 MED ORDER — DIPHENHYDRAMINE HCL 50 MG/ML IJ SOLN
INTRAMUSCULAR | Status: AC
  Filled 2013-12-10: qty 1

## 2013-12-10 MED ORDER — SODIUM CHLORIDE 0.9 % IV SOLN: INTRAVENOUS | Status: DC

## 2013-12-10 MED ORDER — BUTAMBEN-TETRACAINE-BENZOCAINE 2-2-14 % EX AERO
INHALATION_SPRAY | CUTANEOUS | Status: DC | PRN
  Administered 2013-12-10: 2 via TOPICAL

## 2013-12-10 NOTE — Progress Notes (Signed)
RUQ ultrasound does not show findings consistent with cholecystitis.  Platelet count continues to fall.  EGD noted MW tear and ulcers.  No further recs from our standpoint.  She is not a candidate for gallbladder surgery as previously mentioned.

## 2013-12-10 NOTE — Progress Notes (Signed)
Sedation  Given both pre and during procedure .Fentanyl 25 mcg given at [id number],and0948 for pain and restlessness during length of procedure.Versed 2.5 mg given at [phone] given for relaxation. Tolerated procedure fair. Patient was restless throughout procedure. Medication charted in this fashion as Dr Fuller Plan has finished his procedure note and has D,cd inter op note medication. See additional charting note from procedure note .

## 2013-12-10 NOTE — H&P (View-Only) (Signed)
Referring Provider: No ref. provider found Primary Care Physician:  Cathlean Cower, MD Primary Gastroenterologist:  ? Dr. Deatra Ina, unassigned  Reason for Consultation:  Elevated LFT's; hematemesis  HPI: [name] is a 47 y.o. female with history hepatitis C, depression, type II DM, ETOH abuse, and liver cirrhosis. Presents to the ED complaining of abdominal pain and vomiting blood.  She states that the pain started about 3 days ago and is sharp across her mid-abdomen and into her back.  She reports that she's been vomiting blood intermittently for the past year, but it started about 3 days ago and has not stopped.  No change in bowel movements reported including dark or bloody stools.  CT scan abdomen and pelvis with contrast showed the following:  IMPRESSION:  Advanced cirrhosis. Spleen is mildly enlarged. Negative for ascites.  Distended gallbladder with gallstone and mild pericholecystic fluid.  Correlate with pain in this area.  Mild dilatation of the jejunum with air-fluid levels suggesting partial obstruction of the proximal small bowel.  Ultrasound shows the following:  "IMPRESSION:  1. Cholelithiasis with borderline/mild gallbladder wall thickening.  No pericholecystic edema or tenderness with gallbladder palpation to confirm acute cholecystitis.  2. Cirrhosis with trace perihepatic ascites."  Surgery has been seeing her.  NGT was placed and there is dark red blood present in the tube and canister.  Hgb is normal at 13.8 grams.  LFT's are elevated, mostly transaminases, but are actually improved from a few months ago.  Total bili is normal at 1.0, ALP is normal at 117, AST elevated at 127, and ALT elevated at 136.  BUN is not elevated.  06/2009 normal EGD by Dr. Deatra Ina.  She has never been treated for her Hep C and has not seen anyone about it any time recently.   Past Medical History  Diagnosis Date  . Impaired glucose tolerance 11/07/2010  . Hepatitis C approx dx 2005   no tx to date  . Pancreatitis     HX of  . Alcohol dependency   . GERD (gastroesophageal reflux disease)   . Chronic constipation   . Gallstones   . Drug dependency     Hx of cocaine use  . Depression   . Rheumatoid factor positive   . Hemochromatosis     last phlebotomy tx 2-3 yrs  . Pancytopenia   . Abnormal vaginal bleeding     uterine fibroid  . Gallstone 11/09/2010  . Chronic abdominal pain 11/09/2010  . Cirrhosis 11/09/2010  . Type II or unspecified type diabetes mellitus without mention of complication, uncontrolled 09/17/2011  . Anxiety 09/17/2011    Past Surgical History  Procedure Laterality Date  . Appendectomy    . Cesarean section  x 2  . Foot surgery  2013  . Abdominal hysterectomy  12/17/2011    Procedure: HYSTERECTOMY ABDOMINAL;  Surgeon: Gus Height, MD;  Location: [facility];  Service: Gynecology;  Laterality: N/A;  . Salpingoophorectomy  12/17/2011    Procedure: SALPINGO OOPHERECTOMY;  Surgeon: Gus Height, MD;  Location: [facility];  Service: Gynecology;  Laterality: Bilateral;    Prior to Admission medications   Medication Sig Start Date End Date Taking? Authorizing Provider  chlorpheniramine (CHLOR-TRIMETON) 4 MG tablet Take 4 mg by mouth once as needed for allergies.   Yes Historical Provider, MD  esomeprazole (NEXIUM) 40 MG capsule Take 1 capsule (40 mg total) by mouth daily as needed (heart burn). 09/28/13  Yes Encarnacion Slates, NP  Ferrous Sulfate (IRON) 28 MG  TABS Take 1 tablet by mouth daily.   Yes Historical Provider, MD  glucose blood test strip 1 each by Other route 3 (three) times daily. Use as instructed   Yes Historical Provider, MD  insulin aspart (NOVOLOG) 100 UNIT/ML injection Inject 5 Units into the skin 3 (three) times daily before meals.   Yes Historical Provider, MD  insulin glargine (LANTUS) 100 UNIT/ML injection Inject 15 Units into the skin at bedtime. @@ 10 pm   Yes Historical Provider, MD  Lancets MISC 1 each by Does not apply route 3 (three) times  daily.   Yes Historical Provider, MD  linagliptin (TRADJENTA) 5 MG TABS tablet Take 1 tablet (5 mg total) by mouth daily. For diabetes 09/28/13  Yes Encarnacion Slates, NP  metFORMIN (GLUCOPHAGE) 500 MG tablet Take 500 mg by mouth 2 (two) times daily with a meal.   Yes Historical Provider, MD  Pumpkin Seed-Soy Germ (AZO BLADDER CONTROL/GO-LESS PO) Take 1 tablet by mouth once as needed (urinary tract symptoms).   Yes Historical Provider, MD  sertraline (ZOLOFT) 100 MG tablet Take 150 mg by mouth daily.   Yes Historical Provider, MD  tetrahydrozoline (VISINE) 0.05 % ophthalmic solution Place 1 drop into both eyes every morning.   Yes Historical Provider, MD  zolpidem (AMBIEN) 10 MG tablet Take 10 mg by mouth at bedtime as needed for sleep.   Yes Historical Provider, MD    Current Facility-Administered Medications  Medication Dose Route Frequency Provider Last Rate Last Dose  . dextrose 5 % and 0.45 % NaCl with KCl 20 mEq/L infusion   Intravenous Continuous Velvet Bathe, MD 75 mL/hr at 12/09/13 0820    . folic acid (FOLVITE) tablet 1 mg  1 mg Oral Daily Theodis Blaze, MD   1 mg at 12/09/13 1011  . insulin aspart (novoLOG) injection 0-15 Units  0-15 Units Subcutaneous TID WC Theodis Blaze, MD      . insulin aspart (novoLOG) injection 0-5 Units  0-5 Units Subcutaneous QHS Theodis Blaze, MD      . insulin aspart (novoLOG) injection 5 Units  5 Units Subcutaneous TID AC Theodis Blaze, MD      . insulin glargine (LANTUS) injection 15 Units  15 Units Subcutaneous QHS Theodis Blaze, MD      . linagliptin (TRADJENTA) tablet 5 mg  5 mg Oral Daily Theodis Blaze, MD      . LORazepam (ATIVAN) tablet 1 mg  1 mg Oral Q6H PRN Theodis Blaze, MD       Or  . LORazepam (ATIVAN) injection 1 mg  1 mg Intravenous Q6H PRN Theodis Blaze, MD   1 mg at 12/09/13 1017  . morphine 2 MG/ML injection 2 mg  2 mg Intravenous Q3H PRN Velvet Bathe, MD   2 mg at 12/09/13 1010  . multivitamin with minerals tablet 1 tablet  1 tablet Oral  Daily Theodis Blaze, MD   1 tablet at 12/09/13 1011  . ondansetron (ZOFRAN) tablet 4 mg  4 mg Oral Q6H PRN Velvet Bathe, MD       Or  . ondansetron (ZOFRAN) injection 4 mg  4 mg Intravenous Q6H PRN Velvet Bathe, MD      . piperacillin-tazobactam (ZOSYN) IVPB 3.375 g  3.375 g Intravenous Q8H Odis Hollingshead, MD   3.375 g at 12/09/13 0931  . sertraline (ZOLOFT) tablet 150 mg  150 mg Oral Daily Velvet Bathe, MD   150 mg at 12/09/13  1011  . thiamine (VITAMIN B-1) tablet 100 mg  100 mg Oral Daily Theodis Blaze, MD   100 mg at 12/09/13 1011   Or  . thiamine (B-1) injection 100 mg  100 mg Intravenous Daily Theodis Blaze, MD        Allergies as of 12/08/2013 - Review Complete 12/08/2013  Allergen Reaction Noted  . Iodinated diagnostic agents Hives 10/31/2010    Family History  Problem Relation Age of Onset  . Cancer Mother     ovarian  . Diabetes Father     History   Social History  . Marital Status: Married    Spouse Name: N/A    Number of Children: 3  . Years of Education: N/A   Occupational History  . formed [employer]     now staying home   Social History Main Topics  . Smoking status: Former Smoker    Types: Cigarettes    Quit date: 12/09/2010  . Smokeless tobacco: Never Used  . Alcohol Use: Yes     Comment: "drink every other day"  . Drug Use: No  . Sexual Activity: Not Currently    Birth Control/ Protection: None   Other Topics Concern  . Not on file   Social History Narrative  . No narrative on file    Review of Systems: Ten point ROS is O/W negative except as mentioned in HPI.  Physical Exam: Vital signs in last 24 hours: Temp:  [98 F (36.7 C)-99 F (37.2 C)] 98 F (36.7 C) (08/20 0550) Pulse Rate:  [90-138] 100 (08/20 0550) Resp:  [16-22] 16 (08/20 0550) BP: (93-129)/(59-90) 121/76 mmHg (08/20 0550) SpO2:  [80 %-100 %] 99 % (08/20 0550) Weight:  [122 lb (55.339 kg)] 122 lb (55.339 kg) (08/19 1909) Last BM Date: 12/08/13 General:  Alert,  Well-developed, cooperative in NAD Head:  Normocephalic and atraumatic. Eyes:  Sclera clear, no icterus.  Conjunctiva pink. Ears:  Normal auditory acuity. Nose:  NGT with dark red colored blood Mouth:  No deformity or lesions.  Lungs:  Clear throughout to auscultation.  No wheezes, crackles, or rhonchi.  Heart:  Regular rate and rhythm; no murmurs, clicks, rubs, or gallops. Abdomen:  Soft, non-distended.  BS present and somewhat tinkling.  Mild diffuse TTP without R/R/G.   Rectal:  Deferred  Msk:  Symmetrical without gross deformities. Pulses:  Normal pulses noted. Extremities:  Without clubbing or edema. Neurologic:  Alert and  oriented x4;  grossly normal neurologically. Skin:  Intact without significant lesions or rashes.  Intake/Output from previous day: 08/19 0701 - 08/20 0700 In: 675 [I.V.:675] Out: 500 [Urine:300; Emesis/NG output:200]  Lab Results:  Recent Labs  12/08/13 1535 12/08/13 2025  WBC 3.3* 3.5*  HGB 14.7 13.8  HCT 42.8 39.9  PLT 63* 55*   BMET  Recent Labs  12/08/13 1535 12/08/13 2025  NA 139 141  K 3.8 3.5*  CL 96 100  CO2 25 27  GLUCOSE 386* 248*  BUN 4* 4*  CREATININE 0.45* 0.41*  CALCIUM 9.6 9.2   LFT  Recent Labs  12/08/13 2025  PROT 7.7  ALBUMIN 3.3*  AST 127*  ALT 136*  ALKPHOS 117  BILITOT 1.0   PT/INR  Recent Labs  12/08/13 1656  LABPROT 14.6  INR 1.14   Studies/Results: Ct Abdomen Pelvis Wo Contrast  12/08/2013   CLINICAL DATA:  Abdominal pain  EXAM: CT ABDOMEN AND PELVIS WITHOUT CONTRAST  TECHNIQUE: Multidetector CT imaging of the abdomen and pelvis  was performed following the standard protocol without IV contrast.  COMPARISON:  CT abdomen pelvis 01/20/2011  FINDINGS: Progression of cirrhotic changes of the liver. The liver is enlarged with nodular contour diffusely. Gallbladder is distended and contains a small gallstone. Gallbladder wall may be mildly thickened. Spleen is mildly enlarged.  Pancreas normal. 1 mm  nonobstructing lower pole renal calculi. No renal mass or obstruction.  Proximal small bowel is mildly dilated with air-fluid levels suggesting partial proximal small bowel obstruction. Distal small bowel and colon are decompressed. Surgical clip at the base of the appendix. Negative for ascites.  IMPRESSION: Advanced cirrhosis. Spleen is mildly enlarged. Negative for ascites  Distended gallbladder with gallstone and mild pericholecystic fluid. Correlate with pain in this area  Mild dilatation of the jejunum with air-fluid levels suggesting partial obstruction of the proximal small bowel.   Electronically Signed   By: Franchot Gallo M.D.   On: 12/08/2013 16:31   US Abdomen Limited Ruq  12/09/2013   CLINICAL DATA:  Right upper quadrant pain.  Gallstone on CT.  EXAM: US ABDOMEN LIMITED - RIGHT UPPER QUADRANT  COMPARISON:  CT 1 day prior  FINDINGS: Gallbladder:  9 mm stone. Borderline/mild gallbladder wall thickening, 4 mm. Gallbladder distention, mild. Sonographic Murphy's sign was not elicited. No pericholecystic fluid.  Common bile duct:  Diameter: Normal, 5 mm.  Liver:  Cirrhosis.  Small volume perihepatic ascites.  IMPRESSION: 1. Cholelithiasis with borderline/mild gallbladder wall thickening. No pericholecystic edema or tenderness with gallbladder palpation to confirm acute cholecystitis. 2. Cirrhosis with trace perihepatic ascites.   Electronically Signed   By: Abigail Miyamoto M.D.   On: 12/09/2013 10:58    IMPRESSION:  -Elevated LFT's:  Patient has hepatitis C and cirrhosis with ETOH abuse.  LFT's are actually stable compared to 8 months ago.  Has cholelithiasis/mild gallbladder wall thickening, but no findings c/w acute cholecystitis. -Abdominal pain and nausea:  Sudden onset.  Questionable PSBO on CT scan.  NGT in place with bloody drainage, which may be NGT drama.  No history of varices on EGD in 2011 or suggestion of varices on CT scan.  -Hematemesis, intermittent for the past year but increased  recently:  No history of varices, but need to rule that out including ulcer disease, etc.  PLAN: -Will start PPI IV BID for now. -Plan for EGD on 8/21. -Trend LFT's. -? Need for HIDA scan to determine if gallbladder issue causing pain/nausea. -Will check AFP and hep C viral load.  Will need to follow-up with infectious disease or liver clinic to discuss possibility of treatment.   ZEHR, JESSICA D.  12/09/2013, 11:40 AM  Pager number [phone]      Attending physician's note   I have taken a history, examined the patient and reviewed the chart. I agree with the Advanced Practitioner's note, impression and recommendations. Back pain and diffuse abdominal pain with cholelithiasis, cirrhosis, Hep C, alcohol abuse, mildly elevated LFTs. IV PPI bid. Consider HIDA to R/O cholecystitis-defer to General Surgery. Continue NGT suction for possible partial SBO or ileus. EGD on Friday for hematemesis. The risks, benefits, and alternatives to endoscopy with possible biopsy and possible dilation were discussed with the patient and they consent to proceed.    Ladene Artist, MD [facility]

## 2013-12-10 NOTE — Interval H&P Note (Signed)
History and Physical Interval Note:  12/10/2013 9:27 AM  [name]  has presented today for surgery, with the diagnosis of Hematemesis/UGIB  The various methods of treatment have been discussed with the patient and family. After consideration of risks, benefits and other options for treatment, the patient has consented to  Procedure(s): ESOPHAGOGASTRODUODENOSCOPY (EGD) (N/A) as a surgical intervention .  The patient's history has been reviewed, patient examined, no change in status, stable for surgery.  I have reviewed the patient's chart and labs.  Questions were answered to the patient's satisfaction.     Pricilla Riffle. Fuller Plan MD

## 2013-12-10 NOTE — Progress Notes (Signed)
Patient ID: [name], female   DOB: [birth year], 48 y.o.   MRN: [id number] [facility] PROGRESS NOTE  [name] QQP:[id number] DOB: February 19, 1966 DOA: 12/08/2013 PCP: Cathlean Cower, MD  Brief narrative: 48 y.o. female with past medical history of hepatitis C, liver cirrhosis, depression, DM type II who presented to [facility] ED 12/08/2013 with right upper quadrant abdominal pain, sharp, intermittent, 10/10 in intensity when the pain is present. Pain is assocaited with nausea and poor oral intake. No fevers or chills. No diarrhea. Work up in ED included CT abdomen which showed findings suspicious for possible cholecystitis and partial small bowel obstruction. In addition, her alcohol level was 172 on this admission. AST was 149, ALT was 149, ALP 127, normal bilirubin and normal lipase level.   Assessment and Plan:   Principal Problem:  Abdominal pain, right upper quadrant in the setting of partial small bowel obstruction  Partial small bowel obstruction seen on CT abdomen. In addition, CT abdomen revealed distended gallbladder with gallstone and mild pericholecystic fluid. Abdominal US done 12/09/2013 - findings of cholelithiasis with borderline gallbladder wall thickening but no evidence of acute cholecystis. Pt was started on empiric zosyn for possible cholecystitis. Will see with GI if this can be stopped since no evidence of cholecystitis on US abdomen. No fevers. Normal WBC count. Trend LFT's. AST 149 --> 127 --> 75; ALT 149 --> 127 --> 69; ALP 127 --> 117; normal lipase. Initial bilirubin was normal but this am slightly high at 1.3. Will monitor. Continue conservative management with NG tube suction, IV fluids, analgesia as needed and antiemetics as needed. Appreciate surgery following.   Active Problems:  Hematemesis  Blood seen in NGT. GI plans to do EGD today 12/10/2013  for evaluation of hematemesis.  Continue protonix 40 mg IV Q 12 hours. Continue antiemetics as needed. Acute alcohol  intoxication  CI[facility] protocol order in place. Alcohol level 172 on this admission  No reports of withdrawals.  Continue IV fluids, MVI, thiamine, folic acid per CIWA protocol.  Liver cirrhosis in the setting of hepatitis C and alcohol induced hepatitis  History of alcohol and substance abuse.  Alcohol 172 on this admission. CIWA protocol order in place. LFT's improving with the exception of bilirubin which was WNL on admission and now 1.3 Abnormal LFT's  Secondary to history of alcohol abuse. Improving.   AFP tumor marker obtained and high at 8.9. No evidence of malignancy on CT abdomen.  Hypokalemia  Secondary to GI losses.  Repleted and WNL this am. Thrombocytopenia  Likely bone marrow suppression from alcohol abuse. Platelet count trending down, 55 --> 37.  Continue to monitor CBC. May require FFP transfusion or platelets if going for EGD. Will check with GI. Diabetes mellitus  A1c is 9.5 on this admission indicating poor glycemic control.  Continue Lantus 15 units daily and novolog 5 units TIDAC and SSI. Continue tradjenta. MDD (major depressive disorder)  Continue sertraline  DVT prophylaxis  SCD's while pt is in hospital.  History of substance abuse  USD positive for opiates. SW consulted for substance abuse to provide resources    Code Status: Full  Family Communication: Updated the patient at the bedside   Disposition Plan: Home when medically stable    IV Access:   Peripheral IV - right antecubital fossa Procedures and diagnostic studies:   Ct Abdomen Pelvis Wo Contrast 12/08/2013 Advanced cirrhosis. Spleen is mildly enlarged. Negative for ascites Distended gallbladder with gallstone and mild pericholecystic fluid. Correlate with pain in  this area Mild dilatation of the jejunum with air-fluid levels suggesting partial obstruction of the proximal small bowel.  Abdominal ultrasound 12/09/2013 Cholelithiasis with borderline/mild gallbladder wall thickening. No  pericholecystic edema or tenderness with gallbladder palpation to confirm acute cholecystitis. 2. Cirrhosis with trace perihepatic ascites.  Medical Consultants:   Surgery  GI (Dr. Kennedy Bucker)  Other Consultants:   Physical therapy  Social work for substance abuse  Anti-Infectives:   Zosyn 12/08/2013 -->   Faye Ramsay, MD  [facility] Pager [phone]  If 7PM-7AM, please contact night-coverage [email or web address] Password [facility] 12/10/2013, 8:58 AM   LOS: 2 days   HPI/Subjective: No events overnight.   Objective: Filed Vitals:   12/10/13 0030 12/10/13 0542 12/10/13 0844 12/10/13 0852  BP: 100/62 100/83 128/90   Pulse: 107 98    Temp: 98.7 F (37.1 C) 98.3 F (36.8 C) 98.8 F (37.1 C)   TempSrc: Oral Oral Oral   Resp: 14 16 6    Height:      Weight:      SpO2: 94% 97% 88% 96%    Intake/Output Summary (Last 24 hours) at 12/10/13 0858 Last data filed at 12/10/13 0738  Gross per 24 hour  Intake 1927.5 ml  Output   1100 ml  Net  827.5 ml    Exam:   General:  Pt is alert, follows commands appropriately, not in acute distress  Cardiovascular: Regular rate and rhythm, S1/S2, no murmurs, no rubs, no gallops  Respiratory: Clear to auscultation bilaterally, no wheezing, no crackles, no rhonchi  Abdomen: Soft, non distended, bowel sounds present, no guarding; NG tube in place  Extremities: No edema, pulses DP and PT palpable bilaterally  Neuro: Grossly nonfocal  Data Reviewed: Basic Metabolic Panel:  Recent Labs Lab 12/08/13 1535 12/08/13 2025 12/10/13 0505  NA 139 141 137  K 3.8 3.5* 4.2  CL 96 100 97  CO2 25 27 32  GLUCOSE 386* 248* 288*  BUN 4* 4* 7  CREATININE 0.45* 0.41* 0.53  CALCIUM 9.6 9.2 9.2   Liver Function Tests:  Recent Labs Lab 12/08/13 1535 12/08/13 2025 12/10/13 0505  AST 149* 127* 75*  ALT 149* 136* 96*  ALKPHOS 127* 117 101  BILITOT 1.1 1.0 1.3*  PROT 8.5* 7.7 7.1  ALBUMIN 3.7 3.3* 2.9*    Recent Labs Lab 12/08/13 1535   LIPASE 55   No results found for this basename: AMMONIA,  in the last 168 hours CBC:  Recent Labs Lab 12/08/13 1535 12/08/13 2025 12/10/13 0505  WBC 3.3* 3.5* 4.2  NEUTROABS 1.8  --   --   HGB 14.7 13.8 14.3  HCT 42.8 39.9 42.1  MCV 102.1* 100.8* 105.8*  PLT 63* 55* 37*   Cardiac Enzymes: No results found for this basename: CKTOTAL, CKMB, CKMBINDEX, TROPONINI,  in the last 168 hours BNP: No components found with this basename: POCBNP,  CBG:  Recent Labs Lab 12/09/13 1255 12/09/13 1657 12/09/13 2120 12/10/13 0455 12/10/13 0813  GLUCAP 229* 179* 92 298* 306*    No results found for this or any previous visit (from the past 240 hour(s)).   Scheduled Meds: . folic acid  1 mg Oral Daily  . insulin aspart  0-15 Units Subcutaneous TID WC  . insulin aspart  0-5 Units Subcutaneous QHS  . insulin aspart  5 Units Subcutaneous TID AC  . insulin glargine  15 Units Subcutaneous QHS  . linagliptin  5 mg Oral Daily  . multivitamin   1 tablet Oral Daily  .  pantoprazole   40 mg Intravenous Q12H  . piperacillin-tazobactam   3.375 g Intravenous Q8H  . sertraline  150 mg Oral Daily  . thiamine  100 mg Oral Daily   Or  . thiamine  100 mg Intravenous Daily   Continuous Infusions: . sodium chloride    . dextrose 5 % and 0.45 % NaCl with KCl 20 mEq/L 75 mL/hr (12/10/13 0100)

## 2013-12-10 NOTE — Op Note (Signed)
[facility] [place], [id number]   ENDOSCOPY PROCEDURE REPORT  PATIENT: Meredith, [name]  MR#: [id number] BIRTHDATE: 04/12/1966 , 48  yrs. old GENDER: Female ENDOSCOPIST: Ladene Artist, MD, [facility] REFERRED BY:  [facility] PROCEDURE DATE:  12/10/2013 PROCEDURE:  EGD, diagnostic ASA CLASS:     Class III INDICATIONS:  Hematemesis. MEDICATIONS: These medications were titrated to patient response per physician's verbal order, Fentanyl 100 mcg IV, Versed 10 mg IV, and Diphenhydramine (Benadryl) 25 mg IV TOPICAL ANESTHETIC: Cetacaine Spray DESCRIPTION OF PROCEDURE: After the risks benefits and alternatives of the procedure were thoroughly explained, informed consent was obtained.  The Pentax Gastroscope [id number] endoscope was introduced through the mouth and advanced to the second portion of the duodenum. Without limitations.  The instrument was slowly withdrawn as the mucosa was fully examined.  ESOPHAGUS: A Mallory-Weiss tear with evidence of recent bleeding was found at the gastroesophageal junction. It extended from the distal esophagus into the gastric cardia.  The esophagus was otherwise normal.   No varices noted. STOMACH: Three linear, medium sized superficial non-bleeding ulcers with red spots were found in greater curvature the gastric body which could be secondary to the NG tube.  Diffuse non erosive gastritis vs gastropathy. No biopsies obtained due to low platelets. The stomach otherwise appeared normal. DUODENUM: The duodenal mucosa showed no abnormalities in the bulb and second portion of the duodenum.  Retroflexed views revealed a Mallory-Weiss tear with evidence of recent bleeding. No gastric varices noted. The scope was then withdrawn from the patient and the procedure completed.  COMPLICATIONS: There were no complications.  ENDOSCOPIC IMPRESSION: 1.   Mallory-Weiss tear at the gastroesophageal junction 2.  Three linear,  medium sized, non-bleeding ulcers in the gastric body 3.   Diffuse gastritis vs gastropathy  RECOMMENDATIONS: 1.  Avoid NSAIDS 2.  Continue PPI  eSigned:  Ladene Artist, MD, [facility] 12/10/2013 9:59 AM

## 2013-12-11 ENCOUNTER — Inpatient Hospital Stay (HOSPITAL_COMMUNITY): Payer: BC Managed Care – PPO

## 2013-12-11 LAB — GLUCOSE, CAPILLARY
GLUCOSE-CAPILLARY: 90 mg/dL (ref 70–99)
Glucose-Capillary: 172 mg/dL — ABNORMAL HIGH (ref 70–99)
Glucose-Capillary: 123 mg/dL — ABNORMAL HIGH (ref 70–99)
Glucose-Capillary: 175 mg/dL — ABNORMAL HIGH (ref 70–99)

## 2013-12-11 MED ORDER — HYDROMORPHONE HCL PF 1 MG/ML IJ SOLN
1.0000 mg | INTRAMUSCULAR | Status: DC | PRN
  Administered 2013-12-11 – 2013-12-12 (×8): 1 mg via INTRAVENOUS
  Filled 2013-12-11 (×8): qty 1

## 2013-12-11 NOTE — Progress Notes (Signed)
[facility] Progress Note  Subjective:  Still having diffuse abdominal pain and pain into her back but it has improved.  NGT still in place. No BMs or flatus.   Objective:  Vital signs in last 24 hours: Temp:  [98.5 F (36.9 C)-99.5 F (37.5 C)] 98.7 F (37.1 C) (08/22 0544) Pulse Rate:  [89-146] 89 (08/22 0544) Resp:  [9-18] 16 (08/22 0544) BP: (101-144)/(64-117) 106/72 mmHg (08/22 0544) SpO2:  [78 %-100 %] 95 % (08/22 0544) Last BM Date: 12/08/13  General:  Alert, Well-developed, in NAD Heart:  Regular rate and rhythm; no murmurs Pulm:  CTAB.  No W/R/R. Abdomen:  Soft, non-distended. Hypoactive BS.  Mild diffuse TTP. Extremities:  Without edema. Neurologic:  Alert and  oriented x4;  grossly normal neurologically. Psych:  Alert and cooperative. Normal mood and affect.  Intake/Output from previous day: 08/21 0701 - 08/22 0700 In: 1572.5 [I.V.:1522.5; IV Piggyback:50] Out: 1400 [Urine:800; Emesis/NG output:600]  Lab Results:  Recent Labs  12/08/13 1535 12/08/13 2025 12/10/13 0505  WBC 3.3* 3.5* 4.2  HGB 14.7 13.8 14.3  HCT 42.8 39.9 42.1  PLT 63* 55* 37*   BMET  Recent Labs  12/08/13 1535 12/08/13 2025 12/10/13 0505  NA 139 141 137  K 3.8 3.5* 4.2  CL 96 100 97  CO2 25 27 32  GLUCOSE 386* 248* 288*  BUN 4* 4* 7  CREATININE 0.45* 0.41* 0.53  CALCIUM 9.6 9.2 9.2   LFT  Recent Labs  12/10/13 0505  PROT 7.1  ALBUMIN 2.9*  AST 75*  ALT 96*  ALKPHOS 101  BILITOT 1.3*   PT/INR  Recent Labs  12/08/13 1656  LABPROT 14.6  INR 1.14   US Abdomen Limited Ruq  12/09/2013   CLINICAL DATA:  Right upper quadrant pain.  Gallstone on CT.  EXAM: US ABDOMEN LIMITED - RIGHT UPPER QUADRANT  COMPARISON:  CT 1 day prior  FINDINGS: Gallbladder:  9 mm stone. Borderline/mild gallbladder wall thickening, 4 mm. Gallbladder distention, mild. Sonographic Murphy's sign was not elicited. No pericholecystic fluid.  Common bile duct:  Diameter: Normal, 5  mm.  Liver:  Cirrhosis.  Small volume perihepatic ascites.  IMPRESSION: 1. Cholelithiasis with borderline/mild gallbladder wall thickening. No pericholecystic edema or tenderness with gallbladder palpation to confirm acute cholecystitis. 2. Cirrhosis with trace perihepatic ascites.   Electronically Signed   By: Abigail Miyamoto M.D.   On: 12/09/2013 10:58    Assessment / Plan: -Elevated LFT's: Patient has hepatitis C and cirrhosis with ETOH abuse. LFT's are actually stable and actually improving. Has cholelithiasis/mild gallbladder wall thickening, but no findings c/w acute cholecystitis.  Surgery signed off.  -Gastric ulcers and MWT:  Seen on EGD 8/21.   -Abdominal pain and nausea:  Questionable PSBO or ileus on CT scan.  Had gastric ulcers and MWT on EGD as well. -Hematemesis:  Likely secondary to MWT.  Also had gastric ulcers on EGD.  -Continue PPI IV BID for now.  -Avoid NSAID's. -Trend LFT's and monitor Hgb. -Will check abdominal x-ray today. -Will need to follow-up with infectious disease or liver clinic to discuss possibility of treatment of Hep C.   LOS: 3 days   ZEHR, JESSICA D.  12/11/2013, 8:49 AM  Pager number [phone]     Attending physician's note   I have taken an interval history, reviewed the chart and examined the patient. I agree with the Advanced Practitioner's note, impression and recommendations. Abd pain improved but persists. No BM or flatus.  Awaiting repeat abdominal films, if improved can clamp NGT. If PSBO persists will need to re consult General Surgery. Mildly LFTs are stable, from Hep C and alcohol. No recurrent GI bleeding.   Pricilla Riffle. Fuller Plan, MD [facility]

## 2013-12-11 NOTE — Progress Notes (Addendum)
Patient ID: [name], female   DOB: June 27, 1965, 48 y.o.   MRN: [id number]  [facility] PROGRESS NOTE  Cecilie Heidel Wilhite NKN:[id number] DOB: [birth year] DOA: 12/08/2013 PCP: Cathlean Cower, MD  Brief narrative:  48 y.o. female with past medical history of hepatitis C, liver cirrhosis, depression, DM type II who presented to [facility] ED 12/08/2013 with right upper quadrant abdominal pain, sharp, intermittent, 10/10 in intensity when the pain is present. Pain is assocaited with nausea and poor oral intake. No fevers or chills. No diarrhea. Work up in ED included CT abdomen which showed findings suspicious for possible cholecystitis and partial small bowel obstruction. In addition, her alcohol level was 172 on this admission. AST was 149, ALT was 149, ALP 127, normal bilirubin and normal lipase level.   Assessment and Plan:   Principal Problem:  Abdominal pain, right upper quadrant in the setting of partial small bowel obstruction  Partial small bowel obstruction seen on CT abdomen. In addition, CT abdomen revealed distended gallbladder with gallstone and mild pericholecystic fluid. Abdominal US done 12/09/2013 - findings of cholelithiasis with borderline gallbladder wall thickening but no evidence of acute cholecystis. Pt was started on empiric zosyn for possible cholecystitis. Will see with GI if this can be stopped since no evidence of cholecystitis on US abdomen.  No fevers. Normal WBC count. Trend LFT's. AST 149 --> 127 --> 75; ALT 149 --> 127 --> 69; ALP 127 --> 117; normal lipase. EGD done 8/21 --> gastric ulcers and MWT, abd XRAY today  Continue PPI IV BID for now. Active Problems:  Hematemesis  Blood seen in NGT. EGD with MWT Continue protonix 40 mg IV Q 12 hours.  Continue antiemetics as needed. Acute alcohol intoxication  CIWA protocol order in place. Alcohol level 172 on this admission  No reports of withdrawals.  Continue IV fluids, MVI, thiamine, folic acid per CIWA protocol.  Liver  cirrhosis in the setting of hepatitis C and alcohol induced hepatitis  History of alcohol and substance abuse.  Alcohol 172 on this admission. CIWA protocol order in place.  LFT's improving  Abnormal LFT's   Secondary to history of alcohol abuse. Improving.   AFP tumor marker obtained and high at 8.9. No evidence of malignancy on CT abdomen.  Hypokalemia   Secondary to GI losses.   Repleted and WNL this am. Thrombocytopenia   Likely bone marrow suppression from alcohol abuse. Platelet count trending down, 55 --> 37.   Repeat CBC in AM Diabetes mellitus  A1c is 9.5 on this admission indicating poor glycemic control.  Continue Lantus 15 units daily and novolog 5 units TIDAC and SSI.  Continue tradjenta. MDD (major depressive disorder)  Continue sertraline  DVT prophylaxis  SCD's while pt is in hospital.  History of substance abuse  UDS positive for opiates.  SW consulted for substance abuse to provide resources  Moderate PCM  Secondary to acute illness imposed on chronic alcohol abuse and poor oral intake   - NGT now in place so pt is NPO  Code Status: Full  Family Communication: Updated the patient at the bedside  Disposition Plan: Home when medically stable   IV Access:   Peripheral IV - right antecubital fossa Procedures and diagnostic studies:   Ct Abdomen Pelvis Wo Contrast 12/08/2013 Advanced cirrhosis. Spleen is mildly enlarged. Negative for ascites Distended gallbladder with gallstone and mild pericholecystic fluid. Correlate with pain in this area Mild dilatation of the jejunum with air-fluid levels suggesting partial obstruction of the proximal  small bowel.  Abdominal ultrasound 12/09/2013 Cholelithiasis with borderline/mild gallbladder wall thickening. No pericholecystic edema or tenderness with gallbladder palpation to confirm acute cholecystitis. 2. Cirrhosis with trace perihepatic ascites.  EGD 8/21 --> gastric ulcers and MWT  Medical Consultants:   Surgery   GI (Dr. Kennedy Bucker)   Other Consultants:   Physical therapy  Social work for substance abuse   Anti-Infectives:   Zosyn 12/08/2013 -->  Faye Ramsay, MD  [facility] Pager [phone]  If 7PM-7AM, please contact night-coverage [email or web address] Password [facility] 12/11/2013, 10:54 AM   LOS: 3 days   HPI/Subjective: No events overnight.   Objective: Filed Vitals:   12/10/13 1336 12/10/13 2200 12/11/13 0200 12/11/13 0544  BP: 103/71 107/76 101/64 106/72  Pulse: 96 96 96 89  Temp: 99.5 F (37.5 C) 99.2 F (37.3 C) 98.9 F (37.2 C) 98.7 F (37.1 C)  TempSrc: Oral Oral Oral Oral  Resp: 14 16 16 16   Height:      Weight:      SpO2: 98% 94% 94% 95%    Intake/Output Summary (Last 24 hours) at 12/11/13 1054 Last data filed at 12/11/13 1029  Gross per 24 hour  Intake 1572.5 ml  Output   1400 ml  Net  172.5 ml    Exam:   General:  Pt is alert, follows commands appropriately, not in acute distress  Cardiovascular: Regular rate and rhythm, S1/S2, no murmurs, no rubs, no gallops  Respiratory: Clear to auscultation bilaterally, no wheezing, no crackles, no rhonchi  Abdomen: Soft, slightly tender in epigastric area with no guarding   Extremities: No edema, pulses DP and PT palpable bilaterally  Neuro: Grossly nonfocal  Data Reviewed: Basic Metabolic Panel:  Recent Labs Lab 12/08/13 1535 12/08/13 2025 12/10/13 0505  NA 139 141 137  K 3.8 3.5* 4.2  CL 96 100 97  CO2 25 27 32  GLUCOSE 386* 248* 288*  BUN 4* 4* 7  CREATININE 0.45* 0.41* 0.53  CALCIUM 9.6 9.2 9.2   Liver Function Tests:  Recent Labs Lab 12/08/13 1535 12/08/13 2025 12/10/13 0505  AST 149* 127* 75*  ALT 149* 136* 96*  ALKPHOS 127* 117 101  BILITOT 1.1 1.0 1.3*  PROT 8.5* 7.7 7.1  ALBUMIN 3.7 3.3* 2.9*    Recent Labs Lab 12/08/13 1535  LIPASE 55   CBC:  Recent Labs Lab 12/08/13 1535 12/08/13 2025 12/10/13 0505  WBC 3.3* 3.5* 4.2  NEUTROABS 1.8  --   --   HGB 14.7 13.8 14.3   HCT 42.8 39.9 42.1  MCV 102.1* 100.8* 105.8*  PLT 63* 55* 37*   CBG:  Recent Labs Lab 12/10/13 0813 12/10/13 1219 12/10/13 1742 12/10/13 2000 12/11/13 0753  GLUCAP 306* 281* 80 134* 172*   Scheduled Meds: . folic acid  1 mg Oral Daily  . insulin aspart  0-15 Units Subcutaneous TID WC  . insulin aspart  0-5 Units Subcutaneous QHS  . insulin aspart  5 Units Subcutaneous TID AC  . insulin glargine  15 Units Subcutaneous QHS  . linagliptin  5 mg Oral Daily  . multivitamin with minerals  1 tablet Oral Daily  . pantoprazole (PROTONIX) IV  40 mg Intravenous Q12H  . piperacillin-tazobactam (ZOSYN)  IV  3.375 g Intravenous Q8H  . sertraline  150 mg Oral Daily  . thiamine  100 mg Oral Daily   Or  . thiamine  100 mg Intravenous Daily   Continuous Infusions: . sodium chloride Stopped (12/10/13 0845)  . dextrose 5 % and  0.45 % NaCl with KCl 20 mEq/L 75 mL/hr at 12/11/13 0243

## 2013-12-11 NOTE — Progress Notes (Signed)
ANTIBIOTIC CONSULT NOTE - Follow up  Pharmacy Consult for Zosyn Indication: r/o acute cholecystitis  Allergies  Allergen Reactions  . Iodinated Diagnostic Agents Hives    Pt states allergic to Betadine.    Patient Measurements: Height: 5\' 2"  (157.5 cm) Weight: 122 lb (55.339 kg) IBW/kg (Calculated) : 50.1   Vital Signs: Temp: 98.7 F (37.1 C) (08/22 0544) Temp src: Oral (08/22 0544) BP: 106/72 mmHg (08/22 0544) Pulse Rate: 89 (08/22 0544) Intake/Output from previous day: 08/21 0701 - 08/22 0700 In: 1572.5 [I.V.:1522.5; IV Piggyback:50] Out: 1400 [Urine:800; Emesis/NG output:600] Intake/Output from this shift:    Labs:  Recent Labs  12/08/13 1535 12/08/13 2025 12/10/13 0505  WBC 3.3* 3.5* 4.2  HGB 14.7 13.8 14.3  PLT 63* 55* 37*  CREATININE 0.45* 0.41* 0.53   Estimated Creatinine Clearance: 68 ml/min (by C-G formula based on Cr of 0.53).    Microbiology: No results found for this or any previous visit (from the past 720 hour(s)).  Assessment: 48yo F w/ possible acute cholecystitis.  8/19 >> Zosyn >>  Tmax: Afeb WBCs: wnl Renal: SCr low, CrCl 68 CG  No culture data  8/22: D4 Zosyn EI. Ultrasound neg for cholecystitis. EGD showed Mallory Weiss tear and PUD.  Goal of Therapy:  Appropriate antibiotic dosing for renal function; eradication of infection.  Plan:   Cont Zosyn 3.375 grams IV q8h (extended-infusion, each dose over 4 hours).  Dose-adjustments unlikely to be necessary.  Consider specifying duration of therapy.  Pharmacy will sign-off.  Romeo Rabon, PharmD, pager [phone]. 12/11/2013,8:40 AM.

## 2013-12-12 DIAGNOSIS — F1010 Alcohol abuse, uncomplicated: Secondary | ICD-10-CM

## 2013-12-12 LAB — BASIC METABOLIC PANEL
ANION GAP: 7 (ref 5–15)
BUN: 4 mg/dL — ABNORMAL LOW (ref 6–23)
CALCIUM: 9.3 mg/dL (ref 8.4–10.5)
CO2: 33 mEq/L — ABNORMAL HIGH (ref 19–32)
Chloride: 101 mEq/L (ref 96–112)
Creatinine, Ser: 0.53 mg/dL (ref 0.50–1.10)
GFR calc Af Amer: >90 mL/min (ref >90)
Glucose, Bld: 195 mg/dL — ABNORMAL HIGH (ref 70–99)
Potassium: 3.9 mEq/L (ref 3.7–5.3)
Sodium: 141 mEq/L (ref 137–147)

## 2013-12-12 LAB — GLUCOSE, CAPILLARY
GLUCOSE-CAPILLARY: 66 mg/dL — AB (ref 70–99)
Glucose-Capillary: 228 mg/dL — ABNORMAL HIGH (ref 70–99)
Glucose-Capillary: 108 mg/dL — ABNORMAL HIGH (ref 70–99)
Glucose-Capillary: 264 mg/dL — ABNORMAL HIGH (ref 70–99)
Glucose-Capillary: 53 mg/dL — ABNORMAL LOW (ref 70–99)
Glucose-Capillary: 56 mg/dL — ABNORMAL LOW (ref 70–99)
Glucose-Capillary: 105 mg/dL — ABNORMAL HIGH (ref 70–99)

## 2013-12-12 LAB — CBC
HCT: 41.4 % (ref 36.0–46.0)
Hemoglobin: 14.1 g/dL (ref 12.0–15.0)
MCH: 35.2 pg — AB (ref 26.0–34.0)
MCHC: 34.1 g/dL (ref 30.0–36.0)
MCV: 103.2 fL — ABNORMAL HIGH (ref 78.0–100.0)
PLATELETS: 53 10*3/uL — AB (ref 150–400)
RBC: 4.01 MIL/uL (ref 3.87–5.11)
RDW: 12.9 % (ref 11.5–15.5)
WBC: 2.2 10*3/uL — ABNORMAL LOW (ref 4.0–10.5)

## 2013-12-12 MED ORDER — INSULIN GLARGINE 100 UNIT/ML ~~LOC~~ SOLN
10.0000 [IU] | Freq: Every day | SUBCUTANEOUS | Status: DC
  Administered 2013-12-12: 10 [IU] via SUBCUTANEOUS
  Filled 2013-12-12: qty 0.1

## 2013-12-12 MED ORDER — GLUCOSE 40 % PO GEL: 1.0000 | ORAL | Status: DC | PRN

## 2013-12-12 MED ORDER — INSULIN GLARGINE 100 UNIT/ML ~~LOC~~ SOLN
10.0000 [IU] | Freq: Every day | SUBCUTANEOUS | Status: DC
  Filled 2013-12-12: qty 0.1

## 2013-12-12 MED ORDER — GLUCOSE 40 % PO GEL
ORAL | Status: AC
  Administered 2013-12-12: 23:00:00
  Filled 2013-12-12: qty 1

## 2013-12-12 MED ORDER — HYDROMORPHONE HCL PF 2 MG/ML IJ SOLN
2.0000 mg | INTRAMUSCULAR | Status: DC | PRN
  Administered 2013-12-12 – 2013-12-13 (×9): 2 mg via INTRAVENOUS
  Filled 2013-12-12 (×9): qty 1

## 2013-12-12 NOTE — Progress Notes (Signed)
[facility] Progress Note  Subjective:  Complaining mostly of back pain but still as some abdominal pain as well.  NGT clamped since yesterday afternoon with no nausea or vomiting; sipped on a ginger ale without issues.  NGT is uncomfortable for her.  Objective:  Vital signs in last 24 hours: Temp:  [98.5 F (36.9 C)-99.3 F (37.4 C)] 98.5 F (36.9 C) (08/23 0558) Pulse Rate:  [71-86] 86 (08/23 0558) Resp:  [16-18] 18 (08/23 0558) BP: (110-112)/(66-85) 112/78 mmHg (08/23 0558) SpO2:  [95 %-98 %] 98 % (08/23 0558) Last BM Date: 12/08/13 General:  Alert, Well-developed, chronically ill appearing in NAD Heart:  Regular rate and rhythm; no murmurs Pulm:  CTAB.  No W/R/R. Abdomen:  Soft, non-distended.  BS present.  Mild diffuse TTP without R/R/G.   Extremities:  Without edema. Neurologic:  Alert and  oriented x4;  grossly normal neurologically.  Somewhat tremulous.  Intake/Output from previous day: 08/22 0701 - 08/23 0700 In: 600 [I.V.:600] Out: 1150 [Urine:900; Emesis/NG output:250]  Lab Results:  Recent Labs  12/10/13 0505 12/12/13 0627  WBC 4.2 2.2*  HGB 14.3 14.1  HCT 42.1 41.4  PLT 37* 53*   BMET  Recent Labs  12/10/13 0505 12/12/13 0627  NA 137 141  K 4.2 3.9  CL 97 101  CO2 32 33*  GLUCOSE 288* 195*  BUN 7 4*  CREATININE 0.53 0.53  CALCIUM 9.2 9.3   LFT  Recent Labs  12/10/13 0505  PROT 7.1  ALBUMIN 2.9*  AST 75*  ALT 96*  ALKPHOS 101  BILITOT 1.3*   Dg Abd 2 Views  12/11/2013   CLINICAL DATA:  History of partial small bowel obstruction now for re-evaluation; nasogastric tube placed 3 days ago.  EXAM: ABDOMEN - 2 VIEW  COMPARISON:  CT scan of the abdomen and pelvis dated August 08, 2013  FINDINGS: The bowel gas pattern is normal. There is no evidence of a small or large bowel obstruction. The nasogastric tube tip lies in the region of the distal stomach. There are phleboliths in the pelvis. The bony structures are unremarkable.   IMPRESSION: There is no evidence of a small or large bowel obstruction now.   Electronically Signed   By: David  Martinique   On: 12/11/2013 15:54    Assessment / Plan: -Elevated LFT's: Patient has hepatitis C and cirrhosis with ETOH abuse. LFT's are actually stable and actually improving. Has cholelithiasis/mild gallbladder wall thickening, but no findings c/w acute cholecystitis. Surgery signed off.  -Gastric ulcers and MWT: Seen on EGD 8/21.  -Abdominal pain and nausea: Questionable PSBO or ileus on CT scan. Abdominal x-ray showed no persistent obstruction.  Tolerated clamped NGT since yesterday.  Had gastric ulcers and MWT on EGD as well.  -Hematemesis: Likely secondary to MWT. Also had gastric ulcers on EGD.   -Continue PPI IV BID for now.  -Avoid NSAID's.  -Trend LFT's and monitor Hgb.  -Will removed NGT and try clear liquids. -Will need to follow-up with infectious disease or liver clinic to discuss possibility of treatment of Hep C.   LOS: 4 days   ZEHR, JESSICA D.  12/12/2013, 8:43 AM  Pager number [phone]     Attending physician's note   I have taken an interval history, reviewed the chart and examined the patient. I agree with the Advanced Practitioner's note, impression and recommendations. She is markedly improved, NG is out and she is taking clear liquids.   * Gastric ulcers and MW  tear. Avoid NSAIDs. Avoid ETOH. PPI BID for 2 weeks and then PPI qam indefinitely.  * Elevated LFTs secondary to etoh hepatitis and Hep C. Avoid ETOH and follow up with ID clinic or [facility] clinic for consideration of Hep C treatment. ETOH rehab program.   * Cholelithiasis with mild gallbladder wall thickening. Possible cholecystitis. Gen Surgery consulted and has signed off. They recommended IV antibiotics. Consider change to PO antibiotics tomorrow to complete a total of 7-10 day course. *PSBO vs ileus has resolved. Advance diet later today and tomorrow. GI signing off.    Pricilla Riffle.  Fuller Plan, MD [facility]

## 2013-12-12 NOTE — Progress Notes (Addendum)
Patient ID: [name], female   DOB: Sep 03, 1965, 48 y.o.   MRN: [id number] [facility] PROGRESS NOTE  [name] PXT:[id number] DOB: [birth year] DOA: 12/08/2013 PCP: Cathlean Cower, MD  Brief narrative: 48 y.o. female with past medical history of hepatitis C, liver cirrhosis, depression, DM type II who presented to [facility] ED 12/08/2013 with right upper quadrant abdominal pain, sharp, intermittent, 10/10 in intensity when the pain is present. Pain is assocaited with nausea and poor oral intake. No fevers or chills. No diarrhea. Work up in ED included CT abdomen which showed findings suspicious for possible cholecystitis and partial small bowel obstruction. In addition, her alcohol level was 172 on this admission. AST was 149, ALT was 149, ALP 127, normal bilirubin and normal lipase level. Patient underwent EGD 12/10/2013 with evidence of gastric ulcers and MWT. Most recent abdominal x ray did not reveal SBO or large bowel obstruction.  Assessment and Plan:   Principal Problem:  Abdominal pain, right upper quadrant in the setting of partial small bowel obstruction / Probable acute cholecystitis  Partial small bowel obstruction seen on CT abdomen. In addition, CT abdomen revealed distended gallbladder with gallstone and mild pericholecystic fluid. Abdominal US done 12/09/2013 - findings of cholelithiasis with borderline gallbladder wall thickening but no evidence of acute cholecystis. Pt was started on empiric zosyn for possible cholecystitis. Will change to PO regimen prior to discharge for about 7-10 days. Based on repeat imaging studies, no evidence of sball bowel or large bowel obstruction (abdominal X ray 12/11/2013). No fevers. Normal WBC count. Trend LFT's. AST 149 --> 127 --> 75; ALT 149 --> 127 --> 69; ALP 127 --> 117; normal lipase.  Continue PPI  BID for now. Active Problems:  Hematemesis /Gasrtic ulcers / MWT Blood seen in NGT. EGD with MWT and gastric ulcers. Per GI, avoid NSAID's, EtOH.  Needs to take PPI BID for 2 weeks and the PPI daily indefinitely. Continue protonix 40 mg  Q 12 hours.  Continue antiemetics as needed. Diet advanced to clears and then as tolerated. Acute alcohol intoxication  CIWA protocol order in place. Alcohol level 172 on this admission  No reports of withdrawals.  Continue thiamine, folic acid and multivitamin.  Liver cirrhosis in the setting of hepatitis C and alcohol induced hepatitis History of alcohol and substance abuse.  Alcohol 172 on this admission. CIWA protocol order in place.  LFT's improving  Abnormal LFT's   Secondary to history of alcohol abuse. Improving.   AFP tumor marker obtained and high at 8.9. No evidence of malignancy on CT abdomen.  Hypokalemia   Secondary to GI losses.   Repleted and WNL this am. Thrombocytopenia   Likely bone marrow suppression from alcohol abuse. Platelet count stabilizing, trended down to 37 but back up at 53. Diabetes mellitus  A1c is 9.5 on this admission indicating poor glycemic control.  Continue Lantus 15 units daily and novolog 5 units TIDAC and SSI.  Continue tradjenta. MDD (major depressive disorder)  Continue sertraline  DVT prophylaxis   SCD's while pt is in hospital due to risk of bleed History of substance abuse  UDS positive for opiates.  SW consulted for substance abuse to provide resources  Moderate protein calorie malnutrition  Secondary to acute illness imposed on chronic alcohol abuse and poor oral intake   Code Status: Full  Family Communication: Updated the patient at the bedside  Disposition Plan: Home when medically stable    IV Access:   Peripheral IV - right antecubital  fossa Procedures and diagnostic studies:   Ct Abdomen Pelvis Wo Contrast 12/08/2013 Advanced cirrhosis. Spleen is mildly enlarged. Negative for ascites Distended gallbladder with gallstone and mild pericholecystic fluid. Correlate with pain in this area Mild dilatation of the jejunum with  air-fluid levels suggesting partial obstruction of the proximal small bowel.  Abdominal ultrasound 12/09/2013 Cholelithiasis with borderline/mild gallbladder wall thickening. No pericholecystic edema or tenderness with gallbladder palpation to confirm acute cholecystitis. 2. Cirrhosis with trace perihepatic ascites.  EGD 8/21 --> gastric ulcers and MWT  DG abdomen 12/11/2013 - no evidence of SBO or large bowel obstruction  Medical Consultants:   Surgery  GI (Dr. Kennedy Bucker)   Other Consultants:   Physical therapy  Social work for substance abuse   Anti-Infectives:   Zosyn 12/08/2013 -->   Faye Ramsay, MD  [facility] Pager [phone]  If 7PM-7AM, please contact night-coverage [email or web address] Password [email or web address] 12/12/2013, 2:12 PM   LOS: 4 days   HPI/Subjective: No events overnight.   Objective: Filed Vitals:   12/11/13 0544 12/11/13 1349 12/11/13 2151 12/12/13 0558  BP: 106/72 110/85 111/66 112/78  Pulse: 89 71 71 86  Temp: 98.7 F (37.1 C) 99.3 F (37.4 C) 98.7 F (37.1 C) 98.5 F (36.9 C)  TempSrc: Oral Oral Oral Oral  Resp: 16 16 16 18   Height:      Weight:      SpO2: 95% 97% 95% 98%    Intake/Output Summary (Last 24 hours) at 12/12/13 1412 Last data filed at 12/12/13 1001  Gross per 24 hour  Intake    600 ml  Output   1250 ml  Net   -650 ml    Exam:   General:  Pt is alert, follows commands appropriately, not in acute distress  Cardiovascular: Regular rate and rhythm, S1/S2 appreciated   Respiratory: Clear to auscultation bilaterally, no wheezing, no crackles, no rhonchi  Abdomen: Soft, some tenderness topalpation, non distended, bowel sounds present, no guarding  Extremities: Pulses DP and PT palpable bilaterally  Neuro: Grossly nonfocal  Data Reviewed: Basic Metabolic Panel:  Recent Labs Lab 12/08/13 1535 12/08/13 2025 12/10/13 0505 12/12/13 0627  NA 139 141 137 141  K 3.8 3.5* 4.2 3.9  CL 96 100 97 101  CO2 25 27 32 33*  GLUCOSE 386*  248* 288* 195*  BUN 4* 4* 7 4*  CREATININE 0.45* 0.41* 0.53 0.53  CALCIUM 9.6 9.2 9.2 9.3   Liver Function Tests:  Recent Labs Lab 12/08/13 1535 12/08/13 2025 12/10/13 0505  AST 149* 127* 75*  ALT 149* 136* 96*  ALKPHOS 127* 117 101  BILITOT 1.1 1.0 1.3*  PROT 8.5* 7.7 7.1  ALBUMIN 3.7 3.3* 2.9*    Recent Labs Lab 12/08/13 1535  LIPASE 55   No results found for this basename: AMMONIA,  in the last 168 hours CBC:  Recent Labs Lab 12/08/13 1535 12/08/13 2025 12/10/13 0505 12/12/13 0627  WBC 3.3* 3.5* 4.2 2.2*  NEUTROABS 1.8  --   --   --   HGB 14.7 13.8 14.3 14.1  HCT 42.8 39.9 42.1 41.4  MCV 102.1* 100.8* 105.8* 103.2*  PLT 63* 55* 37* 53*   Cardiac Enzymes: No results found for this basename: CKTOTAL, CKMB, CKMBINDEX, TROPONINI,  in the last 168 hours BNP: No components found with this basename: POCBNP,  CBG:  Recent Labs Lab 12/11/13 1208 12/11/13 1615 12/11/13 2122 12/12/13 0722 12/12/13 1152  GLUCAP 90 123* 175* 228* 108*    No results found for this or  any previous visit (from the past 240 hour(s)).   Scheduled Meds: . folic acid  1 mg Oral Daily  . insulin aspart  0-15 Units Subcutaneous TID WC  . insulin aspart  0-5 Units Subcutaneous QHS  . insulin aspart  5 Units Subcutaneous TID AC  . insulin glargine  15 Units Subcutaneous QHS  . linagliptin  5 mg Oral Daily  . multivitamin   1 tablet Oral Daily  . pantoprazole   40 mg Intravenous Q12H  . piperacillin-tazobactam  3.375 g Intravenous Q8H  . sertraline  150 mg Oral Daily  . thiamine  100 mg Oral Daily   Or  . thiamine  100 mg Intravenous Daily   Continuous Infusions: . sodium chloride Stopped (12/10/13 0845)  . dextrose 5 % and 0.45 % NaCl with KCl 20 mEq/L 75 mL/hr at 12/12/13 0457

## 2013-12-12 NOTE — Progress Notes (Addendum)
Hypoglycemic Event  CBG: 56  Treatment: 15 GM carbohydrate snack  Symptoms: Shaky  Follow-up CBG: Time:2205 CBG Result:105  Possible Reasons for Event: Medication regimen  Comments/MD notified :Shella Spearing  Remember to initiate Hypoglycemia Order Set & complete

## 2013-12-12 NOTE — Progress Notes (Signed)
Clinical Social Work Department BRIEF PSYCHOSOCIAL ASSESSMENT 12/12/2013  Patient:  [name],[name]     Account Number:  [id number]     Admit date:  12/08/2013  Clinical Social Worker:  ,, LCSW  Date/Time:  12/12/2013 11:15 AM  Referred by:  Physician  Date Referred:  12/12/2013 Referred for  Substance Abuse   Other Referral:   Interview type:  Patient Other interview type:    PSYCHOSOCIAL DATA Living Status:  FAMILY Admitted from facility:   Level of care:   Primary support name:  [name] Primary support relationship to patient:  SPOUSE Degree of support available:   Adequate    CURRENT CONCERNS Current Concerns  Substance Abuse   Other Concerns:    SOCIAL WORK ASSESSMENT / PLAN CSW received referral in order to assist with substance abuse concerns. CSW reviewed chart and met with patient at bedside. CSW introduced myself and explained role.    Patient reports she lives at home with husband. Patient and husband have been married for over 20 years. Patient was married previously but husband was killed in a car accident. Patient has 3 children by first marriage. Patient reports that all children live in [place] and she has a close relationship with them.    CSW spoke with patient about her emotional wellbeing and any connections with substance use. Patient reports that husband recently received his 3rd DUI and has a court hearing on 9/4. Depending on court hearing, husband could face up to 6 years in prison. Patient reports it would be difficult to manage without husband and she is currently in the process of applying for disability. Patient reports that due to stressors and husband's legal problems, she has felt depressed for the past 2-3 years. Patient's PCP prescribed her Zoloft and patient reports she is compliant with medications. Patient reports that she used to rely on alcohol and would drink liquor when she was upset. Patient was drinking large amounts on a daily basis  and went to [facility] a few months ago to detox. Patient reports she has reduced her consumption dramatically and now only drinks about 2 days out of the week. Patient reports she drinks 1-4 wine coolers. Patient is aware of MD concern's with alcohol use and reports she is no longer using alcohol to cope. Patient reports she does not feel addicted to alcohol and that she could eliminate use altogether. CSW spoke with patient about options for treatment. Patient currently does not have transportation and does not live on the bus line. Patient reports she is aware of AA meeting that is within walking distance of her house and that she would consider joining for additional support.    Patient reports no further concerns at this time and politely declines and further substance abuse treatment due to transportation issues. Patient reports she will reduce/eliminate her use and thanked CSW for visit.   Assessment/plan status:  Referral to Community Resources Other assessment/ plan:   SBIRT   Information/referral to community resources:   AA meetings    PATIENT'S/FAMILY'S RESPONSE TO PLAN OF CARE: Patient alert and oriented. Patient aware of alcohol use and dangers to health. Patient open to discussing depression and the need to rely on positive coping skills to avoid alcohol. Patient has several family stressors currently and is worried about husband's upcoming court date. Patient agreeable to AA meetings but does not feel that more intensive treatment is needed at this time.        , LCSW (Weekend Coverage) 

## 2013-12-13 ENCOUNTER — Encounter (HOSPITAL_COMMUNITY): Payer: Self-pay | Admitting: Gastroenterology

## 2013-12-13 LAB — CBC
HCT: 38.9 % (ref 36.0–46.0)
Hemoglobin: 13.1 g/dL (ref 12.0–15.0)
MCH: 34.9 pg — AB (ref 26.0–34.0)
MCHC: 33.7 g/dL (ref 30.0–36.0)
MCV: 103.7 fL — AB (ref 78.0–100.0)
Platelets: 60 10*3/uL — ABNORMAL LOW (ref 150–400)
RBC: 3.75 MIL/uL — AB (ref 3.87–5.11)
RDW: 13.2 % (ref 11.5–15.5)
WBC: 2.1 10*3/uL — AB (ref 4.0–10.5)

## 2013-12-13 LAB — COMPREHENSIVE METABOLIC PANEL WITH GFR
ALT: 86 U/L — ABNORMAL HIGH (ref 0–35)
AST: 93 U/L — ABNORMAL HIGH (ref 0–37)
Albumin: 2.8 g/dL — ABNORMAL LOW (ref 3.5–5.2)
Alkaline Phosphatase: 98 U/L (ref 39–117)
Anion gap: 6 (ref 5–15)
BUN: 4 mg/dL — ABNORMAL LOW (ref 6–23)
CO2: 34 meq/L — ABNORMAL HIGH (ref 19–32)
Calcium: 9.4 mg/dL (ref 8.4–10.5)
Chloride: 99 meq/L (ref 96–112)
Creatinine, Ser: 0.59 mg/dL (ref 0.50–1.10)
GFR calc Af Amer: >90 mL/min
GFR calc non Af Amer: >90 mL/min
Glucose, Bld: 217 mg/dL — ABNORMAL HIGH (ref 70–99)
Potassium: 4.5 meq/L (ref 3.7–5.3)
Sodium: 139 meq/L (ref 137–147)
Total Bilirubin: 1.4 mg/dL — ABNORMAL HIGH (ref 0.3–1.2)
Total Protein: 7 g/dL (ref 6.0–8.3)

## 2013-12-13 LAB — GLUCOSE, CAPILLARY
Glucose-Capillary: 206 mg/dL — ABNORMAL HIGH (ref 70–99)
Glucose-Capillary: 215 mg/dL — ABNORMAL HIGH (ref 70–99)
Glucose-Capillary: 71 mg/dL (ref 70–99)

## 2013-12-13 MED ORDER — HYDROMORPHONE HCL 2 MG PO TABS: 2.0000 mg | ORAL_TABLET | ORAL | Status: DC | PRN

## 2013-12-13 MED ORDER — ESOMEPRAZOLE MAGNESIUM 40 MG PO CPDR: 40.0000 mg | DELAYED_RELEASE_CAPSULE | Freq: Two times a day (BID) | ORAL | Status: DC

## 2013-12-13 MED ORDER — OXYCODONE-ACETAMINOPHEN 5-325 MG PO TABS
1.0000 | ORAL_TABLET | ORAL | Status: DC | PRN
Start: 2013-12-13 — End: 2013-12-13
  Administered 2013-12-13: 2 via ORAL
  Filled 2013-12-13: qty 2

## 2013-12-13 MED ORDER — ONDANSETRON HCL 4 MG PO TABS: 4.0000 mg | ORAL_TABLET | Freq: Four times a day (QID) | ORAL | Status: DC | PRN

## 2013-12-13 NOTE — Progress Notes (Signed)
Nurse reviewed discharge instructions with pt.  Pt verbalized understanding of new medications, follow up appointments and discharge instructions.  No concerns at time of discharge. 

## 2013-12-13 NOTE — Discharge Instructions (Signed)
Mallory-Weiss Syndrome Mallory-Weiss syndrome refers to bleeding from tears in the lining of the esophagus near where it meets the stomach. This is often caused by forceful vomiting, retching or coughing. This condition is often associated with alcoholism. Usually the bleeding stops by itself after 24 to 48 hours. Sometimes endoscopic or surgical treatment is needed. This condition is not usually fatal. SYMPTOMS  Vomiting of bright red or black coffee ground like material.  Black, tarry stools.  Low blood pressure causing you to feel faint or experience loss of consciousness. DIAGNOSIS  Definitive diagnosis is by endoscopy. Treatment is usually supportive. Persistent bleeding is uncommon. Sometimes cauterization or injection of epinephrine to stop the bleeding is used during the diagnostic endoscopy. Embolization (obstruction) of the arteries supplying the area of bleeding is sometimes used to stop the bleeding.  An NG tube (naso-gastric tube) may be inserted to determine where the bleeding is coming from.  Often an EGD (esophagogastroduodenoscopy) is done. In this procedure there is a small flexible tube-like telescope (endoscope) put into your mouth, through your esophagus (the food tube leading from your mouth to your stomach), down into your stomach and into the small bowel. Through this your caregiver can see what and where the problem is. TREATMENT  It is necessary to stop the bleeding as soon as possible. During the EGD, your caregiver may inject medication into bleeding vessels to clot them.  SEEK IMMEDIATE MEDICAL CARE IF:  You have persistent dizziness, lightheadedness, or fainting.  Your vomiting returns and you have blood in your stools.  You have vomit that is bright red blood or black coffee ground-like blood, bright red blood in the stool or black tarry stools.  You have chest pain.  You cannot eat or drink.  You have nausea or vomiting. MAKE SURE YOU:   Understand  these instructions.  Will watch your condition.  Will get help right away if you are not doing well or get worse. Document Released: 08/26/2005 Document Revised: 07/01/2011 Document Reviewed: 06/02/2013 [facility] Patient Information 2015 [place], [place]. This information is not intended to replace advice given to you by your health care provider. Make sure you discuss any questions you have with your health care provider.

## 2013-12-13 NOTE — Discharge Summary (Signed)
Physician Discharge Summary  [name] HGD:[id number] DOB: 04-08-1966 DOA: 12/08/2013  PCP: Cathlean Cower, MD  Admit date: 12/08/2013 Discharge date: 12/13/2013  Recommendations for Outpatient Follow-up:  1. Pt will need to follow up with PCP in 2-3 weeks post discharge 2. Please obtain CMP to evaluate electrolytes and kidney function 3. Please also check CBC to evaluate Hg and Hct levels 4. Pt advised to continue taking Nexium 5. Pt also advised to see GI specialist at [facility]  Discharge Diagnoses:  Principal Problem:   Abdominal pain, right upper quadrant Active Problems:   Cholecystitis   Partial small bowel obstruction   Acute alcohol intoxication   HEPATITIS C   Cirrhosis   Type II or unspecified type diabetes mellitus without mention of complication, uncontrolled   MDD (major depressive disorder)   Hematemesis   Abdominal pain, generalized  Discharge Condition: Stable  Diet recommendation: Heart healthy diet discussed in details   Brief narrative:  48 y.o. female with past medical history of hepatitis C, liver cirrhosis, depression, DM type II who presented to [facility] ED 12/08/2013 with right upper quadrant abdominal pain, sharp, intermittent, 10/10 in intensity when the pain is present. Pain is assocaited with nausea and poor oral intake. No fevers or chills. No diarrhea. Work up in ED included CT abdomen which showed findings suspicious for possible cholecystitis and partial small bowel obstruction. In addition, her alcohol level was 172 on this admission. AST was 149, ALT was 149, ALP 127, normal bilirubin and normal lipase level. Patient underwent EGD 12/10/2013 with evidence of gastric ulcers and MWT. Most recent abdominal x ray did not reveal SBO or large bowel obstruction.   Assessment and Plan:   Principal Problem:  Abdominal pain, right upper quadrant in the setting of partial small bowel obstruction / Probable acute cholecystitis  Partial small bowel obstruction seen  on CT abdomen. In addition, CT abdomen revealed distended gallbladder with gallstone and mild pericholecystic fluid. Abdominal US done 12/09/2013 - findings of cholelithiasis with borderline gallbladder wall thickening but no evidence of acute cholecystis. Pt was started on empiric zosyn for possible cholecystitis. Will change to PO regimen prior to discharge for about 7-10 days.  Based on repeat imaging studies, no evidence of sball bowel or large bowel obstruction (abdominal X ray 12/11/2013).  No fevers. Normal WBC count. Trend LFT's. AST 149 --> 127 --> 75; ALT 149 --> 127 --> 69; ALP 127 --> 117; normal lipase.  Continue PPI for now. Active Problems:  Hematemesis /Gasrtic ulcers / MWT  Blood seen in NGT. EGD with MWT and gastric ulcers. Per GI, avoid NSAID's, EtOH. Needs to take PPI BID for 2 weeks and the PPI daily indefinitely.  Continue protonix 40 mg Q 12 hours.  Continue antiemetics as needed.  Diet advanced to soft and pt tolerating well  Acute alcohol intoxication  CIWA protocol order in place. Alcohol level 172 on this admission  No reports of withdrawals.  Continue thiamine, folic acid and multivitamin while inpatient  Liver cirrhosis in the setting of hepatitis C and alcohol induced hepatitis  History of alcohol and substance abuse.  Alcohol 172 on this admission. CIWA protocol order in place.  LFT's still elevated and needs close follow up in an outpatient setting  Abnormal LFT's  Secondary to history of alcohol abuse. Improving.  AFP tumor marker obtained and high at 8.9. No evidence of malignancy on CT abdomen.  Hypokalemia  Secondary to GI losses. Supplemented  Thrombocytopenia  Likely bone marrow suppression  from alcohol abuse. Platelet count stabilizing Diabetes mellitus  A1c is 9.5 on this admission indicating poor glycemic control.  Continue Lantus 15 units daily and novolog 5 units TIDAC and SSI.  Continue tradjenta.  MDD (major depressive disorder)  Continue  sertraline  History of substance abuse  UDS positive for opiates.  SW consulted for substance abuse to provide resources  Moderate protein calorie malnutrition  Secondary to acute illness imposed on chronic alcohol abuse and poor oral intake   Code Status: Full  Family Communication: Updated the patient at the bedside   IV Access:   Peripheral IV - right antecubital fossa Procedures and diagnostic studies:   Ct Abdomen Pelvis Wo Contrast 12/08/2013 Advanced cirrhosis. Spleen is mildly enlarged. Negative for ascites Distended gallbladder with gallstone and mild pericholecystic fluid. Correlate with pain in this area Mild dilatation of the jejunum with air-fluid levels suggesting partial obstruction of the proximal small bowel.  Abdominal ultrasound 12/09/2013 Cholelithiasis with borderline/mild gallbladder wall thickening. No pericholecystic edema or tenderness with gallbladder palpation to confirm acute cholecystitis. 2. Cirrhosis with trace perihepatic ascites.  EGD 8/21 --> gastric ulcers and MWT  DG abdomen 12/11/2013 - no evidence of SBO or large bowel obstruction   Medical Consultants:   Surgery - signed off, clear pt for d/c GI (Dr. Kennedy Bucker) - cleared pt for d/c  Other Consultants:   Physical therapy  Social work for substance abuse   Anti-Infectives:   Zosyn 12/08/2013 --> 8/24  Discharge Exam: Filed Vitals:   12/13/13 0530  BP: 106/72  Pulse: 68  Temp: 97.7 F (36.5 C)  Resp: 18   Filed Vitals:   12/12/13 1758 12/12/13 1833 12/12/13 2145 12/13/13 0530  BP: 88/56 111/69 100/68 106/72  Pulse: 67 67 74 68  Temp: 98.2 F (36.8 C)  98.2 F (36.8 C) 97.7 F (36.5 C)  TempSrc: Oral  Oral Oral  Resp: 16  18 18   Height:      Weight:      SpO2: 95%  93% 96%    General: Pt is alert, follows commands appropriately, not in acute distress Cardiovascular: Regular rate and rhythm, S1/S2 +, no murmurs, no rubs, no gallops Respiratory: Clear to auscultation bilaterally,  no wheezing, no crackles, no rhonchi Abdominal: Soft, non tender, non distended, bowel sounds +, no guarding Extremities: no edema, no cyanosis, pulses palpable bilaterally DP and PT Neuro: Grossly nonfocal  Discharge Instructions     Medication List         AZO BLADDER CONTROL/GO-LESS PO  Take 1 tablet by mouth once as needed (urinary tract symptoms).     chlorpheniramine 4 MG tablet  Commonly known as:  CHLOR-TRIMETON  Take 4 mg by mouth once as needed for allergies.     esomeprazole 40 MG capsule  Commonly known as:  NEXIUM  Take 1 capsule (40 mg total) by mouth 2 (two) times daily before a meal.     glucose blood test strip  1 each by Other route 3 (three) times daily. Use as instructed     HYDROmorphone 2 MG tablet  Commonly known as:  DILAUDID  Take 1 tablet (2 mg total) by mouth every 4 (four) hours as needed for severe pain.     insulin aspart 100 UNIT/ML injection  Commonly known as:  novoLOG  Inject 5 Units into the skin 3 (three) times daily before meals.     insulin glargine 100 UNIT/ML injection  Commonly known as:  LANTUS  Inject 15 Units  into the skin at bedtime. @@ 10 pm     Iron 28 MG Tabs  Take 1 tablet by mouth daily.     Lancets Misc  1 each by Does not apply route 3 (three) times daily.     linagliptin 5 MG Tabs tablet  Commonly known as:  TRADJENTA  Take 1 tablet (5 mg total) by mouth daily. For diabetes     metFORMIN 500 MG tablet  Commonly known as:  GLUCOPHAGE  Take 500 mg by mouth 2 (two) times daily with a meal.     ondansetron 4 MG tablet  Commonly known as:  ZOFRAN  Take 1 tablet (4 mg total) by mouth every 6 (six) hours as needed for nausea.     sertraline 100 MG tablet  Commonly known as:  ZOLOFT  Take 150 mg by mouth daily.     VISINE 0.05 % ophthalmic solution  Generic drug:  tetrahydrozoline  Place 1 drop into both eyes every morning.     zolpidem 10 MG tablet  Commonly known as:  AMBIEN  Take 10 mg by mouth at  bedtime as needed for sleep.           Follow-up Information   Schedule an appointment as soon as possible for a visit with Cathlean Cower, MD.   Specialties:  Internal Medicine, Radiology   Contact information:   [place] [address] [phone]       Follow up with Faye Ramsay, MD. (As needed, If symptoms worsen, call my cell [phone])    Specialty:  Internal Medicine   Contact information:   [address] [place] [address] [phone]        The results of significant diagnostics from this hospitalization (including imaging, microbiology, ancillary and laboratory) are listed below for reference.     Microbiology: No results found for this or any previous visit (from the past 240 hour(s)).   Labs: Basic Metabolic Panel:  Recent Labs Lab 12/08/13 1535 12/08/13 2025 12/10/13 0505 12/12/13 0627 12/13/13 0457  NA 139 141 137 141 139  K 3.8 3.5* 4.2 3.9 4.5  CL 96 100 97 101 99  CO2 25 27 32 33* 34*  GLUCOSE 386* 248* 288* 195* 217*  BUN 4* 4* 7 4* 4*  CREATININE 0.45* 0.41* 0.53 0.53 0.59  CALCIUM 9.6 9.2 9.2 9.3 9.4   Liver Function Tests:  Recent Labs Lab 12/08/13 1535 12/08/13 2025 12/10/13 0505 12/13/13 0457  AST 149* 127* 75* 93*  ALT 149* 136* 96* 86*  ALKPHOS 127* 117 101 98  BILITOT 1.1 1.0 1.3* 1.4*  PROT 8.5* 7.7 7.1 7.0  ALBUMIN 3.7 3.3* 2.9* 2.8*    Recent Labs Lab 12/08/13 1535  LIPASE 55   No results found for this basename: AMMONIA,  in the last 168 hours CBC:  Recent Labs Lab 12/08/13 1535 12/08/13 2025 12/10/13 0505 12/12/13 0627 12/13/13 0457  WBC 3.3* 3.5* 4.2 2.2* 2.1*  NEUTROABS 1.8  --   --   --   --   HGB 14.7 13.8 14.3 14.1 13.1  HCT 42.8 39.9 42.1 41.4 38.9  MCV 102.1* 100.8* 105.8* 103.2* 103.7*  PLT 63* 55* 37* 53* 60*   Cardiac Enzymes: No results found for this basename: CKTOTAL, CKMB, CKMBINDEX, TROPONINI,  in the last 168 hours BNP: BNP (last 3  results) No results found for this basename: PROBNP,  in the last 8760 hours CBG:  Recent Labs  Lab 12/12/13 2204 12/12/13 2225 12/12/13 2249 12/13/13 0306 12/13/13 0734  GLUCAP 53* 66* 105* 206* 215*     SIGNED: Time coordinating discharge: Over 30 minutes  MAGICK-Angelea Penny, MD  [facility] 12/13/2013, 11:14 AM Pager [phone]  If 7PM-7AM, please contact night-coverage [email or web address] Password [email or web address]

## 2013-12-24 ENCOUNTER — Emergency Department (HOSPITAL_COMMUNITY): Payer: Self-pay

## 2013-12-24 ENCOUNTER — Emergency Department (HOSPITAL_COMMUNITY): Payer: BC Managed Care – PPO

## 2013-12-24 ENCOUNTER — Emergency Department (HOSPITAL_COMMUNITY)
Admission: EM | Admit: 2013-12-24 | Discharge: 2013-12-25 | Disposition: A | Discharge status: Home / Self Care | Payer: Self-pay | Attending: Emergency Medicine | Admitting: Emergency Medicine

## 2013-12-24 ENCOUNTER — Encounter (HOSPITAL_COMMUNITY): Payer: Self-pay | Admitting: Emergency Medicine

## 2013-12-24 DIAGNOSIS — D61818 Other pancytopenia: Secondary | ICD-10-CM | POA: Insufficient documentation

## 2013-12-24 DIAGNOSIS — G8929 Other chronic pain: Secondary | ICD-10-CM | POA: Insufficient documentation

## 2013-12-24 DIAGNOSIS — E119 Type 2 diabetes mellitus without complications: Secondary | ICD-10-CM | POA: Insufficient documentation

## 2013-12-24 DIAGNOSIS — K852 Alcohol induced acute pancreatitis without necrosis or infection: Secondary | ICD-10-CM

## 2013-12-24 DIAGNOSIS — F341 Dysthymic disorder: Secondary | ICD-10-CM | POA: Insufficient documentation

## 2013-12-24 DIAGNOSIS — R109 Unspecified abdominal pain: Secondary | ICD-10-CM | POA: Insufficient documentation

## 2013-12-24 DIAGNOSIS — Z87891 Personal history of nicotine dependence: Secondary | ICD-10-CM | POA: Insufficient documentation

## 2013-12-24 DIAGNOSIS — K219 Gastro-esophageal reflux disease without esophagitis: Secondary | ICD-10-CM | POA: Insufficient documentation

## 2013-12-24 DIAGNOSIS — Z8619 Personal history of other infectious and parasitic diseases: Secondary | ICD-10-CM | POA: Insufficient documentation

## 2013-12-24 DIAGNOSIS — K8020 Calculus of gallbladder without cholecystitis without obstruction: Secondary | ICD-10-CM | POA: Insufficient documentation

## 2013-12-24 DIAGNOSIS — Z79899 Other long term (current) drug therapy: Secondary | ICD-10-CM | POA: Insufficient documentation

## 2013-12-24 DIAGNOSIS — R112 Nausea with vomiting, unspecified: Secondary | ICD-10-CM

## 2013-12-24 DIAGNOSIS — Z794 Long term (current) use of insulin: Secondary | ICD-10-CM | POA: Insufficient documentation

## 2013-12-24 DIAGNOSIS — K859 Acute pancreatitis without necrosis or infection, unspecified: Secondary | ICD-10-CM | POA: Insufficient documentation

## 2013-12-24 DIAGNOSIS — F1010 Alcohol abuse, uncomplicated: Secondary | ICD-10-CM | POA: Insufficient documentation

## 2013-12-24 DIAGNOSIS — M549 Dorsalgia, unspecified: Secondary | ICD-10-CM | POA: Insufficient documentation

## 2013-12-24 LAB — COMPREHENSIVE METABOLIC PANEL
ALT: 157 U/L — ABNORMAL HIGH (ref 0–35)
AST: 160 U/L — ABNORMAL HIGH (ref 0–37)
Albumin: 3.4 g/dL — ABNORMAL LOW (ref 3.5–5.2)
Alkaline Phosphatase: 116 U/L (ref 39–117)
Anion gap: 15 (ref 5–15)
BUN: 8 mg/dL (ref 6–23)
CO2: 24 mEq/L (ref 19–32)
Calcium: 10 mg/dL (ref 8.4–10.5)
Chloride: 102 mEq/L (ref 96–112)
Creatinine, Ser: 0.54 mg/dL (ref 0.50–1.10)
GFR calc Af Amer: >90 mL/min (ref >90)
GFR calc non Af Amer: >90 mL/min (ref >90)
Glucose, Bld: 274 mg/dL — ABNORMAL HIGH (ref 70–99)
Potassium: 4 mEq/L (ref 3.7–5.3)
Sodium: 141 mEq/L (ref 137–147)
Total Bilirubin: 0.9 mg/dL (ref 0.3–1.2)
Total Protein: 8.8 g/dL — ABNORMAL HIGH (ref 6.0–8.3)

## 2013-12-24 LAB — CBC WITH DIFFERENTIAL/PLATELET
Basophils Absolute: 0 10*3/uL (ref 0.0–0.1)
Basophils Relative: 1 % (ref 0–1)
Eosinophils Absolute: 0.1 10*3/uL (ref 0.0–0.7)
Eosinophils Relative: 2 % (ref 0–5)
HCT: 48.5 % — ABNORMAL HIGH (ref 36.0–46.0)
Hemoglobin: 17 g/dL — ABNORMAL HIGH (ref 12.0–15.0)
Lymphocytes Relative: 50 % — ABNORMAL HIGH (ref 12–46)
Lymphs Abs: 2.3 10*3/uL (ref 0.7–4.0)
MCH: 35.5 pg — ABNORMAL HIGH (ref 26.0–34.0)
MCHC: 35.1 g/dL (ref 30.0–36.0)
MCV: 101.3 fL — ABNORMAL HIGH (ref 78.0–100.0)
Monocytes Absolute: 0.4 10*3/uL (ref 0.1–1.0)
Monocytes Relative: 8 % (ref 3–12)
Neutro Abs: 1.8 10*3/uL (ref 1.7–7.7)
Neutrophils Relative %: 40 % — ABNORMAL LOW (ref 43–77)
Platelets: 81 10*3/uL — ABNORMAL LOW (ref 150–400)
RBC: 4.79 MIL/uL (ref 3.87–5.11)
RDW: 13 % (ref 11.5–15.5)
WBC: 4.6 10*3/uL (ref 4.0–10.5)

## 2013-12-24 LAB — LIPASE, BLOOD: Lipase: 80 U/L — ABNORMAL HIGH (ref 11–59)

## 2013-12-24 LAB — URINE MICROSCOPIC-ADD ON

## 2013-12-24 LAB — URINALYSIS, ROUTINE W REFLEX MICROSCOPIC
Bilirubin Urine: NEGATIVE
Glucose, UA: >1000 mg/dL — AB
Ketones, ur: NEGATIVE mg/dL
Nitrite: POSITIVE — AB
Protein, ur: NEGATIVE mg/dL
Specific Gravity, Urine: 1.024 (ref 1.005–1.030)
Urobilinogen, UA: 1 mg/dL (ref 0.0–1.0)
pH: 5.5 (ref 5.0–8.0)

## 2013-12-24 LAB — RAPID URINE DRUG SCREEN, HOSP PERFORMED
Amphetamines: NOT DETECTED
Barbiturates: NOT DETECTED
Benzodiazepines: NOT DETECTED
Cocaine: NOT DETECTED
Opiates: NOT DETECTED
Tetrahydrocannabinol: NOT DETECTED

## 2013-12-24 LAB — ETHANOL: Alcohol, Ethyl (B): 193 mg/dL — ABNORMAL HIGH (ref 0–11)

## 2013-12-24 MED ORDER — DEXTROSE 5 % IV SOLN
1.0000 g | Freq: Once | INTRAVENOUS | Status: AC
  Administered 2013-12-24: 1 g via INTRAVENOUS
  Filled 2013-12-24: qty 10

## 2013-12-24 MED ORDER — HYDROMORPHONE HCL PF 1 MG/ML IJ SOLN
1.0000 mg | Freq: Once | INTRAMUSCULAR | Status: AC
Start: 2013-12-24 — End: 2013-12-24
  Administered 2013-12-24: 1 mg via INTRAVENOUS
  Filled 2013-12-24: qty 1

## 2013-12-24 MED ORDER — HYDROMORPHONE HCL PF 1 MG/ML IJ SOLN
1.0000 mg | Freq: Once | INTRAMUSCULAR | Status: AC
  Administered 2013-12-24: 1 mg via INTRAVENOUS
  Filled 2013-12-24: qty 1

## 2013-12-24 MED ORDER — ACETAMINOPHEN 500 MG PO TABS: 1000.0000 mg | ORAL_TABLET | Freq: Once | ORAL | Status: DC

## 2013-12-24 MED ORDER — ONDANSETRON HCL 4 MG/2ML IJ SOLN
4.0000 mg | Freq: Once | INTRAMUSCULAR | Status: AC
  Administered 2013-12-24: 4 mg via INTRAVENOUS
  Filled 2013-12-24: qty 2

## 2013-12-24 MED ORDER — IOHEXOL 300 MG/ML  SOLN
100.0000 mL | Freq: Once | INTRAMUSCULAR | Status: AC | PRN
  Administered 2013-12-24: 100 mL via INTRAVENOUS
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
## 2013-12-24 NOTE — ED Notes (Signed)
Patient transported to X-ray 

## 2013-12-24 NOTE — ED Notes (Signed)
Patient transported to CT 
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
## 2013-12-25 MED ORDER — FAMOTIDINE 20 MG PO TABS
40.0000 mg | ORAL_TABLET | Freq: Once | ORAL | Status: AC
  Administered 2013-12-25: 40 mg via ORAL
  Filled 2013-12-25: qty 2

## 2013-12-25 MED ORDER — CEPHALEXIN 500 MG PO CAPS: 500.0000 mg | ORAL_CAPSULE | Freq: Two times a day (BID) | ORAL | Status: DC

## 2013-12-25 MED ORDER — FAMOTIDINE 20 MG PO TABS: 20.0000 mg | ORAL_TABLET | Freq: Two times a day (BID) | ORAL | Status: DC

## 2013-12-25 MED ORDER — OXYCODONE-ACETAMINOPHEN 5-325 MG PO TABS: 1.0000 | ORAL_TABLET | Freq: Four times a day (QID) | ORAL | Status: DC | PRN

## 2013-12-25 MED ORDER — PROMETHAZINE HCL 25 MG PO TABS: 25.0000 mg | ORAL_TABLET | Freq: Four times a day (QID) | ORAL | Status: DC | PRN

## 2013-12-25 NOTE — Discharge Instructions (Signed)
Acute Pancreatitis °Acute pancreatitis is a disease in which the pancreas becomes suddenly irritated (inflamed). The pancreas is a large gland behind your stomach. The pancreas makes enzymes that help digest food. The pancreas also makes 2 hormones that help control your blood sugar. Acute pancreatitis happens when the enzymes attack and damage the pancreas. Most attacks last a couple of days and can cause serious problems. °HOME CARE °· Follow your doctor's diet instructions. You may need to avoid alcohol and limit fat in your diet. °· Eat small meals often. °· Drink enough fluids to keep your pee (urine) clear or pale yellow. °· Only take medicines as told by your doctor. °· Avoid drinking alcohol if it caused your disease. °· Do not smoke. °· Get plenty of rest. °· Check your blood sugar at home as told by your doctor. °· Keep all doctor visits as told. °GET HELP IF: °· You do not get better as quickly as expected. °· You have new or worsening symptoms. °· You have lasting pain, weakness, or feel sick to your stomach (nauseous). °· You get better and then have another pain attack. °GET HELP RIGHT AWAY IF:  °· You are unable to eat or keep fluids down. °· Your pain becomes severe. °· You have a fever or lasting symptoms for more than 2 to 3 days. °· You have a fever and your symptoms suddenly get worse. °· Your skin or the white part of your eyes turn yellow (jaundice). °· You throw up (vomit). °· You feel dizzy, or you pass out (faint). °· Your blood sugar is high (over 300 mg/dL). °MAKE SURE YOU:  °· Understand these instructions. °· Will watch your condition. °· Will get help right away if you are not doing well or get worse. °Document Released: 09/25/2007 Document Revised: 08/23/2013 Document Reviewed: 07/18/2011 °[employer]® Patient Information ©2015 [employer]. This information is not intended to replace advice given to you by your health care provider. Make sure you discuss any questions you have with your  health care provider. ° °

## 2013-12-25 NOTE — ED Provider Notes (Signed)
See prior note   Janice Norrie, MD 12/25/13 1504

## 2014-02-13 ENCOUNTER — Encounter (HOSPITAL_COMMUNITY): Payer: Self-pay | Admitting: Emergency Medicine

## 2014-02-13 ENCOUNTER — Inpatient Hospital Stay (HOSPITAL_COMMUNITY)
Admission: EM | Admit: 2014-02-13 | Discharge: 2014-02-19 | DRG: 871 | Disposition: A | Discharge status: Home / Self Care | Payer: Self-pay | Attending: Internal Medicine | Admitting: Internal Medicine

## 2014-02-13 DIAGNOSIS — K652 Spontaneous bacterial peritonitis: Secondary | ICD-10-CM | POA: Diagnosis present

## 2014-02-13 DIAGNOSIS — Z888 Allergy status to other drugs, medicaments and biological substances status: Secondary | ICD-10-CM

## 2014-02-13 DIAGNOSIS — R1011 Right upper quadrant pain: Secondary | ICD-10-CM

## 2014-02-13 DIAGNOSIS — E1165 Type 2 diabetes mellitus with hyperglycemia: Secondary | ICD-10-CM | POA: Diagnosis present

## 2014-02-13 DIAGNOSIS — K625 Hemorrhage of anus and rectum: Secondary | ICD-10-CM

## 2014-02-13 DIAGNOSIS — D125 Benign neoplasm of sigmoid colon: Secondary | ICD-10-CM | POA: Diagnosis present

## 2014-02-13 DIAGNOSIS — F10239 Alcohol dependence with withdrawal, unspecified: Secondary | ICD-10-CM | POA: Diagnosis present

## 2014-02-13 DIAGNOSIS — F10288 Alcohol dependence with other alcohol-induced disorder: Secondary | ICD-10-CM

## 2014-02-13 DIAGNOSIS — R109 Unspecified abdominal pain: Secondary | ICD-10-CM | POA: Diagnosis present

## 2014-02-13 DIAGNOSIS — R748 Abnormal levels of other serum enzymes: Secondary | ICD-10-CM

## 2014-02-13 DIAGNOSIS — K920 Hematemesis: Secondary | ICD-10-CM | POA: Diagnosis present

## 2014-02-13 DIAGNOSIS — E876 Hypokalemia: Secondary | ICD-10-CM | POA: Diagnosis not present

## 2014-02-13 DIAGNOSIS — K7011 Alcoholic hepatitis with ascites: Secondary | ICD-10-CM | POA: Diagnosis present

## 2014-02-13 DIAGNOSIS — K922 Gastrointestinal hemorrhage, unspecified: Secondary | ICD-10-CM | POA: Diagnosis present

## 2014-02-13 DIAGNOSIS — K8020 Calculus of gallbladder without cholecystitis without obstruction: Secondary | ICD-10-CM | POA: Diagnosis present

## 2014-02-13 DIAGNOSIS — D709 Neutropenia, unspecified: Secondary | ICD-10-CM | POA: Diagnosis present

## 2014-02-13 DIAGNOSIS — D6959 Other secondary thrombocytopenia: Secondary | ICD-10-CM | POA: Diagnosis present

## 2014-02-13 DIAGNOSIS — F329 Major depressive disorder, single episode, unspecified: Secondary | ICD-10-CM | POA: Diagnosis present

## 2014-02-13 DIAGNOSIS — K7031 Alcoholic cirrhosis of liver with ascites: Secondary | ICD-10-CM

## 2014-02-13 DIAGNOSIS — K635 Polyp of colon: Secondary | ICD-10-CM | POA: Diagnosis present

## 2014-02-13 DIAGNOSIS — B182 Chronic viral hepatitis C: Secondary | ICD-10-CM | POA: Diagnosis present

## 2014-02-13 DIAGNOSIS — K7010 Alcoholic hepatitis without ascites: Secondary | ICD-10-CM | POA: Diagnosis present

## 2014-02-13 DIAGNOSIS — K649 Unspecified hemorrhoids: Secondary | ICD-10-CM

## 2014-02-13 DIAGNOSIS — F419 Anxiety disorder, unspecified: Secondary | ICD-10-CM | POA: Diagnosis present

## 2014-02-13 DIAGNOSIS — E871 Hypo-osmolality and hyponatremia: Secondary | ICD-10-CM | POA: Diagnosis not present

## 2014-02-13 DIAGNOSIS — E119 Type 2 diabetes mellitus without complications: Secondary | ICD-10-CM

## 2014-02-13 DIAGNOSIS — K219 Gastro-esophageal reflux disease without esophagitis: Secondary | ICD-10-CM | POA: Diagnosis present

## 2014-02-13 DIAGNOSIS — F1410 Cocaine abuse, uncomplicated: Secondary | ICD-10-CM | POA: Diagnosis present

## 2014-02-13 DIAGNOSIS — K7030 Alcoholic cirrhosis of liver without ascites: Secondary | ICD-10-CM

## 2014-02-13 DIAGNOSIS — G8929 Other chronic pain: Secondary | ICD-10-CM | POA: Diagnosis present

## 2014-02-13 DIAGNOSIS — Z87891 Personal history of nicotine dependence: Secondary | ICD-10-CM

## 2014-02-13 DIAGNOSIS — K5900 Constipation, unspecified: Secondary | ICD-10-CM | POA: Diagnosis not present

## 2014-02-13 DIAGNOSIS — Z794 Long term (current) use of insulin: Secondary | ICD-10-CM

## 2014-02-13 DIAGNOSIS — Z23 Encounter for immunization: Secondary | ICD-10-CM

## 2014-02-13 DIAGNOSIS — Z800 Family history of malignant neoplasm of digestive organs: Secondary | ICD-10-CM

## 2014-02-13 DIAGNOSIS — R1084 Generalized abdominal pain: Secondary | ICD-10-CM

## 2014-02-13 DIAGNOSIS — F1020 Alcohol dependence, uncomplicated: Secondary | ICD-10-CM | POA: Diagnosis present

## 2014-02-13 DIAGNOSIS — A419 Sepsis, unspecified organism: Principal | ICD-10-CM | POA: Diagnosis present

## 2014-02-13 DIAGNOSIS — D696 Thrombocytopenia, unspecified: Secondary | ICD-10-CM | POA: Diagnosis present

## 2014-02-13 DIAGNOSIS — Z833 Family history of diabetes mellitus: Secondary | ICD-10-CM

## 2014-02-13 DIAGNOSIS — K648 Other hemorrhoids: Secondary | ICD-10-CM | POA: Diagnosis present

## 2014-02-13 HISTORY — DX: Major depressive disorder, single episode, unspecified: F32.9

## 2014-02-13 LAB — CBC WITH DIFFERENTIAL/PLATELET
BASOS ABS: 0 10*3/uL (ref 0.0–0.1)
Basophils Relative: 1 % (ref 0–1)
EOS ABS: 0 10*3/uL (ref 0.0–0.7)
Eosinophils Relative: 1 % (ref 0–5)
HCT: 40.7 % (ref 36.0–46.0)
Hemoglobin: 14 g/dL (ref 12.0–15.0)
LYMPHS PCT: 50 % — AB (ref 12–46)
Lymphs Abs: 1.1 10*3/uL (ref 0.7–4.0)
MCH: 35.3 pg — ABNORMAL HIGH (ref 26.0–34.0)
MCHC: 34.4 g/dL (ref 30.0–36.0)
MCV: 102.5 fL — ABNORMAL HIGH (ref 78.0–100.0)
MONOS PCT: 9 % (ref 3–12)
Monocytes Absolute: 0.2 10*3/uL (ref 0.1–1.0)
NEUTROS ABS: 0.8 10*3/uL — AB (ref 1.7–7.7)
Neutrophils Relative %: 39 % — ABNORMAL LOW (ref 43–77)
PLATELETS: 37 10*3/uL — AB (ref 150–400)
RBC: 3.97 MIL/uL (ref 3.87–5.11)
RDW: 14.1 % (ref 11.5–15.5)
WBC: 2.1 10*3/uL — AB (ref 4.0–10.5)

## 2014-02-13 LAB — COMPREHENSIVE METABOLIC PANEL
ALBUMIN: 3.4 g/dL — AB (ref 3.5–5.2)
ALK PHOS: 165 U/L — AB (ref 39–117)
ALT: 117 U/L — ABNORMAL HIGH (ref 0–35)
ANION GAP: 15 (ref 5–15)
AST: 205 U/L — AB (ref 0–37)
BILIRUBIN TOTAL: 1.6 mg/dL — AB (ref 0.3–1.2)
BUN: 6 mg/dL (ref 6–23)
CHLORIDE: 94 meq/L — AB (ref 96–112)
CO2: 28 mEq/L (ref 19–32)
Calcium: 8.9 mg/dL (ref 8.4–10.5)
Creatinine, Ser: 0.36 mg/dL — ABNORMAL LOW (ref 0.50–1.10)
GFR calc Af Amer: >90 mL/min (ref >90)
GFR calc non Af Amer: >90 mL/min (ref >90)
Glucose, Bld: 297 mg/dL — ABNORMAL HIGH (ref 70–99)
Potassium: 3.9 mEq/L (ref 3.7–5.3)
Sodium: 137 mEq/L (ref 137–147)
TOTAL PROTEIN: 8.2 g/dL (ref 6.0–8.3)

## 2014-02-13 LAB — SEDIMENTATION RATE: Sed Rate: 37 mm/hr — ABNORMAL HIGH (ref 0–22)

## 2014-02-13 LAB — URINALYSIS, ROUTINE W REFLEX MICROSCOPIC
Bilirubin Urine: NEGATIVE
Glucose, UA: >1000 mg/dL — AB
KETONES UR: NEGATIVE mg/dL
LEUKOCYTES UA: NEGATIVE
NITRITE: NEGATIVE
PROTEIN: NEGATIVE mg/dL
Specific Gravity, Urine: 1.011 (ref 1.005–1.030)
Urobilinogen, UA: 0.2 mg/dL (ref 0.0–1.0)
pH: 6.5 (ref 5.0–8.0)

## 2014-02-13 LAB — TYPE AND SCREEN
ABO/RH(D): A POS
Antibody Screen: NEGATIVE

## 2014-02-13 LAB — PROTIME-INR
INR: 1.16 (ref 0.00–1.49)
Prothrombin Time: 15 seconds (ref 11.6–15.2)

## 2014-02-13 LAB — POC OCCULT BLOOD, ED: FECAL OCCULT BLD: POSITIVE — AB

## 2014-02-13 LAB — APTT: aPTT: 28 seconds (ref 24–37)

## 2014-02-13 LAB — URINE MICROSCOPIC-ADD ON

## 2014-02-13 MED ORDER — SODIUM CHLORIDE 0.9 % IV SOLN
80.0000 mg | Freq: Once | INTRAVENOUS | Status: AC
  Administered 2014-02-13: 80 mg via INTRAVENOUS
  Filled 2014-02-13: qty 80

## 2014-02-13 MED ORDER — FENTANYL CITRATE 0.05 MG/ML IJ SOLN
25.0000 ug | Freq: Once | INTRAMUSCULAR | Status: AC
  Administered 2014-02-13: 25 ug via INTRAVENOUS
  Filled 2014-02-13: qty 2

## 2014-02-13 NOTE — ED Provider Notes (Signed)
CSN: [id number]     Arrival date & time 02/13/14  1938 History   First MD Initiated Contact with Patient 02/13/14 1941     Chief Complaint  Patient presents with  . Rectal Bleeding  . Abdominal Pain  . Vaginal Bleeding     (Consider location/radiation/quality/duration/timing/severity/associated sxs/prior Treatment) HPI Comments: Pt comes in w/ vv pg back pain. abd pain, bleeding from vagina and rectum. Pt started having BRBPR, 1 week ago, with every stools (1-2 episodes/daily). Vaginal bleeding started 3 days, bright red, 2 pads changed daily.Pt is s/p hysterectomy. No hx of paracentesis. Pt also has back pain, mostly on the left lateral side. No associated numbness, weakness, urinary incontinence, urinary retention, bowel incontinence, weakness.   Patient is a 48 y.o. female presenting with hematochezia, abdominal pain, and vaginal bleeding. The history is provided by the patient and medical records.  Rectal Bleeding Associated symptoms: abdominal pain   Associated symptoms: no vomiting   Abdominal Pain Associated symptoms: hematochezia, nausea and vaginal bleeding   Associated symptoms: no chest pain, no constipation, no cough, no diarrhea, no hematuria, no shortness of breath and no vomiting   Vaginal Bleeding Associated symptoms: abdominal pain and nausea     Past Medical History  Diagnosis Date  . Impaired glucose tolerance 11/07/2010  . Hepatitis C approx dx 2005    no tx to date  . Pancreatitis     HX of  . Alcohol dependency     at least [place] admissions 2007- 09/2013 for detox.   Marland Kitchen GERD (gastroesophageal reflux disease)   . Chronic constipation   . Gallstones   . Drug dependency     Hx of cocaine use  . Anxiety and depression 08/2011  . Rheumatoid factor positive   . Hemochromatosis     last phlebotomy tx ~ 2012. liver bx 2006  . Pancytopenia   . Abnormal vaginal bleeding     uterine fibroid  . Gallstone 2006  . Chronic abdominal pain 2011  . Cirrhosis  11/09/2010  . Type II or unspecified type diabetes mellitus without mention of complication, uncontrolled 09/17/2011   Past Surgical History  Procedure Laterality Date  . Appendectomy    . Cesarean section  x 2  . Foot surgery  2013  . Abdominal hysterectomy  12/17/2011    Procedure: HYSTERECTOMY ABDOMINAL;  Surgeon: Gus Height, MD;  Location: [facility];  Service: Gynecology;  Laterality: N/A;  . Salpingoophorectomy  12/17/2011    Procedure: SALPINGO OOPHERECTOMY;  Surgeon: Gus Height, MD;  Location: [facility];  Service: Gynecology;  Laterality: Bilateral;  . Esophagogastroduodenoscopy N/A 12/10/2013    Procedure: ESOPHAGOGASTRODUODENOSCOPY (EGD);  Surgeon: Ladene Artist, MD;  Location: [facility];  Service: Endoscopy;  Laterality: N/A;   Family History  Problem Relation Age of Onset  . Cancer Mother     ovarian  . Diabetes Father    History  Substance Use Topics  . Smoking status: Former Smoker    Types: Cigarettes    Quit date: 12/09/2010  . Smokeless tobacco: Never Used  . Alcohol Use: Yes     Comment: "drink every other day"   OB History   Grav Para Term Preterm Abortions TAB SAB Ect Mult Living   2 2 2       3      Review of Systems  Constitutional: Negative for activity change.  HENT: Negative for facial swelling.   Respiratory: Negative for cough, shortness of breath and wheezing.   Cardiovascular: Negative for  chest pain.  Gastrointestinal: Positive for nausea, abdominal pain, blood in stool and hematochezia. Negative for vomiting, diarrhea, constipation and abdominal distention.  Genitourinary: Positive for vaginal bleeding. Negative for hematuria and difficulty urinating.  Musculoskeletal: Negative for neck pain.  Skin: Negative for color change.  Neurological: Negative for speech difficulty.  Hematological: Does not bruise/bleed easily.  Psychiatric/Behavioral: Negative for confusion.      Allergies  Betadine  Home Medications   Prior to Admission  medications   Medication Sig Start Date End Date Taking? Authorizing Provider  diphenhydrAMINE (BENADRYL) 25 MG tablet Take 50 mg by mouth every 6 (six) hours as needed for sleep.   Yes Historical Provider, MD  esomeprazole (NEXIUM) 40 MG capsule Take 40 mg by mouth daily as needed (heartburn).   Yes Historical Provider, MD  ibuprofen (ADVIL,MOTRIN) 200 MG tablet Take 200 mg by mouth every 6 (six) hours as needed (pain).   Yes Historical Provider, MD  insulin aspart (NOVOLOG) 100 UNIT/ML injection Inject 15 Units into the skin 3 (three) times daily before meals.    Yes Historical Provider, MD  insulin glargine (LANTUS) 100 UNIT/ML injection Inject 15 Units into the skin at bedtime. @@ 10 pm   Yes Historical Provider, MD  linagliptin (TRADJENTA) 5 MG TABS tablet Take 1 tablet (5 mg total) by mouth daily. For diabetes 09/28/13  Yes Encarnacion Slates, NP  metFORMIN (GLUCOPHAGE) 500 MG tablet Take 500 mg by mouth 2 (two) times daily with a meal.   Yes Historical Provider, MD  Multiple Vitamin (MULTIVITAMIN WITH MINERALS) TABS tablet Take 1 tablet by mouth daily.   Yes Historical Provider, MD  sertraline (ZOLOFT) 100 MG tablet Take 150 mg by mouth daily.   Yes Historical Provider, MD  tetrahydrozoline (VISINE) 0.05 % ophthalmic solution Place 1 drop into both eyes every morning.   Yes Historical Provider, MD   BP 125/84  Pulse 117  Temp(Src) 99.3 F (37.4 C) (Rectal)  Resp 17  SpO2 94%  LMP 11/01/2011 Physical Exam  Nursing note and vitals reviewed. Constitutional: She is oriented to person, place, and time. She appears well-developed and well-nourished.  HENT:  Head: Normocephalic and atraumatic.  Eyes: EOM are normal. Pupils are equal, round, and reactive to light.  Neck: Neck supple.  Cardiovascular: Normal rate, regular rhythm and normal heart sounds.   No murmur heard. Pulmonary/Chest: Effort normal. No respiratory distress.  Abdominal: Soft. She exhibits no distension. There is tenderness.  There is rebound and guarding.  Diffuse tenderness. US shows no significant ascites  Neurological: She is alert and oriented to person, place, and time.  Skin: Skin is warm and dry.    ED Course  Procedures (including critical care time) Labs Review Labs Reviewed  CBC WITH DIFFERENTIAL - Abnormal; Notable for the following:    WBC 2.1 (*)    MCV 102.5 (*)    MCH 35.3 (*)    Platelets 37 (*)    Neutrophils Relative % 39 (*)    Lymphocytes Relative 50 (*)    Neutro Abs 0.8 (*)    All other components within normal limits  COMPREHENSIVE METABOLIC PANEL - Abnormal; Notable for the following:    Chloride 94 (*)    Glucose, Bld 297 (*)    Creatinine, Ser 0.36 (*)    Albumin 3.4 (*)    AST 205 (*)    ALT 117 (*)    Alkaline Phosphatase 165 (*)    Total Bilirubin 1.6 (*)    All other  components within normal limits  URINALYSIS, ROUTINE W REFLEX MICROSCOPIC - Abnormal; Notable for the following:    Glucose, UA >1000 (*)    Hgb urine dipstick LARGE (*)    All other components within normal limits  SEDIMENTATION RATE - Abnormal; Notable for the following:    Sed Rate 37 (*)    All other components within normal limits  POC OCCULT BLOOD, ED - Abnormal; Notable for the following:    Fecal Occult Bld POSITIVE (*)    All other components within normal limits  I-STAT CG4 LACTIC ACID, ED - Abnormal; Notable for the following:    Lactic Acid, Venous 2.30 (*)    All other components within normal limits  BODY FLUID CULTURE  APTT  PROTIME-INR  URINE MICROSCOPIC-ADD ON  LACTATE DEHYDROGENASE, BODY FLUID  GLUCOSE, PERITONEAL FLUID  PROTEIN, BODY FLUID  ALBUMIN, FLUID  BODY FLUID CELL COUNT WITH DIFFERENTIAL  TYPE AND SCREEN  ABO/RH    Imaging Review No results found.   EKG Interpretation None      MDM   Final diagnoses:  Right upper quadrant pain  Abdominal pain  Alcoholic cirrhosis of liver without ascites  Elevated liver enzymes  Symptomatic cholelithiasis   Pt  with alcoholic liver cirrhosis, cholelithiasis, gastritis comes in with vaginal bleeding, hematochezia and abd pain and back pain.  Abd pain - DDX: SBP, Cholelithiasis, Perforation of viscus, cholecystitis. Diffuse tenderness. [place] really doesn't show a big ascitic area. Will give 2 gram of ceftriaxone. Repeat US and port chest to r/o free air. GI bleed - Hb is stable, but she is hemoccult +. Best to admit for serial Hb. Protonix given. Back pain - sed rate neg. No midline spine tenderness and no red flags concerning for cord compression or infection around the cord. Vaginal bleed - s/p hysterectomy - will need gyne consultation.  Dr. Claudine Mouton taking over, as the imaging are pending. Will need admission.   Varney Biles, MD 02/14/14 [phone]

## 2014-02-13 NOTE — ED Notes (Signed)
To room via EMS. Onset 2-3 days rectal bleeding and vaginal spotting.  Chronic abd pain x 5 months, 10/10 pain scale.  Pt intoxicated, reports has had around 6 shots of Vodka today, reports drinks every other day to help with the abd pain.  Pt reports she vomits blood clots every weekend.

## 2014-02-14 ENCOUNTER — Emergency Department (HOSPITAL_COMMUNITY): Payer: Self-pay

## 2014-02-14 ENCOUNTER — Emergency Department (HOSPITAL_COMMUNITY): Payer: MEDICAID

## 2014-02-14 ENCOUNTER — Inpatient Hospital Stay (HOSPITAL_COMMUNITY): Payer: Self-pay

## 2014-02-14 ENCOUNTER — Encounter (HOSPITAL_COMMUNITY): Payer: Self-pay | Admitting: *Deleted

## 2014-02-14 DIAGNOSIS — A419 Sepsis, unspecified organism: Principal | ICD-10-CM

## 2014-02-14 DIAGNOSIS — F1490 Cocaine use, unspecified, uncomplicated: Secondary | ICD-10-CM

## 2014-02-14 DIAGNOSIS — K625 Hemorrhage of anus and rectum: Secondary | ICD-10-CM

## 2014-02-14 DIAGNOSIS — R1084 Generalized abdominal pain: Secondary | ICD-10-CM

## 2014-02-14 DIAGNOSIS — D696 Thrombocytopenia, unspecified: Secondary | ICD-10-CM | POA: Diagnosis present

## 2014-02-14 DIAGNOSIS — B1920 Unspecified viral hepatitis C without hepatic coma: Secondary | ICD-10-CM

## 2014-02-14 DIAGNOSIS — K652 Spontaneous bacterial peritonitis: Secondary | ICD-10-CM

## 2014-02-14 DIAGNOSIS — R109 Unspecified abdominal pain: Secondary | ICD-10-CM | POA: Diagnosis present

## 2014-02-14 DIAGNOSIS — K922 Gastrointestinal hemorrhage, unspecified: Secondary | ICD-10-CM | POA: Diagnosis present

## 2014-02-14 DIAGNOSIS — K7010 Alcoholic hepatitis without ascites: Secondary | ICD-10-CM | POA: Diagnosis present

## 2014-02-14 DIAGNOSIS — K7030 Alcoholic cirrhosis of liver without ascites: Secondary | ICD-10-CM

## 2014-02-14 DIAGNOSIS — D72829 Elevated white blood cell count, unspecified: Secondary | ICD-10-CM

## 2014-02-14 DIAGNOSIS — F10288 Alcohol dependence with other alcohol-induced disorder: Secondary | ICD-10-CM

## 2014-02-14 DIAGNOSIS — K7031 Alcoholic cirrhosis of liver with ascites: Secondary | ICD-10-CM

## 2014-02-14 DIAGNOSIS — E876 Hypokalemia: Secondary | ICD-10-CM

## 2014-02-14 LAB — DIFFERENTIAL
BASOS PCT: 1 % (ref 0–1)
Basophils Absolute: 0 10*3/uL (ref 0.0–0.1)
Eosinophils Absolute: 0 10*3/uL (ref 0.0–0.7)
Eosinophils Relative: 1 % (ref 0–5)
LYMPHS PCT: 34 % (ref 12–46)
Lymphs Abs: 0.5 10*3/uL — ABNORMAL LOW (ref 0.7–4.0)
MONOS PCT: 10 % (ref 3–12)
Monocytes Absolute: 0.1 10*3/uL (ref 0.1–1.0)
NEUTROS PCT: 54 % (ref 43–77)
Neutro Abs: 0.8 10*3/uL — ABNORMAL LOW (ref 1.7–7.7)

## 2014-02-14 LAB — COMPREHENSIVE METABOLIC PANEL
ALT: 106 U/L — ABNORMAL HIGH (ref 0–35)
ANION GAP: 20 — AB (ref 5–15)
AST: 164 U/L — ABNORMAL HIGH (ref 0–37)
Albumin: 3.1 g/dL — ABNORMAL LOW (ref 3.5–5.2)
Alkaline Phosphatase: 144 U/L — ABNORMAL HIGH (ref 39–117)
BUN: 7 mg/dL (ref 6–23)
CALCIUM: 8.3 mg/dL — AB (ref 8.4–10.5)
CO2: 23 meq/L (ref 19–32)
CREATININE: 0.34 mg/dL — AB (ref 0.50–1.10)
Chloride: 94 mEq/L — ABNORMAL LOW (ref 96–112)
GFR calc Af Amer: >90 mL/min (ref >90)
GLUCOSE: 293 mg/dL — AB (ref 70–99)
Potassium: 3.3 mEq/L — ABNORMAL LOW (ref 3.7–5.3)
Sodium: 137 mEq/L (ref 137–147)
TOTAL PROTEIN: 7.5 g/dL (ref 6.0–8.3)
Total Bilirubin: 1.4 mg/dL — ABNORMAL HIGH (ref 0.3–1.2)

## 2014-02-14 LAB — GLUCOSE, CAPILLARY
GLUCOSE-CAPILLARY: 292 mg/dL — AB (ref 70–99)
GLUCOSE-CAPILLARY: 293 mg/dL — AB (ref 70–99)
Glucose-Capillary: 244 mg/dL — ABNORMAL HIGH (ref 70–99)

## 2014-02-14 LAB — I-STAT CG4 LACTIC ACID, ED: Lactic Acid, Venous: 2.3 mmol/L — ABNORMAL HIGH (ref 0.5–2.2)

## 2014-02-14 LAB — C-REACTIVE PROTEIN: CRP: <0.5 mg/dL — ABNORMAL LOW (ref <0.60)

## 2014-02-14 LAB — DIC (DISSEMINATED INTRAVASCULAR COAGULATION) PANEL
D-Dimer, Quant: 1.08 ug/mL-FEU — ABNORMAL HIGH (ref 0.00–0.48)
INR: 1.22 (ref 0.00–1.49)
PLATELETS: 23 10*3/uL — AB (ref 150–400)
SMEAR REVIEW: NONE SEEN

## 2014-02-14 LAB — PATHOLOGIST SMEAR REVIEW

## 2014-02-14 LAB — HIV ANTIBODY (ROUTINE TESTING W REFLEX): HIV 1&2 Ab, 4th Generation: NONREACTIVE

## 2014-02-14 LAB — DIC (DISSEMINATED INTRAVASCULAR COAGULATION)PANEL
Fibrinogen: 251 mg/dL (ref 204–475)
Prothrombin Time: 15.6 seconds — ABNORMAL HIGH (ref 11.6–15.2)
aPTT: 29 seconds (ref 24–37)

## 2014-02-14 LAB — ABO/RH: ABO/RH(D): A POS

## 2014-02-14 LAB — LIPASE, BLOOD: LIPASE: 31 U/L (ref 11–59)

## 2014-02-14 LAB — MAGNESIUM: MAGNESIUM: 1.5 mg/dL (ref 1.5–2.5)

## 2014-02-14 LAB — RPR

## 2014-02-14 MED ORDER — DEXTROSE 5 % IV SOLN
2.0000 g | INTRAVENOUS | Status: DC
  Administered 2014-02-15 – 2014-02-16 (×2): 2 g via INTRAVENOUS
  Filled 2014-02-14 (×2): qty 2

## 2014-02-14 MED ORDER — INSULIN GLARGINE 100 UNIT/ML ~~LOC~~ SOLN
10.0000 [IU] | Freq: Every day | SUBCUTANEOUS | Status: DC
  Administered 2014-02-14: 10 [IU] via SUBCUTANEOUS
  Filled 2014-02-14 (×2): qty 0.1

## 2014-02-14 MED ORDER — DEXTROSE 5 % IV SOLN
2.0000 g | Freq: Once | INTRAVENOUS | Status: AC
  Administered 2014-02-14: 2 g via INTRAVENOUS
  Filled 2014-02-14: qty 2

## 2014-02-14 MED ORDER — SINCALIDE 5 MCG IJ SOLR
INTRAMUSCULAR | Status: AC
  Administered 2014-02-14: 1.11 ug via INTRAVENOUS
  Filled 2014-02-14: qty 5

## 2014-02-14 MED ORDER — SINCALIDE 5 MCG IJ SOLR
0.0200 ug/kg | Freq: Once | INTRAMUSCULAR | Status: AC
  Administered 2014-02-14: 1.11 ug via INTRAVENOUS

## 2014-02-14 MED ORDER — ADULT MULTIVITAMIN W/MINERALS CH
1.0000 | ORAL_TABLET | Freq: Every day | ORAL | Status: DC
  Administered 2014-02-14 – 2014-02-19 (×6): 1 via ORAL
  Filled 2014-02-14 (×6): qty 1

## 2014-02-14 MED ORDER — SODIUM CHLORIDE 0.9 % IV SOLN
INTRAVENOUS | Status: DC
  Administered 2014-02-14: 07:00:00 via INTRAVENOUS

## 2014-02-14 MED ORDER — TECHNETIUM TC 99M MEBROFENIN IV KIT
5.0000 | PACK | Freq: Once | INTRAVENOUS | Status: AC | PRN
  Administered 2014-02-14: 5 via INTRAVENOUS

## 2014-02-14 MED ORDER — HYDROMORPHONE HCL 1 MG/ML IJ SOLN
1.0000 mg | INTRAMUSCULAR | Status: DC | PRN
  Administered 2014-02-14 – 2014-02-19 (×20): 1 mg via INTRAVENOUS
  Filled 2014-02-14 (×21): qty 1

## 2014-02-14 MED ORDER — LORAZEPAM 2 MG/ML IJ SOLN
1.0000 mg | Freq: Four times a day (QID) | INTRAMUSCULAR | Status: AC | PRN
  Administered 2014-02-15 – 2014-02-16 (×4): 1 mg via INTRAVENOUS
  Filled 2014-02-14 (×2): qty 1

## 2014-02-14 MED ORDER — LIDOCAINE-EPINEPHRINE 1 %-1:100000 IJ SOLN
20.0000 mL | Freq: Once | INTRAMUSCULAR | Status: DC
  Filled 2014-02-14: qty 20
  Filled 2014-02-14: qty 1

## 2014-02-14 MED ORDER — DEXTROSE 5 % IV SOLN
2.0000 g | Freq: Three times a day (TID) | INTRAVENOUS | Status: DC
  Filled 2014-02-14 (×2): qty 2

## 2014-02-14 MED ORDER — LORAZEPAM 2 MG/ML IJ SOLN
0.0000 mg | Freq: Four times a day (QID) | INTRAMUSCULAR | Status: AC
  Administered 2014-02-14: 2 mg via INTRAVENOUS
  Administered 2014-02-14 – 2014-02-15 (×3): 1 mg via INTRAVENOUS
  Filled 2014-02-14 (×5): qty 1

## 2014-02-14 MED ORDER — PNEUMOCOCCAL VAC POLYVALENT 25 MCG/0.5ML IJ INJ
0.5000 mL | INJECTION | INTRAMUSCULAR | Status: AC
  Administered 2014-02-15: 0.5 mL via INTRAMUSCULAR
  Filled 2014-02-14: qty 0.5

## 2014-02-14 MED ORDER — DIPHENHYDRAMINE HCL 25 MG PO CAPS
50.0000 mg | ORAL_CAPSULE | Freq: Four times a day (QID) | ORAL | Status: DC | PRN
  Administered 2014-02-15: 50 mg via ORAL
  Filled 2014-02-14: qty 2

## 2014-02-14 MED ORDER — SODIUM CHLORIDE 0.9 % IJ SOLN
3.0000 mL | Freq: Two times a day (BID) | INTRAMUSCULAR | Status: DC
  Administered 2014-02-14 – 2014-02-18 (×7): 3 mL via INTRAVENOUS

## 2014-02-14 MED ORDER — ALBUMIN HUMAN 25 % IV SOLN
100.0000 mL | Freq: Once | INTRAVENOUS | Status: AC
  Administered 2014-02-14: 100 mL via INTRAVENOUS
  Filled 2014-02-14: qty 100

## 2014-02-14 MED ORDER — THIAMINE HCL 100 MG/ML IJ SOLN
100.0000 mg | Freq: Every day | INTRAMUSCULAR | Status: DC
  Filled 2014-02-14 (×6): qty 1

## 2014-02-14 MED ORDER — FOLIC ACID 1 MG PO TABS
1.0000 mg | ORAL_TABLET | Freq: Every day | ORAL | Status: DC
  Administered 2014-02-14 – 2014-02-19 (×5): 1 mg via ORAL
  Filled 2014-02-14 (×6): qty 1

## 2014-02-14 MED ORDER — ACETAMINOPHEN 325 MG PO TABS
650.0000 mg | ORAL_TABLET | Freq: Once | ORAL | Status: AC
  Administered 2014-02-14: 650 mg via ORAL
  Filled 2014-02-14: qty 2

## 2014-02-14 MED ORDER — NAPHAZOLINE HCL 0.1 % OP SOLN: 1.0000 [drp] | Freq: Four times a day (QID) | OPHTHALMIC | Status: DC | PRN

## 2014-02-14 MED ORDER — VITAMIN B-1 100 MG PO TABS
100.0000 mg | ORAL_TABLET | Freq: Every day | ORAL | Status: DC
  Administered 2014-02-14 – 2014-02-19 (×6): 100 mg via ORAL
  Filled 2014-02-14 (×6): qty 1

## 2014-02-14 MED ORDER — SODIUM CHLORIDE 0.9 % IV SOLN
INTRAVENOUS | Status: DC
  Administered 2014-02-14 – 2014-02-19 (×5): via INTRAVENOUS
  Filled 2014-02-14 (×12): qty 1000

## 2014-02-14 MED ORDER — INFLUENZA VAC SPLIT QUAD 0.5 ML IM SUSY
0.5000 mL | PREFILLED_SYRINGE | INTRAMUSCULAR | Status: AC
  Administered 2014-02-15: 0.5 mL via INTRAMUSCULAR
  Filled 2014-02-14: qty 0.5

## 2014-02-14 MED ORDER — PANTOPRAZOLE SODIUM 40 MG IV SOLR
40.0000 mg | Freq: Two times a day (BID) | INTRAVENOUS | Status: DC
  Administered 2014-02-14 – 2014-02-16 (×5): 40 mg via INTRAVENOUS
  Filled 2014-02-14 (×6): qty 40

## 2014-02-14 MED ORDER — ONDANSETRON HCL 4 MG/2ML IJ SOLN
4.0000 mg | Freq: Four times a day (QID) | INTRAMUSCULAR | Status: DC | PRN
  Administered 2014-02-14: 4 mg via INTRAVENOUS
  Filled 2014-02-14: qty 2

## 2014-02-14 MED ORDER — SERTRALINE HCL 50 MG PO TABS
150.0000 mg | ORAL_TABLET | Freq: Every day | ORAL | Status: DC
  Administered 2014-02-14 – 2014-02-19 (×6): 150 mg via ORAL
  Filled 2014-02-14 (×6): qty 1

## 2014-02-14 MED ORDER — ONDANSETRON HCL 4 MG PO TABS: 4.0000 mg | ORAL_TABLET | Freq: Four times a day (QID) | ORAL | Status: DC | PRN

## 2014-02-14 MED ORDER — HYDROMORPHONE HCL 1 MG/ML IJ SOLN
1.0000 mg | INTRAMUSCULAR | Status: DC | PRN
  Administered 2014-02-14: 1 mg via INTRAVENOUS
  Filled 2014-02-14: qty 1

## 2014-02-14 MED ORDER — SODIUM CHLORIDE 0.9 % IV SOLN
80.0000 mg | Freq: Once | INTRAVENOUS | Status: AC
  Administered 2014-02-14: 80 mg via INTRAVENOUS
  Filled 2014-02-14: qty 80

## 2014-02-14 MED ORDER — MORPHINE SULFATE 2 MG/ML IJ SOLN
1.0000 mg | INTRAMUSCULAR | Status: DC | PRN
  Administered 2014-02-15 – 2014-02-16 (×2): 1 mg via INTRAVENOUS
  Filled 2014-02-14 (×2): qty 1

## 2014-02-14 MED ORDER — DIPHENHYDRAMINE HCL 25 MG PO CAPS
25.0000 mg | ORAL_CAPSULE | Freq: Once | ORAL | Status: AC
  Administered 2014-02-14: 25 mg via ORAL
  Filled 2014-02-14: qty 1

## 2014-02-14 MED ORDER — HYDROMORPHONE HCL 1 MG/ML IJ SOLN
1.0000 mg | Freq: Once | INTRAMUSCULAR | Status: AC
  Administered 2014-02-14: 1 mg via INTRAVENOUS
  Filled 2014-02-14: qty 1

## 2014-02-14 MED ORDER — VANCOMYCIN HCL IN DEXTROSE 1-5 GM/200ML-% IV SOLN
1000.0000 mg | Freq: Once | INTRAVENOUS | Status: AC
  Administered 2014-02-14: 1000 mg via INTRAVENOUS
  Filled 2014-02-14: qty 200

## 2014-02-14 MED ORDER — INSULIN ASPART 100 UNIT/ML ~~LOC~~ SOLN
0.0000 [IU] | Freq: Three times a day (TID) | SUBCUTANEOUS | Status: DC
  Administered 2014-02-14 (×2): 5 [IU] via SUBCUTANEOUS

## 2014-02-14 MED ORDER — VANCOMYCIN HCL IN DEXTROSE 750-5 MG/150ML-% IV SOLN
750.0000 mg | Freq: Two times a day (BID) | INTRAVENOUS | Status: DC
  Administered 2014-02-15 – 2014-02-16 (×3): 750 mg via INTRAVENOUS
  Filled 2014-02-14 (×5): qty 150

## 2014-02-14 MED ORDER — SODIUM CHLORIDE 0.9 % IV SOLN: Freq: Once | INTRAVENOUS | Status: DC

## 2014-02-14 MED ORDER — LORAZEPAM 2 MG/ML IJ SOLN
0.0000 mg | Freq: Two times a day (BID) | INTRAMUSCULAR | Status: AC
  Administered 2014-02-16: 2 mg via INTRAVENOUS
  Filled 2014-02-14 (×2): qty 1

## 2014-02-14 MED ORDER — LORAZEPAM 1 MG PO TABS: 1.0000 mg | ORAL_TABLET | Freq: Four times a day (QID) | ORAL | Status: AC | PRN

## 2014-02-14 NOTE — Progress Notes (Signed)
ANTIBIOTIC CONSULT NOTE - INITIAL  Pharmacy Consult for Vancomycin Indication: rule out sepsis  Allergies  Allergen Reactions  . Betadine [Povidone Iodine] Hives    Patient Measurements:  Weight: 55.2kg Ht: 157cm  Vital Signs: Temp: 97.8 F (36.6 C) (10/26 0637) Temp Source: Oral (10/26 8299) BP: 137/96 mmHg (10/26 3716) Pulse Rate: 109 (10/26 0900) Intake/Output from previous day: 10/25 0701 - 10/26 0700 In: 120 [I.V.:120] Out: -  Intake/Output from this shift:    Labs:  Recent Labs  02/13/14 2108 02/14/14 0740  WBC 2.1* 1.4*  HGB 14.0 12.9  PLT 37* 26*  CREATININE 0.36* 0.34*   The CrCl is unknown because both a height and weight (above a minimum accepted value) are required for this calculation. No results found for this basename: VANCOTROUGH, VANCOPEAK, VANCORANDOM, GENTTROUGH, GENTPEAK, GENTRANDOM, TOBRATROUGH, TOBRAPEAK, TOBRARND, AMIKACINPEAK, AMIKACINTROU, AMIKACIN,  in the last 72 hours   Microbiology: No results found for this or any previous visit (from the past 720 hour(s)).  Medical History: Past Medical History  Diagnosis Date  . Impaired glucose tolerance 11/07/2010  . Hepatitis C approx dx 2005    no tx to date  . Pancreatitis     HX of  . Alcohol dependency     at least [place] admissions 2007- 09/2013 for detox.   Marland Kitchen GERD (gastroesophageal reflux disease)   . Chronic constipation   . Gallstones   . Drug dependency     Hx of cocaine use  . Anxiety and depression 08/2011  . Rheumatoid factor positive   . Hemochromatosis     last phlebotomy tx ~ 2012. liver bx 2006  . Pancytopenia   . Abnormal vaginal bleeding     uterine fibroid  . Gallstone 2006  . Chronic abdominal pain 2011  . Cirrhosis 11/09/2010  . Type II or unspecified type diabetes mellitus without mention of complication, uncontrolled 09/17/2011    Medications:  Prescriptions prior to admission  Medication Sig Dispense Refill  . diphenhydrAMINE (BENADRYL) 25 MG tablet Take 50  mg by mouth every 6 (six) hours as needed for sleep.      Marland Kitchen esomeprazole (NEXIUM) 40 MG capsule Take 40 mg by mouth daily as needed (heartburn).      Marland Kitchen ibuprofen (ADVIL,MOTRIN) 200 MG tablet Take 200 mg by mouth every 6 (six) hours as needed (pain).      . insulin aspart (NOVOLOG) 100 UNIT/ML injection Inject 15 Units into the skin 3 (three) times daily before meals.       . insulin glargine (LANTUS) 100 UNIT/ML injection Inject 15 Units into the skin at bedtime. @@ 10 pm      . linagliptin (TRADJENTA) 5 MG TABS tablet Take 1 tablet (5 mg total) by mouth daily. For diabetes  30 tablet    . metFORMIN (GLUCOPHAGE) 500 MG tablet Take 500 mg by mouth 2 (two) times daily with a meal.      . Multiple Vitamin (MULTIVITAMIN WITH MINERALS) TABS tablet Take 1 tablet by mouth daily.      . sertraline (ZOLOFT) 100 MG tablet Take 150 mg by mouth daily.      Marland Kitchen tetrahydrozoline (VISINE) 0.05 % ophthalmic solution Place 1 drop into both eyes every morning.       Assessment: [name] to start Vancomycin for r/o sepsis. Patient has already been placed on Ceftriaxone 2g daily for possible SBP. Patient is currently afebrile but WBC have decreased to 1.4. - CrCl >60 ml/min (difficult to estimate with low body  weight and low SCr 0.34)  Goal of Therapy:  Vancomycin trough level 15-20 mcg/ml  Plan:  1. Vancomycin 1g IV x 1, then 750mg  IV q12h 2. Continue Ceftriaxone 2g IV daily 3. Monitor renal function, cultures, clinical course and order Vancomycin trough at [facility] [phone] 02/14/2014,10:06 AM

## 2014-02-14 NOTE — Progress Notes (Signed)
New Admission Note:   Arrival Method: from ED via stretcher Mental Orientation: Alert and Oriented x4 Telemetry: Placed on box 6E09 Assessment: Completed Skin: Intact, warm, and dry IV: Left AC and Right AC Peripheral IV Pain: 4/10 abd pain Tubes: N/A Safety Measures: Educated on fall prevention safety plan, patient acknowledged and understood. Admission: Completed [address] [place] Orientation: Patient has been orientated to the room, unit and staff.  Family:N/A  Orders have been reviewed and implemented. Will continue to monitor the patient. Call light has been placed within reach and bed alarm has been activated.    Dorothea Glassman, RN  Phone number: [phone]

## 2014-02-14 NOTE — Progress Notes (Addendum)
Results for MILAN, CLARE (MRN [id number]) as of 02/14/2014 08:44  Ref. Range 02/14/2014 07:40 02/14/2014 07:51  WBC Latest Range: 4.0-10.5 K/uL 1.4 (LL)   Results for DENIQUA, PERRY (MRN [id number]) as of 02/14/2014 08:44  Ref. Range 02/14/2014 07:40  Platelets Latest Range: 150-400 K/uL 26 (LL)   MD  Made aware

## 2014-02-14 NOTE — Consult Note (Addendum)
[facility] Consult: 9:30 AM 02/14/2014  LOS: 1 day    Referring Provider: Dr Grandville Silos  Primary Care Physician:  Cathlean Cower, MD Primary Gastroenterologist:  Dr. Deatra Ina.     Reason for Consultation: Abdominal pain, hematochezia, hematemesis.    HPI: [name] is a 48 y.o. female.  Type 2 DM treated with insulin and oral agents.  Cirrhotic, refractory alcoholic with Hepatitis C and hemachromatosis (last phlebotomy 2012, liver biopsy 2006: chronic hepatitis C, inflammatory score 10 of 16, fibrosis score 4-5 of 6, early micronodular formation, hemosiderin deposition 0-1 on scale of 0 to 4.).  S/p 11/2011 hysterectomy for fibroid with dysfunctional vaginal bleeding.  Thrombocytopenia but no coagulopathy.  Chronic abdominal pain.    EGD for hematemesis 11/2103 by Dr Fuller Plan: MWT and non bleeding gastric ulcers.  Previous EGDs in 2006 nd 2010 for workup of abdominal pain showed gastritis (2006), or were normal (2011).  Takes Nexium daily, Ibuprofen 400 mg daily for back and abdominal pain.   Admitted early this AM.  Diffuse abdominal pain progressing over last 2 weeks.  Pain not unusual in its location, but it seems worse than usual.   Vomiting up red blood for previous 5 to 6 mornings.  Hematochezia for several days, 2 to 3 episodes per day.  Drinks about 1 pint of vodka 3 to 4 times per week, proud that she does not drink every day.  Husband drinks with pt.  In ED tachy to low 100s. BPs to 109/ 70s.  Labs notable for WBCs 2.1.  Platelets to 37.  Lactic acid to 2.3  LFTs elevated a bit more than usual, transaminase pattern c/w etoh hepatitis.  Stool is FOBT +.  ETOH level and tox screen not obtained but she did attest to snorting cocaine on 10/24.  CT shows cholelithiasis, ascites in RUQ  HIDA scan in progress.  Paracentesis is planned if adequate ascites on ultrasound.  Do not see that she ever had previous paracentesis.  Empirically started on Rocephin.    Past Medical History  Diagnosis Date  . Impaired glucose tolerance 11/07/2010  . Hepatitis C approx dx 2005    no tx to date  . Pancreatitis     HX of  . Alcohol dependency     at least [facility] admissions 2007- 09/2013 for detox.   Marland Kitchen GERD (gastroesophageal reflux disease)   . Chronic constipation   . Gallstones   . Drug dependency     Hx of cocaine use  . Anxiety and depression 08/2011  . Rheumatoid factor positive   . Hemochromatosis     last phlebotomy tx ~ 2012. liver bx 2006  . Pancytopenia   . Abnormal vaginal bleeding     uterine fibroid  . Gallstone 2006  . Chronic abdominal pain 2011  . Cirrhosis 11/09/2010  . Type II or unspecified type diabetes mellitus without mention of complication, uncontrolled 09/17/2011    Past Surgical History  Procedure Laterality Date  . Appendectomy    . Cesarean section  x 2  . Foot surgery  2013  . Abdominal hysterectomy  12/17/2011    Procedure: HYSTERECTOMY ABDOMINAL;  Surgeon: Gus Height, MD;  Location: [facility];  Service: Gynecology;  Laterality: N/A;  . Salpingoophorectomy  12/17/2011    Procedure: SALPINGO OOPHERECTOMY;  Surgeon: Gus Height, MD;  Location: [facility];  Service: Gynecology;  Laterality: Bilateral;  . Esophagogastroduodenoscopy N/A 12/10/2013    Procedure: ESOPHAGOGASTRODUODENOSCOPY (EGD);  Surgeon: Ladene Artist, MD;  Location: [facility];  Service: Endoscopy;  Laterality: N/A;    Prior to Admission medications   Medication Sig Start Date End Date Taking? Authorizing Provider  diphenhydrAMINE (BENADRYL) 25 MG tablet Take 50 mg by mouth every 6 (six) hours as needed for sleep.   Yes Historical Provider, MD  esomeprazole (NEXIUM) 40 MG capsule Take 40 mg by mouth daily as needed (heartburn).   Yes Historical Provider, MD  ibuprofen (ADVIL,MOTRIN) 200 MG tablet Take 200 mg by  mouth every 6 (six) hours as needed (pain).   Yes Historical Provider, MD  insulin aspart (NOVOLOG) 100 UNIT/ML injection Inject 15 Units into the skin 3 (three) times daily before meals.    Yes Historical Provider, MD  insulin glargine (LANTUS) 100 UNIT/ML injection Inject 15 Units into the skin at bedtime. @@ 10 pm   Yes Historical Provider, MD  linagliptin (TRADJENTA) 5 MG TABS tablet Take 1 tablet (5 mg total) by mouth daily. For diabetes 09/28/13  Yes Encarnacion Slates, NP  metFORMIN (GLUCOPHAGE) 500 MG tablet Take 500 mg by mouth 2 (two) times daily with a meal.   Yes Historical Provider, MD  Multiple Vitamin (MULTIVITAMIN WITH MINERALS) TABS tablet Take 1 tablet by mouth daily.   Yes Historical Provider, MD  sertraline (ZOLOFT) 100 MG tablet Take 150 mg by mouth daily.   Yes Historical Provider, MD  tetrahydrozoline (VISINE) 0.05 % ophthalmic solution Place 1 drop into both eyes every morning.   Yes Historical Provider, MD    Scheduled Meds: . albumin human  100 mL Intravenous Once  . [START ON 02/15/2014] cefTRIAXone (ROCEPHIN)  IV  2 g Intravenous Q24H  . folic acid  1 mg Oral Daily  . [START ON 02/15/2014] Influenza vac split quadrivalent PF  0.5 mL Intramuscular Tomorrow-1000  . insulin aspart  0-9 Units Subcutaneous TID WC  . insulin glargine  10 Units Subcutaneous QHS  . lidocaine-EPINEPHrine  20 mL Infiltration Once  . LORazepam  0-4 mg Intravenous 4 times per day   Followed by  . [START ON 02/16/2014] LORazepam  0-4 mg Intravenous Q12H  . multivitamin with minerals  1 tablet Oral Daily  . pantoprazole (PROTONIX) IV  80 mg Intravenous Once  . pantoprazole (PROTONIX) IV  40 mg Intravenous Q12H  . [START ON 02/15/2014] pneumococcal 23 valent vaccine  0.5 mL Intramuscular Tomorrow-1000  . sertraline  150 mg Oral Daily  . sodium chloride  3 mL Intravenous Q12H  . thiamine  100 mg Oral Daily   Or  . thiamine  100 mg Intravenous Daily   Infusions: . sodium chloride 100 mL/hr at  02/14/14 0705   PRN Meds: diphenhydrAMINE, HYDROmorphone (DILAUDID) injection, LORazepam, LORazepam, morphine injection, naphazoline, ondansetron (ZOFRAN) IV, ondansetron   Allergies as of 02/13/2014 - Review Complete 02/13/2014  Allergen Reaction Noted  . Betadine [povidone iodine] Hives 12/24/2013    Family History  Problem Relation Age of Onset  . Cancer Mother     ovarian  . Diabetes Father     History   Social History  . Marital Status: Married  Spouse Name: N/A    Number of Children: 3  . Years of Education: N/A   Occupational History  . formed [employer]     now staying home   Social History Main Topics  . Smoking status: Former Smoker    Types: Cigarettes    Quit date: 12/09/2010  . Smokeless tobacco: Never Used  . Alcohol Use: Yes     Comment: "drink every other day"  . Drug Use: Yes     Comment: pt reports doing "line of cocaine"  yesterday 02-12-14.  Does not do drugs otherwise  . Sexual Activity: Not Currently    Birth Control/ Protection: None   Other Topics Concern  . Longest sobriety: 9 years.  2 admissions to [facility] and other [facility] admissions for etoh and  Related depression.  DWI 1994, driving with revoked license [id number].    Social History Narrative  . Lives with her husband, who also drinks ETOH.  They live on his disability check.  She has been trying to procure SSI disability due to  "bipolar" and other psych disorders.     REVIEW OF SYSTEMS: Constitutional:  10 # weight loss in 2 to 3 months.  ENT:  No nose bleeds.  Does have spontaneous bleeding from tongue after she brushes it.  Pulm:  No cough or sob.  CV:  No palpitations, no LE edema.  GU:  No hematuria, no frequency GI:  Per HPI.  No dysphagia Heme:  Bruises easily but no large hematomas   Transfusions: in 11/2011 Neuro:  No headaches, no peripheral tingling or numbness Derm:  No itching, no rash or sores.  Endocrine:  No sweats or chills.  No polyuria or  dysuria Immunization:  Not queried.  Travel:  None beyond local counties in last few months.    PHYSICAL EXAM: Vital signs in last 24 hours: Filed Vitals:   02/14/14 0900  BP:   Pulse: 109  Temp:   Resp:    Wt Readings from Last 3 Encounters:  12/08/13 122 lb (55.339 kg)  12/08/13 122 lb (55.339 kg)  09/23/13 132 lb (59.875 kg)   General: tanned, unwell, chronically ill appearing WF who looks old for age.  Comfortable at rest Head:  No asymmetry or swelling.   Eyes:  No icterus or pallor Ears:  Not HOH.    Nose:  No discharge Mouth:  Clear, moist.  Neck:  No mass, no TMG Lungs:  Clear bil.  Hoarse voice.  Heart: RRR.  No MRG Abdomen:  Soft, diffuse tenderness without guard or rebound. No mass, unable to palapate spleen tip.   Rectal: scant amount of stool, is FOBT +.  No fresh blood, no masses.  External hemorrhoidal tags   Musc/Skeltl: no joint contracture or swelling.  Extremities:  No pedal edema.   Neurologic:  Oriented x 3.  No aterixis, + UE tremor.  Skin:  No rash or sores.  No telangectasia.  Some bruising residue on buttocks and back.  Tattoos:  none Nodes:  No cervical adenopathy.    Psych:  Flat, depressed affect.   LAB RESULTS:  Recent Labs  02/13/14 2108 02/14/14 0740  WBC 2.1* 1.4*  HGB 14.0 12.9  HCT 40.7 38.0  PLT 37* 26*   BMET Lab Results  Component Value Date   NA 137 02/14/2014   NA 137 02/13/2014   NA 141 12/24/2013   K 3.3* 02/14/2014   K 3.9 02/13/2014   K 4.0 12/24/2013   CL  94* 02/14/2014   CL 94* 02/13/2014   CL 102 12/24/2013   CO2 23 02/14/2014   CO2 28 02/13/2014   CO2 24 12/24/2013   GLUCOSE 293* 02/14/2014   GLUCOSE 297* 02/13/2014   GLUCOSE 274* 12/24/2013   BUN 7 02/14/2014   BUN 6 02/13/2014   BUN 8 12/24/2013   CREATININE 0.34* 02/14/2014   CREATININE 0.36* 02/13/2014   CREATININE 0.54 12/24/2013   CALCIUM 8.3* 02/14/2014   CALCIUM 8.9 02/13/2014   CALCIUM 10.0 12/24/2013   LFT  Recent Labs  02/13/14 2108  02/14/14 0740  PROT 8.2 7.5  ALBUMIN 3.4* 3.1*  AST 205* 164*  ALT 117* 106*  ALKPHOS 165* 144*  BILITOT 1.6* 1.4*   PT/INR Lab Results  Component Value Date   INR 1.16 02/13/2014   INR 1.14 12/08/2013   INR 1.09 12/17/2011   Lipase     Component Value Date/Time   LIPASE 31 02/14/2014 0740    Drugs of Abuse     Component Value Date/Time   LABOPIA NONE DETECTED 12/24/2013 2115   LABOPIA NEGATIVE 01/04/2007 0935   COCAINSCRNUR NONE DETECTED 12/24/2013 2115   COCAINSCRNUR NEGATIVE 01/04/2007 0935   LABBENZ NONE DETECTED 12/24/2013 2115   LABBENZ POSITIVE Sent for confirmatory testing* 01/04/2007 0935   AMPHETMU NONE DETECTED 12/24/2013 2115   AMPHETMU NEGATIVE 01/04/2007 0935   THCU NONE DETECTED 12/24/2013 2115   LABBARB NONE DETECTED 12/24/2013 2115     RADIOLOGY STUDIES: Dg Chest Port 1 View  02/14/2014   CLINICAL DATA:  Mid chest pain, cough.  EXAM: PORTABLE CHEST - 1 VIEW  COMPARISON:  12/24/2013  FINDINGS: No confluent airspace opacity, pleural effusion, or pneumothorax. Cardiomediastinal contours are within normal range. No acute osseous finding.  IMPRESSION: No radiographic evidence of active cardiopulmonary disease.   Electronically Signed   By: Carlos Levering M.D.   On: 02/14/2014 02:45   US Abdomen Limited Ruq  02/14/2014   CLINICAL DATA:  Initial valuation for cirrhosis. No wound gallstones.  EXAM: US ABDOMEN LIMITED - RIGHT UPPER QUADRANT  COMPARISON:  Prior study from 12/24/2013  FINDINGS: Gallbladder:  Several mobile echogenic stones measuring up to 2.8 mm present within the gallbladder lumen. No gallbladder wall thickening. No sonographic Murphy sign elicited on exam.  Common bile duct:  Diameter: 5.4 mm.  Liver:  Liver demonstrates a coarse echogenic echotexture with nodular contour, consistent with cirrhosis. No focal intrahepatic lesions. Ascites present within the right upper quadrant.  IMPRESSION: 1. Cholelithiasis without sonographic evidence for acute cholecystitis. 2.  Hepatic cirrhosis. 3. Ascites.   Electronically Signed   By: Jeannine Boga M.D.   On: 02/14/2014 04:22    ENDOSCOPIC STUDIES: 11/2013 EGD by Dr Fuller Plan.   For hematemesis ENDOSCOPIC IMPRESSION:  1. Mallory-Weiss tear at the gastroesophageal junction  2. Three linear, medium sized, non-bleeding ulcers in the gastric  body  3. Diffuse gastritis vs gastropathy  RECOMMENDATIONS:  1. Avoid NSAIDS  2. Continue PPI  06/2009  EGD  By Dr Deatra Ina: for abdominal pain.  Normal  06/2004 EGD  Dr Carlean Purl for abdominal pain. Mild erosive gastritis    IMPRESSION:   *  GIB with hematemesis, rectal bleeding EGD in 11/2013: MWT and gastric ulcers.  No varices or portal  HTN. Pt says she has been compliant with daily Nexium.  No previous colonoscopy.   *  Cirrhosis, hepatitis C/ETOH/hemachromatosis.  Virus never treated.  LFTs chronically elevated.  Currently with acute alcoholic hepatitis. Hepatitis discriminant function score is 65  *  Chronic abdominal pain.  Workups in past unrevealing. Ascites on ultrasound today.  Paracentesis with fluid studies ordered but not yet completed.  Started on empiric Rocephin.    *  Cholelithiasis.  Evaluated in setting of chronic right sided abdominal pain by gen surgery 11/2013.  Did not feel she had cholecystitis.  Hx alcoholic pancreatitis but currently Lipase is 31.   *  Alcoholism, chronic/refractory.   *  DM. Type 2 on insulin and oral agents at home.   *  Leukopenia.  *  Thrombocytopenia, chronic but currently lower than previous low of 37. . PT/INR normal. Splenomegaly on CT 12/24/13.  *  Major depression.      PLAN:     *  EGD, could arrange for tomorrow but would prefer platelets to improve before undertaking this.  Colonoscopy?.  Will d/w case with Dr Carlean Purl.  Not sure she can prep just yet.  And may be best to do EGD first to see what we are dealing with.  Keep on BID IV Protonix for now. ? Add prednisolone?   *  Await HIDA scan reading  and completion of paracentesis.    *  Can have sips of clears if she tolerates.   *  Hematology has been consulted.    [name]  02/14/2014, 9:30 AM Pager: [phone]     [facility] Attending  I have also seen and assessed the patient and agree with the above note.  I think we may not learn much from an EGD - just had one in August and does not seem to have bled much. Main issues would be stopping alcohol and taking a PPI. Rectal bleeding deserves some eval - flex sig most likely - would bet she has rectal varices. She appears to have alcoholic hepatitis and abdominal wall pain causing abd pain, I think.   She needs to get through next 1-2 d w/o DT's and will see about endoscopic evaluation Would not act on low GB EF - she is not a candidate for a cholecystectomy in this setting Advance to full liquids  Will follow  Gatha Mayer, MD, [facility] [phone] (pager) 02/14/2014 4:42 PM

## 2014-02-14 NOTE — Progress Notes (Signed)
Patient ID: [name], female   DOB: [birth year], 48 y.o.   MRN: [id number] Pt presented to [place] dept today for paracentesis. On limited US abd in all four quadrants there is no sig ascites noted amenable to tap . Procedure was cancelled. Pt notified.

## 2014-02-14 NOTE — Progress Notes (Signed)
[facility] PROGRESS NOTE  [name] LOV:[id number] DOB: Feb 10, 1966 DOA: 02/13/2014 PCP: Cathlean Cower, MD  Assessment/Plan: #1 abdominal pain/probable spontaneous bacterial peritonitis Patient with history of cirrhosis, ongoing alcohol abuse, history of hepatitis C presenting with a 5 day history of worsening diffuse abdominal pain. Patient's abdomen is exquisitely tender to palpation. Patient also noted to have a neutropenia with white count trending down now at 1.4. Patient also noted to be tachycardic. Paracentesis attempted by ED physician however was unsuccessful. Patient has been pancultured. Hida scan pending. Will order a diagnostic and therapeutic paracentesis per interventional radiology. Patient has an placed on IV Rocephin. Will consult with GI for further evaluation and management.  #2 sepsis Likely secondary to problem #1. Patient has been pancultured. Diagnostic paracentesis pending. Patient is currently on IV Rocephin. Will broaden coverage and add IV vancomycin.  #3 thrombocytopenia Likely secondary to chronic alcohol abuse. Patient was some complaints of rectal bleeding and noted to have some hematemesis in the room. Will follow H&H. If continued bleeding may need to be transfused a pack of platelets.  #4 GI bleed/rectal bleeding and hematemesis Patient with prior EGD done 8 2015 that showed a Mallory-Weiss tear. Patient also noted to have some rectal bleeding per patient. Hemoglobin currently at 12.9 from 14.0 on admission. Follow H&H. PPI every 12 hours. Serial CBCs. Will consult with GI for further evaluation and management. Nothing by mouth.  #5 neutropenia Patient has been pancultured. Will added this to CBC does has been obtained. Continue them. IV antibiotics. Diagnostic paracentesis pending. Will broaden antibiotic coverage to IV vancomycin and Rocephin. Follow.  #6 history of ongoing alcohol abuse Alcohol cessation. Continue the Ativan CIWA withdrawal  protocol. Thiamine. Folic acid.  #7 Hypokalemia Replete.  #8 transaminitis Likely secondary to history of cirrhosis and hepatitis C. Follow.  #9 DMII CBGs have ranged from 71-292. Continue Lantus. Sliding scale insulin.  #10 major depressive disorder Stable. Continue Zoloft.  #11 prophylaxis PPI for GI prophylaxis. SCDs for DVT prophylaxis.   Code Status: Full Family Communication: Updated patient no family at bedside. Disposition Plan: Transfer to stepdown unit.   Consultants:  None  Procedures:  Chest x-ray 02/14/2014  Antibiotics:  IV Rocephin 02/14/2014  IV vancomycin 02/15/2014  HPI/Subjective: Patient still complaining of diffuse severe abdominal pain which had worsened and ongoing for the past 5 days. Patient also complains of rectal bleeding. Patient noted to have hematemesis at the bedside.  Objective: Filed Vitals:   02/14/14 0900  BP:   Pulse: 109  Temp:   Resp:     Intake/Output Summary (Last 24 hours) at 02/14/14 0915 Last data filed at 02/14/14 0405  Gross per 24 hour  Intake    120 ml  Output      0 ml  Net    120 ml   There were no vitals filed for this visit.  Exam:   General: Laying in bed in the fetal position.  Cardiovascular: Tachycardic regular rhythm  Respiratory: Clear to auscultation bilaterally.  Abdomen: Soft, exquisitely tender to palpation diffusely, decreased breath sounds, nondistended,  Musculoskeletal: No clubbing cyanosis or edema  Data Reviewed: Basic Metabolic Panel:  Recent Labs Lab 02/13/14 2108 02/14/14 0740  NA 137 137  K 3.9 3.3*  CL 94* 94*  CO2 28 23  GLUCOSE 297* 293*  BUN 6 7  CREATININE 0.36* 0.34*  CALCIUM 8.9 8.3*   Liver Function Tests:  Recent Labs Lab 02/13/14 2108 02/14/14 0740  AST 205* 164*  ALT 117*  106*  ALKPHOS 165* 144*  BILITOT 1.6* 1.4*  PROT 8.2 7.5  ALBUMIN 3.4* 3.1*    Recent Labs Lab 02/14/14 0740  LIPASE 31   No results found for this basename:  AMMONIA,  in the last 168 hours CBC:  Recent Labs Lab 02/13/14 2108 02/14/14 0740  WBC 2.1* 1.4*  NEUTROABS 0.8*  --   HGB 14.0 12.9  HCT 40.7 38.0  MCV 102.5* 103.0*  PLT 37* 26*   Cardiac Enzymes: No results found for this basename: CKTOTAL, CKMB, CKMBINDEX, TROPONINI,  in the last 168 hours BNP (last 3 results) No results found for this basename: PROBNP,  in the last 8760 hours CBG:  Recent Labs Lab 02/14/14 0751  GLUCAP 292*    No results found for this or any previous visit (from the past 240 hour(s)).   Studies: Dg Chest Port 1 View  02/14/2014   CLINICAL DATA:  Mid chest pain, cough.  EXAM: PORTABLE CHEST - 1 VIEW  COMPARISON:  12/24/2013  FINDINGS: No confluent airspace opacity, pleural effusion, or pneumothorax. Cardiomediastinal contours are within normal range. No acute osseous finding.  IMPRESSION: No radiographic evidence of active cardiopulmonary disease.   Electronically Signed   By: Carlos Levering M.D.   On: 02/14/2014 02:45   US Abdomen Limited Ruq  02/14/2014   CLINICAL DATA:  Initial valuation for cirrhosis. No wound gallstones.  EXAM: US ABDOMEN LIMITED - RIGHT UPPER QUADRANT  COMPARISON:  Prior study from 12/24/2013  FINDINGS: Gallbladder:  Several mobile echogenic stones measuring up to 2.8 mm present within the gallbladder lumen. No gallbladder wall thickening. No sonographic Murphy sign elicited on exam.  Common bile duct:  Diameter: 5.4 mm.  Liver:  Liver demonstrates a coarse echogenic echotexture with nodular contour, consistent with cirrhosis. No focal intrahepatic lesions. Ascites present within the right upper quadrant.  IMPRESSION: 1. Cholelithiasis without sonographic evidence for acute cholecystitis. 2. Hepatic cirrhosis. 3. Ascites.   Electronically Signed   By: Jeannine Boga M.D.   On: 02/14/2014 04:22    Scheduled Meds: . albumin human  100 mL Intravenous Once  . [START ON 02/15/2014] cefTRIAXone (ROCEPHIN)  IV  2 g Intravenous Q24H   . folic acid  1 mg Oral Daily  . [START ON 02/15/2014] Influenza vac split quadrivalent PF  0.5 mL Intramuscular Tomorrow-1000  . insulin aspart  0-9 Units Subcutaneous TID WC  . insulin glargine  10 Units Subcutaneous QHS  . lidocaine-EPINEPHrine  20 mL Infiltration Once  . LORazepam  0-4 mg Intravenous 4 times per day   Followed by  . [START ON 02/16/2014] LORazepam  0-4 mg Intravenous Q12H  . multivitamin with minerals  1 tablet Oral Daily  . pantoprazole (PROTONIX) IV  80 mg Intravenous Once  . pantoprazole (PROTONIX) IV  40 mg Intravenous Q12H  . [START ON 02/15/2014] pneumococcal 23 valent vaccine  0.5 mL Intramuscular Tomorrow-1000  . sertraline  150 mg Oral Daily  . sodium chloride  3 mL Intravenous Q12H  . thiamine  100 mg Oral Daily   Or  . thiamine  100 mg Intravenous Daily   Continuous Infusions: . sodium chloride 100 mL/hr at 02/14/14 0705    Principal Problem:   SBP (spontaneous bacterial peritonitis) Active Problems:   Cirrhosis   DM (diabetes mellitus), type 2   Alcohol dependence   Cocaine abuse   MDD (major depressive disorder)   Hematemesis   GIB (gastrointestinal bleeding)   Abdominal pain   Thrombocytopenia   Hypokalemia  Time spent: 40 minutes    [facility] MD [facility] Pager [phone] If 7PM-7AM, please contact night-coverage at [email or web address], password [facility] 02/14/2014, 9:15 AM  LOS: 1 day

## 2014-02-14 NOTE — H&P (Signed)
[facility] History and Physical  [name] ERX:[id number] DOB: 12-27-1965 DOA: 02/13/2014  Referring physician: ED physician PCP: Cathlean Cower, MD  Specialists:   Chief Complaint: abdominal pain, rectal bleeding and vaginal bleeding.  HPI: [name] is a 48 y.o. female the past medical history of cirrhosis secondary to hepatitis C, hysterectomy, alcohol abuse, diabetes type 2, SBP, who presents with  abdominal pain, rectal bleeding and vaginal bleeding.  Patient reports that she has been having chronic abdominal pain. In the past 2 weeks, abdominal pain has been progressively getting worse. She has nausea, and vomited almost every morning. No blood in the vomitus. She denies diarrhea. She feels hot, does not have fever or chills.  She also reports that she noticed bright red colored blood from her rectum and also small amount of blood from vagina. She denies any vaginal discharge. She does not have cough, chest pain, shortness of breath, leg edema. No hematuria.  She is found to have tachycardia. Lactate 2.3. WBC 2.1. Elevated transaminases and total bilirubin. CT-abd showed cholelithiasis without sonographic evidence for acute Patient is admitted to inpatient for further evaluation to treatment.  Review of Systems: As presented in the history of presenting illness, rest negative.  Where does patient live? Lives with her husband at home.   Can patient participate in ADLs? Yes  Allergy:  Allergies  Allergen Reactions  . Betadine [Povidone Iodine] Hives    Past Medical History  Diagnosis Date  . Impaired glucose tolerance 11/07/2010  . Hepatitis C approx dx 2005    no tx to date  . Pancreatitis     HX of  . Alcohol dependency     at least [place] admissions 2007- 09/2013 for detox.   Marland Kitchen GERD (gastroesophageal reflux disease)   . Chronic constipation   . Gallstones   . Drug dependency     Hx of cocaine use  . Anxiety and depression 08/2011  . Rheumatoid factor  positive   . Hemochromatosis     last phlebotomy tx ~ 2012. liver bx 2006  . Pancytopenia   . Abnormal vaginal bleeding     uterine fibroid  . Gallstone 2006  . Chronic abdominal pain 2011  . Cirrhosis 11/09/2010  . Type II or unspecified type diabetes mellitus without mention of complication, uncontrolled 09/17/2011    Past Surgical History  Procedure Laterality Date  . Appendectomy    . Cesarean section  x 2  . Foot surgery  2013  . Abdominal hysterectomy  12/17/2011    Procedure: HYSTERECTOMY ABDOMINAL;  Surgeon: Gus Height, MD;  Location: [facility];  Service: Gynecology;  Laterality: N/A;  . Salpingoophorectomy  12/17/2011    Procedure: SALPINGO OOPHERECTOMY;  Surgeon: Gus Height, MD;  Location: [facility];  Service: Gynecology;  Laterality: Bilateral;  . Esophagogastroduodenoscopy N/A 12/10/2013    Procedure: ESOPHAGOGASTRODUODENOSCOPY (EGD);  Surgeon: Ladene Artist, MD;  Location: [facility];  Service: Endoscopy;  Laterality: N/A;    Social History:  reports that she quit smoking about 3 years ago. Her smoking use included Cigarettes. She smoked 0.00 packs per day. She has never used smokeless tobacco. She reports that she drinks alcohol. She reports that she uses illicit drugs.  Family History:  Family History  Problem Relation Age of Onset  . Cancer Mother     ovarian  . Diabetes Father      Prior to Admission medications   Medication Sig Start Date End Date Taking? Authorizing Provider  diphenhydrAMINE (BENADRYL)  25 MG tablet Take 50 mg by mouth every 6 (six) hours as needed for sleep.   Yes Historical Provider, MD  esomeprazole (NEXIUM) 40 MG capsule Take 40 mg by mouth daily as needed (heartburn).   Yes Historical Provider, MD  ibuprofen (ADVIL,MOTRIN) 200 MG tablet Take 200 mg by mouth every 6 (six) hours as needed (pain).   Yes Historical Provider, MD  insulin aspart (NOVOLOG) 100 UNIT/ML injection Inject 15 Units into the skin 3 (three) times daily before meals.    Yes  Historical Provider, MD  insulin glargine (LANTUS) 100 UNIT/ML injection Inject 15 Units into the skin at bedtime. @@ 10 pm   Yes Historical Provider, MD  linagliptin (TRADJENTA) 5 MG TABS tablet Take 1 tablet (5 mg total) by mouth daily. For diabetes 09/28/13  Yes Encarnacion Slates, NP  metFORMIN (GLUCOPHAGE) 500 MG tablet Take 500 mg by mouth 2 (two) times daily with a meal.   Yes Historical Provider, MD  Multiple Vitamin (MULTIVITAMIN WITH MINERALS) TABS tablet Take 1 tablet by mouth daily.   Yes Historical Provider, MD  sertraline (ZOLOFT) 100 MG tablet Take 150 mg by mouth daily.   Yes Historical Provider, MD  tetrahydrozoline (VISINE) 0.05 % ophthalmic solution Place 1 drop into both eyes every morning.   Yes Historical Provider, MD    Physical Exam: Filed Vitals:   02/14/14 0200 02/14/14 0330 02/14/14 0345 02/14/14 0400  BP: 125/84 108/71 106/72 113/96  Pulse: 117 107 109 110  Temp:      TempSrc:      Resp: 17 19 18 18   SpO2: 94% 86% 85% 83%   General: in acute distress due to pain HEENT:       Eyes: PERRL, EOMI, with scleral icterus       ENT: No discharge from the ears and nose, no pharynx injection, no tonsillar enlargement.        Neck: No JVD, no bruit, no mass felt. Cardiac: S1/S2, RRR, No murmurs, gallops or rubs Pulm: Good air movement bilaterally. Clear to auscultation bilaterally. No rales, wheezing, rhonchi or rubs. Abd: diffused tender and guarding, BS present Ext: No edema. 2+DP/PT pulse bilaterally Musculoskeletal: No joint deformities, erythema, or stiffness, ROM full Skin: No rashes.  Neuro: Alert and oriented X3, cranial nerves II-XII grossly intact, muscle strength 5/5 in all extremeties, sensation to light touch intact.  Psych: Patient is not psychotic, no suicidal or hemocidal ideation.  Labs on Admission:  Basic Metabolic Panel:  Recent Labs Lab 02/13/14 2108  NA 137  K 3.9  CL 94*  CO2 28  GLUCOSE 297*  BUN 6  CREATININE 0.36*  CALCIUM 8.9    Liver Function Tests:  Recent Labs Lab 02/13/14 2108  AST 205*  ALT 117*  ALKPHOS 165*  BILITOT 1.6*  PROT 8.2  ALBUMIN 3.4*   No results found for this basename: LIPASE, AMYLASE,  in the last 168 hours No results found for this basename: AMMONIA,  in the last 168 hours CBC:  Recent Labs Lab 02/13/14 2108  WBC 2.1*  NEUTROABS 0.8*  HGB 14.0  HCT 40.7  MCV 102.5*  PLT 37*   Cardiac Enzymes: No results found for this basename: CKTOTAL, CKMB, CKMBINDEX, TROPONINI,  in the last 168 hours  BNP (last 3 results) No results found for this basename: PROBNP,  in the last 8760 hours CBG: No results found for this basename: GLUCAP,  in the last 168 hours  Radiological Exams on Admission: Dg Chest Port 1  View  02/14/2014   CLINICAL DATA:  Mid chest pain, cough.  EXAM: PORTABLE CHEST - 1 VIEW  COMPARISON:  12/24/2013  FINDINGS: No confluent airspace opacity, pleural effusion, or pneumothorax. Cardiomediastinal contours are within normal range. No acute osseous finding.  IMPRESSION: No radiographic evidence of active cardiopulmonary disease.   Electronically Signed   By: Carlos Levering M.D.   On: 02/14/2014 02:45   US Abdomen Limited Ruq  02/14/2014   CLINICAL DATA:  Initial valuation for cirrhosis. No wound gallstones.  EXAM: US ABDOMEN LIMITED - RIGHT UPPER QUADRANT  COMPARISON:  Prior study from 12/24/2013  FINDINGS: Gallbladder:  Several mobile echogenic stones measuring up to 2.8 mm present within the gallbladder lumen. No gallbladder wall thickening. No sonographic Murphy sign elicited on exam.  Common bile duct:  Diameter: 5.4 mm.  Liver:  Liver demonstrates a coarse echogenic echotexture with nodular contour, consistent with cirrhosis. No focal intrahepatic lesions. Ascites present within the right upper quadrant.  IMPRESSION: 1. Cholelithiasis without sonographic evidence for acute cholecystitis. 2. Hepatic cirrhosis. 3. Ascites.   Electronically Signed   By: Jeannine Boga M.D.   On: 02/14/2014 04:22    EKG: Independently reviewed.   Assessment/Plan Principal Problem:   SBP (spontaneous bacterial peritonitis) Active Problems:   Cirrhosis   DM (diabetes mellitus), type 2   Alcohol dependence   Cocaine abuse   GIB (gastrointestinal bleeding)  Abdominal pain: Patient presents with the severe abdominal pain. She has sepsis. Likely due to SBP. Other differential diagnoses include pancreatitis, alcoholic gastritis and cholecystitis. CT abdomen did not show cholecystitis, however this does not rule out the possibility. Dr. Kathrynn Humble tried bed side paracentesis without success due to lack of pocket of ascites.  -will admit to tele bed - Start empiric treatment for SBP: IV Cefotaxime and IV albumin - get HIDA  -blood culture - check lipase - Protonix 40 mg IV - pain control  DM-II: Last A1c was 9.5 on 12/08/13. On Lantus and oral medications at home. -Decreas Lantus dose from 15 units to 10 units daily - ssi - hold oral meds  Alcohol abuse: Still drinking alcohol, last drink was yesterday. -CIWA protocol  GIB: has bright red colored rectal bleeding. Hemoglobin stable. Hemoglobin is 14 on admission.  -check cbc q12  - if Hgb drops sharply, call GIB, otherwisie, follow up with GI as outpt  Vaginal bleeding: This is questionable. The patient does not have any discharge from vagina. She has history of hysterectomy. It is likely due to contamination from rectal bleeding. Will observe closely, If she continues to have vaginal bleeding, consider other workups such as GC/chlamydia probe.   DVT ppx: SCD due to GIB and thrombocytopenia.  Code Status: Full code Family Communication: None at bed side.  Disposition Plan: Admit to inpatient   Date of Service 02/14/2014    Ivor Costa [facility] Pager [phone]  If 7PM-7AM, please contact night-coverage [email or web address] Password [email or web address] 02/14/2014, 5:10 AM

## 2014-02-14 NOTE — ED Provider Notes (Signed)
Patient signed out to me as pending right upper quadrant ultrasound to evaluate for cholecystitis. Patient already received IV ceftriaxone for SBP ultrasound does not reveal any cholecystitis or need for surgical intervention. [facility] to [facility] who accepted the patient to [facility].  Everlene Balls, MD 02/14/14 [phone]

## 2014-02-14 NOTE — Progress Notes (Signed)
Attempted report x1. Return call awaiting.

## 2014-02-14 NOTE — ED Notes (Signed)
US at bedside

## 2014-02-14 NOTE — Consult Note (Signed)
[place]  Telephone:([phone]) [place] NOTE (Attending Note at bottom)  [name]                                MR#: [id number]  DOB: 09/06/1965                       CSN#: [id number]  Patient Care Team: Biagio Borg, MD as PCP - General Referring MD: [facility]    Reason for Consult: Thrombocytopenia  [name] 48 y.o. white  female with multiple medical issues listed below, including  DM, Hep C+, ETOH habituation,never treated ETOH cirrhosis and hemochromatosis per liver biopsy in 2006, with last phlebotomy in 2012 asked to see in consultation for evaluation of thrombocytopenia. Patient is a poor historian, does not elaborate on answers. She was admitted on 10/25 with progressive, 2 week history of abdominal pain with increased girth, nausea, non-bloody emesis, rectal and possibly recurrent vaginal bleeding (she had Menometrorrhagia secondary to uterine fibroids s/p TAH BSO in 12/16/11). No respiratory or cardiac complaints. She denied any hematuria. She had subjective fevers without chills or night sweats.  No mental status changes or seizure activity.On admission,she was tachycardic.  She was found to have abnormal CBC with a WBC of 2.1, ANC 0.8, normal Hb at 14, MCV of 102.5 and platelets of 37,000. She had elevated transaminases and total bilirubin. Renal functions were normal. CT of the abdomen and pelvis was remarkable for cholelithiasis, non-acute and known advanced cirrhosis.She also had ascites, which is a new finding compared to a CT from 12/2013. HIDA scan in progress as per GI service  Other labs include AFP of 8.9 (in the setting of cirrhosis, ascites). Smear has been ordered for review. DIC is pending. Hemoccult positive. HIV is pending; today's CBC shows a WBC of 1.4 (no diff available), Hb at 12.9 and platelets dropped at 26,000 in the setting of acute bleeding. PT/INR are 15/1.16, PTT 28 She states never had a  hematologist evaluation, cannot provide information on name of physician directing her past phlebotomies. She has never had a bone marrow biopsy. She denies prior platelet transfusions. Denies any ticks or other insect bites. Patient admits to bruising easily for many years.The patient denies risk factors for HIV. No recent Lovenox or other heparin products. Denies any  history of cardiac murmur or prior cardiovascular surgery. Denies recent new medications. She does take NSAIDs for back and abdominal pain. She is currently on Vancomycin and Rocephin, as well as IV Protonix.  We were kindly asked to see the patient with recommendations  PMH:  Past Medical History  Diagnosis Date  . Impaired glucose tolerance 11/07/2010  . Hepatitis C approx dx 2005    no tx to date  . Pancreatitis     HX of  . Alcohol dependency     at least [facility] admissions 2007- 09/2013 for detox.   Marland Kitchen GERD (gastroesophageal reflux disease)   . Chronic constipation   . Gallstones   . Drug dependency     Hx of cocaine use  . Anxiety and depression 08/2011  . Rheumatoid factor positive   . Hemochromatosis     last phlebotomy tx ~ 2012. liver bx 2006  . Pancytopenia   . Abnormal vaginal bleeding     uterine fibroid  . Gallstone 2006  . Chronic abdominal pain  2011  . Cirrhosis 11/09/2010  . Type II or unspecified type diabetes mellitus without mention of complication, uncontrolled 09/17/2011   Pertinent Gynecological History:  Menses: flow is excessive with use of 8 pads or tampons on heaviest days  Bleeding: dysfunctional uterine bleeding  Contraception: tubal ligation  DES exposure: denies  Blood transfusions: has received transfusion due to heavy bleeding  Sexually transmitted diseases: no past history  Last pap: normal Date: 11/28/2011  OB History: G2 P3003  Menstrual History:  Patient's last menstrual period was 11/01/2011.  Surgeries:  Past Surgical History  Procedure Laterality Date  . Appendectomy    .  Cesarean section  x 2  . Foot surgery  2013  . Abdominal hysterectomy  12/17/2011    Procedure: HYSTERECTOMY ABDOMINAL;  Surgeon: Gus Height, MD;  Location: [facility];  Service: Gynecology;  Laterality: N/A;  . Salpingoophorectomy  12/17/2011    Procedure: SALPINGO OOPHERECTOMY;  Surgeon: Gus Height, MD;  Location: [facility];  Service: Gynecology;  Laterality: Bilateral;  . Esophagogastroduodenoscopy N/A 12/10/2013    Procedure: ESOPHAGOGASTRODUODENOSCOPY (EGD);  Surgeon: Ladene Artist, MD;  Location: [facility];  Service: Endoscopy;  Laterality: N/A;    Allergies:  Allergies  Allergen Reactions  . Betadine [Povidone Iodine] Hives    Medications:    prior to admission:  Prescriptions prior to admission  Medication Sig Dispense Refill  . diphenhydrAMINE (BENADRYL) 25 MG tablet Take 50 mg by mouth every 6 (six) hours as needed for sleep.      Marland Kitchen esomeprazole (NEXIUM) 40 MG capsule Take 40 mg by mouth daily as needed (heartburn).      Marland Kitchen ibuprofen (ADVIL,MOTRIN) 200 MG tablet Take 200 mg by mouth every 6 (six) hours as needed (pain).      . insulin aspart (NOVOLOG) 100 UNIT/ML injection Inject 15 Units into the skin 3 (three) times daily before meals.       . insulin glargine (LANTUS) 100 UNIT/ML injection Inject 15 Units into the skin at bedtime. @@ 10 pm      . linagliptin (TRADJENTA) 5 MG TABS tablet Take 1 tablet (5 mg total) by mouth daily. For diabetes  30 tablet    . metFORMIN (GLUCOPHAGE) 500 MG tablet Take 500 mg by mouth 2 (two) times daily with a meal.      . Multiple Vitamin (MULTIVITAMIN WITH MINERALS) TABS tablet Take 1 tablet by mouth daily.      . sertraline (ZOLOFT) 100 MG tablet Take 150 mg by mouth daily.      Marland Kitchen tetrahydrozoline (VISINE) 0.05 % ophthalmic solution Place 1 drop into both eyes every morning.        Derrill Memo ON 02/15/2014] cefTRIAXone (ROCEPHIN)  IV  2 g Intravenous Q24H  . folic acid  1 mg Oral Daily  . [START ON 02/15/2014] Influenza vac split quadrivalent  PF  0.5 mL Intramuscular Tomorrow-1000  . insulin aspart  0-9 Units Subcutaneous TID WC  . insulin glargine  10 Units Subcutaneous QHS  . lidocaine-EPINEPHrine  20 mL Infiltration Once  . LORazepam  0-4 mg Intravenous 4 times per day   Followed by  . [START ON 02/16/2014] LORazepam  0-4 mg Intravenous Q12H  . multivitamin with minerals  1 tablet Oral Daily  . pantoprazole (PROTONIX) IV  80 mg Intravenous Once  . pantoprazole (PROTONIX) IV  40 mg Intravenous Q12H  . [START ON 02/15/2014] pneumococcal 23 valent vaccine  0.5 mL Intramuscular Tomorrow-1000  . sertraline  150 mg Oral Daily  .  sodium chloride  3 mL Intravenous Q12H  . thiamine  100 mg Oral Daily   Or  . thiamine  100 mg Intravenous Daily  . vancomycin  1,000 mg Intravenous Once  . [START ON 02/15/2014] vancomycin  750 mg Intravenous Q12H    OEU:[id number], HYDROmorphone (DILAUDID) injection, LORazepam, LORazepam, morphine injection, naphazoline, ondansetron (ZOFRAN) IV, ondansetron  ROS: see HPI for significant positives, rest of ROS is negative.    Family History:    Family History  Problem Relation Age of Onset  . Cancer Mother     ovarian  . Diabetes Father   1 aunt with stomach cancer  No family history of hematological  disorders.  Social History:  reports that she quit smoking about 3 years ago. Her smoking use included Cigarettes. She smoked 0.00 packs per day. She has never used smokeless tobacco. She reports that she drinks alcohol "3 Bloody Mary's a day". She reports that she uses illicit drugs "I used 1 time only  cocaine". Lives in [place]. 3 children.   Physical Exam    ECOG PERFORMANCE STATUS:  Filed Vitals:   02/14/14 0900  BP:   Pulse: 109  Temp:   Resp:    There were no vitals filed for this visit.  GENERAL:alert, no distress and very uncomfortable due to pain and nausea. Very lethargic. SKIN: skin color mildly jaundiced, no rashes or significant lesions EYES: normal,  conjunctiva are pink and non-injected, sclera icteric. OROPHARYNX:no exudate, no erythema and lips, buccal mucosa, and tongue normal  NECK: supple, thyroid normal size, non-tender, without nodularity LYMPH:  no palpable lymphadenopathy in the cervical, axillary or inguinal area LUNGS: clear to auscultation and percussion with normal breathing effort HEART: regular rate & rhythm and no murmurs and no lower extremity edema ABDOMEN:abdomen distended, tender with light palpation, ascites noted.  Musculoskeletal:no cyanosis of digits and no clubbing  PSYCH: alert & oriented x 3 with fluent speech NEURO: no focal motor/sensory deficits   Labs:    Recent Labs Lab 02/13/14 2108 02/14/14 0740  WBC 2.1* 1.4*  HGB 14.0 12.9  HCT 40.7 38.0  PLT 37* 26*  MCV 102.5* 103.0*  MCH 35.3* 35.0*  MCHC 34.4 33.9  RDW 14.1 14.1  LYMPHSABS 1.1  --   MONOABS 0.2  --   EOSABS 0.0  --   BASOSABS 0.0  --         Recent Labs Lab 02/13/14 2108 02/14/14 0740  NA 137 137  K 3.9 3.3*  CL 94* 94*  CO2 28 23  GLUCOSE 297* 293*  BUN 6 7  CREATININE 0.36* 0.34*  CALCIUM 8.9 8.3*  AST 205* 164*  ALT 117* 106*  ALKPHOS 165* 144*  BILITOT 1.6* 1.4*        Component Value Date/Time   BILITOT 1.4* 02/14/2014 0740   BILIDIR 0.4* 04/13/2013 1153   IBILI 0.5 01/08/2007 0900      Recent Labs Lab 02/13/14 2108  INR 1.16    No results found for this basename: DDIMER,  in the last 72 hours   Anemia panel:  No results found for this basename: VITAMINB12, FOLATE, FERRITIN, TIBC, IRON, RETICCTPCT,  in the last 72 hours  Urinalysis    Component Value Date/Time   COLORURINE YELLOW 02/13/2014 2114   [place] 02/13/2014 2114   LABSPEC 1.011 02/13/2014 2114   PHURINE 6.5 02/13/2014 2114   GLUCOSEU >1000* 02/13/2014 2114   GLUCOSEU 100* 04/13/2013 1153   HGBUR LARGE* 02/13/2014 2114   BILIRUBINUR  NEGATIVE 02/13/2014 2114   BILIRUBINUR neg 05/19/2013 0855   KETONESUR NEGATIVE  02/13/2014 2114   PROTEINUR NEGATIVE 02/13/2014 2114   PROTEINUR neg 05/19/2013 0855   UROBILINOGEN 0.2 02/13/2014 2114   UROBILINOGEN 0.2 05/19/2013 0855   NITRITE NEGATIVE 02/13/2014 2114   NITRITE neg 05/19/2013 0855   LEUKOCYTESUR NEGATIVE 02/13/2014 2114    Drugs of Abuse     Component Value Date/Time   LABOPIA NONE DETECTED 12/24/2013 2115   LABOPIA NEGATIVE 01/04/2007 0935   COCAINSCRNUR NONE DETECTED 12/24/2013 2115   COCAINSCRNUR NEGATIVE 01/04/2007 0935   LABBENZ NONE DETECTED 12/24/2013 2115   LABBENZ POSITIVE Sent for confirmatory testing* 01/04/2007 0935   AMPHETMU NONE DETECTED 12/24/2013 2115   AMPHETMU NEGATIVE 01/04/2007 0935   THCU NONE DETECTED 12/24/2013 2115   LABBARB NONE DETECTED 12/24/2013 2115    Pt 15.6, ptt 29 inr 1.22  Fibrinogen 251 d-dimer 1.08  Peripheral smear: No schistocytes seen   HIV negative, RPR negative, ESR 37  Imaging Studies:  Dg Chest Port 1 View  02/14/2014   CLINICAL DATA:  Mid chest pain, cough.  EXAM: PORTABLE CHEST - 1 VIEW  COMPARISON:  12/24/2013  FINDINGS: No confluent airspace opacity, pleural effusion, or pneumothorax. Cardiomediastinal contours are within normal range. No acute osseous finding.  IMPRESSION: No radiographic evidence of active cardiopulmonary disease.   Electronically Signed   By: Carlos Levering M.D.   On: 02/14/2014 02:45   US Abdomen Limited Ruq  02/14/2014   CLINICAL DATA:  Initial valuation for cirrhosis. No wound gallstones.  EXAM: US ABDOMEN LIMITED - RIGHT UPPER QUADRANT  COMPARISON:  Prior study from 12/24/2013  FINDINGS: Gallbladder:  Several mobile echogenic stones measuring up to 2.8 mm present within the gallbladder lumen. No gallbladder wall thickening. No sonographic Murphy sign elicited on exam.  Common bile duct:  Diameter: 5.4 mm.  Liver:  Liver demonstrates a coarse echogenic echotexture with nodular contour, consistent with cirrhosis. No focal intrahepatic lesions. Ascites present within the right upper  quadrant.  IMPRESSION: 1. Cholelithiasis without sonographic evidence for acute cholecystitis. 2. Hepatic cirrhosis. 3. Ascites.   Electronically Signed   By: Jeannine Boga M.D.   On: 02/14/2014 04:22   CT ABDOMEN PELVIS W CONTRAST 12/24/2013  No acute findings within the abdomen or pelvis.  Hepatic cirrhosis. Mild splenomegaly (13-14 CM) , likely due to portal venous  hypertension. No evidence of ascites.  Cholelithiasis. No radiographic evidence of cholecystitis.     A/P: 48 y.o. female with :   Thrombocytopenia   Bleeding issues:   At this point the cause is unknown, but likely acute on chronic liver disease complicated with rectal bleeding. She reports vaginal bleeding, although patient had a hysterectomy and BSO in 2013, making Gyn etiology of bleeding less likely. Would consider CA 125 in view of family history of ovarian cancer.  Thrombocytopenia is likely secondary alcohol. It has both a direct inhibitory effect on the bone marrow and could also be a sequale of alcohol-induced liver disease with splenomegaly. Last documented size of Spleen is 14 cm on the scan.  Agree with transfusion of 1 unit of platelets. Splenomegaly was seen on CT in 12/2013.  No Lovenox if platelet count is at 15,000   Transfuse 1 unit of therapeutic platelets transfusion prior to any surgery, given higher risk of bleeding procedure with possible acquired plt dysfunction due to above, to maintain platelet count greater than 70k   Check DIC, HIT panel, LDH. Smear available for review. Last ANA  on 2009 was negative, ruling out autoimmune disease.   Check platelets daily using citrate tube. Check Vitamin B12. HIV negative.  She may need a Bone Marrow biopsy to rule out MDS or if refractory disease versus observation if asymptomatic and platelets are greater than 30k or less than 20k if bleeding.  Leukopenia Likely acute, due to current illness and antibiotics. No intervention at this time, will follow.    GI bleed with Hematemesis Cirrhosis, Hep C+, history of alcoholic pancreatitis History of Hemochromatosis with phlebotomies, last in 2012.  ETOH and Cocaine abuse New abdominal ascites. Appreciate GI following Possible paracentesis if this worsens.  EGD is being entertained after HIDA scan results. Polysubstance cessation recommended.   Full Code   **Disclaimer: This note was dictated with voice recognition software. Similar sounding words can inadvertently be transcribed and this note may contain transcription errors which may not have been corrected upon publication of note.Sharene Butters E, PA-C 02/14/2014 10:24 AM  Attending Note:  I agree with above note and I have seen and examined patient and made some edits in note above. CBC count trend is as follows:     The platelet graph is as follows:    Etiology of low platelets is 1. Alcoholism (drink hard liquor) causes megakaryocytic inhibition in bone marrow. 2. 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 3. No DIC. 4. No TTP. 5. Unlikely HIT 6. Unlikely a medication effect or a primary bone marrow failure syndrome. 7. Unlikely ITP. 8. Hepatitis C can contribute also by direct marrow suppression. 9. Motrin or other NSAIDS can also cause low platelet and more importantly affect platelet function.  Once she stops alcohol or cut back on alcohol, marrow function will recover and platelets/white count will improve, may not normalize. She understands that by drinking she poses multiple risks to her life, one of them being Bleeding complications.  Fresh platelets can be used for any elective procedure or if platelets are below 20,000 or if there is any active mucocutaneous bleeding. One can also use DDAVP or desmopressin infusion to help platelet function and decrease risk for bleeding. DDAVP dose is 0.3 mcg/kg by slow infusion; may repeat dose if needed (based on clinical response and laboratory results); if  used preoperatively, administer 30 minutes before procedure  We will follow her counts while she is inpatient.  Bernadene Bell, MD Medical Hematologist/Oncologist [place] Pager: [phone] Office No: [phone]

## 2014-02-15 DIAGNOSIS — K920 Hematemesis: Secondary | ICD-10-CM

## 2014-02-15 DIAGNOSIS — R1011 Right upper quadrant pain: Secondary | ICD-10-CM

## 2014-02-15 DIAGNOSIS — R110 Nausea: Secondary | ICD-10-CM

## 2014-02-15 LAB — COMPREHENSIVE METABOLIC PANEL
ALT: 82 U/L — AB (ref 0–35)
AST: 128 U/L — ABNORMAL HIGH (ref 0–37)
Albumin: 3.3 g/dL — ABNORMAL LOW (ref 3.5–5.2)
Alkaline Phosphatase: 133 U/L — ABNORMAL HIGH (ref 39–117)
Anion gap: 14 (ref 5–15)
BUN: 7 mg/dL (ref 6–23)
CO2: 29 mEq/L (ref 19–32)
Calcium: 9 mg/dL (ref 8.4–10.5)
Chloride: 91 mEq/L — ABNORMAL LOW (ref 96–112)
Creatinine, Ser: 0.4 mg/dL — ABNORMAL LOW (ref 0.50–1.10)
GFR calc Af Amer: >90 mL/min (ref >90)
GFR calc non Af Amer: >90 mL/min (ref >90)
Glucose, Bld: 276 mg/dL — ABNORMAL HIGH (ref 70–99)
POTASSIUM: 3.5 meq/L — AB (ref 3.7–5.3)
SODIUM: 134 meq/L — AB (ref 137–147)
Total Bilirubin: 1.5 mg/dL — ABNORMAL HIGH (ref 0.3–1.2)
Total Protein: 7.3 g/dL (ref 6.0–8.3)

## 2014-02-15 LAB — CBC WITH DIFFERENTIAL/PLATELET
Basophils Absolute: 0 10*3/uL (ref 0.0–0.1)
Basophils Relative: 1 % (ref 0–1)
EOS ABS: 0 10*3/uL (ref 0.0–0.7)
EOS PCT: 2 % (ref 0–5)
HEMATOCRIT: 37.2 % (ref 36.0–46.0)
HEMOGLOBIN: 12.5 g/dL (ref 12.0–15.0)
LYMPHS ABS: 0.6 10*3/uL — AB (ref 0.7–4.0)
Lymphocytes Relative: 30 % (ref 12–46)
MCH: 35.4 pg — ABNORMAL HIGH (ref 26.0–34.0)
MCHC: 33.6 g/dL (ref 30.0–36.0)
MCV: 105.4 fL — AB (ref 78.0–100.0)
MONO ABS: 0.2 10*3/uL (ref 0.1–1.0)
MONOS PCT: 10 % (ref 3–12)
NEUTROS PCT: 58 % (ref 43–77)
Neutro Abs: 1.1 10*3/uL — ABNORMAL LOW (ref 1.7–7.7)
Platelets: 33 10*3/uL — ABNORMAL LOW (ref 150–400)
RBC: 3.53 MIL/uL — AB (ref 3.87–5.11)
RDW: 14.1 % (ref 11.5–15.5)
WBC: 2 10*3/uL — ABNORMAL LOW (ref 4.0–10.5)

## 2014-02-15 LAB — PREPARE PLATELET PHERESIS: UNIT DIVISION: 0

## 2014-02-15 LAB — HEMOGLOBIN A1C
Hgb A1c MFr Bld: 10.5 % — ABNORMAL HIGH (ref <5.7)
Mean Plasma Glucose: 255 mg/dL — ABNORMAL HIGH (ref <117)

## 2014-02-15 LAB — GLUCOSE, CAPILLARY
GLUCOSE-CAPILLARY: 182 mg/dL — AB (ref 70–99)
Glucose-Capillary: 265 mg/dL — ABNORMAL HIGH (ref 70–99)
Glucose-Capillary: 322 mg/dL — ABNORMAL HIGH (ref 70–99)
Glucose-Capillary: 121 mg/dL — ABNORMAL HIGH (ref 70–99)

## 2014-02-15 LAB — MAGNESIUM: Magnesium: 1.4 mg/dL — ABNORMAL LOW (ref 1.5–2.5)

## 2014-02-15 LAB — CBC
HCT: 38 % (ref 36.0–46.0)
Hemoglobin: 12.9 g/dL (ref 12.0–15.0)
MCH: 35 pg — ABNORMAL HIGH (ref 26.0–34.0)
MCHC: 33.9 g/dL (ref 30.0–36.0)
MCV: 103 fL — ABNORMAL HIGH (ref 78.0–100.0)
PLATELETS: 26 10*3/uL — AB (ref 150–400)
RBC: 3.69 MIL/uL — AB (ref 3.87–5.11)
RDW: 14.1 % (ref 11.5–15.5)
WBC: 1.4 10*3/uL — AB (ref 4.0–10.5)

## 2014-02-15 LAB — VITAMIN B12: VITAMIN B 12: 1166 pg/mL — AB (ref 211–911)

## 2014-02-15 LAB — PATHOLOGIST SMEAR REVIEW

## 2014-02-15 MED ORDER — MAGNESIUM SULFATE 4000MG/100ML IJ SOLN
4.0000 g | Freq: Once | INTRAMUSCULAR | Status: AC
  Administered 2014-02-15: 4 g via INTRAVENOUS
  Filled 2014-02-15: qty 100

## 2014-02-15 MED ORDER — INSULIN ASPART 100 UNIT/ML ~~LOC~~ SOLN
0.0000 [IU] | Freq: Three times a day (TID) | SUBCUTANEOUS | Status: DC
  Administered 2014-02-15: 2 [IU] via SUBCUTANEOUS
  Administered 2014-02-15: 11 [IU] via SUBCUTANEOUS
  Administered 2014-02-16: 3 [IU] via SUBCUTANEOUS
  Administered 2014-02-17 (×2): 2 [IU] via SUBCUTANEOUS
  Administered 2014-02-17 – 2014-02-19 (×3): 8 [IU] via SUBCUTANEOUS
  Administered 2014-02-19: 3 [IU] via SUBCUTANEOUS

## 2014-02-15 MED ORDER — POTASSIUM CHLORIDE CRYS ER 20 MEQ PO TBCR
40.0000 meq | EXTENDED_RELEASE_TABLET | ORAL | Status: AC
  Administered 2014-02-15 (×2): 40 meq via ORAL
  Filled 2014-02-15 (×2): qty 2

## 2014-02-15 MED ORDER — INSULIN GLARGINE 100 UNIT/ML ~~LOC~~ SOLN
14.0000 [IU] | Freq: Every day | SUBCUTANEOUS | Status: DC
  Filled 2014-02-15: qty 0.14

## 2014-02-15 MED ORDER — INSULIN GLARGINE 100 UNIT/ML ~~LOC~~ SOLN
18.0000 [IU] | Freq: Every day | SUBCUTANEOUS | Status: DC
  Administered 2014-02-15 – 2014-02-18 (×4): 18 [IU] via SUBCUTANEOUS
  Filled 2014-02-15 (×5): qty 0.18

## 2014-02-15 NOTE — Progress Notes (Signed)
Daily Rounding Note  02/15/2014, 11:20 AM  LOS: 2 days   SUBJECTIVE:       No nausea at present, comes and goes.  No emesis on full liquids.  Small amount of blood seen with wiping up after brown BM.  Still with pain in lower abdomen.  Receiving Ativan for withdrawal sxs. Pulse ot low 100s.  No hypotension.  OBJECTIVE:         Vital signs in last 24 hours:    Temp:  [97.8 F (36.6 C)-98.7 F (37.1 C)] 98.2 F (36.8 C) (10/27 1000) Pulse Rate:  [89-110] 110 (10/27 1000) Resp:  [18] 18 (10/27 1000) BP: (115-131)/(67-88) 115/75 mmHg (10/27 1000) SpO2:  [90 %-97 %] 95 % (10/27 1000) Weight:  [133 lb (60.328 kg)] 133 lb (60.328 kg) (10/26 2100) Last BM Date: 02/13/14 Filed Weights   02/14/14 2100  Weight: 133 lb (60.328 kg)   General: tremulous, soft-spoken.  Looks unwell.  comfortable   Heart: RRR Chest: clear bil.  No dyspnea or cough.  Abdomen: soft, slight protuberance.  Non focal mild to moderate tenderness in lower abdomen.  No guard or rebound.  BS active  Extremities: no DDE Neuro/Psych:  Tremors of extremities.  arousable and appropriate but not accurately oriented to time. [place] with place and self. .     Lab Results:  Recent Labs  02/13/14 2108 02/14/14 0740 02/14/14 1700 02/15/14 0512  WBC 2.1* 1.4*  --  2.0*  HGB 14.0 12.9  --  12.5  HCT 40.7 38.0  --  37.2  PLT 37* 26* 23* 33*   BMET  Recent Labs  02/13/14 2108 02/14/14 0740 02/15/14 0512  NA 137 137 134*  K 3.9 3.3* 3.5*  CL 94* 94* 91*  CO2 28 23 29   GLUCOSE 297* 293* 276*  BUN 6 7 7   CREATININE 0.36* 0.34* 0.40*  CALCIUM 8.9 8.3* 9.0   LFT  Recent Labs  02/13/14 2108 02/14/14 0740 02/15/14 0512  PROT 8.2 7.5 7.3  ALBUMIN 3.4* 3.1* 3.3*  AST 205* 164* 128*  ALT 117* 106* 82*  ALKPHOS 165* 144* 133*  BILITOT 1.6* 1.4* 1.5*     Scheduled Meds: . sodium chloride   Intravenous Once  . cefTRIAXone (ROCEPHIN)  IV  2 g  Intravenous Q24H  . folic acid  1 mg Oral Daily  . insulin aspart  0-15 Units Subcutaneous TID WC  . insulin glargine  14 Units Subcutaneous QHS  . lidocaine-EPINEPHrine  20 mL Infiltration Once  . LORazepam  0-4 mg Intravenous 4 times per day   Followed by  . [START ON 02/16/2014] LORazepam  0-4 mg Intravenous Q12H  . multivitamin with minerals  1 tablet Oral Daily  . pantoprazole (PROTONIX) IV  40 mg Intravenous Q12H  . potassium chloride  40 mEq Oral Q4H  . sertraline  150 mg Oral Daily  . sodium chloride  3 mL Intravenous Q12H  . thiamine  100 mg Oral Daily   Or  . thiamine  100 mg Intravenous Daily  . vancomycin  750 mg Intravenous Q12H   Continuous Infusions: . sodium chloride 0.9 % 1,000 mL with potassium chloride 40 mEq infusion 75 mL/hr at 02/14/14 1251   PRN Meds:.diphenhydrAMINE, HYDROmorphone (DILAUDID) injection, LORazepam, LORazepam, morphine injection, naphazoline, ondansetron (ZOFRAN) IV, ondansetron  ASSESMENT:   * GIB with hematemesis.  EGD in 11/2013: MWT and gastric ulcers. No varices or portal HTN. Pt says she has  been compliant with daily Nexium.  Rectal bleeding.  ? Rectal varices. No previous colonoscopy.   * Cirrhosis, hepatitis C/ETOH/hemachromatosis. Virus never treated. LFTs chronically elevated.  Currently with acute alcoholic hepatitis. Hepatitis discriminant function score 65 10/27.   LFTs improving.   *  Ascites, not enough to tap per 10/26 PA-C assessment.  Unlikely she has SBP.   * Chronic abdominal pain. Workups in past unrevealing. Ascites on ultrasound today. Paracentesis with fluid studies ordered but not yet completed. Started on empiric Rocephin.   * Cholelithiasis. Evaluated in setting of chronic right sided abdominal pain by gen surgery 11/2013. Did not feel she had cholecystitis. Hx alcoholic pancreatitis but currently Lipase is 31.  HIDA scan with low GB EF c/w biliary dyskinesia.  Not a surgical candidate at present so no need to seek  surgical consult.   *  Hyponatremia.   * Alcoholism, chronic/refractory.   * DM. Type 2 on insulin and oral agents at home.   * Thrombocytopenia, chronic but currently lower than previously.  PT/INR normal. Splenomegaly on CT 12/24/13.  Leukopenia. Both attributed by Dr Adria Devon, oncologist, to ETOH and possibly Hep C bone marrow suppression, splenic sequestration of platelets.   * Major depression.      PLAN   *  Flex sig perhaps on 10/29.    Azucena Freed  02/15/2014, 11:20 AM Pager: [phone]  [facility] Attending  I have also seen and assessed the patient and agree with the above note. Tremulous - some withdrawal - not ideal to sedate and scope and it is not urgent. Thrombocytopenia and neutropenia are relative barriers as well No clear source of any infection - on empiric Abx at this time....probably ok with neutropenia and overall situation for now  Gatha Mayer, MD, [facility] [phone] (pager) 02/15/2014 4:32 PM

## 2014-02-15 NOTE — Progress Notes (Signed)
[facility] PROGRESS NOTE  [name] BSJ:[id number] DOB: 1966/02/10 DOA: 02/13/2014 PCP: Cathlean Cower, MD  Assessment/Plan: #1 abdominal pain/probable spontaneous bacterial peritonitis Patient with history of cirrhosis, ongoing alcohol abuse, history of hepatitis C presenting with a 5 day history of worsening diffuse abdominal pain. Patient's abdomen was exquisitely tender to palpation. Patient also noted to have a neutropenia with white count trending down now at 1.4 which has improved to 2.0 today. Patient also noted to be tachycardic. Paracentesis attempted by ED physician however was unsuccessful. Patient has been pancultured. Paracenteses unable to be done due to no significant ascites noted. Hida scan with a low EF of 18% indicating biliary dyskinesia. Continue empiric IV vancomycin IV Rocephin. GI following.   #2 sepsis Likely secondary to problem #1. Patient has been pancultured. Diagnostic paracentesis unable to be done as there is not significant ascites. Patient currently afebrile. White count trending back up. Continue empiric IV vancomycin and IV Rocephin.   #3 thrombocytopenia Likely secondary to chronic alcohol abuse. Patient was some complaints of rectal bleeding and noted to have some hematemesis yesterday. Patient status post 1 unit platelets. DIC panel negative for schistocytes doubt DIC. No evidence of TTP. Unlikely hit. Unlikely ITP. Patient has been seen by hematology/oncology recommend following. Appreciate hematology/oncology following.  #4 GI bleed/rectal bleeding and hematemesis Patient with prior EGD done 8/ 2015 that showed a Mallory-Weiss tear. Patient also noted to have some rectal bleeding per patient. Hemoglobin currently at 12.5 from 14.0 on admission. Follow H&H. PPI every 12 hours. Patient has been seen by GI  for the patient is not ideal to sedate and scope at this time due to withdrawal issues and thrombocytopenia and neutropenia. Monitor for now. GI  following.   #5 neutropenia Likely secondary to acute illness and chronic alcohol abuse. Slight improvement in counts today. Patient has been pancultured.  Paracentesis was unable to be done due to no significant ascites to tap. Continue current IV antibiotic coverage. Hematology/oncology following.   #6 history of ongoing alcohol abuse Alcohol cessation. Patient is tremulous. Continue the Ativan CIWA withdrawal protocol. Thiamine. Folic acid.  #7 Hypokalemia Replete.  #8 transaminitis Likely secondary to history of cirrhosis and hepatitis C. Follow.  #9 DMII CBGs have ranged from 121-322. Increase Lantus to 18 units daily. Sliding scale insulin.  #10 major depressive disorder Stable. Continue Zoloft.  #11 prophylaxis PPI for GI prophylaxis. SCDs for DVT prophylaxis.   Code Status: Full Family Communication: Updated patient no family at bedside. Disposition Plan: Remain inpatient.   Consultants:  Gastroenterology: Dr. Carlean Purl 02/14/2014  Hematology oncology: Dr. Lona Kettle 02/14/2014  Procedures:  Chest x-ray 02/14/2014  1 unit platelets 02/14/2014  Antibiotics:  IV Rocephin 02/14/2014  IV vancomycin 02/15/2014  HPI/Subjective: Patient still complaining of diffuse abdominal pain which had worsened and ongoing for the past 5 days prior to admission. Patient states had some rectal bleeding yesterday but none today. No hematemesis today.   Objective: Filed Vitals:   02/15/14 1000  BP: 115/75  Pulse: 110  Temp: 98.2 F (36.8 C)  Resp: 18    Intake/Output Summary (Last 24 hours) at 02/15/14 1802 Last data filed at 02/15/14 1100  Gross per 24 hour  Intake    530 ml  Output      2 ml  Net    528 ml   Filed Weights   02/14/14 2100  Weight: 60.328 kg (133 lb)    Exam:   General: Laying in bed.Tremulous.  Cardiovascular: Tachycardic regular rhythm  Respiratory: Clear to auscultation bilaterally.  Abdomen: Soft,  tender to palpation diffusely,  decreased breath sounds, nondistended,  Musculoskeletal: No clubbing cyanosis or edema  Data Reviewed: Basic Metabolic Panel:  Recent Labs Lab 02/13/14 2108 02/14/14 0740 02/15/14 0512  NA 137 137 134*  K 3.9 3.3* 3.5*  CL 94* 94* 91*  CO2 28 23 29   GLUCOSE 297* 293* 276*  BUN 6 7 7   CREATININE 0.36* 0.34* 0.40*  CALCIUM 8.9 8.3* 9.0  MG  --  1.5 1.4*   Liver Function Tests:  Recent Labs Lab 02/13/14 2108 02/14/14 0740 02/15/14 0512  AST 205* 164* 128*  ALT 117* 106* 82*  ALKPHOS 165* 144* 133*  BILITOT 1.6* 1.4* 1.5*  PROT 8.2 7.5 7.3  ALBUMIN 3.4* 3.1* 3.3*    Recent Labs Lab 02/14/14 0740  LIPASE 31   No results found for this basename: AMMONIA,  in the last 168 hours CBC:  Recent Labs Lab 02/13/14 2108 02/14/14 0740 02/14/14 1700 02/15/14 0512  WBC 2.1* 1.4*  --  2.0*  NEUTROABS 0.8* 0.8*  --  1.1*  HGB 14.0 12.9  --  12.5  HCT 40.7 38.0  --  37.2  MCV 102.5* 103.0*  --  105.4*  PLT 37* 26* 23* 33*   Cardiac Enzymes: No results found for this basename: CKTOTAL, CKMB, CKMBINDEX, TROPONINI,  in the last 168 hours BNP (last 3 results) No results found for this basename: PROBNP,  in the last 8760 hours CBG:  Recent Labs Lab 02/14/14 1658 02/14/14 2221 02/15/14 0800 02/15/14 1153 02/15/14 1658  GLUCAP 244* 293* 265* 322* 121*    Recent Results (from the past 240 hour(s))  CULTURE, BLOOD (ROUTINE X 2)     Status: None   Collection Time    02/14/14  7:20 AM      Result Value Ref Range Status   Specimen Description BLOOD RIGHT HAND   Final   Special Requests BOTTLES DRAWN AEROBIC AND ANAEROBIC 5CC   Final   Culture  Setup Time     Final   Value: 02/14/2014 11:16     Performed at [facility]   Culture     Final   Value:        BLOOD CULTURE RECEIVED NO GROWTH TO DATE CULTURE WILL BE HELD FOR 5 DAYS BEFORE ISSUING A FINAL NEGATIVE REPORT     Performed at [facility]   Report Status PENDING   Incomplete  CULTURE, BLOOD  (ROUTINE X 2)     Status: None   Collection Time    02/14/14  7:36 AM      Result Value Ref Range Status   Specimen Description BLOOD LEFT HAND   Final   Special Requests BOTTLES DRAWN AEROBIC AND ANAEROBIC 5CC   Final   Culture  Setup Time     Final   Value: 02/14/2014 11:15     Performed at [facility]   Culture     Final   Value:        BLOOD CULTURE RECEIVED NO GROWTH TO DATE CULTURE WILL BE HELD FOR 5 DAYS BEFORE ISSUING A FINAL NEGATIVE REPORT     Performed at [facility]   Report Status PENDING   Incomplete     Studies: Nm Hepatobiliary Including Gb  02/14/2014   CLINICAL DATA:  Chronic abdominal pain. History of underlying parenchymal liver disease and hepatitis-C. Known cholelithiasis.  EXAM: NUCLEAR MEDICINE HEPATOBILIARY IMAGING WITH GALLBLADDER EF  Views:  Anterior, right lateral right upper quadrant  Radionuclide:  Technetium 61m Choletec  Dose:  5.0 mCi  Route of administration: Intravenous  COMPARISON:  Ultrasound right upper quadrant February 14, 2014  FINDINGS: The liver uptake of radiotracer is within normal limits. There is prompt visualization of gallbladder and small bowel, indicating patency of the cystic and common bile ducts. A weight based dose, 1.11 mcg, of CCK was administered intravenously with calculation of the computer generated ejection fraction of radiotracer from the gallbladder. The patient did not experience clinical symptoms with the CCK administration. The computer generated ejection fraction of radiotracer from the gallbladder is abnormally low at 18%, normal greater than 38%.  IMPRESSION: Abnormally low ejection fraction of radiotracer from the gallbladder, a finding indicative of biliary dyskinesia. Cystic and common bile ducts are patent as is evidenced by visualization of gallbladder and small bowel.   Electronically Signed   By: Lowella Grip M.D.   On: 02/14/2014 15:03   US Abdomen Limited  02/14/2014   CLINICAL DATA:  Check for  possible ascites for paracentesis  EXAM: LIMITED ABDOMEN ULTRASOUND FOR ASCITES  TECHNIQUE: Limited ultrasound survey for ascites was performed in all four abdominal quadrants.  COMPARISON:  None.  FINDINGS: Limited evaluation of the abdomen in all 4 quadrants shows no evidence of significant ascites.  IMPRESSION: No evidence of significant ascites to allow for paracentesis.   Electronically Signed   By: Inez Catalina M.D.   On: 02/14/2014 15:56   Dg Chest Port 1 View  02/14/2014   CLINICAL DATA:  Mid chest pain, cough.  EXAM: PORTABLE CHEST - 1 VIEW  COMPARISON:  12/24/2013  FINDINGS: No confluent airspace opacity, pleural effusion, or pneumothorax. Cardiomediastinal contours are within normal range. No acute osseous finding.  IMPRESSION: No radiographic evidence of active cardiopulmonary disease.   Electronically Signed   By: Carlos Levering M.D.   On: 02/14/2014 02:45   US Abdomen Limited Ruq  02/14/2014   CLINICAL DATA:  Initial valuation for cirrhosis. No wound gallstones.  EXAM: US ABDOMEN LIMITED - RIGHT UPPER QUADRANT  COMPARISON:  Prior study from 12/24/2013  FINDINGS: Gallbladder:  Several mobile echogenic stones measuring up to 2.8 mm present within the gallbladder lumen. No gallbladder wall thickening. No sonographic Murphy sign elicited on exam.  Common bile duct:  Diameter: 5.4 mm.  Liver:  Liver demonstrates a coarse echogenic echotexture with nodular contour, consistent with cirrhosis. No focal intrahepatic lesions. Ascites present within the right upper quadrant.  IMPRESSION: 1. Cholelithiasis without sonographic evidence for acute cholecystitis. 2. Hepatic cirrhosis. 3. Ascites.   Electronically Signed   By: Jeannine Boga M.D.   On: 02/14/2014 04:22    Scheduled Meds: . sodium chloride   Intravenous Once  . cefTRIAXone (ROCEPHIN)  IV  2 g Intravenous Q24H  . folic acid  1 mg Oral Daily  . insulin aspart  0-15 Units Subcutaneous TID WC  . insulin glargine  18 Units  Subcutaneous QHS  . lidocaine-EPINEPHrine  20 mL Infiltration Once  . LORazepam  0-4 mg Intravenous 4 times per day   Followed by  . [START ON 02/16/2014] LORazepam  0-4 mg Intravenous Q12H  . magnesium sulfate 1 - 4 g bolus IVPB  4 g Intravenous Once  . multivitamin with minerals  1 tablet Oral Daily  . pantoprazole (PROTONIX) IV  40 mg Intravenous Q12H  . sertraline  150 mg Oral Daily  . sodium chloride  3 mL Intravenous Q12H  . thiamine  100  mg Oral Daily   Or  . thiamine  100 mg Intravenous Daily  . vancomycin  750 mg Intravenous Q12H   Continuous Infusions: . sodium chloride 0.9 % 1,000 mL with potassium chloride 40 mEq infusion 75 mL/hr at 02/14/14 1251    Principal Problem:   Abdominal pain Active Problems:   Sepsis   Cirrhosis   DM (diabetes mellitus), type 2   Alcohol dependence   Cocaine abuse   MDD (major depressive disorder)   Hematemesis   SBP (spontaneous bacterial peritonitis)   GIB (gastrointestinal bleeding)   Thrombocytopenia   Hypokalemia   Alcoholic hepatitis    Time spent: 40 minutes    [facility] MD [facility] Pager [phone] If 7PM-7AM, please contact night-coverage at [email or web address], password [email or web address] 02/15/2014, 6:02 PM  LOS: 2 days

## 2014-02-16 DIAGNOSIS — F1410 Cocaine abuse, uncomplicated: Secondary | ICD-10-CM

## 2014-02-16 LAB — COMPREHENSIVE METABOLIC PANEL
ALK PHOS: 138 U/L — AB (ref 39–117)
ALT: 82 U/L — AB (ref 0–35)
AST: 114 U/L — ABNORMAL HIGH (ref 0–37)
Albumin: 3.3 g/dL — ABNORMAL LOW (ref 3.5–5.2)
Anion gap: 12 (ref 5–15)
BUN: 5 mg/dL — ABNORMAL LOW (ref 6–23)
CO2: 31 meq/L (ref 19–32)
Calcium: 9.2 mg/dL (ref 8.4–10.5)
Chloride: 91 mEq/L — ABNORMAL LOW (ref 96–112)
Creatinine, Ser: 0.41 mg/dL — ABNORMAL LOW (ref 0.50–1.10)
GFR calc Af Amer: >90 mL/min (ref >90)
Glucose, Bld: 82 mg/dL (ref 70–99)
POTASSIUM: 3.5 meq/L — AB (ref 3.7–5.3)
SODIUM: 134 meq/L — AB (ref 137–147)
TOTAL PROTEIN: 7.7 g/dL (ref 6.0–8.3)
Total Bilirubin: 1.5 mg/dL — ABNORMAL HIGH (ref 0.3–1.2)

## 2014-02-16 LAB — GLUCOSE, CAPILLARY
GLUCOSE-CAPILLARY: 80 mg/dL (ref 70–99)
GLUCOSE-CAPILLARY: 233 mg/dL — AB (ref 70–99)
Glucose-Capillary: 67 mg/dL — ABNORMAL LOW (ref 70–99)
Glucose-Capillary: 154 mg/dL — ABNORMAL HIGH (ref 70–99)
Glucose-Capillary: 158 mg/dL — ABNORMAL HIGH (ref 70–99)

## 2014-02-16 LAB — CBC WITH DIFFERENTIAL/PLATELET
BASOS PCT: 0 % (ref 0–1)
Basophils Absolute: 0 10*3/uL (ref 0.0–0.1)
Eosinophils Absolute: 0 10*3/uL (ref 0.0–0.7)
Eosinophils Relative: 1 % (ref 0–5)
HEMATOCRIT: 42.2 % (ref 36.0–46.0)
HEMOGLOBIN: 13.9 g/dL (ref 12.0–15.0)
LYMPHS ABS: 0.6 10*3/uL — AB (ref 0.7–4.0)
Lymphocytes Relative: 20 % (ref 12–46)
MCH: 35.5 pg — ABNORMAL HIGH (ref 26.0–34.0)
MCHC: 32.9 g/dL (ref 30.0–36.0)
MCV: 107.7 fL — ABNORMAL HIGH (ref 78.0–100.0)
MONOS PCT: 11 % (ref 3–12)
Monocytes Absolute: 0.3 10*3/uL (ref 0.1–1.0)
NEUTROS ABS: 1.9 10*3/uL (ref 1.7–7.7)
Neutrophils Relative %: 67 % (ref 43–77)
Platelets: 35 10*3/uL — ABNORMAL LOW (ref 150–400)
RBC: 3.92 MIL/uL (ref 3.87–5.11)
RDW: 14.1 % (ref 11.5–15.5)
WBC: 2.8 10*3/uL — ABNORMAL LOW (ref 4.0–10.5)

## 2014-02-16 LAB — MAGNESIUM: Magnesium: 1.9 mg/dL (ref 1.5–2.5)

## 2014-02-16 MED ORDER — PANTOPRAZOLE SODIUM 40 MG PO TBEC
40.0000 mg | DELAYED_RELEASE_TABLET | Freq: Every day | ORAL | Status: DC
  Administered 2014-02-16 – 2014-02-19 (×4): 40 mg via ORAL
  Filled 2014-02-16 (×4): qty 1

## 2014-02-16 NOTE — Progress Notes (Signed)
Daily Rounding Note  02/16/2014, 11:20 AM  LOS: 3 days   SUBJECTIVE:       Agitation and DTs treated with Ativan overnight.  Periodic Dilaudid for c/o abdominal and back pain. No nausea.  Ate 85% of her AM tray.  No BMs.   OBJECTIVE:         Vital signs in last 24 hours:    Temp:  [97.8 F (36.6 C)-98.1 F (36.7 C)] 98 F (36.7 C) (10/28 0918) Pulse Rate:  [104-113] 104 (10/28 0918) Resp:  [18-22] 20 (10/28 0918) BP: (106-126)/(78-90) 108/78 mmHg (10/28 0918) SpO2:  [90 %-95 %] 93 % (10/28 0918) Weight:  [124 lb 12.8 oz (56.609 kg)] 124 lb 12.8 oz (56.609 kg) (10/27 2054) Last BM Date: 02/13/14 Filed Weights   02/14/14 2100 02/15/14 2054  Weight: 133 lb (60.328 kg) 124 lb 12.8 oz (56.609 kg)   General: tremulous, somnolent, comfortable in sleep and when awake.  Heart: RRR Chest: clear bil Abdomen: soft, NT, distended.   BS hypoactive.   Extremities: no CCE Neuro/Psych:  Tremulous, difficult to maintain arousal.  Sedated   Intake/Output from previous day: 10/27 0701 - 10/28 0700 In: 553 [I.V.:3; IV Piggyback:550] Out: 2 [Urine:1; Stool:1]  Intake/Output this shift: Total I/O In: 240 [P.O.:240] Out: -   Lab Results:  Recent Labs  02/14/14 0740 02/14/14 1700 02/15/14 0512 02/16/14 0850  WBC 1.4*  --  2.0* 2.8*  HGB 12.9  --  12.5 13.9  HCT 38.0  --  37.2 42.2  PLT 26* 23* 33* 35*   BMET  Recent Labs  02/14/14 0740 02/15/14 0512 02/16/14 0610  NA 137 134* 134*  K 3.3* 3.5* 3.5*  CL 94* 91* 91*  CO2 23 29 31   GLUCOSE 293* 276* 82  BUN 7 7 5*  CREATININE 0.34* 0.40* 0.41*  CALCIUM 8.3* 9.0 9.2   LFT  Recent Labs  02/14/14 0740 02/15/14 0512 02/16/14 0610  PROT 7.5 7.3 7.7  ALBUMIN 3.1* 3.3* 3.3*  AST 164* 128* 114*  ALT 106* 82* 82*  ALKPHOS 144* 133* 138*  BILITOT 1.4* 1.5* 1.5*   PT/INR  Recent Labs  02/13/14 2108 02/14/14 1700  LABPROT 15.0 15.6*  INR 1.16 1.22     Studies/Results: Nm Hepatobiliary Including Gb  02/14/2014   CLINICAL DATA:  Chronic abdominal pain. History of underlying parenchymal liver disease and hepatitis-C. Known cholelithiasis.  EXAM: NUCLEAR MEDICINE HEPATOBILIARY IMAGING WITH GALLBLADDER EF  Views:  Anterior, right lateral right upper quadrant  Radionuclide:  Technetium 34m Choletec  Dose:  5.0 mCi  Route of administration: Intravenous  COMPARISON:  Ultrasound right upper quadrant February 14, 2014  FINDINGS: The liver uptake of radiotracer is within normal limits. There is prompt visualization of gallbladder and small bowel, indicating patency of the cystic and common bile ducts. A weight based dose, 1.11 mcg, of CCK was administered intravenously with calculation of the computer generated ejection fraction of radiotracer from the gallbladder. The patient did not experience clinical symptoms with the CCK administration. The computer generated ejection fraction of radiotracer from the gallbladder is abnormally low at 18%, normal greater than 38%.  IMPRESSION: Abnormally low ejection fraction of radiotracer from the gallbladder, a finding indicative of biliary dyskinesia. Cystic and common bile ducts are patent as is evidenced by visualization of gallbladder and small bowel.   Electronically Signed   By: Lowella Grip M.D.   On: 02/14/2014 15:03   US Abdomen  Limited  02/14/2014   CLINICAL DATA:  Check for possible ascites for paracentesis  EXAM: LIMITED ABDOMEN ULTRASOUND FOR ASCITES  TECHNIQUE: Limited ultrasound survey for ascites was performed in all four abdominal quadrants.  COMPARISON:  None.  FINDINGS: Limited evaluation of the abdomen in all 4 quadrants shows no evidence of significant ascites.  IMPRESSION: No evidence of significant ascites to allow for paracentesis.   Electronically Signed   By: Inez Catalina M.D.   On: 02/14/2014 15:56   Scheduled Meds: . sodium chloride   Intravenous Once  . cefTRIAXone (ROCEPHIN)  IV  2 g  Intravenous Q24H  . folic acid  1 mg Oral Daily  . insulin aspart  0-15 Units Subcutaneous TID WC  . insulin glargine  18 Units Subcutaneous QHS  . lidocaine-EPINEPHrine  20 mL Infiltration Once  . LORazepam  0-4 mg Intravenous Q12H  . multivitamin with minerals  1 tablet Oral Daily  . pantoprazole  40 mg Oral Q0600  . sertraline  150 mg Oral Daily  . sodium chloride  3 mL Intravenous Q12H  . thiamine  100 mg Oral Daily   Or  . thiamine  100 mg Intravenous Daily  . vancomycin  750 mg Intravenous Q12H   Continuous Infusions: . sodium chloride 0.9 % 1,000 mL with potassium chloride 40 mEq infusion 75 mL/hr at 02/14/14 1251   PRN Meds:.diphenhydrAMINE, HYDROmorphone (DILAUDID) injection, LORazepam, LORazepam, morphine injection, naphazoline, ondansetron (ZOFRAN) IV, ondansetron  ASSESMENT:   * GIB with hematemesis.  EGD in 11/2013: MWT and gastric ulcers. No varices or portal HTN. Pt says she has been compliant with daily Nexium.  Rectal bleeding. ? Rectal varices.  No previous colonoscopy.  * Cirrhosis, hepatitis C/ETOH/hemachromatosis. Virus never treated. LFTs chronically elevated.  Currently with acute alcoholic hepatitis. Hepatitis discriminant function score 65 10/27.  LFTs improving.  * Ascites, not enough to tap per 10/26 PA-C assessment. Unlikely she has SBP and related sepsis.  * Longstanding, chronic abdominal pain. Workups in past unrevealing.  * Cholelithiasis. Evaluated in setting of chronic right sided abdominal pain by gen surgery 11/2013. Did not feel she had cholecystitis. Hx alcoholic pancreatitis but currently Lipase is 31.  HIDA scan 10/26with low GB EF c/w biliary dyskinesia. Not a surgical candidate at present so no need to seek surgical consult.  * Hyponatremia.  * Alcoholism, chronic/refractory.  * DM. Type 2 on insulin and oral agents at home.  * -  Thrombocytopenia, chronic but currently lower than previously. PT/INR normal. Splenomegaly on CT 12/24/13.     -  Leukopenia.  Both problems improving but not resolved.  Both attributed by Dr Adria Devon, oncologist, to ETOH and possibly Hep C bone marrow suppression, splenic sequestration of platelets.  * Major depression.    PLAN   *  Flex sig when over her ETOH withdrawal. Soonest is likely to be 10/30.  PO protonix.  *  Will go ahead and stop the rocephin and vanc given no fevers, negative CXR, unimpressive U/A.     Azucena Freed  02/16/2014, 11:20 AM Pager: [phone]

## 2014-02-16 NOTE — Progress Notes (Signed)
Results for DINORAH, MASULLO (MRN [id number]) as of 02/16/2014 11:13  Ref. Range 02/15/2014 11:53 02/15/2014 16:58 02/15/2014 21:11 02/16/2014 08:01 02/16/2014 08:46  Glucose-Capillary Latest Range: 70-99 mg/dL 322 (H) 121 (H) 182 (H) 67 (L) 154 (H)  Fasting CBG today was 67 mg/dl. Lantus was increased yesterday to 18 units every HS.  Recommend that Lantus be decreased to 14-16 units every HS.  Continue Novolog MODERATE correction scale TID. Will continue to follow.  Harvel Ricks RN BSN CDE

## 2014-02-16 NOTE — Progress Notes (Signed)
Agree with Ms. Sandi Carne assessment and plan. Will f/u 10/29

## 2014-02-16 NOTE — Progress Notes (Signed)
PROGRESS NOTE  [name] RCV:[id number] DOB: Jun 09, 1965 DOA: 02/13/2014 PCP: Cathlean Cower, MD  HPI/Recap of past 24 hours: 48 year old female with past medical history of ongoing alcohol abuse with secondary cirrhosis and hepatitis C plus diabetes mellitus as well as previous episodes of bleeding gastric ulcers who was admitted on 10/26 for abdominal pain and hematemesis and found to be with softer blood pressures, platelet count of 37 and lactic acid level of 2.3 with Hemoccult positive stool. Patient also recently done cocaine. Started on empiric Rocephin and GI consult. Hemoglobin on admission at 12.9.  Placed on Ativan protocol. She herself is currently somnolent. Not able to tap ascites secondary to minimal amount seen. Felt less likely she has peritonitis. Hemoglobin is actually gone up since admission  Assessment/Plan: Principal Problem:   Abdominal pain: Secondary to gastritis Active Problems:   Cirrhosis: Chronic   DM (diabetes mellitus), type 2: Sliding scale, stable   Alcohol dependence: Currently on scheduled Ativan protocol   Cocaine abuse   MDD (major depressive disorder)   Hematemesis: Suspected varices. GI following and possible EGD once stabilized from alcohol withdrawal standpoint   SBP (spontaneous bacterial peritonitis)   GIB (gastrointestinal bleeding)   Thrombocytopenia: Secondary to chronic liver disease, no evidence of spontaneous acute bleeding   Hypokalemia: Replacing as needed    Alcoholic hepatitis, stable   Code Status: Full code  Family Communication: No family present  Disposition Plan: Continue in the hospital until finished Ativan protocol and active bleeding ruled out   Consultants:  Gastroenterology  Procedures:  None  Antibiotics:  Empiric Rocephin 10/27-present   Objective: BP 119/83  Pulse 102  Temp(Src) 98.4 F (36.9 C) (Oral)  Resp 20  Ht 5\' 3"  (1.6 m)  Wt 56.609 kg (124 lb 12.8 oz)  BMI 22.11 kg/m2  SpO2 96%  LMP  11/01/2011  Intake/Output Summary (Last 24 hours) at 02/16/14 1719 Last data filed at 02/16/14 1700  Gross per 24 hour  Intake   1033 ml  Output      0 ml  Net   1033 ml   Filed Weights   02/14/14 2100 02/15/14 2054  Weight: 60.328 kg (133 lb) 56.609 kg (124 lb 12.8 oz)    Exam:   General:  Somnolent  Cardiovascular: Regular rate and rhythm, S1-S2, borderline tachycardia  Respiratory: Clear to auscultation bilaterally  Abdomen: Soft, distended, hypoactive bowel sounds  Musculoskeletal: No clubbing or cyanosis, trace pitting edema bilaterally   Data Reviewed: Basic Metabolic Panel:  Recent Labs Lab 02/13/14 2108 02/14/14 0740 02/15/14 0512 02/16/14 0610  NA 137 137 134* 134*  K 3.9 3.3* 3.5* 3.5*  CL 94* 94* 91* 91*  CO2 28 23 29 31   GLUCOSE 297* 293* 276* 82  BUN 6 7 7  5*  CREATININE 0.36* 0.34* 0.40* 0.41*  CALCIUM 8.9 8.3* 9.0 9.2  MG  --  1.5 1.4* 1.9   Liver Function Tests:  Recent Labs Lab 02/13/14 2108 02/14/14 0740 02/15/14 0512 02/16/14 0610  AST 205* 164* 128* 114*  ALT 117* 106* 82* 82*  ALKPHOS 165* 144* 133* 138*  BILITOT 1.6* 1.4* 1.5* 1.5*  PROT 8.2 7.5 7.3 7.7  ALBUMIN 3.4* 3.1* 3.3* 3.3*    Recent Labs Lab 02/14/14 0740  LIPASE 31   No results found for this basename: AMMONIA,  in the last 168 hours CBC:  Recent Labs Lab 02/13/14 2108 02/14/14 0740 02/14/14 1700 02/15/14 0512 02/16/14 0850  WBC 2.1* 1.4*  --  2.0* 2.8*  NEUTROABS 0.8* 0.8*  --  1.1* 1.9  HGB 14.0 12.9  --  12.5 13.9  HCT 40.7 38.0  --  37.2 42.2  MCV 102.5* 103.0*  --  105.4* 107.7*  PLT 37* 26* 23* 33* 35*   Cardiac Enzymes:   No results found for this basename: CKTOTAL, CKMB, CKMBINDEX, TROPONINI,  in the last 168 hours BNP (last 3 results) No results found for this basename: PROBNP,  in the last 8760 hours CBG:  Recent Labs Lab 02/15/14 1658 02/15/14 2111 02/16/14 0801 02/16/14 0846 02/16/14 1133  GLUCAP 121* 182* 67* 154* 158*     Recent Results (from the past 240 hour(s))  CULTURE, BLOOD (ROUTINE X 2)     Status: None   Collection Time    02/14/14  7:20 AM      Result Value Ref Range Status   Specimen Description BLOOD RIGHT HAND   Final   Special Requests BOTTLES DRAWN AEROBIC AND ANAEROBIC 5CC   Final   Culture  Setup Time     Final   Value: 02/14/2014 11:16     Performed at [facility]   Culture     Final   Value:        BLOOD CULTURE RECEIVED NO GROWTH TO DATE CULTURE WILL BE HELD FOR 5 DAYS BEFORE ISSUING A FINAL NEGATIVE REPORT     Performed at [facility]   Report Status PENDING   Incomplete  CULTURE, BLOOD (ROUTINE X 2)     Status: None   Collection Time    02/14/14  7:36 AM      Result Value Ref Range Status   Specimen Description BLOOD LEFT HAND   Final   Special Requests BOTTLES DRAWN AEROBIC AND ANAEROBIC 5CC   Final   Culture  Setup Time     Final   Value: 02/14/2014 11:15     Performed at [facility]   Culture     Final   Value:        BLOOD CULTURE RECEIVED NO GROWTH TO DATE CULTURE WILL BE HELD FOR 5 DAYS BEFORE ISSUING A FINAL NEGATIVE REPORT     Performed at [facility]   Report Status PENDING   Incomplete     Studies: No results found.  Scheduled Meds: . sodium chloride   Intravenous Once  . folic acid  1 mg Oral Daily  . insulin aspart  0-15 Units Subcutaneous TID WC  . insulin glargine  18 Units Subcutaneous QHS  . lidocaine-EPINEPHrine  20 mL Infiltration Once  . LORazepam  0-4 mg Intravenous Q12H  . multivitamin with minerals  1 tablet Oral Daily  . pantoprazole  40 mg Oral Q0600  . sertraline  150 mg Oral Daily  . sodium chloride  3 mL Intravenous Q12H  . thiamine  100 mg Oral Daily   Or  . thiamine  100 mg Intravenous Daily    Continuous Infusions: . sodium chloride 0.9 % 1,000 mL with potassium chloride 40 mEq infusion 75 mL/hr at 02/16/14 1703     Time spent: 25 minutes  [facility] Pager  [phone]. If 7PM-7AM, please contact night-coverage at [email or web address], password [facility] 02/16/2014, 5:19 PM  LOS: 3 days

## 2014-02-17 LAB — GLUCOSE, CAPILLARY
GLUCOSE-CAPILLARY: 177 mg/dL — AB (ref 70–99)
Glucose-Capillary: 141 mg/dL — ABNORMAL HIGH (ref 70–99)
Glucose-Capillary: 285 mg/dL — ABNORMAL HIGH (ref 70–99)
Glucose-Capillary: 238 mg/dL — ABNORMAL HIGH (ref 70–99)

## 2014-02-17 MED ORDER — POLYETHYLENE GLYCOL 3350 17 G PO PACK
17.0000 g | PACK | Freq: Every day | ORAL | Status: DC
  Administered 2014-02-17 – 2014-02-19 (×3): 17 g via ORAL
  Filled 2014-02-17 (×3): qty 1

## 2014-02-17 MED ORDER — METOCLOPRAMIDE HCL 5 MG/ML IJ SOLN: 10.0000 mg | Freq: Once | INTRAMUSCULAR | Status: DC

## 2014-02-17 MED ORDER — METOCLOPRAMIDE HCL 5 MG/ML IJ SOLN
10.0000 mg | Freq: Once | INTRAMUSCULAR | Status: AC
  Administered 2014-02-18: 10 mg via INTRAVENOUS
  Filled 2014-02-17: qty 2

## 2014-02-17 MED ORDER — BISACODYL 5 MG PO TBEC
20.0000 mg | DELAYED_RELEASE_TABLET | Freq: Once | ORAL | Status: AC
  Administered 2014-02-17: 20 mg via ORAL
  Filled 2014-02-17: qty 4

## 2014-02-17 MED ORDER — PEG 3350-KCL-NA BICARB-NACL 420 G PO SOLR
2000.0000 mL | Freq: Once | ORAL | Status: DC
  Filled 2014-02-17: qty 4000

## 2014-02-17 MED ORDER — METOCLOPRAMIDE HCL 5 MG/ML IJ SOLN
10.0000 mg | Freq: Once | INTRAMUSCULAR | Status: AC
  Administered 2014-02-17: 10 mg via INTRAVENOUS
  Filled 2014-02-17: qty 2

## 2014-02-17 MED ORDER — PEG 3350-KCL-NA BICARB-NACL 420 G PO SOLR
2000.0000 mL | Freq: Once | ORAL | Status: AC
  Administered 2014-02-17: 2000 mL via ORAL
  Filled 2014-02-17: qty 4000

## 2014-02-17 MED ORDER — CLOTRIMAZOLE 10 MG MT TROC
10.0000 mg | Freq: Every day | OROMUCOSAL | Status: DC
  Administered 2014-02-17 – 2014-02-19 (×11): 10 mg via ORAL
  Filled 2014-02-17 (×15): qty 1

## 2014-02-17 NOTE — Progress Notes (Signed)
PROGRESS NOTE  [name] NVB:[id number] DOB: Mar 25, 1966 DOA: 02/13/2014 PCP: Cathlean Cower, MD  HPI/Recap of past 71 hours: 48 year old female with past medical history of ongoing alcohol abuse with secondary cirrhosis and hepatitis C plus diabetes mellitus as well as previous episodes of bleeding gastric ulcers who was admitted on 10/26 for abdominal pain and hematemesis and found to be with softer blood pressures, platelet count of 37 and lactic acid level of 2.3 with Hemoccult positive stool. Patient also recently done cocaine. Started on empiric Rocephin and GI consult. Hemoglobin on admission at 12.9.  Placed on Ativan protocol. She herself is currently somnolent. Not able to tap ascites secondary to minimal amount seen. Felt less likely she has peritonitis. Hemoglobin is actually gone up since admission.    Today patient doing okay. Feels tired. Tolerating soft diet.  Assessment/Plan: Principal Problem:   Abdominal pain: Secondary to gastritis. Improved, tolerating by mouth Active Problems:   Cirrhosis: Chronic   DM (diabetes mellitus), type 2: Sliding scale, stable. Isolated CBG above 200, otherwise staying below   Alcohol dependence: Currently on scheduled Ativan protocol   Cocaine abuse   MDD (major depressive disorder)   Hematemesis: Suspected varices. GI following and plan for colonoscopy tomorrow-pending platelet count and no signs of active bleeding    GIB (gastrointestinal bleeding)   Thrombocytopenia: Secondary to chronic liver disease, no evidence of spontaneous acute bleeding   Hypokalemia: Replacing as needed    Alcoholic hepatitis, stable   Code Status: Full code  Family Communication: No family present  Disposition Plan: Continue in the hospital until finished Ativan protocol and active bleeding ruled out   Consultants:  Gastroenterology  Procedures:  None  Antibiotics:  Empiric Rocephin 10/27-present   Objective: BP 127/81  Pulse 92   Temp(Src) 98.8 F (37.1 C) (Oral)  Resp 20  Ht 5\' 3"  (1.6 m)  Wt 59.875 kg (132 lb)  BMI 23.39 kg/m2  SpO2 98%  LMP 11/01/2011  Intake/Output Summary (Last 24 hours) at 02/17/14 1522 Last data filed at 02/17/14 0900  Gross per 24 hour  Intake    480 ml  Output      0 ml  Net    480 ml   Filed Weights   02/14/14 2100 02/15/14 2054 02/16/14 2108  Weight: 60.328 kg (133 lb) 56.609 kg (124 lb 12.8 oz) 59.875 kg (132 lb)    Exam:   General:  Fatigued, no acute distress  Cardiovascular: Regular rate and rhythm, S1-S2,  Respiratory: Clear to auscultation bilaterally  Abdomen: Soft, distended, nontender, hypoactive bowel sounds  Musculoskeletal: No clubbing or cyanosis, trace pitting edema bilaterally   Data Reviewed: Basic Metabolic Panel:  Recent Labs Lab 02/13/14 2108 02/14/14 0740 02/15/14 0512 02/16/14 0610  NA 137 137 134* 134*  K 3.9 3.3* 3.5* 3.5*  CL 94* 94* 91* 91*  CO2 28 23 29 31   GLUCOSE 297* 293* 276* 82  BUN 6 7 7  5*  CREATININE 0.36* 0.34* 0.40* 0.41*  CALCIUM 8.9 8.3* 9.0 9.2  MG  --  1.5 1.4* 1.9   Liver Function Tests:  Recent Labs Lab 02/13/14 2108 02/14/14 0740 02/15/14 0512 02/16/14 0610  AST 205* 164* 128* 114*  ALT 117* 106* 82* 82*  ALKPHOS 165* 144* 133* 138*  BILITOT 1.6* 1.4* 1.5* 1.5*  PROT 8.2 7.5 7.3 7.7  ALBUMIN 3.4* 3.1* 3.3* 3.3*    Recent Labs Lab 02/14/14 0740  LIPASE 31   No results found for this basename: AMMONIA,  in the last 168 hours CBC:  Recent Labs Lab 02/13/14 2108 02/14/14 0740 02/14/14 1700 02/15/14 0512 02/16/14 0850  WBC 2.1* 1.4*  --  2.0* 2.8*  NEUTROABS 0.8* 0.8*  --  1.1* 1.9  HGB 14.0 12.9  --  12.5 13.9  HCT 40.7 38.0  --  37.2 42.2  MCV 102.5* 103.0*  --  105.4* 107.7*  PLT 37* 26* 23* 33* 35*   Cardiac Enzymes:   No results found for this basename: CKTOTAL, CKMB, CKMBINDEX, TROPONINI,  in the last 168 hours BNP (last 3 results) No results found for this basename: PROBNP,  in  the last 8760 hours CBG:  Recent Labs Lab 02/16/14 0846 02/16/14 1133 02/16/14 1655 02/16/14 2106 02/17/14 1204  GLUCAP 154* 158* 80 233* 177*    Recent Results (from the past 240 hour(s))  CULTURE, BLOOD (ROUTINE X 2)     Status: None   Collection Time    02/14/14  7:20 AM      Result Value Ref Range Status   Specimen Description BLOOD RIGHT HAND   Final   Special Requests BOTTLES DRAWN AEROBIC AND ANAEROBIC 5CC   Final   Culture  Setup Time     Final   Value: 02/14/2014 11:16     Performed at [facility]   Culture     Final   Value:        BLOOD CULTURE RECEIVED NO GROWTH TO DATE CULTURE WILL BE HELD FOR 5 DAYS BEFORE ISSUING A FINAL NEGATIVE REPORT     Performed at [facility]   Report Status PENDING   Incomplete  CULTURE, BLOOD (ROUTINE X 2)     Status: None   Collection Time    02/14/14  7:36 AM      Result Value Ref Range Status   Specimen Description BLOOD LEFT HAND   Final   Special Requests BOTTLES DRAWN AEROBIC AND ANAEROBIC 5CC   Final   Culture  Setup Time     Final   Value: 02/14/2014 11:15     Performed at [facility]   Culture     Final   Value:        BLOOD CULTURE RECEIVED NO GROWTH TO DATE CULTURE WILL BE HELD FOR 5 DAYS BEFORE ISSUING A FINAL NEGATIVE REPORT     Performed at [facility]   Report Status PENDING   Incomplete     Studies: No results found.  Scheduled Meds: . sodium chloride   Intravenous Once  . bisacodyl  20 mg Oral Once  . clotrimazole  10 mg Oral 5 X Daily  . folic acid  1 mg Oral Daily  . insulin aspart  0-15 Units Subcutaneous TID WC  . insulin glargine  18 Units Subcutaneous QHS  . lidocaine-EPINEPHrine  20 mL Infiltration Once  . LORazepam  0-4 mg Intravenous Q12H  . metoCLOPramide (REGLAN) injection  10 mg Intravenous Once  . [START ON 02/18/2014] metoCLOPramide (REGLAN) injection  10 mg Intravenous Once  . multivitamin with minerals  1 tablet Oral Daily  . pantoprazole  40 mg Oral  Q0600  . polyethylene glycol  17 g Oral Daily  . polyethylene glycol-electrolytes  2,000 mL Oral Once  . [START ON 02/18/2014] polyethylene glycol-electrolytes  2,000 mL Oral Once  . sertraline  150 mg Oral Daily  . sodium chloride  3 mL Intravenous Q12H  . thiamine  100 mg Oral Daily   Or  . thiamine  100 mg Intravenous Daily    Continuous Infusions: . sodium chloride 0.9 % 1,000 mL with potassium chloride 40 mEq infusion 75 mL/hr at 02/16/14 1703     Time spent: 20 minutes  [place] Pager [phone]. If 7PM-7AM, please contact night-coverage at [email or web address], password [facility] 02/17/2014, 3:22 PM  LOS: 4 days

## 2014-02-17 NOTE — Progress Notes (Signed)
Daily Rounding Note  02/17/2014, 9:59 AM  LOS: 4 days   SUBJECTIVE:       Tolerating soft diet.  Still c/o pain in lower abdomen.  Last BM was several days ago, no rectal bleeding.  Throat feels sore. Her only dose of Ativan yesterday  was at 2100.   OBJECTIVE:         Vital signs in last 24 hours:    Temp:  [98.4 F (36.9 C)-99 F (37.2 C)] 98.6 F (37 C) (10/29 0626) Pulse Rate:  [98-102] 98 (10/29 0626) Resp:  [20] 20 (10/29 0626) BP: (119-137)/(80-86) 137/80 mmHg (10/29 0626) SpO2:  [96 %-99 %] 98 % (10/29 0626) Weight:  [132 lb (59.875 kg)] 132 lb (59.875 kg) (10/28 2108) Last BM Date: 02/15/14 Filed Weights   02/14/14 2100 02/15/14 2054 02/16/14 2108  Weight: 133 lb (60.328 kg) 124 lb 12.8 oz (56.609 kg) 132 lb (59.875 kg)   General: looks slightly better but still ill.  Less tremulous.    Heart: RRR Chest: clear bil.  Hoarse voice and pt unable to speak above whiper level.  Abdomen: soft, firm, slightly distended.  BS normoactive.  tneder without guard or rebound in lower abdomen bil.   Extremities: no CCE.  Neuro/Psych:  Appropriate, still quite sonolent but overall less so.   Intake/Output from previous day: 10/28 0701 - 10/29 0700 In: 600 [P.O.:600] Out: -   Intake/Output this shift:    Lab Results:  Recent Labs  02/14/14 1700 02/15/14 0512 02/16/14 0850  WBC  --  2.0* 2.8*  HGB  --  12.5 13.9  HCT  --  37.2 42.2  PLT 23* 33* 35*   BMET  Recent Labs  02/15/14 0512 02/16/14 0610  NA 134* 134*  K 3.5* 3.5*  CL 91* 91*  CO2 29 31  GLUCOSE 276* 82  BUN 7 5*  CREATININE 0.40* 0.41*  CALCIUM 9.0 9.2   LFT  Recent Labs  02/15/14 0512 02/16/14 0610  PROT 7.3 7.7  ALBUMIN 3.3* 3.3*  AST 128* 114*  ALT 82* 82*  ALKPHOS 133* 138*  BILITOT 1.5* 1.5*   PT/INR  Recent Labs  02/14/14 1700  LABPROT 15.6*  INR 1.22   Scheduled Meds: . sodium chloride   Intravenous Once  .  folic acid  1 mg Oral Daily  . insulin aspart  0-15 Units Subcutaneous TID WC  . insulin glargine  18 Units Subcutaneous QHS  . lidocaine-EPINEPHrine  20 mL Infiltration Once  . LORazepam  0-4 mg Intravenous Q12H  . multivitamin with minerals  1 tablet Oral Daily  . pantoprazole  40 mg Oral Q0600  . polyethylene glycol  17 g Oral Daily  . sertraline  150 mg Oral Daily  . sodium chloride  3 mL Intravenous Q12H  . thiamine  100 mg Oral Daily   Or  . thiamine  100 mg Intravenous Daily   Continuous Infusions: . sodium chloride 0.9 % 1,000 mL with potassium chloride 40 mEq infusion 75 mL/hr at 02/16/14 1703   PRN Meds:.diphenhydrAMINE, HYDROmorphone (DILAUDID) injection, morphine injection, naphazoline, ondansetron (ZOFRAN) IV, ondansetron  Scheduled Meds: . sodium chloride   Intravenous Once  . folic acid  1 mg Oral Daily  . insulin aspart  0-15 Units Subcutaneous TID WC  . insulin glargine  18 Units Subcutaneous QHS  . lidocaine-EPINEPHrine  20 mL Infiltration Once  . LORazepam  0-4 mg Intravenous Q12H  . multivitamin  with minerals  1 tablet Oral Daily  . pantoprazole  40 mg Oral Q0600  . polyethylene glycol  17 g Oral Daily  . sertraline  150 mg Oral Daily  . sodium chloride  3 mL Intravenous Q12H  . thiamine  100 mg Oral Daily   Or  . thiamine  100 mg Intravenous Daily   Continuous Infusions: . sodium chloride 0.9 % 1,000 mL with potassium chloride 40 mEq infusion 75 mL/hr at 02/16/14 1703   PRN Meds:.diphenhydrAMINE, HYDROmorphone (DILAUDID) injection, morphine injection, naphazoline, ondansetron (ZOFRAN) IV, ondansetron   ASSESMENT:   * GIB with hematemesis. Has resolved.  EGD in 11/2013: MWT and gastric ulcers. No varices or portal HTN. Pt says she has been compliant with daily Nexium.  No need to repeat the EGD.  Rectal bleeding. ? Rectal varices. Pt constipated.  No previous colonoscopy. Flex sig planned when pt stable * Cirrhosis, hepatitis C/ETOH/hemachromatosis.  Virus never treated. LFTs chronically elevated.  Currently with acute alcoholic hepatitis. Hepatitis discriminant function score 65 10/27.  LFTs improving.  * Ascites, not enough to tap per 10/26 PA-C assessment. Unlikely she has SBP and related sepsis.  *  Oral candidiasis.  * Longstanding, chronic abdominal pain. Workups in past unrevealing.  * Cholelithiasis. Evaluated in setting of chronic right sided abdominal pain by gen surgery 11/2013. Did not feel she had cholecystitis. Hx alcoholic pancreatitis but currently Lipase is 31.  HIDA scan 10/26with low GB EF c/w biliary dyskinesia. Not a surgical candidate at present so no need to seek surgical consult.  * Hyponatremia.  * Alcoholism, chronic/refractory.  * DM. Type 2 on insulin and oral agents at home.  * - Thrombocytopenia, chronic but currently lower than previously. PT/INR normal. Splenomegaly on CT 12/24/13.     - Leukopenia.  Both problems improving but not resolved. Both attributed by Dr Adria Devon, oncologist, to ETOH and possibly Hep C bone marrow suppression, splenic sequestration of platelets.  * Major depression.     PLAN   *  Arranged flex sig tomorrow 10/30 at 1330 only if platelets are at least 20K.  Tap water enema prep.  *  Added daily Miralax. Added clotrimazole lozenges.  *   CBC tomorrow AM.  Also will check a TSH as in review it was trending lower over last few years, last assayed 10 months ago.    Azucena Freed  02/17/2014, 9:59 AM Pager: [phone]  [facility] Attending  I have also seen and assessed the patient and agree with the above note.  Since she is 38 and had the bleeding will change to attempting a full colonoscopy.  I have explained this to her. The risks and benefits as well as alternatives of endoscopic procedure(s) have been discussed and reviewed. All questions answered. The patient agrees to proceed.   Gatha Mayer, MD, [facility] [phone] (pager) 02/17/2014 3:20  PM

## 2014-02-18 ENCOUNTER — Encounter (HOSPITAL_COMMUNITY): Payer: Self-pay

## 2014-02-18 ENCOUNTER — Encounter (HOSPITAL_COMMUNITY)
Admission: EM | Disposition: A | Discharge status: Home / Self Care | Payer: Self-pay | Source: Home / Self Care | Attending: Internal Medicine

## 2014-02-18 ENCOUNTER — Encounter (HOSPITAL_COMMUNITY): Payer: MEDICAID | Admitting: Anesthesiology

## 2014-02-18 ENCOUNTER — Inpatient Hospital Stay (HOSPITAL_COMMUNITY): Payer: MEDICAID | Admitting: Anesthesiology

## 2014-02-18 DIAGNOSIS — K635 Polyp of colon: Secondary | ICD-10-CM | POA: Diagnosis present

## 2014-02-18 DIAGNOSIS — K649 Unspecified hemorrhoids: Secondary | ICD-10-CM

## 2014-02-18 HISTORY — PX: COLONOSCOPY: SHX5424

## 2014-02-18 LAB — CBC
HCT: 42.9 % (ref 36.0–46.0)
Hemoglobin: 14.1 g/dL (ref 12.0–15.0)
MCH: 35.9 pg — ABNORMAL HIGH (ref 26.0–34.0)
MCHC: 32.9 g/dL (ref 30.0–36.0)
MCV: 109.2 fL — ABNORMAL HIGH (ref 78.0–100.0)
PLATELETS: 47 10*3/uL — AB (ref 150–400)
RBC: 3.93 MIL/uL (ref 3.87–5.11)
RDW: 14.4 % (ref 11.5–15.5)
WBC: 2.6 10*3/uL — AB (ref 4.0–10.5)

## 2014-02-18 LAB — GLUCOSE, CAPILLARY
GLUCOSE-CAPILLARY: 277 mg/dL — AB (ref 70–99)
Glucose-Capillary: 66 mg/dL — ABNORMAL LOW (ref 70–99)
Glucose-Capillary: 151 mg/dL — ABNORMAL HIGH (ref 70–99)
Glucose-Capillary: 97 mg/dL (ref 70–99)
Glucose-Capillary: 182 mg/dL — ABNORMAL HIGH (ref 70–99)

## 2014-02-18 LAB — TSH: TSH: 1.89 u[IU]/mL (ref 0.350–4.500)

## 2014-02-18 SURGERY — COLONOSCOPY
Anesthesia: Monitor Anesthesia Care

## 2014-02-18 MED ORDER — FENTANYL CITRATE 0.05 MG/ML IJ SOLN
INTRAMUSCULAR | Status: DC | PRN
  Administered 2014-02-18 (×3): 25 ug via INTRAVENOUS

## 2014-02-18 MED ORDER — PROPOFOL INFUSION 10 MG/ML OPTIME
INTRAVENOUS | Status: DC | PRN
  Administered 2014-02-18: 100 ug/kg/min via INTRAVENOUS

## 2014-02-18 MED ORDER — LACTATED RINGERS IV SOLN
INTRAVENOUS | Status: DC
  Administered 2014-02-18: 10:00:00 via INTRAVENOUS

## 2014-02-18 MED ORDER — MIDAZOLAM HCL 5 MG/5ML IJ SOLN
INTRAMUSCULAR | Status: DC | PRN
  Administered 2014-02-18: 1 mg via INTRAVENOUS

## 2014-02-18 MED ORDER — DEXTROSE 50 % IV SOLN
25.0000 mL | Freq: Once | INTRAVENOUS | Status: AC | PRN
  Administered 2014-02-18: 25 mL via INTRAVENOUS
  Filled 2014-02-18: qty 50

## 2014-02-18 NOTE — Anesthesia Postprocedure Evaluation (Signed)
  Anesthesia Post-op Note  Patient: [name]  Procedure(s) Performed: Procedure(s): COLONOSCOPY (N/A)  Patient Location: Endoscopy Unit  Anesthesia Type:MAC  Level of Consciousness: awake and alert   Airway and Oxygen Therapy: Patient Spontanous Breathing  Post-op Pain: none  Post-op Assessment: Post-op Vital signs reviewed  Post-op Vital Signs: stable  Last Vitals:  Filed Vitals:   02/18/14 1348  BP: 117/72  Pulse: 71  Temp: 36.7 C  Resp: 16    Complications: No apparent anesthesia complications

## 2014-02-18 NOTE — Progress Notes (Signed)
PROGRESS NOTE  [name] TFT:[id number] DOB: Jun 25, 1965 DOA: 02/13/2014 PCP: Cathlean Cower, MD  HPI/Recap of past 32 hours: 48 year old female with past medical history of ongoing alcohol abuse with secondary cirrhosis and hepatitis C plus diabetes mellitus as well as previous episodes of bleeding gastric ulcers who was admitted on 10/26 for abdominal pain and hematemesis and found to be with softer blood pressures, platelet count of 37 and lactic acid level of 2.3 with Hemoccult positive stool. Patient also recently done cocaine. Started on empiric Rocephin and GI consult. Hemoglobin on admission at 12.9.  Placed on Ativan protocol. She herself is currently somnolent. Not able to tap ascites secondary to minimal amount seen. Felt less likely she has peritonitis. Hemoglobin is actually gone up since admission.    Seen before colonoscopy. No complaints, resting heavily  Assessment/Plan: Principal Problem:   Abdominal pain: Secondary to gastritis. Improved, tolerating by mouth Active Problems:   Cirrhosis: Chronic   DM (diabetes mellitus), type 2: Sliding scale, stable. Isolated CBG above 200, otherwise staying below   Alcohol dependence: Currently on scheduled Ativan protocol   Cocaine abuse   MDD (major depressive disorder)   Hematemesis: Suspected varices. Status post colonoscopy noting only friable mucosa, but no signs of active bleeding    GIB (gastrointestinal bleeding)   Thrombocytopenia: Secondary to chronic liver disease, no evidence of spontaneous acute bleeding   Hypokalemia: Replacing as needed    Alcoholic hepatitis, stable   Code Status: Full code  Family Communication: No family present  Disposition Plan: Likely discharge tomorrow if cleared by GI Consultants:  Gastroenterology  Procedures:  None  Antibiotics:  Empiric Rocephin 10/27-present   Objective: BP 117/72  Pulse 71  Temp(Src) 98 F (36.7 C) (Oral)  Resp 16  Ht 5\' 3"  (1.6 m)  Wt 56.7 kg  (125 lb)  BMI 22.15 kg/m2  SpO2 100%  LMP 11/01/2011  Intake/Output Summary (Last 24 hours) at 02/18/14 1551 Last data filed at 02/18/14 1300  Gross per 24 hour  Intake    500 ml  Output      0 ml  Net    500 ml   Filed Weights   02/15/14 2054 02/16/14 2108 02/17/14 2135  Weight: 56.609 kg (124 lb 12.8 oz) 59.875 kg (132 lb) 56.7 kg (125 lb)    Exam:   General:  Resting comfortably  Cardiovascular: Regular rate and rhythm, S1-S2,  Respiratory: Clear to auscultation bilaterally  Abdomen: Soft, distended, nontender, hyper active bowel sounds  Musculoskeletal: No clubbing or cyanosis, trace pitting edema bilaterally   Data Reviewed: Basic Metabolic Panel:  Recent Labs Lab 02/13/14 2108 02/14/14 0740 02/15/14 0512 02/16/14 0610  NA 137 137 134* 134*  K 3.9 3.3* 3.5* 3.5*  CL 94* 94* 91* 91*  CO2 28 23 29 31   GLUCOSE 297* 293* 276* 82  BUN 6 7 7  5*  CREATININE 0.36* 0.34* 0.40* 0.41*  CALCIUM 8.9 8.3* 9.0 9.2  MG  --  1.5 1.4* 1.9   Liver Function Tests:  Recent Labs Lab 02/13/14 2108 02/14/14 0740 02/15/14 0512 02/16/14 0610  AST 205* 164* 128* 114*  ALT 117* 106* 82* 82*  ALKPHOS 165* 144* 133* 138*  BILITOT 1.6* 1.4* 1.5* 1.5*  PROT 8.2 7.5 7.3 7.7  ALBUMIN 3.4* 3.1* 3.3* 3.3*    Recent Labs Lab 02/14/14 0740  LIPASE 31   No results found for this basename: AMMONIA,  in the last 168 hours CBC:  Recent Labs Lab 02/13/14 2108 02/14/14  0740 02/14/14 1700 02/15/14 0512 02/16/14 0850 02/18/14 0450  WBC 2.1* 1.4*  --  2.0* 2.8* 2.6*  NEUTROABS 0.8* 0.8*  --  1.1* 1.9  --   HGB 14.0 12.9  --  12.5 13.9 14.1  HCT 40.7 38.0  --  37.2 42.2 42.9  MCV 102.5* 103.0*  --  105.4* 107.7* 109.2*  PLT 37* 26* 23* 33* 35* 47*   Cardiac Enzymes:   No results found for this basename: CKTOTAL, CKMB, CKMBINDEX, TROPONINI,  in the last 168 hours BNP (last 3 results) No results found for this basename: PROBNP,  in the last 8760 hours CBG:  Recent  Labs Lab 02/17/14 1640 02/17/14 2129 02/18/14 0848 02/18/14 0935 02/18/14 1339  GLUCAP 285* 238* 66* 151* 97    Recent Results (from the past 240 hour(s))  CULTURE, BLOOD (ROUTINE X 2)     Status: None   Collection Time    02/14/14  7:20 AM      Result Value Ref Range Status   Specimen Description BLOOD RIGHT HAND   Final   Special Requests BOTTLES DRAWN AEROBIC AND ANAEROBIC 5CC   Final   Culture  Setup Time     Final   Value: 02/14/2014 11:16     Performed at [facility]   Culture     Final   Value:        BLOOD CULTURE RECEIVED NO GROWTH TO DATE CULTURE WILL BE HELD FOR 5 DAYS BEFORE ISSUING A FINAL NEGATIVE REPORT     Performed at [facility]   Report Status PENDING   Incomplete  CULTURE, BLOOD (ROUTINE X 2)     Status: None   Collection Time    02/14/14  7:36 AM      Result Value Ref Range Status   Specimen Description BLOOD LEFT HAND   Final   Special Requests BOTTLES DRAWN AEROBIC AND ANAEROBIC 5CC   Final   Culture  Setup Time     Final   Value: 02/14/2014 11:15     Performed at [facility]   Culture     Final   Value:        BLOOD CULTURE RECEIVED NO GROWTH TO DATE CULTURE WILL BE HELD FOR 5 DAYS BEFORE ISSUING A FINAL NEGATIVE REPORT     Performed at [facility]   Report Status PENDING   Incomplete     Studies: No results found.  Scheduled Meds: . sodium chloride   Intravenous Once  . clotrimazole  10 mg Oral 5 X Daily  . folic acid  1 mg Oral Daily  . insulin aspart  0-15 Units Subcutaneous TID WC  . insulin glargine  18 Units Subcutaneous QHS  . lidocaine-EPINEPHrine  20 mL Infiltration Once  . multivitamin with minerals  1 tablet Oral Daily  . pantoprazole  40 mg Oral Q0600  . polyethylene glycol  17 g Oral Daily  . sertraline  150 mg Oral Daily  . sodium chloride  3 mL Intravenous Q12H  . thiamine  100 mg Oral Daily   Or  . thiamine  100 mg Intravenous Daily    Continuous Infusions: . sodium chloride 0.9 %  1,000 mL with potassium chloride 40 mEq infusion 75 mL/hr at 02/17/14 2110     Time spent: 15 minutes  [facility] Pager [phone]. If 7PM-7AM, please contact night-coverage at [email or web address], password [facility] 02/18/2014, 3:51 PM  LOS: 5 days

## 2014-02-18 NOTE — Brief Op Note (Addendum)
02/13/2014 - 02/18/2014  12:10 PM  PATIENT:  [name]  48 y.o. female  PRE-OPERATIVE DIAGNOSIS:  rectal bleeding  POST-OPERATIVE DIAGNOSIS:  2 sigmoid polyps, varacies  PROCEDURE:  Procedure(s): COLONOSCOPY (N/A)  SURGEON:  Surgeon(s) and Role:    * Gatha Mayer, MD - Primary   ANESTHESIA:   MAC  EBL:  Total I/O In: 500 [I.V.:500] Out: -    1) 2 diminutive sigmoid polyps removed via cold bx 2) small rectal varices 3) otherwise normal, adequate prep 4) colon mucosa was easily traumatized

## 2014-02-18 NOTE — Anesthesia Preprocedure Evaluation (Addendum)
Anesthesia Evaluation  Patient identified by MRN, date of birth, ID band Patient awake    Reviewed: Allergy & Precautions, H&P , NPO status , Patient's Chart, lab work & pertinent test results  Airway Mallampati: I       Dental   Pulmonary former smoker,  breath sounds clear to auscultation        Cardiovascular Rhythm:Regular Rate:Normal     Neuro/Psych Depression    GI/Hepatic PUD, GERD-  Medicated,(+) Hepatitis -, CETOH andHEP C Chirossis, albumin 3.3,    Endo/Other  diabetes, Poorly Controlled, Type 2  Renal/GU      Musculoskeletal   Abdominal   Peds  Hematology  (+) anemia , 10/35 H/H   Anesthesia Other Findings   Reproductive/Obstetrics                            Anesthesia Physical Anesthesia Plan  ASA: III  Anesthesia Plan: MAC   Post-op Pain Management:    Induction: Intravenous  Airway Management Planned: Nasal Cannula  Additional Equipment:   Intra-op Plan:   Post-operative Plan:   Informed Consent: I have reviewed the patients History and Physical, chart, labs and discussed the procedure including the risks, benefits and alternatives for the proposed anesthesia with the patient or authorized representative who has indicated his/her understanding and acceptance.     Plan Discussed with:   Anesthesia Plan Comments:         Anesthesia Quick Evaluation

## 2014-02-18 NOTE — Transfer of Care (Signed)
Immediate Anesthesia Transfer of Care Note  Patient: [name]  Procedure(s) Performed: Procedure(s): COLONOSCOPY (N/A)  Patient Location: Endoscopy Unit  Anesthesia Type:MAC  Level of Consciousness: awake, alert  and oriented  Airway & Oxygen Therapy: Patient Spontanous Breathing and Patient connected to face mask oxygen  Post-op Assessment: Report given to PACU RN, Post -op Vital signs reviewed and stable and Patient moving all extremities X 4  Post vital signs: Reviewed and stable  Complications: No apparent anesthesia complications

## 2014-02-19 LAB — GLUCOSE, CAPILLARY
GLUCOSE-CAPILLARY: 193 mg/dL — AB (ref 70–99)
Glucose-Capillary: 282 mg/dL — ABNORMAL HIGH (ref 70–99)
Glucose-Capillary: 102 mg/dL — ABNORMAL HIGH (ref 70–99)

## 2014-02-19 MED ORDER — INSULIN GLARGINE 100 UNIT/ML ~~LOC~~ SOLN: 20.0000 [IU] | Freq: Every day | SUBCUTANEOUS | Status: DC

## 2014-02-19 MED ORDER — CIPROFLOXACIN HCL 500 MG PO TABS: 500.0000 mg | ORAL_TABLET | Freq: Two times a day (BID) | ORAL | Status: AC

## 2014-02-19 NOTE — Plan of Care (Signed)
Problem: Discharge Progression Outcomes Goal: Pain controlled with appropriate interventions Outcome: Completed/Met Date Met:  02/19/14 Patient's pain remained controlled with PRN medication. Patient initially stated pain level at an 8/10, next pain assessment patient was asleep.

## 2014-02-19 NOTE — Discharge Summary (Signed)
Discharge Summary  [name] ZOX:[id number] DOB: 1965/10/05  PCP: Cathlean Cower, MD  Admit date: 02/13/2014 Discharge date: 02/19/2014  Time spent: 25 minutes  Recommendations for Outpatient Follow-up:  1. New medication: Cipro 500 by mouth twice a day 10 days 2. Medication change: Lantus increased from 15 units to 20 units daily at bedtime   Discharge Diagnoses:  Active Hospital Problems   Diagnosis Date Noted  . Abdominal pain 02/14/2014  . Colon polyps 02/18/2014  . Rectal varices - small 02/18/2014  . GIB (gastrointestinal bleeding) 02/14/2014  . Thrombocytopenia 02/14/2014  . Hypokalemia 02/14/2014  . Alcoholic hepatitis 81/19/1478  . Hematemesis 12/09/2013  . MDD (major depressive disorder) 09/23/2013  . Alcohol dependence 12/17/2012  . Cocaine abuse 12/17/2012  . DM (diabetes mellitus), type 2 09/17/2011  . Cirrhosis 11/09/2010    Resolved Hospital Problems   Diagnosis Date Noted Date Resolved  No resolved problems to display.    Discharge Condition: Improved, being discharged home  Diet recommendation: Car modified low-sodium  Filed Weights   02/16/14 2108 02/17/14 2135 02/18/14 2125  Weight: 59.875 kg (132 lb) 56.7 kg (125 lb) 56.7 kg (125 lb)    History of present illness:  48 year old female with past medical history of ongoing alcohol abuse with secondary cirrhosis and hepatitis C plus diabetes mellitus as well as previous episodes of bleeding gastric ulcers who was admitted on 10/26 for abdominal pain and hematemesis and found to be with softer blood pressures, platelet count of 37 and lactic acid level of 2.3 with Hemoccult positive stool. Patient also recently done cocaine. Started on empiric Rocephin and GI consult. Hemoglobin on admission at 12.9.   Hospital Course:  Principal Problem:   Abdominal pain: Looked to be alcohol gastritis. No signs of acute bleeding ulcer. Initially concerns for spontaneous bacterial peritonitis. Patient put on  prophylactic antibiotics, but with white count normal no fever, distal to be more gastritis. Abdominal CT noted only minute amount of ascites, too small to tap. Upon discharge, patient will be on 10 days of oral Cipro to prevent peritonitis as outpatient Active Problems:   Cirrhosis   DM (diabetes mellitus), type 2: A1c elevated at 10.5. Normally on 15 units of Lantus plus oral medications. This was titrated up and patient will be discharged on 20 units of Lantus    Alcohol dependence: Drug and alcohol use ongoing. Patient counseled.   Cocaine abuse    MDD (major depressive disorder)   Rectal bleeding/Hematemesis: Suspected to be varices. Status post colonoscopy done 10/30 noting only friable mucosa but no signs of active bleeding. Recommend patient use NSAIDs sparingly    Thrombocytopenia: Second to chronic liver disease, leading as mentioned above   Hypokalemia: Replaced   Alcoholic hepatitis   Colon polyps: Incidentally noted on colonoscopy    Procedures:  Status post colonoscopy done 10/30:2 diminutive sigmoid polyps removed via cold biopsy. Small rectal varices noted, easily traumatized colonic mucosa  Consultations:  Gastroenterology  Discharge Exam: BP 104/88  Pulse 68  Temp(Src) 98.4 F (36.9 C) (Oral)  Resp 18  Ht 5\' 3"  (1.6 m)  Wt 56.7 kg (125 lb)  BMI 22.15 kg/m2  SpO2 98%  LMP 11/01/2011  General: Alert and oriented 3, no acute distress Cardiovascular: Regular rate and rhythm, G9-F6, 2/6 systolic ejection murmur Respiratory: Clear to auscultation bilaterally  Discharge Instructions You were cared for by a hospitalist during your hospital stay. If you have any questions about your discharge medications or the care you received while you  were in the hospital after you are discharged, you can call the unit and asked to speak with the hospitalist on call if the hospitalist that took care of you is not available. Once you are discharged, your primary care physician  will handle any further medical issues. Please note that NO REFILLS for any discharge medications will be authorized once you are discharged, as it is imperative that you return to your primary care physician (or establish a relationship with a primary care physician if you do not have one) for your aftercare needs so that they can reassess your need for medications and monitor your lab values.  Discharge Instructions   Diet - low sodium heart healthy    Complete by:  As directed      Increase activity slowly    Complete by:  As directed             Medication List    STOP taking these medications       ibuprofen 200 MG tablet  Commonly known as:  ADVIL,MOTRIN      TAKE these medications       ciprofloxacin 500 MG tablet  Commonly known as:  CIPRO  Take 1 tablet (500 mg total) by mouth 2 (two) times daily.     diphenhydrAMINE 25 MG tablet  Commonly known as:  BENADRYL  Take 50 mg by mouth every 6 (six) hours as needed for sleep.     esomeprazole 40 MG capsule  Commonly known as:  NEXIUM  Take 40 mg by mouth daily as needed (heartburn).     insulin aspart 100 UNIT/ML injection  Commonly known as:  novoLOG  Inject 15 Units into the skin 3 (three) times daily before meals.     insulin glargine 100 UNIT/ML injection  Commonly known as:  LANTUS  Inject 0.2 mLs (20 Units total) into the skin at bedtime.     linagliptin 5 MG Tabs tablet  Commonly known as:  TRADJENTA  Take 1 tablet (5 mg total) by mouth daily. For diabetes     metFORMIN 500 MG tablet  Commonly known as:  GLUCOPHAGE  Take 500 mg by mouth 2 (two) times daily with a meal.     multivitamin with minerals Tabs tablet  Take 1 tablet by mouth daily.     sertraline 100 MG tablet  Commonly known as:  ZOLOFT  Take 150 mg by mouth daily.     VISINE 0.05 % ophthalmic solution  Generic drug:  tetrahydrozoline  Place 1 drop into both eyes every morning.       Allergies  Allergen Reactions  . Betadine  [Povidone Iodine] Hives       Follow-up Information   Follow up with Cathlean Cower, MD. (As needed)    Specialties:  Internal Medicine, Radiology   Contact information:   [place] [address] [phone]       Follow up with Silvano Rusk, MD. Schedule an appointment as soon as possible for a visit in 1 month.   Specialty:  Gastroenterology   Contact information:   [address] [place] [address] [phone]        The results of significant diagnostics from this hospitalization (including imaging, microbiology, ancillary and laboratory) are listed below for reference.    Significant Diagnostic Studies: Nm Hepatobiliary Including Gb  02/14/2014   CLINICAL DATA:  Chronic abdominal pain. History of underlying parenchymal liver disease and hepatitis-C. Known cholelithiasis.  EXAM:  NUCLEAR MEDICINE HEPATOBILIARY IMAGING WITH GALLBLADDER EF  Views:  Anterior, right lateral right upper quadrant  Radionuclide:  Technetium 66m Choletec  Dose:  5.0 mCi  Route of administration: Intravenous  COMPARISON:  Ultrasound right upper quadrant February 14, 2014  FINDINGS: The liver uptake of radiotracer is within normal limits. There is prompt visualization of gallbladder and small bowel, indicating patency of the cystic and common bile ducts. A weight based dose, 1.11 mcg, of CCK was administered intravenously with calculation of the computer generated ejection fraction of radiotracer from the gallbladder. The patient did not experience clinical symptoms with the CCK administration. The computer generated ejection fraction of radiotracer from the gallbladder is abnormally low at 18%, normal greater than 38%.  IMPRESSION: Abnormally low ejection fraction of radiotracer from the gallbladder, a finding indicative of biliary dyskinesia. Cystic and common bile ducts are patent as is evidenced by visualization of gallbladder and small bowel.   Electronically Signed   By: Lowella Grip M.D.   On: 02/14/2014 15:03   US Abdomen Limited  02/14/2014   CLINICAL DATA:  Check for possible ascites for paracentesis  EXAM: LIMITED ABDOMEN ULTRASOUND FOR ASCITES  TECHNIQUE: Limited ultrasound survey for ascites was performed in all four abdominal quadrants.  COMPARISON:  None.  FINDINGS: Limited evaluation of the abdomen in all 4 quadrants shows no evidence of significant ascites.  IMPRESSION: No evidence of significant ascites to allow for paracentesis.   Electronically Signed   By: Inez Catalina M.D.   On: 02/14/2014 15:56   Dg Chest Port 1 View  02/14/2014   CLINICAL DATA:  Mid chest pain, cough.  EXAM: PORTABLE CHEST - 1 VIEW  COMPARISON:  12/24/2013  FINDINGS: No confluent airspace opacity, pleural effusion, or pneumothorax. Cardiomediastinal contours are within normal range. No acute osseous finding.  IMPRESSION: No radiographic evidence of active cardiopulmonary disease.   Electronically Signed   By: Carlos Levering M.D.   On: 02/14/2014 02:45   US Abdomen Limited Ruq  02/14/2014   CLINICAL DATA:  Initial valuation for cirrhosis. No wound gallstones.  EXAM: US ABDOMEN LIMITED - RIGHT UPPER QUADRANT  COMPARISON:  Prior study from 12/24/2013  FINDINGS: Gallbladder:  Several mobile echogenic stones measuring up to 2.8 mm present within the gallbladder lumen. No gallbladder wall thickening. No sonographic Murphy sign elicited on exam.  Common bile duct:  Diameter: 5.4 mm.  Liver:  Liver demonstrates a coarse echogenic echotexture with nodular contour, consistent with cirrhosis. No focal intrahepatic lesions. Ascites present within the right upper quadrant.  IMPRESSION: 1. Cholelithiasis without sonographic evidence for acute cholecystitis. 2. Hepatic cirrhosis. 3. Ascites.   Electronically Signed   By: Jeannine Boga M.D.   On: 02/14/2014 04:22    Microbiology: Recent Results (from the past 240 hour(s))  CULTURE, BLOOD (ROUTINE X 2)     Status: None   Collection Time     02/14/14  7:20 AM      Result Value Ref Range Status   Specimen Description BLOOD RIGHT HAND   Final   Special Requests BOTTLES DRAWN AEROBIC AND ANAEROBIC 5CC   Final   Culture  Setup Time     Final   Value: 02/14/2014 11:16     Performed at [facility]   Culture     Final   Value:        BLOOD CULTURE RECEIVED NO GROWTH TO DATE CULTURE WILL BE HELD FOR 5 DAYS BEFORE ISSUING A FINAL NEGATIVE REPORT  Performed at [facility]   Report Status PENDING   Incomplete  CULTURE, BLOOD (ROUTINE X 2)     Status: None   Collection Time    02/14/14  7:36 AM      Result Value Ref Range Status   Specimen Description BLOOD LEFT HAND   Final   Special Requests BOTTLES DRAWN AEROBIC AND ANAEROBIC 5CC   Final   Culture  Setup Time     Final   Value: 02/14/2014 11:15     Performed at [facility]   Culture     Final   Value:        BLOOD CULTURE RECEIVED NO GROWTH TO DATE CULTURE WILL BE HELD FOR 5 DAYS BEFORE ISSUING A FINAL NEGATIVE REPORT     Performed at [facility]   Report Status PENDING   Incomplete     Labs: Basic Metabolic Panel:  Recent Labs Lab 02/13/14 2108 02/14/14 0740 02/15/14 0512 02/16/14 0610  NA 137 137 134* 134*  K 3.9 3.3* 3.5* 3.5*  CL 94* 94* 91* 91*  CO2 28 23 29 31   GLUCOSE 297* 293* 276* 82  BUN 6 7 7  5*  CREATININE 0.36* 0.34* 0.40* 0.41*  CALCIUM 8.9 8.3* 9.0 9.2  MG  --  1.5 1.4* 1.9   Liver Function Tests:  Recent Labs Lab 02/13/14 2108 02/14/14 0740 02/15/14 0512 02/16/14 0610  AST 205* 164* 128* 114*  ALT 117* 106* 82* 82*  ALKPHOS 165* 144* 133* 138*  BILITOT 1.6* 1.4* 1.5* 1.5*  PROT 8.2 7.5 7.3 7.7  ALBUMIN 3.4* 3.1* 3.3* 3.3*    Recent Labs Lab 02/14/14 0740  LIPASE 31   No results found for this basename: AMMONIA,  in the last 168 hours CBC:  Recent Labs Lab 02/13/14 2108 02/14/14 0740 02/14/14 1700 02/15/14 0512 02/16/14 0850 02/18/14 0450  WBC 2.1* 1.4*  --  2.0* 2.8* 2.6*    NEUTROABS 0.8* 0.8*  --  1.1* 1.9  --   HGB 14.0 12.9  --  12.5 13.9 14.1  HCT 40.7 38.0  --  37.2 42.2 42.9  MCV 102.5* 103.0*  --  105.4* 107.7* 109.2*  PLT 37* 26* 23* 33* 35* 47*   Cardiac Enzymes: No results found for this basename: CKTOTAL, CKMB, CKMBINDEX, TROPONINI,  in the last 168 hours BNP: BNP (last 3 results) No results found for this basename: PROBNP,  in the last 8760 hours CBG:  Recent Labs Lab 02/18/14 1339 02/18/14 1617 02/18/14 2122 02/19/14 0738 02/19/14 1149  GLUCAP 97 277* 182* 193* 282*       Signed:  Venezia Sargeant K  [facility] 02/19/2014, 2:11 PM

## 2014-02-19 NOTE — Progress Notes (Signed)
    S: "I still don't feel good" ate all of her breakfast "that was good"  O:  Filed Vitals:   02/19/14 0550  BP: 139/87  Pulse: 65  Temp: 98.4 F (36.9 C)  Resp: 16   Looks better but still chronically ill  Abd soft but diffusely tender, BS + - similar to before  A/P  1) Cirrhosis w/ ascites and abd pain and thrombocytopenia/leukopenia 2) Gastrits/ulcer disease 3) Alcoholism and drug abuse - active 4) rectal varices/hemorrhoids causing bleeding  Continue Abx - empiric x 10 d total - change to cipro 500 bid or  Levaquin 500 qd at dc Stop drugs and alcohol Call if further ? She can call our office for follow-up after dc

## 2014-02-19 NOTE — Op Note (Signed)
[facility] [place], [address]   COLONOSCOPY PROCEDURE REPORT  PATIENT: [name], [name]  MR#: [id number] BIRTHDATE: 11/10/1965 , 48  yrs. old GENDER: female ENDOSCOPIST: Gatha Mayer, MD, [facility] PROCEDURE DATE:  02/18/2014 PROCEDURE:   Colonoscopy with biopsy  ASA CLASS:   Class III INDICATIONS:rectal bleeding. MEDICATIONS: Monitored anesthesia care and Per Anesthesia  DESCRIPTION OF PROCEDURE:   After the risks benefits and alternatives of the procedure were thoroughly explained, informed consent was obtained.  The digital rectal exam revealed no abnormalities of the rectum.   The Pentax Adult Colon [id number] endoscope was introduced through the anus and advanced to the cecum, which was identified by both the appendix and ileocecal valve. No adverse events experienced.   The quality of the prep was good, using MoviPrep  The instrument was then slowly withdrawn as the colon was fully examined.      COLON FINDINGS: Two sessile polyps ranging from 3 to 10mm in size were found.  A polypectomy was performed with cold forceps.  The resection was complete, the polyp tissue was completely retrieved and sent to histology.  Otherwise normal colon.  Small internal hemorrhoids and rectal varices were found.  Retroflexed views revealed internal hemorrhoids/varices. m].  e].  The scope was withdrawn and the procedure completed. COMPLICATIONS: There were no immediate complications.  ENDOSCOPIC IMPRESSION: 1.   Two sessile polyps ranging from 3 to 71mm in size were found; polypectomy was performed with cold forceps 2.   Small internal hemorrhoids/rectal varices - otherwise NL  RECOMMENDATIONS: ? home soon stop EtOH likely not to have colonoscopy recall due to co-morbidities  eSigned:  Gatha Mayer, MD, [facility] 02/19/2014 4:24 PM

## 2014-02-19 NOTE — Discharge Instructions (Signed)
Cirrhosis  Cirrhosis is a condition of scarring of the liver which is caused when the liver has tried repairing itself following damage. This damage may come from a previous infection such as one of the forms of hepatitis (usually hepatitis C), or the damage may come from being injured by toxins. The main toxin that causes this damage is alcohol. The scarring of the liver from use of alcohol is irreversible. That means the liver cannot return to normal even though alcohol is not used any more. The main danger of hepatitis C infection is that it may cause long-lasting (chronic) liver disease, and this also may lead to cirrhosis. This complication is progressive and irreversible.  CAUSES   Prior to available blood tests, hepatitis C could be contracted by blood transfusions. Since testing of blood has improved, this is now unlikely. This infection can also be contracted through intravenous drug use and the sharing of needles. It can also be contracted through sexual relationships. The injury caused by alcohol comes from too much use. It is not a few drinks that poison the liver, but years of misuse. Usually there will be some signs and symptoms early with scarring of the liver that suggest the development of better habits. Alcohol should never be used while using acetaminophen. A small dose of both taken together may cause irreversible damage to the liver.  HOME CARE INSTRUCTIONS   There is no specific treatment for cirrhosis. However, there are things you can do to avoid making the condition worse.  · Rest as needed.  · Eat a well-balanced diet. Your caregiver can help you with suggestions.  · Vitamin supplements including vitamins A, K, D, and thiamine can help.  · A low-salt diet, water restriction, or diuretic medicine may be needed to reduce fluid retention.  · Avoid alcohol. This can be extremely toxic if combined with acetaminophen.  · Avoid drugs which are toxic to the liver. Some of these include isoniazid,  methyldopa, acetaminophen, anabolic steroids (muscle-building drugs), erythromycin, and oral contraceptives (birth control pills). Check with your caregiver to make sure medicines you are presently taking will not be harmful.  · Periodic blood tests may be required. Follow your caregiver's advice regarding the timing of these.  · Milk thistle is an herbal remedy which does protect the liver against toxins. However, it will not help once the liver has been scarred.  SEEK MEDICAL CARE IF:  · You have increasing fatigue or weakness.  · You develop swelling of the hands, feet, legs, or face.  · You vomit bright red blood, or a coffee ground appearing material.  · You have blood in your stools, or the stools turn black and tarry.  · You have a fever.  · You develop loss of appetite, or have nausea and vomiting.  · You develop jaundice.  · You develop easy bruising or bleeding.  · You have worsening of any of the problems you are concerned about.  Document Released: 04/08/2005 Document Revised: 07/01/2011 Document Reviewed: 11/25/2007  ExitCare® Patient Information ©2015 [employer]. This information is not intended to replace advice given to you by your health care provider. Make sure you discuss any questions you have with your health care provider.

## 2014-02-20 LAB — CULTURE, BLOOD (ROUTINE X 2)
CULTURE: NO GROWTH
Culture: NO GROWTH

## 2014-02-21 ENCOUNTER — Encounter (HOSPITAL_COMMUNITY): Payer: Self-pay | Admitting: Internal Medicine

## 2014-06-10 ENCOUNTER — Other Ambulatory Visit: Payer: Self-pay | Admitting: Internal Medicine

## 2014-06-16 ENCOUNTER — Emergency Department (HOSPITAL_COMMUNITY)
Admission: EM | Admit: 2014-06-16 | Discharge: 2014-06-16 | Discharge status: Against Medical Advice | Payer: Self-pay | Attending: Emergency Medicine | Admitting: Emergency Medicine

## 2014-06-16 DIAGNOSIS — E119 Type 2 diabetes mellitus without complications: Secondary | ICD-10-CM | POA: Insufficient documentation

## 2014-06-16 DIAGNOSIS — F1010 Alcohol abuse, uncomplicated: Secondary | ICD-10-CM | POA: Insufficient documentation

## 2014-06-16 DIAGNOSIS — R1084 Generalized abdominal pain: Secondary | ICD-10-CM | POA: Insufficient documentation

## 2014-06-16 DIAGNOSIS — M549 Dorsalgia, unspecified: Secondary | ICD-10-CM | POA: Insufficient documentation

## 2014-06-16 DIAGNOSIS — G8929 Other chronic pain: Secondary | ICD-10-CM | POA: Insufficient documentation

## 2014-06-16 LAB — CBC
HCT: 43 % (ref 36.0–46.0)
HEMOGLOBIN: 14.6 g/dL (ref 12.0–15.0)
MCH: 36.1 pg — ABNORMAL HIGH (ref 26.0–34.0)
MCHC: 34 g/dL (ref 30.0–36.0)
MCV: 106.4 fL — ABNORMAL HIGH (ref 78.0–100.0)
Platelets: 108 10*3/uL — ABNORMAL LOW (ref 150–400)
RBC: 4.04 MIL/uL (ref 3.87–5.11)
RDW: 14 % (ref 11.5–15.5)
WBC: 5.4 10*3/uL (ref 4.0–10.5)

## 2014-06-16 LAB — COMPREHENSIVE METABOLIC PANEL
ALBUMIN: 3.4 g/dL — AB (ref 3.5–5.2)
ALT: 97 U/L — ABNORMAL HIGH (ref 0–35)
AST: 153 U/L — AB (ref 0–37)
Alkaline Phosphatase: 138 U/L — ABNORMAL HIGH (ref 39–117)
Anion gap: 8 (ref 5–15)
BUN: 7 mg/dL (ref 6–23)
CALCIUM: 9.3 mg/dL (ref 8.4–10.5)
CO2: 28 mmol/L (ref 19–32)
Chloride: 106 mmol/L (ref 96–112)
Creatinine, Ser: 0.4 mg/dL — ABNORMAL LOW (ref 0.50–1.10)
GFR calc Af Amer: >90 mL/min (ref >90)
GFR calc non Af Amer: >90 mL/min (ref >90)
Glucose, Bld: 73 mg/dL (ref 70–99)
POTASSIUM: 3.6 mmol/L (ref 3.5–5.1)
SODIUM: 142 mmol/L (ref 135–145)
TOTAL PROTEIN: 8.5 g/dL — AB (ref 6.0–8.3)
Total Bilirubin: 1.6 mg/dL — ABNORMAL HIGH (ref 0.3–1.2)

## 2014-06-16 LAB — ETHANOL: Alcohol, Ethyl (B): 313 mg/dL — ABNORMAL HIGH (ref 0–9)

## 2014-06-16 LAB — ACETAMINOPHEN LEVEL: Acetaminophen (Tylenol), Serum: <10 ug/mL — ABNORMAL LOW (ref 10–30)

## 2014-06-16 NOTE — ED Notes (Signed)
Dr. Jeneen Rinks went in to assess pt and came out of room and reported that pt was leaving AMA.

## 2014-06-16 NOTE — ED Notes (Signed)
Pt and Husband arguing back and forth, pt tachy during these events.

## 2014-06-16 NOTE — ED Provider Notes (Signed)
As I walk into the room patient states "I've been here for 2 fucking hours I'm leaving" she declines evaluation.  Tanna Furry, MD 06/16/14 2352

## 2014-06-16 NOTE — ED Notes (Signed)
Per EMS pt comes from home c/o back pain x 5 days, pt told EMS that she believes she has cancer.  Per EMS pt is intoxicated.

## 2014-06-16 NOTE — ED Notes (Signed)
Pt reports generalized pain and vomiting x2 months. Reports drinking half a pint of liquor today. Also reports right lower back pain and says "a sharp pain goes up my spine every time I get sick." No active vomiting. No other c/c.

## 2014-07-13 ENCOUNTER — Ambulatory Visit: Payer: Self-pay | Admitting: Internal Medicine

## 2014-07-14 ENCOUNTER — Ambulatory Visit (INDEPENDENT_AMBULATORY_CARE_PROVIDER_SITE_OTHER): Payer: No Typology Code available for payment source | Admitting: Internal Medicine

## 2014-07-14 ENCOUNTER — Encounter: Payer: Self-pay | Admitting: Internal Medicine

## 2014-07-14 VITALS — BP 138/80 | HR 87 | Temp 99.5°F | Resp 18 | Ht 62.0 in | Wt 116.1 lb

## 2014-07-14 DIAGNOSIS — Z0000 Encounter for general adult medical examination without abnormal findings: Secondary | ICD-10-CM | POA: Diagnosis not present

## 2014-07-14 DIAGNOSIS — E119 Type 2 diabetes mellitus without complications: Secondary | ICD-10-CM | POA: Diagnosis not present

## 2014-07-14 MED ORDER — METFORMIN HCL 500 MG PO TABS: ORAL_TABLET | ORAL | Status: DC

## 2014-07-14 MED ORDER — INSULIN GLARGINE 100 UNIT/ML ~~LOC~~ SOLN: 20.0000 [IU] | Freq: Every day | SUBCUTANEOUS | Status: DC

## 2014-07-14 NOTE — Progress Notes (Signed)
Pre visit review using our clinic review tool, if applicable. No additional management support is needed unless otherwise documented below in the visit note. 

## 2014-07-14 NOTE — Patient Instructions (Signed)
OK to increase the lantus to 35 units per day  Please continue all other medications as before, and refills have been done if requested - the ambien  Please have the pharmacy call with any other refills you may need.  Please continue your efforts at being more active, low cholesterol diet, and weight control.  You are otherwise up to date with prevention measures today.  Please keep your appointments with your specialists as you may have planned  Please go to the LAB in the Basement (turn left off the elevator) for the tests to be done on Mon Mar 28  You will be contacted by phone if any changes need to be made immediately.  Otherwise, you will receive a letter about your results with an explanation, but please check with MyChart first.  Please remember to sign up for MyChart if you have not done so, as this will be important to you in the future with finding out test results, communicating by private email, and scheduling acute appointments online when needed.  Please return in 6 months, or sooner if needed, with Lab testing done 3-5 days before

## 2014-07-14 NOTE — Progress Notes (Signed)
Subjective:    Patient ID: [name], female    DOB: 01-01-1966, 49 y.o.   MRN: [id number]  HPI  Here for wellness and f/u;  Overall doing ok;  Pt denies Chest pain, worsening SOB, DOE, wheezing, orthopnea, PND, worsening LE edema, palpitations, dizziness or syncope.  Pt denies neurological change such as new headache, facial or extremity weakness.  Pt denies polydipsia, polyuria, or low sugar symptoms. Pt states overall good compliance with treatment and medications, good tolerability, and has been trying to follow appropriate diet.  Pt denies worsening depressive symptoms, suicidal ideation or panic. No fever, night sweats, wt loss, loss of appetite, or other constitutional symptoms.  Pt states good ability with ADL's, has low fall risk, home safety reviewed and adequate, no other significant changes in hearing or vision, and only occasionally active with exercise. Sugars still over 300, despite taking lantus 25 qhs, and novolog 20 iunits tid with meals,   Cant sleep - needs ambien refill Past Medical History  Diagnosis Date  . Impaired glucose tolerance 11/07/2010  . Hepatitis C approx dx 2005    no tx to date  . Pancreatitis     HX of  . Alcohol dependency     at least [place] admissions 2007- 09/2013 for detox.   Marland Kitchen GERD (gastroesophageal reflux disease)   . Chronic constipation   . Gallstones   . Drug dependency     Hx of cocaine use  . Anxiety and depression 08/2011  . Rheumatoid factor positive   . Hemochromatosis     last phlebotomy tx ~ 2012. liver bx 2006  . Pancytopenia   . Abnormal vaginal bleeding     uterine fibroid  . Gallstone 2006  . Chronic abdominal pain 2011  . Cirrhosis 11/09/2010  . Type II or unspecified type diabetes mellitus without mention of complication, uncontrolled 09/17/2011  . Major depression 11/2012, 09/2013    [facility] admissions for this, suicide attempt by OD Ambien (2014), Prozac (2015),ETOHism    Past Surgical History  Procedure Laterality Date  .  Appendectomy    . Cesarean section  x 2  . Foot surgery  2013  . Abdominal hysterectomy  12/17/2011    Procedure: HYSTERECTOMY ABDOMINAL;  Surgeon: Gus Height, MD;  Location: [facility];  Service: Gynecology;  Laterality: N/A;  . Salpingoophorectomy  12/17/2011    Procedure: SALPINGO OOPHERECTOMY;  Surgeon: Gus Height, MD;  Location: [facility];  Service: Gynecology;  Laterality: Bilateral;  . Esophagogastroduodenoscopy N/A 12/10/2013    Procedure: ESOPHAGOGASTRODUODENOSCOPY (EGD);  Surgeon: Ladene Artist, MD;  Location: [facility];  Service: Endoscopy;  Laterality: N/A;  . Percutaneous liver biopsy  2011  . Colonoscopy N/A 02/18/2014    Procedure: COLONOSCOPY;  Surgeon: Gatha [facility], MD;  Location: [facility];  Service: Endoscopy;  Laterality: N/A;    reports that she quit smoking about 3 years ago. Her smoking use included Cigarettes. She has never used smokeless tobacco. She reports that she drinks alcohol. She reports that she uses illicit drugs. family history includes Cancer in her mother; Diabetes in her father. Allergies  Allergen Reactions  . Betadine [Povidone Iodine] Hives   Current Outpatient Prescriptions on File Prior to Visit  Medication Sig Dispense Refill  . diphenhydrAMINE (BENADRYL) 25 MG tablet Take 50 mg by mouth every 6 (six) hours as needed for sleep.    Marland Kitchen esomeprazole (NEXIUM) 40 MG capsule Take 40 mg by mouth daily as needed (heartburn).    Marland Kitchen ibuprofen (  ADVIL,MOTRIN) 200 MG tablet Take 400 mg by mouth every 6 (six) hours as needed for moderate pain.    Marland Kitchen insulin aspart (NOVOLOG) 100 UNIT/ML injection Inject 15 Units into the skin 3 (three) times daily before meals.     Marland Kitchen linagliptin (TRADJENTA) 5 MG TABS tablet Take 1 tablet (5 mg total) by mouth daily. For diabetes 30 tablet   . Multiple Vitamin (MULTIVITAMIN WITH MINERALS) TABS tablet Take 1 tablet by mouth every other day.     . sertraline (ZOLOFT) 100 MG tablet Take 150 mg by mouth daily.    Marland Kitchen tetrahydrozoline  (VISINE) 0.05 % ophthalmic solution Place 1 drop into both eyes every morning.     No current facility-administered medications on file prior to visit.   Review of Systems Constitutional: Negative for increased diaphoresis, other activity, appetite or siginficant weight change other than noted HENT: Negative for worsening hearing loss, ear pain, facial swelling, mouth sores and neck stiffness.   Eyes: Negative for other worsening pain, redness or visual disturbance.  Respiratory: Negative for shortness of breath and wheezing  Cardiovascular: Negative for chest pain and palpitations.  Gastrointestinal: Negative for diarrhea, blood in stool, abdominal distention or other pain Genitourinary: Negative for hematuria, flank pain or change in urine volume.  Musculoskeletal: Negative for myalgias or other joint complaints.  Skin: Negative for color change and wound or drainage.  Neurological: Negative for syncope and numbness. other than noted Hematological: Negative for adenopathy. or other swelling Psychiatric/Behavioral: Negative for hallucinations, SI, self-injury, decreased concentration or other worsening agitation.      Objective:   Physical Exam BP 138/80 mmHg  Pulse 87  Temp(Src) 99.5 F (37.5 C) (Oral)  Resp 18  Ht 5\' 2"  (1.575 m)  Wt 116 lb 1.3 oz (52.654 kg)  BMI 21.23 kg/m2  SpO2 96%  LMP 11/01/2011 VS noted, not ill appearing Constitutional: Pt is oriented to person, place, and time. Appears well-developed and well-nourished, in no significant distress Head: Normocephalic and atraumatic.  Right Ear: External ear normal.  Left Ear: External ear normal.  Nose: Nose normal.  Mouth/Throat: Oropharynx is clear and moist.  Eyes: Conjunctivae and EOM are normal. Pupils are equal, round, and reactive to light.  Neck: Normal range of motion. Neck supple. No JVD present. No tracheal deviation present or significant neck LA or mass Cardiovascular: Normal rate, regular rhythm,  normal heart sounds and intact distal pulses.   Pulmonary/Chest: Effort normal and breath sounds without rales or wheezing  Abdominal: Soft. Bowel sounds are normal. NT. No HSM  Musculoskeletal: Normal range of motion. Exhibits no edema.  Lymphadenopathy:  Has no cervical adenopathy.  Neurological: Pt is alert and oriented to person, place, and time. Pt has normal reflexes. No cranial nerve deficit. Motor grossly intact Skin: Skin is warm and dry. No rash noted.  Psychiatric:  Has normal mood and affect. Behavior is normal.      Assessment & Plan:

## 2014-07-15 NOTE — Assessment & Plan Note (Signed)

## 2014-07-15 NOTE — Assessment & Plan Note (Signed)
To increase lantus to 35 units, check a1c, consider endo referral

## 2014-07-18 ENCOUNTER — Telehealth: Payer: Self-pay

## 2014-07-18 ENCOUNTER — Other Ambulatory Visit: Payer: Self-pay

## 2014-07-18 ENCOUNTER — Other Ambulatory Visit (INDEPENDENT_AMBULATORY_CARE_PROVIDER_SITE_OTHER): Payer: No Typology Code available for payment source

## 2014-07-18 ENCOUNTER — Encounter (HOSPITAL_COMMUNITY): Payer: Self-pay | Admitting: Emergency Medicine

## 2014-07-18 ENCOUNTER — Ambulatory Visit (HOSPITAL_COMMUNITY)
Admission: RE | Admit: 2014-07-18 | Discharge: 2014-07-18 | Disposition: A | Discharge status: Home / Self Care | Payer: No Typology Code available for payment source | Attending: Psychiatry | Admitting: Psychiatry

## 2014-07-18 ENCOUNTER — Emergency Department (HOSPITAL_COMMUNITY)
Admission: EM | Admit: 2014-07-18 | Discharge: 2014-07-19 | Disposition: A | Discharge status: Other Acute Inpatient Hospital | Payer: No Typology Code available for payment source | Attending: Emergency Medicine | Admitting: Emergency Medicine

## 2014-07-18 DIAGNOSIS — K219 Gastro-esophageal reflux disease without esophagitis: Secondary | ICD-10-CM | POA: Insufficient documentation

## 2014-07-18 DIAGNOSIS — Z883 Allergy status to other anti-infective agents status: Secondary | ICD-10-CM | POA: Diagnosis not present

## 2014-07-18 DIAGNOSIS — E119 Type 2 diabetes mellitus without complications: Secondary | ICD-10-CM | POA: Insufficient documentation

## 2014-07-18 DIAGNOSIS — R1084 Generalized abdominal pain: Secondary | ICD-10-CM | POA: Diagnosis not present

## 2014-07-18 DIAGNOSIS — F419 Anxiety disorder, unspecified: Secondary | ICD-10-CM | POA: Insufficient documentation

## 2014-07-18 DIAGNOSIS — Z87891 Personal history of nicotine dependence: Secondary | ICD-10-CM | POA: Insufficient documentation

## 2014-07-18 DIAGNOSIS — E1165 Type 2 diabetes mellitus with hyperglycemia: Secondary | ICD-10-CM | POA: Diagnosis not present

## 2014-07-18 DIAGNOSIS — Z0000 Encounter for general adult medical examination without abnormal findings: Secondary | ICD-10-CM

## 2014-07-18 DIAGNOSIS — Z794 Long term (current) use of insulin: Secondary | ICD-10-CM | POA: Insufficient documentation

## 2014-07-18 DIAGNOSIS — R45850 Homicidal ideations: Secondary | ICD-10-CM

## 2014-07-18 DIAGNOSIS — F329 Major depressive disorder, single episode, unspecified: Secondary | ICD-10-CM | POA: Diagnosis present

## 2014-07-18 DIAGNOSIS — F10129 Alcohol abuse with intoxication, unspecified: Secondary | ICD-10-CM | POA: Insufficient documentation

## 2014-07-18 DIAGNOSIS — Z862 Personal history of diseases of the blood and blood-forming organs and certain disorders involving the immune mechanism: Secondary | ICD-10-CM | POA: Insufficient documentation

## 2014-07-18 DIAGNOSIS — Z8742 Personal history of other diseases of the female genital tract: Secondary | ICD-10-CM | POA: Insufficient documentation

## 2014-07-18 DIAGNOSIS — F1010 Alcohol abuse, uncomplicated: Secondary | ICD-10-CM

## 2014-07-18 DIAGNOSIS — Z0189 Encounter for other specified special examinations: Secondary | ICD-10-CM

## 2014-07-18 DIAGNOSIS — F1020 Alcohol dependence, uncomplicated: Secondary | ICD-10-CM | POA: Diagnosis not present

## 2014-07-18 DIAGNOSIS — K7030 Alcoholic cirrhosis of liver without ascites: Secondary | ICD-10-CM | POA: Insufficient documentation

## 2014-07-18 DIAGNOSIS — Y908 Blood alcohol level of 240 mg/100 ml or more: Secondary | ICD-10-CM | POA: Diagnosis not present

## 2014-07-18 DIAGNOSIS — Z79899 Other long term (current) drug therapy: Secondary | ICD-10-CM | POA: Diagnosis not present

## 2014-07-18 DIAGNOSIS — B1920 Unspecified viral hepatitis C without hepatic coma: Secondary | ICD-10-CM | POA: Diagnosis not present

## 2014-07-18 DIAGNOSIS — G8929 Other chronic pain: Secondary | ICD-10-CM | POA: Diagnosis not present

## 2014-07-18 DIAGNOSIS — Z8619 Personal history of other infectious and parasitic diseases: Secondary | ICD-10-CM | POA: Insufficient documentation

## 2014-07-18 DIAGNOSIS — R739 Hyperglycemia, unspecified: Secondary | ICD-10-CM

## 2014-07-18 LAB — RAPID URINE DRUG SCREEN, HOSP PERFORMED
Amphetamines: NOT DETECTED
BENZODIAZEPINES: NOT DETECTED
Barbiturates: NOT DETECTED
COCAINE: NOT DETECTED
Opiates: NOT DETECTED
Tetrahydrocannabinol: NOT DETECTED

## 2014-07-18 LAB — URINE MICROSCOPIC-ADD ON

## 2014-07-18 LAB — COMPREHENSIVE METABOLIC PANEL
ALBUMIN: 3.7 g/dL (ref 3.5–5.2)
ALK PHOS: 168 U/L — AB (ref 39–117)
ALT: 115 U/L — ABNORMAL HIGH (ref 0–35)
AST: 168 U/L — AB (ref 0–37)
Anion gap: 13 (ref 5–15)
BILIRUBIN TOTAL: 1.8 mg/dL — AB (ref 0.3–1.2)
BUN: 5 mg/dL — ABNORMAL LOW (ref 6–23)
CHLORIDE: 95 mmol/L — AB (ref 96–112)
CO2: 31 mmol/L (ref 19–32)
Calcium: 8.9 mg/dL (ref 8.4–10.5)
Creatinine, Ser: 0.55 mg/dL (ref 0.50–1.10)
Glucose, Bld: 420 mg/dL — ABNORMAL HIGH (ref 70–99)
POTASSIUM: 3.4 mmol/L — AB (ref 3.5–5.1)
Sodium: 139 mmol/L (ref 135–145)
Total Protein: 8.4 g/dL — ABNORMAL HIGH (ref 6.0–8.3)

## 2014-07-18 LAB — CBC WITH DIFFERENTIAL/PLATELET
Basophils Absolute: 0 10*3/uL (ref 0.0–0.1)
Basophils Relative: 0.7 % (ref 0.0–3.0)
EOS ABS: 0 10*3/uL (ref 0.0–0.7)
Eosinophils Relative: 1.8 % (ref 0.0–5.0)
HCT: 40 % (ref 36.0–46.0)
HEMOGLOBIN: 13.8 g/dL (ref 12.0–15.0)
LYMPHS ABS: 0.8 10*3/uL (ref 0.7–4.0)
Lymphocytes Relative: 30.4 % (ref 12.0–46.0)
MCHC: 34.6 g/dL (ref 30.0–36.0)
MCV: 104.8 fl — ABNORMAL HIGH (ref 78.0–100.0)
MONOS PCT: 12.8 % — AB (ref 3.0–12.0)
Monocytes Absolute: 0.3 10*3/uL (ref 0.1–1.0)
NEUTROS ABS: 1.4 10*3/uL (ref 1.4–7.7)
Neutrophils Relative %: 54.3 % (ref 43.0–77.0)
Platelets: 60 10*3/uL — ABNORMAL LOW (ref 150.0–400.0)
RBC: 3.81 Mil/uL — AB (ref 3.87–5.11)
RDW: 15.1 % (ref 11.5–15.5)
WBC: 2.5 10*3/uL — ABNORMAL LOW (ref 4.0–10.5)

## 2014-07-18 LAB — CBC
HCT: 42.5 % (ref 36.0–46.0)
Hemoglobin: 14.3 g/dL (ref 12.0–15.0)
MCH: 35.9 pg — AB (ref 26.0–34.0)
MCHC: 33.6 g/dL (ref 30.0–36.0)
MCV: 106.8 fL — AB (ref 78.0–100.0)
Platelets: 69 10*3/uL — ABNORMAL LOW (ref 150–400)
RBC: 3.98 MIL/uL (ref 3.87–5.11)
RDW: 14 % (ref 11.5–15.5)
WBC: 3.6 10*3/uL — ABNORMAL LOW (ref 4.0–10.5)

## 2014-07-18 LAB — LIPID PANEL
Cholesterol: 172 mg/dL (ref 0–200)
HDL: 58.1 mg/dL (ref >39.00)
LDL Cholesterol: 95 mg/dL (ref 0–99)
NONHDL: 113.9
TRIGLYCERIDES: 96 mg/dL (ref 0.0–149.0)
Total CHOL/HDL Ratio: 3
VLDL: 19.2 mg/dL (ref 0.0–40.0)

## 2014-07-18 LAB — URINALYSIS, ROUTINE W REFLEX MICROSCOPIC
BILIRUBIN URINE: NEGATIVE
Bilirubin Urine: NEGATIVE
KETONES UR: NEGATIVE
Ketones, ur: NEGATIVE mg/dL
LEUKOCYTES UA: NEGATIVE
Leukocytes, UA: NEGATIVE
Nitrite: NEGATIVE
Nitrite: NEGATIVE
PH: 6 (ref 5.0–8.0)
PH: 6.5 (ref 5.0–8.0)
Protein, ur: NEGATIVE mg/dL
Specific Gravity, Urine: <=1.005 — AB (ref 1.000–1.030)
Specific Gravity, Urine: 1.035 — ABNORMAL HIGH (ref 1.005–1.030)
Total Protein, Urine: NEGATIVE
Urobilinogen, UA: 0.2 (ref 0.0–1.0)
Urobilinogen, UA: 1 mg/dL (ref 0.0–1.0)

## 2014-07-18 LAB — BASIC METABOLIC PANEL
BUN: 6 mg/dL (ref 6–23)
CALCIUM: 8.9 mg/dL (ref 8.4–10.5)
CHLORIDE: 93 meq/L — AB (ref 96–112)
CO2: 26 mEq/L (ref 19–32)
Creatinine, Ser: 0.63 mg/dL (ref 0.40–1.20)
GFR: 106.76 mL/min (ref >60.00)
Glucose, Bld: 648 mg/dL (ref 70–99)
Potassium: 3.8 mEq/L (ref 3.5–5.1)
Sodium: 130 mEq/L — ABNORMAL LOW (ref 135–145)

## 2014-07-18 LAB — SALICYLATE LEVEL: Salicylate Lvl: <4 mg/dL (ref 2.8–20.0)

## 2014-07-18 LAB — HEMOGLOBIN A1C: Hgb A1c MFr Bld: 10 % — ABNORMAL HIGH (ref 4.6–6.5)

## 2014-07-18 LAB — HEPATIC FUNCTION PANEL
ALBUMIN: 3.3 g/dL — AB (ref 3.5–5.2)
ALT: 104 U/L — AB (ref 0–35)
AST: 146 U/L — AB (ref 0–37)
Alkaline Phosphatase: 157 U/L — ABNORMAL HIGH (ref 39–117)
BILIRUBIN TOTAL: 1.8 mg/dL — AB (ref 0.2–1.2)
Bilirubin, Direct: 1 mg/dL — ABNORMAL HIGH (ref 0.0–0.3)
TOTAL PROTEIN: 7.9 g/dL (ref 6.0–8.3)

## 2014-07-18 LAB — CBG MONITORING, ED
GLUCOSE-CAPILLARY: 446 mg/dL — AB (ref 70–99)
GLUCOSE-CAPILLARY: 294 mg/dL — AB (ref 70–99)
Glucose-Capillary: 358 mg/dL — ABNORMAL HIGH (ref 70–99)
Glucose-Capillary: 371 mg/dL — ABNORMAL HIGH (ref 70–99)

## 2014-07-18 LAB — MICROALBUMIN / CREATININE URINE RATIO
CREATININE, U: 14.4 mg/dL
Microalb Creat Ratio: 4.8 mg/g (ref 0.0–30.0)

## 2014-07-18 LAB — ETHANOL: ALCOHOL ETHYL (B): 362 mg/dL — AB (ref 0–9)

## 2014-07-18 LAB — ACETAMINOPHEN LEVEL

## 2014-07-18 LAB — TSH: TSH: 0.61 u[IU]/mL (ref 0.35–4.50)

## 2014-07-18 MED ORDER — MORPHINE SULFATE 4 MG/ML IJ SOLN
4.0000 mg | Freq: Once | INTRAMUSCULAR | Status: AC
  Administered 2014-07-18: 4 mg via INTRAVENOUS
  Filled 2014-07-18: qty 1

## 2014-07-18 MED ORDER — ONDANSETRON HCL 4 MG/2ML IJ SOLN
4.0000 mg | Freq: Once | INTRAMUSCULAR | Status: AC
  Administered 2014-07-18: 4 mg via INTRAVENOUS
  Filled 2014-07-18: qty 2

## 2014-07-18 MED ORDER — INSULIN ASPART 100 UNIT/ML ~~LOC~~ SOLN
10.0000 [IU] | Freq: Once | SUBCUTANEOUS | Status: AC
  Administered 2014-07-18: 10 [IU] via INTRAVENOUS
  Filled 2014-07-18: qty 1

## 2014-07-18 MED ORDER — ZOLPIDEM TARTRATE 10 MG PO TABS: 10.0000 mg | ORAL_TABLET | Freq: Every evening | ORAL | Status: AC | PRN

## 2014-07-18 MED ORDER — INSULIN ASPART 100 UNIT/ML ~~LOC~~ SOLN
15.0000 [IU] | Freq: Three times a day (TID) | SUBCUTANEOUS | Status: DC
  Administered 2014-07-19 (×2): 15 [IU] via SUBCUTANEOUS
  Filled 2014-07-18: qty 1

## 2014-07-18 MED ORDER — SODIUM CHLORIDE 0.9 % IV BOLUS (SEPSIS)
1000.0000 mL | Freq: Once | INTRAVENOUS | Status: AC
  Administered 2014-07-18: 1000 mL via INTRAVENOUS

## 2014-07-18 MED ORDER — PANTOPRAZOLE SODIUM 40 MG PO TBEC
40.0000 mg | DELAYED_RELEASE_TABLET | Freq: Every day | ORAL | Status: DC
  Administered 2014-07-19: 40 mg via ORAL
  Filled 2014-07-18: qty 1

## 2014-07-18 MED ORDER — OXYCODONE HCL 5 MG PO TABS
5.0000 mg | ORAL_TABLET | Freq: Once | ORAL | Status: AC
  Administered 2014-07-18: 5 mg via ORAL
  Filled 2014-07-18: qty 1

## 2014-07-18 MED ORDER — INSULIN GLARGINE 100 UNIT/ML ~~LOC~~ SOLN
20.0000 [IU] | Freq: Every day | SUBCUTANEOUS | Status: DC
  Administered 2014-07-18: 20 [IU] via SUBCUTANEOUS
  Filled 2014-07-18: qty 0.2

## 2014-07-18 MED ORDER — SODIUM CHLORIDE 0.9 % IV BOLUS (SEPSIS)
1000.0000 mL | Freq: Once | INTRAVENOUS | Status: AC
Start: 2014-07-18 — End: 2014-07-18
  Administered 2014-07-18: 1000 mL via INTRAVENOUS

## 2014-07-18 MED ORDER — SERTRALINE HCL 50 MG PO TABS
150.0000 mg | ORAL_TABLET | Freq: Every day | ORAL | Status: DC
  Administered 2014-07-19: 150 mg via ORAL
  Filled 2014-07-18: qty 3

## 2014-07-18 MED ORDER — METFORMIN HCL 500 MG PO TABS
500.0000 mg | ORAL_TABLET | Freq: Two times a day (BID) | ORAL | Status: DC
  Administered 2014-07-19: 500 mg via ORAL
  Filled 2014-07-18 (×3): qty 1

## 2014-07-18 MED ORDER — LINAGLIPTIN 5 MG PO TABS
5.0000 mg | ORAL_TABLET | Freq: Every day | ORAL | Status: DC
  Administered 2014-07-19: 5 mg via ORAL
  Filled 2014-07-18 (×2): qty 1

## 2014-07-18 MED ORDER — INSULIN ASPART 100 UNIT/ML ~~LOC~~ SOLN: 20.0000 [IU] | Freq: Three times a day (TID) | SUBCUTANEOUS | Status: DC

## 2014-07-18 NOTE — Telephone Encounter (Signed)
Referral done to [facility] for [facility]

## 2014-07-18 NOTE — ED Notes (Signed)
Per [facility] pt does not need another consult...klj
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
## 2014-07-18 NOTE — Telephone Encounter (Signed)
Lab called with critical lab value A1c 648, patient is seen by Dr. [name], advised dr Elna Breslow and sent phone note

## 2014-07-18 NOTE — Telephone Encounter (Signed)
Attempted second call went unanswered. Will defer to PCP for further management.
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
## 2014-07-18 NOTE — Telephone Encounter (Signed)
Attempted to call patient regarding her blood sugar, however her cell phone number was disconnected and there was no answer/machine to leave a message on her home phone. Will try again.

## 2014-07-18 NOTE — Telephone Encounter (Signed)
Received request from [facility] [phone]. Do not see on patient's current medication list

## 2014-07-18 NOTE — ED Provider Notes (Signed)
CSN: [id number]     Arrival date & time 07/18/14  1635 History   First MD Initiated Contact with Patient 07/18/14 1712     Chief Complaint  Patient presents with  . detox    . Homicidal     (Consider location/radiation/quality/duration/timing/severity/associated sxs/prior Treatment) HPI   LEVEL V caveat- alcohol intoxication. PCP: Cathlean Cower, MD Blood pressure 112/78, pulse 110, temperature 98.4 F (36.9 C), temperature source Oral, resp. rate 20, last menstrual period 11/01/2011, SpO2 95 %.  [name] is a 49 y.o.female with a significant PMH of hepatitis, pancreatitis, ETOH dependency, gallstones, drug dependency, rheumatoid factor positive, hemochromatosis, pancytopenia, drug dependency, cirrhosis, diabetes, presents to the ER with complaints of homicidal ideation- [name] plans to kill her husband, alcohol intoxication and abdominal pain. [name] has upper abdominal, no vomiting and no fever. [name] also has elevated glucose, [name] has been drinking and not compliant with her medications. [name] was evaluated by Velna Hatchet with [facility] prior to arrival and does not need another evaluation per report.    Past Medical History  Diagnosis Date  . Impaired glucose tolerance 11/07/2010  . Hepatitis C approx dx 2005    no tx to date  . Pancreatitis     HX of  . Alcohol dependency     at least [place] admissions 2007- 09/2013 for detox.   Marland Kitchen GERD (gastroesophageal reflux disease)   . Chronic constipation   . Gallstones   . Drug dependency     Hx of cocaine use  . Anxiety and depression 08/2011  . Rheumatoid factor positive   . Hemochromatosis     last phlebotomy tx ~ 2012. liver bx 2006  . Pancytopenia   . Abnormal vaginal bleeding     uterine fibroid  . Gallstone 2006  . Chronic abdominal pain 2011  . Cirrhosis 11/09/2010  . Type II or unspecified type diabetes mellitus without mention of complication, uncontrolled 09/17/2011  . Major depression 11/2012, 09/2013    [facility] admissions for this, suicide  attempt by OD Ambien (2014), Prozac (2015),ETOHism    Past Surgical History  Procedure Laterality Date  . Appendectomy    . Cesarean section  x 2  . Foot surgery  2013  . Abdominal hysterectomy  12/17/2011    Procedure: HYSTERECTOMY ABDOMINAL;  Surgeon: Gus Height, MD;  Location: [place] [facility];  Service: Gynecology;  Laterality: N/A;  . Salpingoophorectomy  12/17/2011    Procedure: SALPINGO OOPHERECTOMY;  Surgeon: Gus Height, MD;  Location: [place] [facility];  Service: Gynecology;  Laterality: Bilateral;  . Esophagogastroduodenoscopy N/A 12/10/2013    Procedure: ESOPHAGOGASTRODUODENOSCOPY (EGD);  Surgeon: Ladene Artist, MD;  Location: [facility];  Service: Endoscopy;  Laterality: N/A;  . Percutaneous liver biopsy  2011  . Colonoscopy N/A 02/18/2014    Procedure: COLONOSCOPY;  Surgeon: Gatha Mayer, MD;  Location: [place];  Service: Endoscopy;  Laterality: N/A;   Family History  Problem Relation Age of Onset  . Cancer Mother     ovarian  . Diabetes Father    History  Substance Use Topics  . Smoking status: Former Smoker    Types: Cigarettes    Quit date: 12/09/2010  . Smokeless tobacco: Never Used  . Alcohol Use: Yes     Comment: "drink every other day"   OB History    Gravida Para Term Preterm AB TAB SAB Ectopic Multiple Living   '2 2 2       3     ' Review of Systems  LEVEL V caveat- alcohol intoxication.  Allergies  Betadine  Home Medications   Prior to Admission medications   Medication Sig Start Date End Date Taking? Authorizing Provider  diphenhydrAMINE (BENADRYL) 25 MG tablet Take 50 mg by mouth every 6 (six) hours as needed for sleep.   Yes Historical Provider, MD  esomeprazole (NEXIUM) 40 MG capsule Take 40 mg by mouth daily as needed (heartburn).   Yes Historical Provider, MD  insulin aspart (NOVOLOG) 100 UNIT/ML injection Inject 20 Units into the skin 3 (three) times daily before meals.   Yes Historical Provider, MD  insulin glargine (LANTUS) 100 UNIT/ML injection  Inject 0.2 mLs (20 Units total) into the skin at bedtime. Patient taking differently: Inject 30 Units into the skin at bedtime.  07/14/14  Yes Biagio Borg, MD  linagliptin (TRADJENTA) 5 MG TABS tablet Take 1 tablet (5 mg total) by mouth daily. For diabetes 09/28/13  Yes Encarnacion Slates, NP  metFORMIN (GLUCOPHAGE) 500 MG tablet TAKE 1 TABLET (500 MG TOTAL) BY MOUTH 2 (TWO) TIMES DAILY WITH A MEAL. 07/14/14  Yes Biagio Borg, MD  Multiple Vitamin (MULTIVITAMIN WITH MINERALS) TABS tablet Take 1 tablet by mouth daily.    Yes Historical Provider, MD  sertraline (ZOLOFT) 100 MG tablet Take 150 mg by mouth daily.   Yes Historical Provider, MD  ibuprofen (ADVIL,MOTRIN) 200 MG tablet Take 400 mg by mouth every 6 (six) hours as needed for moderate pain.    Historical Provider, MD  insulin aspart (NOVOLOG) 100 UNIT/ML injection Inject 15 Units into the skin 3 (three) times daily before meals.     Historical Provider, MD  tetrahydrozoline (VISINE) 0.05 % ophthalmic solution Place 1 drop into both eyes every morning.    Historical Provider, MD  zolpidem (AMBIEN) 10 MG tablet Take 1 tablet (10 mg total) by mouth at bedtime as needed for sleep. 07/18/14 08/17/14  Biagio Borg, MD   BP 112/78 mmHg  Pulse 110  Temp(Src) 98.4 F (36.9 C) (Oral)  Resp 20  SpO2 95%  LMP 11/01/2011 Physical Exam  Constitutional: [name] appears well-developed and well-nourished. No distress.  HENT:  Head: Normocephalic and atraumatic.  Eyes: Pupils are equal, round, and reactive to light.  Neck: Normal range of motion. Neck supple.  Cardiovascular: Normal rate and regular rhythm.   Pulmonary/Chest: Effort normal.  Abdominal: Soft. Bowel sounds are normal. [name] exhibits no distension. There is tenderness (diffuse, worse at the RUQ). There is no rigidity, no rebound and no guarding.  Neurological: [name] is alert.  Skin: Skin is warm and dry.  Psychiatric: Her speech is normal and behavior is normal. Her mood appears anxious. [name] exhibits a  depressed mood. [name] expresses homicidal ideation. [name] expresses no suicidal ideation. [name] expresses homicidal plans. [name] expresses no suicidal plans.  Pt intoxicated  Nursing note and vitals reviewed.   ED Course  Procedures (including critical care time) Labs Review Labs Reviewed  ACETAMINOPHEN LEVEL - Abnormal; Notable for the following:    Acetaminophen (Tylenol), Serum <10.0 (*)    All other components within normal limits  CBC - Abnormal; Notable for the following:    WBC 3.6 (*)    MCV 106.8 (*)    MCH 35.9 (*)    Platelets 69 (*)    All other components within normal limits  COMPREHENSIVE METABOLIC PANEL - Abnormal; Notable for the following:    Potassium 3.4 (*)    Chloride 95 (*)    Glucose, Bld 420 (*)  BUN 5 (*)    Total Protein 8.4 (*)    AST 168 (*)    ALT 115 (*)    Alkaline Phosphatase 168 (*)    Total Bilirubin 1.8 (*)    All other components within normal limits  ETHANOL - Abnormal; Notable for the following:    Alcohol, Ethyl (B) 362 (*)    All other components within normal limits  URINALYSIS, ROUTINE W REFLEX MICROSCOPIC - Abnormal; Notable for the following:    Specific Gravity, Urine 1.035 (*)    Glucose, UA >1000 (*)    Hgb urine dipstick SMALL (*)    All other components within normal limits  URINE MICROSCOPIC-ADD ON - Abnormal; Notable for the following:    Bacteria, UA FEW (*)    All other components within normal limits  CBG MONITORING, ED - Abnormal; Notable for the following:    Glucose-Capillary 446 (*)    All other components within normal limits  SALICYLATE LEVEL  URINE RAPID DRUG SCREEN (HOSP PERFORMED)    Imaging Review No results found.   EKG Interpretation None      MDM   Final diagnoses:  ETOH abuse  Homicidal ideations  Hyperglycemia    Medications  insulin aspart (novoLOG) injection 15 Units (not administered)  pantoprazole (PROTONIX) EC tablet 40 mg (not administered)  insulin aspart (novoLOG) injection 20  Units (not administered)  insulin glargine (LANTUS) injection 20 Units (20 Units Subcutaneous Given 07/18/14 2258)  linagliptin (TRADJENTA) tablet 5 mg (not administered)  metFORMIN (GLUCOPHAGE) tablet 500 mg (not administered)  sertraline (ZOLOFT) tablet 150 mg (not administered)  oxyCODONE (Oxy IR/ROXICODONE) immediate release tablet 5 mg (5 mg Oral Given 07/18/14 1829)  sodium chloride 0.9 % bolus 1,000 mL (0 mLs Intravenous Stopped 07/18/14 1953)  sodium chloride 0.9 % bolus 1,000 mL (0 mLs Intravenous Stopped 07/18/14 1952)  insulin aspart (novoLOG) injection 10 Units (10 Units Intravenous Given 07/18/14 1852)  morphine 4 MG/ML injection 4 mg (4 mg Intravenous Given 07/18/14 2212)  ondansetron (ZOFRAN) injection 4 mg (4 mg Intravenous Given 07/18/14 2212)  sodium chloride 0.9 % bolus 1,000 mL (0 mLs Intravenous Stopped 07/18/14 2336)  insulin aspart (novoLOG) injection 10 Units (10 Units Intravenous Given 07/18/14 2357)    The patients sugar improved to 294 in the ED after multiple liters of fluids and Insulin. Her Urinalysis is negative for infection.  Negative Tylenol and Salicylate. Negative UDS.  CMP shows low elevated AST, ALK Phos and ALT and Bilirubin- close to her baseline however. Her Lipase is elevated at 105 which is higher than her normal but not extremely elevated. Will get abd [place] to r/o cholecystitis.   At end of shift pt hand off to [employer].  [name] needs to get an abdominal US to r/o cholecystitis. Her glucose to be < 300 to be medically cleared. [name] drinks frequently and therefore will be on CIWA protocol. The plan is for her to be observed overnight and go to [facility] in the morning (per [facility] note). Pt currently sleeping, resting quietly and in no acute distress.  Filed Vitals:   07/18/14 2330  BP: 115/72  Pulse: 106  Temp:   Resp: [address], PA-C 07/19/14 0134  Serita Grit, MD 07/19/14 [phone]

## 2014-07-18 NOTE — Telephone Encounter (Signed)
Ok for [name] to try one more time to contact pt, ask if pt is taking all of her medications, and how her sugars have been in the last 3 days   I will refer to endocrinology due to chronic severe uncontrolled DM

## 2014-07-19 ENCOUNTER — Emergency Department (HOSPITAL_COMMUNITY): Payer: No Typology Code available for payment source

## 2014-07-19 ENCOUNTER — Inpatient Hospital Stay (HOSPITAL_COMMUNITY)
Admission: AD | Admit: 2014-07-19 | Discharge: 2014-07-20 | DRG: 999 | Disposition: A | Discharge status: Home / Self Care | Payer: No Typology Code available for payment source | Source: Ambulatory Visit | Attending: Internal Medicine | Admitting: Internal Medicine

## 2014-07-19 ENCOUNTER — Encounter (HOSPITAL_COMMUNITY): Payer: Self-pay | Admitting: *Deleted

## 2014-07-19 DIAGNOSIS — F332 Major depressive disorder, recurrent severe without psychotic features: Secondary | ICD-10-CM | POA: Diagnosis present

## 2014-07-19 DIAGNOSIS — Z029 Encounter for administrative examinations, unspecified: Principal | ICD-10-CM

## 2014-07-19 DIAGNOSIS — R1110 Vomiting, unspecified: Secondary | ICD-10-CM

## 2014-07-19 DIAGNOSIS — E119 Type 2 diabetes mellitus without complications: Secondary | ICD-10-CM

## 2014-07-19 DIAGNOSIS — F1020 Alcohol dependence, uncomplicated: Secondary | ICD-10-CM | POA: Diagnosis present

## 2014-07-19 DIAGNOSIS — F1029 Alcohol dependence with unspecified alcohol-induced disorder: Secondary | ICD-10-CM

## 2014-07-19 DIAGNOSIS — R109 Unspecified abdominal pain: Secondary | ICD-10-CM | POA: Diagnosis present

## 2014-07-19 DIAGNOSIS — R112 Nausea with vomiting, unspecified: Secondary | ICD-10-CM

## 2014-07-19 DIAGNOSIS — K7010 Alcoholic hepatitis without ascites: Secondary | ICD-10-CM

## 2014-07-19 DIAGNOSIS — R1011 Right upper quadrant pain: Secondary | ICD-10-CM

## 2014-07-19 LAB — CBG MONITORING, ED
GLUCOSE-CAPILLARY: 126 mg/dL — AB (ref 70–99)
Glucose-Capillary: 255 mg/dL — ABNORMAL HIGH (ref 70–99)

## 2014-07-19 LAB — COMPREHENSIVE METABOLIC PANEL
ALBUMIN: 2.9 g/dL — AB (ref 3.5–5.2)
ALT: 93 U/L — ABNORMAL HIGH (ref 0–35)
AST: 172 U/L — AB (ref 0–37)
Alkaline Phosphatase: 143 U/L — ABNORMAL HIGH (ref 39–117)
Anion gap: 8 (ref 5–15)
BUN: 7 mg/dL (ref 6–23)
CALCIUM: 8.7 mg/dL (ref 8.4–10.5)
CO2: 29 mmol/L (ref 19–32)
Chloride: 96 mmol/L (ref 96–112)
Creatinine, Ser: 0.36 mg/dL — ABNORMAL LOW (ref 0.50–1.10)
GFR calc Af Amer: >90 mL/min (ref >90)
Glucose, Bld: 168 mg/dL — ABNORMAL HIGH (ref 70–99)
Potassium: 3.6 mmol/L (ref 3.5–5.1)
SODIUM: 133 mmol/L — AB (ref 135–145)
Total Bilirubin: 2.6 mg/dL — ABNORMAL HIGH (ref 0.3–1.2)
Total Protein: 7.4 g/dL (ref 6.0–8.3)

## 2014-07-19 LAB — CBC WITH DIFFERENTIAL/PLATELET
BASOS ABS: 0 10*3/uL (ref 0.0–0.1)
Basophils Relative: 1 % (ref 0–1)
EOS PCT: 1 % (ref 0–5)
Eosinophils Absolute: 0 10*3/uL (ref 0.0–0.7)
HEMATOCRIT: 40.1 % (ref 36.0–46.0)
HEMOGLOBIN: 13.8 g/dL (ref 12.0–15.0)
LYMPHS PCT: 27 % (ref 12–46)
Lymphs Abs: 0.7 10*3/uL (ref 0.7–4.0)
MCH: 36.4 pg — AB (ref 26.0–34.0)
MCHC: 34.4 g/dL (ref 30.0–36.0)
MCV: 105.8 fL — ABNORMAL HIGH (ref 78.0–100.0)
Monocytes Absolute: 0.3 10*3/uL (ref 0.1–1.0)
Monocytes Relative: 14 % — ABNORMAL HIGH (ref 3–12)
Neutro Abs: 1.4 10*3/uL — ABNORMAL LOW (ref 1.7–7.7)
Neutrophils Relative %: 58 % (ref 43–77)
Platelets: 39 10*3/uL — ABNORMAL LOW (ref 150–400)
RBC: 3.79 MIL/uL — ABNORMAL LOW (ref 3.87–5.11)
RDW: 14 % (ref 11.5–15.5)
WBC: 2.4 10*3/uL — AB (ref 4.0–10.5)

## 2014-07-19 LAB — LIPASE, BLOOD
LIPASE: 105 U/L — AB (ref 11–59)
Lipase: 53 U/L (ref 11–59)

## 2014-07-19 LAB — GLUCOSE, CAPILLARY
GLUCOSE-CAPILLARY: 108 mg/dL — AB (ref 70–99)
GLUCOSE-CAPILLARY: 181 mg/dL — AB (ref 70–99)

## 2014-07-19 MED ORDER — LORAZEPAM 1 MG PO TABS: 0.0000 mg | ORAL_TABLET | Freq: Two times a day (BID) | ORAL | Status: DC

## 2014-07-19 MED ORDER — SERTRALINE HCL 50 MG PO TABS
150.0000 mg | ORAL_TABLET | Freq: Every day | ORAL | Status: DC
  Filled 2014-07-19 (×2): qty 1

## 2014-07-19 MED ORDER — NICOTINE 21 MG/24HR TD PT24: 21.0000 mg | MEDICATED_PATCH | Freq: Every day | TRANSDERMAL | Status: DC

## 2014-07-19 MED ORDER — FENTANYL CITRATE 0.05 MG/ML IJ SOLN
50.0000 ug | Freq: Once | INTRAMUSCULAR | Status: AC
  Administered 2014-07-19: 50 ug via INTRAVENOUS
  Filled 2014-07-19: qty 2

## 2014-07-19 MED ORDER — ONDANSETRON HCL 4 MG PO TABS: 4.0000 mg | ORAL_TABLET | Freq: Three times a day (TID) | ORAL | Status: DC | PRN

## 2014-07-19 MED ORDER — INSULIN GLARGINE 100 UNIT/ML ~~LOC~~ SOLN: 20.0000 [IU] | Freq: Every day | SUBCUTANEOUS | Status: DC

## 2014-07-19 MED ORDER — ACETAMINOPHEN 325 MG PO TABS: 650.0000 mg | ORAL_TABLET | ORAL | Status: DC | PRN

## 2014-07-19 MED ORDER — METFORMIN HCL 500 MG PO TABS
500.0000 mg | ORAL_TABLET | Freq: Two times a day (BID) | ORAL | Status: DC
  Administered 2014-07-19: 500 mg via ORAL
  Filled 2014-07-19 (×6): qty 1

## 2014-07-19 MED ORDER — ONDANSETRON HCL 4 MG/2ML IJ SOLN
4.0000 mg | Freq: Once | INTRAMUSCULAR | Status: AC
  Administered 2014-07-19: 4 mg via INTRAVENOUS

## 2014-07-19 MED ORDER — LORAZEPAM 1 MG PO TABS
2.0000 mg | ORAL_TABLET | Freq: Once | ORAL | Status: AC
  Administered 2014-07-19: 2 mg via ORAL
  Filled 2014-07-19: qty 2

## 2014-07-19 MED ORDER — ACETAMINOPHEN 325 MG PO TABS: 650.0000 mg | ORAL_TABLET | Freq: Four times a day (QID) | ORAL | Status: DC | PRN

## 2014-07-19 MED ORDER — TRAZODONE HCL 50 MG PO TABS
50.0000 mg | ORAL_TABLET | Freq: Every day | ORAL | Status: DC
  Filled 2014-07-19 (×2): qty 1

## 2014-07-19 MED ORDER — ONDANSETRON HCL 4 MG/2ML IJ SOLN
4.0000 mg | Freq: Once | INTRAMUSCULAR | Status: AC
  Filled 2014-07-19: qty 2

## 2014-07-19 MED ORDER — HYDROXYZINE HCL 25 MG PO TABS
25.0000 mg | ORAL_TABLET | Freq: Four times a day (QID) | ORAL | Status: DC | PRN
  Administered 2014-07-19: 25 mg via ORAL
  Filled 2014-07-19: qty 1

## 2014-07-19 MED ORDER — SODIUM CHLORIDE 0.9 % IV BOLUS (SEPSIS)
1000.0000 mL | Freq: Once | INTRAVENOUS | Status: AC
  Administered 2014-07-19: 1000 mL via INTRAVENOUS

## 2014-07-19 MED ORDER — PANTOPRAZOLE SODIUM 40 MG PO TBEC
40.0000 mg | DELAYED_RELEASE_TABLET | Freq: Every day | ORAL | Status: DC
  Filled 2014-07-19 (×2): qty 1

## 2014-07-19 MED ORDER — LINAGLIPTIN 5 MG PO TABS
5.0000 mg | ORAL_TABLET | Freq: Every day | ORAL | Status: DC
  Filled 2014-07-19 (×3): qty 1

## 2014-07-19 MED ORDER — MAGNESIUM HYDROXIDE 400 MG/5ML PO SUSP: 30.0000 mL | Freq: Every day | ORAL | Status: DC | PRN

## 2014-07-19 MED ORDER — INSULIN ASPART 100 UNIT/ML ~~LOC~~ SOLN: 15.0000 [IU] | Freq: Three times a day (TID) | SUBCUTANEOUS | Status: DC

## 2014-07-19 MED ORDER — LORAZEPAM 1 MG PO TABS
0.0000 mg | ORAL_TABLET | Freq: Four times a day (QID) | ORAL | Status: DC
  Administered 2014-07-19: 1 mg via ORAL
  Filled 2014-07-19: qty 1

## 2014-07-19 MED ORDER — ALUM & MAG HYDROXIDE-SIMETH 200-200-20 MG/5ML PO SUSP: 30.0000 mL | ORAL | Status: DC | PRN

## 2014-07-19 NOTE — BH Assessment (Signed)
Patient accepted to [facility] by Dr. Lovena Le and Waylan Boga, NP. Room assignment is 301-2. Patient to transfer to [facility] after CBG is checked and stable. Nursing repot # is [phone]. Support paperwork completed.

## 2014-07-19 NOTE — Consult Note (Signed)
  Ms Marcheta Grammes is being transferred to [facility] inpatient for treatment of her depression and alcohol dependence Bed301-2 today

## 2014-07-19 NOTE — ED Notes (Signed)
Pt high acuity due to IVC and SI/HI pta. Pt with anxiety and CIWA score of 8.

## 2014-07-19 NOTE — ED Provider Notes (Signed)
Patient care transferred from Delos Haring, PA-C, at end of shift pending [place], r/o cholecystitis. She has a history of cholelithiasis, alcohol dependence, drug abuse, hepatitis. She arrives in the ED homicidal, has been evaluated by TTS, waiting for medical clearance. Lipase 105, hyperglycemic (improving with IV insulin and fluids). Plan: if US abdomen negative, patient can be medically cleared. Admit if positive.   Re-evaluation: She is tremulous. CIWA protocol entered. [place] negative. She is awaiting psychiatric evaluation in a.m.  Charlann Lange, PA-C 07/22/14 [place], MD 07/22/14 1555

## 2014-07-19 NOTE — ED Notes (Signed)
Pt transferred here from [facility]  Where she is an inpt  Pt was sent here for c/o abdominal pain  [facility] will hold bed until the decision is made here to keep her here or send her back

## 2014-07-19 NOTE — ED Notes (Signed)
Acuity note- Patient quiet and cooperative.  NAD.  Acuity low.

## 2014-07-19 NOTE — Progress Notes (Signed)
Pt c/o nausea, vomiting, abdominal pain, abdominal distention.  On-site provider notified and pt assessed by provider.  Pt to be sent to [facility] ED for medical evaluation and complaints of nausea, vomiting and abdominal pain.  Report called to Anderson Malta, Agricultural consultant at [employer].  Pt's most recent CBG is 181 mg/dL at 2051.  Vitals: BP 127/89, P 89, T 98.8, O2 98%.  [facility] called for transport.  Pt to go to [facility] with [facility] via [facility].
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
## 2014-07-19 NOTE — Progress Notes (Signed)
Patient has been laying in bed sleeping.  Patient ate few bites of dinner.  Metformin 500 mg given to patient.  Patient refused novolog, stated she would not take insulin at home if her CBG was 108.  Discussed with NP.  Respirations even and unlabored.  Patient has been up to bathroom.  Safety maintained with 15 minute checks.  Emotional support and encouragement given patient.

## 2014-07-19 NOTE — ED Notes (Signed)
US at bedside

## 2014-07-19 NOTE — Progress Notes (Signed)
Inpatient Diabetes Program Recommendations  AACE/ADA: New Consensus Statement on Inpatient Glycemic Control (2013)  Target Ranges:  Prepandial:   less than 140 mg/dL      Peak postprandial:   less than 180 mg/dL (1-2 hours)      Critically ill patients:  140 - 180 mg/dL   Reason for Visit: Hyperglycemia  Diabetes history: DM2 Outpatient Diabetes medications: Lantus 30 units QHS, Novolog 20 units tidwc, tradjenta 5 mg QD, metformin 500 bid Current orders for Inpatient glycemic control: metformin 500 bid, tradjenta 5 mg QD, Novolog 20 units tidwc, Lantus 20 units QHS    48 y.o.female with a significant PMH of hepatitis, pancreatitis, ETOH dependency, gallstones, drug dependency, rheumatoid factor positive, hemochromatosis, pancytopenia, drug dependency, cirrhosis, diabetes, presents to the ER with complaints of homicidal ideation- she plans to kill her husband, alcohol intoxication and abdominal pain.  Results for JOSELYN, EDLING (MRN [id number]) as of 07/19/2014 09:06  Ref. Range 07/18/2014 18:36 07/18/2014 19:52 07/18/2014 21:33 07/18/2014 23:28 07/19/2014 08:20  Glucose-Capillary Latest Range: 70-99 mg/dL 446 (H) 358 (H) 371 (H) 294 (H) 255 (H)  Results for DEJAI, SCHUBACH (MRN [id number]) as of 07/19/2014 09:06  Ref. Range 07/18/2014 09:43  Hemoglobin A1C Latest Range: 4.6-6.5 % 10.0 (H)   Uncontrolled DM. PCP has referred her to endo.  Recommendations: Please add Novolog sensitive tidwc and hs.  Will follow. Thank you. Lorenda Peck, RD, LDN, CDE Inpatient Diabetes Coordinator [phone]

## 2014-07-19 NOTE — ED Notes (Signed)
Pt arrived to unit, denies SI/HI or plans to harm herself at this time. No s/s of distress noted, but pt with tremors and anxiety. Dr. Florina Ou contacted, will call unit back when available.

## 2014-07-19 NOTE — Progress Notes (Signed)
S: Notified by Nursing staff patient with c/o of LUQ abd pain. Patient rates pain a 9/10, progressive and has been ongoing x two weeks duration. Patient denies radiation of characterized dull pain, but notes the pain is exacerbated with eating. Patient is also endorsing N/V but is denying any fever, chills, rigors or change in bowel or bladder routine. The patient is requesting alcohol detox and has a hx of Hep cirrhosis and Alcoholic liver disease. The patient is denying passing of clay colored stools, tea colored urine or transient jaundice.  O: BG 181 mg/dl; T 98.8, BP 127/89, P 89 Sat 98%  Gen: A & 0 x 3 spheres,   Integument: sclera non icteric, w/d  Neuro: positive signs of asterixis  Pulm CTA   Cardio: Audible S1,S2  Abd: Distended, taught with fluid percussion wave noted, BS normoactive . Abd diffusely tender with guarding noted with palpation of LUQ, no palpable M/O  CLINICAL DATA: RIGHT upper quadrant pain, history of hepatitis-C.  EXAM: US ABDOMEN LIMITED - RIGHT UPPER QUADRANT  COMPARISON: Abdominal ultrasound February 14, 2014 and CT of the abdomen and pelvis December 24, 2013  FINDINGS: Gallbladder:  Gallbladder is mildly distended, echogenic 7 mm layering gallstone with acoustic shadowing was present on prior CT. Gallbladder wall thickening up 4.7 mm. No sonographic Murphy's sign elicited. No pericholecystic fluid.  Common bile duct:  Diameter: 4.3 mm  Liver:  Lobulated contour consistent with known cirrhosis with partially imaged ascites.  IMPRESSION: Cholelithiasis. Mild gallbladder wall thickening most consistent with portal hypertension.  Cirrhosis and ascites.   Electronically Signed  By: Elon Alas  On: 07/19/2014 02:51  Results for MYCHELLE, KENDRA (MRN [id number]) as of 07/19/2014 20:21  Ref. Range 07/18/2014 17:08 07/18/2014 18:13 07/18/2014 21:34  Sodium Latest Range: 135-145 mmol/L 139    Potassium Latest Range: 3.5-5.1  mmol/L 3.4 (L)    Chloride Latest Range: 96-112 mmol/L 95 (L)    CO2 Latest Range: 19-32 mmol/L 31    BUN Latest Range: 6-23 mg/dL 5 (L)    Creatinine Latest Range: 0.50-1.10 mg/dL 0.55    Calcium Latest Range: 8.4-10.5 mg/dL 8.9     Results for ESMIRNA, RAVAN (MRN [id number]) as of 07/19/2014 20:21  Ref. Range 07/18/2014 21:34  Lipase Latest Range: 11-59 U/L 105 (H)   Results for JAJAIRA, RUIS (MRN [id number]) as of 07/19/2014 20:21  Ref. 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 Sending patient back over for reevaluation and possible medical admission for mgmt of acute/chronic pancreatitis  * on call E[place] MD

## 2014-07-19 NOTE — Tx Team (Signed)
Initial Interdisciplinary Treatment Plan   PATIENT STRESSORS: Financial difficulties Health problems Marital or family conflict Substance abuse   PATIENT STRENGTHS: Average or above average intelligence Communication skills General fund of knowledge Motivation for treatment/growth Supportive family/friends   PROBLEM LIST: Problem List/Patient Goals Date to be addressed Date deferred Reason deferred Estimated date of resolution  "alcohol detox" 07/19/2014   D/c        "depression" 07/19/2014   D/c        "suicide" 07/19/2014   D/c                           DISCHARGE CRITERIA:  Ability to meet basic life and health needs Adequate post-discharge living arrangements Improved stabilization in mood, thinking, and/or behavior Medical problems require only outpatient monitoring Motivation to continue treatment in a less acute level of care Need for constant or close observation no longer present Reduction of life-threatening or endangering symptoms to within safe limits Safe-care adequate arrangements made Verbal commitment to aftercare and medication compliance Withdrawal symptoms are absent or subacute and managed without 24-hour nursing intervention  PRELIMINARY DISCHARGE PLAN: Attend aftercare/continuing care group Attend PHP/IOP Attend 12-step recovery group Outpatient therapy Participate in family therapy Return to previous living arrangement  PATIENT/FAMIILY INVOLVEMENT: This treatment plan has been presented to and reviewed with the patient, [name].  The patient and family have been given the opportunity to ask questions and make suggestions.  [name] 07/19/2014, 3:30 PM

## 2014-07-19 NOTE — Telephone Encounter (Signed)
Attempted to call patient back this morning on both the home number and cell number. Again, the home number did not have a VM setup and kept ringing. The cell number is still disconnected.

## 2014-07-20 ENCOUNTER — Encounter (HOSPITAL_COMMUNITY): Payer: Self-pay | Admitting: General Practice

## 2014-07-20 ENCOUNTER — Encounter (HOSPITAL_COMMUNITY): Payer: Self-pay

## 2014-07-20 ENCOUNTER — Inpatient Hospital Stay (HOSPITAL_COMMUNITY)
Admission: AD | Admit: 2014-07-20 | Discharge: 2014-07-26 | DRG: 433 | Disposition: A | Discharge status: Home / Self Care | Payer: No Typology Code available for payment source | Source: Ambulatory Visit | Attending: Internal Medicine | Admitting: Internal Medicine

## 2014-07-20 ENCOUNTER — Inpatient Hospital Stay (HOSPITAL_COMMUNITY): Payer: No Typology Code available for payment source

## 2014-07-20 DIAGNOSIS — Z833 Family history of diabetes mellitus: Secondary | ICD-10-CM | POA: Diagnosis not present

## 2014-07-20 DIAGNOSIS — E119 Type 2 diabetes mellitus without complications: Secondary | ICD-10-CM | POA: Diagnosis not present

## 2014-07-20 DIAGNOSIS — Z791 Long term (current) use of non-steroidal anti-inflammatories (NSAID): Secondary | ICD-10-CM | POA: Diagnosis not present

## 2014-07-20 DIAGNOSIS — K219 Gastro-esophageal reflux disease without esophagitis: Secondary | ICD-10-CM | POA: Diagnosis present

## 2014-07-20 DIAGNOSIS — R7989 Other specified abnormal findings of blood chemistry: Secondary | ICD-10-CM | POA: Diagnosis present

## 2014-07-20 DIAGNOSIS — R109 Unspecified abdominal pain: Secondary | ICD-10-CM | POA: Diagnosis present

## 2014-07-20 DIAGNOSIS — K7011 Alcoholic hepatitis with ascites: Principal | ICD-10-CM | POA: Diagnosis present

## 2014-07-20 DIAGNOSIS — F1029 Alcohol dependence with unspecified alcohol-induced disorder: Secondary | ICD-10-CM

## 2014-07-20 DIAGNOSIS — Z8041 Family history of malignant neoplasm of ovary: Secondary | ICD-10-CM

## 2014-07-20 DIAGNOSIS — K7031 Alcoholic cirrhosis of liver with ascites: Secondary | ICD-10-CM | POA: Diagnosis present

## 2014-07-20 DIAGNOSIS — F329 Major depressive disorder, single episode, unspecified: Secondary | ICD-10-CM | POA: Diagnosis present

## 2014-07-20 DIAGNOSIS — Z79899 Other long term (current) drug therapy: Secondary | ICD-10-CM | POA: Diagnosis not present

## 2014-07-20 DIAGNOSIS — Z90710 Acquired absence of both cervix and uterus: Secondary | ICD-10-CM

## 2014-07-20 DIAGNOSIS — F10239 Alcohol dependence with withdrawal, unspecified: Secondary | ICD-10-CM | POA: Diagnosis present

## 2014-07-20 DIAGNOSIS — R161 Splenomegaly, not elsewhere classified: Secondary | ICD-10-CM | POA: Diagnosis present

## 2014-07-20 DIAGNOSIS — G8929 Other chronic pain: Secondary | ICD-10-CM | POA: Diagnosis present

## 2014-07-20 DIAGNOSIS — D696 Thrombocytopenia, unspecified: Secondary | ICD-10-CM | POA: Diagnosis present

## 2014-07-20 DIAGNOSIS — R1110 Vomiting, unspecified: Secondary | ICD-10-CM | POA: Diagnosis not present

## 2014-07-20 DIAGNOSIS — K2920 Alcoholic gastritis without bleeding: Secondary | ICD-10-CM | POA: Diagnosis present

## 2014-07-20 DIAGNOSIS — F332 Major depressive disorder, recurrent severe without psychotic features: Secondary | ICD-10-CM | POA: Diagnosis not present

## 2014-07-20 DIAGNOSIS — Z794 Long term (current) use of insulin: Secondary | ICD-10-CM

## 2014-07-20 DIAGNOSIS — E1165 Type 2 diabetes mellitus with hyperglycemia: Secondary | ICD-10-CM | POA: Diagnosis present

## 2014-07-20 DIAGNOSIS — F419 Anxiety disorder, unspecified: Secondary | ICD-10-CM | POA: Diagnosis present

## 2014-07-20 DIAGNOSIS — B1920 Unspecified viral hepatitis C without hepatic coma: Secondary | ICD-10-CM | POA: Diagnosis present

## 2014-07-20 DIAGNOSIS — K5900 Constipation, unspecified: Secondary | ICD-10-CM | POA: Diagnosis present

## 2014-07-20 DIAGNOSIS — R945 Abnormal results of liver function studies: Secondary | ICD-10-CM

## 2014-07-20 DIAGNOSIS — K7030 Alcoholic cirrhosis of liver without ascites: Secondary | ICD-10-CM | POA: Diagnosis not present

## 2014-07-20 DIAGNOSIS — K766 Portal hypertension: Secondary | ICD-10-CM | POA: Diagnosis present

## 2014-07-20 DIAGNOSIS — Z888 Allergy status to other drugs, medicaments and biological substances status: Secondary | ICD-10-CM | POA: Diagnosis not present

## 2014-07-20 DIAGNOSIS — R1010 Upper abdominal pain, unspecified: Secondary | ICD-10-CM | POA: Diagnosis not present

## 2014-07-20 DIAGNOSIS — K8020 Calculus of gallbladder without cholecystitis without obstruction: Secondary | ICD-10-CM | POA: Diagnosis present

## 2014-07-20 DIAGNOSIS — F1020 Alcohol dependence, uncomplicated: Secondary | ICD-10-CM | POA: Diagnosis not present

## 2014-07-20 DIAGNOSIS — R1084 Generalized abdominal pain: Secondary | ICD-10-CM | POA: Diagnosis not present

## 2014-07-20 DIAGNOSIS — B182 Chronic viral hepatitis C: Secondary | ICD-10-CM | POA: Diagnosis not present

## 2014-07-20 LAB — BODY FLUID CELL COUNT WITH DIFFERENTIAL
Lymphs, Fluid: 58 %
MONOCYTE-MACROPHAGE-SEROUS FLUID: 42 % — AB (ref 50–90)
Total Nucleated Cell Count, Fluid: 152 cu mm (ref 0–1000)

## 2014-07-20 LAB — URINALYSIS, ROUTINE W REFLEX MICROSCOPIC
Bilirubin Urine: NEGATIVE
Glucose, UA: NEGATIVE mg/dL
Hgb urine dipstick: NEGATIVE
Ketones, ur: NEGATIVE mg/dL
NITRITE: NEGATIVE
PH: 7.5 (ref 5.0–8.0)
Protein, ur: NEGATIVE mg/dL
Specific Gravity, Urine: 1.023 (ref 1.005–1.030)
UROBILINOGEN UA: 1 mg/dL (ref 0.0–1.0)

## 2014-07-20 LAB — ACETAMINOPHEN LEVEL: Acetaminophen (Tylenol), Serum: <10 ug/mL — ABNORMAL LOW (ref 10–30)

## 2014-07-20 LAB — URINE MICROSCOPIC-ADD ON

## 2014-07-20 LAB — HEPATIC FUNCTION PANEL
ALBUMIN: 2.7 g/dL — AB (ref 3.5–5.2)
ALK PHOS: 125 U/L — AB (ref 39–117)
ALT: 85 U/L — AB (ref 0–35)
AST: 147 U/L — AB (ref 0–37)
BILIRUBIN INDIRECT: 1.1 mg/dL — AB (ref 0.3–0.9)
BILIRUBIN TOTAL: 2.4 mg/dL — AB (ref 0.3–1.2)
Bilirubin, Direct: 1.3 mg/dL — ABNORMAL HIGH (ref 0.0–0.5)
TOTAL PROTEIN: 6.7 g/dL (ref 6.0–8.3)

## 2014-07-20 LAB — BASIC METABOLIC PANEL
Anion gap: 7 (ref 5–15)
BUN: 7 mg/dL (ref 6–23)
CALCIUM: 8.2 mg/dL — AB (ref 8.4–10.5)
CO2: 30 mmol/L (ref 19–32)
Chloride: 99 mmol/L (ref 96–112)
Creatinine, Ser: 0.36 mg/dL — ABNORMAL LOW (ref 0.50–1.10)
GFR calc Af Amer: >90 mL/min (ref >90)
GFR calc non Af Amer: >90 mL/min (ref >90)
GLUCOSE: 133 mg/dL — AB (ref 70–99)
Potassium: 3.5 mmol/L (ref 3.5–5.1)
Sodium: 136 mmol/L (ref 135–145)

## 2014-07-20 LAB — GLUCOSE, CAPILLARY
GLUCOSE-CAPILLARY: 146 mg/dL — AB (ref 70–99)
GLUCOSE-CAPILLARY: 286 mg/dL — AB (ref 70–99)
GLUCOSE-CAPILLARY: 141 mg/dL — AB (ref 70–99)
Glucose-Capillary: 178 mg/dL — ABNORMAL HIGH (ref 70–99)

## 2014-07-20 LAB — CBC WITH DIFFERENTIAL/PLATELET
BASOS PCT: 1 % (ref 0–1)
Basophils Absolute: 0 10*3/uL (ref 0.0–0.1)
EOS ABS: 0.1 10*3/uL (ref 0.0–0.7)
EOS PCT: 3 % (ref 0–5)
HCT: 40.2 % (ref 36.0–46.0)
Hemoglobin: 13.5 g/dL (ref 12.0–15.0)
Lymphocytes Relative: 35 % (ref 12–46)
Lymphs Abs: 0.6 10*3/uL — ABNORMAL LOW (ref 0.7–4.0)
MCH: 36 pg — ABNORMAL HIGH (ref 26.0–34.0)
MCHC: 33.6 g/dL (ref 30.0–36.0)
MCV: 107.2 fL — ABNORMAL HIGH (ref 78.0–100.0)
MONOS PCT: 17 % — AB (ref 3–12)
Monocytes Absolute: 0.3 10*3/uL (ref 0.1–1.0)
NEUTROS PCT: 44 % (ref 43–77)
Neutro Abs: 0.8 10*3/uL — ABNORMAL LOW (ref 1.7–7.7)
Platelets: 36 10*3/uL — ABNORMAL LOW (ref 150–400)
RBC: 3.75 MIL/uL — AB (ref 3.87–5.11)
RDW: 14.1 % (ref 11.5–15.5)
WBC: 1.8 10*3/uL — ABNORMAL LOW (ref 4.0–10.5)

## 2014-07-20 LAB — PATHOLOGIST SMEAR REVIEW

## 2014-07-20 LAB — PROTIME-INR
INR: 1.25 (ref 0.00–1.49)
PROTHROMBIN TIME: 15.8 s — AB (ref 11.6–15.2)

## 2014-07-20 LAB — GLUCOSE, SEROUS FLUID: Glucose, Fluid: 256 mg/dL

## 2014-07-20 MED ORDER — LINAGLIPTIN 5 MG PO TABS
5.0000 mg | ORAL_TABLET | Freq: Every day | ORAL | Status: DC
  Filled 2014-07-20: qty 1

## 2014-07-20 MED ORDER — INSULIN GLARGINE 100 UNIT/ML ~~LOC~~ SOLN
10.0000 [IU] | Freq: Every day | SUBCUTANEOUS | Status: DC
  Administered 2014-07-20 – 2014-07-25 (×5): 10 [IU] via SUBCUTANEOUS
  Filled 2014-07-20 (×6): qty 0.1

## 2014-07-20 MED ORDER — LORAZEPAM 1 MG PO TABS: 1.0000 mg | ORAL_TABLET | Freq: Four times a day (QID) | ORAL | Status: DC | PRN

## 2014-07-20 MED ORDER — INSULIN ASPART 100 UNIT/ML ~~LOC~~ SOLN
0.0000 [IU] | Freq: Three times a day (TID) | SUBCUTANEOUS | Status: DC
  Administered 2014-07-20: 1 [IU] via SUBCUTANEOUS
  Administered 2014-07-20: 2 [IU] via SUBCUTANEOUS
  Administered 2014-07-20: 5 [IU] via SUBCUTANEOUS
  Administered 2014-07-21 – 2014-07-22 (×2): 1 [IU] via SUBCUTANEOUS
  Administered 2014-07-22: 2 [IU] via SUBCUTANEOUS
  Administered 2014-07-22: 1 [IU] via SUBCUTANEOUS
  Administered 2014-07-23: 7 [IU] via SUBCUTANEOUS
  Administered 2014-07-23: 2 [IU] via SUBCUTANEOUS
  Administered 2014-07-23: 1 [IU] via SUBCUTANEOUS
  Administered 2014-07-24 (×3): 2 [IU] via SUBCUTANEOUS
  Administered 2014-07-25: 3 [IU] via SUBCUTANEOUS
  Administered 2014-07-25: 2 [IU] via SUBCUTANEOUS
  Administered 2014-07-25: 3 [IU] via SUBCUTANEOUS
  Administered 2014-07-26: 2 [IU] via SUBCUTANEOUS
  Administered 2014-07-26: 5 [IU] via SUBCUTANEOUS

## 2014-07-20 MED ORDER — IOHEXOL 300 MG/ML  SOLN
100.0000 mL | Freq: Once | INTRAMUSCULAR | Status: AC | PRN
  Administered 2014-07-20: 100 mL via INTRAVENOUS

## 2014-07-20 MED ORDER — DIPHENHYDRAMINE HCL 25 MG PO CAPS
50.0000 mg | ORAL_CAPSULE | Freq: Four times a day (QID) | ORAL | Status: DC | PRN
  Administered 2014-07-24: 50 mg via ORAL
  Filled 2014-07-20 (×2): qty 2

## 2014-07-20 MED ORDER — SERTRALINE HCL 50 MG PO TABS
150.0000 mg | ORAL_TABLET | Freq: Every day | ORAL | Status: DC
  Administered 2014-07-20 – 2014-07-26 (×7): 150 mg via ORAL
  Filled 2014-07-20 (×7): qty 1

## 2014-07-20 MED ORDER — LORAZEPAM 2 MG/ML IJ SOLN: 1.0000 mg | Freq: Four times a day (QID) | INTRAMUSCULAR | Status: DC | PRN

## 2014-07-20 MED ORDER — LINAGLIPTIN 5 MG PO TABS: 5.0000 mg | ORAL_TABLET | Freq: Every day | ORAL | Status: DC

## 2014-07-20 MED ORDER — LORAZEPAM 1 MG PO TABS
1.0000 mg | ORAL_TABLET | Freq: Four times a day (QID) | ORAL | Status: AC | PRN
  Administered 2014-07-21 – 2014-07-22 (×3): 1 mg via ORAL
  Filled 2014-07-20 (×4): qty 1

## 2014-07-20 MED ORDER — SPIRONOLACTONE 12.5 MG HALF TABLET
12.5000 mg | ORAL_TABLET | Freq: Every day | ORAL | Status: DC
  Administered 2014-07-20 – 2014-07-26 (×7): 12.5 mg via ORAL
  Filled 2014-07-20 (×7): qty 1

## 2014-07-20 MED ORDER — SERTRALINE HCL 50 MG PO TABS: 150.0000 mg | ORAL_TABLET | Freq: Every day | ORAL | Status: DC

## 2014-07-20 MED ORDER — ZOLPIDEM TARTRATE 10 MG PO TABS
10.0000 mg | ORAL_TABLET | Freq: Every evening | ORAL | Status: DC | PRN
Start: 2014-07-20 — End: 2014-07-20

## 2014-07-20 MED ORDER — ACETAMINOPHEN 650 MG RE SUPP: 650.0000 mg | Freq: Four times a day (QID) | RECTAL | Status: DC | PRN

## 2014-07-20 MED ORDER — FOLIC ACID 1 MG PO TABS: 1.0000 mg | ORAL_TABLET | Freq: Every day | ORAL | Status: DC

## 2014-07-20 MED ORDER — LORAZEPAM 1 MG PO TABS: 0.0000 mg | ORAL_TABLET | Freq: Two times a day (BID) | ORAL | Status: DC

## 2014-07-20 MED ORDER — MORPHINE SULFATE 2 MG/ML IJ SOLN
1.0000 mg | INTRAMUSCULAR | Status: DC | PRN
  Administered 2014-07-20: 1 mg via INTRAVENOUS
  Filled 2014-07-20: qty 1

## 2014-07-20 MED ORDER — NAPHAZOLINE HCL 0.1 % OP SOLN: 1.0000 [drp] | Freq: Four times a day (QID) | OPHTHALMIC | Status: DC | PRN

## 2014-07-20 MED ORDER — IOHEXOL 300 MG/ML  SOLN
50.0000 mL | Freq: Once | INTRAMUSCULAR | Status: AC | PRN
  Administered 2014-07-20: 50 mL via ORAL

## 2014-07-20 MED ORDER — VITAMIN B-1 100 MG PO TABS: 100.0000 mg | ORAL_TABLET | Freq: Every day | ORAL | Status: DC

## 2014-07-20 MED ORDER — ONDANSETRON HCL 4 MG/2ML IJ SOLN: 4.0000 mg | Freq: Four times a day (QID) | INTRAMUSCULAR | Status: DC | PRN

## 2014-07-20 MED ORDER — PANTOPRAZOLE SODIUM 40 MG IV SOLR: 40.0000 mg | Freq: Two times a day (BID) | INTRAVENOUS | Status: DC

## 2014-07-20 MED ORDER — CEFTRIAXONE SODIUM IN DEXTROSE 40 MG/ML IV SOLN
2.0000 g | INTRAVENOUS | Status: DC
  Administered 2014-07-20 – 2014-07-25 (×6): 2 g via INTRAVENOUS
  Filled 2014-07-20 (×6): qty 50

## 2014-07-20 MED ORDER — LORAZEPAM 1 MG PO TABS: 0.0000 mg | ORAL_TABLET | Freq: Four times a day (QID) | ORAL | Status: DC

## 2014-07-20 MED ORDER — SODIUM CHLORIDE 0.9 % IV SOLN: INTRAVENOUS | Status: DC

## 2014-07-20 MED ORDER — DIPHENHYDRAMINE HCL 25 MG PO CAPS: 50.0000 mg | ORAL_CAPSULE | Freq: Four times a day (QID) | ORAL | Status: DC | PRN

## 2014-07-20 MED ORDER — VITAMIN B-1 100 MG PO TABS
100.0000 mg | ORAL_TABLET | Freq: Every day | ORAL | Status: DC
  Administered 2014-07-21 – 2014-07-26 (×6): 100 mg via ORAL
  Filled 2014-07-20 (×7): qty 1

## 2014-07-20 MED ORDER — INSULIN GLARGINE 100 UNIT/ML ~~LOC~~ SOLN: 30.0000 [IU] | Freq: Every day | SUBCUTANEOUS | Status: DC

## 2014-07-20 MED ORDER — INSULIN ASPART 100 UNIT/ML ~~LOC~~ SOLN: 0.0000 [IU] | Freq: Three times a day (TID) | SUBCUTANEOUS | Status: DC

## 2014-07-20 MED ORDER — THIAMINE HCL 100 MG/ML IJ SOLN
100.0000 mg | Freq: Every day | INTRAMUSCULAR | Status: DC
  Administered 2014-07-20: 100 mg via INTRAVENOUS
  Filled 2014-07-20 (×3): qty 1

## 2014-07-20 MED ORDER — LORAZEPAM 2 MG/ML IJ SOLN
1.0000 mg | Freq: Four times a day (QID) | INTRAMUSCULAR | Status: AC | PRN
  Administered 2014-07-21 – 2014-07-22 (×2): 1 mg via INTRAVENOUS
  Filled 2014-07-20 (×2): qty 1

## 2014-07-20 MED ORDER — MORPHINE SULFATE 2 MG/ML IJ SOLN
1.0000 mg | INTRAMUSCULAR | Status: DC | PRN
  Administered 2014-07-20: 2 mg via INTRAVENOUS
  Administered 2014-07-20: 1 mg via INTRAVENOUS
  Administered 2014-07-20 – 2014-07-22 (×7): 2 mg via INTRAVENOUS
  Filled 2014-07-20 (×9): qty 1

## 2014-07-20 MED ORDER — FOLIC ACID 1 MG PO TABS
1.0000 mg | ORAL_TABLET | Freq: Every day | ORAL | Status: DC
  Administered 2014-07-20 – 2014-07-26 (×7): 1 mg via ORAL
  Filled 2014-07-20 (×7): qty 1

## 2014-07-20 MED ORDER — LORAZEPAM 1 MG PO TABS
0.0000 mg | ORAL_TABLET | Freq: Two times a day (BID) | ORAL | Status: AC
Start: 2014-07-22 — End: 2014-07-24
  Administered 2014-07-22 – 2014-07-23 (×2): 1 mg via ORAL
  Filled 2014-07-20: qty 1

## 2014-07-20 MED ORDER — ONDANSETRON HCL 4 MG/2ML IJ SOLN: 4.0000 mg | Freq: Three times a day (TID) | INTRAMUSCULAR | Status: DC | PRN

## 2014-07-20 MED ORDER — PANTOPRAZOLE SODIUM 40 MG IV SOLR
40.0000 mg | Freq: Two times a day (BID) | INTRAVENOUS | Status: DC
  Administered 2014-07-20 – 2014-07-21 (×4): 40 mg via INTRAVENOUS
  Filled 2014-07-20 (×6): qty 40

## 2014-07-20 MED ORDER — CEFTRIAXONE SODIUM 2 G IJ SOLR
2.0000 g | INTRAMUSCULAR | Status: DC
  Filled 2014-07-20: qty 2

## 2014-07-20 MED ORDER — ONDANSETRON HCL 4 MG PO TABS: 4.0000 mg | ORAL_TABLET | Freq: Four times a day (QID) | ORAL | Status: DC | PRN

## 2014-07-20 MED ORDER — ZOLPIDEM TARTRATE 5 MG PO TABS
5.0000 mg | ORAL_TABLET | Freq: Every evening | ORAL | Status: DC | PRN
  Administered 2014-07-25: 5 mg via ORAL
  Filled 2014-07-20 (×2): qty 1

## 2014-07-20 MED ORDER — THIAMINE HCL 100 MG/ML IJ SOLN: 100.0000 mg | Freq: Every day | INTRAMUSCULAR | Status: DC

## 2014-07-20 MED ORDER — ACETAMINOPHEN 325 MG PO TABS
650.0000 mg | ORAL_TABLET | Freq: Four times a day (QID) | ORAL | Status: DC | PRN
Start: 2014-07-20 — End: 2014-07-20

## 2014-07-20 MED ORDER — LORAZEPAM 1 MG PO TABS
0.0000 mg | ORAL_TABLET | Freq: Four times a day (QID) | ORAL | Status: AC
  Administered 2014-07-20: 1 mg via ORAL
  Administered 2014-07-20: 2 mg via ORAL
  Administered 2014-07-21: 1 mg via ORAL
  Filled 2014-07-20 (×2): qty 1
  Filled 2014-07-20: qty 2

## 2014-07-20 MED ORDER — MORPHINE SULFATE 2 MG/ML IJ SOLN: 1.0000 mg | INTRAMUSCULAR | Status: DC | PRN

## 2014-07-20 NOTE — Procedures (Signed)
Small amount of ascites in right pelvis Successful US guided paracentesis from RLQ.  Yielded 144mL of amber colored fluid.  No immediate complications.  Pt tolerated well.   Specimen was sent for labs.  Ascencion Dike PA-C 07/20/2014 11:09 AM

## 2014-07-20 NOTE — Progress Notes (Signed)
[facility] PROGRESS NOTE  [name] ZOX:[id number] DOB: [birth year] DOA: 07/20/2014 PCP: Cathlean Cower, MD  Assessment/Plan: 1-Abdominal pain;  Covering for SBP. Continue with ceftriaxone.  S/P paracentesis: 3-30 yielding 180 cc. Follow culture.  Abdominal US; Cholelithiasis. Cirrhosis, ascites.  CT abdomen: 1. Hepatic cirrhosis with portal hypertension and splenomegaly. Splenomegaly is mildly progressed from prior exam.  Development of small to moderate volume of intra-abdominal ascites. Cholelithiasis. Gallbladder wall thickening is a common finding in chronic liver disease. Mild colonic wall thickening involving the cecum and ascending colon, may reflect portal enteropathy. Mildly elevated lipases.   History of depression - presently no suicidal or suicidal ideation. Continue home medications. Psych consulted. Patient was transfer from [facility] 3-29.   Elevated LFTs most likely secondary to alcoholic hepatitis - follow LFTs closely. Trending down, suspect secondary to alcohol intake.   Diabetes mellitus type 2 decrease : uncontrolled.  lantus due to NPO after midnight.   Chronic leukopenia and thrombocytopenia - probably secondary to cirrhosis and alcoholism. Follow CBC closely.   Code Status: Full code.  Family Communication: care discussed with patient Disposition Plan: remain inpatient.    Consultants:  none  Procedures:  Paracentesis.   HIDA  Antibiotics:  Ceftriaxone 3-30  HPI/Subjective: Still complaining of abdominal pain, wants more pain medications. No nausea or vomiting. She was drinking prior coming  Objective: Filed Vitals:   07/20/14 1105  BP: 118/93  Pulse:   Temp:   Resp:     Intake/Output Summary (Last 24 hours) at 07/20/14 1405 Last data filed at 07/20/14 0900  Gross per 24 hour  Intake    120 ml  Output    350 ml  Net   -230 ml   Filed Weights   07/20/14 0230  Weight: 55.3 kg (121 lb 14.6 oz)    Exam:   General:  Alert in no  distress.   Cardiovascular: S 1, S 2 RRR  Respiratory: CTA  Abdomen: BS present, distended, tenderness.   Musculoskeletal: no edema.   Data Reviewed: Basic Metabolic Panel:  Recent Labs Lab 07/18/14 0943 07/18/14 1708 07/19/14 2222 07/20/14 0720  NA 130* 139 133* 136  K 3.8 3.4* 3.6 3.5  CL 93* 95* 96 99  CO2 26 31 29 30   GLUCOSE 648 Repeated and verified X2.* 420* 168* 133*  BUN 6 5* 7 7  CREATININE 0.63 0.55 0.36* 0.36*  CALCIUM 8.9 8.9 8.7 8.2*   Liver Function Tests:  Recent Labs Lab 07/18/14 0943 07/18/14 1708 07/19/14 2222 07/20/14 0720  AST 146* 168* 172* 147*  ALT 104* 115* 93* 85*  ALKPHOS 157* 168* 143* 125*  BILITOT 1.8* 1.8* 2.6* 2.4*  PROT 7.9 8.4* 7.4 6.7  ALBUMIN 3.3* 3.7 2.9* 2.7*    Recent Labs Lab 07/18/14 2134 07/19/14 2222  LIPASE 105* 53   No results for input(s): AMMONIA in the last 168 hours. CBC:  Recent Labs Lab 07/18/14 0943 07/18/14 1708 07/19/14 2222 07/20/14 0720  WBC 2.5* 3.6* 2.4* 1.8*  NEUTROABS 1.4  --  1.4* 0.8*  HGB 13.8 14.3 13.8 13.5  HCT 40.0 42.5 40.1 40.2  MCV 104.8* 106.8* 105.8* 107.2*  PLT 60.0* 69* 39* 36*   Cardiac Enzymes: No results for input(s): CKTOTAL, CKMB, CKMBINDEX, TROPONINI in the last 168 hours. BNP (last 3 results) No results for input(s): BNP in the last 8760 hours.  ProBNP (last 3 results) No results for input(s): PROBNP in the last 8760 hours.  CBG:  Recent Labs Lab 07/19/14 1145 07/19/14  1702 07/19/14 2051 07/20/14 0739 07/20/14 1150  GLUCAP 126* 108* 181* 146* 178*    No results found for this or any previous visit (from the past 240 hour(s)).   Studies: Ct Abdomen Pelvis W Contrast  07/20/2014   CLINICAL DATA:  Severe abdominal pain for days, increasing nausea and vomiting. History of hysterectomy and appendectomy.  EXAM: CT ABDOMEN AND PELVIS WITH CONTRAST  TECHNIQUE: Multidetector CT imaging of the abdomen and pelvis was performed using the standard protocol  following bolus administration of intravenous contrast.  CONTRAST:  137mL OMNIPAQUE IOHEXOL 300 MG/ML SOLN, 77mL OMNIPAQUE IOHEXOL 300 MG/ML SOLN  COMPARISON:  Ultrasound 12 hours prior.  CT 12/24/2013  FINDINGS: Minimal atelectasis in the right and left lower lobe. Minimal left pleural thickening without effusion.  Nodular hepatic contour consistent with cirrhosis. No focal enhancing hepatic lesion. Small to moderate volume of perihepatic ascites. Portal vein is patent. Splenomegaly, the spleen measures 15.3 x 13.3 x 5.7 cm for a volume of 703 mL. Mild perisplenic varices.  The gallbladder is contracted with gallstones and gallbladder wall thickening. There is no biliary dilatation.  No peripancreatic inflammatory change or ductal dilatation. The adrenal glands are normal. Kidneys demonstrate symmetric enhancement and excretion. Sub centimeter cyst in the upper left kidney.  Stomach is physiologically distended. Small hiatal hernia. There are no dilated or thickened bowel loops. Moderate stool throughout the colon. Minimal colonic wall thickening in the cecum and ascending colon, may reflect portal enteropathy. The appendix is not visualized.  Small to moderate ascites in the right pericolic gutter and tracking into the pelvis. Abdominal aorta is normal in caliber . No retroperitoneal adenopathy. Mild whole body wall edema suggesting third-spacing.  Within the pelvis the bladder is physiologically distended. The uterus is surgically absent. No adnexal mass. There is no free air.  There are no acute or suspicious osseous abnormalities.  IMPRESSION: 1. Hepatic cirrhosis with portal hypertension and splenomegaly. No focal enhancing hepatic lesion. Splenomegaly is mildly progressed from prior exam. 2. Development of small to moderate volume of intra-abdominal ascites. 3. Cholelithiasis. Gallbladder wall thickening is a common finding in chronic liver disease. 4. Mild colonic wall thickening involving the cecum and  ascending colon, may reflect portal enteropathy.   Electronically Signed   By: Jeb Levering M.D.   On: 07/20/2014 02:29   US Abdomen Limited  07/19/2014   CLINICAL DATA:  RIGHT upper quadrant pain, history of hepatitis-C.  EXAM: US ABDOMEN LIMITED - RIGHT UPPER QUADRANT  COMPARISON:  Abdominal ultrasound February 14, 2014 and CT of the abdomen and pelvis December 24, 2013  FINDINGS: Gallbladder:  Gallbladder is mildly distended, echogenic 7 mm layering gallstone with acoustic shadowing was present on prior CT. Gallbladder wall thickening up 4.7 mm. No sonographic Murphy's sign elicited. No pericholecystic fluid.  Common bile duct:  Diameter: 4.3 mm  Liver:  Lobulated contour consistent with known cirrhosis with partially imaged ascites.  IMPRESSION: Cholelithiasis. Mild gallbladder wall thickening most consistent with portal hypertension.  Cirrhosis and ascites.   Electronically Signed   By: Elon Alas   On: 07/19/2014 02:51   US Paracentesis  07/20/2014   CLINICAL DATA:  Abdominal pain. Ascites. Request paracentesis for possible spontaneous bacterial peritonitis.  EXAM: ULTRASOUND GUIDED PARACENTESIS  COMPARISON:  None.  PROCEDURE: An ultrasound guided paracentesis was thoroughly discussed with the patient and questions answered. The benefits, risks, alternatives and complications were also discussed. The patient understands and wishes to proceed with the procedure. Written consent was obtained.  Ultrasound was performed to localize and mark an adequate pocket of fluid in the right lower quadrant of the abdomen. The area was then prepped and draped in the normal sterile fashion. 1% Lidocaine was used for local anesthesia. Under ultrasound guidance a 19 gauge Yueh catheter was introduced. Paracentesis was performed. The catheter was removed and a dressing applied.  COMPLICATIONS: None immediate  FINDINGS: A total of approximately 180 mL of clear, amber colored fluid was removed. A fluid sample was  sent for laboratory analysis.  IMPRESSION: Successful ultrasound guided paracentesis yielding 180 mL of ascites.  Read by: Ascencion Dike PA-C   Electronically Signed   By: Jerilynn Mages.  Shick M.D.   On: 07/20/2014 11:35    Scheduled Meds: . cefTRIAXone (ROCEPHIN)  IV  2 g Intravenous Q24H  . folic acid  1 mg Oral Daily  . insulin aspart  0-9 Units Subcutaneous TID WC  . insulin glargine  10 Units Subcutaneous QHS  . LORazepam  0-4 mg Oral Q6H   Followed by  . [START ON 07/22/2014] LORazepam  0-4 mg Oral Q12H  . pantoprazole (PROTONIX) IV  40 mg Intravenous Q12H  . sertraline  150 mg Oral Daily  . thiamine  100 mg Oral Daily   Or  . thiamine  100 mg Intravenous Daily   Continuous Infusions:   Principal Problem:   Abdominal pain Active Problems:   Thrombocytopenia   Diabetes mellitus type 2, controlled   Cirrhosis of liver   Elevated LFTs   Alcoholism    Time spent: 35 minutes.     Niel Hummer A  [facility] Pager [phone]. If 7PM-7AM, please contact night-coverage at [email or web address], password [facility] 07/20/2014, 2:05 PM  LOS: 0 days

## 2014-07-20 NOTE — Progress Notes (Signed)
ANTIBIOTIC CONSULT NOTE - INITIAL  Pharmacy Consult for Rocephin Indication: SBP  Allergies  Allergen Reactions  . Betadine [Povidone Iodine] Hives    Patient Measurements: Height: 5\' 2"  (157.5 cm) Weight: 121 lb 14.6 oz (55.3 kg) IBW/kg (Calculated) : 50.1   Vital Signs: Temp: 98.8 F (37.1 C) (03/30 0230) Temp Source: Oral (03/30 0230) BP: 135/80 mmHg (03/30 0230) Pulse Rate: 87 (03/30 0230) Intake/Output from previous day:   Intake/Output from this shift:    Labs:  Recent Labs  07/18/14 0943 07/18/14 1708 07/19/14 2222  WBC 2.5* 3.6* 2.4*  HGB 13.8 14.3 13.8  PLT 60.0* 69* 39*  CREATININE 0.63 0.55 0.36*   Estimated Creatinine Clearance: 68 mL/min (by C-G formula based on Cr of 0.36). No results for input(s): VANCOTROUGH, VANCOPEAK, VANCORANDOM, GENTTROUGH, GENTPEAK, GENTRANDOM, TOBRATROUGH, TOBRAPEAK, TOBRARND, AMIKACINPEAK, AMIKACINTROU, AMIKACIN in the last 72 hours.   Microbiology: No results found for this or any previous visit (from the past 720 hour(s)).  Medical History: Past Medical History  Diagnosis Date  . Impaired glucose tolerance 11/07/2010  . Hepatitis C approx dx 2005    no tx to date  . Pancreatitis     HX of  . Alcohol dependency     at least [facility] admissions 2007- 09/2013 for detox.   Marland Kitchen GERD (gastroesophageal reflux disease)   . Chronic constipation   . Gallstones   . Drug dependency     Hx of cocaine use  . Anxiety and depression 08/2011  . Rheumatoid factor positive   . Hemochromatosis     last phlebotomy tx ~ 2012. liver bx 2006  . Pancytopenia   . Abnormal vaginal bleeding     uterine fibroid  . Gallstone 2006  . Chronic abdominal pain 2011  . Cirrhosis 11/09/2010  . Type II or unspecified type diabetes mellitus without mention of complication, uncontrolled 09/17/2011  . Major depression 11/2012, 09/2013    [facility] admissions for this, suicide attempt by OD Ambien (2014), Prozac (2015),ETOHism   . Anxiety     Medications:   Prescriptions prior to admission  Medication Sig Dispense Refill Last Dose  . diphenhydrAMINE (BENADRYL) 25 MG tablet Take 50 mg by mouth every 6 (six) hours as needed for sleep.   Past Week at Unknown time  . esomeprazole (NEXIUM) 40 MG capsule Take 40 mg by mouth daily as needed (heartburn).   07/17/2014  . ibuprofen (ADVIL,MOTRIN) 200 MG tablet Take 400 mg by mouth every 6 (six) hours as needed for moderate pain.   Taking  . insulin aspart (NOVOLOG) 100 UNIT/ML injection Inject 20 Units into the skin 3 (three) times daily before meals.   07/17/2014  . insulin glargine (LANTUS) 100 UNIT/ML injection Inject 0.2 mLs (20 Units total) into the skin at bedtime. (Patient taking differently: Inject 30 Units into the skin at bedtime. ) 10 mL 11 07/17/2014  . linagliptin (TRADJENTA) 5 MG TABS tablet Take 1 tablet (5 mg total) by mouth daily. For diabetes 30 tablet  07/18/2014 at Unknown time  . metFORMIN (GLUCOPHAGE) 500 MG tablet TAKE 1 TABLET (500 MG TOTAL) BY MOUTH 2 (TWO) TIMES DAILY WITH A MEAL. 60 tablet 11 07/18/2014  . Multiple Vitamin (MULTIVITAMIN WITH MINERALS) TABS tablet Take 1 tablet by mouth daily.    07/18/2014 at Unknown time  . sertraline (ZOLOFT) 100 MG tablet Take 150 mg by mouth daily.   07/18/2014 at Unknown time  . tetrahydrozoline (VISINE) 0.05 % ophthalmic solution Place 1 drop into both eyes  every morning.   07/18/2014 at Unknown time  . zolpidem (AMBIEN) 10 MG tablet Take 1 tablet (10 mg total) by mouth at bedtime as needed for sleep. 30 tablet 5    Scheduled:  . cefTRIAXone (ROCEPHIN)  IV  2 g Intravenous Q24H  . folic acid  1 mg Oral Daily  . insulin aspart  0-9 Units Subcutaneous TID WC  . insulin glargine  30 Units Subcutaneous QHS  . linagliptin  5 mg Oral Daily  . LORazepam  0-4 mg Oral Q6H   Followed by  . [START ON 07/22/2014] LORazepam  0-4 mg Oral Q12H  . pantoprazole (PROTONIX) IV  40 mg Intravenous Q12H  . sertraline  150 mg Oral Daily  . thiamine  100 mg Oral Daily    Or  . thiamine  100 mg Intravenous Daily   Infusions:   Assessment: 26 yoF [facility] c/o [facility].  Rocephin per Rx for SBP.   Goal of Therapy:  Treat infection  Plan:   Rocephin 2gm IV q24h  Rx will sign off as renal adjustment is not necessary  Lawana Pai R 07/20/2014,5:09 AM

## 2014-07-20 NOTE — H&P (Signed)
[facility] History and Physical  [name] ALP:[id number] DOB: 07/30/1965 DOA: 07/20/2014  Referring physician: ER physician. PCP: Cathlean Cower, MD   Chief Complaint: Abdominal pain with Nausea vomiting.  HPI: [name] is a 49 y.o. female with history of cirrhosis of liver, alcoholism, hepatitis C, diabetes mellitus type 2 was transferred back from behavioral health of the patient was complaining of increasing abdominal pain. Patient had come to the ER 2 nights ago with complaints of abdominal pain and suicidal/homicidal thoughts. After patient was cleared medically patient was transferred to behavioral health and over there patient did not endorse any suicidal thoughts or homicidal thoughts and was placed for depression and alcohol detox. Patient was complaining of persistent pain and has had nausea vomiting and was transferred back to [facility]. CT abdomen and pelvis only shows cholelithiasis without any signs of cholecystitis and also shows ascites. Patient's LFTs are elevated and has been evaluated previously chronically. Patient still complains of abdominal pain mostly in the epigastric area with some nausea and vomiting. Denies any blood in the vomitus and denies any diarrhea. Denies any chest pain or shortness of breath. Patient's last alcohol intake was 24 hours ago.   Review of Systems: As presented in the history of presenting illness, rest negative.  Past Medical History  Diagnosis Date  . Impaired glucose tolerance 11/07/2010  . Hepatitis C approx dx 2005    no tx to date  . Pancreatitis     HX of  . Alcohol dependency     at least [place] admissions 2007- 09/2013 for detox.   Marland Kitchen GERD (gastroesophageal reflux disease)   . Chronic constipation   . Gallstones   . Drug dependency     Hx of cocaine use  . Anxiety and depression 08/2011  . Rheumatoid factor positive   . Hemochromatosis     last phlebotomy tx ~ 2012. liver bx 2006  . Pancytopenia   .  Abnormal vaginal bleeding     uterine fibroid  . Gallstone 2006  . Chronic abdominal pain 2011  . Cirrhosis 11/09/2010  . Type II or unspecified type diabetes mellitus without mention of complication, uncontrolled 09/17/2011  . Major depression 11/2012, 09/2013    [facility] admissions for this, suicide attempt by OD Ambien (2014), Prozac (2015),ETOHism   . Anxiety    Past Surgical History  Procedure Laterality Date  . Appendectomy    . Cesarean section  x 2  . Foot surgery  2013  . Abdominal hysterectomy  12/17/2011    Procedure: HYSTERECTOMY ABDOMINAL;  Surgeon: Gus Height, MD;  Location: [facility];  Service: Gynecology;  Laterality: N/A;  . Salpingoophorectomy  12/17/2011    Procedure: SALPINGO OOPHERECTOMY;  Surgeon: Gus Height, MD;  Location: [facility];  Service: Gynecology;  Laterality: Bilateral;  . Esophagogastroduodenoscopy N/A 12/10/2013    Procedure: ESOPHAGOGASTRODUODENOSCOPY (EGD);  Surgeon: Ladene Artist, MD;  Location: [facility];  Service: Endoscopy;  Laterality: N/A;  . Percutaneous liver biopsy  2011  . Colonoscopy N/A 02/18/2014    Procedure: COLONOSCOPY;  Surgeon: Gatha Mayer, MD;  Location: [place];  Service: Endoscopy;  Laterality: N/A;   Social History:  reports that she has never smoked. She has never used smokeless tobacco. She reports that she drinks alcohol. She reports that she does not use illicit drugs. Where does patient live at home. Can patient participate in ADLs? Yes.  Allergies  Allergen Reactions  . Betadine [Povidone Iodine] Hives    Family  History:  Family History  Problem Relation Age of Onset  . Cancer Mother     ovarian  . Diabetes Father       Prior to Admission medications   Medication Sig Start Date End Date Taking? Authorizing Provider  diphenhydrAMINE (BENADRYL) 25 MG tablet Take 50 mg by mouth every 6 (six) hours as needed for sleep.    Historical Provider, MD  esomeprazole (NEXIUM) 40 MG capsule Take 40 mg by mouth daily as needed  (heartburn).    Historical Provider, MD  ibuprofen (ADVIL,MOTRIN) 200 MG tablet Take 400 mg by mouth every 6 (six) hours as needed for moderate pain.    Historical Provider, MD  insulin aspart (NOVOLOG) 100 UNIT/ML injection Inject 20 Units into the skin 3 (three) times daily before meals.    Historical Provider, MD  insulin glargine (LANTUS) 100 UNIT/ML injection Inject 0.2 mLs (20 Units total) into the skin at bedtime. Patient taking differently: Inject 30 Units into the skin at bedtime.  07/14/14   Biagio Borg, MD  linagliptin (TRADJENTA) 5 MG TABS tablet Take 1 tablet (5 mg total) by mouth daily. For diabetes 09/28/13   Encarnacion Slates, NP  metFORMIN (GLUCOPHAGE) 500 MG tablet TAKE 1 TABLET (500 MG TOTAL) BY MOUTH 2 (TWO) TIMES DAILY WITH A MEAL. 07/14/14   Biagio Borg, MD  Multiple Vitamin (MULTIVITAMIN WITH MINERALS) TABS tablet Take 1 tablet by mouth daily.     Historical Provider, MD  sertraline (ZOLOFT) 100 MG tablet Take 150 mg by mouth daily.    Historical Provider, MD  tetrahydrozoline (VISINE) 0.05 % ophthalmic solution Place 1 drop into both eyes every morning.    Historical Provider, MD  zolpidem (AMBIEN) 10 MG tablet Take 1 tablet (10 mg total) by mouth at bedtime as needed for sleep. 07/18/14 08/17/14  Biagio Borg, MD    Physical Exam: Filed Vitals:   07/20/14 0230  BP: 135/80  Pulse: 87  Temp: 98.8 F (37.1 C)  TempSrc: Oral  Resp: 16  Height: 5\' 2"  (1.575 m)  Weight: 55.3 kg (121 lb 14.6 oz)  SpO2: 99%     General:  Moderately built and nourished.  Eyes: Anicteric no pallor.  ENT: No discharge from the ears eyes nose and mouth.  Neck: No mass felt.  Cardiovascular: S1-S2 heard.  Respiratory: No rhonchi or crepitations.  Abdomen: Soft nontender bowel sounds present. Mildly distended. No guarding or rigidity.  Skin: No rash.  Musculoskeletal: No edema.  Psychiatric: Appears normal.  Neurologic: Alert awake oriented to time place and person. Moves all  extremities.  Labs on Admission:  Basic Metabolic Panel:  Recent Labs Lab 07/18/14 0943 07/18/14 1708 07/19/14 2222  NA 130* 139 133*  K 3.8 3.4* 3.6  CL 93* 95* 96  CO2 26 31 29   GLUCOSE 648 Repeated and verified X2.* 420* 168*  BUN 6 5* 7  CREATININE 0.63 0.55 0.36*  CALCIUM 8.9 8.9 8.7   Liver Function Tests:  Recent Labs Lab 07/18/14 0943 07/18/14 1708 07/19/14 2222  AST 146* 168* 172*  ALT 104* 115* 93*  ALKPHOS 157* 168* 143*  BILITOT 1.8* 1.8* 2.6*  PROT 7.9 8.4* 7.4  ALBUMIN 3.3* 3.7 2.9*    Recent Labs Lab 07/18/14 2134 07/19/14 2222  LIPASE 105* 53   No results for input(s): AMMONIA in the last 168 hours. CBC:  Recent Labs Lab 07/18/14 0943 07/18/14 1708 07/19/14 2222  WBC 2.5* 3.6* 2.4*  NEUTROABS 1.4  --  1.4*  HGB 13.8 14.3 13.8  HCT 40.0 42.5 40.1  MCV 104.8* 106.8* 105.8*  PLT 60.0* 69* 39*   Cardiac Enzymes: No results for input(s): CKTOTAL, CKMB, CKMBINDEX, TROPONINI in the last 168 hours.  BNP (last 3 results) No results for input(s): BNP in the last 8760 hours.  ProBNP (last 3 results) No results for input(s): PROBNP in the last 8760 hours.  CBG:  Recent Labs Lab 07/18/14 2328 07/19/14 0820 07/19/14 1145 07/19/14 1702 07/19/14 2051  GLUCAP 294* 255* 126* 108* 181*    Radiological Exams on Admission: Ct Abdomen Pelvis W Contrast  07/20/2014   CLINICAL DATA:  Severe abdominal pain for days, increasing nausea and vomiting. History of hysterectomy and appendectomy.  EXAM: CT ABDOMEN AND PELVIS WITH CONTRAST  TECHNIQUE: Multidetector CT imaging of the abdomen and pelvis was performed using the standard protocol following bolus administration of intravenous contrast.  CONTRAST:  181mL OMNIPAQUE IOHEXOL 300 MG/ML SOLN, 34mL OMNIPAQUE IOHEXOL 300 MG/ML SOLN  COMPARISON:  Ultrasound 12 hours prior.  CT 12/24/2013  FINDINGS: Minimal atelectasis in the right and left lower lobe. Minimal left pleural thickening without effusion.   Nodular hepatic contour consistent with cirrhosis. No focal enhancing hepatic lesion. Small to moderate volume of perihepatic ascites. Portal vein is patent. Splenomegaly, the spleen measures 15.3 x 13.3 x 5.7 cm for a volume of 703 mL. Mild perisplenic varices.  The gallbladder is contracted with gallstones and gallbladder wall thickening. There is no biliary dilatation.  No peripancreatic inflammatory change or ductal dilatation. The adrenal glands are normal. Kidneys demonstrate symmetric enhancement and excretion. Sub centimeter cyst in the upper left kidney.  Stomach is physiologically distended. Small hiatal hernia. There are no dilated or thickened bowel loops. Moderate stool throughout the colon. Minimal colonic wall thickening in the cecum and ascending colon, may reflect portal enteropathy. The appendix is not visualized.  Small to moderate ascites in the right pericolic gutter and tracking into the pelvis. Abdominal aorta is normal in caliber . No retroperitoneal adenopathy. Mild whole body wall edema suggesting third-spacing.  Within the pelvis the bladder is physiologically distended. The uterus is surgically absent. No adnexal mass. There is no free air.  There are no acute or suspicious osseous abnormalities.  IMPRESSION: 1. Hepatic cirrhosis with portal hypertension and splenomegaly. No focal enhancing hepatic lesion. Splenomegaly is mildly progressed from prior exam. 2. Development of small to moderate volume of intra-abdominal ascites. 3. Cholelithiasis. Gallbladder wall thickening is a common finding in chronic liver disease. 4. Mild colonic wall thickening involving the cecum and ascending colon, may reflect portal enteropathy.   Electronically Signed   By: Jeb Levering M.D.   On: 07/20/2014 02:29   US Abdomen Limited  07/19/2014   CLINICAL DATA:  RIGHT upper quadrant pain, history of hepatitis-C.  EXAM: US ABDOMEN LIMITED - RIGHT UPPER QUADRANT  COMPARISON:  Abdominal ultrasound February 14, 2014 and CT of the abdomen and pelvis December 24, 2013  FINDINGS: Gallbladder:  Gallbladder is mildly distended, echogenic 7 mm layering gallstone with acoustic shadowing was present on prior CT. Gallbladder wall thickening up 4.7 mm. No sonographic Murphy's sign elicited. No pericholecystic fluid.  Common bile duct:  Diameter: 4.3 mm  Liver:  Lobulated contour consistent with known cirrhosis with partially imaged ascites.  IMPRESSION: Cholelithiasis. Mild gallbladder wall thickening most consistent with portal hypertension.  Cirrhosis and ascites.   Electronically Signed   By: Elon Alas   On: 07/19/2014 02:51     Assessment/Plan  Principal Problem:   Abdominal pain Active Problems:   Thrombocytopenia   Diabetes mellitus type 2, controlled   Cirrhosis of liver   Elevated LFTs   Alcoholism   1. Abdominal pain - CT abdomen does not show any definite evidence of pancreatitis at this time. Patient's pain could be most likely related to alcoholic gastritis. I have placed patient on Protonix IV. Full liquid diet which can be advanced as tolerated. Since patient's CAT scan does show gallstones I have ordered hida scan. Patient is empirically placed on antibiotics for possible SBP and I have ordered a sonogram guided paracentesis. 2. History of depression - presently no suicidal or suicidal ideation. Continue home medications. 3. Elevated LFTs most likely secondary to alcoholic hepatitis - follow LFTs closely. 4. History of alcoholism - on alcohol withdrawal protocol. 5. Diabetes mellitus type 2 - hold metformin as patient has received contrast. Continue with Lantus and sliding scale insulin closely follow CBGs and Lantus dose may need to be decreased if patient's oral intake is not adequate. 6. Chronic leukopenia and thrombocytopenia - probably secondary to cirrhosis and alcoholism. Follow CBC closely.   DVT Prophylaxis SCDs.  Code Status: Full code.  Family Communication: None.   Disposition Plan: Admit to inpatient.    Karalyne Nusser N. [facility] Pager [phone].  If 7PM-7AM, please contact night-coverage [email or web address] Password [email or web address] 07/20/2014, 5:07 AM

## 2014-07-20 NOTE — ED Provider Notes (Signed)
CSN: [id number]     Arrival date & time 07/19/14  2209 History   First MD Initiated Contact with Patient 07/19/14 2212     Chief Complaint  Patient presents with  . Abdominal Pain     (Consider location/radiation/quality/duration/timing/severity/associated sxs/prior Treatment) HPI   PCP: Cathlean Cower, MD Blood pressure 129/90, pulse 89, temperature 97.1 F (36.2 C), temperature source Oral, resp. rate 18, height 5\' 2"  (1.575 m), last menstrual period 11/01/2011, SpO2 96 %.  [name] is a 49 y.o.female with a significant PMH ofhepatitis, pancreatitis, ETOH dependency, gallstones, drug dependency, rheumatoid factor positive, hemochromatosis, pancytopenia, drug dependency, cirrhosis, diabetes  presents to the ER with complaints of intractable vomiting and abdominal pain. She was seen by myself yesterdays for homicidal ideation, abdominal pain, nausea, vomiting. She had abd [place] and labs done. These were not acute and her symptoms were managed. Sent her to behavioral health. They called over this evening and reported that she had been having severe abdominal pain, nausea and vomiting today. Unable to keep down PO and sent her to the ER.   Per the patient she continues to have pain, nausea and vomiting. Same pain as yesterday but worse.   Past Medical History  Diagnosis Date  . Impaired glucose tolerance 11/07/2010  . Hepatitis C approx dx 2005    no tx to date  . Pancreatitis     HX of  . Alcohol dependency     at least [place] admissions 2007- 09/2013 for detox.   Marland Kitchen GERD (gastroesophageal reflux disease)   . Chronic constipation   . Gallstones   . Drug dependency     Hx of cocaine use  . Anxiety and depression 08/2011  . Rheumatoid factor positive   . Hemochromatosis     last phlebotomy tx ~ 2012. liver bx 2006  . Pancytopenia   . Abnormal vaginal bleeding     uterine fibroid  . Gallstone 2006  . Chronic abdominal pain 2011  . Cirrhosis 11/09/2010  . Type II or unspecified type  diabetes mellitus without mention of complication, uncontrolled 09/17/2011  . Major depression 11/2012, 09/2013    [facility] admissions for this, suicide attempt by OD Ambien (2014), Prozac (2015),ETOHism   . Anxiety    Past Surgical History  Procedure Laterality Date  . Appendectomy    . Cesarean section  x 2  . Foot surgery  2013  . Abdominal hysterectomy  12/17/2011    Procedure: HYSTERECTOMY ABDOMINAL;  Surgeon: Gus Height, MD;  Location: [facility];  Service: Gynecology;  Laterality: N/A;  . Salpingoophorectomy  12/17/2011    Procedure: SALPINGO OOPHERECTOMY;  Surgeon: Gus Height, MD;  Location: [facility];  Service: Gynecology;  Laterality: Bilateral;  . Esophagogastroduodenoscopy N/A 12/10/2013    Procedure: ESOPHAGOGASTRODUODENOSCOPY (EGD);  Surgeon: Ladene Artist, MD;  Location: [facility];  Service: Endoscopy;  Laterality: N/A;  . Percutaneous liver biopsy  2011  . Colonoscopy N/A 02/18/2014    Procedure: COLONOSCOPY;  Surgeon: Gatha Mayer, MD;  Location: [place];  Service: Endoscopy;  Laterality: N/A;   Family History  Problem Relation Age of Onset  . Cancer Mother     ovarian  . Diabetes Father    History  Substance Use Topics  . Smoking status: Never Smoker   . Smokeless tobacco: Never Used  . Alcohol Use: Yes     Comment: one pint vodka daily years   OB History    Gravida Para Term Preterm AB TAB SAB Ectopic  Multiple Living   2 2 2       3      Review of Systems  10 Systems reviewed and are negative for acute change except as noted in the HPI.     Allergies  Betadine  Home Medications   Prior to Admission medications   Medication Sig Start Date End Date Taking? Authorizing Provider  diphenhydrAMINE (BENADRYL) 25 MG tablet Take 50 mg by mouth every 6 (six) hours as needed for sleep.   Yes Historical Provider, MD  esomeprazole (NEXIUM) 40 MG capsule Take 40 mg by mouth daily as needed (heartburn).   Yes Historical Provider, MD  insulin aspart (NOVOLOG) 100  UNIT/ML injection Inject 20 Units into the skin 3 (three) times daily before meals.   Yes Historical Provider, MD  insulin glargine (LANTUS) 100 UNIT/ML injection Inject 0.2 mLs (20 Units total) into the skin at bedtime. Patient taking differently: Inject 30 Units into the skin at bedtime.  07/14/14  Yes Biagio Borg, MD  linagliptin (TRADJENTA) 5 MG TABS tablet Take 1 tablet (5 mg total) by mouth daily. For diabetes 09/28/13  Yes Encarnacion Slates, NP  metFORMIN (GLUCOPHAGE) 500 MG tablet TAKE 1 TABLET (500 MG TOTAL) BY MOUTH 2 (TWO) TIMES DAILY WITH A MEAL. 07/14/14  Yes Biagio Borg, MD  Multiple Vitamin (MULTIVITAMIN WITH MINERALS) TABS tablet Take 1 tablet by mouth daily.    Yes Historical Provider, MD  sertraline (ZOLOFT) 100 MG tablet Take 150 mg by mouth daily.   Yes Historical Provider, MD  tetrahydrozoline (VISINE) 0.05 % ophthalmic solution Place 1 drop into both eyes every morning.   Yes Historical Provider, MD  ibuprofen (ADVIL,MOTRIN) 200 MG tablet Take 400 mg by mouth every 6 (six) hours as needed for moderate pain.    Historical Provider, MD  zolpidem (AMBIEN) 10 MG tablet Take 1 tablet (10 mg total) by mouth at bedtime as needed for sleep. 07/18/14 08/17/14  Biagio Borg, MD   BP 129/90 mmHg  Pulse 89  Temp(Src) 97.1 F (36.2 C) (Oral)  Resp 18  Ht 5\' 2"  (1.575 m)  SpO2 96%  LMP 11/01/2011 Physical Exam   Constitutional: She appears well-developed and well-nourished. No distress.  HENT:  Head: Normocephalic and atraumatic.  Eyes: Pupils are equal, round, and reactive to light.  Neck: Normal range of motion. Neck supple.  Cardiovascular: Normal rate and regular rhythm.  Pulmonary/Chest: Effort normal.  Abdominal: Soft. Bowel sounds are normal. She exhibits no distension. There is tenderness (diffuse, worse at the RUQ). There is no rigidity, no rebound and no guarding.  Neurological: She is alert.  Skin: Skin is warm and dry.  Psychiatric: Her speech is normal and behavior is  normal. Her mood appears anxious. She exhibits a depressed mood. She expresses homicidal ideation. She expresses no suicidal ideation. She expresses no homicidal plans at this time. She expresses no suicidal plans.  Nursing note and vitals reviewed.   ED Course  Procedures (including critical care time) Labs Review Labs Reviewed  GLUCOSE, CAPILLARY - Abnormal; Notable for the following:    Glucose-Capillary 108 (*)    All other components within normal limits  GLUCOSE, CAPILLARY - Abnormal; Notable for the following:    Glucose-Capillary 181 (*)    All other components within normal limits  COMPREHENSIVE METABOLIC PANEL - Abnormal; Notable for the following:    Sodium 133 (*)    Glucose, Bld 168 (*)    Creatinine, Ser 0.36 (*)  Albumin 2.9 (*)    AST 172 (*)    ALT 93 (*)    Alkaline Phosphatase 143 (*)    Total Bilirubin 2.6 (*)    All other components within normal limits  CBC WITH DIFFERENTIAL/PLATELET - Abnormal; Notable for the following:    WBC 2.4 (*)    RBC 3.79 (*)    MCV 105.8 (*)    MCH 36.4 (*)    Platelets 39 (*)    Neutro Abs 1.4 (*)    Monocytes Relative 14 (*)    All other components within normal limits  LIPASE, BLOOD    Imaging Review US Abdomen Limited  07/19/2014   CLINICAL DATA:  RIGHT upper quadrant pain, history of hepatitis-C.  EXAM: US ABDOMEN LIMITED - RIGHT UPPER QUADRANT  COMPARISON:  Abdominal ultrasound February 14, 2014 and CT of the abdomen and pelvis December 24, 2013  FINDINGS: Gallbladder:  Gallbladder is mildly distended, echogenic 7 mm layering gallstone with acoustic shadowing was present on prior CT. Gallbladder wall thickening up 4.7 mm. No sonographic Murphy's sign elicited. No pericholecystic fluid.  Common bile duct:  Diameter: 4.3 mm  Liver:  Lobulated contour consistent with known cirrhosis with partially imaged ascites.  IMPRESSION: Cholelithiasis. Mild gallbladder wall thickening most consistent with portal hypertension.  Cirrhosis  and ascites.   Electronically Signed   By: Elon Alas   On: 07/19/2014 02:51     EKG Interpretation None      MDM   Final diagnoses:  Nausea and vomiting  Intractable vomiting with nausea, vomiting of unspecified type  Alcoholic hepatitis without ascites  Abdominal pain, right upper quadrant  Alcohol dependence with unspecified alcohol-induced disorder  Type 2 diabetes mellitus without complication    Patients labs are different than yesterday.  Lipase is improved. Her liver function and bilirubin are worse.  Patient admitted to hospitalist - Dr. Hal Hope. He recommends CT abd/pelv w contrast for further evaluation.  Admit to med/surg, inpatient, [facility] admits.  -- pt still homicidal but not acting psychotic and is pleasant and cooperative.  Filed Vitals:   07/19/14 2212  BP: 129/90  Pulse: 89  Temp: 97.1 F (36.2 C)  Resp: [address], PA-C 07/20/14 0026  Noemi Chapel, MD 07/20/14 1416

## 2014-07-21 ENCOUNTER — Inpatient Hospital Stay (HOSPITAL_COMMUNITY): Payer: No Typology Code available for payment source

## 2014-07-21 DIAGNOSIS — F332 Major depressive disorder, recurrent severe without psychotic features: Secondary | ICD-10-CM

## 2014-07-21 DIAGNOSIS — R7989 Other specified abnormal findings of blood chemistry: Secondary | ICD-10-CM

## 2014-07-21 DIAGNOSIS — D696 Thrombocytopenia, unspecified: Secondary | ICD-10-CM

## 2014-07-21 LAB — COMPREHENSIVE METABOLIC PANEL
ALBUMIN: 2.8 g/dL — AB (ref 3.5–5.2)
ALT: 90 U/L — ABNORMAL HIGH (ref 0–35)
AST: 170 U/L — AB (ref 0–37)
Alkaline Phosphatase: 140 U/L — ABNORMAL HIGH (ref 39–117)
Anion gap: 5 (ref 5–15)
BILIRUBIN TOTAL: 2.6 mg/dL — AB (ref 0.3–1.2)
BUN: 8 mg/dL (ref 6–23)
CHLORIDE: 99 mmol/L (ref 96–112)
CO2: 33 mmol/L — AB (ref 19–32)
CREATININE: 0.44 mg/dL — AB (ref 0.50–1.10)
Calcium: 8.5 mg/dL (ref 8.4–10.5)
GFR calc Af Amer: >90 mL/min (ref >90)
GFR calc non Af Amer: >90 mL/min (ref >90)
Glucose, Bld: 102 mg/dL — ABNORMAL HIGH (ref 70–99)
Potassium: 3.7 mmol/L (ref 3.5–5.1)
Sodium: 137 mmol/L (ref 135–145)
Total Protein: 6.8 g/dL (ref 6.0–8.3)

## 2014-07-21 LAB — CBC WITH DIFFERENTIAL/PLATELET
BASOS ABS: 0 10*3/uL (ref 0.0–0.1)
Basophils Relative: 1 % (ref 0–1)
EOS ABS: 0.1 10*3/uL (ref 0.0–0.7)
Eosinophils Relative: 5 % (ref 0–5)
HCT: 41.4 % (ref 36.0–46.0)
Hemoglobin: 13.8 g/dL (ref 12.0–15.0)
LYMPHS PCT: 36 % (ref 12–46)
Lymphs Abs: 0.6 10*3/uL — ABNORMAL LOW (ref 0.7–4.0)
MCH: 36.3 pg — AB (ref 26.0–34.0)
MCHC: 33.3 g/dL (ref 30.0–36.0)
MCV: 108.9 fL — ABNORMAL HIGH (ref 78.0–100.0)
Monocytes Absolute: 0.3 10*3/uL (ref 0.1–1.0)
Monocytes Relative: 17 % — ABNORMAL HIGH (ref 3–12)
NEUTROS ABS: 0.8 10*3/uL — AB (ref 1.7–7.7)
NEUTROS PCT: 41 % — AB (ref 43–77)
Platelets: 40 10*3/uL — ABNORMAL LOW (ref 150–400)
RBC: 3.8 MIL/uL — ABNORMAL LOW (ref 3.87–5.11)
RDW: 14.2 % (ref 11.5–15.5)
WBC: 1.8 10*3/uL — ABNORMAL LOW (ref 4.0–10.5)

## 2014-07-21 LAB — HIV ANTIBODY (ROUTINE TESTING W REFLEX): HIV Screen 4th Generation wRfx: NONREACTIVE

## 2014-07-21 LAB — GLUCOSE, CAPILLARY
GLUCOSE-CAPILLARY: 102 mg/dL — AB (ref 70–99)
Glucose-Capillary: 99 mg/dL (ref 70–99)
Glucose-Capillary: 147 mg/dL — ABNORMAL HIGH (ref 70–99)
Glucose-Capillary: 178 mg/dL — ABNORMAL HIGH (ref 70–99)

## 2014-07-21 MED ORDER — TECHNETIUM TC 99M MEBROFENIN IV KIT
7.3000 | PACK | Freq: Once | INTRAVENOUS | Status: AC | PRN
  Administered 2014-07-21: 7 via INTRAVENOUS

## 2014-07-21 MED ORDER — OXYCODONE HCL 5 MG PO TABS
5.0000 mg | ORAL_TABLET | Freq: Four times a day (QID) | ORAL | Status: DC | PRN
  Administered 2014-07-21 (×2): 5 mg via ORAL
  Filled 2014-07-21 (×2): qty 1

## 2014-07-21 NOTE — Progress Notes (Signed)
CSW attempted to meet with Pt twice to complete assessment and provide necessary resources however Pt was asleep both times, requesting that CSW come back at later time. CSW will attempt to assess Pt again tomorrow.   Peri Maris, [place] 07/21/2014 3:26 PM [phone]

## 2014-07-21 NOTE — Progress Notes (Signed)
[facility] PROGRESS NOTE  [name] ZOX:[id number] DOB: 1965/08/06 DOA: 07/20/2014 PCP: Cathlean Cower, MD  Assessment/Plan: 1-Abdominal pain;  Covering for SBP. Continue with ceftriaxone.  S/P paracentesis: 3-30 yielding 180 cc. Follow culture.  Abdominal US; Cholelithiasis. Cirrhosis, ascites.  CT abdomen: 1. Hepatic cirrhosis with portal hypertension and splenomegaly. Splenomegaly is mildly progressed from prior exam.  Development of small to moderate volume of intra-abdominal ascites. Cholelithiasis. Gallbladder wall thickening is a common finding in chronic liver disease. Mild colonic wall thickening involving the cecum and ascending colon, may reflect portal enteropathy. Mildly elevated lipases.  HIDA scan negative.  Start oral pain medications, oxycodone.   History of depression - presently no suicidal or suicidal ideation. Continue home medications. Psych consulted.   Cirrhosis of liver: Elevated LFTs most likely secondary to alcoholic hepatitis - follow LFTs closely. Trending down, suspect secondary to alcohol intake.  Started spironolactone.   Diabetes mellitus type 2 decrease : uncontrolled.  Continue with lantus, 10 units, adjust as needed.   Chronic leukopenia and thrombocytopenia - probably secondary to cirrhosis and alcoholism. Follow CBC closely. Stable.    Code Status: Full code.  Family Communication: care discussed with patient Disposition Plan: remain inpatient.    Consultants:  none  Procedures:  Paracentesis.   HIDA negative.   Antibiotics:  Ceftriaxone 3-30  HPI/Subjective: Still with abdominal pain.  Had BM yesterday.   Objective: Filed Vitals:   07/21/14 0957  BP: 120/80  Pulse: 78  Temp: 98.5 F (36.9 C)  Resp: 20    Intake/Output Summary (Last 24 hours) at 07/21/14 1123 Last data filed at 07/21/14 0900  Gross per 24 hour  Intake    600 ml  Output      0 ml  Net    600 ml   Filed Weights   07/20/14 0230  Weight:  55.3 kg (121 lb 14.6 oz)    Exam:   General:  Alert in no distress.   Cardiovascular: S 1, S 2 RRR  Respiratory: CTA  Abdomen: BS present, distended, tenderness.   Musculoskeletal: no edema.   Data Reviewed: Basic Metabolic Panel:  Recent Labs Lab 07/18/14 0943 07/18/14 1708 07/19/14 2222 07/20/14 0720 07/21/14 0534  NA 130* 139 133* 136 137  K 3.8 3.4* 3.6 3.5 3.7  CL 93* 95* 96 99 99  CO2 26 31 29 30  33*  GLUCOSE 648 Repeated and verified X2.* 420* 168* 133* 102*  BUN 6 5* 7 7 8   CREATININE 0.63 0.55 0.36* 0.36* 0.44*  CALCIUM 8.9 8.9 8.7 8.2* 8.5   Liver Function Tests:  Recent Labs Lab 07/18/14 0943 07/18/14 1708 07/19/14 2222 07/20/14 0720 07/21/14 0534  AST 146* 168* 172* 147* 170*  ALT 104* 115* 93* 85* 90*  ALKPHOS 157* 168* 143* 125* 140*  BILITOT 1.8* 1.8* 2.6* 2.4* 2.6*  PROT 7.9 8.4* 7.4 6.7 6.8  ALBUMIN 3.3* 3.7 2.9* 2.7* 2.8*    Recent Labs Lab 07/18/14 2134 07/19/14 2222  LIPASE 105* 53   No results for input(s): AMMONIA in the last 168 hours. CBC:  Recent Labs Lab 07/18/14 0943 07/18/14 1708 07/19/14 2222 07/20/14 0720 07/21/14 0534  WBC 2.5* 3.6* 2.4* 1.8* 1.8*  NEUTROABS 1.4  --  1.4* 0.8* 0.8*  HGB 13.8 14.3 13.8 13.5 13.8  HCT 40.0 42.5 40.1 40.2 41.4  MCV 104.8* 106.8* 105.8* 107.2* 108.9*  PLT 60.0* 69* 39* 36* 40*   Cardiac Enzymes: No results for input(s): CKTOTAL, CKMB, CKMBINDEX, TROPONINI in the last 168  hours. BNP (last 3 results) No results for input(s): BNP in the last 8760 hours.  ProBNP (last 3 results) No results for input(s): PROBNP in the last 8760 hours.  CBG:  Recent Labs Lab 07/20/14 0739 07/20/14 1150 07/20/14 1635 07/20/14 2059 07/21/14 0936  GLUCAP 146* 178* 286* 141* 102*    Recent Results (from the past 240 hour(s))  Body fluid culture     Status: None (Preliminary result)   Collection Time: 07/20/14 10:00 AM  Result Value Ref Range Status   Specimen Description ASCITIC  Final    Special Requests NONE  Final   Gram Stain   Final    NO WBC SEEN NO ORGANISMS SEEN Performed at [facility]    Culture NO GROWTH Performed at [facility]   Final   Report Status PENDING  Incomplete     Studies: Nm Hepatobiliary Liver Func  07/21/2014   CLINICAL DATA:  Gallstones, abdominal drain subsequent evaluation  EXAM: NUCLEAR MEDICINE HEPATOBILIARY IMAGING  TECHNIQUE: Sequential images of the abdomen were obtained out to 60 minutes following intravenous administration of radiopharmaceutical.  RADIOPHARMACEUTICALS:  7.5 [employer] CH-85I Choletec  COMPARISON:  07/19/2014 and 07/20/2014 CT and ultrasound  FINDINGS: There is prompt attached either uptake. Gallbladder uptake is seen at 5 min. Small bowel uptake this is identified 35 min.  IMPRESSION: Normal examination   Electronically Signed   By: Skipper Cliche M.D.   On: 07/21/2014 09:48   Ct Abdomen Pelvis W Contrast  07/20/2014   CLINICAL DATA:  Severe abdominal pain for days, increasing nausea and vomiting. History of hysterectomy and appendectomy.  EXAM: CT ABDOMEN AND PELVIS WITH CONTRAST  TECHNIQUE: Multidetector CT imaging of the abdomen and pelvis was performed using the standard protocol following bolus administration of intravenous contrast.  CONTRAST:  145mL OMNIPAQUE IOHEXOL 300 MG/ML SOLN, 65mL OMNIPAQUE IOHEXOL 300 MG/ML SOLN  COMPARISON:  Ultrasound 12 hours prior.  CT 12/24/2013  FINDINGS: Minimal atelectasis in the right and left lower lobe. Minimal left pleural thickening without effusion.  Nodular hepatic contour consistent with cirrhosis. No focal enhancing hepatic lesion. Small to moderate volume of perihepatic ascites. Portal vein is patent. Splenomegaly, the spleen measures 15.3 x 13.3 x 5.7 cm for a volume of 703 mL. Mild perisplenic varices.  The gallbladder is contracted with gallstones and gallbladder wall thickening. There is no biliary dilatation.  No peripancreatic inflammatory change or  ductal dilatation. The adrenal glands are normal. Kidneys demonstrate symmetric enhancement and excretion. Sub centimeter cyst in the upper left kidney.  Stomach is physiologically distended. Small hiatal hernia. There are no dilated or thickened bowel loops. Moderate stool throughout the colon. Minimal colonic wall thickening in the cecum and ascending colon, may reflect portal enteropathy. The appendix is not visualized.  Small to moderate ascites in the right pericolic gutter and tracking into the pelvis. Abdominal aorta is normal in caliber . No retroperitoneal adenopathy. Mild whole body wall edema suggesting third-spacing.  Within the pelvis the bladder is physiologically distended. The uterus is surgically absent. No adnexal mass. There is no free air.  There are no acute or suspicious osseous abnormalities.  IMPRESSION: 1. Hepatic cirrhosis with portal hypertension and splenomegaly. No focal enhancing hepatic lesion. Splenomegaly is mildly progressed from prior exam. 2. Development of small to moderate volume of intra-abdominal ascites. 3. Cholelithiasis. Gallbladder wall thickening is a common finding in chronic liver disease. 4. Mild colonic wall thickening involving the cecum and ascending colon, may reflect portal enteropathy.  Electronically Signed   By: Jeb Levering M.D.   On: 07/20/2014 02:29   US Paracentesis  07/20/2014   CLINICAL DATA:  Abdominal pain. Ascites. Request paracentesis for possible spontaneous bacterial peritonitis.  EXAM: ULTRASOUND GUIDED PARACENTESIS  COMPARISON:  None.  PROCEDURE: An ultrasound guided paracentesis was thoroughly discussed with the patient and questions answered. The benefits, risks, alternatives and complications were also discussed. The patient understands and wishes to proceed with the procedure. Written consent was obtained.  Ultrasound was performed to localize and mark an adequate pocket of fluid in the right lower quadrant of the abdomen. The area was  then prepped and draped in the normal sterile fashion. 1% Lidocaine was used for local anesthesia. Under ultrasound guidance a 19 gauge Yueh catheter was introduced. Paracentesis was performed. The catheter was removed and a dressing applied.  COMPLICATIONS: None immediate  FINDINGS: A total of approximately 180 mL of clear, amber colored fluid was removed. A fluid sample was sent for laboratory analysis.  IMPRESSION: Successful ultrasound guided paracentesis yielding 180 mL of ascites.  Read by: Ascencion Dike PA-C   Electronically Signed   By: Jerilynn Mages.  Shick M.D.   On: 07/20/2014 11:35    Scheduled Meds: . cefTRIAXone (ROCEPHIN)  IV  2 g Intravenous Q24H  . folic acid  1 mg Oral Daily  . insulin aspart  0-9 Units Subcutaneous TID WC  . insulin glargine  10 Units Subcutaneous QHS  . LORazepam  0-4 mg Oral Q6H   Followed by  . [START ON 07/22/2014] LORazepam  0-4 mg Oral Q12H  . pantoprazole (PROTONIX) IV  40 mg Intravenous Q12H  . sertraline  150 mg Oral Daily  . spironolactone  12.5 mg Oral Daily  . thiamine  100 mg Oral Daily   Or  . thiamine  100 mg Intravenous Daily   Continuous Infusions:   Principal Problem:   MDD (major depressive disorder) Active Problems:   Thrombocytopenia   Abdominal pain   Diabetes mellitus type 2, controlled   Cirrhosis of liver   Elevated LFTs   Alcoholism    Time spent: 35 minutes.     Niel Hummer A  [facility] Pager [phone]. If 7PM-7AM, please contact night-coverage at [email or web address], password [facility] 07/21/2014, 11:23 AM  LOS: 1 day

## 2014-07-21 NOTE — Consult Note (Signed)
[facility] Consult   Reason for Consult:  Depression, suicide ideation, abdominal pain and alcohol dependence Referring Physician:  Dr. Tyrell Antonio Patient Identification: [name] MRN:  [id number] Principal Diagnosis: MDD (major depressive disorder) Diagnosis:   Patient Active Problem List   Diagnosis Date Noted  . Intractable vomiting [R11.10] 07/20/2014  . Abdominal pain [R10.9] 07/20/2014  . Diabetes mellitus type 2, controlled [E11.9] 07/20/2014  . Cirrhosis of liver [K74.60] 07/20/2014  . Elevated LFTs [R79.89] 07/20/2014  . Alcoholism [F10.20] 07/20/2014  . Alcohol dependence with unspecified alcohol-induced disorder [F10.29]   . Severe recurrent major depression without psychotic features [F33.2] 07/19/2014  . Wellness examination [Z01.89] 07/14/2014  . Colon polyps [K63.5] 02/18/2014  . 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 GIB (gastrointestinal bleeding) [K92.2] 02/14/2014  . Thrombocytopenia [D69.6] 02/14/2014  . Alcoholic hepatitis [E56.31] 02/14/2014  . Abdominal pain, right upper quadrant [R10.11] 12/09/2013  . Acute alcohol intoxication [F10.129] 12/09/2013  . Hematemesis [K92.0] 12/09/2013  . Abdominal pain, generalized [R10.84] 12/09/2013  . Cholecystitis [K81.9] 12/08/2013  . Partial small bowel obstruction [K56.69] 12/08/2013  . MDD (major depressive disorder) [F32.2] 09/23/2013  . Alcohol dependence [F10.20] 12/17/2012  . Cocaine abuse [F14.10] 12/17/2012  . DM (diabetes mellitus), type 2 [E11.9] 09/17/2011  . Cirrhosis [K74.60] 11/09/2010  . Acute hepatitis C virus infection [B17.10] 05/29/2007    Total Time spent with patient: 1 hour  Subjective:   [name] is a 49 y.o. female patient admitted with Depression, suicide ideation, abdominal pain and alcohol dependence.  HPI:  [name] is a 49 y.o. female seen, chart reviewed for psychiatric consultation and evaluation of depression, suicidal ideation, alcohol  intoxication and abdominal pain. Patient was initially evaluated at behavioral health Hospital and then referred to the [facility] long emergency department for medical clearance. Patient was admitted to [facility] medical floor secondary to cirrhosis of liver and abdominal pain probably secondary to alcohol intoxication/dependence. Patient denies current symptoms of alcohol withdrawal and stated she has anxiety and hand shakes yesterday. Patient stated she wanted to quit drinking forever but not interested to go to residential substance abuse rehabilitation treatment. Patient stated she has a grandchild on the way.  patient has a history of substance abuse rehabilitation at [facility] about 4 years ago then sober about a year and again at [facility] about 2 years ago and stayed sober for 6 months. Patient reported she has been drinking 2-3 times a week instead of daily. Patient also reported her husband has been abusing when he gets trunk and he has been drinking every day. Reportedly more arguments and verbal abuse but no known physical abuse. Patient denies current symptoms of suicidal ideation, homicidal ideation, intention or plans. Patient has no evidence of psychosis. Patient wants to go home and she was medically stable. Patient was informed that she does not required alcohol detox treatment after hospitalization and referred for the rehabilitation. She was also elevated that social service can meet with her and provided a available options if she changed her mind.  Medical history: Patient with history of cirrhosis of liver, alcoholism, hepatitis C, diabetes mellitus type 2 was transferred back from behavioral health of the patient was complaining of increasing abdominal pain. Patient had come to the ER 2 nights ago with complaints of abdominal pain and suicidal/homicidal thoughts. After patient was cleared medically patient was transferred to behavioral health and over there patient did not endorse  any suicidal thoughts or homicidal thoughts and was  placed for depression and alcohol detox. Patient was complaining of persistent pain and has had nausea vomiting and was transferred back to [facility]. CT abdomen and pelvis only shows cholelithiasis without any signs of cholecystitis and also shows ascites. Patient's LFTs are elevated and has been evaluated previously chronically. Patient still complains of abdominal pain mostly in the epigastric area with some nausea and vomiting. Denies any blood in the vomitus and denies any diarrhea. Denies any chest pain or shortness of breath. Patient's last alcohol intake was 24 hours ago.   HPI Elements:   Location:  Depression and alcohol intoxication/abuse. Quality:  Poor. Severity:  Severe abdominal pain and continued to drink. Timing:  Alcohol intoxication on arrival. Duration:  More than 6 months. Context:  Lives with husband who is a disabled and drinking regularly together.  Past Medical History:  Past Medical History  Diagnosis Date  . Impaired glucose tolerance 11/07/2010  . Hepatitis C approx dx 2005    no tx to date  . Pancreatitis     HX of  . Alcohol dependency     at least [place] admissions 2007- 09/2013 for detox.   Marland Kitchen GERD (gastroesophageal reflux disease)   . Chronic constipation   . Gallstones   . Drug dependency     Hx of cocaine use  . Anxiety and depression 08/2011  . Rheumatoid factor positive   . Hemochromatosis     last phlebotomy tx ~ 2012. liver bx 2006  . Pancytopenia   . Abnormal vaginal bleeding     uterine fibroid  . Gallstone 2006  . Chronic abdominal pain 2011  . Cirrhosis 11/09/2010  . Type II or unspecified type diabetes mellitus without mention of complication, uncontrolled 09/17/2011  . Major depression 11/2012, 09/2013    [facility] admissions for this, suicide attempt by OD Ambien (2014), Prozac (2015),ETOHism   . Anxiety     Past Surgical History  Procedure Laterality Date  . Appendectomy    .  Cesarean section  x 2  . Foot surgery  2013  . Abdominal hysterectomy  12/17/2011    Procedure: HYSTERECTOMY ABDOMINAL;  Surgeon: Gus Height, MD;  Location: [facility];  Service: Gynecology;  Laterality: N/A;  . Salpingoophorectomy  12/17/2011    Procedure: SALPINGO OOPHERECTOMY;  Surgeon: Gus Height, MD;  Location: [facility];  Service: Gynecology;  Laterality: Bilateral;  . Esophagogastroduodenoscopy N/A 12/10/2013    Procedure: ESOPHAGOGASTRODUODENOSCOPY (EGD);  Surgeon: Ladene Artist, MD;  Location: [facility];  Service: Endoscopy;  Laterality: N/A;  . Percutaneous liver biopsy  2011  . Colonoscopy N/A 02/18/2014    Procedure: COLONOSCOPY;  Surgeon: Gatha Mayer, MD;  Location: [facility];  Service: Endoscopy;  Laterality: N/A;   Family History:  Family History  Problem Relation Age of Onset  . Cancer Mother     ovarian  . Diabetes Father    Social History:  History  Alcohol Use  . Yes    Comment: one pint vodka daily years     History  Drug Use No    Comment: denied all drugs during admission    History   Social History  . Marital Status: Married    Spouse Name: N/A  . Number of Children: 3  . Years of Education: N/A   Occupational History  . formed [employer]     now staying home   Social History Main Topics  . Smoking status: Never Smoker   . Smokeless tobacco: Never Used  .  Alcohol Use: Yes     Comment: one pint vodka daily years  . Drug Use: No     Comment: denied all drugs during admission  . Sexual Activity: Not Currently    Birth Control/ Protection: None   Other Topics Concern  . None   Social History Narrative   Additional Social History:                          Allergies:   Allergies  Allergen Reactions  . Betadine [Povidone Iodine] Hives    Labs:  Results for orders placed or performed during the hospital encounter of 07/20/14 (from the past 48 hour(s))  Urinalysis, Routine w reflex microscopic     Status: Abnormal    Collection Time: 07/20/14  5:33 AM  Result Value Ref Range   Color, Urine YELLOW YELLOW   APPearance CLEAR CLEAR   Specific Gravity, Urine 1.023 1.005 - 1.030   pH 7.5 5.0 - 8.0   Glucose, UA NEGATIVE NEGATIVE mg/dL   Hgb urine dipstick NEGATIVE NEGATIVE   Bilirubin Urine NEGATIVE NEGATIVE   Ketones, ur NEGATIVE NEGATIVE mg/dL   Protein, ur NEGATIVE NEGATIVE mg/dL   Urobilinogen, UA 1.0 0.0 - 1.0 mg/dL   Nitrite NEGATIVE NEGATIVE   Leukocytes, UA MODERATE (A) NEGATIVE  Urine microscopic-add on     Status: Abnormal   Collection Time: 07/20/14  5:33 AM  Result Value Ref Range   Squamous Epithelial / LPF RARE RARE   WBC, UA 3-6 <3 WBC/hpf   RBC / HPF 0-2 <3 RBC/hpf   Bacteria, UA FEW (A) RARE  Hepatic function panel     Status: Abnormal   Collection Time: 07/20/14  7:20 AM  Result Value Ref Range   Total Protein 6.7 6.0 - 8.3 g/dL   Albumin 2.7 (L) 3.5 - 5.2 g/dL   AST 147 (H) 0 - 37 U/L   ALT 85 (H) 0 - 35 U/L   Alkaline Phosphatase 125 (H) 39 - 117 U/L   Total Bilirubin 2.4 (H) 0.3 - 1.2 mg/dL   Bilirubin, Direct 1.3 (H) 0.0 - 0.5 mg/dL   Indirect Bilirubin 1.1 (H) 0.3 - 0.9 mg/dL  Protime-INR     Status: Abnormal   Collection Time: 07/20/14  7:20 AM  Result Value Ref Range   Prothrombin Time 15.8 (H) 11.6 - 15.2 seconds   INR 1.25 0.00 - 7.56  Basic metabolic panel     Status: Abnormal   Collection Time: 07/20/14  7:20 AM  Result Value Ref Range   Sodium 136 135 - 145 mmol/L   Potassium 3.5 3.5 - 5.1 mmol/L   Chloride 99 96 - 112 mmol/L   CO2 30 19 - 32 mmol/L   Glucose, Bld 133 (H) 70 - 99 mg/dL   BUN 7 6 - 23 mg/dL   Creatinine, Ser 0.36 (L) 0.50 - 1.10 mg/dL   Calcium 8.2 (L) 8.4 - 10.5 mg/dL   GFR calc non Af Amer >90 >90 mL/min   GFR calc Af Amer >90 >90 mL/min    Comment: (NOTE) The eGFR has been calculated using the CKD EPI equation. This calculation has not been validated in all clinical situations. eGFR's persistently <90 mL/min signify possible Chronic  Kidney Disease.    Anion gap 7 5 - 15  CBC with Differential/Platelet     Status: Abnormal   Collection Time: 07/20/14  7:20 AM  Result Value Ref Range   WBC 1.8 (L)  4.0 - 10.5 K/uL   RBC 3.75 (L) 3.87 - 5.11 MIL/uL   Hemoglobin 13.5 12.0 - 15.0 g/dL   HCT 40.2 36.0 - 46.0 %   MCV 107.2 (H) 78.0 - 100.0 fL   MCH 36.0 (H) 26.0 - 34.0 pg   MCHC 33.6 30.0 - 36.0 g/dL   RDW 14.1 11.5 - 15.5 %   Platelets 36 (L) 150 - 400 K/uL    Comment: REPEATED TO VERIFY SPECIMEN CHECKED FOR CLOTS PLATELET COUNT CONFIRMED BY SMEAR    Neutrophils Relative % 44 43 - 77 %   Lymphocytes Relative 35 12 - 46 %   Monocytes Relative 17 (H) 3 - 12 %   Eosinophils Relative 3 0 - 5 %   Basophils Relative 1 0 - 1 %   Neutro Abs 0.8 (L) 1.7 - 7.7 K/uL   Lymphs Abs 0.6 (L) 0.7 - 4.0 K/uL   Monocytes Absolute 0.3 0.1 - 1.0 K/uL   Eosinophils Absolute 0.1 0.0 - 0.7 K/uL   Basophils Absolute 0.0 0.0 - 0.1 K/uL   WBC Morphology WHITE COUNT CONFIRMED ON SMEAR    Smear Review PLATELET COUNT CONFIRMED BY SMEAR   Acetaminophen level     Status: Abnormal   Collection Time: 07/20/14  7:20 AM  Result Value Ref Range   Acetaminophen (Tylenol), Serum <10.0 (L) 10 - 30 ug/mL    Comment:        THERAPEUTIC CONCENTRATIONS VARY SIGNIFICANTLY. A RANGE OF 10-30 ug/mL MAY BE AN EFFECTIVE CONCENTRATION FOR MANY PATIENTS. HOWEVER, SOME ARE BEST TREATED AT CONCENTRATIONS OUTSIDE THIS RANGE. ACETAMINOPHEN CONCENTRATIONS >150 ug/mL AT 4 HOURS AFTER INGESTION AND >50 ug/mL AT 12 HOURS AFTER INGESTION ARE OFTEN ASSOCIATED WITH TOXIC REACTIONS.   Pathologist smear review     Status: None   Collection Time: 07/20/14  7:20 AM  Result Value Ref Range   Path Review Reviewed By Violet Baldy, M.D.     Comment: ON 03.30.16 LEUKOPENIA THROMBOCYTOPENIA   Glucose, capillary     Status: Abnormal   Collection Time: 07/20/14  7:39 AM  Result Value Ref Range   Glucose-Capillary 146 (H) 70 - 99 mg/dL  Body fluid cell count  with differential     Status: Abnormal   Collection Time: 07/20/14 10:00 AM  Result Value Ref Range   Fluid Type-FCT ASCITIC     Comment: CORRECTED ON 03/30 AT 1147: PREVIOUSLY REPORTED AS ABDOMEN   Color, Fluid AMBER (A) YELLOW   Appearance, Fluid HAZY (A) CLEAR   WBC, Fluid 152 0 - 1000 cu mm   Lymphs, Fluid 58 %   Monocyte-Macrophage-Serous Fluid 42 (L) 50 - 90 %   Other Cells, Fluid CORRELATE WITH CYTOLOGY. %    Comment: FEW LINING CELLS PRESENT  Body fluid culture     Status: None (Preliminary result)   Collection Time: 07/20/14 10:00 AM  Result Value Ref Range   Specimen Description ASCITIC    Special Requests NONE    Gram Stain      NO WBC SEEN NO ORGANISMS SEEN Performed at [facility]    Culture NO GROWTH Performed at [facility]     Report Status PENDING   Glucose, pleural or peritoneal fluid     Status: None   Collection Time: 07/20/14 10:00 AM  Result Value Ref Range   Glucose, Fluid 256 mg/dL    Comment: (NOTE) No normal range established for this test Results should be evaluated in conjunction with serum values  Performed at [facility]    Fluid Type-FGLU ASCITIC     Comment: CORRECTED ON 03/30 AT 1147: PREVIOUSLY REPORTED AS ABDOMEN  Glucose, capillary     Status: Abnormal   Collection Time: 07/20/14 11:50 AM  Result Value Ref Range   Glucose-Capillary 178 (H) 70 - 99 mg/dL  Glucose, capillary     Status: Abnormal   Collection Time: 07/20/14  4:35 PM  Result Value Ref Range   Glucose-Capillary 286 (H) 70 - 99 mg/dL  Glucose, capillary     Status: Abnormal   Collection Time: 07/20/14  8:59 PM  Result Value Ref Range   Glucose-Capillary 141 (H) 70 - 99 mg/dL  Comprehensive metabolic panel     Status: Abnormal   Collection Time: 07/21/14  5:34 AM  Result Value Ref Range   Sodium 137 135 - 145 mmol/L   Potassium 3.7 3.5 - 5.1 mmol/L   Chloride 99 96 - 112 mmol/L   CO2 33 (H) 19 - 32 mmol/L   Glucose, Bld 102 (H) 70 - 99 mg/dL    BUN 8 6 - 23 mg/dL   Creatinine, Ser 0.44 (L) 0.50 - 1.10 mg/dL   Calcium 8.5 8.4 - 10.5 mg/dL   Total Protein 6.8 6.0 - 8.3 g/dL   Albumin 2.8 (L) 3.5 - 5.2 g/dL   AST 170 (H) 0 - 37 U/L   ALT 90 (H) 0 - 35 U/L   Alkaline Phosphatase 140 (H) 39 - 117 U/L   Total Bilirubin 2.6 (H) 0.3 - 1.2 mg/dL   GFR calc non Af Amer >90 >90 mL/min   GFR calc Af Amer >90 >90 mL/min    Comment: (NOTE) The eGFR has been calculated using the CKD EPI equation. This calculation has not been validated in all clinical situations. eGFR's persistently <90 mL/min signify possible Chronic Kidney Disease.    Anion gap 5 5 - 15  CBC with Differential/Platelet     Status: Abnormal   Collection Time: 07/21/14  5:34 AM  Result Value Ref Range   WBC 1.8 (L) 4.0 - 10.5 K/uL   RBC 3.80 (L) 3.87 - 5.11 MIL/uL   Hemoglobin 13.8 12.0 - 15.0 g/dL   HCT 41.4 36.0 - 46.0 %   MCV 108.9 (H) 78.0 - 100.0 fL   MCH 36.3 (H) 26.0 - 34.0 pg   MCHC 33.3 30.0 - 36.0 g/dL   RDW 14.2 11.5 - 15.5 %   Platelets 40 (L) 150 - 400 K/uL    Comment: CONSISTENT WITH PREVIOUS RESULT   Neutrophils Relative % 41 (L) 43 - 77 %   Lymphocytes Relative 36 12 - 46 %   Monocytes Relative 17 (H) 3 - 12 %   Eosinophils Relative 5 0 - 5 %   Basophils Relative 1 0 - 1 %   Neutro Abs 0.8 (L) 1.7 - 7.7 K/uL   Lymphs Abs 0.6 (L) 0.7 - 4.0 K/uL   Monocytes Absolute 0.3 0.1 - 1.0 K/uL   Eosinophils Absolute 0.1 0.0 - 0.7 K/uL   Basophils Absolute 0.0 0.0 - 0.1 K/uL   Smear Review MORPHOLOGY UNREMARKABLE   Glucose, capillary     Status: Abnormal   Collection Time: 07/21/14  9:36 AM  Result Value Ref Range   Glucose-Capillary 102 (H) 70 - 99 mg/dL    Vitals: Blood pressure 120/80, pulse 78, temperature 98.5 F (36.9 C), temperature source Oral, resp. rate 20, height 5' 2" (1.575 m), weight 55.3 kg (121 lb 14.6  oz), last menstrual period 11/01/2011, SpO2 95 %.  Risk to Self: Is patient at risk for suicide?: No Risk to Others:   Prior  Inpatient Therapy:   Prior Outpatient Therapy:    Current Facility-Administered Medications  Medication Dose Route Frequency Provider Last Rate Last Dose  . cefTRIAXone (ROCEPHIN) 2 g in dextrose 5 % 50 mL IVPB - Premix  2 g Intravenous Q24H Dorrene German, RPH   2 g at 07/21/14 0554  . diphenhydrAMINE (BENADRYL) capsule 50 mg  50 mg Oral Q6H PRN Rise Patience, MD      . folic acid (FOLVITE) tablet 1 mg  1 mg Oral Daily Rise Patience, MD   1 mg at 07/20/14 1145  . insulin aspart (novoLOG) injection 0-9 Units  0-9 Units Subcutaneous TID WC Rise Patience, MD   5 Units at 07/20/14 1735  . insulin glargine (LANTUS) injection 10 Units  10 Units Subcutaneous QHS Belkys A Regalado, MD   10 Units at 07/20/14 2131  . LORazepam (ATIVAN) tablet 1 mg  1 mg Oral Q6H PRN Rise Patience, MD       Or  . LORazepam (ATIVAN) injection 1 mg  1 mg Intravenous Q6H PRN Rise Patience, MD   1 mg at 07/21/14 0228  . LORazepam (ATIVAN) tablet 0-4 mg  0-4 mg Oral Q6H Rise Patience, MD   1 mg at 07/20/14 2311   Followed by  . [START ON 07/22/2014] LORazepam (ATIVAN) tablet 0-4 mg  0-4 mg Oral Q12H Rise Patience, MD      . morphine 2 MG/ML injection 1-2 mg  1-2 mg Intravenous Q3H PRN Belkys A Regalado, MD   2 mg at 07/21/14 0933  . ondansetron (ZOFRAN) tablet 4 mg  4 mg Oral Q6H PRN Rise Patience, MD       Or  . ondansetron [facility]) injection 4 mg  4 mg Intravenous Q6H PRN Rise Patience, MD      . pantoprazole (PROTONIX) injection 40 mg  40 mg Intravenous Q12H Rise Patience, MD   40 mg at 07/20/14 2131  . sertraline (ZOLOFT) tablet 150 mg  150 mg Oral Daily Rise Patience, MD   150 mg at 07/20/14 1146  . spironolactone (ALDACTONE) tablet 12.5 mg  12.5 mg Oral Daily Belkys A Regalado, MD   12.5 mg at 07/20/14 1620  . thiamine (VITAMIN B-1) tablet 100 mg  100 mg Oral Daily Rise Patience, MD       Or  . thiamine (B-1) injection 100 mg  100 mg Intravenous  Daily Rise Patience, MD   100 mg at 07/20/14 1145  . zolpidem (AMBIEN) tablet 5 mg  5 mg Oral QHS PRN Rise Patience, MD        Musculoskeletal: Strength & Muscle Tone: decreased Gait & Station: unable to stand Patient leans: N/A  Psychiatric Specialty Exam: Physical ExamAs per history and physical   ROSFeeling tired, anxious, depressed and abdominal pain   Blood pressure 120/80, pulse 78, temperature 98.5 F (36.9 C), temperature source Oral, resp. rate 20, height 5' 2" (1.575 m), weight 55.3 kg (121 lb 14.6 oz), last menstrual period 11/01/2011, SpO2 95 %.Body mass index is 22.29 kg/(m^2).  General Appearance: Guarded  Eye Contact::  Good  Speech:  Clear and Coherent and Slow  Volume:  Decreased  Mood:  Depressed  Affect:  Congruent and Depressed  Thought Process:  Coherent and Goal Directed  Orientation:  Full (Time, Place, and Person)  Thought Content:  Rumination  Suicidal Thoughts:  No  Homicidal Thoughts:  No  Memory:  Immediate;   Fair Recent;   Fair  Judgement:  Impaired  Insight:  Lacking  Psychomotor Activity:  Decreased  Concentration:  Fair  Recall:  [employer] of Knowledge:Good  Language: Good  Akathisia:  Negative  Handed:  Right  AIMS (if indicated):     Assets:  Communication Skills Desire for Improvement Housing Leisure Time Resilience Social Support  ADL's:  Impaired  Cognition: WNL  Sleep:      Medical Decision Making: New problem, with additional work up planned, Review of Psycho-Social Stressors (1), Review or order clinical lab tests (1), Decision to obtain old records (1), Established Problem, Worsening (2), Review of Last Therapy Session (1), Review or order medicine tests (1), Review of Medication Regimen & Side Effects (2) and Review of New Medication or Change in Dosage (2)  Treatment Plan Summary: Daily contact with patient to assess and evaluate symptoms and progress in treatment and Medication management  Plan: Continue  Ativan alcohol detox treatment and ciwa protocol Continue Zoloft 150 mg daily for depression  Patient does not meet criteria for psychiatric inpatient admission. Supportive therapy provided about ongoing stressors. Refer to IOP.  Psychiatric social service will admit the patient and provide appropriate reference  Disposition: Refer to the substance abuse rehabilitation treatment when medically stable.   ,JANARDHAHA R. 07/21/2014 10:33 AM

## 2014-07-22 LAB — GLUCOSE, CAPILLARY
GLUCOSE-CAPILLARY: 148 mg/dL — AB (ref 70–99)
GLUCOSE-CAPILLARY: 165 mg/dL — AB (ref 70–99)
Glucose-Capillary: 147 mg/dL — ABNORMAL HIGH (ref 70–99)
Glucose-Capillary: 140 mg/dL — ABNORMAL HIGH (ref 70–99)

## 2014-07-22 LAB — COMPREHENSIVE METABOLIC PANEL
ALT: 94 U/L — ABNORMAL HIGH (ref 0–35)
AST: 160 U/L — ABNORMAL HIGH (ref 0–37)
Albumin: 2.6 g/dL — ABNORMAL LOW (ref 3.5–5.2)
Alkaline Phosphatase: 149 U/L — ABNORMAL HIGH (ref 39–117)
Anion gap: 7 (ref 5–15)
BILIRUBIN TOTAL: 2.3 mg/dL — AB (ref 0.3–1.2)
BUN: 12 mg/dL (ref 6–23)
CHLORIDE: 98 mmol/L (ref 96–112)
CO2: 30 mmol/L (ref 19–32)
Calcium: 8.6 mg/dL (ref 8.4–10.5)
Creatinine, Ser: 0.54 mg/dL (ref 0.50–1.10)
GFR calc Af Amer: >90 mL/min (ref >90)
Glucose, Bld: 159 mg/dL — ABNORMAL HIGH (ref 70–99)
Potassium: 3.8 mmol/L (ref 3.5–5.1)
Sodium: 135 mmol/L (ref 135–145)
Total Protein: 6.9 g/dL (ref 6.0–8.3)

## 2014-07-22 LAB — CBC WITH DIFFERENTIAL/PLATELET
Basophils Absolute: 0 10*3/uL (ref 0.0–0.1)
Basophils Relative: 0 % (ref 0–1)
Eosinophils Absolute: 0.1 10*3/uL (ref 0.0–0.7)
Eosinophils Relative: 3 % (ref 0–5)
HCT: 41.4 % (ref 36.0–46.0)
HEMOGLOBIN: 13.8 g/dL (ref 12.0–15.0)
LYMPHS ABS: 0.7 10*3/uL (ref 0.7–4.0)
LYMPHS PCT: 28 % (ref 12–46)
MCH: 36.3 pg — ABNORMAL HIGH (ref 26.0–34.0)
MCHC: 33.3 g/dL (ref 30.0–36.0)
MCV: 108.9 fL — ABNORMAL HIGH (ref 78.0–100.0)
MONOS PCT: 19 % — AB (ref 3–12)
Monocytes Absolute: 0.4 10*3/uL (ref 0.1–1.0)
NEUTROS ABS: 1.1 10*3/uL — AB (ref 1.7–7.7)
NEUTROS PCT: 49 % (ref 43–77)
PLATELETS: 54 10*3/uL — AB (ref 150–400)
RBC: 3.8 MIL/uL — AB (ref 3.87–5.11)
RDW: 14.1 % (ref 11.5–15.5)
WBC: 2.3 10*3/uL — ABNORMAL LOW (ref 4.0–10.5)

## 2014-07-22 MED ORDER — LACTULOSE 10 GM/15ML PO SOLN
30.0000 g | Freq: Every day | ORAL | Status: DC
  Administered 2014-07-22 – 2014-07-23 (×2): 30 g via ORAL
  Filled 2014-07-22 (×3): qty 45

## 2014-07-22 MED ORDER — OXYCODONE HCL 5 MG PO TABS
5.0000 mg | ORAL_TABLET | Freq: Four times a day (QID) | ORAL | Status: DC | PRN
  Administered 2014-07-22 – 2014-07-26 (×13): 10 mg via ORAL
  Filled 2014-07-22 (×13): qty 2

## 2014-07-22 MED ORDER — PANTOPRAZOLE SODIUM 40 MG PO TBEC
40.0000 mg | DELAYED_RELEASE_TABLET | Freq: Two times a day (BID) | ORAL | Status: DC
  Administered 2014-07-22 – 2014-07-26 (×9): 40 mg via ORAL
  Filled 2014-07-22 (×11): qty 1
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
## 2014-07-22 NOTE — Progress Notes (Signed)
PHARMACIST - PHYSICIAN COMMUNICATION DR:   Tyrell Antonio CONCERNING: IV to Oral Route Change Policy  RECOMMENDATION: This patient is receiving protonix and thiamine by the intravenous route.  Based on criteria approved by the Pharmacy and Therapeutics Committee, the intravenous medication(s) is/are being converted to the equivalent oral dose form(s).   DESCRIPTION: These criteria include:  The patient is eating (either orally or via tube) and/or has been taking other orally administered medications for a least 24 hours  The patient has no evidence of active gastrointestinal bleeding or impaired GI absorption (gastrectomy, short bowel, patient on TNA or NPO).  If you have questions about this conversion, please contact the Pharmacy Department  []   [phone] )  [facility] []   [phone] )  [facility] Pontes  []   [phone] )  [facility] [x]   [phone] )  [place], [facility] 07/22/2014 7:57 AM
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
## 2014-07-22 NOTE — Progress Notes (Signed)
[facility] PROGRESS NOTE  [name] URK:[id number] DOB: 04-04-1966 DOA: 07/20/2014 PCP: Cathlean Cower, MD  Assessment/Plan: 1-Abdominal pain;  Covering for SBP. Continue with ceftriaxone.  S/P paracentesis: 3-30 yielding 180 cc. Follow culture.  Abdominal US; Cholelithiasis. Cirrhosis, ascites.  CT abdomen: 1. Hepatic cirrhosis with portal hypertension and splenomegaly. Splenomegaly is mildly progressed from prior exam.  Development of small to moderate volume of intra-abdominal ascites. Cholelithiasis. Gallbladder wall thickening is a common finding in chronic liver disease. Mild colonic wall thickening involving the cecum and ascending colon, may reflect portal enteropathy. Mildly elevated lipases.  HIDA scan negative.  Will increase oxycodone.  Continue with ceftriaxone, covering for SBP, WBC increasing.    History of depression - presently no suicidal or suicidal ideation. Continue home medications. Psych consulted. Outpatient rehab for alcohol if patient agrees.   Cirrhosis of liver: Elevated LFTs most likely secondary to alcoholic hepatitis - follow LFTs closely. Trending down, suspect secondary to alcohol intake.  Started spironolactone.  LFT trending down.  Constipation; will start lactulose.   Diabetes mellitus type 2 decrease : uncontrolled.  Continue with lantus, 10 units, adjust as needed.   Chronic leukopenia and thrombocytopenia - probably secondary to cirrhosis and alcoholism. Follow CBC closely. Stable. WBC increasing.    Code Status: Full code.  Family Communication: care discussed with patient Disposition Plan: remain inpatient.    Consultants:  Psych  Procedures:  Paracentesis.   HIDA negative.   Antibiotics:  Ceftriaxone 3-30  HPI/Subjective: Pain is not well controlled with pain medications. No BM. Eating ok.   Objective: Filed Vitals:   07/22/14 0534  BP: 98/69  Pulse: 78  Temp: 98.2 F (36.8 C)  Resp: 16    Intake/Output  Summary (Last 24 hours) at 07/22/14 1041 Last data filed at 07/22/14 0939  Gross per 24 hour  Intake    360 ml  Output      0 ml  Net    360 ml   Filed Weights   07/20/14 0230  Weight: 55.3 kg (121 lb 14.6 oz)    Exam:   General:  Alert in no distress.   Cardiovascular: S 1, S 2 RRR  Respiratory: CTA  Abdomen: BS present, distended, tenderness.   Musculoskeletal: no edema.   Data Reviewed: Basic Metabolic Panel:  Recent Labs Lab 07/18/14 1708 07/19/14 2222 07/20/14 0720 07/21/14 0534 07/22/14 0530  NA 139 133* 136 137 135  K 3.4* 3.6 3.5 3.7 3.8  CL 95* 96 99 99 98  CO2 31 29 30  33* 30  GLUCOSE 420* 168* 133* 102* 159*  BUN 5* 7 7 8 12   CREATININE 0.55 0.36* 0.36* 0.44* 0.54  CALCIUM 8.9 8.7 8.2* 8.5 8.6   Liver Function Tests:  Recent Labs Lab 07/18/14 1708 07/19/14 2222 07/20/14 0720 07/21/14 0534 07/22/14 0530  AST 168* 172* 147* 170* 160*  ALT 115* 93* 85* 90* 94*  ALKPHOS 168* 143* 125* 140* 149*  BILITOT 1.8* 2.6* 2.4* 2.6* 2.3*  PROT 8.4* 7.4 6.7 6.8 6.9  ALBUMIN 3.7 2.9* 2.7* 2.8* 2.6*    Recent Labs Lab 07/18/14 2134 07/19/14 2222  LIPASE 105* 53   No results for input(s): AMMONIA in the last 168 hours. CBC:  Recent Labs Lab 07/18/14 0943 07/18/14 1708 07/19/14 2222 07/20/14 0720 07/21/14 0534 07/22/14 0530  WBC 2.5* 3.6* 2.4* 1.8* 1.8* 2.3*  NEUTROABS 1.4  --  1.4* 0.8* 0.8* 1.1*  HGB 13.8 14.3 13.8 13.5 13.8 13.8  HCT 40.0 42.5 40.1 40.2  41.4 41.4  MCV 104.8* 106.8* 105.8* 107.2* 108.9* 108.9*  PLT 60.0* 69* 39* 36* 40* 54*   Cardiac Enzymes: No results for input(s): CKTOTAL, CKMB, CKMBINDEX, TROPONINI in the last 168 hours. BNP (last 3 results) No results for input(s): BNP in the last 8760 hours.  ProBNP (last 3 results) No results for input(s): PROBNP in the last 8760 hours.  CBG:  Recent Labs Lab 07/21/14 0936 07/21/14 1202 07/21/14 1632 07/21/14 2147 07/22/14 0822  GLUCAP 102* 99 147* 178* 148*     Recent Results (from the past 240 hour(s))  Body fluid culture     Status: None (Preliminary result)   Collection Time: 07/20/14 10:00 AM  Result Value Ref Range Status   Specimen Description ASCITIC  Final   Special Requests NONE  Final   Gram Stain   Final    NO WBC SEEN NO ORGANISMS SEEN Performed at [facility]    Culture NO GROWTH Performed at [facility]   Final   Report Status PENDING  Incomplete     Studies: Nm Hepatobiliary Liver Func  07/21/2014   CLINICAL DATA:  Gallstones, abdominal drain subsequent evaluation  EXAM: NUCLEAR MEDICINE HEPATOBILIARY IMAGING  TECHNIQUE: Sequential images of the abdomen were obtained out to 60 minutes following intravenous administration of radiopharmaceutical.  RADIOPHARMACEUTICALS:  7.5 [place] AT-55D Choletec  COMPARISON:  07/19/2014 and 07/20/2014 CT and ultrasound  FINDINGS: There is prompt attached either uptake. Gallbladder uptake is seen at 5 min. Small bowel uptake this is identified 35 min.  IMPRESSION: Normal examination   Electronically Signed   By: Skipper Cliche M.D.   On: 07/21/2014 09:48   US Paracentesis  07/20/2014   CLINICAL DATA:  Abdominal pain. Ascites. Request paracentesis for possible spontaneous bacterial peritonitis.  EXAM: ULTRASOUND GUIDED PARACENTESIS  COMPARISON:  None.  PROCEDURE: An ultrasound guided paracentesis was thoroughly discussed with the patient and questions answered. The benefits, risks, alternatives and complications were also discussed. The patient understands and wishes to proceed with the procedure. Written consent was obtained.  Ultrasound was performed to localize and mark an adequate pocket of fluid in the right lower quadrant of the abdomen. The area was then prepped and draped in the normal sterile fashion. 1% Lidocaine was used for local anesthesia. Under ultrasound guidance a 19 gauge Yueh catheter was introduced. Paracentesis was performed. The catheter was removed and a  dressing applied.  COMPLICATIONS: None immediate  FINDINGS: A total of approximately 180 mL of clear, amber colored fluid was removed. A fluid sample was sent for laboratory analysis.  IMPRESSION: Successful ultrasound guided paracentesis yielding 180 mL of ascites.  Read by: Ascencion Dike PA-C   Electronically Signed   By: Jerilynn Mages.  Shick M.D.   On: 07/20/2014 11:35    Scheduled Meds: . cefTRIAXone (ROCEPHIN)  IV  2 g Intravenous Q24H  . folic acid  1 mg Oral Daily  . insulin aspart  0-9 Units Subcutaneous TID WC  . insulin glargine  10 Units Subcutaneous QHS  . lactulose  30 g Oral Daily  . LORazepam  0-4 mg Oral Q12H  . pantoprazole  40 mg Oral BID  . sertraline  150 mg Oral Daily  . spironolactone  12.5 mg Oral Daily  . thiamine  100 mg Oral Daily   Continuous Infusions:   Principal Problem:   Abdominal pain Active Problems:   MDD (major depressive disorder)   Thrombocytopenia   Diabetes mellitus type 2, controlled   Cirrhosis of liver  Elevated LFTs   Alcoholism    Time spent: 35 minutes.     Niel Hummer A  [facility] Pager [phone]. If 7PM-7AM, please contact night-coverage at [email or web address], password [facility] 07/22/2014, 10:41 AM  LOS: 2 days

## 2014-07-23 ENCOUNTER — Inpatient Hospital Stay (HOSPITAL_COMMUNITY): Payer: No Typology Code available for payment source

## 2014-07-23 LAB — CBC WITH DIFFERENTIAL/PLATELET
BASOS ABS: 0 10*3/uL (ref 0.0–0.1)
Basophils Relative: 0 % (ref 0–1)
EOS ABS: 0.1 10*3/uL (ref 0.0–0.7)
Eosinophils Relative: 3 % (ref 0–5)
HEMATOCRIT: 42 % (ref 36.0–46.0)
HEMOGLOBIN: 14.1 g/dL (ref 12.0–15.0)
LYMPHS ABS: 0.7 10*3/uL (ref 0.7–4.0)
Lymphocytes Relative: 26 % (ref 12–46)
MCH: 36.2 pg — ABNORMAL HIGH (ref 26.0–34.0)
MCHC: 33.6 g/dL (ref 30.0–36.0)
MCV: 108 fL — ABNORMAL HIGH (ref 78.0–100.0)
MONOS PCT: 20 % — AB (ref 3–12)
Monocytes Absolute: 0.5 10*3/uL (ref 0.1–1.0)
NEUTROS PCT: 52 % (ref 43–77)
Neutro Abs: 1.4 10*3/uL — ABNORMAL LOW (ref 1.7–7.7)
PLATELETS: 58 10*3/uL — AB (ref 150–400)
RBC: 3.89 MIL/uL (ref 3.87–5.11)
RDW: 14.1 % (ref 11.5–15.5)
WBC: 2.8 10*3/uL — AB (ref 4.0–10.5)

## 2014-07-23 LAB — COMPREHENSIVE METABOLIC PANEL
ALBUMIN: 2.9 g/dL — AB (ref 3.5–5.2)
ALT: 87 U/L — AB (ref 0–35)
AST: 118 U/L — AB (ref 0–37)
Alkaline Phosphatase: 153 U/L — ABNORMAL HIGH (ref 39–117)
Anion gap: 6 (ref 5–15)
BUN: 13 mg/dL (ref 6–23)
CALCIUM: 8.6 mg/dL (ref 8.4–10.5)
CO2: 31 mmol/L (ref 19–32)
Chloride: 98 mmol/L (ref 96–112)
Creatinine, Ser: 0.57 mg/dL (ref 0.50–1.10)
GFR calc Af Amer: >90 mL/min (ref >90)
Glucose, Bld: 171 mg/dL — ABNORMAL HIGH (ref 70–99)
Potassium: 3.8 mmol/L (ref 3.5–5.1)
Sodium: 135 mmol/L (ref 135–145)
Total Bilirubin: 2.2 mg/dL — ABNORMAL HIGH (ref 0.3–1.2)
Total Protein: 7.3 g/dL (ref 6.0–8.3)

## 2014-07-23 LAB — GLUCOSE, CAPILLARY
GLUCOSE-CAPILLARY: 333 mg/dL — AB (ref 70–99)
GLUCOSE-CAPILLARY: 60 mg/dL — AB (ref 70–99)
GLUCOSE-CAPILLARY: 81 mg/dL (ref 70–99)
Glucose-Capillary: 140 mg/dL — ABNORMAL HIGH (ref 70–99)
Glucose-Capillary: 157 mg/dL — ABNORMAL HIGH (ref 70–99)

## 2014-07-23 LAB — BODY FLUID CULTURE
Culture: NO GROWTH
GRAM STAIN: NONE SEEN

## 2014-07-23 MED ORDER — FLEET ENEMA 7-19 GM/118ML RE ENEM
1.0000 | ENEMA | Freq: Once | RECTAL | Status: AC
  Administered 2014-07-23: 1 via RECTAL
  Filled 2014-07-23: qty 1

## 2014-07-23 NOTE — Progress Notes (Signed)
[facility] PROGRESS NOTE  [name] QHU:[id number] DOB: 01-06-1966 DOA: 07/20/2014 PCP: Cathlean Cower, MD  Assessment/Plan: 1-Abdominal pain;  Covering for SBP. Continue with ceftriaxone.  S/P paracentesis: 3-30 yielding 180 cc. Follow culture.  Abdominal US; Cholelithiasis. Cirrhosis, ascites.  CT abdomen: 1. Hepatic cirrhosis with portal hypertension and splenomegaly. Splenomegaly is mildly progressed from prior exam.  Development of small to moderate volume of intra-abdominal ascites. Cholelithiasis. Gallbladder wall thickening is a common finding in chronic liver disease. Mild colonic wall thickening involving the cecum and ascending colon, may reflect portal enteropathy. Mildly elevated lipases.  HIDA scan negative.  Continue with  oxycodone.  Continue with ceftriaxone, covering for SBP, WBC increasing.  KUB ordered.   History of depression - presently no suicidal or suicidal ideation. Continue home medications. Psych consulted. Outpatient rehab for alcohol if patient agrees.   Cirrhosis of liver: Elevated LFTs most likely secondary to alcoholic hepatitis - follow LFTs closely. Trending down, suspect secondary to alcohol intake.  Started spironolactone.  LFT trending down.   Alcohol; on CIWA alcohol withdrawal.   Constipation; continue with  lactulose. KUB, enema PRN.   Diabetes mellitus type 2 decrease : uncontrolled.  Continue with lantus, 10 units, adjust as needed.   Chronic leukopenia and thrombocytopenia - probably secondary to cirrhosis and alcoholism. Follow CBC closely. Stable. WBC increasing.    Code Status: Full code.  Family Communication: care discussed with patient Disposition Plan: remain inpatient.    Consultants:  Psych  Procedures:  Paracentesis.   HIDA negative.   Antibiotics:  Ceftriaxone 3-30  HPI/Subjective: Feels a little better, still with abdominal pain. No BM yet, passing gas.   Objective: Filed Vitals:   07/23/14 0504   BP: 112/79  Pulse: 73  Temp: 98.1 F (36.7 C)  Resp: 18    Intake/Output Summary (Last 24 hours) at 07/23/14 1004 Last data filed at 07/23/14 0507  Gross per 24 hour  Intake    530 ml  Output      0 ml  Net    530 ml   Filed Weights   07/20/14 0230 07/22/14 1451  Weight: 55.3 kg (121 lb 14.6 oz) 55 kg (121 lb 4.1 oz)    Exam:   General:  Alert in no distress.   Cardiovascular: S 1, S 2 RRR  Respiratory: CTA  Abdomen: BS present, distended, tenderness.   Musculoskeletal: no edema.   Data Reviewed: Basic Metabolic Panel:  Recent Labs Lab 07/19/14 2222 07/20/14 0720 07/21/14 0534 07/22/14 0530 07/23/14 0527  NA 133* 136 137 135 135  K 3.6 3.5 3.7 3.8 3.8  CL 96 99 99 98 98  CO2 29 30 33* 30 31  GLUCOSE 168* 133* 102* 159* 171*  BUN 7 7 8 12 13   CREATININE 0.36* 0.36* 0.44* 0.54 0.57  CALCIUM 8.7 8.2* 8.5 8.6 8.6   Liver Function Tests:  Recent Labs Lab 07/19/14 2222 07/20/14 0720 07/21/14 0534 07/22/14 0530 07/23/14 0527  AST 172* 147* 170* 160* 118*  ALT 93* 85* 90* 94* 87*  ALKPHOS 143* 125* 140* 149* 153*  BILITOT 2.6* 2.4* 2.6* 2.3* 2.2*  PROT 7.4 6.7 6.8 6.9 7.3  ALBUMIN 2.9* 2.7* 2.8* 2.6* 2.9*    Recent Labs Lab 07/18/14 2134 07/19/14 2222  LIPASE 105* 53   No results for input(s): AMMONIA in the last 168 hours. CBC:  Recent Labs Lab 07/19/14 2222 07/20/14 0720 07/21/14 0534 07/22/14 0530 07/23/14 0527  WBC 2.4* 1.8* 1.8* 2.3* 2.8*  NEUTROABS 1.4*  0.8* 0.8* 1.1* 1.4*  HGB 13.8 13.5 13.8 13.8 14.1  HCT 40.1 40.2 41.4 41.4 42.0  MCV 105.8* 107.2* 108.9* 108.9* 108.0*  PLT 39* 36* 40* 54* 58*   Cardiac Enzymes: No results for input(s): CKTOTAL, CKMB, CKMBINDEX, TROPONINI in the last 168 hours. BNP (last 3 results) No results for input(s): BNP in the last 8760 hours.  ProBNP (last 3 results) No results for input(s): PROBNP in the last 8760 hours.  CBG:  Recent Labs Lab 07/22/14 0822 07/22/14 1145 07/22/14 1712  07/22/14 2147 07/23/14 0723  GLUCAP 148* 147* 165* 140* 140*    Recent Results (from the past 240 hour(s))  Body fluid culture     Status: None (Preliminary result)   Collection Time: 07/20/14 10:00 AM  Result Value Ref Range Status   Specimen Description ASCITIC  Final   Special Requests NONE  Final   Gram Stain   Final    NO WBC SEEN NO ORGANISMS SEEN Performed at [facility]    Culture   Final    NO GROWTH 2 DAYS Performed at [facility]    Report Status PENDING  Incomplete     Studies: No results found.  Scheduled Meds: . cefTRIAXone (ROCEPHIN)  IV  2 g Intravenous Q24H  . folic acid  1 mg Oral Daily  . insulin aspart  0-9 Units Subcutaneous TID WC  . insulin glargine  10 Units Subcutaneous QHS  . lactulose  30 g Oral Daily  . LORazepam  0-4 mg Oral Q12H  . pantoprazole  40 mg Oral BID  . sertraline  150 mg Oral Daily  . spironolactone  12.5 mg Oral Daily  . thiamine  100 mg Oral Daily   Continuous Infusions:   Principal Problem:   Abdominal pain Active Problems:   MDD (major depressive disorder)   Thrombocytopenia   Diabetes mellitus type 2, controlled   Cirrhosis of liver   Elevated LFTs   Alcoholism    Time spent: 35 minutes.     Niel Hummer A  [facility] Pager [phone]. If 7PM-7AM, please contact night-coverage at [email or web address], password [facility] 07/23/2014, 10:04 AM  LOS: 3 days

## 2014-07-24 DIAGNOSIS — R1084 Generalized abdominal pain: Secondary | ICD-10-CM

## 2014-07-24 DIAGNOSIS — B182 Chronic viral hepatitis C: Secondary | ICD-10-CM

## 2014-07-24 LAB — COMPREHENSIVE METABOLIC PANEL
ALT: 82 U/L — ABNORMAL HIGH (ref 0–35)
AST: 104 U/L — ABNORMAL HIGH (ref 0–37)
Albumin: 2.8 g/dL — ABNORMAL LOW (ref 3.5–5.2)
Alkaline Phosphatase: 152 U/L — ABNORMAL HIGH (ref 39–117)
Anion gap: 6 (ref 5–15)
BUN: 13 mg/dL (ref 6–23)
CALCIUM: 8.7 mg/dL (ref 8.4–10.5)
CHLORIDE: 98 mmol/L (ref 96–112)
CO2: 30 mmol/L (ref 19–32)
Creatinine, Ser: 0.58 mg/dL (ref 0.50–1.10)
Glucose, Bld: 212 mg/dL — ABNORMAL HIGH (ref 70–99)
POTASSIUM: 4.2 mmol/L (ref 3.5–5.1)
Sodium: 134 mmol/L — ABNORMAL LOW (ref 135–145)
Total Bilirubin: 2 mg/dL — ABNORMAL HIGH (ref 0.3–1.2)
Total Protein: 7.3 g/dL (ref 6.0–8.3)

## 2014-07-24 LAB — CBC WITH DIFFERENTIAL/PLATELET
Basophils Absolute: 0 10*3/uL (ref 0.0–0.1)
Basophils Relative: 0 % (ref 0–1)
EOS PCT: 2 % (ref 0–5)
Eosinophils Absolute: 0 10*3/uL (ref 0.0–0.7)
HEMATOCRIT: 43 % (ref 36.0–46.0)
HEMOGLOBIN: 14.4 g/dL (ref 12.0–15.0)
LYMPHS ABS: 0.9 10*3/uL (ref 0.7–4.0)
Lymphocytes Relative: 33 % (ref 12–46)
MCH: 36.4 pg — AB (ref 26.0–34.0)
MCHC: 33.5 g/dL (ref 30.0–36.0)
MCV: 108.6 fL — ABNORMAL HIGH (ref 78.0–100.0)
Monocytes Absolute: 0.5 10*3/uL (ref 0.1–1.0)
Monocytes Relative: 18 % — ABNORMAL HIGH (ref 3–12)
NEUTROS ABS: 1.3 10*3/uL — AB (ref 1.7–7.7)
Neutrophils Relative %: 48 % (ref 43–77)
Platelets: 51 10*3/uL — ABNORMAL LOW (ref 150–400)
RBC: 3.96 MIL/uL (ref 3.87–5.11)
RDW: 14 % (ref 11.5–15.5)
WBC: 2.7 10*3/uL — ABNORMAL LOW (ref 4.0–10.5)

## 2014-07-24 LAB — GLUCOSE, CAPILLARY
GLUCOSE-CAPILLARY: 197 mg/dL — AB (ref 70–99)
Glucose-Capillary: 152 mg/dL — ABNORMAL HIGH (ref 70–99)
Glucose-Capillary: 163 mg/dL — ABNORMAL HIGH (ref 70–99)
Glucose-Capillary: 272 mg/dL — ABNORMAL HIGH (ref 70–99)

## 2014-07-24 MED ORDER — LACTULOSE 10 GM/15ML PO SOLN
30.0000 g | Freq: Two times a day (BID) | ORAL | Status: DC
  Administered 2014-07-24 – 2014-07-25 (×3): 30 g via ORAL
  Filled 2014-07-24 (×6): qty 45

## 2014-07-24 MED ORDER — KETOROLAC TROMETHAMINE 30 MG/ML IJ SOLN
30.0000 mg | Freq: Four times a day (QID) | INTRAMUSCULAR | Status: DC
  Administered 2014-07-24 – 2014-07-26 (×9): 30 mg via INTRAVENOUS
  Filled 2014-07-24 (×12): qty 1

## 2014-07-24 MED ORDER — BISACODYL 10 MG RE SUPP
10.0000 mg | Freq: Once | RECTAL | Status: AC
Start: 2014-07-24 — End: 2014-07-24
  Administered 2014-07-24: 10 mg via RECTAL
  Filled 2014-07-24: qty 1

## 2014-07-24 MED ORDER — METHOCARBAMOL 500 MG PO TABS
500.0000 mg | ORAL_TABLET | Freq: Three times a day (TID) | ORAL | Status: DC
  Administered 2014-07-24 – 2014-07-26 (×7): 500 mg via ORAL
  Filled 2014-07-24 (×9): qty 1

## 2014-07-24 NOTE — Progress Notes (Signed)
[facility] PROGRESS NOTE  [name] [id number]:[id number] DOB: Nov 26, 1965 DOA: 07/20/2014 PCP: Cathlean Cower, MD  Assessment/Plan: 1-Abdominal pain;  Covering for SBP. Continue with ceftriaxone.  S/P paracentesis: 3-30 yielding 180 cc. Body fluid: no growth to date.  Abdominal US; Cholelithiasis. Cirrhosis, ascites.  CT abdomen: 1. Hepatic cirrhosis with portal hypertension and splenomegaly. Splenomegaly is mildly progressed from prior exam.  Development of small to moderate volume of intra-abdominal ascites. Cholelithiasis. Gallbladder wall thickening is a common finding in chronic liver disease. Mild colonic wall thickening involving the cecum and ascending colon, may reflect portal enteropathy. Mildly elevated lipases.  HIDA scan negative.  Continue with  Oxycodone PRN.  Continue with ceftriaxone, covering for SBP, WBC increasing.  KUB: No evidence of bowel obstruction or significant generalized adynamic ileus. No acute finding. Mild splenomegaly. Still with abdominal pain, will consult GI.   History of depression - presently no suicidal or suicidal ideation. Continue home medications. Psych consulted. Outpatient rehab for alcohol if patient agrees.   Cirrhosis of liver: Elevated LFTs most likely secondary to alcoholic hepatitis - follow LFTs closely. Trending down, suspect secondary to alcohol intake.  Started spironolactone.  LFT stable.   Alcohol; on CIWA alcohol withdrawal. Finished CIWA protocol.    Constipation; increase   lactulose. KUB negative for obstruction. Had small BM. Will order suppository.   Diabetes mellitus type 2 decrease : uncontrolled.  Continue with lantus, 10 units, adjust as needed.   Chronic leukopenia and thrombocytopenia - probably secondary to cirrhosis and alcoholism. Follow CBC closely. Stable. WBC increasing.    Code Status: Full code.  Family Communication: care discussed with patient Disposition Plan: remain inpatient.     Consultants:  Psych  Procedures:  Paracentesis.   HIDA negative.   Antibiotics:  Ceftriaxone 3-30  HPI/Subjective: Still complaining of abdominal pain 9/10, had a very small bowel movement yesterday.   Objective: Filed Vitals:   07/24/14 0554  BP: 120/75  Pulse: 63  Temp: 98 F (36.7 C)  Resp: 16    Intake/Output Summary (Last 24 hours) at 07/24/14 1007 Last data filed at 07/23/14 1500  Gross per 24 hour  Intake    240 ml  Output      0 ml  Net    240 ml   Filed Weights   07/20/14 0230 07/22/14 1451  Weight: 55.3 kg (121 lb 14.6 oz) 55 kg (121 lb 4.1 oz)    Exam:   General:  Alert in no distress.   Cardiovascular: S 1, S 2 RRR  Respiratory: CTA  Abdomen: BS present, distended, tenderness.   Musculoskeletal: no edema.   Data Reviewed: Basic Metabolic Panel:  Recent Labs Lab 07/20/14 0720 07/21/14 0534 07/22/14 0530 07/23/14 0527 07/24/14 0533  NA 136 137 135 135 134*  K 3.5 3.7 3.8 3.8 4.2  CL 99 99 98 98 98  CO2 30 33* 30 31 30   GLUCOSE 133* 102* 159* 171* 212*  BUN 7 8 12 13 13   CREATININE 0.36* 0.44* 0.54 0.57 0.58  CALCIUM 8.2* 8.5 8.6 8.6 8.7   Liver Function Tests:  Recent Labs Lab 07/20/14 0720 07/21/14 0534 07/22/14 0530 07/23/14 0527 07/24/14 0533  AST 147* 170* 160* 118* 104*  ALT 85* 90* 94* 87* 82*  ALKPHOS 125* 140* 149* 153* 152*  BILITOT 2.4* 2.6* 2.3* 2.2* 2.0*  PROT 6.7 6.8 6.9 7.3 7.3  ALBUMIN 2.7* 2.8* 2.6* 2.9* 2.8*    Recent Labs Lab 07/18/14 2134 07/19/14 2222  LIPASE 105* 53  No results for input(s): AMMONIA in the last 168 hours. CBC:  Recent Labs Lab 07/20/14 0720 07/21/14 0534 07/22/14 0530 07/23/14 0527 07/24/14 0533  WBC 1.8* 1.8* 2.3* 2.8* 2.7*  NEUTROABS 0.8* 0.8* 1.1* 1.4* 1.3*  HGB 13.5 13.8 13.8 14.1 14.4  HCT 40.2 41.4 41.4 42.0 43.0  MCV 107.2* 108.9* 108.9* 108.0* 108.6*  PLT 36* 40* 54* 58* 51*   Cardiac Enzymes: No results for input(s): CKTOTAL, CKMB, CKMBINDEX,  TROPONINI in the last 168 hours. BNP (last 3 results) No results for input(s): BNP in the last 8760 hours.  ProBNP (last 3 results) No results for input(s): PROBNP in the last 8760 hours.  CBG:  Recent Labs Lab 07/23/14 1142 07/23/14 1620 07/23/14 1953 07/23/14 2021 07/24/14 0724  GLUCAP 157* 333* 60* 81 152*    Recent Results (from the past 240 hour(s))  Body fluid culture     Status: None   Collection Time: 07/20/14 10:00 AM  Result Value Ref Range Status   Specimen Description ASCITIC  Final   Special Requests NONE  Final   Gram Stain   Final    NO WBC SEEN NO ORGANISMS SEEN Performed at [facility]    Culture   Final    NO GROWTH 3 DAYS Performed at [facility]    Report Status 07/23/2014 FINAL  Final     Studies: Dg Abd 1 View  07/23/2014   CLINICAL DATA:  Pt seems very lethargic, unable to give detailed history, relates she has been having continuing abd pain x 1 month s/p having her gallbladder removed. Pt c/o worsening pain x 3 days ago. Per EPIC chart, pt has elevate LFT's  EXAM: ABDOMEN - 1 VIEW  COMPARISON:  CT, 07/20/2014.  FINDINGS: Residual contrast is no alerted and colon. There is no colonic or small bowel dilation to suggest obstruction or significant generalized adynamic ileus.  Mild enlargement of the spleen as noted on the current CT. Soft tissues otherwise unremarkable.  No significant bony abnormality.  IMPRESSION: 1. No evidence of bowel obstruction or significant generalized adynamic ileus. No acute finding. 2. Mild splenomegaly.   Electronically Signed   By: Lajean Manes M.D.   On: 07/23/2014 10:51    Scheduled Meds: . bisacodyl  10 mg Rectal Once  . cefTRIAXone (ROCEPHIN)  IV  2 g Intravenous Q24H  . folic acid  1 mg Oral Daily  . insulin aspart  0-9 Units Subcutaneous TID WC  . insulin glargine  10 Units Subcutaneous QHS  . lactulose  30 g Oral BID  . pantoprazole  40 mg Oral BID  . sertraline  150 mg Oral Daily  .  spironolactone  12.5 mg Oral Daily  . thiamine  100 mg Oral Daily   Continuous Infusions:   Principal Problem:   MDD (major depressive disorder) Active Problems:   Thrombocytopenia   Abdominal pain   Diabetes mellitus type 2, controlled   Cirrhosis of liver   Elevated LFTs   Alcoholism    Time spent: 35 minutes.     Niel Hummer A  [facility] Pager [phone]. If 7PM-7AM, please contact night-coverage at [email or web address], password [facility] 07/24/2014, 10:07 AM  LOS: 4 days

## 2014-07-24 NOTE — Consult Note (Signed)
Consultation  Referring Provider:      Primary Care Physician:  Cathlean Cower, MD Primary Gastroenterologist:       Carlean Purl  Reason for Consultation:     Abdominal pain     Impression / Plan:   1) abdominal wall and lower thorax and rib pain from vomiting 2) alcoholic liver disease - cirrhosis and also HCV 3) chronic abdominal pain hx 4) probably has some alcoholic gastritis   1) Toradol IV, heating pad and Robaxin 2) Try to get her plugged in for HCV Tx as outpatient - hopefully stays off alcohol - she can f/u me in office 3) we will follow while here          HPI:   [name] is a 49 y.o. female admiitted from [facility] due to abdominal pain and nausea vomiting - she was detoxing from alcohol and being tx for depression. She has had persistent upper abdominal pain and a negative evaluation with CT, [place] and HIDA. Paracentesis negative for SBP. She is requiring oral narcotics for some relief. Vomiting and nause gone and she is eating. Some constipation. No fever. No dysphagia.   Past Medical History  Diagnosis Date  . Impaired glucose tolerance 11/07/2010  . Hepatitis C approx dx 2005    no tx to date  . Pancreatitis     HX of  . Alcohol dependency     at least [place] admissions 2007- 09/2013 for detox.   Marland Kitchen GERD (gastroesophageal reflux disease)   . Chronic constipation   . Gallstones   . Drug dependency     Hx of cocaine use  . Anxiety and depression 08/2011  . Rheumatoid factor positive   . Hemochromatosis     last phlebotomy tx ~ 2012. liver bx 2006  . Pancytopenia   . Abnormal vaginal bleeding     uterine fibroid  . Gallstone 2006  . Chronic abdominal pain 2011  . Cirrhosis 11/09/2010  . Type II or unspecified type diabetes mellitus without mention of complication, uncontrolled 09/17/2011  . Major depression 11/2012, 09/2013    [facility] admissions for this, suicide attempt by OD Ambien (2014), Prozac (2015),ETOHism   . Anxiety     Past Surgical History    Procedure Laterality Date  . Appendectomy    . Cesarean section  x 2  . Foot surgery  2013  . Abdominal hysterectomy  12/17/2011    Procedure: HYSTERECTOMY ABDOMINAL;  Surgeon: Gus Height, MD;  Location: [facility];  Service: Gynecology;  Laterality: N/A;  . Salpingoophorectomy  12/17/2011    Procedure: SALPINGO OOPHERECTOMY;  Surgeon: Gus Height, MD;  Location: [facility];  Service: Gynecology;  Laterality: Bilateral;  . Esophagogastroduodenoscopy N/A 12/10/2013    Procedure: ESOPHAGOGASTRODUODENOSCOPY (EGD);  Surgeon: Ladene Artist, MD;  Location: [facility];  Service: Endoscopy;  Laterality: N/A;  . Percutaneous liver biopsy  2011  . Colonoscopy N/A 02/18/2014    Procedure: COLONOSCOPY;  Surgeon: Gatha Mayer, MD;  Location: [facility];  Service: Endoscopy;  Laterality: N/A;    Family History  Problem Relation Age of Onset  . Cancer Mother     ovarian  . Diabetes Father      History  Substance Use Topics  . Smoking status: Never Smoker   . Smokeless tobacco: Never Used  . Alcohol Use: Yes     Comment: one pint vodka daily years    Prior to Admission medications   Medication Sig Start Date End  Date Taking? Authorizing Provider  diphenhydrAMINE (BENADRYL) 25 MG tablet Take 50 mg by mouth every 6 (six) hours as needed for sleep.    Historical Provider, MD  esomeprazole (NEXIUM) 40 MG capsule Take 40 mg by mouth daily as needed (heartburn).    Historical Provider, MD  ibuprofen (ADVIL,MOTRIN) 200 MG tablet Take 400 mg by mouth every 6 (six) hours as needed for moderate pain.    Historical Provider, MD  insulin aspart (NOVOLOG) 100 UNIT/ML injection Inject 20 Units into the skin 3 (three) times daily before meals.    Historical Provider, MD  insulin glargine (LANTUS) 100 UNIT/ML injection Inject 0.2 mLs (20 Units total) into the skin at bedtime. Patient taking differently: Inject 30 Units into the skin at bedtime.  07/14/14   Biagio Borg, MD  linagliptin (TRADJENTA) 5 MG TABS  tablet Take 1 tablet (5 mg total) by mouth daily. For diabetes 09/28/13   Encarnacion Slates, NP  metFORMIN (GLUCOPHAGE) 500 MG tablet TAKE 1 TABLET (500 MG TOTAL) BY MOUTH 2 (TWO) TIMES DAILY WITH A MEAL. 07/14/14   Biagio Borg, MD  Multiple Vitamin (MULTIVITAMIN WITH MINERALS) TABS tablet Take 1 tablet by mouth daily.     Historical Provider, MD  sertraline (ZOLOFT) 100 MG tablet Take 150 mg by mouth daily.    Historical Provider, MD  tetrahydrozoline (VISINE) 0.05 % ophthalmic solution Place 1 drop into both eyes every morning.    Historical Provider, MD  zolpidem (AMBIEN) 10 MG tablet Take 1 tablet (10 mg total) by mouth at bedtime as needed for sleep. 07/18/14 08/17/14  Biagio Borg, MD    Current Facility-Administered Medications  Medication Dose Route Frequency Provider Last Rate Last Dose  . cefTRIAXone (ROCEPHIN) 2 g in dextrose 5 % 50 mL IVPB - Premix  2 g Intravenous Q24H Dorrene German, RPH   2 g at 07/24/14 3846  . diphenhydrAMINE (BENADRYL) capsule 50 mg  50 mg Oral Q6H PRN Rise Patience, MD   50 mg at 07/24/14 0155  . folic acid (FOLVITE) tablet 1 mg  1 mg Oral Daily Rise Patience, MD   1 mg at 07/24/14 1100  . insulin aspart (novoLOG) injection 0-9 Units  0-9 Units Subcutaneous TID WC Rise Patience, MD   2 Units at 07/24/14 [phone]  . insulin glargine (LANTUS) injection 10 Units  10 Units Subcutaneous QHS Elmarie Shiley, MD   Stopped at 07/23/14 2128  . ketorolac (TORADOL) 30 MG/ML injection 30 mg  30 mg Intravenous 4 times per day Gatha Mayer, MD      . lactulose (CHRONULAC) 10 GM/15ML solution 30 g  30 g Oral BID Belkys A Regalado, MD      . methocarbamol (ROBAXIN) tablet 500 mg  500 mg Oral TID Gatha Mayer, MD      . ondansetron [facility]) tablet 4 mg  4 mg Oral Q6H PRN Rise Patience, MD       Or  . ondansetron [facility]) injection 4 mg  4 mg Intravenous Q6H PRN Rise Patience, MD      . oxyCODONE (Oxy IR/ROXICODONE) immediate release tablet 5-10 mg   5-10 mg Oral Q6H PRN Belkys A Regalado, MD   10 mg at 07/24/14 0823  . pantoprazole (PROTONIX) EC tablet 40 mg  40 mg Oral BID Belkys A Regalado, MD   40 mg at 07/24/14 1100  . sertraline (ZOLOFT) tablet 150 mg  150 mg Oral Daily Arshad N  Hal Hope, MD   150 mg at 07/24/14 1100  . spironolactone (ALDACTONE) tablet 12.5 mg  12.5 mg Oral Daily Belkys A Regalado, MD   12.5 mg at 07/24/14 1059  . thiamine (VITAMIN B-1) tablet 100 mg  100 mg Oral Daily Rise Patience, MD   100 mg at 07/24/14 1059  . zolpidem (AMBIEN) tablet 5 mg  5 mg Oral QHS PRN Rise Patience, MD        Allergies as of 07/20/2014 - Review Complete 07/20/2014  Allergen Reaction Noted  . Betadine [povidone iodine] Hives 12/24/2013     Review of Systems:    This is positive for those things mentioned in the HPI, also positive for weakness, back pain All other review of systems are negative.       Physical Exam:  Vital signs in last 24 hours: Temp:  [97.8 F (36.6 C)-98.5 F (36.9 C)] 98 F (36.7 C) (04/03 0554) Pulse Rate:  [63-76] 63 (04/03 0554) Resp:  [16] 16 (04/03 0554) BP: (93-120)/(60-75) 120/75 mmHg (04/03 0554) SpO2:  [98 %-99 %] 99 % (04/03 0554) Last BM Date: 07/23/14  General:  Asthenic, chronically ill and in no acute distress Eyes:  anicteric. ENT:   Mouth and posterior pharynx free of lesions.  Lungs: Clear to auscultation bilaterally. Heart:  S1S2, no rubs, murmurs, gallops.no edema Abdomen:  scaphoid and diffusely tender to light touch but benign. She has increassed tenderness with mm tension. + Carnett's sign. Lower ribs and flanks also tender. No masses, BS +  Lymph:  no cervical or supraclavicular adenopathy. Extremities:   no clubbing Skin   no rash. Neuro:  A&O x 3.  Psych:  appropriate mood and  Affect.   Data Reviewed:   LAB RESULTS:  Recent Labs  07/22/14 0530 07/23/14 0527 07/24/14 0533  WBC 2.3* 2.8* 2.7*  HGB 13.8 14.1 14.4  HCT 41.4 42.0 43.0  PLT 54* 58*  51*   BMET  Recent Labs  07/22/14 0530 07/23/14 0527 07/24/14 0533  NA 135 135 134*  K 3.8 3.8 4.2  CL 98 98 98  CO2 30 31 30   GLUCOSE 159* 171* 212*  BUN 12 13 13   CREATININE 0.54 0.57 0.58  CALCIUM 8.6 8.6 8.7   LFT  Recent Labs  07/24/14 0533  PROT 7.3  ALBUMIN 2.8*  AST 104*  ALT 82*  ALKPHOS 152*  BILITOT 2.0*      STUDIES: Dg Abd 1 View  07/23/2014   CLINICAL DATA:  Pt seems very lethargic, unable to give detailed history, relates she has been having continuing abd pain x 1 month s/p having her gallbladder removed. Pt c/o worsening pain x 3 days ago. Per EPIC chart, pt has elevate LFT's  EXAM: ABDOMEN - 1 VIEW  COMPARISON:  CT, 07/20/2014.  FINDINGS: Residual contrast is no alerted and colon. There is no colonic or small bowel dilation to suggest obstruction or significant generalized adynamic ileus.  Mild enlargement of the spleen as noted on the current CT. Soft tissues otherwise unremarkable.  No significant bony abnormality.  IMPRESSION: 1. No evidence of bowel obstruction or significant generalized adynamic ileus. No acute finding. 2. Mild splenomegaly.   Electronically Signed   By: Lajean Manes M.D.   On: 07/23/2014 10:51   07/11/2014 NL HIDA CT abd/pelvis 07/20/2014 IMPRESSION: 1. Hepatic cirrhosis with portal hypertension and splenomegaly. No focal enhancing hepatic lesion. Splenomegaly is mildly progressed from prior exam. 2. Development of small to moderate volume of intra-abdominal  ascites. 3. Cholelithiasis. Gallbladder wall thickening is a common finding in chronic liver disease. 4. Mild colonic wall thickening involving the cecum and ascending colon, may reflect portal enteropathy. PREVIOUS ENDOSCOPIES:            8/25 EGD - Mallory weiss tear, 3 supfl gastric ulcers and portal gastropathy vs gastritis 01/2014 colonoscopy - hemorrhoids/rectal varices - 2 diminutive polyps no path   Thanks   LOS: 4 days   @Carl  Simonne Maffucci, MD, [facility] @   07/24/2014, 11:07 AM

## 2014-07-25 DIAGNOSIS — K7030 Alcoholic cirrhosis of liver without ascites: Secondary | ICD-10-CM

## 2014-07-25 DIAGNOSIS — K5900 Constipation, unspecified: Secondary | ICD-10-CM

## 2014-07-25 DIAGNOSIS — R1010 Upper abdominal pain, unspecified: Secondary | ICD-10-CM

## 2014-07-25 DIAGNOSIS — K8020 Calculus of gallbladder without cholecystitis without obstruction: Secondary | ICD-10-CM | POA: Insufficient documentation

## 2014-07-25 LAB — COMPREHENSIVE METABOLIC PANEL
ALT: 88 U/L — ABNORMAL HIGH (ref 0–35)
AST: 122 U/L — AB (ref 0–37)
Albumin: 2.8 g/dL — ABNORMAL LOW (ref 3.5–5.2)
Alkaline Phosphatase: 143 U/L — ABNORMAL HIGH (ref 39–117)
Anion gap: 5 (ref 5–15)
BILIRUBIN TOTAL: 1.8 mg/dL — AB (ref 0.3–1.2)
BUN: 20 mg/dL (ref 6–23)
CHLORIDE: 98 mmol/L (ref 96–112)
CO2: 31 mmol/L (ref 19–32)
CREATININE: 0.7 mg/dL (ref 0.50–1.10)
Calcium: 8.8 mg/dL (ref 8.4–10.5)
GFR calc Af Amer: >90 mL/min (ref >90)
GFR calc non Af Amer: >90 mL/min (ref >90)
Glucose, Bld: 218 mg/dL — ABNORMAL HIGH (ref 70–99)
Potassium: 4.9 mmol/L (ref 3.5–5.1)
Sodium: 134 mmol/L — ABNORMAL LOW (ref 135–145)
TOTAL PROTEIN: 7.1 g/dL (ref 6.0–8.3)

## 2014-07-25 LAB — GLUCOSE, CAPILLARY
GLUCOSE-CAPILLARY: 221 mg/dL — AB (ref 70–99)
Glucose-Capillary: 171 mg/dL — ABNORMAL HIGH (ref 70–99)
Glucose-Capillary: 224 mg/dL — ABNORMAL HIGH (ref 70–99)
Glucose-Capillary: 119 mg/dL — ABNORMAL HIGH (ref 70–99)

## 2014-07-25 LAB — AMMONIA: Ammonia: 34 umol/L — ABNORMAL HIGH (ref 11–32)

## 2014-07-25 MED ORDER — FLEET ENEMA 7-19 GM/118ML RE ENEM
1.0000 | ENEMA | Freq: Once | RECTAL | Status: AC
  Administered 2014-07-25: 1 via RECTAL
  Filled 2014-07-25: qty 1

## 2014-07-25 NOTE — Consult Note (Signed)
Psychiatry Consult follow-up  Reason for Consult:  Depression, suicide ideation, abdominal pain and alcohol dependence Referring Physician:  Dr. Tyrell Antonio Patient Identification: [name] MRN:  [id number] Principal Diagnosis: MDD (major depressive disorder) alcohol withdrawal symptoms Diagnosis:   Patient Active Problem List   Diagnosis Date Noted  . Intractable vomiting [R11.10] 07/20/2014  . Abdominal pain [R10.9] 07/20/2014  . Diabetes mellitus type 2, controlled [E11.9] 07/20/2014  . Cirrhosis of liver [K74.60] 07/20/2014  . Elevated LFTs [R79.89] 07/20/2014  . Alcoholism [F10.20] 07/20/2014  . Alcohol dependence with unspecified alcohol-induced disorder [F10.29]   . Severe recurrent major depression without psychotic features [F33.2] 07/19/2014  . Wellness examination [Z01.89] 07/14/2014  . Colon polyps [K63.5] 02/18/2014  . 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 GIB (gastrointestinal bleeding) [K92.2] 02/14/2014  . Thrombocytopenia [D69.6] 02/14/2014  . Alcoholic hepatitis [Y86.57] 02/14/2014  . Abdominal pain, right upper quadrant [R10.11] 12/09/2013  . Acute alcohol intoxication [F10.129] 12/09/2013  . Hematemesis [K92.0] 12/09/2013  . Abdominal pain, generalized [R10.84] 12/09/2013  . Cholecystitis [K81.9] 12/08/2013  . Partial small bowel obstruction [K56.69] 12/08/2013  . MDD (major depressive disorder) [F32.2] 09/23/2013  . Alcohol dependence [F10.20] 12/17/2012  . Cocaine abuse [F14.10] 12/17/2012  . DM (diabetes mellitus), type 2 [E11.9] 09/17/2011  . Cirrhosis [K74.60] 11/09/2010  . Acute hepatitis C virus infection [B17.10] 05/29/2007    Total Time spent with patient: 20 minutes  Subjective:   [name] is a 49 y.o. female patient admitted with Depression, suicide ideation, abdominal pain and alcohol dependence.  HPI:  [name] is a 49 y.o. female seen, chart reviewed for psychiatric consultation and evaluation of depression, suicidal  ideation, alcohol intoxication and abdominal pain. Patient was initially evaluated at behavioral health Hospital and then referred to the [facility] long emergency department for medical clearance. Patient was admitted to [facility] medical floor secondary to cirrhosis of liver and abdominal pain probably secondary to alcohol intoxication/dependence. Patient denies current symptoms of alcohol withdrawal and stated she has anxiety and hand shakes yesterday. Patient stated she wanted to quit drinking forever but not interested to go to residential substance abuse rehabilitation treatment. Patient stated she has a grandchild on the way.  patient has a history of substance abuse rehabilitation at [facility] about 4 years ago then sober about a year and again at [facility] about 2 years ago and stayed sober for 6 months. Patient reported she has been drinking 2-3 times a week instead of daily. Patient also reported her husband has been abusing when he gets trunk and he has been drinking every day. Reportedly more arguments and verbal abuse but no known physical abuse. Patient denies current symptoms of suicidal ideation, homicidal ideation, intention or plans. Patient has no evidence of psychosis. Patient wants to go home and she was medically stable. Patient was informed that she does not required alcohol detox treatment after hospitalization and referred for the rehabilitation. She was also elevated that social service can meet with her and provided a available options if she changed her mind.  Interval history: Patient seen for psychiatric consultation and follow-up for depression and alcohol withdrawal symptoms. Patient has been stable on her current medication management without significant symptoms of depression, suicidal ideation and alcohol withdrawal. Patient continued to have abdominal pain but with less degree. Patient continued to have mild symptoms of depressed, decreased psychomotor activity.  Reportedly patient has fair appetite and sleep. Patient received multiple referral for the alcohol rehabilitation services from the  social service. Patient reported she has made her mind not to participate in rehabilitation treatment and wanted to go home. Patient is making poor choices based on long history of alcohol dependence and home environment with husband with the active drinking problem. Patient denies current suicidal, homicidal ideation, intention or plan. Patient was encouraged to participate in rehabilitation treatment. Patient cannot be placed in rehabilitation involuntarily.  Patient is on probation for substance abuse and required being* breath analyzer in her car. Reportedly she spent 2 weeks of present time recently. Patient husband drinks more than herself and patient secured car ignition key so that her husband does not get into drunk and drive again. Patient reports she has 3 DUIs and her husband has 5 DUIs.   Past Medical History:  Past Medical History  Diagnosis Date  . Impaired glucose tolerance 11/07/2010  . Hepatitis C approx dx 2005    no tx to date  . Pancreatitis     HX of  . Alcohol dependency     at least [place] admissions 2007- 09/2013 for detox.   Marland Kitchen GERD (gastroesophageal reflux disease)   . Chronic constipation   . Gallstones   . Drug dependency     Hx of cocaine use  . Anxiety and depression 08/2011  . Rheumatoid factor positive   . Hemochromatosis     last phlebotomy tx ~ 2012. liver bx 2006  . Pancytopenia   . Abnormal vaginal bleeding     uterine fibroid  . Gallstone 2006  . Chronic abdominal pain 2011  . Cirrhosis 11/09/2010  . Type II or unspecified type diabetes mellitus without mention of complication, uncontrolled 09/17/2011  . Major depression 11/2012, 09/2013    [facility] admissions for this, suicide attempt by OD Ambien (2014), Prozac (2015),ETOHism   . Anxiety     Past Surgical History  Procedure Laterality Date  . Appendectomy    . Cesarean  section  x 2  . Foot surgery  2013  . Abdominal hysterectomy  12/17/2011    Procedure: HYSTERECTOMY ABDOMINAL;  Surgeon: Gus Height, MD;  Location: [facility];  Service: Gynecology;  Laterality: N/A;  . Salpingoophorectomy  12/17/2011    Procedure: SALPINGO OOPHERECTOMY;  Surgeon: Gus Height, MD;  Location: [facility];  Service: Gynecology;  Laterality: Bilateral;  . Esophagogastroduodenoscopy N/A 12/10/2013    Procedure: ESOPHAGOGASTRODUODENOSCOPY (EGD);  Surgeon: Ladene Artist, MD;  Location: [facility];  Service: Endoscopy;  Laterality: N/A;  . Percutaneous liver biopsy  2011  . Colonoscopy N/A 02/18/2014    Procedure: COLONOSCOPY;  Surgeon: Gatha Mayer, MD;  Location: [place];  Service: Endoscopy;  Laterality: N/A;   Family History:  Family History  Problem Relation Age of Onset  . Cancer Mother     ovarian  . Diabetes Father    Social History:  History  Alcohol Use  . Yes    Comment: one pint vodka daily years     History  Drug Use No    Comment: denied all drugs during admission    History   Social History  . Marital Status: Married    Spouse Name: N/A  . Number of Children: 3  . Years of Education: N/A   Occupational History  . formed [employer]     now staying home   Social History Main Topics  . Smoking status: Never Smoker   . Smokeless tobacco: Never Used  . Alcohol Use: Yes     Comment: one pint vodka daily years  .  Drug Use: No     Comment: denied all drugs during admission  . Sexual Activity: Not Currently    Birth Control/ Protection: None   Other Topics Concern  . None   Social History Narrative   Additional Social History: Patient lives with her husband and has a history of drinking alcohol mostly together.      Allergies:   Allergies  Allergen Reactions  . Betadine [Povidone Iodine] Hives    Labs:  Results for orders placed or performed during the hospital encounter of 07/20/14 (from the past 48 hour(s))  Glucose, capillary      Status: Abnormal   Collection Time: 07/23/14  4:20 PM  Result Value Ref Range   Glucose-Capillary 333 (H) 70 - 99 mg/dL   Comment 1 Notify RN   Glucose, capillary     Status: Abnormal   Collection Time: 07/23/14  7:53 PM  Result Value Ref Range   Glucose-Capillary 60 (L) 70 - 99 mg/dL   Comment 1 Notify RN   Glucose, capillary     Status: None   Collection Time: 07/23/14  8:21 PM  Result Value Ref Range   Glucose-Capillary 81 70 - 99 mg/dL   Comment 1 Notify RN   CBC with Differential/Platelet     Status: Abnormal   Collection Time: 07/24/14  5:33 AM  Result Value Ref Range   WBC 2.7 (L) 4.0 - 10.5 K/uL   RBC 3.96 3.87 - 5.11 MIL/uL   Hemoglobin 14.4 12.0 - 15.0 g/dL   HCT 43.0 36.0 - 46.0 %   MCV 108.6 (H) 78.0 - 100.0 fL   MCH 36.4 (H) 26.0 - 34.0 pg   MCHC 33.5 30.0 - 36.0 g/dL   RDW 14.0 11.5 - 15.5 %   Platelets 51 (L) 150 - 400 K/uL    Comment: CONSISTENT WITH PREVIOUS RESULT   Neutrophils Relative % 48 43 - 77 %   Neutro Abs 1.3 (L) 1.7 - 7.7 K/uL   Lymphocytes Relative 33 12 - 46 %   Lymphs Abs 0.9 0.7 - 4.0 K/uL   Monocytes Relative 18 (H) 3 - 12 %   Monocytes Absolute 0.5 0.1 - 1.0 K/uL   Eosinophils Relative 2 0 - 5 %   Eosinophils Absolute 0.0 0.0 - 0.7 K/uL   Basophils Relative 0 0 - 1 %   Basophils Absolute 0.0 0.0 - 0.1 K/uL  Comprehensive metabolic panel     Status: Abnormal   Collection Time: 07/24/14  5:33 AM  Result Value Ref Range   Sodium 134 (L) 135 - 145 mmol/L   Potassium 4.2 3.5 - 5.1 mmol/L   Chloride 98 96 - 112 mmol/L   CO2 30 19 - 32 mmol/L   Glucose, Bld 212 (H) 70 - 99 mg/dL   BUN 13 6 - 23 mg/dL   Creatinine, Ser 0.58 0.50 - 1.10 mg/dL   Calcium 8.7 8.4 - 10.5 mg/dL   Total Protein 7.3 6.0 - 8.3 g/dL   Albumin 2.8 (L) 3.5 - 5.2 g/dL   AST 104 (H) 0 - 37 U/L   ALT 82 (H) 0 - 35 U/L   Alkaline Phosphatase 152 (H) 39 - 117 U/L   Total Bilirubin 2.0 (H) 0.3 - 1.2 mg/dL   GFR calc non Af Amer >90 >90 mL/min   GFR calc Af Amer >90 >90  mL/min    Comment: (NOTE) The eGFR has been calculated using the CKD EPI equation. This calculation has not been validated  in all clinical situations. eGFR's persistently <90 mL/min signify possible Chronic Kidney Disease.    Anion gap 6 5 - 15  Glucose, capillary     Status: Abnormal   Collection Time: 07/24/14  7:24 AM  Result Value Ref Range   Glucose-Capillary 152 (H) 70 - 99 mg/dL   Comment 1 Notify RN   Glucose, capillary     Status: Abnormal   Collection Time: 07/24/14 11:32 AM  Result Value Ref Range   Glucose-Capillary 163 (H) 70 - 99 mg/dL   Comment 1 Notify RN   Glucose, capillary     Status: Abnormal   Collection Time: 07/24/14  4:14 PM  Result Value Ref Range   Glucose-Capillary 197 (H) 70 - 99 mg/dL   Comment 1 Notify RN   Glucose, capillary     Status: Abnormal   Collection Time: 07/24/14  9:06 PM  Result Value Ref Range   Glucose-Capillary 272 (H) 70 - 99 mg/dL   Comment 1 Notify RN   Comprehensive metabolic panel     Status: Abnormal   Collection Time: 07/25/14  5:49 AM  Result Value Ref Range   Sodium 134 (L) 135 - 145 mmol/L   Potassium 4.9 3.5 - 5.1 mmol/L   Chloride 98 96 - 112 mmol/L   CO2 31 19 - 32 mmol/L   Glucose, Bld 218 (H) 70 - 99 mg/dL   BUN 20 6 - 23 mg/dL   Creatinine, Ser 0.70 0.50 - 1.10 mg/dL   Calcium 8.8 8.4 - 10.5 mg/dL   Total Protein 7.1 6.0 - 8.3 g/dL   Albumin 2.8 (L) 3.5 - 5.2 g/dL   AST 122 (H) 0 - 37 U/L   ALT 88 (H) 0 - 35 U/L   Alkaline Phosphatase 143 (H) 39 - 117 U/L   Total Bilirubin 1.8 (H) 0.3 - 1.2 mg/dL   GFR calc non Af Amer >90 >90 mL/min   GFR calc Af Amer >90 >90 mL/min    Comment: (NOTE) The eGFR has been calculated using the CKD EPI equation. This calculation has not been validated in all clinical situations. eGFR's persistently <90 mL/min signify possible Chronic Kidney Disease.    Anion gap 5 5 - 15  Glucose, capillary     Status: Abnormal   Collection Time: 07/25/14  8:01 AM  Result Value Ref Range    Glucose-Capillary 221 (H) 70 - 99 mg/dL  Glucose, capillary     Status: Abnormal   Collection Time: 07/25/14 12:01 PM  Result Value Ref Range   Glucose-Capillary 171 (H) 70 - 99 mg/dL    Vitals: Blood pressure 117/71, pulse 60, temperature 97.3 F (36.3 C), temperature source Oral, resp. rate 18, height '5\' 2"'  (1.575 m), weight 55 kg (121 lb 4.1 oz), last menstrual period 11/01/2011, SpO2 99 %.  Risk to Self: Is patient at risk for suicide?: No Risk to Others:   Prior Inpatient Therapy:   Prior Outpatient Therapy:    Current Facility-Administered Medications  Medication Dose Route Frequency Provider Last Rate Last Dose  . cefTRIAXone (ROCEPHIN) 2 g in dextrose 5 % 50 mL IVPB - Premix  2 g Intravenous Q24H Dorrene German, RPH   2 g at 07/25/14 0433  . diphenhydrAMINE (BENADRYL) capsule 50 mg  50 mg Oral Q6H PRN Rise Patience, MD   50 mg at 07/24/14 0155  . folic acid (FOLVITE) tablet 1 mg  1 mg Oral Daily Rise Patience, MD   1 mg at  07/25/14 1052  . insulin aspart (novoLOG) injection 0-9 Units  0-9 Units Subcutaneous TID WC Rise Patience, MD   3 Units at 07/25/14 [phone]  . insulin glargine (LANTUS) injection 10 Units  10 Units Subcutaneous QHS Belkys A Regalado, MD   10 Units at 07/24/14 2136  . ketorolac (TORADOL) 30 MG/ML injection 30 mg  30 mg Intravenous 4 times per day Gatha Mayer, MD   30 mg at 07/25/14 0505  . lactulose (CHRONULAC) 10 GM/15ML solution 30 g  30 g Oral BID Belkys A Regalado, MD   30 g at 07/25/14 1051  . methocarbamol (ROBAXIN) tablet 500 mg  500 mg Oral TID Gatha Mayer, MD   500 mg at 07/25/14 1051  . ondansetron (ZOFRAN) tablet 4 mg  4 mg Oral Q6H PRN Rise Patience, MD       Or  . ondansetron [facility]) injection 4 mg  4 mg Intravenous Q6H PRN Rise Patience, MD      . oxyCODONE (Oxy IR/ROXICODONE) immediate release tablet 5-10 mg  5-10 mg Oral Q6H PRN Belkys A Regalado, MD   10 mg at 07/25/14 1052  . pantoprazole (PROTONIX) EC  tablet 40 mg  40 mg Oral BID Belkys A Regalado, MD   40 mg at 07/25/14 1051  . sertraline (ZOLOFT) tablet 150 mg  150 mg Oral Daily Rise Patience, MD   150 mg at 07/25/14 1051  . sodium phosphate (FLEET) 7-19 GM/118ML enema 1 enema  1 enema Rectal Once Belkys A Regalado, MD      . spironolactone (ALDACTONE) tablet 12.5 mg  12.5 mg Oral Daily Belkys A Regalado, MD   12.5 mg at 07/25/14 1051  . thiamine (VITAMIN B-1) tablet 100 mg  100 mg Oral Daily Rise Patience, MD   100 mg at 07/25/14 1052  . zolpidem (AMBIEN) tablet 5 mg  5 mg Oral QHS PRN Rise Patience, MD        Musculoskeletal: Strength & Muscle Tone: decreased Gait & Station: unable to stand Patient leans: N/A  Psychiatric Specialty Exam: Physical ExamAs per history and physical   ROSFeeling tired, anxious, depressed and abdominal pain   Blood pressure 117/71, pulse 60, temperature 97.3 F (36.3 C), temperature source Oral, resp. rate 18, height '5\' 2"'  (1.575 m), weight 55 kg (121 lb 4.1 oz), last menstrual period 11/01/2011, SpO2 99 %.Body mass index is 22.17 kg/(m^2).  General Appearance: Guarded  Eye Contact::  Good  Speech:  Clear and Coherent and Slow  Volume:  Decreased  Mood:  Depressed  Affect:  Congruent and Depressed  Thought Process:  Coherent and Goal Directed  Orientation:  Full (Time, Place, and Person)  Thought Content:  Rumination  Suicidal Thoughts:  No  Homicidal Thoughts:  No  Memory:  Immediate;   Fair Recent;   Fair  Judgement:  Impaired  Insight:  Lacking  Psychomotor Activity:  Decreased  Concentration:  Fair  Recall:  [employer] of Knowledge:Good  Language: Good  Akathisia:  Negative  Handed:  Right  AIMS (if indicated):     Assets:  Communication Skills Desire for Improvement Housing Leisure Time Resilience Social Support  ADL's:  Impaired  Cognition: WNL  Sleep:      Medical Decision Making: New problem, with additional work up planned, Review of Psycho-Social  Stressors (1), Review or order clinical lab tests (1), Decision to obtain old records (1), Established Problem, Worsening (2), Review of Last Therapy Session (1),  Review or order medicine tests (1), Review of Medication Regimen & Side Effects (2) and Review of New Medication or Change in Dosage (2)  Treatment Plan Summary: Daily contact with patient to assess and evaluate symptoms and progress in treatment and Medication management  Plan: Continue Ativan alcohol detox treatment and ciwa protocol Continue Zoloft 150 mg daily for depression  Patient does not meet criteria for psychiatric inpatient admission. Supportive therapy provided about ongoing stressors. Refer to IOP.  Psychiatric social service will provide appropriate reference for substance abuse rehabilitation treatment  Disposition: Refer to the substance abuse rehabilitation treatment when medically stable. Patient was encouraged to participate in all cardiac rehabilitation program which helped her in the past.   Rozann Holts,JANARDHAHA R. 07/25/2014 12:46 PM

## 2014-07-25 NOTE — Progress Notes (Signed)
[facility] Progress Note  Subjective:  Still complaining of abdominal pain, mostly with movement.  No nausea/vomiting in 5 days; she is eating well.  Objective:  Vital signs in last 24 hours: Temp:  [97.3 F (36.3 C)-98.3 F (36.8 C)] 97.3 F (36.3 C) (04/04 0539) Pulse Rate:  [60-69] 60 (04/04 0539) Resp:  [18] 18 (04/04 0539) BP: (98-117)/(62-71) 117/71 mmHg (04/04 0539) SpO2:  [99 %-100 %] 99 % (04/04 0539) Last BM Date: 07/23/14 General:  Chronically ill-appearing, in NAD Heart:  Regular rate and rhythm; no murmurs Pulm:  CTAB.  No W/R/R. Abdomen:  Soft, non-distended. Normal bowel sounds.  Diffuse TTP particularly in the upper abdomen. Extremities:  Without edema. Neurologic:  Alert and  oriented x4;  grossly normal neurologically.  Intake/Output from previous day: 04/03 0701 - 04/04 0700 In: 720 [P.O.:720] Out: -  Intake/Output this shift: Total I/O In: 240 [P.O.:240] Out: -   Lab Results:  Recent Labs  07/23/14 0527 07/24/14 0533  WBC 2.8* 2.7*  HGB 14.1 14.4  HCT 42.0 43.0  PLT 58* 51*   BMET  Recent Labs  07/23/14 0527 07/24/14 0533 07/25/14 0549  NA 135 134* 134*  K 3.8 4.2 4.9  CL 98 98 98  CO2 31 30 31   GLUCOSE 171* 212* 218*  BUN 13 13 20   CREATININE 0.57 0.58 0.70  CALCIUM 8.6 8.7 8.8   LFT  Recent Labs  07/25/14 0549  PROT 7.1  ALBUMIN 2.8*  AST 122*  ALT 88*  ALKPHOS 143*  BILITOT 1.8*   Dg Abd 1 View  07/23/2014   CLINICAL DATA:  Pt seems very lethargic, unable to give detailed history, relates she has been having continuing abd pain x 1 month s/p having her gallbladder removed. Pt c/o worsening pain x 3 days ago. Per EPIC chart, pt has elevate LFT's  EXAM: ABDOMEN - 1 VIEW  COMPARISON:  CT, 07/20/2014.  FINDINGS: Residual contrast is no alerted and colon. There is no colonic or small bowel dilation to suggest obstruction or significant generalized adynamic ileus.  Mild enlargement of the spleen as noted on  the current CT. Soft tissues otherwise unremarkable.  No significant bony abnormality.  IMPRESSION: 1. No evidence of bowel obstruction or significant generalized adynamic ileus. No acute finding. 2. Mild splenomegaly.   Electronically Signed   By: Lajean Manes M.D.   On: 07/23/2014 10:51    Assessment / Plan: 1) Suspected abdominal wall and lower thorax and rib pain from vomiting:  She's had extensive GI evaluation. 2) Alcoholic liver disease - cirrhosis and also HCV 3) Chronic abdominal pain hx 4) Probably has some alcoholic gastritis.   EGD 11/2013 showed Mallory-Weiss tear at the gastroesophageal junction; three linear, medium sized, non-bleeding ulcers in the gastric body; and diffuse gastritis vs gastropathy  -Toradol IV, heating pad and Robaxin -Try to get her plugged in for HCV Tx as outpatient - hopefully stays off alcohol - she can f/u with Dr. Carlean Purl in office per his note -Pantoprazole 40 mg BID. -Likely can discontinue Rocephin as her studies were negative for SBP.    LOS: 5 days   ZEHR, JESSICA D.  07/25/2014, 10:29 AM  Pager number [phone]  Attending MD note:   I have taken a history, examined the patient, and reviewed the chart. I agree with the Advanced Practitioner's impression and recommendations. She is tolerating food. OK to D/C from GI standpoint. Keep on PPI bid.  [facility] Pager #  [phone]     

## 2014-07-25 NOTE — Progress Notes (Signed)
[facility] PROGRESS NOTE  [name] DTO:[id number] DOB: 09-Feb-1966 DOA: 07/20/2014 PCP: Cathlean Cower, MD  Assessment/Plan: 1-Abdominal pain;  Received 5 days of  ceftriaxone.  S/P paracentesis: 3-30 yielding 180 cc. Body fluid: no growth to date.  Abdominal US; Cholelithiasis. Cirrhosis, ascites.  CT abdomen: 1. Hepatic cirrhosis with portal hypertension and splenomegaly. Splenomegaly is mildly progressed from prior exam.  Development of small to moderate volume of intra-abdominal ascites. Cholelithiasis. Gallbladder wall thickening is a common finding in chronic liver disease. Mild colonic wall thickening involving the cecum and ascending colon, may reflect portal enteropathy. Mildly elevated lipases.  HIDA scan negative.  Continue with  Oxycodone PRN.  Continue with ceftriaxone, covering for SBP, WBC increasing.  KUB: No evidence of bowel obstruction or significant generalized adynamic ileus. No acute finding. Mild splenomegaly. Appreciate Dr Carlean Purl evaluation.   History of depression - presently no suicidal or suicidal ideation. Continue home medications. Psych consulted. Outpatient rehab for alcohol if patient agrees.   Cirrhosis of liver: Elevated LFTs most likely secondary to alcoholic hepatitis - follow LFTs closely. Trending down, suspect secondary to alcohol intake.  Started spironolactone.  LFT stable.   Alcohol; on CIWA alcohol withdrawal. Finished CIWA protocol.    Constipation; increase   lactulose. KUB negative for obstruction. Had small BM. Will order another suppository.   Diabetes mellitus type 2 decrease : uncontrolled.  Continue with lantus, 10 units, adjust as needed.   Chronic leukopenia and thrombocytopenia - probably secondary to cirrhosis and alcoholism. Follow CBC closely. Stable. WBC increasing.    Code Status: Full code.  Family Communication: care discussed with patient Disposition Plan: remain inpatient.     Consultants:  Psych  Procedures:  Paracentesis.   HIDA negative.   Antibiotics:  Ceftriaxone 3-30  HPI/Subjective: Pain is not worse. Feels oxycodone help .  No BM yet.   Objective: Filed Vitals:   07/25/14 0539  BP: 117/71  Pulse: 60  Temp: 97.3 F (36.3 C)  Resp: 18    Intake/Output Summary (Last 24 hours) at 07/25/14 1302 Last data filed at 07/25/14 0903  Gross per 24 hour  Intake    720 ml  Output      0 ml  Net    720 ml   Filed Weights   07/20/14 0230 07/22/14 1451  Weight: 55.3 kg (121 lb 14.6 oz) 55 kg (121 lb 4.1 oz)    Exam:   General:  Alert in no distress.   Cardiovascular: S 1, S 2 RRR  Respiratory: CTA  Abdomen: BS present, distended, tenderness.   Musculoskeletal: no edema.   Data Reviewed: Basic Metabolic Panel:  Recent Labs Lab 07/21/14 0534 07/22/14 0530 07/23/14 0527 07/24/14 0533 07/25/14 0549  NA 137 135 135 134* 134*  K 3.7 3.8 3.8 4.2 4.9  CL 99 98 98 98 98  CO2 33* 30 31 30 31   GLUCOSE 102* 159* 171* 212* 218*  BUN 8 12 13 13 20   CREATININE 0.44* 0.54 0.57 0.58 0.70  CALCIUM 8.5 8.6 8.6 8.7 8.8   Liver Function Tests:  Recent Labs Lab 07/21/14 0534 07/22/14 0530 07/23/14 0527 07/24/14 0533 07/25/14 0549  AST 170* 160* 118* 104* 122*  ALT 90* 94* 87* 82* 88*  ALKPHOS 140* 149* 153* 152* 143*  BILITOT 2.6* 2.3* 2.2* 2.0* 1.8*  PROT 6.8 6.9 7.3 7.3 7.1  ALBUMIN 2.8* 2.6* 2.9* 2.8* 2.8*    Recent Labs Lab 07/18/14 2134 07/19/14 2222  LIPASE 105* 53   No results  for input(s): AMMONIA in the last 168 hours. CBC:  Recent Labs Lab 07/20/14 0720 07/21/14 0534 07/22/14 0530 07/23/14 0527 07/24/14 0533  WBC 1.8* 1.8* 2.3* 2.8* 2.7*  NEUTROABS 0.8* 0.8* 1.1* 1.4* 1.3*  HGB 13.5 13.8 13.8 14.1 14.4  HCT 40.2 41.4 41.4 42.0 43.0  MCV 107.2* 108.9* 108.9* 108.0* 108.6*  PLT 36* 40* 54* 58* 51*   Cardiac Enzymes: No results for input(s): CKTOTAL, CKMB, CKMBINDEX, TROPONINI in the last 168  hours. BNP (last 3 results) No results for input(s): BNP in the last 8760 hours.  ProBNP (last 3 results) No results for input(s): PROBNP in the last 8760 hours.  CBG:  Recent Labs Lab 07/24/14 1132 07/24/14 1614 07/24/14 2106 07/25/14 0801 07/25/14 1201  GLUCAP 163* 197* 272* 221* 171*    Recent Results (from the past 240 hour(s))  Body fluid culture     Status: None   Collection Time: 07/20/14 10:00 AM  Result Value Ref Range Status   Specimen Description ASCITIC  Final   Special Requests NONE  Final   Gram Stain   Final    NO WBC SEEN NO ORGANISMS SEEN Performed at [facility]    Culture   Final    NO GROWTH 3 DAYS Performed at [facility]    Report Status 07/23/2014 FINAL  Final     Studies: No results found.  Scheduled Meds: . cefTRIAXone (ROCEPHIN)  IV  2 g Intravenous Q24H  . folic acid  1 mg Oral Daily  . insulin aspart  0-9 Units Subcutaneous TID WC  . insulin glargine  10 Units Subcutaneous QHS  . ketorolac  30 mg Intravenous 4 times per day  . lactulose  30 g Oral BID  . methocarbamol  500 mg Oral TID  . pantoprazole  40 mg Oral BID  . sertraline  150 mg Oral Daily  . spironolactone  12.5 mg Oral Daily  . thiamine  100 mg Oral Daily   Continuous Infusions:   Principal Problem:   MDD (major depressive disorder) Active Problems:   Thrombocytopenia   Abdominal pain   Diabetes mellitus type 2, controlled   Cirrhosis of liver   Elevated LFTs   Alcoholism    Time spent: 35 minutes.     Niel Hummer A  [facility] Pager [phone]. If 7PM-7AM, please contact night-coverage at [email or web address], password [facility] 07/25/2014, 1:02 PM  LOS: 5 days

## 2014-07-26 LAB — GLUCOSE, CAPILLARY
Glucose-Capillary: 183 mg/dL — ABNORMAL HIGH (ref 70–99)
Glucose-Capillary: 291 mg/dL — ABNORMAL HIGH (ref 70–99)

## 2014-07-26 MED ORDER — OXYCODONE HCL 5 MG PO TABS: 5.0000 mg | ORAL_TABLET | Freq: Four times a day (QID) | ORAL | Status: DC | PRN

## 2014-07-26 MED ORDER — INSULIN ASPART 100 UNIT/ML ~~LOC~~ SOLN: 3.0000 [IU] | Freq: Three times a day (TID) | SUBCUTANEOUS | Status: DC

## 2014-07-26 MED ORDER — SPIRONOLACTONE 25 MG PO TABS: 12.5000 mg | ORAL_TABLET | Freq: Every day | ORAL | Status: DC

## 2014-07-26 MED ORDER — INSULIN GLARGINE 100 UNIT/ML ~~LOC~~ SOLN: 15.0000 [IU] | Freq: Every day | SUBCUTANEOUS | Status: DC

## 2014-07-26 MED ORDER — LACTULOSE 10 GM/15ML PO SOLN: 30.0000 g | Freq: Two times a day (BID) | ORAL | Status: DC

## 2014-07-26 MED ORDER — METHOCARBAMOL 500 MG PO TABS: 500.0000 mg | ORAL_TABLET | Freq: Three times a day (TID) | ORAL | Status: DC

## 2014-07-26 NOTE — Discharge Summary (Signed)
Physician Discharge Summary  [name]:[id number] DOB: [birth year] DOA: 07/20/2014  PCP: Cathlean Cower, MD  Admit date: 07/20/2014 Discharge date: 07/26/2014  Time spent: 35 minutes  Recommendations for Outpatient Follow-up:  Follow up with Dr Carlean Purl, needs referral for hepatitis C clinic.  Needs repeat CBC, and C-met.  Encourage alcohol cessation.    Discharge Diagnoses:    Thrombocytopenia   Acute on chronic Abdominal pain   Diabetes mellitus type 2, controlled   Cirrhosis of liver   MDD (major depressive disorder)   Elevated LFTs   Alcoholism   Constipation   Discharge Condition: Stable.   Diet recommendation: Heart Healthy  Filed Weights   07/20/14 0230 07/22/14 1451  Weight: 55.3 kg (121 lb 14.6 oz) 55 kg (121 lb 4.1 oz)    History of present illness:  [name] is a 49 y.o. female with history of cirrhosis of liver, alcoholism, hepatitis C, diabetes mellitus type 2 was transferred back from behavioral health of the patient was complaining of increasing abdominal pain. Patient had come to the ER 2 nights ago with complaints of abdominal pain and suicidal/homicidal thoughts. After patient was cleared medically patient was transferred to behavioral health and over there patient did not endorse any suicidal thoughts or homicidal thoughts and was placed for depression and alcohol detox. Patient was complaining of persistent pain and has had nausea vomiting and was transferred back to [facility]. CT abdomen and pelvis only shows cholelithiasis without any signs of cholecystitis and also shows ascites. Patient's LFTs are elevated and has been evaluated previously chronically. Patient still complains of abdominal pain mostly in the epigastric area with some nausea and vomiting. Denies any blood in the vomitus and denies any diarrhea. Denies any chest pain or shortness of breath. Patient's last alcohol intake was 24 hours ago.   Hospital Course:  1-Abdominal pain;   Received 5 days of ceftriaxone.  S/P paracentesis: 3-30 yielding 180 cc. Body fluid: no growth to date.  Abdominal US; Cholelithiasis. Cirrhosis, ascites.  CT abdomen: 1. Hepatic cirrhosis with portal hypertension and splenomegaly. Splenomegaly is mildly progressed from prior exam. Development of small to moderate volume of intra-abdominal ascites. Cholelithiasis. Gallbladder wall thickening is a common finding in chronic liver disease. Mild colonic wall thickening involving the cecum and ascending colon, may reflect portal enteropathy. Mildly elevated lipases.  HIDA scan negative.  Continue with Oxycodone PRN.  Continue with ceftriaxone, covering for SBP, WBC increasing.  KUB: No evidence of bowel obstruction or significant generalized adynamic ileus. No acute finding. Mild splenomegaly. Appreciate Dr Carlean Purl evaluation.  Pain stable. Patient tolerating diet. Plan to discharge today.   History of depression - presently no suicidal or suicidal ideation. Continue home medications. Psych consulted. Outpatient rehab for alcohol if patient agrees.   Cirrhosis of liver: Elevated LFTs most likely secondary to alcoholic hepatitis - follow LFTs closely. Trending down, suspect secondary to alcohol intake.  Started spironolactone.  LFT stable.   Alcohol; on CIWA alcohol withdrawal. Finished CIWA protocol.   Constipation; lactulose. KUB negative for obstruction. Had BM.   Diabetes mellitus type 2 decrease : uncontrolled.  Continue with lantus, 15 units, adjust as needed.   Chronic leukopenia and thrombocytopenia - probably secondary to cirrhosis and alcoholism. Follow CBC closely. Stable. WBC stable.   Procedures:  Paracentesis.   HIDA negative  Consultations:  GI.   Discharge Exam: Filed Vitals:   07/26/14 0629  BP: 124/97  Pulse: 84  Temp: 98.4 F (36.9 C)  Resp:  16    General: NAD Cardiovascular: S 1, S 2 RRR Respiratory: CTA  Discharge  Instructions   Discharge Instructions    Diet Carb Modified    Complete by:  As directed      Increase activity slowly    Complete by:  As directed           Current Discharge Medication List    START taking these medications   Details  lactulose (CHRONULAC) 10 GM/15ML solution Take 45 mLs (30 g total) by mouth 2 (two) times daily. Qty: 240 mL, Refills: 0    methocarbamol (ROBAXIN) 500 MG tablet Take 1 tablet (500 mg total) by mouth 3 (three) times daily. Qty: 30 tablet, Refills: 0    oxyCODONE (OXY IR/ROXICODONE) 5 MG immediate release tablet Take 1-2 tablets (5-10 mg total) by mouth every 6 (six) hours as needed for moderate pain. Qty: 30 tablet, Refills: 0    spironolactone (ALDACTONE) 25 MG tablet Take 0.5 tablets (12.5 mg total) by mouth daily. Qty: 30 tablet, Refills: 0      CONTINUE these medications which have CHANGED   Details  insulin aspart (NOVOLOG) 100 UNIT/ML injection Inject 3 Units into the skin 3 (three) times daily before meals. Qty: 10 mL, Refills: 11    insulin glargine (LANTUS) 100 UNIT/ML injection Inject 0.15 mLs (15 Units total) into the skin at bedtime. Qty: 10 mL, Refills: 11      CONTINUE these medications which have NOT CHANGED   Details  diphenhydrAMINE (BENADRYL) 25 MG tablet Take 50 mg by mouth every 6 (six) hours as needed for sleep.    esomeprazole (NEXIUM) 40 MG capsule Take 40 mg by mouth daily as needed (heartburn).    ibuprofen (ADVIL,MOTRIN) 200 MG tablet Take 400 mg by mouth every 6 (six) hours as needed for moderate pain.    Multiple Vitamin (MULTIVITAMIN WITH MINERALS) TABS tablet Take 1 tablet by mouth daily.     sertraline (ZOLOFT) 100 MG tablet Take 150 mg by mouth daily.    tetrahydrozoline (VISINE) 0.05 % ophthalmic solution Place 1 drop into both eyes every morning.    zolpidem (AMBIEN) 10 MG tablet Take 1 tablet (10 mg total) by mouth at bedtime as needed for sleep. Qty: 30 tablet, Refills: 5      STOP taking these  medications     linagliptin (TRADJENTA) 5 MG TABS tablet      metFORMIN (GLUCOPHAGE) 500 MG tablet        Allergies  Allergen Reactions  . Betadine [Povidone Iodine] Hives   Follow-up Information    Follow up with Cathlean Cower, MD In 1 week.   Specialties:  Internal Medicine, Radiology   Contact information:   [address] [place] [address] [phone]        The results of significant diagnostics from this hospitalization (including imaging, microbiology, ancillary and laboratory) are listed below for reference.    Significant Diagnostic Studies: Dg Abd 1 View  07/23/2014   CLINICAL DATA:  Pt seems very lethargic, unable to give detailed history, relates she has been having continuing abd pain x 1 month s/p having her gallbladder removed. Pt c/o worsening pain x 3 days ago. Per EPIC chart, pt has elevate LFT's  EXAM: ABDOMEN - 1 VIEW  COMPARISON:  CT, 07/20/2014.  FINDINGS: Residual contrast is no alerted and colon. There is no colonic or small bowel dilation to suggest obstruction or significant generalized adynamic ileus.  Mild enlargement of the  spleen as noted on the current CT. Soft tissues otherwise unremarkable.  No significant bony abnormality.  IMPRESSION: 1. No evidence of bowel obstruction or significant generalized adynamic ileus. No acute finding. 2. Mild splenomegaly.   Electronically Signed   By: Lajean Manes M.D.   On: 07/23/2014 10:51   Nm Hepatobiliary Liver Func  07/21/2014   CLINICAL DATA:  Gallstones, abdominal drain subsequent evaluation  EXAM: NUCLEAR MEDICINE HEPATOBILIARY IMAGING  TECHNIQUE: Sequential images of the abdomen were obtained out to 60 minutes following intravenous administration of radiopharmaceutical.  RADIOPHARMACEUTICALS:  7.5 [employer] LA-45X Choletec  COMPARISON:  07/19/2014 and 07/20/2014 CT and ultrasound  FINDINGS: There is prompt attached either uptake. Gallbladder uptake is seen at 5 min. Small bowel uptake this is identified  35 min.  IMPRESSION: Normal examination   Electronically Signed   By: Skipper Cliche M.D.   On: 07/21/2014 09:48   Ct Abdomen Pelvis W Contrast  07/20/2014   CLINICAL DATA:  Severe abdominal pain for days, increasing nausea and vomiting. History of hysterectomy and appendectomy.  EXAM: CT ABDOMEN AND PELVIS WITH CONTRAST  TECHNIQUE: Multidetector CT imaging of the abdomen and pelvis was performed using the standard protocol following bolus administration of intravenous contrast.  CONTRAST:  157m OMNIPAQUE IOHEXOL 300 MG/ML SOLN, 522mOMNIPAQUE IOHEXOL 300 MG/ML SOLN  COMPARISON:  Ultrasound 12 hours prior.  CT 12/24/2013  FINDINGS: Minimal atelectasis in the right and left lower lobe. Minimal left pleural thickening without effusion.  Nodular hepatic contour consistent with cirrhosis. No focal enhancing hepatic lesion. Small to moderate volume of perihepatic ascites. Portal vein is patent. Splenomegaly, the spleen measures 15.3 x 13.3 x 5.7 cm for a volume of 703 mL. Mild perisplenic varices.  The gallbladder is contracted with gallstones and gallbladder wall thickening. There is no biliary dilatation.  No peripancreatic inflammatory change or ductal dilatation. The adrenal glands are normal. Kidneys demonstrate symmetric enhancement and excretion. Sub centimeter cyst in the upper left kidney.  Stomach is physiologically distended. Small hiatal hernia. There are no dilated or thickened bowel loops. Moderate stool throughout the colon. Minimal colonic wall thickening in the cecum and ascending colon, may reflect portal enteropathy. The appendix is not visualized.  Small to moderate ascites in the right pericolic gutter and tracking into the pelvis. Abdominal aorta is normal in caliber . No retroperitoneal adenopathy. Mild whole body wall edema suggesting third-spacing.  Within the pelvis the bladder is physiologically distended. The uterus is surgically absent. No adnexal mass. There is no free air.  There are  no acute or suspicious osseous abnormalities.  IMPRESSION: 1. Hepatic cirrhosis with portal hypertension and splenomegaly. No focal enhancing hepatic lesion. Splenomegaly is mildly progressed from prior exam. 2. Development of small to moderate volume of intra-abdominal ascites. 3. Cholelithiasis. Gallbladder wall thickening is a common finding in chronic liver disease. 4. Mild colonic wall thickening involving the cecum and ascending colon, may reflect portal enteropathy.   Electronically Signed   By: MeJeb Levering.D.   On: 07/20/2014 02:29   UsKoreabdomen Limited  07/19/2014   CLINICAL DATA:  RIGHT upper quadrant pain, history of hepatitis-C.  EXAM: US[place]oreaBDOMEN LIMITED - RIGHT UPPER QUADRANT  COMPARISON:  Abdominal ultrasound February 14, 2014 and CT of the abdomen and pelvis December 24, 2013  FINDINGS: Gallbladder:  Gallbladder is mildly distended, echogenic 7 mm layering gallstone with acoustic shadowing was present on prior CT. Gallbladder wall thickening up 4.7 mm. No sonographic Murphy's sign elicited. No pericholecystic fluid.  Common bile duct:  Diameter: 4.3 mm  Liver:  Lobulated contour consistent with known cirrhosis with partially imaged ascites.  IMPRESSION: Cholelithiasis. Mild gallbladder wall thickening most consistent with portal hypertension.  Cirrhosis and ascites.   Electronically Signed   By: Elon Alas   On: 07/19/2014 02:51   US Paracentesis  07/20/2014   CLINICAL DATA:  Abdominal pain. Ascites. Request paracentesis for possible spontaneous bacterial peritonitis.  EXAM: ULTRASOUND GUIDED PARACENTESIS  COMPARISON:  None.  PROCEDURE: An ultrasound guided paracentesis was thoroughly discussed with the patient and questions answered. The benefits, risks, alternatives and complications were also discussed. The patient understands and wishes to proceed with the procedure. Written consent was obtained.  Ultrasound was performed to localize and mark an adequate pocket of fluid in the  right lower quadrant of the abdomen. The area was then prepped and draped in the normal sterile fashion. 1% Lidocaine was used for local anesthesia. Under ultrasound guidance a 19 gauge Yueh catheter was introduced. Paracentesis was performed. The catheter was removed and a dressing applied.  COMPLICATIONS: None immediate  FINDINGS: A total of approximately 180 mL of clear, amber colored fluid was removed. A fluid sample was sent for laboratory analysis.  IMPRESSION: Successful ultrasound guided paracentesis yielding 180 mL of ascites.  Read by: Ascencion Dike PA-C   Electronically Signed   By: Jerilynn Mages.  Shick M.D.   On: 07/20/2014 11:35    Microbiology: Recent Results (from the past 240 hour(s))  Body fluid culture     Status: None   Collection Time: 07/20/14 10:00 AM  Result Value Ref Range Status   Specimen Description ASCITIC  Final   Special Requests NONE  Final   Gram Stain   Final    NO WBC SEEN NO ORGANISMS SEEN Performed at [facility]    Culture   Final    NO GROWTH 3 DAYS Performed at [facility]    Report Status 07/23/2014 FINAL  Final     Labs: Basic Metabolic Panel:  Recent Labs Lab 07/21/14 0534 07/22/14 0530 07/23/14 0527 07/24/14 0533 07/25/14 0549  NA 137 135 135 134* 134*  K 3.7 3.8 3.8 4.2 4.9  CL 99 98 98 98 98  CO2 33* '30 31 30 31  ' GLUCOSE 102* 159* 171* 212* 218*  BUN '8 12 13 13 20  ' CREATININE 0.44* 0.54 0.57 0.58 0.70  CALCIUM 8.5 8.6 8.6 8.7 8.8   Liver Function Tests:  Recent Labs Lab 07/21/14 0534 07/22/14 0530 07/23/14 0527 07/24/14 0533 07/25/14 0549  AST 170* 160* 118* 104* 122*  ALT 90* 94* 87* 82* 88*  ALKPHOS 140* 149* 153* 152* 143*  BILITOT 2.6* 2.3* 2.2* 2.0* 1.8*  PROT 6.8 6.9 7.3 7.3 7.1  ALBUMIN 2.8* 2.6* 2.9* 2.8* 2.8*    Recent Labs Lab 07/19/14 2222  LIPASE 53    Recent Labs Lab 07/25/14 1255  AMMONIA 34*   CBC:  Recent Labs Lab 07/20/14 0720 07/21/14 0534 07/22/14 0530 07/23/14 0527  07/24/14 0533  WBC 1.8* 1.8* 2.3* 2.8* 2.7*  NEUTROABS 0.8* 0.8* 1.1* 1.4* 1.3*  HGB 13.5 13.8 13.8 14.1 14.4  HCT 40.2 41.4 41.4 42.0 43.0  MCV 107.2* 108.9* 108.9* 108.0* 108.6*  PLT 36* 40* 54* 58* 51*   Cardiac Enzymes: No results for input(s): CKTOTAL, CKMB, CKMBINDEX, TROPONINI in the last 168 hours. BNP: BNP (last 3 results) No results for input(s): BNP in the last 8760 hours.  ProBNP (last 3 results) No results for input(s):  PROBNP in the last 8760 hours.  CBG:  Recent Labs Lab 07/25/14 0801 07/25/14 1201 07/25/14 1620 07/25/14 2058 07/26/14 0747  GLUCAP 221* 171* 224* 119* 183*       Signed:  Rhaya Coale A  [facility] 07/26/2014, 10:19 AM

## 2014-07-26 NOTE — Progress Notes (Signed)
[name] to be D/C'd Home per MD order.  Discussed prescriptions and follow up appointments with the patient. Prescriptions given to patient, medication list explained in detail. Pt verbalized understanding.    Medication List    STOP taking these medications        linagliptin 5 MG Tabs tablet  Commonly known as:  TRADJENTA     metFORMIN 500 MG tablet  Commonly known as:  GLUCOPHAGE      TAKE these medications        diphenhydrAMINE 25 MG tablet  Commonly known as:  BENADRYL  Take 50 mg by mouth every 6 (six) hours as needed for sleep.     esomeprazole 40 MG capsule  Commonly known as:  NEXIUM  Take 40 mg by mouth daily as needed (heartburn).     ibuprofen 200 MG tablet  Commonly known as:  ADVIL,MOTRIN  Take 400 mg by mouth every 6 (six) hours as needed for moderate pain.     insulin aspart 100 UNIT/ML injection  Commonly known as:  novoLOG  Inject 3 Units into the skin 3 (three) times daily before meals.     insulin glargine 100 UNIT/ML injection  Commonly known as:  LANTUS  Inject 0.15 mLs (15 Units total) into the skin at bedtime.     lactulose 10 GM/15ML solution  Commonly known as:  CHRONULAC  Take 45 mLs (30 g total) by mouth 2 (two) times daily.     methocarbamol 500 MG tablet  Commonly known as:  ROBAXIN  Take 1 tablet (500 mg total) by mouth 3 (three) times daily.     multivitamin with minerals Tabs tablet  Take 1 tablet by mouth daily.     oxyCODONE 5 MG immediate release tablet  Commonly known as:  Oxy IR/ROXICODONE  Take 1-2 tablets (5-10 mg total) by mouth every 6 (six) hours as needed for moderate pain.     sertraline 100 MG tablet  Commonly known as:  ZOLOFT  Take 150 mg by mouth daily.     spironolactone 25 MG tablet  Commonly known as:  ALDACTONE  Take 0.5 tablets (12.5 mg total) by mouth daily.     VISINE 0.05 % ophthalmic solution  Generic drug:  tetrahydrozoline  Place 1 drop into both eyes every morning.     zolpidem 10 MG  tablet  Commonly known as:  AMBIEN  Take 1 tablet (10 mg total) by mouth at bedtime as needed for sleep.        Filed Vitals:   07/26/14 0629  BP: 124/97  Pulse: 84  Temp: 98.4 F (36.9 C)  Resp: 16    Skin clean, dry and intact without evidence of skin break down, no evidence of skin tears noted. IV catheter discontinued intact. Site without signs and symptoms of complications. Dressing and pressure applied. Pt denies pain at this time. No complaints noted.  An After Visit Summary was printed and given to the patient. Patient escorted via [place], and D/C home via private auto.  Nonie Hoyer S 07/26/2014 1:51 PM

## 2014-07-26 NOTE — Progress Notes (Signed)
Inpatient Diabetes Program Recommendations  AACE/ADA: New Consensus Statement on Inpatient Glycemic Control (2013)  Target Ranges:  Prepandial:   less than 140 mg/dL      Peak postprandial:   less than 180 mg/dL (1-2 hours)      Critically ill patients:  140 - 180 mg/dL     Results for [name], [name] (MRN [id number]) as of 07/26/2014 09:16  Ref. Range 07/25/2014 08:01 07/25/2014 12:01 07/25/2014 16:20 07/25/2014 20:58  Glucose-Capillary Latest Range: 70-99 mg/dL 221 (H) 171 (H) 224 (H) 119 (H)    Results for [name], [name] (MRN [id number]) as of 07/26/2014 09:16  Ref. Range 07/26/2014 07:47  Glucose-Capillary Latest Range: 70-99 mg/dL 183 (H)     Home DM Meds: Lantus 30 units QHS       Novolog 20 units tidwc       Metformin 500 mg bid       Tradjenta 5 mg daily   Current Orders: Lantus 10 units QHS     Novolog Sensitive SSI tid     MD- Please consider increasing Lantus to 15 units QHS (50% of home dose)     Will follow Wyn Quaker RN, MSN, CDE Diabetes Coordinator Inpatient Diabetes Program Team Pager: [phone] (8a-5p)

## 2014-07-26 NOTE — Progress Notes (Signed)
   Subjective  No complaints this morning, ate breakfast   Objective   N&V has subsided. Abdominal pain likely abdominal wall origin from vomiting. Clinically improved Vital signs in last 24 hours: Temp:  [98.2 F (36.8 C)-98.4 F (36.9 C)] 98.4 F (36.9 C) (04/05 0629) Pulse Rate:  [64-84] 84 (04/05 0629) Resp:  [16-18] 16 (04/05 0629) BP: (96-124)/(61-97) 124/97 mmHg (04/05 0629) SpO2:  [94 %-99 %] 94 % (04/05 0629) Last BM Date: 07/23/14 General:    AA female in NAD Heart:  Regular rate and rhythm; no murmurs Lungs: Respirations even and unlabored, lungs CTA bilaterally Abdomen:  Distended, tight, mildly tender, normal B.S. Extremities:  Without edema. Neurologic:  Alert and oriented,  grossly normal neurologically. Psych:  Cooperative. Normal mood and affect.  Intake/Output from previous day: 04/04 0701 - 04/05 0700 In: 480 [P.O.:480] Out: -  Intake/Output this shift: Total I/O In: 480 [P.O.:480] Out: -   Lab Results:  Recent Labs  07/24/14 0533  WBC 2.7*  HGB 14.4  HCT 43.0  PLT 51*   BMET  Recent Labs  07/24/14 0533 07/25/14 0549  NA 134* 134*  K 4.2 4.9  CL 98 98  CO2 30 31  GLUCOSE 212* 218*  BUN 13 20  CREATININE 0.58 0.70  CALCIUM 8.7 8.8   LFT  Recent Labs  07/25/14 0549  PROT 7.1  ALBUMIN 2.8*  AST 122*  ALT 88*  ALKPHOS 143*  BILITOT 1.8*   PT/INR No results for input(s): LABPROT, INR in the last 72 hours.  Studies/Results: No results found.     Assessment / Plan:   N&V subsided, alcoholic liver disease,  Pt tolerating diet. OK to discharge  No GI follow up necessary  Principal Problem:   MDD (major depressive disorder) Active Problems:   Thrombocytopenia   Abdominal pain   Diabetes mellitus type 2, controlled   Cirrhosis of liver   Elevated LFTs   Alcoholism   Constipation     LOS: 6 days   Delfin Edis  07/26/2014, 10:26 AM

## 2014-08-02 ENCOUNTER — Inpatient Hospital Stay: Payer: Self-pay | Admitting: Internal Medicine

## 2014-08-04 ENCOUNTER — Ambulatory Visit: Payer: Self-pay | Admitting: Endocrinology

## 2014-08-04 ENCOUNTER — Telehealth: Payer: Self-pay | Admitting: Endocrinology

## 2014-08-04 ENCOUNTER — Ambulatory Visit: Payer: Self-pay | Admitting: Internal Medicine

## 2014-08-04 DIAGNOSIS — Z0289 Encounter for other administrative examinations: Secondary | ICD-10-CM

## 2014-08-04 NOTE — Telephone Encounter (Signed)
Patient no showed today's appt. Please advise on how to follow up. °A. No follow up necessary. °B. Follow up urgent. Contact patient immediately. °C. Follow up necessary. Contact patient and schedule visit in ___ days. °D. Follow up advised. Contact patient and schedule visit in ____weeks. ° °

## 2014-08-05 ENCOUNTER — Encounter: Payer: Self-pay | Admitting: *Deleted

## 2014-08-05 NOTE — Telephone Encounter (Signed)
Letter mailed

## 2014-08-19 ENCOUNTER — Other Ambulatory Visit: Payer: Self-pay | Admitting: Internal Medicine

## 2014-08-25 ENCOUNTER — Telehealth: Payer: Self-pay | Admitting: Internal Medicine

## 2014-08-25 NOTE — Telephone Encounter (Signed)
Dr. [name], are you on with this? It's not on her medication list and she has cancelled the last 2 appts she had with you.

## 2014-08-25 NOTE — Telephone Encounter (Signed)
i dont see where I have prescribed this in past.  I would have to decline for now, but could be addressed at an OV if she wants

## 2014-08-25 NOTE — Telephone Encounter (Signed)
Requesting verbal authorization on Lidocaine. Please call

## 2014-08-25 NOTE — Telephone Encounter (Signed)
Noted  

## 2014-09-27 ENCOUNTER — Telehealth: Payer: Self-pay | Admitting: Internal Medicine

## 2014-09-27 NOTE — Telephone Encounter (Signed)
Very sorry, but it is very unlikely that the insulin itself is causing the cramping.  Common causes are electrolyte abnormalities, hand overuse, fluid shifts possibly due to high and then lower sugars, or even dehydration.  We should continue the insulin for now, as this would be VERY unlikely to be the cause

## 2014-09-27 NOTE — Telephone Encounter (Signed)
Pt called in said that the insulin aspart (NOVOLOG) 100 UNIT/ML injection making her hands cramp and it is very painful.  Can something else be called in?     Best number -[phone]

## 2014-09-28 NOTE — Telephone Encounter (Signed)
Pt's spouse asked that I call pt on [phone]. Pt was advised and expressed understanding, agrees to continue medication as prescribed.

## 2014-10-17 ENCOUNTER — Telehealth: Payer: Self-pay | Admitting: *Deleted

## 2014-10-17 NOTE — Telephone Encounter (Signed)
Left message for patient to schedule f/u appt and a1c recheck.

## 2014-10-25 ENCOUNTER — Other Ambulatory Visit: Payer: Self-pay | Admitting: Internal Medicine

## 2014-10-26 ENCOUNTER — Telehealth: Payer: Self-pay | Admitting: Internal Medicine

## 2014-10-26 ENCOUNTER — Other Ambulatory Visit: Payer: Self-pay

## 2014-10-26 MED ORDER — METFORMIN HCL 500 MG PO TABS: ORAL_TABLET | ORAL | Status: DC

## 2014-10-26 NOTE — Telephone Encounter (Signed)
Patient need a refill of her metformin.

## 2014-11-07 ENCOUNTER — Inpatient Hospital Stay (HOSPITAL_COMMUNITY)
Admission: EM | Admit: 2014-11-07 | Discharge: 2014-11-16 | DRG: 441 | Disposition: A | Discharge status: Home / Self Care | Payer: No Typology Code available for payment source | Attending: Internal Medicine | Admitting: Internal Medicine

## 2014-11-07 ENCOUNTER — Inpatient Hospital Stay (HOSPITAL_COMMUNITY): Payer: No Typology Code available for payment source

## 2014-11-07 ENCOUNTER — Emergency Department (HOSPITAL_COMMUNITY): Payer: No Typology Code available for payment source

## 2014-11-07 ENCOUNTER — Encounter (HOSPITAL_COMMUNITY): Payer: Self-pay | Admitting: Emergency Medicine

## 2014-11-07 DIAGNOSIS — G8929 Other chronic pain: Secondary | ICD-10-CM | POA: Diagnosis present

## 2014-11-07 DIAGNOSIS — E119 Type 2 diabetes mellitus without complications: Secondary | ICD-10-CM

## 2014-11-07 DIAGNOSIS — K219 Gastro-esophageal reflux disease without esophagitis: Secondary | ICD-10-CM | POA: Diagnosis present

## 2014-11-07 DIAGNOSIS — Z86010 Personal history of colonic polyps: Secondary | ICD-10-CM | POA: Diagnosis not present

## 2014-11-07 DIAGNOSIS — D61818 Other pancytopenia: Secondary | ICD-10-CM | POA: Diagnosis present

## 2014-11-07 DIAGNOSIS — K859 Acute pancreatitis, unspecified: Secondary | ICD-10-CM | POA: Diagnosis present

## 2014-11-07 DIAGNOSIS — K8010 Calculus of gallbladder with chronic cholecystitis without obstruction: Secondary | ICD-10-CM | POA: Diagnosis present

## 2014-11-07 DIAGNOSIS — Z888 Allergy status to other drugs, medicaments and biological substances status: Secondary | ICD-10-CM

## 2014-11-07 DIAGNOSIS — F1020 Alcohol dependence, uncomplicated: Secondary | ICD-10-CM | POA: Diagnosis present

## 2014-11-07 DIAGNOSIS — Z833 Family history of diabetes mellitus: Secondary | ICD-10-CM

## 2014-11-07 DIAGNOSIS — R7401 Elevation of levels of liver transaminase levels: Secondary | ICD-10-CM

## 2014-11-07 DIAGNOSIS — K7031 Alcoholic cirrhosis of liver with ascites: Secondary | ICD-10-CM | POA: Diagnosis not present

## 2014-11-07 DIAGNOSIS — K766 Portal hypertension: Secondary | ICD-10-CM | POA: Diagnosis present

## 2014-11-07 DIAGNOSIS — F328 Other depressive episodes: Secondary | ICD-10-CM

## 2014-11-07 DIAGNOSIS — R740 Nonspecific elevation of levels of transaminase and lactic acid dehydrogenase [LDH]: Secondary | ICD-10-CM | POA: Diagnosis present

## 2014-11-07 DIAGNOSIS — K7469 Other cirrhosis of liver: Secondary | ICD-10-CM

## 2014-11-07 DIAGNOSIS — F329 Major depressive disorder, single episode, unspecified: Secondary | ICD-10-CM | POA: Diagnosis present

## 2014-11-07 DIAGNOSIS — E1165 Type 2 diabetes mellitus with hyperglycemia: Secondary | ICD-10-CM | POA: Diagnosis present

## 2014-11-07 DIAGNOSIS — R140 Abdominal distension (gaseous): Secondary | ICD-10-CM | POA: Diagnosis present

## 2014-11-07 DIAGNOSIS — K5900 Constipation, unspecified: Secondary | ICD-10-CM | POA: Diagnosis present

## 2014-11-07 DIAGNOSIS — B1920 Unspecified viral hepatitis C without hepatic coma: Secondary | ICD-10-CM | POA: Diagnosis present

## 2014-11-07 DIAGNOSIS — Z66 Do not resuscitate: Secondary | ICD-10-CM | POA: Diagnosis present

## 2014-11-07 DIAGNOSIS — K7460 Unspecified cirrhosis of liver: Secondary | ICD-10-CM | POA: Diagnosis not present

## 2014-11-07 DIAGNOSIS — E871 Hypo-osmolality and hyponatremia: Secondary | ICD-10-CM | POA: Diagnosis not present

## 2014-11-07 DIAGNOSIS — R188 Other ascites: Secondary | ICD-10-CM | POA: Diagnosis present

## 2014-11-07 DIAGNOSIS — R112 Nausea with vomiting, unspecified: Secondary | ICD-10-CM

## 2014-11-07 DIAGNOSIS — K659 Peritonitis, unspecified: Secondary | ICD-10-CM | POA: Diagnosis present

## 2014-11-07 DIAGNOSIS — D696 Thrombocytopenia, unspecified: Secondary | ICD-10-CM

## 2014-11-07 DIAGNOSIS — Z90710 Acquired absence of both cervix and uterus: Secondary | ICD-10-CM

## 2014-11-07 DIAGNOSIS — R161 Splenomegaly, not elsewhere classified: Secondary | ICD-10-CM | POA: Diagnosis present

## 2014-11-07 DIAGNOSIS — Z794 Long term (current) use of insulin: Secondary | ICD-10-CM

## 2014-11-07 DIAGNOSIS — R17 Unspecified jaundice: Secondary | ICD-10-CM

## 2014-11-07 DIAGNOSIS — R748 Abnormal levels of other serum enzymes: Secondary | ICD-10-CM | POA: Diagnosis present

## 2014-11-07 DIAGNOSIS — Z87891 Personal history of nicotine dependence: Secondary | ICD-10-CM | POA: Diagnosis not present

## 2014-11-07 DIAGNOSIS — Z791 Long term (current) use of non-steroidal anti-inflammatories (NSAID): Secondary | ICD-10-CM | POA: Diagnosis not present

## 2014-11-07 DIAGNOSIS — R100 Acute abdomen: Secondary | ICD-10-CM | POA: Diagnosis not present

## 2014-11-07 DIAGNOSIS — Z8719 Personal history of other diseases of the digestive system: Secondary | ICD-10-CM | POA: Diagnosis not present

## 2014-11-07 DIAGNOSIS — Z809 Family history of malignant neoplasm, unspecified: Secondary | ICD-10-CM

## 2014-11-07 DIAGNOSIS — F419 Anxiety disorder, unspecified: Secondary | ICD-10-CM | POA: Diagnosis present

## 2014-11-07 DIAGNOSIS — Z79899 Other long term (current) drug therapy: Secondary | ICD-10-CM | POA: Diagnosis not present

## 2014-11-07 DIAGNOSIS — R932 Abnormal findings on diagnostic imaging of liver and biliary tract: Secondary | ICD-10-CM | POA: Diagnosis not present

## 2014-11-07 DIAGNOSIS — E876 Hypokalemia: Secondary | ICD-10-CM | POA: Diagnosis present

## 2014-11-07 DIAGNOSIS — R109 Unspecified abdominal pain: Secondary | ICD-10-CM | POA: Diagnosis present

## 2014-11-07 DIAGNOSIS — K8020 Calculus of gallbladder without cholecystitis without obstruction: Secondary | ICD-10-CM | POA: Diagnosis present

## 2014-11-07 LAB — CBG MONITORING, ED
GLUCOSE-CAPILLARY: 275 mg/dL — AB (ref 65–99)
Glucose-Capillary: 373 mg/dL — ABNORMAL HIGH (ref 65–99)

## 2014-11-07 LAB — CBC WITH DIFFERENTIAL/PLATELET
Basophils Absolute: 0 10*3/uL (ref 0.0–0.1)
Basophils Relative: 1 % (ref 0–1)
EOS ABS: 0.1 10*3/uL (ref 0.0–0.7)
EOS PCT: 3 % (ref 0–5)
HEMATOCRIT: 45.7 % (ref 36.0–46.0)
HEMOGLOBIN: 15.5 g/dL — AB (ref 12.0–15.0)
LYMPHS ABS: 0.7 10*3/uL (ref 0.7–4.0)
Lymphocytes Relative: 23 % (ref 12–46)
MCH: 36 pg — AB (ref 26.0–34.0)
MCHC: 33.9 g/dL (ref 30.0–36.0)
MCV: 106.3 fL — ABNORMAL HIGH (ref 78.0–100.0)
Monocytes Absolute: 0.4 10*3/uL (ref 0.1–1.0)
Monocytes Relative: 11 % (ref 3–12)
NEUTROS ABS: 2 10*3/uL (ref 1.7–7.7)
NEUTROS PCT: 63 % (ref 43–77)
Platelets: 68 10*3/uL — ABNORMAL LOW (ref 150–400)
RBC: 4.3 MIL/uL (ref 3.87–5.11)
RDW: 13.9 % (ref 11.5–15.5)
WBC: 3.2 10*3/uL — ABNORMAL LOW (ref 4.0–10.5)

## 2014-11-07 LAB — COMPREHENSIVE METABOLIC PANEL
ALT: 97 U/L — AB (ref 14–54)
ANION GAP: 9 (ref 5–15)
AST: 119 U/L — AB (ref 15–41)
Albumin: 3.4 g/dL — ABNORMAL LOW (ref 3.5–5.0)
Alkaline Phosphatase: 134 U/L — ABNORMAL HIGH (ref 38–126)
BUN: 8 mg/dL (ref 6–20)
CALCIUM: 9.3 mg/dL (ref 8.9–10.3)
CO2: 23 mmol/L (ref 22–32)
CREATININE: 0.58 mg/dL (ref 0.44–1.00)
Chloride: 99 mmol/L — ABNORMAL LOW (ref 101–111)
GFR calc Af Amer: >60 mL/min (ref >60)
GFR calc non Af Amer: >60 mL/min (ref >60)
Glucose, Bld: 409 mg/dL — ABNORMAL HIGH (ref 65–99)
Potassium: 3.8 mmol/L (ref 3.5–5.1)
Sodium: 131 mmol/L — ABNORMAL LOW (ref 135–145)
Total Bilirubin: 3.4 mg/dL — ABNORMAL HIGH (ref 0.3–1.2)
Total Protein: 8.5 g/dL — ABNORMAL HIGH (ref 6.5–8.1)

## 2014-11-07 LAB — BODY FLUID CELL COUNT WITH DIFFERENTIAL
Lymphs, Fluid: 15 %
Monocyte-Macrophage-Serous Fluid: 81 % (ref 50–90)
Neutrophil Count, Fluid: 4 % (ref 0–25)
Total Nucleated Cell Count, Fluid: 325 cu mm (ref 0–1000)

## 2014-11-07 LAB — URINALYSIS, ROUTINE W REFLEX MICROSCOPIC
Bilirubin Urine: NEGATIVE
Glucose, UA: >1000 mg/dL — AB
Hgb urine dipstick: NEGATIVE
Ketones, ur: 15 mg/dL — AB
Leukocytes, UA: NEGATIVE
NITRITE: NEGATIVE
Protein, ur: NEGATIVE mg/dL
Specific Gravity, Urine: 1.041 — ABNORMAL HIGH (ref 1.005–1.030)
UROBILINOGEN UA: 1 mg/dL (ref 0.0–1.0)
pH: 6 (ref 5.0–8.0)

## 2014-11-07 LAB — GLUCOSE, CAPILLARY
GLUCOSE-CAPILLARY: 329 mg/dL — AB (ref 65–99)
Glucose-Capillary: 359 mg/dL — ABNORMAL HIGH (ref 65–99)

## 2014-11-07 LAB — URINE MICROSCOPIC-ADD ON

## 2014-11-07 LAB — I-STAT TROPONIN, ED: Troponin i, poc: 0 ng/mL (ref 0.00–0.08)

## 2014-11-07 LAB — GRAM STAIN

## 2014-11-07 LAB — LIPASE, BLOOD: Lipase: 57 U/L — ABNORMAL HIGH (ref 22–51)

## 2014-11-07 LAB — PATHOLOGIST SMEAR REVIEW

## 2014-11-07 LAB — I-STAT CG4 LACTIC ACID, ED: Lactic Acid, Venous: 1.71 mmol/L (ref 0.5–2.0)

## 2014-11-07 MED ORDER — ONDANSETRON HCL 4 MG/2ML IJ SOLN
4.0000 mg | Freq: Once | INTRAMUSCULAR | Status: AC
  Administered 2014-11-07: 4 mg via INTRAVENOUS
  Filled 2014-11-07: qty 2

## 2014-11-07 MED ORDER — HYDROMORPHONE HCL 1 MG/ML IJ SOLN
1.0000 mg | INTRAMUSCULAR | Status: DC | PRN
  Administered 2014-11-07 – 2014-11-13 (×18): 1 mg via INTRAVENOUS
  Filled 2014-11-07 (×18): qty 1

## 2014-11-07 MED ORDER — SPIRONOLACTONE 50 MG PO TABS
50.0000 mg | ORAL_TABLET | Freq: Every day | ORAL | Status: DC
  Administered 2014-11-07: 50 mg via ORAL
  Filled 2014-11-07 (×2): qty 1

## 2014-11-07 MED ORDER — INSULIN GLARGINE 100 UNIT/ML ~~LOC~~ SOLN
15.0000 [IU] | Freq: Every day | SUBCUTANEOUS | Status: DC
  Administered 2014-11-07: 15 [IU] via SUBCUTANEOUS
  Filled 2014-11-07: qty 0.15

## 2014-11-07 MED ORDER — SODIUM CHLORIDE 0.9 % IJ SOLN: 3.0000 mL | INTRAMUSCULAR | Status: DC | PRN

## 2014-11-07 MED ORDER — SODIUM CHLORIDE 0.9 % IV BOLUS (SEPSIS)
1000.0000 mL | Freq: Once | INTRAVENOUS | Status: AC
  Administered 2014-11-07: 1000 mL via INTRAVENOUS

## 2014-11-07 MED ORDER — INSULIN ASPART 100 UNIT/ML ~~LOC~~ SOLN
5.0000 [IU] | Freq: Three times a day (TID) | SUBCUTANEOUS | Status: DC
  Administered 2014-11-07 – 2014-11-15 (×22): 5 [IU] via SUBCUTANEOUS

## 2014-11-07 MED ORDER — MORPHINE SULFATE 4 MG/ML IJ SOLN
4.0000 mg | Freq: Once | INTRAMUSCULAR | Status: AC
  Administered 2014-11-07: 4 mg via INTRAVENOUS
  Filled 2014-11-07: qty 1

## 2014-11-07 MED ORDER — OXYCODONE HCL 5 MG PO TABS
5.0000 mg | ORAL_TABLET | ORAL | Status: DC | PRN
  Administered 2014-11-07 – 2014-11-16 (×24): 5 mg via ORAL
  Filled 2014-11-07 (×24): qty 1

## 2014-11-07 MED ORDER — DIPHENHYDRAMINE HCL 25 MG PO TABS
50.0000 mg | ORAL_TABLET | Freq: Every evening | ORAL | Status: DC | PRN
  Filled 2014-11-07: qty 2

## 2014-11-07 MED ORDER — PANTOPRAZOLE SODIUM 40 MG PO TBEC
40.0000 mg | DELAYED_RELEASE_TABLET | Freq: Every day | ORAL | Status: DC
  Administered 2014-11-07 – 2014-11-15 (×9): 40 mg via ORAL
  Filled 2014-11-07 (×10): qty 1

## 2014-11-07 MED ORDER — HYDROMORPHONE HCL 1 MG/ML IJ SOLN: 1.0000 mg | INTRAMUSCULAR | Status: AC | PRN

## 2014-11-07 MED ORDER — FUROSEMIDE 10 MG/ML IJ SOLN
40.0000 mg | Freq: Two times a day (BID) | INTRAMUSCULAR | Status: DC
  Administered 2014-11-07 – 2014-11-09 (×5): 40 mg via INTRAVENOUS
  Filled 2014-11-07 (×6): qty 4

## 2014-11-07 MED ORDER — ADULT MULTIVITAMIN W/MINERALS CH
1.0000 | ORAL_TABLET | Freq: Every day | ORAL | Status: DC
  Administered 2014-11-08 – 2014-11-15 (×8): 1 via ORAL
  Filled 2014-11-07 (×9): qty 1

## 2014-11-07 MED ORDER — INSULIN ASPART 100 UNIT/ML ~~LOC~~ SOLN
0.0000 [IU] | Freq: Every day | SUBCUTANEOUS | Status: DC
Start: 2014-11-07 — End: 2014-11-16
  Administered 2014-11-07 – 2014-11-08 (×2): 5 [IU] via SUBCUTANEOUS
  Administered 2014-11-10: 4 [IU] via SUBCUTANEOUS

## 2014-11-07 MED ORDER — DEXTROSE 5 % IV SOLN
1.0000 g | Freq: Once | INTRAVENOUS | Status: DC
  Administered 2014-11-07: 1 g via INTRAVENOUS
  Filled 2014-11-07: qty 10

## 2014-11-07 MED ORDER — ONDANSETRON HCL 4 MG PO TABS: 4.0000 mg | ORAL_TABLET | Freq: Four times a day (QID) | ORAL | Status: DC | PRN

## 2014-11-07 MED ORDER — HYDROMORPHONE HCL 1 MG/ML IJ SOLN
1.0000 mg | Freq: Once | INTRAMUSCULAR | Status: AC
  Administered 2014-11-07: 1 mg via INTRAVENOUS
  Filled 2014-11-07: qty 1

## 2014-11-07 MED ORDER — SERTRALINE HCL 50 MG PO TABS
150.0000 mg | ORAL_TABLET | Freq: Every day | ORAL | Status: DC
  Administered 2014-11-07 – 2014-11-15 (×9): 150 mg via ORAL
  Filled 2014-11-07 (×10): qty 1

## 2014-11-07 MED ORDER — ONDANSETRON HCL 4 MG/2ML IJ SOLN: 4.0000 mg | Freq: Three times a day (TID) | INTRAMUSCULAR | Status: DC | PRN

## 2014-11-07 MED ORDER — FUROSEMIDE 40 MG PO TABS: 40.0000 mg | ORAL_TABLET | Freq: Once | ORAL | Status: DC

## 2014-11-07 MED ORDER — INSULIN ASPART 100 UNIT/ML ~~LOC~~ SOLN
0.0000 [IU] | Freq: Three times a day (TID) | SUBCUTANEOUS | Status: DC
  Administered 2014-11-08: 3 [IU] via SUBCUTANEOUS
  Administered 2014-11-08: 5 [IU] via SUBCUTANEOUS
  Administered 2014-11-08: 3 [IU] via SUBCUTANEOUS
  Administered 2014-11-09: 2 [IU] via SUBCUTANEOUS
  Administered 2014-11-09 (×2): 3 [IU] via SUBCUTANEOUS
  Administered 2014-11-10: 7 [IU] via SUBCUTANEOUS
  Administered 2014-11-11 (×2): 2 [IU] via SUBCUTANEOUS
  Administered 2014-11-11: 5 [IU] via SUBCUTANEOUS
  Administered 2014-11-12: 7 [IU] via SUBCUTANEOUS
  Administered 2014-11-12: 2 [IU] via SUBCUTANEOUS
  Administered 2014-11-12: 7 [IU] via SUBCUTANEOUS
  Administered 2014-11-13: 3 [IU] via SUBCUTANEOUS
  Administered 2014-11-13: 2 [IU] via SUBCUTANEOUS
  Administered 2014-11-13: 5 [IU] via SUBCUTANEOUS
  Administered 2014-11-14: 1 [IU] via SUBCUTANEOUS
  Administered 2014-11-14: 5 [IU] via SUBCUTANEOUS
  Administered 2014-11-14: 2 [IU] via SUBCUTANEOUS
  Administered 2014-11-15: 5 [IU] via SUBCUTANEOUS
  Administered 2014-11-15: 1 [IU] via SUBCUTANEOUS

## 2014-11-07 MED ORDER — BISACODYL 5 MG PO TBEC
10.0000 mg | DELAYED_RELEASE_TABLET | Freq: Four times a day (QID) | ORAL | Status: AC
  Administered 2014-11-07 – 2014-11-08 (×4): 10 mg via ORAL
  Filled 2014-11-07 (×5): qty 2

## 2014-11-07 MED ORDER — ONDANSETRON HCL 4 MG/2ML IJ SOLN
4.0000 mg | Freq: Four times a day (QID) | INTRAMUSCULAR | Status: DC | PRN
  Administered 2014-11-07 – 2014-11-08 (×3): 4 mg via INTRAVENOUS
  Filled 2014-11-07 (×3): qty 2

## 2014-11-07 MED ORDER — SODIUM CHLORIDE 0.9 % IV SOLN: 250.0000 mL | INTRAVENOUS | Status: DC | PRN

## 2014-11-07 MED ORDER — IOHEXOL 300 MG/ML  SOLN
50.0000 mL | Freq: Once | INTRAMUSCULAR | Status: AC | PRN
  Administered 2014-11-07: 50 mL via ORAL

## 2014-11-07 MED ORDER — SODIUM CHLORIDE 0.9 % IJ SOLN
3.0000 mL | Freq: Two times a day (BID) | INTRAMUSCULAR | Status: DC
  Administered 2014-11-07 – 2014-11-13 (×7): 3 mL via INTRAVENOUS

## 2014-11-07 NOTE — ED Notes (Signed)
Blanche notifed of elevated cbg.  Verbal order given by nurse-blanche to execute cbg.

## 2014-11-07 NOTE — ED Notes (Signed)
Awake. Verbally responsive. A/O x4. Resp even and unlabored. No audible adventitious breath sounds noted. ABC's intact. SR on monitor. IV infusing NS at 63ml/hr.

## 2014-11-07 NOTE — Progress Notes (Signed)
Patient refused soap suds enema -- would like to see if Dulcolax will help her.

## 2014-11-07 NOTE — H&P (Addendum)
[facility] History and Physical  [name] BLT:[id number] DOB: 14-Apr-1966 DOA: 11/07/2014   PCP: Cathlean Cower, MD    Chief Complaint: abdominal pain  HPI: [name] is a 49 y.o. 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 This is a constant pain with no exacerbating factors.  It has been worse for a few days. She has been taking about 6 Ibuprofen for it daily. She has had chills but no fever. One episode of non-bloody vomiting yesterday but after this she was able to eat without trouble. No diarrhea- actually is constipated. Abdomen was distended with ascitic fluid-  she states it feels like she was having twins. She cannot recall if she took the spironolactone that was prescribed to her when discharged in May. CT did not show any pathology. Pain resolved after Dilaudid.    General: The patient denies anorexia, fever, weight loss Cardiac: Denies chest pain, syncope, palpitations, pedal edema  Respiratory: Denies cough, shortness of breath, wheezing GI: per HPI GU: Denies hematuria, incontinence, dysuria  Musculoskeletal: Denies arthritis  Skin: Denies suspicious skin lesions Neurologic: Denies focal weakness or numbness, change in vision Psychiatry: Denies depression or anxiety. Hematologic: + easy bruising or bleeding  All other systems reviewed and found to be negative.  Past Medical History  Diagnosis Date  . Impaired glucose tolerance 11/07/2010  . Hepatitis C approx dx 2005    no tx to date  . Pancreatitis     HX of  . Alcohol dependency     at least [facility] admissions 2007- 09/2013 for detox.   Marland Kitchen GERD (gastroesophageal reflux disease)   . Chronic constipation   . Gallstones   . Drug dependency     Hx of cocaine use  . Anxiety and depression 08/2011  . Rheumatoid factor positive   . Hemochromatosis     last phlebotomy tx ~ 2012. liver bx  2006  . Pancytopenia   . Abnormal vaginal bleeding     uterine fibroid  . Gallstone 2006  . Chronic abdominal pain 2011  . Cirrhosis 11/09/2010  . Type II or unspecified type diabetes mellitus without mention of complication, uncontrolled 09/17/2011  . Major depression 11/2012, 09/2013    [facility] admissions for this, suicide attempt by OD Ambien (2014), Prozac (2015),ETOHism   . Anxiety     Past Surgical History  Procedure Laterality Date  . Appendectomy    . Cesarean section  x 2  . Foot surgery  2013  . Abdominal hysterectomy  12/17/2011    Procedure: HYSTERECTOMY ABDOMINAL;  Surgeon: Gus Height, MD;  Location: [facility];  Service: Gynecology;  Laterality: N/A;  . Salpingoophorectomy  12/17/2011    Procedure: SALPINGO OOPHERECTOMY;  Surgeon: Gus Height, MD;  Location: [facility];  Service: Gynecology;  Laterality: Bilateral;  . Esophagogastroduodenoscopy N/A 12/10/2013    Procedure: ESOPHAGOGASTRODUODENOSCOPY (EGD);  Surgeon: Ladene Artist, MD;  Location: [facility];  Service: Endoscopy;  Laterality: N/A;  . Percutaneous liver biopsy  2011  . Colonoscopy N/A 02/18/2014    Procedure: COLONOSCOPY;  Surgeon: Gatha Mayer, MD;  Location: [facility];  Service: Endoscopy;  Laterality: N/A;    Social History: does not smoke or drink alcohol - quit drinking 1 month ago Lives at home with husband   Allergies  Allergen Reactions  . Betadine [Povidone Iodine] Hives    Family history:  Family History  Problem Relation Age of Onset  . Cancer Mother     ovarian  . Diabetes Father       Prior to Admission medications   Medication Sig Start Date End Date Taking? Authorizing Provider  diphenhydrAMINE (BENADRYL) 25 MG tablet Take 50 mg by mouth at bedtime as needed for sleep.    Yes Historical Provider, MD  esomeprazole (NEXIUM) 40 MG capsule Take 40 mg by mouth daily as needed (heartburn).   Yes Historical Provider, MD  ibuprofen (ADVIL,MOTRIN) 200 MG tablet Take 400 mg by mouth 3 (three)  times daily.    Yes Historical Provider, MD  insulin aspart (NOVOLOG) 100 UNIT/ML injection Inject 3 Units into the skin 3 (three) times daily before meals. Patient taking differently: Inject 5 Units into the skin 3 (three) times daily with meals.  07/26/14  Yes Belkys A Regalado, MD  insulin glargine (LANTUS) 100 UNIT/ML injection Inject 0.15 mLs (15 Units total) into the skin at bedtime. Patient taking differently: Inject 15 Units into the skin at bedtime. At 10pm 07/26/14  Yes Belkys A Regalado, MD  metFORMIN (GLUCOPHAGE) 500 MG tablet TAKE 1 TABLET (500 MG TOTAL) BY MOUTH 2 (TWO) TIMES DAILY WITH A MEAL. 10/25/14  Yes Biagio Borg, MD  Multiple Vitamin (MULTIVITAMIN WITH MINERALS) TABS tablet Take 1 tablet by mouth daily.    Yes Historical Provider, MD  sertraline (ZOLOFT) 100 MG tablet Take 150 mg by mouth daily.   Yes Historical Provider, MD  lactulose (CHRONULAC) 10 GM/15ML solution Take 45 mLs (30 g total) by mouth 2 (two) times daily. Patient not taking: Reported on 11/07/2014 07/26/14   Belkys A Regalado, MD  metFORMIN (GLUCOPHAGE) 500 MG tablet TAKE 1 TABLET (500 MG TOTAL) BY MOUTH 2 (TWO) TIMES DAILY WITH A MEAL. Patient not taking: Reported on 11/07/2014 10/26/14   Biagio Borg, MD  methocarbamol (ROBAXIN) 500 MG tablet Take 1 tablet (500 mg total) by mouth 3 (three) times daily. Patient not taking: Reported on 11/07/2014 07/26/14   Belkys A Regalado, MD  oxyCODONE (OXY IR/ROXICODONE) 5 MG immediate release tablet Take 1-2 tablets (5-10 mg total) by mouth every 6 (six) hours as needed for moderate pain. Patient not taking: Reported on 11/07/2014 07/26/14   Belkys A Regalado, MD  spironolactone (ALDACTONE) 25 MG tablet Take 0.5 tablets (12.5 mg total) by mouth daily. Patient not taking: Reported on 11/07/2014 07/26/14   Elmarie Shiley, MD     Physical Exam: Filed Vitals:   11/07/14 1105 11/07/14 1109 11/07/14 1137 11/07/14 1223  BP: 131/77 119/79 127/78 127/86  Pulse:    75  Temp:    98.1 F  (36.7 C)  TempSrc:    Oral  Resp:    13  Height:      Weight:      SpO2:    96%     General: AAO x3, no distress HEENT: Normocephalic and Atraumatic, Mucous membranes pink                PERRLA; EOM intact; No scleral icterus,                 Nares: Patent, Oropharynx: Clear, Fair Dentition                 Neck: FROM, no cervical lymphadenopathy, thyromegaly, carotid bruit or JVD;  Breasts: deferred CHEST WALL: No tenderness  CHEST: Normal respiration, clear to auscultation bilaterally  HEART: Regular rate and rhythm; no murmurs rubs or  gallops  BACK: No kyphosis or scoliosis; no CVA tenderness  GI: Positive Bowel Sounds, soft, mild-tenderness difusely (just received Dilaudid) ; + moderate distension after paracentesis Rectal Exam: deferred MSK: No cyanosis, clubbing, or edema Genitalia: not examined  SKIN:  no rash or ulceration  CNS: Alert and Oriented x 4, Nonfocal exam, CN 2-12 intact  Labs on Admission:  Basic Metabolic Panel:  Recent Labs Lab 11/07/14 0644  NA 131*  K 3.8  CL 99*  CO2 23  GLUCOSE 409*  BUN 8  CREATININE 0.58  CALCIUM 9.3   Liver Function Tests:  Recent Labs Lab 11/07/14 0644  AST 119*  ALT 97*  ALKPHOS 134*  BILITOT 3.4*  PROT 8.5*  ALBUMIN 3.4*    Recent Labs Lab 11/07/14 0644  LIPASE 57*   No results for input(s): AMMONIA in the last 168 hours. CBC:  Recent Labs Lab 11/07/14 0644  WBC 3.2*  NEUTROABS 2.0  HGB 15.5*  HCT 45.7  MCV 106.3*  PLT 68*   Cardiac Enzymes: No results for input(s): CKTOTAL, CKMB, CKMBINDEX, TROPONINI in the last 168 hours.  BNP (last 3 results) No results for input(s): BNP in the last 8760 hours.  ProBNP (last 3 results) No results for input(s): PROBNP in the last 8760 hours.  CBG:  Recent Labs Lab 11/07/14 0627 11/07/14 1227  GLUCAP 373* 275*    Radiological Exams on Admission: Ct Abdomen Pelvis Wo Contrast  11/07/2014   CLINICAL DATA:  Generalized abdominal pain and  distention. Previously documented hepatic cirrhosis.  EXAM: CT ABDOMEN AND PELVIS WITHOUT CONTRAST  TECHNIQUE: Multidetector CT imaging of the abdomen and pelvis was performed following the standard protocol without IV contrast. Oral contrast was administered.  COMPARISON:  July 20, 2014  FINDINGS: There is mild bibasilar lung atelectasis. Lung bases are otherwise clear. There is a small hiatal hernia.  The liver again has a diffusely nodular opacity with enlargement of the caudate lobe consistent with cirrhosis. No focal liver lesions are identified on this noncontrast enhanced study. There is again noted cholelithiasis. The gallbladder wall is mildly thickened which may be due to nearby ascites, a finding also present on the previous study. There is ascites surrounding the liver and spleen. Ascites is also noted, moderate, lower in the abdomen and in the pelvis. There is no biliary duct dilatation. There are stable mildly prominent periportal lymph nodes, probably due to the underlying cirrhosis, a stable finding.  The spleen remains prominent measuring 15.6 x 12.6 x 5.2 cm, essentially stable from prior study. Perisplenic varices remain. No focal splenic lesions are identified on this noncontrast enhanced study.  Pancreas and adrenals appear within normal limits. Kidneys bilaterally show no mass or hydronephrosis on either side. There is no renal or ureteral calculus on either side.  In the pelvis, urinary bladder is midline with normal wall thickness. There is no pelvic mass appreciable. The uterus and appendix are absent.  There is no bowel obstruction. There is no appreciable free air or portal venous air. There is no abscess apparent. No new lymph node prominence is identified compared to the prior study. There is no abdominal aortic aneurysm. There are no blastic or lytic bone lesions.  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 There is moderate ascites. There is  cholelithiasis. Mild gallbladder wall thickening may well be due to the adjacent ascites.  No bowel obstruction apparent. No abscess. Uterus and appendix absent. No pancreatic lesions are appreciated on this study. Stable small hiatal  hernia.   Electronically Signed   By: Lowella Grip III M.D.   On: 11/07/2014 09:02   Dg Abd Acute W/chest  11/07/2014   CLINICAL DATA:  Two week history abdominal pain and bloating. History of cirrhosis and pancreatitis  EXAM: DG ABDOMEN ACUTE W/ 1V CHEST  COMPARISON:  Chest radiograph February 14, 2014; abdomen radiographs July 23, 2014  FINDINGS: PA chest: There is no edema or consolidation. The heart size and pulmonary vascularity are normal. No adenopathy.  Supine and upright abdomen: There is diffuse stool throughout much of the colon. There is no bowel dilatation or air-fluid level suggesting obstruction. No free air. There are apparent phleboliths in the pelvis.  IMPRESSION: Diffuse stool throughout much of colon. No obstruction or free air seen. No lung edema or consolidation.   Electronically Signed   By: Lowella Grip III M.D.   On: 11/07/2014 07:23    EKG: Independently reviewed. Sinus rhythm  Assessment/Plan Principal Problem:   Abdominal pain, acute - diffuse pain for 2 wks-  mildly elevated in Lipase- doubtful it is pancreatitis- CT and Xray report above (a)- has diffuse stool noted on imaging- soap suds enema every 6 hrs x 2 , dulcolax orally every 6 hrs x 4 doses- xay in AM to re-assess need for more laxatives- ran out of her Lactulose a while ago- has not become encephalopathic... (b) ascites-started on Rocephin by ER for possible SBP which I have d/c'ed- I ordered ultrasound guided paracentesis stat to r/o SBP- awaiting results- not on diuretics at home- discussed need for her to start them and monitor abdominal distension closely- start IV Lasix and Aldactone today- follow K and sodium -Stop NSAIDs due to GERD and high risk for bleed with portal  HTN/ thrombocytopenia/ - unfortunately will need to give PRN Narcotics for her pain- will not use long term  ADDENDUM- ascitic fluid negative for SBP- will treat constipation and ascites- hopefully home tomorrow without narcotics prescription   Active Problems:   DM (diabetes mellitus), type 2 - cont Lantus and 5 u novolog with meals    MDD (major depressive disorder) - cont Zoloft    Cirrhosis of liver - multiple reasons- hemachromatosis, hep c and alcohol use- follows with Dr Carlean Purl - check INR again and calculate child pugh - bili higher than her usual- other LFTs stable- follow  - above treatment for ascites - stopped drinking 1 mo ago - no hepatic encephalopathy despite no Lactulose   Thrombocytopenia/ leukopenia - due to cirrhosis/ portal HTN  Hyponatremia - follow while on diuretics- may need to stop diuretics or keep on just a low dose     Consulted: none  Code Status: DNR but does not want to be kept alive if she is a "vegetable"  Family Communication:   DVT Prophylaxis:SCDs  Time spent: 68 min  [place], MD [facility]  If 7PM-7AM, please contact night-coverage [email or web address] 11/07/2014, 12:47 PM

## 2014-11-07 NOTE — ED Notes (Signed)
Pt c/o generalized abdominal pain, sharp in nature, 10/10; bilateral flank pain with increased urination.

## 2014-11-07 NOTE — ED Notes (Signed)
Pt to US for paracentesis

## 2014-11-07 NOTE — ED Notes (Signed)
Admitting doctor in the room at this time.

## 2014-11-07 NOTE — ED Notes (Signed)
Patient transported to Ultrasound 

## 2014-11-07 NOTE — Procedures (Signed)
Successful US guided paracentesis from RLQ.  Yielded 745mL of clear, amber colored fluid.  No immediate complications.  Pt tolerated well.   Specimen was sent for labs.  Ascencion Dike PA-C 11/07/2014 12:06 PM

## 2014-11-07 NOTE — ED Provider Notes (Signed)
CSN: [id number]     Arrival date & time 11/07/14  0615 History   First MD Initiated Contact with Patient 11/07/14 0631     No chief complaint on file.    (Consider location/radiation/quality/duration/timing/severity/associated sxs/prior Treatment) HPI Comments: [name] is a 49 y.o. female with a PMHx of HepC, pancreatitis, GERD, alcohol dependency, chronic constipation, gallstones, cocaine use, anxiety/depression, hemochromatosis, pancytopenia, AUB s/p hysterectomy, chronic abd pain, cirrhosis, DM2, and PSHx of appendectomy and 2 c-sections, who presents to the ED with complaints of abdominal pain 4 days. She reports the pain is 10/10 constant generalized sharp pain radiating to the back worse with laying on her side and movement, and unrelieved with ibuprofen. Associated symptoms include nausea, one episode of nonbloody nonbilious emesis yesterday, increased urinary frequency, abdominal distention, and constipation 4 days. She attempted mag citrate yesterday with no relief. She denies any fevers, chills, chest pain, shortness breath, melena, hematochezia, hematemesis, obstipation, rectal pain, diarrhea, dysuria, hematuria, vaginal bleeding or discharge, numbness, tingling, weakness, recent travel, sick contacts, antibiotic use, suspicious food intake, or alcohol use. She admits to frequent NSAID use, as well as having 4 prior abdominal surgeries.  Patient is a 49 y.o. female presenting with abdominal pain. The history is provided by the patient. No language interpreter was used.  Abdominal Pain Pain location:  Generalized Pain quality: sharp   Pain radiates to:  Back Pain severity:  Severe Onset quality:  Gradual Duration:  4 days Timing:  Constant Progression:  Unchanged Chronicity:  New Context: not alcohol use, not recent illness, not recent travel, not sick contacts and not suspicious food intake   Relieved by:  Nothing Worsened by:  Movement and position changes Ineffective  treatments:  NSAIDs Associated symptoms: constipation, nausea and vomiting (x1 yesterday)   Associated symptoms: no chest pain, no chills, no diarrhea, no dysuria, no fever, no flatus, no hematemesis, no hematochezia, no hematuria, no melena, no shortness of breath, no vaginal bleeding and no vaginal discharge   Risk factors: multiple surgeries and NSAID use   Risk factors: no alcohol abuse     Past Medical History  Diagnosis Date  . Impaired glucose tolerance 11/07/2010  . Hepatitis C approx dx 2005    no tx to date  . Pancreatitis     HX of  . Alcohol dependency     at least [facility] admissions 2007- 09/2013 for detox.   [facility] Kitchen GERD (gastroesophageal reflux disease)   . Chronic constipation   . Gallstones   . Drug dependency     Hx of cocaine use  . Anxiety and depression 08/2011  . Rheumatoid factor positive   . Hemochromatosis     last phlebotomy tx ~ 2012. liver bx 2006  . Pancytopenia   . Abnormal vaginal bleeding     uterine fibroid  . Gallstone 2006  . Chronic abdominal pain 2011  . Cirrhosis 11/09/2010  . Type II or unspecified type diabetes mellitus without mention of complication, uncontrolled 09/17/2011  . Major depression 11/2012, 09/2013    [facility] admissions for this, suicide attempt by OD Ambien (2014), Prozac (2015),ETOHism   . Anxiety    Past Surgical History  Procedure Laterality Date  . Appendectomy    . Cesarean section  x 2  . Foot surgery  2013  . Abdominal hysterectomy  12/17/2011    Procedure: HYSTERECTOMY ABDOMINAL;  Surgeon: Gus Height, MD;  Location: [facility];  Service: Gynecology;  Laterality: N/A;  . Salpingoophorectomy  12/17/2011  Procedure: SALPINGO OOPHERECTOMY;  Surgeon: Gus Height, MD;  Location: [facility];  Service: Gynecology;  Laterality: Bilateral;  . Esophagogastroduodenoscopy N/A 12/10/2013    Procedure: ESOPHAGOGASTRODUODENOSCOPY (EGD);  Surgeon: Ladene Artist, MD;  Location: [facility];  Service: Endoscopy;  Laterality: N/A;  . Percutaneous liver  biopsy  2011  . Colonoscopy N/A 02/18/2014    Procedure: COLONOSCOPY;  Surgeon: Gatha Mayer, MD;  Location: [facility];  Service: Endoscopy;  Laterality: N/A;   Family History  Problem Relation Age of Onset  . Cancer Mother     ovarian  . Diabetes Father    History  Substance Use Topics  . Smoking status: Never Smoker   . Smokeless tobacco: Never Used  . Alcohol Use: Yes     Comment: one pint vodka daily years   OB History    Gravida Para Term Preterm AB TAB SAB Ectopic Multiple Living   2 2 2       3      Review of Systems  Constitutional: Negative for fever and chills.  Respiratory: Negative for shortness of breath.   Cardiovascular: Negative for chest pain.  Gastrointestinal: Positive for nausea, vomiting (x1 yesterday), abdominal pain, constipation and abdominal distention. Negative for diarrhea, blood in stool, melena, hematochezia, anal bleeding, rectal pain, flatus and hematemesis.  Genitourinary: Positive for frequency. Negative for dysuria, hematuria, vaginal bleeding and vaginal discharge.  Musculoskeletal: Positive for back pain (from abdomen). Negative for myalgias and arthralgias.  Skin: Negative for color change.  Allergic/Immunologic: Positive for immunocompromised state (Hep C).  Neurological: Negative for weakness and numbness.  Psychiatric/Behavioral: Negative for confusion.   10 Systems reviewed and are negative for acute change except as noted in the HPI.    Allergies  Betadine  Home Medications   Prior to Admission medications   Medication Sig Start Date End Date Taking? Authorizing Provider  diphenhydrAMINE (BENADRYL) 25 MG tablet Take 50 mg by mouth every 6 (six) hours as needed for sleep.    Historical Provider, MD  esomeprazole (NEXIUM) 40 MG capsule Take 40 mg by mouth daily as needed (heartburn).    Historical Provider, MD  ibuprofen (ADVIL,MOTRIN) 200 MG tablet Take 400 mg by mouth every 6 (six) hours as needed for moderate pain.     Historical Provider, MD  insulin aspart (NOVOLOG) 100 UNIT/ML injection Inject 3 Units into the skin 3 (three) times daily before meals. 07/26/14   Belkys A Regalado, MD  insulin glargine (LANTUS) 100 UNIT/ML injection Inject 0.15 mLs (15 Units total) into the skin at bedtime. 07/26/14   Belkys A Regalado, MD  lactulose (CHRONULAC) 10 GM/15ML solution Take 45 mLs (30 g total) by mouth 2 (two) times daily. 07/26/14   Belkys A Regalado, MD  metFORMIN (GLUCOPHAGE) 500 MG tablet TAKE 1 TABLET (500 MG TOTAL) BY MOUTH 2 (TWO) TIMES DAILY WITH A MEAL. 10/25/14   Biagio Borg, MD  metFORMIN (GLUCOPHAGE) 500 MG tablet TAKE 1 TABLET (500 MG TOTAL) BY MOUTH 2 (TWO) TIMES DAILY WITH A MEAL. 10/26/14   Biagio Borg, MD  methocarbamol (ROBAXIN) 500 MG tablet Take 1 tablet (500 mg total) by mouth 3 (three) times daily. 07/26/14   Belkys A Regalado, MD  Multiple Vitamin (MULTIVITAMIN WITH MINERALS) TABS tablet Take 1 tablet by mouth daily.     Historical Provider, MD  oxyCODONE (OXY IR/ROXICODONE) 5 MG immediate release tablet Take 1-2 tablets (5-10 mg total) by mouth every 6 (six) hours as needed for moderate pain. 07/26/14  Belkys A Regalado, MD  sertraline (ZOLOFT) 100 MG tablet Take 150 mg by mouth daily.    Historical Provider, MD  spironolactone (ALDACTONE) 25 MG tablet Take 0.5 tablets (12.5 mg total) by mouth daily. 07/26/14   Belkys A Regalado, MD  tetrahydrozoline (VISINE) 0.05 % ophthalmic solution Place 1 drop into both eyes every morning.    Historical Provider, MD   BP 127/92 mmHg  Pulse 112  Temp(Src) 98.1 F (36.7 C) (Oral)  Resp 19  Ht 5\' 2"  (1.575 m)  Wt 116 lb (52.617 kg)  BMI 21.21 kg/m2  SpO2 97%  LMP 11/01/2011 Physical Exam  Constitutional: She is oriented to person, place, and time. She appears well-developed and well-nourished.  Non-toxic appearance. She appears distressed (uncomfortable).  Afebrile, nontoxic, mildly tachycardic and appears somewhat uncomfortable  HENT:  Head: Normocephalic and  atraumatic.  Mouth/Throat: Oropharynx is clear and moist. Mucous membranes are dry.  Dry mucous membranes  Eyes: Conjunctivae and EOM are normal. Right eye exhibits no discharge. Left eye exhibits no discharge.  Neck: Normal range of motion. Neck supple.  Cardiovascular: Regular rhythm, normal heart sounds and intact distal pulses.  Tachycardia present.  Exam reveals no gallop and no friction rub.   No murmur heard. Mildly tachycardic, nl s1/s2, no m/r/g, reg rhythm  Pulmonary/Chest: Effort normal and breath sounds normal. No respiratory distress. She has no decreased breath sounds. She has no wheezes. She has no rhonchi. She has no rales.  Abdominal: Soft. Normal appearance. She exhibits distension. She exhibits no fluid wave. Bowel sounds are decreased. There is generalized tenderness. There is guarding and CVA tenderness. There is no rigidity, no rebound, no tenderness at McBurney's point and negative Murphy's sign.  Soft, mildly distended, +BS throughout although diminished in LUQ, some voluntary guarding without rebound or rigidity, neg murphy's, mild b/l CVA TTP   Musculoskeletal: Normal range of motion.  Neurological: She is alert and oriented to person, place, and time. She has normal strength. No sensory deficit.  Skin: Skin is warm, dry and intact. No rash noted.  Psychiatric: She has a normal mood and affect.  Nursing note and vitals reviewed.   ED Course  Procedures (including critical care time) Labs Review Labs Reviewed  LIPASE, BLOOD - Abnormal; Notable for the following:    Lipase 57 (*)    All other components within normal limits  COMPREHENSIVE METABOLIC PANEL - Abnormal; Notable for the following:    Sodium 131 (*)    Chloride 99 (*)    Glucose, Bld 409 (*)    Total Protein 8.5 (*)    Albumin 3.4 (*)    AST 119 (*)    ALT 97 (*)    Alkaline Phosphatase 134 (*)    Total Bilirubin 3.4 (*)    All other components within normal limits  URINALYSIS, ROUTINE W REFLEX  MICROSCOPIC (NOT AT [facility]) - Abnormal; Notable for the following:    Color, Urine AMBER (*)    Specific Gravity, Urine 1.041 (*)    Glucose, UA >1000 (*)    Ketones, ur 15 (*)    All other components within normal limits  CBC WITH DIFFERENTIAL/PLATELET - Abnormal; Notable for the following:    WBC 3.2 (*)    Hemoglobin 15.5 (*)    MCV 106.3 (*)    MCH 36.0 (*)    Platelets 68 (*)    All other components within normal limits  CBG MONITORING, ED - Abnormal; Notable for the following:    Glucose-Capillary  373 (*)    All other components within normal limits  URINE MICROSCOPIC-ADD ON  I-STAT TROPOININ, ED  I-STAT CG4 LACTIC ACID, ED    Imaging Review Ct Abdomen Pelvis Wo Contrast  11/07/2014   CLINICAL DATA:  Generalized abdominal pain and distention. Previously documented hepatic cirrhosis.  EXAM: CT ABDOMEN AND PELVIS WITHOUT CONTRAST  TECHNIQUE: Multidetector CT imaging of the abdomen and pelvis was performed following the standard protocol without IV contrast. Oral contrast was administered.  COMPARISON:  July 20, 2014  FINDINGS: There is mild bibasilar lung atelectasis. Lung bases are otherwise clear. There is a small hiatal hernia.  The liver again has a diffusely nodular opacity with enlargement of the caudate lobe consistent with cirrhosis. No focal liver lesions are identified on this noncontrast enhanced study. There is again noted cholelithiasis. The gallbladder wall is mildly thickened which may be due to nearby ascites, a finding also present on the previous study. There is ascites surrounding the liver and spleen. Ascites is also noted, moderate, lower in the abdomen and in the pelvis. There is no biliary duct dilatation. There are stable mildly prominent periportal lymph nodes, probably due to the underlying cirrhosis, a stable finding.  The spleen remains prominent measuring 15.6 x 12.6 x 5.2 cm, essentially stable from prior study. Perisplenic varices remain. No focal splenic  lesions are identified on this noncontrast enhanced study.  Pancreas and adrenals appear within normal limits. Kidneys bilaterally show no mass or hydronephrosis on either side. There is no renal or ureteral calculus on either side.  In the pelvis, urinary bladder is midline with normal wall thickness. There is no pelvic mass appreciable. The uterus and appendix are absent.  There is no bowel obstruction. There is no appreciable free air or portal venous air. There is no abscess apparent. No new lymph node prominence is identified compared to the prior study. There is no abdominal aortic aneurysm. There are no blastic or lytic bone lesions.  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 There is moderate ascites. There is cholelithiasis. Mild gallbladder wall thickening may well be due to the adjacent ascites.  No bowel obstruction apparent. No abscess. Uterus and appendix absent. No pancreatic lesions are appreciated on this study. Stable small hiatal hernia.   Electronically Signed   By: Lowella Grip III M.D.   On: 11/07/2014 09:02   Dg Abd Acute W/chest  11/07/2014   CLINICAL DATA:  Two week history abdominal pain and bloating. History of cirrhosis and pancreatitis  EXAM: DG ABDOMEN ACUTE W/ 1V CHEST  COMPARISON:  Chest radiograph February 14, 2014; abdomen radiographs July 23, 2014  FINDINGS: PA chest: There is no edema or consolidation. The heart size and pulmonary vascularity are normal. No adenopathy.  Supine and upright abdomen: There is diffuse stool throughout much of the colon. There is no bowel dilatation or air-fluid level suggesting obstruction. No free air. There are apparent phleboliths in the pelvis.  IMPRESSION: Diffuse stool throughout much of colon. No obstruction or free air seen. No lung edema or consolidation.   Electronically Signed   By: Lowella Grip III M.D.   On: 11/07/2014 07:23     EKG Interpretation   Date/Time:  Monday November 07 2014  07:01:43 EDT Ventricular Rate:  97 PR Interval:  139 QRS Duration: 80 QT Interval:  371 QTC Calculation: 471 R Axis:   51 Text Interpretation:  Sinus rhythm Confirmed by DOCHERTY  MD, MEGAN ([id number])  on 11/07/2014 7:08:00 AM  MDM   Final diagnoses:  Abdominal pain  Non-intractable vomiting with nausea, vomiting of unspecified type  Constipation, unspecified constipation type  Calculus of gallbladder without cholecystitis without obstruction  Elevated bilirubin  Elevated transaminase level  Thrombocytopenia  Cirrhosis of liver with ascites, unspecified hepatic cirrhosis type    49 y.o. female here with generalized abd pain, n/v, and constipation x4 days. On exam, abd distension with generalized tenderness, diminished bowel sounds in LUQ, mild b/l CVA tenderness. Will obtain labs, u/a, trop, EKG, acute abd series, and CT abd. If AAS reveals obstruction or perf, will cancel CT. Will give pain meds and nausea meds, as well as fluids. Will reassess shortly.   10:14 AM Lactic acid WNL. Trop neg. EKG unremarkable. Lipase mildly elevated. CMP with mildly low Na which corrects for glucose being 409, no anion gap. AST/ALT elevated at baseline, but Bili mildly elevated at 3.4, more than her baseline. CBC with chronic thrombocytopenia, no leukocytosis. U/A with mild ketones but otherwise unremarkable. Acute abd series with stool but otherwise negative. CT abd/pelvis with cirrhosis and moderate ascites, cholelithiasis with GB thickening due to adjacent ascites. Bedside U/S by Dr. Tawnya Crook without obvious pocket of fluid to tap. Will give rocephin for SBP and more pain meds, and admit.   10:50 AM Dr. Wynelle Cleveland of [facility] returning page. Would like to hold rocephin until U/S guided paracentesis is performed. She will admit pt, please see her note for further documentation of care. Holding orders placed.  BP 109/72 mmHg  Pulse 78  Temp(Src) 98.1 F (36.7 C) (Oral)  Resp 16  Ht 5\' 2"  (1.575 m)  Wt  116 lb (52.617 kg)  BMI 21.21 kg/m2  SpO2 96%  LMP 11/01/2011  Meds ordered this encounter  Medications  . sodium chloride 0.9 % bolus 1,000 mL    Sig:   . morphine 4 MG/ML injection 4 mg    Sig:   . ondansetron (ZOFRAN) injection 4 mg    Sig:   . iohexol (OMNIPAQUE) 300 MG/ML solution 50 mL    Sig:   . morphine 4 MG/ML injection 4 mg    Sig:   . HYDROmorphone (DILAUDID) injection 1 mg    Sig:   . DISCONTD: cefTRIAXone (ROCEPHIN) 1 g in dextrose 5 % 50 mL IVPB    Sig:   . sodium chloride 0.9 % bolus 1,000 mL    Sig:      Swan Zayed Camprubi-Soms, PA-C 11/07/14 1051  Ernestina Patches, MD 11/07/14 1559

## 2014-11-07 NOTE — ED Notes (Signed)
Patient not in room

## 2014-11-07 NOTE — Progress Notes (Signed)
Inpatient Diabetes Program Recommendations  AACE/ADA: New Consensus Statement on Inpatient Glycemic Control (2013)  Target Ranges:  Prepandial:   less than 140 mg/dL      Peak postprandial:   less than 180 mg/dL (1-2 hours)      Critically ill patients:  140 - 180 mg/dL   Reason for Visit: Hyperglycemia  Diabetes history: DM2 Outpatient Diabetes medications: Lantus 15 units QHS, Novolog 5 units tidwc, Current orders for Inpatient glycemic control: Lantus 15 QHS, Novolog 5 units tidwc  49 y.o. 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   Results for [name], [name] (MRN [id number]) as of 11/07/2014 17:15  Ref. Range 11/07/2014 06:27 11/07/2014 12:27  Glucose-Capillary Latest Ref Range: 65-99 mg/dL 373 (H) 275 (H)   Uncontrolled blood sugars. Needs Novolog correction insulin and updated HgbA1C.  Consider addition of Novolog moderate tidwc and hs Check HgbA1C to assess glycemic control prior to hospitalization  Will follow. Thank you. Lorenda Peck, RD, LDN, CDE Inpatient Diabetes Coordinator [phone]

## 2014-11-07 NOTE — ED Notes (Signed)
Awake. Verbally responsive. Resp even and unlabored. No audible adventitious breath sounds noted. ABC's intact. No N/V/D reported. IV infusing NS at 57ml/hr without difficulty.

## 2014-11-07 NOTE — ED Notes (Signed)
Awake. Verbally responsive. Resp even and unlabored. No audible adventitious breath sounds noted. ABC's intact. Abd soft/nondistended but tender to palpate. BS (+) and active x4 quadrants. No N/V/D reported. IV saline lock patent and intact. 

## 2014-11-07 NOTE — ED Notes (Signed)
Patient returned to room from Ultrasound

## 2014-11-07 NOTE — ED Notes (Signed)
Patient transported to CT 

## 2014-11-08 ENCOUNTER — Other Ambulatory Visit (HOSPITAL_COMMUNITY): Payer: Self-pay

## 2014-11-08 ENCOUNTER — Inpatient Hospital Stay (HOSPITAL_COMMUNITY): Payer: No Typology Code available for payment source

## 2014-11-08 DIAGNOSIS — E871 Hypo-osmolality and hyponatremia: Secondary | ICD-10-CM

## 2014-11-08 DIAGNOSIS — R188 Other ascites: Secondary | ICD-10-CM

## 2014-11-08 LAB — CBC
HCT: 40.4 % (ref 36.0–46.0)
HEMOGLOBIN: 13.5 g/dL (ref 12.0–15.0)
MCH: 35.4 pg — ABNORMAL HIGH (ref 26.0–34.0)
MCHC: 33.4 g/dL (ref 30.0–36.0)
MCV: 106 fL — ABNORMAL HIGH (ref 78.0–100.0)
Platelets: 56 10*3/uL — ABNORMAL LOW (ref 150–400)
RBC: 3.81 MIL/uL — ABNORMAL LOW (ref 3.87–5.11)
RDW: 13.9 % (ref 11.5–15.5)
WBC: 3.3 10*3/uL — ABNORMAL LOW (ref 4.0–10.5)

## 2014-11-08 LAB — COMPREHENSIVE METABOLIC PANEL
ALT: 82 U/L — ABNORMAL HIGH (ref 14–54)
ANION GAP: 9 (ref 5–15)
AST: 112 U/L — AB (ref 15–41)
Albumin: 3 g/dL — ABNORMAL LOW (ref 3.5–5.0)
Alkaline Phosphatase: 110 U/L (ref 38–126)
BILIRUBIN TOTAL: 2.7 mg/dL — AB (ref 0.3–1.2)
BUN: 9 mg/dL (ref 6–20)
CO2: 30 mmol/L (ref 22–32)
CREATININE: 0.39 mg/dL — AB (ref 0.44–1.00)
Calcium: 8.6 mg/dL — ABNORMAL LOW (ref 8.9–10.3)
Chloride: 90 mmol/L — ABNORMAL LOW (ref 101–111)
GFR calc Af Amer: >60 mL/min (ref >60)
GFR calc non Af Amer: >60 mL/min (ref >60)
GLUCOSE: 245 mg/dL — AB (ref 65–99)
POTASSIUM: 3.4 mmol/L — AB (ref 3.5–5.1)
SODIUM: 129 mmol/L — AB (ref 135–145)
TOTAL PROTEIN: 7.1 g/dL (ref 6.5–8.1)

## 2014-11-08 LAB — PROTIME-INR
INR: 1.2 (ref 0.00–1.49)
Prothrombin Time: 15.4 seconds — ABNORMAL HIGH (ref 11.6–15.2)

## 2014-11-08 LAB — GLUCOSE, CAPILLARY
GLUCOSE-CAPILLARY: 262 mg/dL — AB (ref 65–99)
GLUCOSE-CAPILLARY: 376 mg/dL — AB (ref 65–99)
Glucose-Capillary: 243 mg/dL — ABNORMAL HIGH (ref 65–99)
Glucose-Capillary: 221 mg/dL — ABNORMAL HIGH (ref 65–99)

## 2014-11-08 MED ORDER — INSULIN GLARGINE 100 UNIT/ML ~~LOC~~ SOLN
20.0000 [IU] | Freq: Every day | SUBCUTANEOUS | Status: DC
  Administered 2014-11-08 – 2014-11-15 (×7): 20 [IU] via SUBCUTANEOUS
  Filled 2014-11-08 (×8): qty 0.2

## 2014-11-08 MED ORDER — SPIRONOLACTONE 50 MG PO TABS
50.0000 mg | ORAL_TABLET | Freq: Two times a day (BID) | ORAL | Status: DC
  Administered 2014-11-08 – 2014-11-13 (×11): 50 mg via ORAL
  Filled 2014-11-08 (×13): qty 1

## 2014-11-08 MED ORDER — BISACODYL 5 MG PO TBEC
10.0000 mg | DELAYED_RELEASE_TABLET | ORAL | Status: AC
  Administered 2014-11-08 – 2014-11-09 (×6): 10 mg via ORAL
  Filled 2014-11-08 (×5): qty 2

## 2014-11-08 NOTE — Progress Notes (Signed)
[facility] Progress Note   [name] MCN:[id number] DOB: 04-May-1965 DOA: 11/07/2014 PCP: Cathlean Cower, MD  Brief narrative: [name] is a 49 y.o. 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 This is a constant pain with no exacerbating factors. It has been worse for a few days. She has been taking about 6 Ibuprofen for it daily. She has had chills but no fever. One episode of non-bloody vomiting yesterday but after this she was able to eat without trouble. No diarrhea- actually is constipated. Abdomen was distended with ascitic fluid- she states it feels like she was having twins. She cannot recall if she took the spironolactone that was prescribed to her when discharged in May. CT did not show any pathology. Pain resolved after Dilaudid.    Subjective: Abdomen is distended again. She refused the enemas I ordered her yesterday. Advised to take them today. 2 small bowel movements yesterday evening and one this morning. Advised to cut back oral fluid intake to help to diurese her.  Assessment/Plan: Principal Problem:   Abdominal pain, acute -Was using ibuprofen at home for pain-Stop NSAIDs due to GERD and high risk for bleed with portal HTN/ thrombocytopenia/ - unfortunately will need to give PRN Narcotics for her pain- will not use long term - diffuse pain for 2 wks- likely due to abdominal distention-negative for SBP-mildly elevated in Lipase- doubtful it is pancreatitis- CT and Xray report below  (a)- has diffuse stool noted on imaging- soap suds enema or GERD every 6 hrs x 2 and refused it-had 2 small bowel movements yesterday and one this morning , ordered dulcolax orally every 6 hrs x 4 doses will order 6 more doses- xay in AM to re-assess need for more laxatives- ran out of her Lactulose a while ago- has not become encephalopathic.  (b) ascites-started  on Rocephin by ER for possible SBP which I d/c'ed the 4 she received a dose- I ordered ultrasound guided paracentesis stat to r/o SBP- fluid results were negative for SBP  - 730 cc removed during paracentesis-   -- She was prescribed Aldactone when she was last discharged 3 months ago but she cannot recall if she took it-recently she was not taking any diuretics-   - started IV Lasix and Aldactone  follow K and sodium   Hyponatremia - follow while on diuretics- may need to stop diuretics if dropping too low or if symptomatic -in this case she would need periodic paracentesis  Hypokalemia - replace    DM (diabetes mellitus), type 2 - cont Lantus and 5 u novolog with meals- sugars are quite elevated-she states they're well controlled at home-I'm not sure if she checks them regularly-will increase Lantus to 20 units daily at bedtime today   MDD (major depressive disorder) - cont Zoloft   Cirrhosis of liver - multiple reasons- hemachromatosis?, hep c and alcohol use- follows with Dr Carlean Purl- he planned on trying to get her HCV treated per note on 4/3 when she was last admitted - checked INR again and calculated child pugh which is B - bili higher than her usual- other LFTs stable-   - above treatment for ascites - stopped drinking 1 mo ago - no hepatic encephalopathy despite no Lactulose   Thrombocytopenia/ leukopenia - due to cirrhosis/ portal HTN  Appt with PCP: requested Code Status: full code Family Communication:  Disposition Plan: home when stable DVT prophylaxis: SCDs Consultants:none Procedures: 7/18- paracentesis  Antibiotics: Anti-infectives    Start     Dose/Rate Route Frequency Ordered Stop   11/07/14 1015  cefTRIAXone (ROCEPHIN) 1 g in dextrose 5 % 50 mL IVPB  Status:  Discontinued     1 g 100 mL/hr over 30 Minutes Intravenous  Once 11/07/14 1014 11/07/14 1046      Objective: Filed Weights   11/07/14 0621 11/07/14 1312  Weight: 52.617 kg (116 lb) 53.661  kg (118 lb 4.8 oz)    Intake/Output Summary (Last 24 hours) at 11/08/14 1245 Last data filed at 11/08/14 0958  Gross per 24 hour  Intake    483 ml  Output   5025 ml  Net  -4542 ml     Vitals Filed Vitals:   11/07/14 1312 11/07/14 1430 11/07/14 2118 11/08/14 0516  BP: 137/86 128/90 127/78 123/89  Pulse: 74 70 92 86  Temp: 98.2 F (36.8 C) 98.2 F (36.8 C) 98.3 F (36.8 C) 98.4 F (36.9 C)  TempSrc: Oral Oral Oral Oral  Resp: 14 18 18 18   Height: 5\' 2"  (1.575 m)     Weight: 53.661 kg (118 lb 4.8 oz)     SpO2: 100% 99% 97% 97%    Exam:  General:  Pt is alert, not in acute distress  HEENT: No icterus, No thrush, oral mucosa moist  Cardiovascular: regular rate and rhythm, S1/S2 No murmur  Respiratory: clear to auscultation bilaterally   Abdomen: Soft, +Bowel sounds,mildly tender, quite distended and tight,  no guarding  MSK: No LE edema, cyanosis or clubbing  Data Reviewed: Basic Metabolic Panel:  Recent Labs Lab 11/07/14 0644 11/08/14 0355  NA 131* 129*  K 3.8 3.4*  CL 99* 90*  CO2 23 30  GLUCOSE 409* 245*  BUN 8 9  CREATININE 0.58 0.39*  CALCIUM 9.3 8.6*   Liver Function Tests:  Recent Labs Lab 11/07/14 0644 11/08/14 0355  AST 119* 112*  ALT 97* 82*  ALKPHOS 134* 110  BILITOT 3.4* 2.7*  PROT 8.5* 7.1  ALBUMIN 3.4* 3.0*    Recent Labs Lab 11/07/14 0644  LIPASE 57*   No results for input(s): AMMONIA in the last 168 hours. CBC:  Recent Labs Lab 11/07/14 0644 11/08/14 0355  WBC 3.2* 3.3*  NEUTROABS 2.0  --   HGB 15.5* 13.5  HCT 45.7 40.4  MCV 106.3* 106.0*  PLT 68* 56*   Cardiac Enzymes: No results for input(s): CKTOTAL, CKMB, CKMBINDEX, TROPONINI in the last 168 hours. BNP (last 3 results) No results for input(s): BNP in the last 8760 hours.  ProBNP (last 3 results) No results for input(s): PROBNP in the last 8760 hours.  CBG:  Recent Labs Lab 11/07/14 1227 11/07/14 1720 11/07/14 2123 11/08/14 0801 11/08/14 1201   GLUCAP 275* 329* 359* 243* 262*    Recent Results (from the past 240 hour(s))  Culture, body fluid-bottle     Status: None (Preliminary result)   Collection Time: 11/07/14 11:40 AM  Result Value Ref Range Status   Specimen Description FLUID PERITONEAL  Final   Special Requests   Final    BOTTLES DRAWN AEROBIC AND ANAEROBIC 10CC Performed at [facility]    Culture PENDING  Incomplete   Report Status PENDING  Incomplete  Gram stain     Status: None   Collection Time: 11/07/14 11:40 AM  Result Value Ref Range Status   Specimen Description PERITONEAL  Final   Special Requests  NONE  Final   Gram Stain   Final    WBC PRESENT,BOTH PMN AND MONONUCLEAR NO ORGANISMS SEEN Gram Stain Report Called to,Read Back By and Verified With: TORRES,D @ 3154 ON 11/07/14 POTEAT,S Performed at [facility]    Report Status 11/07/2014 FINAL  Final     Studies: Ct Abdomen Pelvis Wo Contrast  11/07/2014   CLINICAL DATA:  Generalized abdominal pain and distention. Previously documented hepatic cirrhosis.  EXAM: CT ABDOMEN AND PELVIS WITHOUT CONTRAST  TECHNIQUE: Multidetector CT imaging of the abdomen and pelvis was performed following the standard protocol without IV contrast. Oral contrast was administered.  COMPARISON:  July 20, 2014  FINDINGS: There is mild bibasilar lung atelectasis. Lung bases are otherwise clear. There is a small hiatal hernia.  The liver again has a diffusely nodular opacity with enlargement of the caudate lobe consistent with cirrhosis. No focal liver lesions are identified on this noncontrast enhanced study. There is again noted cholelithiasis. The gallbladder wall is mildly thickened which may be due to nearby ascites, a finding also present on the previous study. There is ascites surrounding the liver and spleen. Ascites is also noted, moderate, lower in the abdomen and in the pelvis. There is no biliary duct dilatation. There are stable mildly prominent periportal lymph  nodes, probably due to the underlying cirrhosis, a stable finding.  The spleen remains prominent measuring 15.6 x 12.6 x 5.2 cm, essentially stable from prior study. Perisplenic varices remain. No focal splenic lesions are identified on this noncontrast enhanced study.  Pancreas and adrenals appear within normal limits. Kidneys bilaterally show no mass or hydronephrosis on either side. There is no renal or ureteral calculus on either side.  In the pelvis, urinary bladder is midline with normal wall thickness. There is no pelvic mass appreciable. The uterus and appendix are absent.  There is no bowel obstruction. There is no appreciable free air or portal venous air. There is no abscess apparent. No new lymph node prominence is identified compared to the prior study. There is no abdominal aortic aneurysm. There are no blastic or lytic bone lesions.  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 There is moderate ascites. There is cholelithiasis. Mild gallbladder wall thickening may well be due to the adjacent ascites.  No bowel obstruction apparent. No abscess. Uterus and appendix absent. No pancreatic lesions are appreciated on this study. Stable small hiatal hernia.   Electronically Signed   By: Lowella Grip III M.D.   On: 11/07/2014 09:02   US Paracentesis  11/07/2014   CLINICAL DATA:  Abdominal pain, ascites. Request diagnostic and therapeutic paracentesis.  EXAM: ULTRASOUND GUIDED PARACENTESIS  COMPARISON:  None.  PROCEDURE: An ultrasound guided paracentesis was thoroughly discussed with the patient and questions answered. The benefits, risks, alternatives and complications were also discussed. The patient understands and wishes to proceed with the procedure. Written consent was obtained.  Ultrasound was performed to localize and mark an adequate pocket of fluid in the right lower quadrant of the abdomen. The area was then prepped and draped in the normal sterile  fashion. 1% Lidocaine was used for local anesthesia. Under ultrasound guidance a 19 gauge Yueh catheter was introduced. Paracentesis was performed. The catheter was removed and a dressing applied.  COMPLICATIONS: None immediate  FINDINGS: A total of approximately 730 mL of clear, amber colored fluid was removed. A fluid sample was sent for laboratory analysis.  IMPRESSION: Successful ultrasound guided paracentesis yielding 730 mL of ascites.  Read  by: Ascencion Dike PA-C   Electronically Signed   By: Marybelle Killings M.D.   On: 11/07/2014 12:08   Dg Abd Acute W/chest  11/07/2014   CLINICAL DATA:  Two week history abdominal pain and bloating. History of cirrhosis and pancreatitis  EXAM: DG ABDOMEN ACUTE W/ 1V CHEST  COMPARISON:  Chest radiograph February 14, 2014; abdomen radiographs July 23, 2014  FINDINGS: PA chest: There is no edema or consolidation. The heart size and pulmonary vascularity are normal. No adenopathy.  Supine and upright abdomen: There is diffuse stool throughout much of the colon. There is no bowel dilatation or air-fluid level suggesting obstruction. No free air. There are apparent phleboliths in the pelvis.  IMPRESSION: Diffuse stool throughout much of colon. No obstruction or free air seen. No lung edema or consolidation.   Electronically Signed   By: Lowella Grip III M.D.   On: 11/07/2014 07:23   Dg Abd Portable 1v  11/07/2014   CLINICAL DATA:  Two weeks of abdominal pain and constipation  EXAM: PORTABLE ABDOMEN - 1 VIEW  COMPARISON:  Abdominal and pelvic CT scan of November 07, 2014  FINDINGS: There are loops of mildly distended gas-filled small bowel in the mid abdomen. There is considerable contrast in the right colon from the CT scan. The colonic stool burden is mildly increased diffusely. No free extraluminal gas collections are demonstrated. There are phleboliths in the pelvis. The bony structures are unremarkable.  IMPRESSION: Nonspecific bowel gas pattern. Moderately increased colonic  stool burden may reflect clinical constipation.   Electronically Signed   By: David  Martinique M.D.   On: 11/07/2014 13:44    Scheduled Meds:  Scheduled Meds: . bisacodyl  10 mg Oral Q4H  . furosemide  40 mg Intravenous BID  . insulin aspart  0-5 Units Subcutaneous QHS  . insulin aspart  0-9 Units Subcutaneous TID WC  . insulin aspart  5 Units Subcutaneous TID WC  . insulin glargine  20 Units Subcutaneous QHS  . multivitamin with minerals  1 tablet Oral Daily  . pantoprazole  40 mg Oral Daily  . sertraline  150 mg Oral Daily  . sodium chloride  3 mL Intravenous Q12H  . spironolactone  50 mg Oral BID   Continuous Infusions:   Time spent on care of this patient: 40 min   Kimble, MD 11/08/2014, 12:45 PM  LOS: 1 day   [facility] Office  [phone] Pager - Text Page per [email or web address] If 7PM-7AM, please contact night-coverage [email or web address]

## 2014-11-09 DIAGNOSIS — E876 Hypokalemia: Secondary | ICD-10-CM | POA: Diagnosis present

## 2014-11-09 DIAGNOSIS — K659 Peritonitis, unspecified: Secondary | ICD-10-CM

## 2014-11-09 LAB — GLUCOSE, CAPILLARY
GLUCOSE-CAPILLARY: 217 mg/dL — AB (ref 65–99)
GLUCOSE-CAPILLARY: 158 mg/dL — AB (ref 65–99)
Glucose-Capillary: 217 mg/dL — ABNORMAL HIGH (ref 65–99)
Glucose-Capillary: 146 mg/dL — ABNORMAL HIGH (ref 65–99)

## 2014-11-09 LAB — BASIC METABOLIC PANEL
Anion gap: 8 (ref 5–15)
BUN: 8 mg/dL (ref 6–20)
CO2: 34 mmol/L — ABNORMAL HIGH (ref 22–32)
Calcium: 8.9 mg/dL (ref 8.9–10.3)
Chloride: 90 mmol/L — ABNORMAL LOW (ref 101–111)
Creatinine, Ser: 0.43 mg/dL — ABNORMAL LOW (ref 0.44–1.00)
GFR calc non Af Amer: >60 mL/min (ref >60)
Glucose, Bld: 216 mg/dL — ABNORMAL HIGH (ref 65–99)
Potassium: 3.2 mmol/L — ABNORMAL LOW (ref 3.5–5.1)
Sodium: 132 mmol/L — ABNORMAL LOW (ref 135–145)

## 2014-11-09 MED ORDER — POTASSIUM CHLORIDE CRYS ER 20 MEQ PO TBCR
40.0000 meq | EXTENDED_RELEASE_TABLET | Freq: Once | ORAL | Status: AC
  Administered 2014-11-09: 40 meq via ORAL
  Filled 2014-11-09: qty 2

## 2014-11-09 MED ORDER — PIPERACILLIN-TAZOBACTAM 3.375 G IVPB 30 MIN
3.3750 g | Freq: Three times a day (TID) | INTRAVENOUS | Status: DC
  Administered 2014-11-09 – 2014-11-13 (×11): 3.375 g via INTRAVENOUS
  Filled 2014-11-09 (×11): qty 50

## 2014-11-09 MED ORDER — LACTULOSE 10 GM/15ML PO SOLN
20.0000 g | Freq: Two times a day (BID) | ORAL | Status: AC
  Administered 2014-11-09 – 2014-11-11 (×4): 20 g via ORAL
  Filled 2014-11-09 (×4): qty 30

## 2014-11-09 MED ORDER — FUROSEMIDE 40 MG PO TABS
40.0000 mg | ORAL_TABLET | Freq: Every day | ORAL | Status: DC
  Administered 2014-11-10 – 2014-11-15 (×6): 40 mg via ORAL
  Filled 2014-11-09 (×8): qty 1

## 2014-11-09 NOTE — Progress Notes (Signed)
[facility] PROGRESS NOTE  [name] ALP:[id number] DOB: [birth year] DOA: 11/07/2014 PCP: Meredith Cower, MD  Brief narrative: [name] is a 49 y.o. 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 This is a constant pain with no exacerbating factors. It has been worse for a few days. She has been taking about 6 Ibuprofen for it daily. She has had chills but no fever. One episode of non-bloody vomiting yesterday but after this she was able to eat without trouble. No diarrhea- actually is constipated. Abdomen was distended with ascitic fluid- she states it feels like she was having twins. She cannot recall if she took the spironolactone that was prescribed to her when discharged in May. CT did not show any acute pathology. Pain resolved after Dilaudid. Patient seems to have peritonitis as she still remains quite tender and she reports that antibody seem to help some. She admits to history of chills prior to admission. We will therefore put she is on Zosyn and reassess. Otherwise she reports that she is still constipated. Will add lactulose. Will continue to replenish potassium as needed and change Lasix to oral. Plan Abdominal pain, acute/Cirrhosis of liver/Constipation/Peritonitis with effusion/Hypokalemia  Zosyn  Lactulose  Replenish potassium DM (diabetes mellitus), type 2/MDD (major depressive disorder)  No acute changes  Continue current medications Code Status: Full Code Family Communication: Patient at bedside. Disposition Plan: Eventually home Consultants:  None Procedures:  Paracentesis Antibiotics:  Zosyn 11/09/2014>  HPI/Subjective: Complains of abdominal and low back pain  Objective: Filed Vitals:   11/09/14 1443  BP: 103/70  Pulse: 84  Temp: 98.2 F (36.8 C)  Resp: 18    Intake/Output Summary (Last 24 hours) at 11/09/14  1816 Last data filed at 11/09/14 1744  Gross per 24 hour  Intake   1062 ml  Output   1850 ml  Net   -788 ml   Filed Weights   11/07/14 0621 11/07/14 1312  Weight: 52.617 kg (116 lb) 53.661 kg (118 lb 4.8 oz)    Exam:   General:  Comfortable at rest.  Cardiovascular: S1-S2 normal. No murmurs. Pulse regular.  Respiratory: Good air entry bilaterally. No rhonchi or rales.  Abdomen: Soft diffuse tenderness to palpation.  Musculoskeletal: No pedal edema   Neurological: Intact  Data Reviewed: Basic Metabolic Panel:  Recent Labs Lab 11/07/14 0644 11/08/14 0355 11/09/14 0340  NA 131* 129* 132*  K 3.8 3.4* 3.2*  CL 99* 90* 90*  CO2 23 30 34*  GLUCOSE 409* 245* 216*  BUN 8 9 8   CREATININE 0.58 0.39* 0.43*  CALCIUM 9.3 8.6* 8.9   Liver Function Tests:  Recent Labs Lab 11/07/14 0644 11/08/14 0355  AST 119* 112*  ALT 97* 82*  ALKPHOS 134* 110  BILITOT 3.4* 2.7*  PROT 8.5* 7.1  ALBUMIN 3.4* 3.0*    Recent Labs Lab 11/07/14 0644  LIPASE 57*   No results for input(s): AMMONIA in the last 168 hours. CBC:  Recent Labs Lab 11/07/14 0644 11/08/14 0355  WBC 3.2* 3.3*  NEUTROABS 2.0  --   HGB 15.5* 13.5  HCT 45.7 40.4  MCV 106.3* 106.0*  PLT 68* 56*   Cardiac Enzymes: No results for input(s): CKTOTAL, CKMB, CKMBINDEX, TROPONINI in the last 168 hours. BNP (last 3 results) No results for input(s): BNP in the last 8760 hours.  ProBNP (last 3 results) No results  for input(s): PROBNP in the last 8760 hours.  CBG:  Recent Labs Lab 11/08/14 1738 11/08/14 2127 11/09/14 0755 11/09/14 1208 11/09/14 1638  GLUCAP 221* 376* 217* 158* 217*    Recent Results (from the past 240 hour(s))  Culture, body fluid-bottle     Status: None (Preliminary result)   Collection Time: 11/07/14 11:40 AM  Result Value Ref Range Status   Specimen Description FLUID PERITONEAL  Final   Special Requests BOTTLES DRAWN AEROBIC AND ANAEROBIC 10CC  Final   Culture   Final     NO GROWTH 2 DAYS Performed at [facility]    Report Status PENDING  Incomplete  Gram stain     Status: None   Collection Time: 11/07/14 11:40 AM  Result Value Ref Range Status   Specimen Description PERITONEAL  Final   Special Requests NONE  Final   Gram Stain   Final    WBC PRESENT,BOTH PMN AND MONONUCLEAR NO ORGANISMS SEEN Gram Stain Report Called to,Read Back By and Verified With: [name],D @ 0109 ON 11/07/14 [name],S Performed at [facility]    Report Status 11/07/2014 FINAL  Final     Studies: Dg Abd Portable 1v  11/09/2014   CLINICAL DATA:  49 year old female with constipation.  EXAM: PORTABLE ABDOMEN - 1 VIEW  COMPARISON:  Radiograph dated 11/07/2014  FINDINGS: There is moderate stool throughout the colon. No evidence of bowel obstruction. No radiopaque calculi. No free air identified. Degenerative changes of spine. No acute fracture.  IMPRESSION: Constipation.  No evidence of bowel obstruction.   Electronically Signed   By: [name] M.D.   On: 11/09/2014 00:53    Scheduled Meds: . furosemide  40 mg Oral Daily  . insulin aspart  0-5 Units Subcutaneous QHS  . insulin aspart  0-9 Units Subcutaneous TID WC  . insulin aspart  5 Units Subcutaneous TID WC  . insulin glargine  20 Units Subcutaneous QHS  . lactulose  20 g Oral BID  . multivitamin with minerals  1 tablet Oral Daily  . pantoprazole  40 mg Oral Daily  . piperacillin-tazobactam  3.375 g Intravenous 3 times per day  . potassium chloride  40 mEq Oral Once  . sertraline  150 mg Oral Daily  . sodium chloride  3 mL Intravenous Q12H  . spironolactone  50 mg Oral BID   Continuous Infusions:    Time spent: 25 minutes    [name]  [facility] Pager [phone]. If 7PM-7AM, please contact night-coverage at [email or web address], password [facility] 11/09/2014, 6:16 PM  LOS: 2 days

## 2014-11-10 LAB — COMPREHENSIVE METABOLIC PANEL
ALBUMIN: 3.2 g/dL — AB (ref 3.5–5.0)
ALK PHOS: 106 U/L (ref 38–126)
ALT: 123 U/L — ABNORMAL HIGH (ref 14–54)
AST: 184 U/L — ABNORMAL HIGH (ref 15–41)
Anion gap: 6 (ref 5–15)
BUN: 8 mg/dL (ref 6–20)
CALCIUM: 9.7 mg/dL (ref 8.9–10.3)
CHLORIDE: 90 mmol/L — AB (ref 101–111)
CO2: 38 mmol/L — ABNORMAL HIGH (ref 22–32)
Creatinine, Ser: 0.51 mg/dL (ref 0.44–1.00)
Glucose, Bld: 107 mg/dL — ABNORMAL HIGH (ref 65–99)
POTASSIUM: 4.4 mmol/L (ref 3.5–5.1)
Sodium: 134 mmol/L — ABNORMAL LOW (ref 135–145)
Total Bilirubin: 2.4 mg/dL — ABNORMAL HIGH (ref 0.3–1.2)
Total Protein: 7.6 g/dL (ref 6.5–8.1)

## 2014-11-10 LAB — CBC
HCT: 43.6 % (ref 36.0–46.0)
Hemoglobin: 14.6 g/dL (ref 12.0–15.0)
MCH: 35.4 pg — ABNORMAL HIGH (ref 26.0–34.0)
MCHC: 33.5 g/dL (ref 30.0–36.0)
MCV: 105.8 fL — ABNORMAL HIGH (ref 78.0–100.0)
PLATELETS: 71 10*3/uL — AB (ref 150–400)
RBC: 4.12 MIL/uL (ref 3.87–5.11)
RDW: 13.8 % (ref 11.5–15.5)
WBC: 2.4 10*3/uL — AB (ref 4.0–10.5)

## 2014-11-10 LAB — GLUCOSE, CAPILLARY
GLUCOSE-CAPILLARY: 73 mg/dL (ref 65–99)
GLUCOSE-CAPILLARY: 334 mg/dL — AB (ref 65–99)
GLUCOSE-CAPILLARY: 323 mg/dL — AB (ref 65–99)
Glucose-Capillary: 52 mg/dL — ABNORMAL LOW (ref 65–99)
Glucose-Capillary: 85 mg/dL (ref 65–99)

## 2014-11-10 LAB — PROTIME-INR
INR: 1.32 (ref 0.00–1.49)
PROTHROMBIN TIME: 16.5 s — AB (ref 11.6–15.2)

## 2014-11-10 LAB — MAGNESIUM: MAGNESIUM: 1.5 mg/dL — AB (ref 1.7–2.4)

## 2014-11-10 NOTE — Progress Notes (Signed)
[facility] PROGRESS NOTE  [name] QMG:[id number] DOB: 1965-09-26 DOA: 11/07/2014 PCP: Cathlean Cower, MD  Brief narrative: 11/09/14: [name] is a 49 y.o. 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 This is a constant pain with no exacerbating factors. It has been worse for a few days. She has been taking about 6 Ibuprofen for it daily. She has had chills but no fever. One episode of non-bloody vomiting yesterday but after this she was able to eat without trouble. No diarrhea- actually is constipated. Abdomen was distended with ascitic fluid- she states it feels like she was having twins. She cannot recall if she took the spironolactone that was prescribed to her when discharged in May. CT did not show any acute pathology. Pain resolved after Dilaudid. Patient seems to have peritonitis as she still remains quite tender and she reports that antibody seem to help some. She admits to history of chills prior to admission. We will therefore put she is on Zosyn and reassess. Otherwise she reports that she is still constipated. Will add lactulose. Will continue to replenish potassium as needed and change Lasix to oral. 11/10/14: Reports that pain is better and that she had a bowel movement. Antibiotic seem to be helping. Will continue antibiotic for now. Plan Abdominal pain, acute/Cirrhosis of liver/Constipation/Peritonitis with effusion/Hypokalemia  Continue Zosyn  Lactulose  Replenish potassium as needed DM (diabetes mellitus), type 2/MDD (major depressive disorder)  No acute changes  Continue current medications Code Status: Full Code Family Communication: Patient at bedside. Disposition Plan: Eventually home Consultants:  None Procedures:  Paracentesis Antibiotics:  Zosyn 11/09/2014>  HPI/Subjective: Says back pain is better. Objective: Filed  Vitals:   11/10/14 1311  BP: 96/65  Pulse: 73  Temp: 98.1 F (36.7 C)  Resp: 16    Intake/Output Summary (Last 24 hours) at 11/10/14 1912 Last data filed at 11/10/14 1547  Gross per 24 hour  Intake    480 ml  Output   3800 ml  Net  -3320 ml   Filed Weights   11/07/14 0621 11/07/14 1312  Weight: 52.617 kg (116 lb) 53.661 kg (118 lb 4.8 oz)    Exam:   General:  Comfortable at rest.  Cardiovascular: S1-S2 normal. No murmurs. Pulse regular.  Respiratory: Good air entry bilaterally. No rhonchi or rales.  Abdomen: Soft and  Slightly tender to palpation. Abdominal distention.  Musculoskeletal: No pedal edema   Neurological: Intact  Data Reviewed: Basic Metabolic Panel:  Recent Labs Lab 11/07/14 0644 11/08/14 0355 11/09/14 0340 11/10/14 0418  NA 131* 129* 132* 134*  K 3.8 3.4* 3.2* 4.4  CL 99* 90* 90* 90*  CO2 23 30 34* 38*  GLUCOSE 409* 245* 216* 107*  BUN 8 9 8 8   CREATININE 0.58 0.39* 0.43* 0.51  CALCIUM 9.3 8.6* 8.9 9.7  MG  --   --   --  1.5*   Liver Function Tests:  Recent Labs Lab 11/07/14 0644 11/08/14 0355 11/10/14 0418  AST 119* 112* 184*  ALT 97* 82* 123*  ALKPHOS 134* 110 106  BILITOT 3.4* 2.7* 2.4*  PROT 8.5* 7.1 7.6  ALBUMIN 3.4* 3.0* 3.2*    Recent Labs Lab 11/07/14 0644  LIPASE 57*   No results for input(s): AMMONIA in the last 168 hours. CBC:  Recent Labs Lab 11/07/14 0644 11/08/14 0355 11/10/14 0418  WBC 3.2* 3.3*  2.4*  NEUTROABS 2.0  --   --   HGB 15.5* 13.5 14.6  HCT 45.7 40.4 43.6  MCV 106.3* 106.0* 105.8*  PLT 68* 56* 71*   Cardiac Enzymes: No results for input(s): CKTOTAL, CKMB, CKMBINDEX, TROPONINI in the last 168 hours. BNP (last 3 results) No results for input(s): BNP in the last 8760 hours.  ProBNP (last 3 results) No results for input(s): PROBNP in the last 8760 hours.  CBG:  Recent Labs Lab 11/09/14 2034 11/10/14 0807 11/10/14 1200 11/10/14 1631 11/10/14 1658  GLUCAP 146* 73 334* 52* 85     Recent Results (from the past 240 hour(s))  Culture, body fluid-bottle     Status: None (Preliminary result)   Collection Time: 11/07/14 11:40 AM  Result Value Ref Range Status   Specimen Description FLUID PERITONEAL  Final   Special Requests BOTTLES DRAWN AEROBIC AND ANAEROBIC 10CC  Final   Culture   Final    NO GROWTH 3 DAYS Performed at [facility]    Report Status PENDING  Incomplete  Gram stain     Status: None   Collection Time: 11/07/14 11:40 AM  Result Value Ref Range Status   Specimen Description PERITONEAL  Final   Special Requests NONE  Final   Gram Stain   Final    WBC PRESENT,BOTH PMN AND MONONUCLEAR NO ORGANISMS SEEN Gram Stain Report Called to,Read Back By and Verified With: TORRES,D @ 0175 ON 11/07/14 POTEAT,S Performed at [facility]    Report Status 11/07/2014 FINAL  Final     Studies: Dg Abd Portable 1v  11/09/2014   CLINICAL DATA:  49 year old female with constipation.  EXAM: PORTABLE ABDOMEN - 1 VIEW  COMPARISON:  Radiograph dated 11/07/2014  FINDINGS: There is moderate stool throughout the colon. No evidence of bowel obstruction. No radiopaque calculi. No free air identified. Degenerative changes of spine. No acute fracture.  IMPRESSION: Constipation.  No evidence of bowel obstruction.   Electronically Signed   By: Anner Crete M.D.   On: 11/09/2014 00:53    Scheduled Meds: . furosemide  40 mg Oral Daily  . insulin aspart  0-5 Units Subcutaneous QHS  . insulin aspart  0-9 Units Subcutaneous TID WC  . insulin aspart  5 Units Subcutaneous TID WC  . insulin glargine  20 Units Subcutaneous QHS  . lactulose  20 g Oral BID  . multivitamin with minerals  1 tablet Oral Daily  . pantoprazole  40 mg Oral Daily  . piperacillin-tazobactam  3.375 g Intravenous 3 times per day  . sertraline  150 mg Oral Daily  . sodium chloride  3 mL Intravenous Q12H  . spironolactone  50 mg Oral BID   Continuous Infusions:    Time spent: 25  minutes    Edgardo Petrenko  [facility] Pager [phone]. If 7PM-7AM, please contact night-coverage at [email or web address], password [facility] 11/10/2014, 7:12 PM  LOS: 3 days

## 2014-11-11 ENCOUNTER — Inpatient Hospital Stay (HOSPITAL_COMMUNITY): Payer: No Typology Code available for payment source

## 2014-11-11 LAB — GLUCOSE, CAPILLARY
GLUCOSE-CAPILLARY: 272 mg/dL — AB (ref 65–99)
Glucose-Capillary: 184 mg/dL — ABNORMAL HIGH (ref 65–99)
Glucose-Capillary: 162 mg/dL — ABNORMAL HIGH (ref 65–99)
Glucose-Capillary: 85 mg/dL (ref 65–99)

## 2014-11-11 LAB — COMPREHENSIVE METABOLIC PANEL
ALBUMIN: 2.8 g/dL — AB (ref 3.5–5.0)
ALK PHOS: 103 U/L (ref 38–126)
ALT: 108 U/L — AB (ref 14–54)
AST: 134 U/L — ABNORMAL HIGH (ref 15–41)
Anion gap: 7 (ref 5–15)
BUN: 10 mg/dL (ref 6–20)
CO2: 36 mmol/L — ABNORMAL HIGH (ref 22–32)
CREATININE: 0.6 mg/dL (ref 0.44–1.00)
Calcium: 8.6 mg/dL — ABNORMAL LOW (ref 8.9–10.3)
Chloride: 93 mmol/L — ABNORMAL LOW (ref 101–111)
GFR calc Af Amer: >60 mL/min (ref >60)
GFR calc non Af Amer: >60 mL/min (ref >60)
GLUCOSE: 270 mg/dL — AB (ref 65–99)
POTASSIUM: 3.6 mmol/L (ref 3.5–5.1)
Sodium: 136 mmol/L (ref 135–145)
Total Bilirubin: 1.9 mg/dL — ABNORMAL HIGH (ref 0.3–1.2)
Total Protein: 6.9 g/dL (ref 6.5–8.1)

## 2014-11-11 LAB — CBC
HEMATOCRIT: 39.9 % (ref 36.0–46.0)
Hemoglobin: 13.5 g/dL (ref 12.0–15.0)
MCH: 36.3 pg — ABNORMAL HIGH (ref 26.0–34.0)
MCHC: 33.8 g/dL (ref 30.0–36.0)
MCV: 107.3 fL — AB (ref 78.0–100.0)
Platelets: 58 10*3/uL — ABNORMAL LOW (ref 150–400)
RBC: 3.72 MIL/uL — ABNORMAL LOW (ref 3.87–5.11)
RDW: 13.9 % (ref 11.5–15.5)
WBC: 2.3 10*3/uL — AB (ref 4.0–10.5)

## 2014-11-11 MED ORDER — POTASSIUM CHLORIDE 20 MEQ/15ML (10%) PO SOLN
40.0000 meq | Freq: Once | ORAL | Status: AC
  Administered 2014-11-11: 40 meq via ORAL
  Filled 2014-11-11: qty 30

## 2014-11-11 NOTE — Care Management Note (Signed)
Case Management Note  Patient Details  Name: IZZA BICKLE MRN: [id number] Date of Birth: 1966/03/08  Subjective/Objective:                    Action/Plan:d/c plan home. No anticipated d/c needs.   Expected Discharge Date:   (unknown)               Expected Discharge Plan:  Home/Self Care  In-House Referral:     Discharge planning Services  CM Consult  Post Acute Care Choice:    Choice offered to:     DME Arranged:    DME Agency:     HH Arranged:    HH Agency:     Status of Service:  In process, will continue to follow  Medicare Important Message Given:    Date Medicare IM Given:    Medicare IM give by:    Date Additional Medicare IM Given:    Additional Medicare Important Message give by:     If discussed at [place] of Stay Meetings, dates discussed:    Additional Comments:  Dessa Phi, RN 11/11/2014, 2:27 PM

## 2014-11-11 NOTE — Progress Notes (Signed)
Spoke to MRI department and since MRCP not ordered as STAT would not be done til Monday. MRI says they still have 2 outpatient's and 2 STATs to do today. They only come in on the weekend to do STAT cases. So will need order changed by MD. Paged Dr Sanjuana Letters and NP Donnal Debar.

## 2014-11-11 NOTE — Progress Notes (Signed)
[facility] PROGRESS NOTE  [name] WJX:[id number] DOB: 11/13/1965 DOA: 11/07/2014 PCP: Cathlean Cower, MD  Brief narrative: 11/09/14: [name] is a 49 y.o. 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 This is a constant pain with no exacerbating factors. It has been worse for a few days. She has been taking about 6 Ibuprofen for it daily. She has had chills but no fever. One episode of non-bloody vomiting yesterday but after this she was able to eat without trouble. No diarrhea- actually is constipated. Abdomen was distended with ascitic fluid- she states it feels like she was having twins. She cannot recall if she took the spironolactone that was prescribed to her when discharged in May. CT did not show any acute pathology. Pain resolved after Dilaudid. Patient seems to have peritonitis as she still remains quite tender and she reports that antibody seem to help some. She admits to history of chills prior to admission. We will therefore put she is on Zosyn and reassess. Otherwise she reports that she is still constipated. Will add lactulose. Will continue to replenish potassium as needed and change Lasix to oral. 11/10/14: Reports that pain is better and that she had a bowel movement. Antibiotic seem to be helping. Will continue antibiotic for now. 11/11/14: Says abdominal pain is better but not completely resolved. Has gallstones, ? Cholecystitis. Will obtain MRCP. Continue Zosyn for now. Plan Abdominal pain, acute/gallstones/Cirrhosis of liver/Constipation/Peritonitis with effusion/Hypokalemia  Continue Zosyn  MRCP DM (diabetes mellitus), type 2/MDD (major depressive disorder)  No acute changes  Continue current medications Code Status: Full Code Family Communication: Patient at bedside. Disposition Plan: Eventually  home Consultants:  None Procedures:  Paracentesis Antibiotics:  Zosyn 11/09/2014>  HPI/Subjective: Says back pain is better.  Objective: Filed Vitals:   11/11/14 1352  BP: 105/69  Pulse: 84  Temp: 98.6 F (37 C)  Resp: 16    Intake/Output Summary (Last 24 hours) at 11/11/14 1710 Last data filed at 11/11/14 1353  Gross per 24 hour  Intake    240 ml  Output   1602 ml  Net  -1362 ml   Filed Weights   11/07/14 0621 11/07/14 1312  Weight: 52.617 kg (116 lb) 53.661 kg (118 lb 4.8 oz)    Exam:   General:  Comfortable at rest.  Cardiovascular: S1-S2 normal. No murmurs. Pulse regular.  Respiratory: Good air entry bilaterally. No rhonchi or rales.  Abdomen: Some tenderness to palpation, diffuse.  Musculoskeletal: No pedal edema   Neurological: Intact  Data Reviewed: Basic Metabolic Panel:  Recent Labs Lab 11/07/14 0644 11/08/14 0355 11/09/14 0340 11/10/14 0418 11/11/14 0335  NA 131* 129* 132* 134* 136  K 3.8 3.4* 3.2* 4.4 3.6  CL 99* 90* 90* 90* 93*  CO2 23 30 34* 38* 36*  GLUCOSE 409* 245* 216* 107* 270*  BUN 8 9 8 8 10   CREATININE 0.58 0.39* 0.43* 0.51 0.60  CALCIUM 9.3 8.6* 8.9 9.7 8.6*  MG  --   --   --  1.5*  --    Liver Function Tests:  Recent Labs Lab 11/07/14 0644 11/08/14 0355 11/10/14 0418 11/11/14 0335  AST 119* 112* 184* 134*  ALT 97* 82* 123* 108*  ALKPHOS 134* 110 106 103  BILITOT 3.4* 2.7* 2.4* 1.9*  PROT 8.5* 7.1 7.6 6.9  ALBUMIN 3.4* 3.0* 3.2* 2.8*    Recent  Labs Lab 11/07/14 0644  LIPASE 57*   No results for input(s): AMMONIA in the last 168 hours. CBC:  Recent Labs Lab 11/07/14 0644 11/08/14 0355 11/10/14 0418 11/11/14 0335  WBC 3.2* 3.3* 2.4* 2.3*  NEUTROABS 2.0  --   --   --   HGB 15.5* 13.5 14.6 13.5  HCT 45.7 40.4 43.6 39.9  MCV 106.3* 106.0* 105.8* 107.3*  PLT 68* 56* 71* 58*   Cardiac Enzymes: No results for input(s): CKTOTAL, CKMB, CKMBINDEX, TROPONINI in the last 168 hours. BNP (last 3  results) No results for input(s): BNP in the last 8760 hours.  ProBNP (last 3 results) No results for input(s): PROBNP in the last 8760 hours.  CBG:  Recent Labs Lab 11/10/14 1631 11/10/14 1658 11/10/14 2105 11/11/14 0745 11/11/14 1203  GLUCAP 52* 85 323* 184* 272*    Recent Results (from the past 240 hour(s))  Culture, body fluid-bottle     Status: None (Preliminary result)   Collection Time: 11/07/14 11:40 AM  Result Value Ref Range Status   Specimen Description FLUID PERITONEAL  Final   Special Requests BOTTLES DRAWN AEROBIC AND ANAEROBIC 10CC  Final   Culture   Final    NO GROWTH 4 DAYS Performed at [facility]    Report Status PENDING  Incomplete  Gram stain     Status: None   Collection Time: 11/07/14 11:40 AM  Result Value Ref Range Status   Specimen Description PERITONEAL  Final   Special Requests NONE  Final   Gram Stain   Final    WBC PRESENT,BOTH PMN AND MONONUCLEAR NO ORGANISMS SEEN Gram Stain Report Called to,Read Back By and Verified With: TORRES,D @ 8280 ON 11/07/14 POTEAT,S Performed at [facility]    Report Status 11/07/2014 FINAL  Final     Studies: No results found.  Scheduled Meds: . furosemide  40 mg Oral Daily  . insulin aspart  0-5 Units Subcutaneous QHS  . insulin aspart  0-9 Units Subcutaneous TID WC  . insulin aspart  5 Units Subcutaneous TID WC  . insulin glargine  20 Units Subcutaneous QHS  . multivitamin with minerals  1 tablet Oral Daily  . pantoprazole  40 mg Oral Daily  . piperacillin-tazobactam  3.375 g Intravenous 3 times per day  . sertraline  150 mg Oral Daily  . sodium chloride  3 mL Intravenous Q12H  . spironolactone  50 mg Oral BID   Continuous Infusions:    Time spent: 25 minutes    [name]  [facility] Pager [phone]. If 7PM-7AM, please contact night-coverage at [email or web address], password [facility] 11/11/2014, 5:10 PM  LOS: 4 days

## 2014-11-11 NOTE — Progress Notes (Signed)
Inpatient Diabetes Program Recommendations  AACE/ADA: New Consensus Statement on Inpatient Glycemic Control (2013)  Target Ranges:  Prepandial:   less than 140 mg/dL      Peak postprandial:   less than 180 mg/dL (1-2 hours)      Critically ill patients:  140 - 180 mg/dL   Reason for Visit: Hyperglycemia  Results for [name], [name] (MRN [id number]) as of 11/11/2014 12:44  Ref. Range 11/10/2014 16:31 11/10/2014 16:58 11/10/2014 21:05 11/11/2014 07:45 11/11/2014 12:03  Glucose-Capillary Latest Ref Range: 65-99 mg/dL 52 (L) 85 323 (H) 184 (H) 272 (H)  Results for [name], [name] (MRN [id number]) as of 11/11/2014 12:44  Ref. Range 11/08/2014 03:55 11/09/2014 03:40 11/10/2014 04:18 11/11/2014 03:35  Glucose Latest Ref Range: 65-99 mg/dL 245 (H) 216 (H) 107 (H) 270 (H)    Inpatient Diabetes Program Recommendations Insulin - Basal: Increase Lantus to 22 units QHS HgbA1C: Need updated dHgbA1C.  Note: Will continue to follow. Thank you. Lorenda Peck, RD, LDN, CDE Inpatient Diabetes Coordinator [phone]

## 2014-11-12 ENCOUNTER — Inpatient Hospital Stay (HOSPITAL_COMMUNITY): Payer: No Typology Code available for payment source

## 2014-11-12 DIAGNOSIS — K7031 Alcoholic cirrhosis of liver with ascites: Secondary | ICD-10-CM

## 2014-11-12 DIAGNOSIS — R100 Acute abdomen: Secondary | ICD-10-CM

## 2014-11-12 LAB — CBC WITH DIFFERENTIAL/PLATELET
BASOS PCT: 1 % (ref 0–1)
Basophils Absolute: 0 10*3/uL (ref 0.0–0.1)
Eosinophils Absolute: 0.1 10*3/uL (ref 0.0–0.7)
Eosinophils Relative: 4 % (ref 0–5)
HCT: 43.5 % (ref 36.0–46.0)
HEMOGLOBIN: 14.5 g/dL (ref 12.0–15.0)
LYMPHS PCT: 37 % (ref 12–46)
Lymphs Abs: 0.8 10*3/uL (ref 0.7–4.0)
MCH: 36.1 pg — AB (ref 26.0–34.0)
MCHC: 33.3 g/dL (ref 30.0–36.0)
MCV: 108.2 fL — ABNORMAL HIGH (ref 78.0–100.0)
MONO ABS: 0.3 10*3/uL (ref 0.1–1.0)
Monocytes Relative: 15 % — ABNORMAL HIGH (ref 3–12)
NEUTROS PCT: 43 % (ref 43–77)
Neutro Abs: 0.9 10*3/uL — ABNORMAL LOW (ref 1.7–7.7)
PLATELETS: 70 10*3/uL — AB (ref 150–400)
RBC: 4.02 MIL/uL (ref 3.87–5.11)
RDW: 13.8 % (ref 11.5–15.5)
WBC: 2.1 10*3/uL — ABNORMAL LOW (ref 4.0–10.5)

## 2014-11-12 LAB — COMPREHENSIVE METABOLIC PANEL
ALBUMIN: 2.7 g/dL — AB (ref 3.5–5.0)
ALT: 106 U/L — ABNORMAL HIGH (ref 14–54)
AST: 120 U/L — ABNORMAL HIGH (ref 15–41)
Alkaline Phosphatase: 101 U/L (ref 38–126)
Anion gap: 4 — ABNORMAL LOW (ref 5–15)
BUN: 10 mg/dL (ref 6–20)
CALCIUM: 8.8 mg/dL — AB (ref 8.9–10.3)
CO2: 34 mmol/L — ABNORMAL HIGH (ref 22–32)
CREATININE: 0.61 mg/dL (ref 0.44–1.00)
Chloride: 95 mmol/L — ABNORMAL LOW (ref 101–111)
GFR calc Af Amer: >60 mL/min (ref >60)
Glucose, Bld: 256 mg/dL — ABNORMAL HIGH (ref 65–99)
Potassium: 4.1 mmol/L (ref 3.5–5.1)
Sodium: 133 mmol/L — ABNORMAL LOW (ref 135–145)
Total Bilirubin: 1.7 mg/dL — ABNORMAL HIGH (ref 0.3–1.2)
Total Protein: 7.1 g/dL (ref 6.5–8.1)

## 2014-11-12 LAB — GLUCOSE, CAPILLARY
GLUCOSE-CAPILLARY: 303 mg/dL — AB (ref 65–99)
GLUCOSE-CAPILLARY: 114 mg/dL — AB (ref 65–99)
Glucose-Capillary: 306 mg/dL — ABNORMAL HIGH (ref 65–99)
Glucose-Capillary: 115 mg/dL — ABNORMAL HIGH (ref 65–99)
Glucose-Capillary: 167 mg/dL — ABNORMAL HIGH (ref 65–99)

## 2014-11-12 LAB — CULTURE, BODY FLUID-BOTTLE: CULTURE: NO GROWTH

## 2014-11-12 MED ORDER — GADOBENATE DIMEGLUMINE 529 MG/ML IV SOLN
10.0000 mL | Freq: Once | INTRAVENOUS | Status: AC | PRN
  Administered 2014-11-12: 10 mL via INTRAVENOUS

## 2014-11-12 NOTE — Progress Notes (Addendum)
[facility] PROGRESS NOTE  [name] TZG:[id number] DOB: [birth year] DOA: 11/07/2014 PCP: Cathlean Cower, MD  Brief narrative: 11/09/14: [name] is a 49 y.o. 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 This is a constant pain with no exacerbating factors. It has been worse for a few days. She has been taking about 6 Ibuprofen for it daily. She has had chills but no fever. One episode of non-bloody vomiting yesterday but after this she was able to eat without trouble. No diarrhea- actually is constipated. Abdomen was distended with ascitic fluid- she states it feels like she was having twins. She cannot recall if she took the spironolactone that was prescribed to her when discharged in May. CT did not show any acute pathology. Pain resolved after Dilaudid. Patient seems to have peritonitis as she still remains quite tender and she reports that antibody seem to help some. She admits to history of chills prior to admission. We will therefore put she is on Zosyn and reassess. Otherwise she reports that she is still constipated. Will add lactulose. Will continue to replenish potassium as needed and change Lasix to oral. 11/10/14: Reports that pain is better and that she had a bowel movement. Antibiotic seem to be helping. Will continue antibiotic for now. 11/11/14: Says abdominal pain is better but not completely resolved. Has gallstones, ? Cholecystitis. Will obtain MRCP. Continue Zosyn for now. 11/12/14: Continued to have abdominal pain and elevated LFTs prompting MRCP which showed "Evidence of cirrhosis with trace perihepatic ascites and splenomegaly which may indicate an element of portal hypertension. When compared to prior exams, there has been interval development of right hepatic lobe capsular retraction upon a background of contour nodularity, with ill-defined early  enhancement in this area but no measurable mass. This raises the question of cholangiocarcinoma. Consider consultation for possible image guided biopsy. Gallstone with gallbladder wall thickening likely related to underlying cirrhosis given chronicity of this finding, less likely cholecystitis. Allowing for extensive motion artifact, no intraductal filling defect or common or intrahepatic ductal dilatation". Will continue current antibiotic and consult to GI. Dr. Maurene Capes will graciously see patient in consult. Patient says pain is still there but somewhat better. Plan Abdominal pain, acute/gallstones/Cirrhosis of liver/Constipation/Peritonitis with effusion/Hypokalemia  Continue Zosyn  CEA/Ca19-9/CA125  Consult GI DM (diabetes mellitus), type 2/MDD (major depressive disorder)  No acute changes  Continue current medications Code Status: Full Code Family Communication: Patient at bedside. Disposition Plan: Eventually home Consultants:  None Procedures:  Paracentesis Antibiotics:  Zosyn 11/09/2014>  HPI/Subjective: Says pain is better.  Objective: Filed Vitals:   11/12/14 1405  BP: 110/70  Pulse: 75  Temp: 98.4 F (36.9 C)  Resp: 16    Intake/Output Summary (Last 24 hours) at 11/12/14 1426 Last data filed at 11/12/14 1424  Gross per 24 hour  Intake   1388 ml  Output   2525 ml  Net  -1137 ml   Filed Weights   11/07/14 0621 11/07/14 1312  Weight: 52.617 kg (116 lb) 53.661 kg (118 lb 4.8 oz)    Exam:   General:  Comfortable at rest.  Cardiovascular: S1-S2 normal. No murmurs. Pulse regular.  Respiratory: Good air entry bilaterally. No rhonchi or rales.  Abdomen: Soft and nontender. Normal bowel sounds. No organomegaly.  Musculoskeletal: No pedal edema   Neurological: Intact  Data Reviewed: Basic Metabolic Panel:  Recent Labs Lab  11/08/14 0355 11/09/14 0340 11/10/14 0418 11/11/14 0335 November 14, 2014 0356  NA 129* 132* 134* 136 133*  K 3.4* 3.2* 4.4 3.6  4.1  CL 90* 90* 90* 93* 95*  CO2 30 34* 38* 36* 34*  GLUCOSE 245* 216* 107* 270* 256*  BUN 9 8 8 10 10   CREATININE 0.39* 0.43* 0.51 0.60 0.61  CALCIUM 8.6* 8.9 9.7 8.6* 8.8*  MG  --   --  1.5*  --   --    Liver Function Tests:  Recent Labs Lab 11/07/14 0644 11/08/14 0355 11/10/14 0418 11/11/14 0335 11-14-2014 0356  AST 119* 112* 184* 134* 120*  ALT 97* 82* 123* 108* 106*  ALKPHOS 134* 110 106 103 101  BILITOT 3.4* 2.7* 2.4* 1.9* 1.7*  PROT 8.5* 7.1 7.6 6.9 7.1  ALBUMIN 3.4* 3.0* 3.2* 2.8* 2.7*    Recent Labs Lab 11/07/14 0644  LIPASE 57*   No results for input(s): AMMONIA in the last 168 hours. CBC:  Recent Labs Lab 11/07/14 0644 11/08/14 0355 11/10/14 0418 11/11/14 0335 2014-11-14 0356  WBC 3.2* 3.3* 2.4* 2.3* 2.1*  NEUTROABS 2.0  --   --   --  0.9*  HGB 15.5* 13.5 14.6 13.5 14.5  HCT 45.7 40.4 43.6 39.9 43.5  MCV 106.3* 106.0* 105.8* 107.3* 108.2*  PLT 68* 56* 71* 58* 70*   Cardiac Enzymes: No results for input(s): CKTOTAL, CKMB, CKMBINDEX, TROPONINI in the last 168 hours. BNP (last 3 results) No results for input(s): BNP in the last 8760 hours.  ProBNP (last 3 results) No results for input(s): PROBNP in the last 8760 hours.  CBG:  Recent Labs Lab 11/11/14 1719 11/11/14 2223 11-14-2014 0255 11-14-14 0727 11/14/14 1157  GLUCAP 162* 85 306* 115* 167*    Recent Results (from the past 240 hour(s))  Culture, body fluid-bottle     Status: None   Collection Time: 11/07/14 11:40 AM  Result Value Ref Range Status   Specimen Description FLUID PERITONEAL  Final   Special Requests BOTTLES DRAWN AEROBIC AND ANAEROBIC 10CC  Final   Culture   Final    NO GROWTH 5 DAYS Performed at [facility]    Report Status November 14, 2014 FINAL  Final  Gram stain     Status: None   Collection Time: 11/07/14 11:40 AM  Result Value Ref Range Status   Specimen Description PERITONEAL  Final   Special Requests NONE  Final   Gram Stain   Final    WBC PRESENT,BOTH PMN  AND MONONUCLEAR NO ORGANISMS SEEN Gram Stain Report Called to,Read Back By and Verified With: TORRES,D @ 2025 ON 11/07/14 POTEAT,S Performed at [facility]    Report Status 11/07/2014 FINAL  Final     Studies: Mr 3d Recon At Scanner  Nov 14, 2014   CLINICAL DATA:  Abdominal pain, jaundice, pancreatitis. History of cirrhosis.  EXAM: MRI ABDOMEN WITHOUT AND WITH CONTRAST (INCLUDING MRCP)  TECHNIQUE: Multiplanar multisequence MR imaging of the abdomen was performed both before and after the administration of intravenous contrast. Heavily T2-weighted images of the biliary and pancreatic ducts were obtained, and three-dimensional MRCP images were rendered by post processing.  CONTRAST:  46mL MULTIHANCE GADOBENATE DIMEGLUMINE 529 MG/ML IV SOLN  COMPARISON:  CT abdomen/pelvis 11/07/2014, abdominal ultrasound 11/07/2014.  FINDINGS: Lower chest: Images are degraded due to inability of the patient to follow breath hold instructions and motion degrading multiple series.  Lung bases are grossly clear.  Hepatobiliary: Small perihepatic ascites is reidentified with nodular hepatic contour compatible with cirrhosis.  Gallbladder wall thickening is reidentified measuring 5 mm. Dependent stone noted in within the gallbladder, image 50 series 11. No intra or extrahepatic ductal dilatation is identified. Maximal common duct diameter at its midportion measures 6 mm, with tapering to the ampulla. Allowing for motion artifact, no filling defect is identified within the nondilated common duct.  After administration of contrast, there is inhomogeneous enhancement at the periphery of the right upper lobe, image 21 series 1501, but no measurable mass or corresponding T2 weighted signal abnormality in these areas is identified. There is overlying capsular retraction which appears to have progressed from previous exam dating back to at least 12/24/2013, allowing for differences in technique.  Pancreas: Trace peripancreatic fluid  is identified but the pancreatic signal is homogeneous in intensity without focal abnormality. No pancreatic ductal dilatation.  Spleen: Splenomegaly noted without focal abnormality.  Adrenals/Urinary Tract: Adrenal glands are normal. 6 mm left upper renal pole cortical cyst. No hydronephrosis.  Stomach/Bowel: Grossly normal  Vascular/Lymphatic: No aortic aneurysm.  Other: No lymphadenopathy allowing for motion and technique.  Musculoskeletal: Grossly normal  IMPRESSION: Evidence of cirrhosis with trace perihepatic ascites and splenomegaly which may indicate an element of portal hypertension.  When compared to prior exams, there has been interval development of right hepatic lobe capsular retraction upon a background of contour nodularity, with ill-defined early enhancement in this area but no measurable mass. This raises the question of cholangiocarcinoma. Consider consultation for possible image guided biopsy.  Gallstone with gallbladder wall thickening likely related to underlying cirrhosis given chronicity of this finding, less likely cholecystitis.  Allowing for extensive motion artifact, no intraductal filling defect or common or intrahepatic ductal dilatation.   Electronically Signed   By: Conchita Paris M.D.   On: 11/12/2014 11:42   Mr Abd W/wo Cm/mrcp  11/12/2014   CLINICAL DATA:  Abdominal pain, jaundice, pancreatitis. History of cirrhosis.  EXAM: MRI ABDOMEN WITHOUT AND WITH CONTRAST (INCLUDING MRCP)  TECHNIQUE: Multiplanar multisequence MR imaging of the abdomen was performed both before and after the administration of intravenous contrast. Heavily T2-weighted images of the biliary and pancreatic ducts were obtained, and three-dimensional MRCP images were rendered by post processing.  CONTRAST:  57mL MULTIHANCE GADOBENATE DIMEGLUMINE 529 MG/ML IV SOLN  COMPARISON:  CT abdomen/pelvis 11/07/2014, abdominal ultrasound 11/07/2014.  FINDINGS: Lower chest: Images are degraded due to inability of the  patient to follow breath hold instructions and motion degrading multiple series.  Lung bases are grossly clear.  Hepatobiliary: Small perihepatic ascites is reidentified with nodular hepatic contour compatible with cirrhosis. Gallbladder wall thickening is reidentified measuring 5 mm. Dependent stone noted in within the gallbladder, image 50 series 11. No intra or extrahepatic ductal dilatation is identified. Maximal common duct diameter at its midportion measures 6 mm, with tapering to the ampulla. Allowing for motion artifact, no filling defect is identified within the nondilated common duct.  After administration of contrast, there is inhomogeneous enhancement at the periphery of the right upper lobe, image 21 series 1501, but no measurable mass or corresponding T2 weighted signal abnormality in these areas is identified. There is overlying capsular retraction which appears to have progressed from previous exam dating back to at least 12/24/2013, allowing for differences in technique.  Pancreas: Trace peripancreatic fluid is identified but the pancreatic signal is homogeneous in intensity without focal abnormality. No pancreatic ductal dilatation.  Spleen: Splenomegaly noted without focal abnormality.  Adrenals/Urinary Tract: Adrenal glands are normal. 6 mm left upper renal pole cortical cyst. No hydronephrosis.  Stomach/Bowel:  Grossly normal  Vascular/Lymphatic: No aortic aneurysm.  Other: No lymphadenopathy allowing for motion and technique.  Musculoskeletal: Grossly normal  IMPRESSION: Evidence of cirrhosis with trace perihepatic ascites and splenomegaly which may indicate an element of portal hypertension.  When compared to prior exams, there has been interval development of right hepatic lobe capsular retraction upon a background of contour nodularity, with ill-defined early enhancement in this area but no measurable mass. This raises the question of cholangiocarcinoma. Consider consultation for possible  image guided biopsy.  Gallstone with gallbladder wall thickening likely related to underlying cirrhosis given chronicity of this finding, less likely cholecystitis.  Allowing for extensive motion artifact, no intraductal filling defect or common or intrahepatic ductal dilatation.   Electronically Signed   By: Conchita Paris M.D.   On: 11/12/2014 11:42    Scheduled Meds: . furosemide  40 mg Oral Daily  . insulin aspart  0-5 Units Subcutaneous QHS  . insulin aspart  0-9 Units Subcutaneous TID WC  . insulin aspart  5 Units Subcutaneous TID WC  . insulin glargine  20 Units Subcutaneous QHS  . multivitamin with minerals  1 tablet Oral Daily  . pantoprazole  40 mg Oral Daily  . piperacillin-tazobactam  3.375 g Intravenous 3 times per day  . sertraline  150 mg Oral Daily  . sodium chloride  3 mL Intravenous Q12H  . spironolactone  50 mg Oral BID   Continuous Infusions:    Time spent: 25 minutes    Ahnyla Mendel  [facility] Pager [phone]. If 7PM-7AM, please contact night-coverage at [email or web address], password [facility] 11/12/2014, 2:26 PM  LOS: 5 days

## 2014-11-12 NOTE — Progress Notes (Signed)
MRCP order changed by NP.

## 2014-11-12 NOTE — Progress Notes (Signed)
CBG 85 tonight. Spoke to NP Walden Field about lantus. Ordered to hold it. Will recheck CBG between 2-3 am and cover with [employer]SI. Patient will be NPO for MRCP.

## 2014-11-12 NOTE — Consult Note (Signed)
Consultation   History of Present Illness:  This is a 49 yo WF with known [place] cirrhosis, portal hypertension,Hep C, , diffuse abd. pain x 4 weeks. Known gall stones on sono with 5 mm gall bladder wall, CBD 6 mm, and 18% EF on HIDA 01/2014, But normal EF 06/2014. Mallory-Weiss tear 12/2013 EGD, colon polyp 01/2014. Last alcohol intake 1 month ago. Asked to see because of an abnormal appearing enhancement on MRCP  Localized to RUL liver causing capsular retraction, liver appears cirrhotic, also splenomegaly and ascites ( WBC 325 , 4 polys). Pt describes almost constant non positional abd, pain, worse after eating, radiates around to her back.Pancreas appears normal on MRCP   Past Medical History  Diagnosis Date  . Impaired glucose tolerance 11/07/2010  . Hepatitis C approx dx 2005    no tx to date  . Pancreatitis     HX of  . Alcohol dependency     at least [place] admissions 2007- 09/2013 for detox.   Marland Kitchen GERD (gastroesophageal reflux disease)   . Chronic constipation   . Gallstones   . Drug dependency     Hx of cocaine use  . Anxiety and depression 08/2011  . Rheumatoid factor positive   . Hemochromatosis     last phlebotomy tx ~ 2012. liver bx 2006  . Pancytopenia   . Abnormal vaginal bleeding     uterine fibroid  . Gallstone 2006  . Chronic abdominal pain 2011  . Cirrhosis 11/09/2010  . Type II or unspecified type diabetes mellitus without mention of complication, uncontrolled 09/17/2011  . Major depression 11/2012, 09/2013    [facility] admissions for this, suicide attempt by OD Ambien (2014), Prozac (2015),ETOHism   . Anxiety    Past Surgical History  Procedure Laterality Date  . Appendectomy    . Cesarean section  x 2  . Foot surgery  2013  . Abdominal hysterectomy  12/17/2011    Procedure: HYSTERECTOMY ABDOMINAL;  Surgeon: Gus Height, MD;  Location: [facility];  Service: Gynecology;  Laterality: N/A;  . Salpingoophorectomy  12/17/2011    Procedure: SALPINGO OOPHERECTOMY;  Surgeon:  Gus Height, MD;  Location: [facility];  Service: Gynecology;  Laterality: Bilateral;  . Esophagogastroduodenoscopy N/A 12/10/2013    Procedure: ESOPHAGOGASTRODUODENOSCOPY (EGD);  Surgeon: Ladene Artist, MD;  Location: [facility];  Service: Endoscopy;  Laterality: N/A;  . Percutaneous liver biopsy  2011  . Colonoscopy N/A 02/18/2014    Procedure: COLONOSCOPY;  Surgeon: Gatha Mayer, MD;  Location: [facility];  Service: Endoscopy;  Laterality: N/A;    reports that she has never smoked. She has never used smokeless tobacco. She reports that she drinks alcohol. She reports that she does not use illicit drugs. family history includes Cancer in her mother; Diabetes in her father. Allergies  Allergen Reactions  . Betadine [Povidone Iodine] Hives        Review of Systems: abd. Pain, normal bowl habits, no N&V  The remainder of the 10 point ROS is negative except as outlined in H&P   Physical Exam: General appearance  Well developed, in no distress. Eyes- non icteric. HEENT nontraumatic, normocephalic. Mouth no lesions, tongue papillated, no cheilosis. Neck supple without adenopathy, thyroid not enlarged, no carotid bruits, no JVD. Lungs Clear to auscultation bilaterally. Cor normal S1, normal S2, regular rhythm, no murmur,  quiet precordium. Abdomen: tense, diffusely tender in all quadrants, normal bowl sounds, fluid wave, liver edge normal,  Rectal:not done Extremities no pedal edema. Skin no  lesions. Neurological alert and oriented x 3., no asterixis Psychological normal mood and affect.  Assessment and Plan:  I have reviewed the MRCP with the radiologist. Questionable lesion in the right lobe of the liver, may be  cirrhotic nodule, not seen on CT, or sono ( several months ago), possibility of developing [place] vs cholangioCa or cirrhotic liver. Will obtain AFP, Ca19-9 tumor markers, if elevated then recommend Bx under ultrasound. If normal, then repeat MR in 3-6 months for  stability. Abdominal pain not related to the MRCP findings, could be chronic low grade pancreatitis ( even though pancreas looks OK), symptomatic cholelithiasis ( gall bladder wall thickening may be due to low albumin), or SBP ( 325 WBC, cloudy- less likely.).   11/12/2014 Delfin Edis

## 2014-11-13 LAB — CBC WITH DIFFERENTIAL/PLATELET
BASOS ABS: 0 10*3/uL (ref 0.0–0.1)
Basophils Relative: 1 % (ref 0–1)
Eosinophils Absolute: 0.1 10*3/uL (ref 0.0–0.7)
Eosinophils Relative: 3 % (ref 0–5)
HEMATOCRIT: 42.9 % (ref 36.0–46.0)
HEMOGLOBIN: 14.2 g/dL (ref 12.0–15.0)
LYMPHS PCT: 32 % (ref 12–46)
Lymphs Abs: 0.7 10*3/uL (ref 0.7–4.0)
MCH: 35.5 pg — AB (ref 26.0–34.0)
MCHC: 33.1 g/dL (ref 30.0–36.0)
MCV: 107.3 fL — ABNORMAL HIGH (ref 78.0–100.0)
MONOS PCT: 21 % — AB (ref 3–12)
Monocytes Absolute: 0.5 10*3/uL (ref 0.1–1.0)
Neutro Abs: 0.9 10*3/uL — ABNORMAL LOW (ref 1.7–7.7)
Neutrophils Relative %: 43 % (ref 43–77)
PLATELETS: 68 10*3/uL — AB (ref 150–400)
RBC: 4 MIL/uL (ref 3.87–5.11)
RDW: 13.6 % (ref 11.5–15.5)
WBC: 2.2 10*3/uL — ABNORMAL LOW (ref 4.0–10.5)

## 2014-11-13 LAB — HEPATIC FUNCTION PANEL
ALBUMIN: 2.7 g/dL — AB (ref 3.5–5.0)
ALT: 105 U/L — ABNORMAL HIGH (ref 14–54)
AST: 128 U/L — ABNORMAL HIGH (ref 15–41)
Alkaline Phosphatase: 98 U/L (ref 38–126)
Bilirubin, Direct: 0.9 mg/dL — ABNORMAL HIGH (ref 0.1–0.5)
Indirect Bilirubin: 1 mg/dL — ABNORMAL HIGH (ref 0.3–0.9)
TOTAL PROTEIN: 6.9 g/dL (ref 6.5–8.1)
Total Bilirubin: 1.9 mg/dL — ABNORMAL HIGH (ref 0.3–1.2)

## 2014-11-13 LAB — BASIC METABOLIC PANEL
Anion gap: 5 (ref 5–15)
BUN: 12 mg/dL (ref 6–20)
CO2: 35 mmol/L — ABNORMAL HIGH (ref 22–32)
Calcium: 8.9 mg/dL (ref 8.9–10.3)
Chloride: 93 mmol/L — ABNORMAL LOW (ref 101–111)
Creatinine, Ser: 0.7 mg/dL (ref 0.44–1.00)
GFR calc Af Amer: >60 mL/min (ref >60)
GFR calc non Af Amer: >60 mL/min (ref >60)
GLUCOSE: 281 mg/dL — AB (ref 65–99)
POTASSIUM: 4.3 mmol/L (ref 3.5–5.1)
Sodium: 133 mmol/L — ABNORMAL LOW (ref 135–145)

## 2014-11-13 LAB — GLUCOSE, CAPILLARY
GLUCOSE-CAPILLARY: 224 mg/dL — AB (ref 65–99)
GLUCOSE-CAPILLARY: 131 mg/dL — AB (ref 65–99)
Glucose-Capillary: 259 mg/dL — ABNORMAL HIGH (ref 65–99)
Glucose-Capillary: 172 mg/dL — ABNORMAL HIGH (ref 65–99)

## 2014-11-13 MED ORDER — SPIRONOLACTONE 50 MG PO TABS
50.0000 mg | ORAL_TABLET | Freq: Every day | ORAL | Status: DC
  Administered 2014-11-14 – 2014-11-15 (×2): 50 mg via ORAL
  Filled 2014-11-13 (×3): qty 1

## 2014-11-13 MED ORDER — HYDROMORPHONE HCL 1 MG/ML IJ SOLN
0.5000 mg | INTRAMUSCULAR | Status: DC | PRN
  Administered 2014-11-13 – 2014-11-16 (×11): 0.5 mg via INTRAVENOUS
  Filled 2014-11-13 (×11): qty 1

## 2014-11-13 NOTE — Progress Notes (Signed)
Awaiting tumor markers, abdominal exam unchanged, chronic abd. Pain, ascites- 325 WBC, only 5 Polys, not likely SBP. Consider surgical consult for cholecystectomy.

## 2014-11-13 NOTE — Plan of Care (Signed)
Problem: Phase I Progression Outcomes Goal: Pain controlled with appropriate interventions Outcome: Progressing prns decrease pain in abdomen/back

## 2014-11-13 NOTE — Progress Notes (Signed)
[facility] PROGRESS NOTE  [name] ZOX:[id number] DOB: 12/22/1965 DOA: 11/07/2014 PCP: Cathlean Cower, MD  Brief narrative: 11/09/14: [name] is a 49 y.o. 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 This is a constant pain with no exacerbating factors. It has been worse for a few days. She has been taking about 6 Ibuprofen for it daily. She has had chills but no fever. One episode of non-bloody vomiting yesterday but after this she was able to eat without trouble. No diarrhea- actually is constipated. Abdomen was distended with ascitic fluid- she states it feels like she was having twins. She cannot recall if she took the spironolactone that was prescribed to her when discharged in May. CT did not show any acute pathology. Pain resolved after Dilaudid. Patient seems to have peritonitis as she still remains quite tender and she reports that antibody seem to help some. She admits to history of chills prior to admission. We will therefore put she is on Zosyn and reassess. Otherwise she reports that she is still constipated. Will add lactulose. Will continue to replenish potassium as needed and change Lasix to oral. 11/10/14: Reports that pain is better and that she had a bowel movement. Antibiotic seem to be helping. Will continue antibiotic for now. 11/11/14: Says abdominal pain is better but not completely resolved. Has gallstones, ? Cholecystitis. Will obtain MRCP. Continue Zosyn for now. 11/12/14: Continued to have abdominal pain and elevated LFTs prompting MRCP which showed "Evidence of cirrhosis with trace perihepatic ascites and splenomegaly which may indicate an element of portal hypertension. When compared to prior exams, there has been interval development of right hepatic lobe capsular retraction upon a background of contour nodularity, with ill-defined early  enhancement in this area but no measurable mass. This raises the question of cholangiocarcinoma. Consider consultation for possible image guided biopsy. Gallstone with gallbladder wall thickening likely related to underlying cirrhosis given chronicity of this finding, less likely cholecystitis. Allowing for extensive motion artifact, no intraductal filling defect or common or intrahepatic ductal dilatation". Will continue current antibiotic and consult to GI. Dr. Olevia Perches will graciously see patient in consult. Patient says pain is still there but somewhat better. 11/13/14: Appreciate GI. Patient continues to have abdominal pain despite antibiotic. BP borderline low. Will hold antibiotics and see response while awaiting tumor marker results-if fever, then need to consider cholecystitis, and drainage of his dose of Dilaudid/spironolactone in view of borderline low BP. Plan Abdominal pain, acute/gallstones/Cirrhosis of liver/Constipation/Peritonitis with effusion/Hypokalemia  Discontinue Zosyn  Follow CEA/Ca19-9/CA125  Reduce of Dilaudid/spironolactone.  Follow GI recommendations DM (diabetes mellitus), type 2/MDD (major depressive disorder)  No acute changes  Continue current medications Code Status: Full Code Family Communication: Patient at bedside. Disposition Plan: Eventually home Consultants:  GI Procedures:  Paracentesis Antibiotics:  Zosyn 11/09/2014> 11/13/2014  HPI/Subjective: Says pain is better  Objective: Filed Vitals:   11/13/14 0604  BP: 87/59  Pulse: 65  Temp: 98.4 F (36.9 C)  Resp: 16    Intake/Output Summary (Last 24 hours) at 11/13/14 0927 Last data filed at 11/13/14 0517  Gross per 24 hour  Intake    757 ml  Output   3600 ml  Net  -2843 ml   Filed Weights   11/07/14 0621 11/07/14 1312  Weight: 52.617 kg (116 lb) 53.661 kg (118 lb 4.8 oz)    Exam:  General:  Comfortable at rest.  Cardiovascular: S1-S2 normal. No murmurs. Pulse  regular.  Respiratory: Good air entry bilaterally. No rhonchi or rales.  Abdomen: Soft and nontender. Normal bowel sounds. No organomegaly.  Musculoskeletal: No pedal edema   Neurological: Intact  Data Reviewed: Basic Metabolic Panel:  Recent Labs Lab 11/09/14 0340 11/10/14 0418 11/11/14 0335 11-21-2014 0356 11/13/14 0458  NA 132* 134* 136 133* 133*  K 3.2* 4.4 3.6 4.1 4.3  CL 90* 90* 93* 95* 93*  CO2 34* 38* 36* 34* 35*  GLUCOSE 216* 107* 270* 256* 281*  BUN 8 8 10 10 12   CREATININE 0.43* 0.51 0.60 0.61 0.70  CALCIUM 8.9 9.7 8.6* 8.8* 8.9  MG  --  1.5*  --   --   --    Liver Function Tests:  Recent Labs Lab 11/08/14 0355 11/10/14 0418 11/11/14 0335 11/21/2014 0356 11/13/14 0458  AST 112* 184* 134* 120* 128*  ALT 82* 123* 108* 106* 105*  ALKPHOS 110 106 103 101 98  BILITOT 2.7* 2.4* 1.9* 1.7* 1.9*  PROT 7.1 7.6 6.9 7.1 6.9  ALBUMIN 3.0* 3.2* 2.8* 2.7* 2.7*    Recent Labs Lab 11/07/14 0644  LIPASE 57*   No results for input(s): AMMONIA in the last 168 hours. CBC:  Recent Labs Lab 11/07/14 0644 11/08/14 0355 11/10/14 0418 11/11/14 0335 November 21, 2014 0356 11/13/14 0458  WBC 3.2* 3.3* 2.4* 2.3* 2.1* 2.2*  NEUTROABS 2.0  --   --   --  0.9* 0.9*  HGB 15.5* 13.5 14.6 13.5 14.5 14.2  HCT 45.7 40.4 43.6 39.9 43.5 42.9  MCV 106.3* 106.0* 105.8* 107.3* 108.2* 107.3*  PLT 68* 56* 71* 58* 70* 68*   Cardiac Enzymes: No results for input(s): CKTOTAL, CKMB, CKMBINDEX, TROPONINI in the last 168 hours. BNP (last 3 results) No results for input(s): BNP in the last 8760 hours.  ProBNP (last 3 results) No results for input(s): PROBNP in the last 8760 hours.  CBG:  Recent Labs Lab 11/21/14 0727 21-Nov-2014 1157 2014-11-21 1650 November 21, 2014 2158 11/13/14 0751  GLUCAP 115* 167* 303* 114* 224*    Recent Results (from the past 240 hour(s))  Culture, body fluid-bottle     Status: None   Collection Time: 11/07/14 11:40 AM  Result Value Ref Range Status   Specimen  Description FLUID PERITONEAL  Final   Special Requests BOTTLES DRAWN AEROBIC AND ANAEROBIC 10CC  Final   Culture   Final    NO GROWTH 5 DAYS Performed at [facility]    Report Status 2014/11/21 FINAL  Final  Gram stain     Status: None   Collection Time: 11/07/14 11:40 AM  Result Value Ref Range Status   Specimen Description PERITONEAL  Final   Special Requests NONE  Final   Gram Stain   Final    WBC PRESENT,BOTH PMN AND MONONUCLEAR NO ORGANISMS SEEN Gram Stain Report Called to,Read Back By and Verified With: TORRES,D @ 6294 ON 11/07/14 POTEAT,S Performed at [facility]    Report Status 11/07/2014 FINAL  Final     Studies: Mr 3d Recon At Scanner  11-21-2014   CLINICAL DATA:  Abdominal pain, jaundice, pancreatitis. History of cirrhosis.  EXAM: MRI ABDOMEN WITHOUT AND WITH CONTRAST (INCLUDING MRCP)  TECHNIQUE: Multiplanar multisequence MR imaging of the abdomen was performed both before and after the administration of intravenous contrast. Heavily T2-weighted images of the biliary and pancreatic ducts were obtained, and three-dimensional MRCP images were rendered by post processing.  CONTRAST:  77mL MULTIHANCE GADOBENATE DIMEGLUMINE 529 MG/ML IV SOLN  COMPARISON:  CT abdomen/pelvis 11/07/2014, abdominal ultrasound 11/07/2014.  FINDINGS: Lower chest: Images are degraded due to inability of the patient to follow breath hold instructions and motion degrading multiple series.  Lung bases are grossly clear.  Hepatobiliary: Small perihepatic ascites is reidentified with nodular hepatic contour compatible with cirrhosis. Gallbladder wall thickening is reidentified measuring 5 mm. Dependent stone noted in within the gallbladder, image 50 series 11. No intra or extrahepatic ductal dilatation is identified. Maximal common duct diameter at its midportion measures 6 mm, with tapering to the ampulla. Allowing for motion artifact, no filling defect is identified within the nondilated common  duct.  After administration of contrast, there is inhomogeneous enhancement at the periphery of the right upper lobe, image 21 series 1501, but no measurable mass or corresponding T2 weighted signal abnormality in these areas is identified. There is overlying capsular retraction which appears to have progressed from previous exam dating back to at least 12/24/2013, allowing for differences in technique.  Pancreas: Trace peripancreatic fluid is identified but the pancreatic signal is homogeneous in intensity without focal abnormality. No pancreatic ductal dilatation.  Spleen: Splenomegaly noted without focal abnormality.  Adrenals/Urinary Tract: Adrenal glands are normal. 6 mm left upper renal pole cortical cyst. No hydronephrosis.  Stomach/Bowel: Grossly normal  Vascular/Lymphatic: No aortic aneurysm.  Other: No lymphadenopathy allowing for motion and technique.  Musculoskeletal: Grossly normal  IMPRESSION: Evidence of cirrhosis with trace perihepatic ascites and splenomegaly which may indicate an element of portal hypertension.  When compared to prior exams, there has been interval development of right hepatic lobe capsular retraction upon a background of contour nodularity, with ill-defined early enhancement in this area but no measurable mass. This raises the question of cholangiocarcinoma. Consider consultation for possible image guided biopsy.  Gallstone with gallbladder wall thickening likely related to underlying cirrhosis given chronicity of this finding, less likely cholecystitis.  Allowing for extensive motion artifact, no intraductal filling defect or common or intrahepatic ductal dilatation.   Electronically Signed   By: Conchita Paris M.D.   On: 11/12/2014 11:42   Mr Abd W/wo Cm/mrcp  11/12/2014   CLINICAL DATA:  Abdominal pain, jaundice, pancreatitis. History of cirrhosis.  EXAM: MRI ABDOMEN WITHOUT AND WITH CONTRAST (INCLUDING MRCP)  TECHNIQUE: Multiplanar multisequence MR imaging of the abdomen  was performed both before and after the administration of intravenous contrast. Heavily T2-weighted images of the biliary and pancreatic ducts were obtained, and three-dimensional MRCP images were rendered by post processing.  CONTRAST:  22mL MULTIHANCE GADOBENATE DIMEGLUMINE 529 MG/ML IV SOLN  COMPARISON:  CT abdomen/pelvis 11/07/2014, abdominal ultrasound 11/07/2014.  FINDINGS: Lower chest: Images are degraded due to inability of the patient to follow breath hold instructions and motion degrading multiple series.  Lung bases are grossly clear.  Hepatobiliary: Small perihepatic ascites is reidentified with nodular hepatic contour compatible with cirrhosis. Gallbladder wall thickening is reidentified measuring 5 mm. Dependent stone noted in within the gallbladder, image 50 series 11. No intra or extrahepatic ductal dilatation is identified. Maximal common duct diameter at its midportion measures 6 mm, with tapering to the ampulla. Allowing for motion artifact, no filling defect is identified within the nondilated common duct.  After administration of contrast, there is inhomogeneous enhancement at the periphery of the right upper lobe, image 21 series 1501, but no measurable mass or corresponding T2 weighted signal abnormality in these areas is identified. There is overlying capsular retraction which appears to have progressed from previous  exam dating back to at least 12/24/2013, allowing for differences in technique.  Pancreas: Trace peripancreatic fluid is identified but the pancreatic signal is homogeneous in intensity without focal abnormality. No pancreatic ductal dilatation.  Spleen: Splenomegaly noted without focal abnormality.  Adrenals/Urinary Tract: Adrenal glands are normal. 6 mm left upper renal pole cortical cyst. No hydronephrosis.  Stomach/Bowel: Grossly normal  Vascular/Lymphatic: No aortic aneurysm.  Other: No lymphadenopathy allowing for motion and technique.  Musculoskeletal: Grossly normal   IMPRESSION: Evidence of cirrhosis with trace perihepatic ascites and splenomegaly which may indicate an element of portal hypertension.  When compared to prior exams, there has been interval development of right hepatic lobe capsular retraction upon a background of contour nodularity, with ill-defined early enhancement in this area but no measurable mass. This raises the question of cholangiocarcinoma. Consider consultation for possible image guided biopsy.  Gallstone with gallbladder wall thickening likely related to underlying cirrhosis given chronicity of this finding, less likely cholecystitis.  Allowing for extensive motion artifact, no intraductal filling defect or common or intrahepatic ductal dilatation.   Electronically Signed   By: Conchita Paris M.D.   On: 11/12/2014 11:42    Scheduled Meds: . furosemide  40 mg Oral Daily  . insulin aspart  0-5 Units Subcutaneous QHS  . insulin aspart  0-9 Units Subcutaneous TID WC  . insulin aspart  5 Units Subcutaneous TID WC  . insulin glargine  20 Units Subcutaneous QHS  . multivitamin with minerals  1 tablet Oral Daily  . pantoprazole  40 mg Oral Daily  . sertraline  150 mg Oral Daily  . sodium chloride  3 mL Intravenous Q12H  . spironolactone  50 mg Oral BID   Continuous Infusions:    Time spent: 25 minutes    Myalee Stengel  [facility] Pager [phone]. If 7PM-7AM, please contact night-coverage at [email or web address], password [facility] 11/13/2014, 9:27 AM  LOS: 6 days

## 2014-11-14 DIAGNOSIS — R740 Nonspecific elevation of levels of transaminase and lactic acid dehydrogenase [LDH]: Secondary | ICD-10-CM

## 2014-11-14 DIAGNOSIS — K7460 Unspecified cirrhosis of liver: Secondary | ICD-10-CM

## 2014-11-14 DIAGNOSIS — R932 Abnormal findings on diagnostic imaging of liver and biliary tract: Secondary | ICD-10-CM

## 2014-11-14 LAB — CBC WITH DIFFERENTIAL/PLATELET
BASOS ABS: 0 10*3/uL (ref 0.0–0.1)
BASOS PCT: 0 % (ref 0–1)
EOS ABS: 0.1 10*3/uL (ref 0.0–0.7)
EOS PCT: 3 % (ref 0–5)
HEMATOCRIT: 43.5 % (ref 36.0–46.0)
HEMOGLOBIN: 14.8 g/dL (ref 12.0–15.0)
Lymphocytes Relative: 30 % (ref 12–46)
Lymphs Abs: 0.7 10*3/uL (ref 0.7–4.0)
MCH: 36.5 pg — ABNORMAL HIGH (ref 26.0–34.0)
MCHC: 34 g/dL (ref 30.0–36.0)
MCV: 107.1 fL — ABNORMAL HIGH (ref 78.0–100.0)
Monocytes Absolute: 0.6 10*3/uL (ref 0.1–1.0)
Monocytes Relative: 24 % — ABNORMAL HIGH (ref 3–12)
Neutro Abs: 1 10*3/uL — ABNORMAL LOW (ref 1.7–7.7)
Neutrophils Relative %: 43 % (ref 43–77)
Platelets: 63 10*3/uL — ABNORMAL LOW (ref 150–400)
RBC: 4.06 MIL/uL (ref 3.87–5.11)
RDW: 13.6 % (ref 11.5–15.5)
WBC: 2.4 10*3/uL — ABNORMAL LOW (ref 4.0–10.5)

## 2014-11-14 LAB — BASIC METABOLIC PANEL
ANION GAP: 6 (ref 5–15)
BUN: 14 mg/dL (ref 6–20)
CALCIUM: 9.2 mg/dL (ref 8.9–10.3)
CHLORIDE: 96 mmol/L — AB (ref 101–111)
CO2: 33 mmol/L — AB (ref 22–32)
CREATININE: 0.63 mg/dL (ref 0.44–1.00)
GFR calc Af Amer: >60 mL/min (ref >60)
GFR calc non Af Amer: >60 mL/min (ref >60)
Glucose, Bld: 181 mg/dL — ABNORMAL HIGH (ref 65–99)
Potassium: 4 mmol/L (ref 3.5–5.1)
SODIUM: 135 mmol/L (ref 135–145)

## 2014-11-14 LAB — GLUCOSE, CAPILLARY
GLUCOSE-CAPILLARY: 163 mg/dL — AB (ref 65–99)
Glucose-Capillary: 137 mg/dL — ABNORMAL HIGH (ref 65–99)
Glucose-Capillary: 276 mg/dL — ABNORMAL HIGH (ref 65–99)
Glucose-Capillary: 179 mg/dL — ABNORMAL HIGH (ref 65–99)

## 2014-11-14 LAB — CANCER ANTIGEN 19-9: CA 19-9: 62 U/mL — ABNORMAL HIGH (ref 0–35)

## 2014-11-14 LAB — HEPATIC FUNCTION PANEL
ALT: 104 U/L — ABNORMAL HIGH (ref 14–54)
AST: 131 U/L — AB (ref 15–41)
Albumin: 2.8 g/dL — ABNORMAL LOW (ref 3.5–5.0)
Alkaline Phosphatase: 91 U/L (ref 38–126)
Bilirubin, Direct: 0.9 mg/dL — ABNORMAL HIGH (ref 0.1–0.5)
Indirect Bilirubin: 1 mg/dL — ABNORMAL HIGH (ref 0.3–0.9)
TOTAL PROTEIN: 7.1 g/dL (ref 6.5–8.1)
Total Bilirubin: 1.9 mg/dL — ABNORMAL HIGH (ref 0.3–1.2)

## 2014-11-14 LAB — AFP TUMOR MARKER: AFP-Tumor Marker: 5.9 ng/mL (ref 0.0–8.3)

## 2014-11-14 NOTE — Progress Notes (Signed)
[facility] PROGRESS NOTE  [name] VQQ:[id number] DOB: 1965-06-05 DOA: 11/07/2014 PCP: Cathlean Cower, MD  Brief narrative: 11/09/14: [name] is a 49 y.o. 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 This is a constant pain with no exacerbating factors. It has been worse for a few days. She has been taking about 6 Ibuprofen for it daily. She has had chills but no fever. One episode of non-bloody vomiting yesterday but after this she was able to eat without trouble. No diarrhea- actually is constipated. Abdomen was distended with ascitic fluid- she states it feels like she was having twins. She cannot recall if she took the spironolactone that was prescribed to her when discharged in May. CT did not show any acute pathology. Pain resolved after Dilaudid. Patient seems to have peritonitis as she still remains quite tender and she reports that antibody seem to help some. She admits to history of chills prior to admission. We will therefore put she is on Zosyn and reassess. Otherwise she reports that she is still constipated. Will add lactulose. Will continue to replenish potassium as needed and change Lasix to oral. 11/10/14: Reports that pain is better and that she had a bowel movement. Antibiotic seem to be helping. Will continue antibiotic for now. 11/11/14: Says abdominal pain is better but not completely resolved. Has gallstones, ? Cholecystitis. Will obtain MRCP. Continue Zosyn for now. 11/12/14: Continued to have abdominal pain and elevated LFTs prompting MRCP which showed "Evidence of cirrhosis with trace perihepatic ascites and splenomegaly which may indicate an element of portal hypertension. When compared to prior exams, there has been interval development of right hepatic lobe capsular retraction upon a background of contour nodularity, with ill-defined early  enhancement in this area but no measurable mass. This raises the question of cholangiocarcinoma. Consider consultation for possible image guided biopsy. Gallstone with gallbladder wall thickening likely related to underlying cirrhosis given chronicity of this finding, less likely cholecystitis. Allowing for extensive motion artifact, no intraductal filling defect or common or intrahepatic ductal dilatation". Will continue current antibiotic and consult to GI. Dr. Olevia Perches will graciously see patient in consult. Patient says pain is still there but somewhat better. 11/13/14: Appreciate GI. Patient continues to have abdominal pain despite antibiotic. BP borderline low. Will hold antibiotics and see response while awaiting tumor marker results-if fever, then need to consider cholecystitis, and drainage of his dose of Dilaudid/spironolactone in view of borderline low BP. 11/14/14: CA-19-9 elevated at 62, AFP normal. Patient still has abdominal pain. I agree with general surgery consultation-?Cholecystectomy. Spoke with we'll and general surgery will see patient in consult. Will continue to offset patient off antibiotic for now. Plan Abdominal pain, acute/gallstones/Cirrhosis of liver/Constipation/Peritonitis with effusion/Hypokalemia  Consult Gen. surgery  Follow tumor markers  Follow GI recommendations DM (diabetes mellitus), type 2/MDD (major depressive disorder)  No acute changes  Continue current medications Code Status: Full Code Family Communication: Patient at bedside. Disposition Plan: Eventually home Consultants:  GI Procedures:  Paracentesis Antibiotics:  Zosyn 11/09/2014> 11/13/2014  HPI/Subjective: Says pain is still there but better.  Objective: Filed Vitals:   11/14/14 0435  BP: 109/69  Pulse: 65  Temp: 98.3 F (36.8 C)  Resp: 14    Intake/Output Summary (Last 24 hours) at 11/14/14 1024 Last data filed at 11/14/14 0435  Gross per 24 hour  Intake    440 ml  Output    3850 ml  Net  -3410 ml   Filed Weights   11/07/14 0621 11/07/14 1312  Weight: 52.617 kg (116 lb) 53.661 kg (118 lb 4.8 oz)    Exam:   General:  Comfortable at rest.  Cardiovascular: S1-S2 normal. No murmurs. Pulse regular.  Respiratory: Good air entry bilaterally. No rhonchi or rales.  Abdomen: Distended with some tenderness to palpation  Musculoskeletal: No pedal edema   Neurological: Intact  Data Reviewed: Basic Metabolic Panel:  Recent Labs Lab 11/10/14 0418 11/11/14 0335 12/09/2014 0356 11/13/14 0458 11/14/14 0416  NA 134* 136 133* 133* 135  K 4.4 3.6 4.1 4.3 4.0  CL 90* 93* 95* 93* 96*  CO2 38* 36* 34* 35* 33*  GLUCOSE 107* 270* 256* 281* 181*  BUN 8 10 10 12 14   CREATININE 0.51 0.60 0.61 0.70 0.63  CALCIUM 9.7 8.6* 8.8* 8.9 9.2  MG 1.5*  --   --   --   --    Liver Function Tests:  Recent Labs Lab 11/10/14 0418 11/11/14 0335 12-09-14 0356 11/13/14 0458 11/14/14 0416  AST 184* 134* 120* 128* 131*  ALT 123* 108* 106* 105* 104*  ALKPHOS 106 103 101 98 91  BILITOT 2.4* 1.9* 1.7* 1.9* 1.9*  PROT 7.6 6.9 7.1 6.9 7.1  ALBUMIN 3.2* 2.8* 2.7* 2.7* 2.8*   No results for input(s): LIPASE, AMYLASE in the last 168 hours. No results for input(s): AMMONIA in the last 168 hours. CBC:  Recent Labs Lab 11/10/14 0418 11/11/14 0335 2014/12/09 0356 11/13/14 0458 11/14/14 0416  WBC 2.4* 2.3* 2.1* 2.2* 2.4*  NEUTROABS  --   --  0.9* 0.9* 1.0*  HGB 14.6 13.5 14.5 14.2 14.8  HCT 43.6 39.9 43.5 42.9 43.5  MCV 105.8* 107.3* 108.2* 107.3* 107.1*  PLT 71* 58* 70* 68* 63*   Cardiac Enzymes: No results for input(s): CKTOTAL, CKMB, CKMBINDEX, TROPONINI in the last 168 hours. BNP (last 3 results) No results for input(s): BNP in the last 8760 hours.  ProBNP (last 3 results) No results for input(s): PROBNP in the last 8760 hours.  CBG:  Recent Labs Lab 11/13/14 0751 11/13/14 1126 11/13/14 1726 11/13/14 2129 11/14/14 0733  GLUCAP 224* 259* 172* 131* 137*     Recent Results (from the past 240 hour(s))  Culture, body fluid-bottle     Status: None   Collection Time: 11/07/14 11:40 AM  Result Value Ref Range Status   Specimen Description FLUID PERITONEAL  Final   Special Requests BOTTLES DRAWN AEROBIC AND ANAEROBIC 10CC  Final   Culture   Final    NO GROWTH 5 DAYS Performed at [facility]    Report Status 2014-12-09 FINAL  Final  Gram stain     Status: None   Collection Time: 11/07/14 11:40 AM  Result Value Ref Range Status   Specimen Description PERITONEAL  Final   Special Requests NONE  Final   Gram Stain   Final    WBC PRESENT,BOTH PMN AND MONONUCLEAR NO ORGANISMS SEEN Gram Stain Report Called to,Read Back By and Verified With: TORRES,D @ 9357 ON 11/07/14 POTEAT,S Performed at [facility]    Report Status 11/07/2014 FINAL  Final     Studies: Mr 3d Recon At Scanner  09-Dec-2014   CLINICAL DATA:  Abdominal pain, jaundice, pancreatitis. History of cirrhosis.  EXAM: MRI ABDOMEN WITHOUT AND WITH CONTRAST (INCLUDING MRCP)  TECHNIQUE: Multiplanar multisequence MR imaging of the abdomen was performed both before and after the administration  of intravenous contrast. Heavily T2-weighted images of the biliary and pancreatic ducts were obtained, and three-dimensional MRCP images were rendered by post processing.  CONTRAST:  51mL MULTIHANCE GADOBENATE DIMEGLUMINE 529 MG/ML IV SOLN  COMPARISON:  CT abdomen/pelvis 11/07/2014, abdominal ultrasound 11/07/2014.  FINDINGS: Lower chest: Images are degraded due to inability of the patient to follow breath hold instructions and motion degrading multiple series.  Lung bases are grossly clear.  Hepatobiliary: Small perihepatic ascites is reidentified with nodular hepatic contour compatible with cirrhosis. Gallbladder wall thickening is reidentified measuring 5 mm. Dependent stone noted in within the gallbladder, image 50 series 11. No intra or extrahepatic ductal dilatation is identified. Maximal  common duct diameter at its midportion measures 6 mm, with tapering to the ampulla. Allowing for motion artifact, no filling defect is identified within the nondilated common duct.  After administration of contrast, there is inhomogeneous enhancement at the periphery of the right upper lobe, image 21 series 1501, but no measurable mass or corresponding T2 weighted signal abnormality in these areas is identified. There is overlying capsular retraction which appears to have progressed from previous exam dating back to at least 12/24/2013, allowing for differences in technique.  Pancreas: Trace peripancreatic fluid is identified but the pancreatic signal is homogeneous in intensity without focal abnormality. No pancreatic ductal dilatation.  Spleen: Splenomegaly noted without focal abnormality.  Adrenals/Urinary Tract: Adrenal glands are normal. 6 mm left upper renal pole cortical cyst. No hydronephrosis.  Stomach/Bowel: Grossly normal  Vascular/Lymphatic: No aortic aneurysm.  Other: No lymphadenopathy allowing for motion and technique.  Musculoskeletal: Grossly normal  IMPRESSION: Evidence of cirrhosis with trace perihepatic ascites and splenomegaly which may indicate an element of portal hypertension.  When compared to prior exams, there has been interval development of right hepatic lobe capsular retraction upon a background of contour nodularity, with ill-defined early enhancement in this area but no measurable mass. This raises the question of cholangiocarcinoma. Consider consultation for possible image guided biopsy.  Gallstone with gallbladder wall thickening likely related to underlying cirrhosis given chronicity of this finding, less likely cholecystitis.  Allowing for extensive motion artifact, no intraductal filling defect or common or intrahepatic ductal dilatation.   Electronically Signed   By: Conchita Paris M.D.   On: 11/12/2014 11:42   Mr Abd W/wo Cm/mrcp  11/12/2014   CLINICAL DATA:  Abdominal  pain, jaundice, pancreatitis. History of cirrhosis.  EXAM: MRI ABDOMEN WITHOUT AND WITH CONTRAST (INCLUDING MRCP)  TECHNIQUE: Multiplanar multisequence MR imaging of the abdomen was performed both before and after the administration of intravenous contrast. Heavily T2-weighted images of the biliary and pancreatic ducts were obtained, and three-dimensional MRCP images were rendered by post processing.  CONTRAST:  17mL MULTIHANCE GADOBENATE DIMEGLUMINE 529 MG/ML IV SOLN  COMPARISON:  CT abdomen/pelvis 11/07/2014, abdominal ultrasound 11/07/2014.  FINDINGS: Lower chest: Images are degraded due to inability of the patient to follow breath hold instructions and motion degrading multiple series.  Lung bases are grossly clear.  Hepatobiliary: Small perihepatic ascites is reidentified with nodular hepatic contour compatible with cirrhosis. Gallbladder wall thickening is reidentified measuring 5 mm. Dependent stone noted in within the gallbladder, image 50 series 11. No intra or extrahepatic ductal dilatation is identified. Maximal common duct diameter at its midportion measures 6 mm, with tapering to the ampulla. Allowing for motion artifact, no filling defect is identified within the nondilated common duct.  After administration of contrast, there is inhomogeneous enhancement at the periphery of the right upper lobe, image 21 series 1501, but  no measurable mass or corresponding T2 weighted signal abnormality in these areas is identified. There is overlying capsular retraction which appears to have progressed from previous exam dating back to at least 12/24/2013, allowing for differences in technique.  Pancreas: Trace peripancreatic fluid is identified but the pancreatic signal is homogeneous in intensity without focal abnormality. No pancreatic ductal dilatation.  Spleen: Splenomegaly noted without focal abnormality.  Adrenals/Urinary Tract: Adrenal glands are normal. 6 mm left upper renal pole cortical cyst. No  hydronephrosis.  Stomach/Bowel: Grossly normal  Vascular/Lymphatic: No aortic aneurysm.  Other: No lymphadenopathy allowing for motion and technique.  Musculoskeletal: Grossly normal  IMPRESSION: Evidence of cirrhosis with trace perihepatic ascites and splenomegaly which may indicate an element of portal hypertension.  When compared to prior exams, there has been interval development of right hepatic lobe capsular retraction upon a background of contour nodularity, with ill-defined early enhancement in this area but no measurable mass. This raises the question of cholangiocarcinoma. Consider consultation for possible image guided biopsy.  Gallstone with gallbladder wall thickening likely related to underlying cirrhosis given chronicity of this finding, less likely cholecystitis.  Allowing for extensive motion artifact, no intraductal filling defect or common or intrahepatic ductal dilatation.   Electronically Signed   By: Conchita Paris M.D.   On: 11/12/2014 11:42    Scheduled Meds: . furosemide  40 mg Oral Daily  . insulin aspart  0-5 Units Subcutaneous QHS  . insulin aspart  0-9 Units Subcutaneous TID WC  . insulin aspart  5 Units Subcutaneous TID WC  . insulin glargine  20 Units Subcutaneous QHS  . multivitamin with minerals  1 tablet Oral Daily  . pantoprazole  40 mg Oral Daily  . sertraline  150 mg Oral Daily  . sodium chloride  3 mL Intravenous Q12H  . spironolactone  50 mg Oral Daily   Continuous Infusions:    Time spent: 25 minutes    [name]  [facility] Pager [phone]. If 7PM-7AM, please contact night-coverage at [email or web address], password [facility] 11/14/2014, 10:24 AM  LOS: 7 days

## 2014-11-14 NOTE — Consult Note (Signed)
Reason for Consult:   Chronic abdominal pain and cholelithiasis  Referring Physician: DR. Frances Nickels is an 49 y.o. female.   HPI: Pt presented to ED on 11/07/14 with 10/10 pain right side going to her back, one episode of nausea and emesis day prior to admit, unrelieved by Ibuprofen.  She reports taking about 8 tablets of ibuprofen per day.  She also complained of abdominal distension and constipation for four days unrelieved by MAG Citrate.  No fever. She was admitted with acute pain and treated with SSE, and Dulcolax for her constipation. Because of ascites she was started on Rocephin for possible bacterial peritonitis.  Paracentesis 730 ml obtained on 7/18, revealed some WBC, but culture and gram stain are negative. MRCP on 7/23 showed:  Evidence of cirrhosis with trace perihepatic ascites and slenomegaly which may indicate an element of portal hypertension. When compared to prior exams, there has been interval development of right hepatic lobe capsular retraction upon a background of contour nodularity, with ill-defined early enhancement in this area but no measurable mass. This raises the question of cholangiocarcinoma. Consider consultation for possible image guided biopsy. Gallstones with gallbladder wall thickening likely related to underlying cirrhosis given chronicity of this finding, less likely cholecystitis.  GI consult on 7/23 by Dr. Olevia Perches. Differential diagnosis included: cirrhotic nodule, Hepatitis C vs cholangiocarcinoma, vs cirrhotic liver, vs symptomatic vs low grade pancreatitis vs symptomatic cholelithiasis.  Tumor markers were ordered and biopsy was condsidered.  Today GI notes, stable, elevated LFT's, platelet count 63K, with INR 1.32.  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 She was seen by our service and DR  Zella Richer 12/08/13 with same complaints.  She had a Mallory Weiss tear by EGD that admit with ulcers.  It was Dr. Zella Richer opinion she was not a surgical candidate due to her multiple medical issues.  She had a HIDA scan on 02/14/14 No pain with CCK EF was low at 18%.   Repeat HIDA scan 07/21/14 shows: There is prompt uptake. Gallbladder uptake is seen at 5 min. Small bowel uptake this is identified 35 min.  Normal exam.    Currently she is afebrile, VSS.  Labs show Mild LFT elevations, WBC 2.4, platelets 63K, INR 1.32. AFP - 5.9 (norm  0-8.3), CA 19-9 -  62 (norm 0-35) We are ask to see about cholelithiasis.  She is on Carb modified diet currently. Taking PO's without issue.  She continues to have pain RUQ.  Past Medical History  Diagnosis Date  . Impaired glucose tolerance 11/07/2010  . Hepatitis C approx dx 2005    no tx to date  . Pancreatitis     HX of  . Alcohol dependency     at least [place] admissions 2007- 09/2013 for detox.   Marland Kitchen GERD (gastroesophageal reflux disease)   . Chronic constipation   . Gallstones   . Drug dependency     Hx of cocaine use  . Anxiety and depression 08/2011  . Rheumatoid factor positive   . Hemochromatosis     last phlebotomy tx ~ 2012. liver bx 2006  . Pancytopenia   . Abnormal vaginal bleeding     uterine fibroid  . Gallstone 2006  . Chronic abdominal pain 2011  . Cirrhosis 11/09/2010  . Type II or unspecified type diabetes mellitus  without mention of complication, uncontrolled 09/17/2011  . Major depression 11/2012, 09/2013    [facility] admissions for this, suicide attempt by OD Ambien (2014), Prozac (2015),ETOHism   . Anxiety     Past Surgical History  Procedure Laterality Date  . Appendectomy    . Cesarean section  x 2  . Foot surgery  2013  . Abdominal hysterectomy  12/17/2011    Procedure: HYSTERECTOMY ABDOMINAL;  Surgeon: Gus Height, MD;  Location: [facility];  Service: Gynecology;  Laterality: N/A;  . Salpingoophorectomy  12/17/2011    Procedure:  SALPINGO OOPHERECTOMY;  Surgeon: Gus Height, MD;  Location: [facility];  Service: Gynecology;  Laterality: Bilateral;  . Esophagogastroduodenoscopy N/A 12/10/2013    Procedure: ESOPHAGOGASTRODUODENOSCOPY (EGD);  Surgeon: Ladene Artist, MD;  Location: [facility];  Service: Endoscopy;  Laterality: N/A;  . Percutaneous liver biopsy  2011  . Colonoscopy N/A 02/18/2014    Procedure: COLONOSCOPY;  Surgeon: Gatha Mayer, MD;  Location: [facility];  Service: Endoscopy;  Laterality: N/A;    Family History  Problem Relation Age of Onset  . Cancer Mother     ovarian  . Diabetes Father     Social History:  reports that she has never smoked. She has never used smokeless tobacco. She reports that she drinks alcohol. She reports that she does not use illicit drugs. ETOH - quit about 1.5 months ago Drugs:  None for almost 2 years Tobacco:  None for 10+ years Lives with husband, unemployed, trying to get disability.  Allergies:  Allergies  Allergen Reactions  . Betadine [Povidone Iodine] Hives    Medications:  Prior to Admission:  Prescriptions prior to admission  Medication Sig Dispense Refill Last Dose  . diphenhydrAMINE (BENADRYL) 25 MG tablet Take 50 mg by mouth at bedtime as needed for sleep.    11/06/2014 at Unknown time  . esomeprazole (NEXIUM) 40 MG capsule Take 40 mg by mouth daily as needed (heartburn).   11/06/2014 at Unknown time  . ibuprofen (ADVIL,MOTRIN) 200 MG tablet Take 400 mg by mouth 3 (three) times daily.    11/06/2014 at Unknown time  . insulin aspart (NOVOLOG) 100 UNIT/ML injection Inject 3 Units into the skin 3 (three) times daily before meals. (Patient taking differently: Inject 5 Units into the skin 3 (three) times daily with meals. ) 10 mL 11 11/07/2014 at Unknown time  . insulin glargine (LANTUS) 100 UNIT/ML injection Inject 0.15 mLs (15 Units total) into the skin at bedtime. (Patient taking differently: Inject 15 Units into the skin at bedtime. At 10pm) 10 mL 11  11/06/2014 at Unknown time  . metFORMIN (GLUCOPHAGE) 500 MG tablet TAKE 1 TABLET (500 MG TOTAL) BY MOUTH 2 (TWO) TIMES DAILY WITH A MEAL. 60 tablet 5 11/06/2014 at 1800  . Multiple Vitamin (MULTIVITAMIN WITH MINERALS) TABS tablet Take 1 tablet by mouth daily.    11/06/2014 at Unknown time  . sertraline (ZOLOFT) 100 MG tablet Take 150 mg by mouth daily.   11/06/2014 at Unknown time   Scheduled: . furosemide  40 mg Oral Daily  . insulin aspart  0-5 Units Subcutaneous QHS  . insulin aspart  0-9 Units Subcutaneous TID WC  . insulin aspart  5 Units Subcutaneous TID WC  . insulin glargine  20 Units Subcutaneous QHS  . multivitamin with minerals  1 tablet Oral Daily  . pantoprazole  40 mg Oral Daily  . sertraline  150 mg Oral Daily  . sodium chloride  3 mL Intravenous Q12H  .  spironolactone  50 mg Oral Daily   Continuous:  QQP:YPPJKD chloride, diphenhydrAMINE, HYDROmorphone (DILAUDID) injection, ondansetron **OR** ondansetron (ZOFRAN) IV, oxyCODONE, sodium chloride Anti-infectives    Start     Dose/Rate Route Frequency Ordered Stop   11/09/14 2000  piperacillin-tazobactam (ZOSYN) IVPB 3.375 g  Status:  Discontinued     3.375 g 12.5 mL/hr over 240 Minutes Intravenous 3 times per day 11/09/14 1811 11/13/14 0926   11/07/14 1015  cefTRIAXone (ROCEPHIN) 1 g in dextrose 5 % 50 mL IVPB  Status:  Discontinued     1 g 100 mL/hr over 30 Minutes Intravenous  Once 11/07/14 1014 11/07/14 1046      Results for orders placed or performed during the hospital encounter of 11/07/14 (from the past 48 hour(s))  Glucose, capillary     Status: Abnormal   Collection Time: 11/12/14  4:50 PM  Result Value Ref Range   Glucose-Capillary 303 (H) 65 - 99 mg/dL  Glucose, capillary     Status: Abnormal   Collection Time: 11/12/14  9:58 PM  Result Value Ref Range   Glucose-Capillary 114 (H) 65 - 99 mg/dL   Comment 1 Notify RN   CBC with Differential/Platelet     Status: Abnormal   Collection Time: 11/13/14  4:58 AM   Result Value Ref Range   WBC 2.2 (L) 4.0 - 10.5 K/uL   RBC 4.00 3.87 - 5.11 MIL/uL   Hemoglobin 14.2 12.0 - 15.0 g/dL   HCT 42.9 36.0 - 46.0 %   MCV 107.3 (H) 78.0 - 100.0 fL   MCH 35.5 (H) 26.0 - 34.0 pg   MCHC 33.1 30.0 - 36.0 g/dL   RDW 13.6 11.5 - 15.5 %   Platelets 68 (L) 150 - 400 K/uL    Comment: CONSISTENT WITH PREVIOUS RESULT   Neutrophils Relative % 43 43 - 77 %   Lymphocytes Relative 32 12 - 46 %   Monocytes Relative 21 (H) 3 - 12 %   Eosinophils Relative 3 0 - 5 %   Basophils Relative 1 0 - 1 %   Neutro Abs 0.9 (L) 1.7 - 7.7 K/uL   Lymphs Abs 0.7 0.7 - 4.0 K/uL   Monocytes Absolute 0.5 0.1 - 1.0 K/uL   Eosinophils Absolute 0.1 0.0 - 0.7 K/uL   Basophils Absolute 0.0 0.0 - 0.1 K/uL   Smear Review MORPHOLOGY UNREMARKABLE   Basic metabolic panel     Status: Abnormal   Collection Time: 11/13/14  4:58 AM  Result Value Ref Range   Sodium 133 (L) 135 - 145 mmol/L   Potassium 4.3 3.5 - 5.1 mmol/L   Chloride 93 (L) 101 - 111 mmol/L   CO2 35 (H) 22 - 32 mmol/L   Glucose, Bld 281 (H) 65 - 99 mg/dL   BUN 12 6 - 20 mg/dL   Creatinine, Ser 0.70 0.44 - 1.00 mg/dL   Calcium 8.9 8.9 - 10.3 mg/dL   GFR calc non Af Amer >60 >60 mL/min   GFR calc Af Amer >60 >60 mL/min    Comment: (NOTE) The eGFR has been calculated using the CKD EPI equation. This calculation has not been validated in all clinical situations. eGFR's persistently <60 mL/min signify possible Chronic Kidney Disease.    Anion gap 5 5 - 15  Hepatic function panel     Status: Abnormal   Collection Time: 11/13/14  4:58 AM  Result Value Ref Range   Total Protein 6.9 6.5 - 8.1 g/dL   Albumin 2.7 (  L) 3.5 - 5.0 g/dL   AST 128 (H) 15 - 41 U/L   ALT 105 (H) 14 - 54 U/L   Alkaline Phosphatase 98 38 - 126 U/L   Total Bilirubin 1.9 (H) 0.3 - 1.2 mg/dL   Bilirubin, Direct 0.9 (H) 0.1 - 0.5 mg/dL   Indirect Bilirubin 1.0 (H) 0.3 - 0.9 mg/dL  AFP tumor marker     Status: None   Collection Time: 11/13/14  4:58 AM   Result Value Ref Range   AFP-Tumor Marker 5.9 0.0 - 8.3 ng/mL    Comment: (NOTE) Roche ECLIA methodology Performed At: [facility] [place], [place] [id number] Lindon Romp MD [id number]   Cancer antigen 19-9     Status: Abnormal   Collection Time: 11/13/14  4:58 AM  Result Value Ref Range   CA 19-9 62 (H) 0 - 35 U/mL    Comment: (NOTE) Roche ECLIA methodology Performed At: [facility] [place], [place] [id number] Lindon Romp MD ZL:[id number]   Glucose, capillary     Status: Abnormal   Collection Time: 11/13/14  7:51 AM  Result Value Ref Range   Glucose-Capillary 224 (H) 65 - 99 mg/dL  Glucose, capillary     Status: Abnormal   Collection Time: 11/13/14 11:26 AM  Result Value Ref Range   Glucose-Capillary 259 (H) 65 - 99 mg/dL  Glucose, capillary     Status: Abnormal   Collection Time: 11/13/14  5:26 PM  Result Value Ref Range   Glucose-Capillary 172 (H) 65 - 99 mg/dL  Glucose, capillary     Status: Abnormal   Collection Time: 11/13/14  9:29 PM  Result Value Ref Range   Glucose-Capillary 131 (H) 65 - 99 mg/dL   Comment 1 Notify RN   CBC with Differential/Platelet     Status: Abnormal   Collection Time: 11/14/14  4:16 AM  Result Value Ref Range   WBC 2.4 (L) 4.0 - 10.5 K/uL   RBC 4.06 3.87 - 5.11 MIL/uL   Hemoglobin 14.8 12.0 - 15.0 g/dL   HCT 43.5 36.0 - 46.0 %   MCV 107.1 (H) 78.0 - 100.0 fL   MCH 36.5 (H) 26.0 - 34.0 pg   MCHC 34.0 30.0 - 36.0 g/dL   RDW 13.6 11.5 - 15.5 %   Platelets 63 (L) 150 - 400 K/uL    Comment: REPEATED TO VERIFY SPECIMEN CHECKED FOR CLOTS CONSISTENT WITH PREVIOUS RESULT    Neutrophils Relative % 43 43 - 77 %   Neutro Abs 1.0 (L) 1.7 - 7.7 K/uL   Lymphocytes Relative 30 12 - 46 %   Lymphs Abs 0.7 0.7 - 4.0 K/uL   Monocytes Relative 24 (H) 3 - 12 %   Monocytes Absolute 0.6 0.1 - 1.0 K/uL   Eosinophils Relative 3 0 - 5 %   Eosinophils Absolute 0.1 0.0 - 0.7 K/uL   Basophils  Relative 0 0 - 1 %   Basophils Absolute 0.0 0.0 - 0.1 K/uL  Basic metabolic panel     Status: Abnormal   Collection Time: 11/14/14  4:16 AM  Result Value Ref Range   Sodium 135 135 - 145 mmol/L   Potassium 4.0 3.5 - 5.1 mmol/L   Chloride 96 (L) 101 - 111 mmol/L   CO2 33 (H) 22 - 32 mmol/L   Glucose, Bld 181 (H) 65 - 99 mg/dL   BUN 14 6 - 20 mg/dL   Creatinine, Ser 0.63 0.44 - 1.00  mg/dL   Calcium 9.2 8.9 - 10.3 mg/dL   GFR calc non Af Amer >60 >60 mL/min   GFR calc Af Amer >60 >60 mL/min    Comment: (NOTE) The eGFR has been calculated using the CKD EPI equation. This calculation has not been validated in all clinical situations. eGFR's persistently <60 mL/min signify possible Chronic Kidney Disease.    Anion gap 6 5 - 15  Hepatic function panel     Status: Abnormal   Collection Time: 11/14/14  4:16 AM  Result Value Ref Range   Total Protein 7.1 6.5 - 8.1 g/dL   Albumin 2.8 (L) 3.5 - 5.0 g/dL   AST 131 (H) 15 - 41 U/L   ALT 104 (H) 14 - 54 U/L   Alkaline Phosphatase 91 38 - 126 U/L   Total Bilirubin 1.9 (H) 0.3 - 1.2 mg/dL   Bilirubin, Direct 0.9 (H) 0.1 - 0.5 mg/dL   Indirect Bilirubin 1.0 (H) 0.3 - 0.9 mg/dL  Glucose, capillary     Status: Abnormal   Collection Time: 11/14/14  7:33 AM  Result Value Ref Range   Glucose-Capillary 137 (H) 65 - 99 mg/dL   Comment 1 Notify RN    Comment 2 Document in Chart     No results found.  Review of Systems  Constitutional: Positive for weight loss (4 lbs since last year, she told me 40 pounds at first.). Negative for fever, chills, malaise/fatigue and diaphoresis.       Cachectic female, NAD  HENT: Negative.   Eyes: Negative.   Respiratory: Positive for shortness of breath (DOE). Negative for cough, hemoptysis, sputum production and wheezing.   Cardiovascular: Negative.   Gastrointestinal: Positive for heartburn, abdominal pain (chronic pain for some time worst over the last 2 months), diarrhea (after constipation) and  constipation. Negative for blood in stool.  Genitourinary: Negative.   Musculoskeletal: Negative.   Skin: Negative.   Neurological: Negative.  Negative for weakness.  Endo/Heme/Allergies: Bruises/bleeds easily.  Psychiatric/Behavioral: Positive for depression (notes she is still very depressed) and substance abuse (NO Cocaine for at least a year, ETOH quit about 1.5 months ago, no DT's with withdrawal of ETOH). The patient is nervous/anxious.    Blood pressure 109/69, pulse 65, temperature 98.3 F (36.8 C), temperature source Oral, resp. rate 14, height _0  (1.575 m), weight 53.661 kg (118 lb 4.8 oz), last menstrual period 11/01/2011, SpO2 100 %. Physical Exam  Constitutional: She is oriented to person, place, and time. No distress.  Cachectic chronically ill female  HENT:  Head: Normocephalic.  Nose: Nose normal.  Eyes: Conjunctivae and EOM are normal. Right eye exhibits no discharge. Left eye exhibits no discharge. No scleral icterus.  Neck: No JVD present. No tracheal deviation present. No thyromegaly present.  Cardiovascular: Normal rate, regular rhythm, normal heart sounds and intact distal pulses.   No murmur heard. Respiratory: Effort normal and breath sounds normal. No respiratory distress. She has no wheezes. She has no rales. She exhibits no tenderness.  GI: Soft. Bowel sounds are normal. She exhibits distension (minimal ). She exhibits no mass. There is tenderness (Tender mostly RUQ,). There is no rebound and no guarding.  Musculoskeletal: She exhibits no edema.  Lymphadenopathy:    She has no cervical adenopathy.  Neurological: She is alert and oriented to person, place, and time. No cranial nerve deficit.  Skin: Skin is warm and dry. No rash noted. She is not diaphoretic. No erythema. No pallor.  Psychiatric: She has a normal  mood and affect. Her behavior is normal. Judgment and thought content normal.    Assessment/Plan: Chronic abdominal pain, worse over the last 2  months. New hepatic lobe capsular retraction, no bas, ill defined nodular chages Hepatitis C  Portal hypertension Cholelithiasis with normal HIDA 07/21/14. Thrombocytopenia/chronic LFT elevation Ongoing NSAID use Hx of ETOH use, quit 1.5 months ago, no DT's Hx of drug use, none x ~ 2 years Hx of pancreatitis GERD Hx of positive RA factor Major depressive disorder AODM Remote hx of tobacco use.  Plan;  Dr. Barry Dienes is going to talk to IR about biopsy, less concern about her gallstones as aggravating issue and more concerned with new changes to liver and a  possible carcinoma.  She is a poor surgical candidate because of her cirrhosis, low platelet count, and portal hypertension.  We will follow with you and make further recommendations.         Ariyanna Oien 11/14/2014, 12:31 PM

## 2014-11-14 NOTE — Progress Notes (Signed)
[facility] Progress Note  Subjective:    Pt is a 49 yo WF with known Laennec cirrhosis, portal hypertension, Hep C, diffuse abd. pain x 4 weeks. Known gallstones on sono with 5 mm gall bladder wall, CBD 6 mm, and 18% EF on HIDA 01/2014, but normal EF 06/2014. Mallory-Weiss tear 12/2013 EGD, colon polyp 01/2014. Last alcohol intake 1 month ago. Asked to see because of an abnormal appearing enhancement on MRCP Localized to RUL liver causing capsular retraction, liver appears cirrhotic, also splenomegaly and ascites.    Ca 19-9  62 ( nl 0-35) AFP 5.9 ( was 8.9 eleven months ago) Objective:  Vital signs in last 24 hours: Temp:  [98.3 F (36.8 C)-98.6 F (37 C)] 98.3 F (36.8 C) (07/25 0435) Pulse Rate:  [65-99] 65 (07/25 0435) Resp:  [14-16] 14 (07/25 0435) BP: (104-114)/(68-78) 109/69 mmHg (07/25 0435) SpO2:  [97 %-100 %] 100 % (07/25 0435) Last BM Date: 11/13/14 General:   Alert, well-developed, in NAD Heart:  Regular rate and rhythm; no murmurs Pulm;lungs clear Abdomen: tense, diffusely tender throughout, nl bowel sounds  Extremities:  Without edema. Neurologic: Alert and  oriented x4;  grossly normal neurologically. Psych: Alert and cooperative. Normal mood and affect.  Intake/Output from previous day: 07/24 0701 - 07/25 0700 In: 680 [P.O.:600; I.V.:80] Out: 3850 [Urine:3850] Intake/Output this shift:    Lab Results:  Recent Labs  11/12/14 0356 11/13/14 0458 11/14/14 0416  WBC 2.1* 2.2* 2.4*  HGB 14.5 14.2 14.8  HCT 43.5 42.9 43.5  PLT 70* 68* 63*   BMET  Recent Labs  11/12/14 0356 11/13/14 0458 11/14/14 0416  NA 133* 133* 135  K 4.1 4.3 4.0  CL 95* 93* 96*  CO2 34* 35* 33*  GLUCOSE 256* 281* 181*  BUN 10 12 14   CREATININE 0.61 0.70 0.63  CALCIUM 8.8* 8.9 9.2   LFT  Recent Labs  11/14/14 0416  PROT 7.1  ALBUMIN 2.8*  AST 131*  ALT 104*  ALKPHOS 91  BILITOT 1.9*  BILIDIR 0.9*  IBILI 1.0*      Mr 3d Recon At  Scanner  11/12/2014   CLINICAL DATA:  Abdominal pain, jaundice, pancreatitis. History of cirrhosis.  EXAM: MRI ABDOMEN WITHOUT AND WITH CONTRAST (INCLUDING MRCP)  TECHNIQUE: Multiplanar multisequence MR imaging of the abdomen was performed both before and after the administration of intravenous contrast. Heavily T2-weighted images of the biliary and pancreatic ducts were obtained, and three-dimensional MRCP images were rendered by post processing.  CONTRAST:  11mL MULTIHANCE GADOBENATE DIMEGLUMINE 529 MG/ML IV SOLN  COMPARISON:  CT abdomen/pelvis 11/07/2014, abdominal ultrasound 11/07/2014.  FINDINGS: Lower chest: Images are degraded due to inability of the patient to follow breath hold instructions and motion degrading multiple series.  Lung bases are grossly clear.  Hepatobiliary: Small perihepatic ascites is reidentified with nodular hepatic contour compatible with cirrhosis. Gallbladder wall thickening is reidentified measuring 5 mm. Dependent stone noted in within the gallbladder, image 50 series 11. No intra or extrahepatic ductal dilatation is identified. Maximal common duct diameter at its midportion measures 6 mm, with tapering to the ampulla. Allowing for motion artifact, no filling defect is identified within the nondilated common duct.  After administration of contrast, there is inhomogeneous enhancement at the periphery of the right upper lobe, image 21 series 1501, but no measurable mass or corresponding T2 weighted signal abnormality in these areas is identified. There is overlying capsular retraction which appears to have progressed from previous exam dating back  to at least 12/24/2013, allowing for differences in technique.  Pancreas: Trace peripancreatic fluid is identified but the pancreatic signal is homogeneous in intensity without focal abnormality. No pancreatic ductal dilatation.  Spleen: Splenomegaly noted without focal abnormality.  Adrenals/Urinary Tract: Adrenal glands are normal. 6 mm  left upper renal pole cortical cyst. No hydronephrosis.  Stomach/Bowel: Grossly normal  Vascular/Lymphatic: No aortic aneurysm.  Other: No lymphadenopathy allowing for motion and technique.  Musculoskeletal: Grossly normal  IMPRESSION: Evidence of cirrhosis with trace perihepatic ascites and splenomegaly which may indicate an element of portal hypertension.  When compared to prior exams, there has been interval development of right hepatic lobe capsular retraction upon a background of contour nodularity, with ill-defined early enhancement in this area but no measurable mass. This raises the question of cholangiocarcinoma. Consider consultation for possible image guided biopsy.  Gallstone with gallbladder wall thickening likely related to underlying cirrhosis given chronicity of this finding, less likely cholecystitis.  Allowing for extensive motion artifact, no intraductal filling defect or common or intrahepatic ductal dilatation.   Electronically Signed   By: Conchita Paris M.D.   On: 11/12/2014 11:42   Mr Abd W/wo Cm/mrcp  11/12/2014   CLINICAL DATA:  Abdominal pain, jaundice, pancreatitis. History of cirrhosis.  EXAM: MRI ABDOMEN WITHOUT AND WITH CONTRAST (INCLUDING MRCP)  TECHNIQUE: Multiplanar multisequence MR imaging of the abdomen was performed both before and after the administration of intravenous contrast. Heavily T2-weighted images of the biliary and pancreatic ducts were obtained, and three-dimensional MRCP images were rendered by post processing.  CONTRAST:  13mL MULTIHANCE GADOBENATE DIMEGLUMINE 529 MG/ML IV SOLN  COMPARISON:  CT abdomen/pelvis 11/07/2014, abdominal ultrasound 11/07/2014.  FINDINGS: Lower chest: Images are degraded due to inability of the patient to follow breath hold instructions and motion degrading multiple series.  Lung bases are grossly clear.  Hepatobiliary: Small perihepatic ascites is reidentified with nodular hepatic contour compatible with cirrhosis. Gallbladder wall  thickening is reidentified measuring 5 mm. Dependent stone noted in within the gallbladder, image 50 series 11. No intra or extrahepatic ductal dilatation is identified. Maximal common duct diameter at its midportion measures 6 mm, with tapering to the ampulla. Allowing for motion artifact, no filling defect is identified within the nondilated common duct.  After administration of contrast, there is inhomogeneous enhancement at the periphery of the right upper lobe, image 21 series 1501, but no measurable mass or corresponding T2 weighted signal abnormality in these areas is identified. There is overlying capsular retraction which appears to have progressed from previous exam dating back to at least 12/24/2013, allowing for differences in technique.  Pancreas: Trace peripancreatic fluid is identified but the pancreatic signal is homogeneous in intensity without focal abnormality. No pancreatic ductal dilatation.  Spleen: Splenomegaly noted without focal abnormality.  Adrenals/Urinary Tract: Adrenal glands are normal. 6 mm left upper renal pole cortical cyst. No hydronephrosis.  Stomach/Bowel: Grossly normal  Vascular/Lymphatic: No aortic aneurysm.  Other: No lymphadenopathy allowing for motion and technique.  Musculoskeletal: Grossly normal  IMPRESSION: Evidence of cirrhosis with trace perihepatic ascites and splenomegaly which may indicate an element of portal hypertension.  When compared to prior exams, there has been interval development of right hepatic lobe capsular retraction upon a background of contour nodularity, with ill-defined early enhancement in this area but no measurable mass. This raises the question of cholangiocarcinoma. Consider consultation for possible image guided biopsy.  Gallstone with gallbladder wall thickening likely related to underlying cirrhosis given chronicity of this finding, less likely cholecystitis.  Allowing  for extensive motion artifact, no intraductal filling defect or common  or intrahepatic ductal dilatation.   Electronically Signed   By: Conchita Paris M.D.   On: 11/12/2014 11:42    ASSESSMENT/PLAN:   49 yo female adm with abd pain and questionable lesion of right lobe of liver. Pt has hx Laennec cirrhosis, portal HTN, Hep C. AFP nl, CA 19-9 minimally elevated. Platelets= low at 63k, INR=1.32. Not certain pt would benefit from bx. May need repeat imaging in a few months. LFTs elevated but stable. Will review with attending as to further recommendations. If plan is to observe and repeat imaging, pt has a f/u scheduled in GI office in 4 weeks.     LOS: 7 days   Hvozdovic, Deloris Ping 11/14/2014, Pager [phone]     Attending physician's note   I have taken an interval history, reviewed the chart and examined the patient. I agree with the Advanced Practitioner's note, impression and recommendations. AFP is normal and CA19-9 is minimally elevated. She is at a higher risk for bleeding with liver biopsy given chronically low platelets, currently 63k. Mild ascites could also make liver biopsy higher risk. I favor following the hepatic abnormality with repeat CA19-9 and repeat imaging as outpatient with Dr. Doretha Sou. She has an appt in 4 weeks. GI signing off.   Pricilla Riffle. Fuller Plan, MD [facility]

## 2014-11-14 NOTE — Progress Notes (Signed)
Inpatient Diabetes Program Recommendations  AACE/ADA: New Consensus Statement on Inpatient Glycemic Control (2013)  Target Ranges:  Prepandial:   less than 140 mg/dL      Peak postprandial:   less than 180 mg/dL (1-2 hours)      Critically ill patients:  140 - 180 mg/dL    Results for [name], [name] (MRN [id number]) as of 11/14/2014 13:55  Ref. Range 11/13/2014 07:51 11/13/2014 11:26 11/13/2014 17:26 11/13/2014 21:29  Glucose-Capillary Latest Ref Range: 65-99 mg/dL 224 (H) 259 (H) 172 (H) 131 (H)   Results for [name], [name] (MRN [id number]) as of 11/14/2014 13:55  Ref. Range 11/14/2014 07:33 11/14/2014 12:23  Glucose-Capillary Latest Ref Range: 65-99 mg/dL 137 (H) 276 (H)    Home DM Meds: Lantus 15 units QHS       Novolog 5 units tidwc       Metformin 500 mg bid  Current DM Orders: Lantus 20 units QHS            Novolog 5 units tidwc            Novolog Sensitive SSI (0-9 units) TID AC + HS    -Eating well.  -Having postprandial glucose elevations.     MD- Please consider increasing Novolog Meal Coverage to 7 units tidwc    Will follow Wyn Quaker RN, MSN, CDE Diabetes Coordinator Inpatient Glycemic Control Team Team Pager: [phone] (8a-5p)

## 2014-11-15 LAB — GLUCOSE, CAPILLARY
Glucose-Capillary: 143 mg/dL — ABNORMAL HIGH (ref 65–99)
Glucose-Capillary: 261 mg/dL — ABNORMAL HIGH (ref 65–99)
Glucose-Capillary: 128 mg/dL — ABNORMAL HIGH (ref 65–99)

## 2014-11-15 MED ORDER — OXYCODONE HCL 5 MG PO TABS: 5.0000 mg | ORAL_TABLET | ORAL | Status: DC | PRN

## 2014-11-15 MED ORDER — FUROSEMIDE 40 MG PO TABS
40.0000 mg | ORAL_TABLET | Freq: Every day | ORAL | Status: DC
Start: 2014-11-15 — End: 2015-03-02

## 2014-11-15 MED ORDER — POTASSIUM CHLORIDE ER 10 MEQ PO TBCR: 10.0000 meq | EXTENDED_RELEASE_TABLET | Freq: Every day | ORAL | Status: DC

## 2014-11-15 MED ORDER — DOCUSATE SODIUM 100 MG PO CAPS: 100.0000 mg | ORAL_CAPSULE | Freq: Two times a day (BID) | ORAL | Status: DC

## 2014-11-15 MED ORDER — SPIRONOLACTONE 50 MG PO TABS: 50.0000 mg | ORAL_TABLET | Freq: Every day | ORAL | Status: DC

## 2014-11-15 NOTE — Progress Notes (Signed)
Patient ID: STACEYANN KNOUFF, female   DOB: 30-Jan-1966, 49 y.o.   MRN: [id number]    Subjective: Pt feels about the same today.    Objective: Vital signs in last 24 hours: Temp:  [97.9 F (36.6 C)-98.6 F (37 C)] 97.9 F (36.6 C) (07/26 0440) Pulse Rate:  [71-100] 100 (07/26 0440) Resp:  [14-16] 14 (07/26 0440) BP: (93-103)/(64-79) 103/79 mmHg (07/26 0440) SpO2:  [96 %-99 %] 96 % (07/26 0440) Last BM Date: 11/13/14  Intake/Output from previous day: 07/25 0701 - 07/26 0700 In: 540 [P.O.:540] Out: 1600 [Urine:1600] Intake/Output this shift:    PE: Abd: soft, diffusely tender, +BS, mild distention Heart: regular  Lab Results:   Recent Labs  11/13/14 0458 11/14/14 0416  WBC 2.2* 2.4*  HGB 14.2 14.8  HCT 42.9 43.5  PLT 68* 63*   BMET  Recent Labs  11/13/14 0458 11/14/14 0416  NA 133* 135  K 4.3 4.0  CL 93* 96*  CO2 35* 33*  GLUCOSE 281* 181*  BUN 12 14  CREATININE 0.70 0.63  CALCIUM 8.9 9.2   PT/INR No results for input(s): LABPROT, INR in the last 72 hours. CMP     Component Value Date/Time   NA 135 11/14/2014 0416   K 4.0 11/14/2014 0416   CL 96* 11/14/2014 0416   CO2 33* 11/14/2014 0416   GLUCOSE 181* 11/14/2014 0416   BUN 14 11/14/2014 0416   CREATININE 0.63 11/14/2014 0416   CALCIUM 9.2 11/14/2014 0416   PROT 7.1 11/14/2014 0416   ALBUMIN 2.8* 11/14/2014 0416   AST 131* 11/14/2014 0416   ALT 104* 11/14/2014 0416   ALKPHOS 91 11/14/2014 0416   BILITOT 1.9* 11/14/2014 0416   GFRNONAA >60 11/14/2014 0416   GFRAA >60 11/14/2014 0416   Lipase     Component Value Date/Time   LIPASE 57* 11/07/2014 0644       Studies/Results: No results found.  Anti-infectives: Anti-infectives    Start     Dose/Rate Route Frequency Ordered Stop   11/09/14 2000  piperacillin-tazobactam (ZOSYN) IVPB 3.375 g  Status:  Discontinued     3.375 g 12.5 mL/hr over 240 Minutes Intravenous 3 times per day 11/09/14 1811 11/13/14 0926   11/07/14 1015  cefTRIAXone  (ROCEPHIN) 1 g in dextrose 5 % 50 mL IVPB  Status:  Discontinued     1 g 100 mL/hr over 30 Minutes Intravenous  Once 11/07/14 1014 11/07/14 1046       Assessment/Plan   1. Hep C with cirrhosis and portal hypertension -patient does have gallstones, but her pain is unlikely to be secondary to this given its diffuse nature.  This is more likely secondary to her hep C and cirrhosis. -defer further care to GI or hepatology as an outpatient -they have decided against liver bx currently with routine surveillance per GI -no further surgical indications.  We will sign off.  LOS: 8 days    Armani Brar E 11/15/2014, 8:09 AM Pager: [phone]

## 2014-11-15 NOTE — Discharge Summary (Signed)
[name], is a 49 y.o. female  DOB 1965/07/28  MRN [id number].  Admission date:  11/07/2014  Admitting Physician  Nat Math, MD  Discharge Date:  11/15/2014   Primary MD  Cathlean Cower, MD  Recommendations for primary care physician for things to follow:  Please follow renal function in 1 week or so. Refer to GI for follow-up of abnormal MRCP.   Admission Diagnosis   Thrombocytopenia [D69.6] Constipated [K59.00] Abdominal pain, acute [R10.0] Calculus of gallbladder without cholecystitis without obstruction [K80.20] Elevated bilirubin [R17] Elevated transaminase level [R74.0] Abdominal pain [R10.9] Cirrhosis of liver with ascites, unspecified hepatic cirrhosis type [K74.60] Constipation, unspecified constipation type [K59.00] Non-intractable vomiting with nausea, vomiting of unspecified type [R11.2]   Discharge Diagnosis  Thrombocytopenia [D69.6] Constipated [K59.00] Abdominal pain, acute [R10.0] Calculus of gallbladder without cholecystitis without obstruction [K80.20] Elevated bilirubin [R17] Elevated transaminase level [R74.0] Abdominal pain [R10.9] Cirrhosis of liver with ascites, unspecified hepatic cirrhosis type [K74.60] Constipation, unspecified constipation type [K59.00] Non-intractable vomiting with nausea, vomiting of unspecified type [R11.2]   Active Problems:   DM (diabetes mellitus), type 2   MDD (major depressive disorder)   Cirrhosis of liver   Gallstones   Peritonitis with effusion   Elevated transaminase level   Abnormal CT scan, liver  Summary    [name] is a very pleasant 49 year old female with 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 She eventually had MRCP with findings concerning for malignancy and her CA-19-9 was slightly elevated. She also had cholelithiasis which was demonstrated to cause gallbladder dyskinesia in 2015. Patient was seen by GI/general surgery who both agreed that patient was not an ideal surgical candidate given ascites. Recommendation was to have her followed by GI. She received antibiotics which were discontinued when there was no sign of infection and she has reported manageable abdominal discomfort with narcotics which have been prescribed as needed. She was also placed on diuretics and will need renal function checked in the next 1-2 weeks. She will take laxatives to reduce risk for constipation. She will discharge home in the morning if no acute changes. Please refer to the comprehensive hospital course summary below for day to day developments. Hospital Course  11/09/14: [name] is a 49 y.o. 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 This is a constant pain with no exacerbating factors. It has been worse for a few days. She has been taking about 6 Ibuprofen for it daily. She has had chills but no fever. One episode of non-bloody vomiting yesterday but after this she was able to eat without trouble. No diarrhea- actually is constipated. Abdomen was distended with ascitic fluid- she states it feels like she was having twins. She cannot recall if she took the spironolactone that was  prescribed to her when discharged in May. CT did not show any acute pathology. Pain resolved after Dilaudid. Patient seems to have peritonitis as she still remains quite tender and she reports that antibody seem to help some. She admits to history of chills prior to admission. We will therefore put she is on Zosyn and reassess. Otherwise she reports that she is still  constipated. Will add lactulose. Will continue to replenish potassium as needed and change Lasix to oral. 11/10/14: Reports that pain is better and that she had a bowel movement. Antibiotic seem to be helping. Will continue antibiotic for now. 11/11/14: Says abdominal pain is better but not completely resolved. Has gallstones, ? Cholecystitis. Will obtain MRCP. Continue Zosyn for now. 11/12/14: Continued to have abdominal pain and elevated LFTs prompting MRCP which showed "Evidence of cirrhosis with trace perihepatic ascites and splenomegaly which may indicate an element of portal hypertension. When compared to prior exams, there has been interval development of right hepatic lobe capsular retraction upon a background of contour nodularity, with ill-defined early enhancement in this area but no measurable mass. This raises the question of cholangiocarcinoma. Consider consultation for possible image guided biopsy. Gallstone with gallbladder wall thickening likely related to underlying cirrhosis given chronicity of this finding, less likely cholecystitis. Allowing for extensive motion artifact, no intraductal filling defect or common or intrahepatic ductal dilatation". Will continue current antibiotic and consult to GI. Dr. Olevia Perches will graciously see patient in consult. Patient says pain is still there but somewhat better. 11/13/14: Appreciate GI. Patient continues to have abdominal pain despite antibiotic. BP borderline low. Will hold antibiotics and see response while awaiting tumor marker results-if fever, then need to consider cholecystitis, and drainage of his dose of Dilaudid/spironolactone in view of borderline low BP. 11/14/14: CA-19-9 elevated at 62, AFP normal. Patient still has abdominal pain. I agree with general surgery consultation-?Cholecystectomy. Spoke with we'll and general surgery will see patient in consult. Will continue to offset patient off antibiotic for now. 11/15/2014: Noted GI/general  surgery recommendations-no surgical intervention given increased risk.   Discharge Condition Stable.  Consults obtained  None  Follow UP Follow-up Information    Follow up On 11/14/2014.      Follow up with Nicoletta Ba, PA-C On 12/15/2014.   Specialty:  Gastroenterology   Why:  appy 10:00, please be there for 10:00.   Contact information:   [address] [place] [address] [phone]         Discharge Instructions  and  Discharge Medications  Discharge Instructions    Diet - low sodium heart healthy    Complete by:  As directed      Diet Carb Modified    Complete by:  As directed      Increase activity slowly    Complete by:  As directed             Medication List    STOP taking these medications        ibuprofen 200 MG tablet  Commonly known as:  ADVIL,MOTRIN     metFORMIN 500 MG tablet  Commonly known as:  GLUCOPHAGE      TAKE these medications        diphenhydrAMINE 25 MG tablet  Commonly known as:  BENADRYL  Take 50 mg by mouth at bedtime as needed for sleep.     esomeprazole 40 MG capsule  Commonly known as:  NEXIUM  Take 40 mg by mouth daily as needed (heartburn).  furosemide 40 MG tablet  Commonly known as:  LASIX  Take 1 tablet (40 mg total) by mouth daily.     insulin aspart 100 UNIT/ML injection  Commonly known as:  novoLOG  Inject 3 Units into the skin 3 (three) times daily before meals.     insulin glargine 100 UNIT/ML injection  Commonly known as:  LANTUS  Inject 0.15 mLs (15 Units total) into the skin at bedtime.     multivitamin with minerals Tabs tablet  Take 1 tablet by mouth daily.     oxyCODONE 5 MG immediate release tablet  Commonly known as:  Oxy IR/ROXICODONE  Take 1 tablet (5 mg total) by mouth every 4 (four) hours as needed for moderate pain.     potassium chloride 10 MEQ tablet  Commonly known as:  K-DUR  Take 1 tablet (10 mEq total) by mouth daily.     sertraline 100 MG tablet  Commonly known as:   ZOLOFT  Take 150 mg by mouth daily.     spironolactone 50 MG tablet  Commonly known as:  ALDACTONE  Take 1 tablet (50 mg total) by mouth daily.        Diet and Activity recommendation: See Discharge Instructions above  Major procedures and Radiology Reports - PLEASE review detailed and final reports for all details, in brief -    Ct Abdomen Pelvis Wo Contrast  11/07/2014   CLINICAL DATA:  Generalized abdominal pain and distention. Previously documented hepatic cirrhosis.  EXAM: CT ABDOMEN AND PELVIS WITHOUT CONTRAST  TECHNIQUE: Multidetector CT imaging of the abdomen and pelvis was performed following the standard protocol without IV contrast. Oral contrast was administered.  COMPARISON:  July 20, 2014  FINDINGS: There is mild bibasilar lung atelectasis. Lung bases are otherwise clear. There is a small hiatal hernia.  The liver again has a diffusely nodular opacity with enlargement of the caudate lobe consistent with cirrhosis. No focal liver lesions are identified on this noncontrast enhanced study. There is again noted cholelithiasis. The gallbladder wall is mildly thickened which may be due to nearby ascites, a finding also present on the previous study. There is ascites surrounding the liver and spleen. Ascites is also noted, moderate, lower in the abdomen and in the pelvis. There is no biliary duct dilatation. There are stable mildly prominent periportal lymph nodes, probably due to the underlying cirrhosis, a stable finding.  The spleen remains prominent measuring 15.6 x 12.6 x 5.2 cm, essentially stable from prior study. Perisplenic varices remain. No focal splenic lesions are identified on this noncontrast enhanced study.  Pancreas and adrenals appear within normal limits. Kidneys bilaterally show no mass or hydronephrosis on either side. There is no renal or ureteral calculus on either side.  In the pelvis, urinary bladder is midline with normal wall thickness. There is no pelvic mass  appreciable. The uterus and appendix are absent.  There is no bowel obstruction. There is no appreciable free air or portal venous air. There is no abscess apparent. No new lymph node prominence is identified compared to the prior study. There is no abdominal aortic aneurysm. There are no blastic or lytic bone lesions.  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 There is moderate ascites. There is cholelithiasis. Mild gallbladder wall thickening may well be due to the adjacent ascites.  No bowel obstruction apparent. No abscess. Uterus and appendix absent. No pancreatic lesions are appreciated on this study. Stable small hiatal hernia.   Electronically Signed   By:  Lowella Grip III M.D.   On: 11/07/2014 09:02   Mr 3d Recon At Scanner  11/12/2014   CLINICAL DATA:  Abdominal pain, jaundice, pancreatitis. History of cirrhosis.  EXAM: MRI ABDOMEN WITHOUT AND WITH CONTRAST (INCLUDING MRCP)  TECHNIQUE: Multiplanar multisequence MR imaging of the abdomen was performed both before and after the administration of intravenous contrast. Heavily T2-weighted images of the biliary and pancreatic ducts were obtained, and three-dimensional MRCP images were rendered by post processing.  CONTRAST:  75mL MULTIHANCE GADOBENATE DIMEGLUMINE 529 MG/ML IV SOLN  COMPARISON:  CT abdomen/pelvis 11/07/2014, abdominal ultrasound 11/07/2014.  FINDINGS: Lower chest: Images are degraded due to inability of the patient to follow breath hold instructions and motion degrading multiple series.  Lung bases are grossly clear.  Hepatobiliary: Small perihepatic ascites is reidentified with nodular hepatic contour compatible with cirrhosis. Gallbladder wall thickening is reidentified measuring 5 mm. Dependent stone noted in within the gallbladder, image 50 series 11. No intra or extrahepatic ductal dilatation is identified. Maximal common duct diameter at its midportion measures 6 mm, with tapering to the  ampulla. Allowing for motion artifact, no filling defect is identified within the nondilated common duct.  After administration of contrast, there is inhomogeneous enhancement at the periphery of the right upper lobe, image 21 series 1501, but no measurable mass or corresponding T2 weighted signal abnormality in these areas is identified. There is overlying capsular retraction which appears to have progressed from previous exam dating back to at least 12/24/2013, allowing for differences in technique.  Pancreas: Trace peripancreatic fluid is identified but the pancreatic signal is homogeneous in intensity without focal abnormality. No pancreatic ductal dilatation.  Spleen: Splenomegaly noted without focal abnormality.  Adrenals/Urinary Tract: Adrenal glands are normal. 6 mm left upper renal pole cortical cyst. No hydronephrosis.  Stomach/Bowel: Grossly normal  Vascular/Lymphatic: No aortic aneurysm.  Other: No lymphadenopathy allowing for motion and technique.  Musculoskeletal: Grossly normal  IMPRESSION: Evidence of cirrhosis with trace perihepatic ascites and splenomegaly which may indicate an element of portal hypertension.  When compared to prior exams, there has been interval development of right hepatic lobe capsular retraction upon a background of contour nodularity, with ill-defined early enhancement in this area but no measurable mass. This raises the question of cholangiocarcinoma. Consider consultation for possible image guided biopsy.  Gallstone with gallbladder wall thickening likely related to underlying cirrhosis given chronicity of this finding, less likely cholecystitis.  Allowing for extensive motion artifact, no intraductal filling defect or common or intrahepatic ductal dilatation.   Electronically Signed   By: Conchita Paris M.D.   On: 11/12/2014 11:42   US Paracentesis  11/07/2014   CLINICAL DATA:  Abdominal pain, ascites. Request diagnostic and therapeutic paracentesis.  EXAM: ULTRASOUND  GUIDED PARACENTESIS  COMPARISON:  None.  PROCEDURE: An ultrasound guided paracentesis was thoroughly discussed with the patient and questions answered. The benefits, risks, alternatives and complications were also discussed. The patient understands and wishes to proceed with the procedure. Written consent was obtained.  Ultrasound was performed to localize and mark an adequate pocket of fluid in the right lower quadrant of the abdomen. The area was then prepped and draped in the normal sterile fashion. 1% Lidocaine was used for local anesthesia. Under ultrasound guidance a 19 gauge Yueh catheter was introduced. Paracentesis was performed. The catheter was removed and a dressing applied.  COMPLICATIONS: None immediate  FINDINGS: A total of approximately 730 mL of clear, amber colored fluid was removed. A fluid sample was sent for laboratory  analysis.  IMPRESSION: Successful ultrasound guided paracentesis yielding 730 mL of ascites.  Read by: Ascencion Dike PA-C   Electronically Signed   By: Marybelle Killings M.D.   On: 11/07/2014 12:08   Dg Abd Acute W/chest  11/07/2014   CLINICAL DATA:  Two week history abdominal pain and bloating. History of cirrhosis and pancreatitis  EXAM: DG ABDOMEN ACUTE W/ 1V CHEST  COMPARISON:  Chest radiograph February 14, 2014; abdomen radiographs July 23, 2014  FINDINGS: PA chest: There is no edema or consolidation. The heart size and pulmonary vascularity are normal. No adenopathy.  Supine and upright abdomen: There is diffuse stool throughout much of the colon. There is no bowel dilatation or air-fluid level suggesting obstruction. No free air. There are apparent phleboliths in the pelvis.  IMPRESSION: Diffuse stool throughout much of colon. No obstruction or free air seen. No lung edema or consolidation.   Electronically Signed   By: Lowella Grip III M.D.   On: 11/07/2014 07:23   Dg Abd Portable 1v  11/09/2014   CLINICAL DATA:  49 year old female with constipation.  EXAM: PORTABLE  ABDOMEN - 1 VIEW  COMPARISON:  Radiograph dated 11/07/2014  FINDINGS: There is moderate stool throughout the colon. No evidence of bowel obstruction. No radiopaque calculi. No free air identified. Degenerative changes of spine. No acute fracture.  IMPRESSION: Constipation.  No evidence of bowel obstruction.   Electronically Signed   By: Anner Crete M.D.   On: 11/09/2014 00:53   Dg Abd Portable 1v  11/07/2014   CLINICAL DATA:  Two weeks of abdominal pain and constipation  EXAM: PORTABLE ABDOMEN - 1 VIEW  COMPARISON:  Abdominal and pelvic CT scan of November 07, 2014  FINDINGS: There are loops of mildly distended gas-filled small bowel in the mid abdomen. There is considerable contrast in the right colon from the CT scan. The colonic stool burden is mildly increased diffusely. No free extraluminal gas collections are demonstrated. There are phleboliths in the pelvis. The bony structures are unremarkable.  IMPRESSION: Nonspecific bowel gas pattern. Moderately increased colonic stool burden may reflect clinical constipation.   Electronically Signed   By: David  Martinique M.D.   On: 11/07/2014 13:44   Mr Abd W/wo Cm/mrcp  11/12/2014   CLINICAL DATA:  Abdominal pain, jaundice, pancreatitis. History of cirrhosis.  EXAM: MRI ABDOMEN WITHOUT AND WITH CONTRAST (INCLUDING MRCP)  TECHNIQUE: Multiplanar multisequence MR imaging of the abdomen was performed both before and after the administration of intravenous contrast. Heavily T2-weighted images of the biliary and pancreatic ducts were obtained, and three-dimensional MRCP images were rendered by post processing.  CONTRAST:  53mL MULTIHANCE GADOBENATE DIMEGLUMINE 529 MG/ML IV SOLN  COMPARISON:  CT abdomen/pelvis 11/07/2014, abdominal ultrasound 11/07/2014.  FINDINGS: Lower chest: Images are degraded due to inability of the patient to follow breath hold instructions and motion degrading multiple series.  Lung bases are grossly clear.  Hepatobiliary: Small perihepatic ascites  is reidentified with nodular hepatic contour compatible with cirrhosis. Gallbladder wall thickening is reidentified measuring 5 mm. Dependent stone noted in within the gallbladder, image 50 series 11. No intra or extrahepatic ductal dilatation is identified. Maximal common duct diameter at its midportion measures 6 mm, with tapering to the ampulla. Allowing for motion artifact, no filling defect is identified within the nondilated common duct.  After administration of contrast, there is inhomogeneous enhancement at the periphery of the right upper lobe, image 21 series 1501, but no measurable mass or corresponding T2 weighted signal abnormality in these  areas is identified. There is overlying capsular retraction which appears to have progressed from previous exam dating back to at least 12/24/2013, allowing for differences in technique.  Pancreas: Trace peripancreatic fluid is identified but the pancreatic signal is homogeneous in intensity without focal abnormality. No pancreatic ductal dilatation.  Spleen: Splenomegaly noted without focal abnormality.  Adrenals/Urinary Tract: Adrenal glands are normal. 6 mm left upper renal pole cortical cyst. No hydronephrosis.  Stomach/Bowel: Grossly normal  Vascular/Lymphatic: No aortic aneurysm.  Other: No lymphadenopathy allowing for motion and technique.  Musculoskeletal: Grossly normal  IMPRESSION: Evidence of cirrhosis with trace perihepatic ascites and splenomegaly which may indicate an element of portal hypertension.  When compared to prior exams, there has been interval development of right hepatic lobe capsular retraction upon a background of contour nodularity, with ill-defined early enhancement in this area but no measurable mass. This raises the question of cholangiocarcinoma. Consider consultation for possible image guided biopsy.  Gallstone with gallbladder wall thickening likely related to underlying cirrhosis given chronicity of this finding, less likely  cholecystitis.  Allowing for extensive motion artifact, no intraductal filling defect or common or intrahepatic ductal dilatation.   Electronically Signed   By: Conchita Paris M.D.   On: 11/12/2014 11:42    Micro Results   Recent Results (from the past 240 hour(s))  Culture, body fluid-bottle     Status: None   Collection Time: 11/07/14 11:40 AM  Result Value Ref Range Status   Specimen Description FLUID PERITONEAL  Final   Special Requests BOTTLES DRAWN AEROBIC AND ANAEROBIC 10CC  Final   Culture   Final    NO GROWTH 5 DAYS Performed at [facility]    Report Status 11/12/2014 FINAL  Final  Gram stain     Status: None   Collection Time: 11/07/14 11:40 AM  Result Value Ref Range Status   Specimen Description PERITONEAL  Final   Special Requests NONE  Final   Gram Stain   Final    WBC PRESENT,BOTH PMN AND MONONUCLEAR NO ORGANISMS SEEN Gram Stain Report Called to,Read Back By and Verified With: TORRES,D @ 1610 ON 11/07/14 POTEAT,S Performed at [facility]    Report Status 11/07/2014 FINAL  Final       Today   Subjective:   [name] today has no headache,no chest abdominal pain,no new weakness tingling or numbness, feels much better wants to go home today.   Objective:   Blood pressure 90/62, pulse 75, temperature 98.4 F (36.9 C), temperature source Oral, resp. rate 16, height 5\' 2"  (1.575 m), weight 53.661 kg (118 lb 4.8 oz), last menstrual period 11/01/2011, SpO2 100 %.   Intake/Output Summary (Last 24 hours) at 11/15/14 1921 Last data filed at 11/15/14 1430  Gross per 24 hour  Intake    420 ml  Output   1550 ml  Net  -1130 ml    Exam Awake Alert, Oriented x 3, No new F.N deficits, Normal affect [place].AT,PERRAL Supple Neck,No JVD, No cervical lymphadenopathy appriciated.  Symmetrical Chest wall movement, Good air movement bilaterally, CTAB RRR,No Gallops,Rubs or new Murmurs, No Parasternal Heave +ve B.Sounds, Abd Soft, Non tender, No  organomegaly appriciated, No rebound -guarding or rigidity. No Cyanosis, Clubbing or edema, No new Rash or bruise  Data Review   CBC w Diff: Lab Results  Component Value Date   WBC 2.4* 11/14/2014   HGB 14.8 11/14/2014   HCT 43.5 11/14/2014   PLT 63* 11/14/2014   LYMPHOPCT 30 11/14/2014  BANDSPCT 0 12/16/2010   MONOPCT 24* 11/14/2014   EOSPCT 3 11/14/2014   BASOPCT 0 11/14/2014    CMP: Lab Results  Component Value Date   NA 135 11/14/2014   K 4.0 11/14/2014   CL 96* 11/14/2014   CO2 33* 11/14/2014   BUN 14 11/14/2014   CREATININE 0.63 11/14/2014   PROT 7.1 11/14/2014   ALBUMIN 2.8* 11/14/2014   BILITOT 1.9* 11/14/2014   ALKPHOS 91 11/14/2014   AST 131* 11/14/2014   ALT 104* 11/14/2014  .   Total Time in preparing paper work, data evaluation and todays exam - 35 minutes  Felix Pratt M.D on 11/15/2014 at 7:21 PM  [facility] Office  [phone]

## 2014-11-16 LAB — GLUCOSE, CAPILLARY
GLUCOSE-CAPILLARY: 99 mg/dL (ref 65–99)
Glucose-Capillary: 117 mg/dL — ABNORMAL HIGH (ref 65–99)

## 2014-11-16 NOTE — Progress Notes (Signed)
Patient discharged by Dr. Sanjuana Letters 11/15/14. No acute changes overnight. Patient not seen.  Sherrita Riederer 11/16/2014 2:27 PM

## 2014-12-15 ENCOUNTER — Ambulatory Visit: Payer: Self-pay | Admitting: Physician Assistant

## 2014-12-20 ENCOUNTER — Encounter (HOSPITAL_COMMUNITY): Payer: Self-pay | Admitting: Emergency Medicine

## 2014-12-20 ENCOUNTER — Emergency Department (HOSPITAL_COMMUNITY): Payer: Medicaid Other

## 2014-12-20 ENCOUNTER — Emergency Department (HOSPITAL_COMMUNITY)
Admission: EM | Admit: 2014-12-20 | Discharge: 2014-12-21 | Disposition: A | Discharge status: Home / Self Care | Payer: Medicaid Other | Attending: Emergency Medicine | Admitting: Emergency Medicine

## 2014-12-20 DIAGNOSIS — R1030 Lower abdominal pain, unspecified: Secondary | ICD-10-CM | POA: Insufficient documentation

## 2014-12-20 DIAGNOSIS — F419 Anxiety disorder, unspecified: Secondary | ICD-10-CM | POA: Diagnosis not present

## 2014-12-20 DIAGNOSIS — G8929 Other chronic pain: Secondary | ICD-10-CM | POA: Insufficient documentation

## 2014-12-20 DIAGNOSIS — Z862 Personal history of diseases of the blood and blood-forming organs and certain disorders involving the immune mechanism: Secondary | ICD-10-CM | POA: Insufficient documentation

## 2014-12-20 DIAGNOSIS — F329 Major depressive disorder, single episode, unspecified: Secondary | ICD-10-CM | POA: Diagnosis not present

## 2014-12-20 DIAGNOSIS — K219 Gastro-esophageal reflux disease without esophagitis: Secondary | ICD-10-CM | POA: Insufficient documentation

## 2014-12-20 DIAGNOSIS — Z794 Long term (current) use of insulin: Secondary | ICD-10-CM | POA: Diagnosis not present

## 2014-12-20 DIAGNOSIS — Z79899 Other long term (current) drug therapy: Secondary | ICD-10-CM | POA: Insufficient documentation

## 2014-12-20 DIAGNOSIS — R140 Abdominal distension (gaseous): Secondary | ICD-10-CM | POA: Insufficient documentation

## 2014-12-20 DIAGNOSIS — Z90710 Acquired absence of both cervix and uterus: Secondary | ICD-10-CM | POA: Diagnosis not present

## 2014-12-20 DIAGNOSIS — Z9889 Other specified postprocedural states: Secondary | ICD-10-CM | POA: Diagnosis not present

## 2014-12-20 DIAGNOSIS — Z8619 Personal history of other infectious and parasitic diseases: Secondary | ICD-10-CM | POA: Diagnosis not present

## 2014-12-20 DIAGNOSIS — E1165 Type 2 diabetes mellitus with hyperglycemia: Secondary | ICD-10-CM | POA: Insufficient documentation

## 2014-12-20 DIAGNOSIS — R21 Rash and other nonspecific skin eruption: Secondary | ICD-10-CM | POA: Diagnosis present

## 2014-12-20 LAB — URINALYSIS, ROUTINE W REFLEX MICROSCOPIC
Bilirubin Urine: NEGATIVE
Glucose, UA: >1000 mg/dL — AB
Ketones, ur: 15 mg/dL — AB
LEUKOCYTES UA: NEGATIVE
NITRITE: NEGATIVE
PROTEIN: NEGATIVE mg/dL
Specific Gravity, Urine: >1.046 — ABNORMAL HIGH (ref 1.005–1.030)
UROBILINOGEN UA: 1 mg/dL (ref 0.0–1.0)
pH: 7 (ref 5.0–8.0)

## 2014-12-20 LAB — HEPATIC FUNCTION PANEL
ALBUMIN: 3.6 g/dL (ref 3.5–5.0)
ALT: 124 U/L — ABNORMAL HIGH (ref 14–54)
AST: 162 U/L — AB (ref 15–41)
Alkaline Phosphatase: 125 U/L (ref 38–126)
BILIRUBIN DIRECT: 0.9 mg/dL — AB (ref 0.1–0.5)
Indirect Bilirubin: 1 mg/dL — ABNORMAL HIGH (ref 0.3–0.9)
TOTAL PROTEIN: 8.3 g/dL — AB (ref 6.5–8.1)
Total Bilirubin: 1.9 mg/dL — ABNORMAL HIGH (ref 0.3–1.2)

## 2014-12-20 LAB — BASIC METABOLIC PANEL
ANION GAP: 13 (ref 5–15)
BUN: 8 mg/dL (ref 6–20)
CALCIUM: 9.2 mg/dL (ref 8.9–10.3)
CO2: 28 mmol/L (ref 22–32)
Chloride: 95 mmol/L — ABNORMAL LOW (ref 101–111)
Creatinine, Ser: 0.49 mg/dL (ref 0.44–1.00)
Glucose, Bld: 454 mg/dL — ABNORMAL HIGH (ref 65–99)
POTASSIUM: 4.6 mmol/L (ref 3.5–5.1)
Sodium: 136 mmol/L (ref 135–145)

## 2014-12-20 LAB — CBG MONITORING, ED
GLUCOSE-CAPILLARY: 457 mg/dL — AB (ref 65–99)
GLUCOSE-CAPILLARY: 383 mg/dL — AB (ref 65–99)
GLUCOSE-CAPILLARY: 310 mg/dL — AB (ref 65–99)
GLUCOSE-CAPILLARY: 227 mg/dL — AB (ref 65–99)

## 2014-12-20 LAB — URINE MICROSCOPIC-ADD ON

## 2014-12-20 LAB — CBC
HEMATOCRIT: 41.7 % (ref 36.0–46.0)
HEMOGLOBIN: 14.4 g/dL (ref 12.0–15.0)
MCH: 35.4 pg — ABNORMAL HIGH (ref 26.0–34.0)
MCHC: 34.5 g/dL (ref 30.0–36.0)
MCV: 102.5 fL — ABNORMAL HIGH (ref 78.0–100.0)
Platelets: 74 10*3/uL — ABNORMAL LOW (ref 150–400)
RBC: 4.07 MIL/uL (ref 3.87–5.11)
RDW: 13.7 % (ref 11.5–15.5)
WBC: 2.3 10*3/uL — ABNORMAL LOW (ref 4.0–10.5)

## 2014-12-20 LAB — ETHANOL: Alcohol, Ethyl (B): 292 mg/dL — ABNORMAL HIGH (ref <5)

## 2014-12-20 MED ORDER — HYDROMORPHONE HCL 1 MG/ML IJ SOLN
1.0000 mg | Freq: Once | INTRAMUSCULAR | Status: AC
  Administered 2014-12-20: 1 mg via INTRAVENOUS
  Filled 2014-12-20: qty 1

## 2014-12-20 MED ORDER — IOHEXOL 300 MG/ML  SOLN
25.0000 mL | Freq: Once | INTRAMUSCULAR | Status: AC | PRN
  Administered 2014-12-20: 25 mL via ORAL

## 2014-12-20 MED ORDER — SODIUM CHLORIDE 0.9 % IV BOLUS (SEPSIS)
500.0000 mL | Freq: Once | INTRAVENOUS | Status: AC
  Administered 2014-12-20: 500 mL via INTRAVENOUS

## 2014-12-20 MED ORDER — OXYCODONE HCL 5 MG PO TABS
5.0000 mg | ORAL_TABLET | Freq: Once | ORAL | Status: AC
  Administered 2014-12-20: 5 mg via ORAL
  Filled 2014-12-20: qty 1

## 2014-12-20 MED ORDER — IOHEXOL 300 MG/ML  SOLN
100.0000 mL | Freq: Once | INTRAMUSCULAR | Status: AC | PRN
  Administered 2014-12-20: 100 mL via INTRAVENOUS

## 2014-12-20 MED ORDER — INSULIN ASPART 100 UNIT/ML IV SOLN
7.0000 [IU] | Freq: Once | INTRAVENOUS | Status: AC
  Administered 2014-12-20: 7 [IU] via INTRAVENOUS
  Filled 2014-12-20: qty 0.07

## 2014-12-20 MED ORDER — HYDROMORPHONE HCL 1 MG/ML IJ SOLN
0.5000 mg | Freq: Once | INTRAMUSCULAR | Status: AC
  Administered 2014-12-20: 0.5 mg via INTRAVENOUS
  Filled 2014-12-20: qty 1

## 2014-12-20 NOTE — ED Provider Notes (Signed)
CSN: [id number]     Arrival date & time 12/20/14  1528 History   First MD Initiated Contact with Patient 12/20/14 1533     Chief Complaint  Patient presents with  . Rash    multiple red pinpoint areas to arms and legs  . Hyperglycemia    441 per EMS "been sky high"     (Consider location/radiation/quality/duration/timing/severity/associated sxs/prior Treatment) HPI Comments: Recent admission for abdominal pain with ascites.  Returns to ED today complaining of return of abdominal pain, new non-pruritic rash of upper extremities and right leg, and high blood sugar.  History of DM, states poorly controlled despite medication.  Patient is a 49 y.o. female presenting with rash and hyperglycemia. The history is provided by the patient and medical records. No language interpreter was used.  Rash Location:  Shoulder/arm and leg Shoulder/arm rash location:  L upper arm and R upper arm Leg rash location:  R upper leg Quality: painful   Quality: not itchy   Quality comment:  "stabbing" Pain details:    Quality:  Sharp and stinging Progression:  Worsening Chronicity:  New Associated symptoms: abdominal pain and fatigue   Abdominal pain:    Location:  Epigastric and LUQ   Quality:  Fullness   Severity:  Moderate   Onset quality:  Gradual   Duration:  2 days   Timing:  Constant   Progression:  Worsening   Chronicity:  Recurrent Hyperglycemia Associated symptoms: abdominal pain and fatigue     Past Medical History  Diagnosis Date  . Impaired glucose tolerance 11/07/2010  . Hepatitis C approx dx 2005    no tx to date  . Pancreatitis     HX of  . Alcohol dependency     at least [place] admissions 2007- 09/2013 for detox.   Marland Kitchen GERD (gastroesophageal reflux disease)   . Chronic constipation   . Gallstones   . Drug dependency     Hx of cocaine use  . Anxiety and depression 08/2011  . Rheumatoid factor positive   . Hemochromatosis     last phlebotomy tx ~ 2012. liver bx 2006  .  Pancytopenia   . Abnormal vaginal bleeding     uterine fibroid  . Gallstone 2006  . Chronic abdominal pain 2011  . Cirrhosis 11/09/2010  . Type II or unspecified type diabetes mellitus without mention of complication, uncontrolled 09/17/2011  . Major depression 11/2012, 09/2013    [facility] admissions for this, suicide attempt by OD Ambien (2014), Prozac (2015),ETOHism   . Anxiety    Past Surgical History  Procedure Laterality Date  . Appendectomy    . Cesarean section  x 2  . Foot surgery  2013  . Abdominal hysterectomy  12/17/2011    Procedure: HYSTERECTOMY ABDOMINAL;  Surgeon: Gus Height, MD;  Location: [facility];  Service: Gynecology;  Laterality: N/A;  . Salpingoophorectomy  12/17/2011    Procedure: SALPINGO OOPHERECTOMY;  Surgeon: Gus Height, MD;  Location: [facility];  Service: Gynecology;  Laterality: Bilateral;  . Esophagogastroduodenoscopy N/A 12/10/2013    Procedure: ESOPHAGOGASTRODUODENOSCOPY (EGD);  Surgeon: Ladene Artist, MD;  Location: [facility];  Service: Endoscopy;  Laterality: N/A;  . Percutaneous liver biopsy  2011  . Colonoscopy N/A 02/18/2014    Procedure: COLONOSCOPY;  Surgeon: Gatha Mayer, MD;  Location: [facility];  Service: Endoscopy;  Laterality: N/A;   Family History  Problem Relation Age of Onset  . Cancer Mother     ovarian  . Diabetes Father  Social History  Substance Use Topics  . Smoking status: Never Smoker   . Smokeless tobacco: Never Used  . Alcohol Use: Yes     Comment: one pint vodka daily years   OB History    Gravida Para Term Preterm AB TAB SAB Ectopic Multiple Living   2 2 2       3      Review of Systems  Constitutional: Positive for fatigue.  Gastrointestinal: Positive for abdominal pain.  Skin: Positive for rash.  All other systems reviewed and are negative.     Allergies  Betadine  Home Medications   Prior to Admission medications   Medication Sig Start Date End Date Taking? Authorizing Provider  diphenhydrAMINE  (BENADRYL) 25 MG tablet Take 50 mg by mouth at bedtime as needed for sleep.     Historical Provider, MD  docusate sodium (COLACE) 100 MG capsule Take 1 capsule (100 mg total) by mouth 2 (two) times daily. 11/15/14   Simbiso Ranga, MD  esomeprazole (NEXIUM) 40 MG capsule Take 40 mg by mouth daily as needed (heartburn).    Historical Provider, MD  furosemide (LASIX) 40 MG tablet Take 1 tablet (40 mg total) by mouth daily. 11/15/14   Simbiso Ranga, MD  insulin aspart (NOVOLOG) 100 UNIT/ML injection Inject 3 Units into the skin 3 (three) times daily before meals. Patient taking differently: Inject 5 Units into the skin 3 (three) times daily with meals.  07/26/14   Belkys A Regalado, MD  insulin glargine (LANTUS) 100 UNIT/ML injection Inject 0.15 mLs (15 Units total) into the skin at bedtime. Patient taking differently: Inject 15 Units into the skin at bedtime. At 10pm 07/26/14   Belkys A Regalado, MD  Multiple Vitamin (MULTIVITAMIN WITH MINERALS) TABS tablet Take 1 tablet by mouth daily.     Historical Provider, MD  oxyCODONE (OXY IR/ROXICODONE) 5 MG immediate release tablet Take 1 tablet (5 mg total) by mouth every 4 (four) hours as needed for moderate pain. 11/15/14   Simbiso Ranga, MD  potassium chloride (K-DUR) 10 MEQ tablet Take 1 tablet (10 mEq total) by mouth daily. 11/15/14   Simbiso Ranga, MD  sertraline (ZOLOFT) 100 MG tablet Take 150 mg by mouth daily.    Historical Provider, MD  spironolactone (ALDACTONE) 50 MG tablet Take 1 tablet (50 mg total) by mouth daily. 11/15/14   Nat Math, MD   LMP 11/01/2011 Physical Exam  Constitutional: She is oriented to person, place, and time. She appears well-developed and well-nourished.  HENT:  Head: Normocephalic.  Eyes: Conjunctivae are normal.  Neck: Neck supple.  Cardiovascular: Normal rate and regular rhythm.   Pulmonary/Chest: Effort normal and breath sounds normal.  Abdominal: Soft. She exhibits distension. There is tenderness.  Musculoskeletal:  She exhibits no edema or tenderness.  Lymphadenopathy:    She has no cervical adenopathy.  Neurological: She is alert and oriented to person, place, and time.  Skin: Skin is warm and dry.  Psychiatric: She has a normal mood and affect.  Nursing note and vitals reviewed.   ED Course  Procedures (including critical care time) Labs Review Labs Reviewed  BASIC METABOLIC PANEL - Abnormal; Notable for the following:    Chloride 95 (*)    Glucose, Bld 454 (*)    All other components within normal limits  CBC - Abnormal; Notable for the following:    WBC 2.3 (*)    MCV 102.5 (*)    MCH 35.4 (*)    Platelets 74 (*)  All other components within normal limits  URINALYSIS, ROUTINE W REFLEX MICROSCOPIC (NOT AT [facility]) - Abnormal; Notable for the following:    Specific Gravity, Urine >1.046 (*)    Glucose, UA >1000 (*)    Hgb urine dipstick TRACE (*)    Ketones, ur 15 (*)    All other components within normal limits  HEPATIC FUNCTION PANEL - Abnormal; Notable for the following:    Total Protein 8.3 (*)    AST 162 (*)    ALT 124 (*)    Total Bilirubin 1.9 (*)    Bilirubin, Direct 0.9 (*)    Indirect Bilirubin 1.0 (*)    All other components within normal limits  ETHANOL - Abnormal; Notable for the following:    Alcohol, Ethyl (B) 292 (*)    All other components within normal limits  URINE MICROSCOPIC-ADD ON - Abnormal; Notable for the following:    Squamous Epithelial / LPF MANY (*)    All other components within normal limits  CBG MONITORING, ED - Abnormal; Notable for the following:    Glucose-Capillary 457 (*)    All other components within normal limits  CBG MONITORING, ED - Abnormal; Notable for the following:    Glucose-Capillary 383 (*)    All other components within normal limits  CBG MONITORING, ED - Abnormal; Notable for the following:    Glucose-Capillary 310 (*)    All other components within normal limits  CBG MONITORING, ED - Abnormal; Notable for the following:     Glucose-Capillary 227 (*)    All other components within normal limits    Imaging Review Ct Abdomen Pelvis W Contrast  12/20/2014   CLINICAL DATA:  Progressive rash involving the arms and legs for 3 days. Hyperglycemia with occasional vomiting. History of cirrhosis. Initial encounter.  EXAM: CT ABDOMEN AND PELVIS WITH CONTRAST  TECHNIQUE: Multidetector CT imaging of the abdomen and pelvis was performed using the standard protocol following bolus administration of intravenous contrast.  CONTRAST:  41mL OMNIPAQUE IOHEXOL 300 MG/ML SOLN, 121mL OMNIPAQUE IOHEXOL 300 MG/ML SOLN  COMPARISON:  MRI 11/12/2014.  CT 11/07/2014.  FINDINGS: Lower chest: Clear lung bases. No significant pleural or pericardial effusion.  Hepatobiliary: Again demonstrated are diffuse changes of cirrhosis with contour irregularity and capsular contraction inferiorly in the right lobe. The liver appears heterogeneous on the delayed post-contrast images, but no focal lesion or abnormal enhancement identified. Stable small calcified gallstone and gallbladder wall thickening, likely related to chronic liver disease. No significant biliary dilatation.  Pancreas: Unremarkable. No pancreatic ductal dilatation or surrounding inflammatory changes.  Spleen: Stable mild splenomegaly with the spleen measuring up to 13.6 cm in height. No focal abnormality identified.  Adrenals/Urinary Tract: Both adrenal glands appear normal. There are tiny nonobstructing calculi in the upper and lower poles of the left kidney. There is a tiny cyst in the upper pole of the left kidney. The right kidney appears normal. No evidence of hydronephrosis or ureteral calculus. The bladder appears normal.  Stomach/Bowel: No evidence of bowel wall thickening, distention or surrounding inflammatory change. Retrocecal surgical clips consistent with prior appendectomy. Moderate stool throughout the colon.  Vascular/Lymphatic: Stable prominent lymph nodes within the porta hepatis  attributed to chronic liver disease. No retroperitoneal lymphadenopathy. Stable mild aortoiliac atherosclerosis and small perisplenic varices.  Reproductive: Hysterectomy.  No evidence of adnexal mass.  Other: Ascites has improved compared with the prior CT with minimal residual perihepatic fluid.  Musculoskeletal: No acute or significant osseous findings.  IMPRESSION: 1. No  acute findings or explanation for the patient's symptoms. 2. Generally stable appearance of the liver with diffuse contour irregularity and heterogeneity consistent with cirrhosis. No focal lesion identified. 3. Cholelithiasis and chronic gallbladder wall thickening, likely related to chronic liver disease. 4. Splenomegaly and perisplenic varices again noted. Ascites has improved.   Electronically Signed   By: Richardean Sale M.D.   On: 12/20/2014 19:52     Previous imaging reviewed: MR abdomen with MRCP 11/12/14 IMPRESSION: Evidence of cirrhosis with trace perihepatic ascites and splenomegaly which may indicate an element of portal hypertension.  When compared to prior exams, there has been interval development of right hepatic lobe capsular retraction upon a background of contour nodularity, with ill-defined early enhancement in this area but no measurable mass. This raises the question of cholangiocarcinoma. Consider consultation for possible image guided biopsy.  Gallstone with gallbladder wall thickening likely related to underlying cirrhosis given chronicity of this finding, less likely cholecystitis.  Allowing for extensive motion artifact, no intraductal filling defect or common or intrahepatic ductal dilatation.   Electronically Signed  By: Conchita Paris M.D.  On: 11/12/2014 11:42  I have personally reviewed and evaluated these images and lab results as part of my medical decision-making.   EKG Interpretation None     Lab and radiology results reviewed and shared with patient. Hyperglycemia  treated with fluids and insulin. No acute findings on today's CT of abdomen. Patient counseled on importance of stopping alcohol usage in setting of cirrhosis. Patient has a scheduled follow-up appointment with GI in two weeks. Recommend follow-up with PCP. Return precautions discussed. MDM   Final diagnoses:  None    Abdominal pain.    Etta Quill, NP 12/21/14 [id number]  Sherwood Gambler, MD 12/24/14 [phone]

## 2014-12-20 NOTE — ED Notes (Signed)
Pt is aware urine sample needed.  

## 2014-12-20 NOTE — ED Notes (Signed)
Pt A+Ox4, reports rash to arms/leg x3 days, first started on arms, now worsening and spreading to RLE.  Pt denies itching, reports pain "like stabbing me with a needle".  Numerous red, pinpoint spots noted to arms and RLE.  Pt denies exposure.  Pt also reports "sugars have been sky high for a while now".  +vomiting occasionally, denies nausea.  Skin otherwise PWD.  MAEI, ambulatory with steady gait.  Speaking full/clear sentences, rr eve/un-lab.  NAD.

## 2014-12-20 NOTE — ED Notes (Signed)
Patient transported to CT 

## 2014-12-20 NOTE — ED Notes (Signed)
Pt given a sandwich

## 2014-12-20 NOTE — ED Notes (Signed)
Patient will call out when ready to give sample

## 2014-12-21 MED ORDER — OXYCODONE HCL 5 MG PO TABS: 5.0000 mg | ORAL_TABLET | Freq: Four times a day (QID) | ORAL | Status: DC | PRN

## 2014-12-21 NOTE — Discharge Instructions (Signed)
Abdominal Pain, Women °Abdominal (stomach, pelvic, or belly) pain can be caused by many things. It is important to tell your doctor: °· The location of the pain. °· Does it come and go or is it present all the time? °· Are there things that start the pain (eating certain foods, exercise)? °· Are there other symptoms associated with the pain (fever, nausea, vomiting, diarrhea)? °All of this is helpful to know when trying to find the cause of the pain. °CAUSES  °· Stomach: virus or bacteria infection, or ulcer. °· Intestine: appendicitis (inflamed appendix), regional ileitis (Crohn's disease), ulcerative colitis (inflamed colon), irritable bowel syndrome, diverticulitis (inflamed diverticulum of the colon), or cancer of the stomach or intestine. °· Gallbladder disease or stones in the gallbladder. °· Kidney disease, kidney stones, or infection. °· Pancreas infection or cancer. °· Fibromyalgia (pain disorder). °· Diseases of the female organs: °¨ Uterus: fibroid (non-cancerous) tumors or infection. °¨ Fallopian tubes: infection or tubal pregnancy. °¨ Ovary: cysts or tumors. °¨ Pelvic adhesions (scar tissue). °¨ Endometriosis (uterus lining tissue growing in the pelvis and on the pelvic organs). °¨ Pelvic congestion syndrome (female organs filling up with blood just before the menstrual period). °¨ Pain with the menstrual period. °¨ Pain with ovulation (producing an egg). °¨ Pain with an IUD (intrauterine device, birth control) in the uterus. °¨ Cancer of the female organs. °· Functional pain (pain not caused by a disease, may improve without treatment). °· Psychological pain. °· Depression. °DIAGNOSIS  °Your doctor will decide the seriousness of your pain by doing an examination. °· Blood tests. °· X-rays. °· Ultrasound. °· CT scan (computed tomography, special type of X-ray). °· MRI (magnetic resonance imaging). °· Cultures, for infection. °· Barium enema (dye inserted in the large intestine, to better view it with  X-rays). °· Colonoscopy (looking in intestine with a lighted tube). °· Laparoscopy (minor surgery, looking in abdomen with a lighted tube). °· Major abdominal exploratory surgery (looking in abdomen with a large incision). °TREATMENT  °The treatment will depend on the cause of the pain.  °· Many cases can be observed and treated at home. °· Over-the-counter medicines recommended by your caregiver. °· Prescription medicine. °· Antibiotics, for infection. °· Birth control pills, for painful periods or for ovulation pain. °· Hormone treatment, for endometriosis. °· Nerve blocking injections. °· Physical therapy. °· Antidepressants. °· Counseling with a psychologist or psychiatrist. °· Minor or major surgery. °HOME CARE INSTRUCTIONS  °· Do not take laxatives, unless directed by your caregiver. °· Take over-the-counter pain medicine only if ordered by your caregiver. Do not take aspirin because it can cause an upset stomach or bleeding. °· Try a clear liquid diet (broth or water) as ordered by your caregiver. Slowly move to a bland diet, as tolerated, if the pain is related to the stomach or intestine. °· Have a thermometer and take your temperature several times a day, and record it. °· Bed rest and sleep, if it helps the pain. °· Avoid sexual intercourse, if it causes pain. °· Avoid stressful situations. °· Keep your follow-up appointments and tests, as your caregiver orders. °· If the pain does not go away with medicine or surgery, you may try: °¨ Acupuncture. °¨ Relaxation exercises (yoga, meditation). °¨ Group therapy. °¨ Counseling. °SEEK MEDICAL CARE IF:  °· You notice certain foods cause stomach pain. °· Your home care treatment is not helping your pain. °· You need stronger pain medicine. °· You want your IUD removed. °· You feel faint or   lightheaded. °· You develop nausea and vomiting. °· You develop a rash. °· You are having side effects or an allergy to your medicine. °SEEK IMMEDIATE MEDICAL CARE IF:  °· Your  pain does not go away or gets worse. °· You have a fever. °· Your pain is felt only in portions of the abdomen. The right side could possibly be appendicitis. The left lower portion of the abdomen could be colitis or diverticulitis. °· You are passing blood in your stools (bright red or black tarry stools, with or without vomiting). °· You have blood in your urine. °· You develop chills, with or without a fever. °· You pass out. °MAKE SURE YOU:  °· Understand these instructions. °· Will watch your condition. °· Will get help right away if you are not doing well or get worse. °Document Released: 02/03/2007 Document Revised: 08/23/2013 Document Reviewed: 02/23/2009 °[employer]® Patient Information ©2015 [employer]. This information is not intended to replace advice given to you by your health care provider. Make sure you discuss any questions you have with your health care provider. ° °

## 2015-01-02 ENCOUNTER — Encounter (HOSPITAL_COMMUNITY): Payer: Self-pay | Admitting: Emergency Medicine

## 2015-01-02 ENCOUNTER — Emergency Department (HOSPITAL_COMMUNITY): Payer: Medicaid Other

## 2015-01-02 ENCOUNTER — Observation Stay (HOSPITAL_COMMUNITY)
Admission: EM | Admit: 2015-01-02 | Discharge: 2015-01-04 | Disposition: A | Discharge status: Home / Self Care | Payer: Medicaid Other | Attending: Internal Medicine | Admitting: Internal Medicine

## 2015-01-02 DIAGNOSIS — R110 Nausea: Secondary | ICD-10-CM

## 2015-01-02 DIAGNOSIS — K5909 Other constipation: Secondary | ICD-10-CM | POA: Insufficient documentation

## 2015-01-02 DIAGNOSIS — E119 Type 2 diabetes mellitus without complications: Secondary | ICD-10-CM | POA: Diagnosis not present

## 2015-01-02 DIAGNOSIS — Y844 Aspiration of fluid as the cause of abnormal reaction of the patient, or of later complication, without mention of misadventure at the time of the procedure: Secondary | ICD-10-CM | POA: Insufficient documentation

## 2015-01-02 DIAGNOSIS — F419 Anxiety disorder, unspecified: Secondary | ICD-10-CM | POA: Insufficient documentation

## 2015-01-02 DIAGNOSIS — E871 Hypo-osmolality and hyponatremia: Secondary | ICD-10-CM | POA: Diagnosis not present

## 2015-01-02 DIAGNOSIS — R188 Other ascites: Principal | ICD-10-CM | POA: Insufficient documentation

## 2015-01-02 DIAGNOSIS — R109 Unspecified abdominal pain: Secondary | ICD-10-CM

## 2015-01-02 DIAGNOSIS — F1020 Alcohol dependence, uncomplicated: Secondary | ICD-10-CM | POA: Diagnosis not present

## 2015-01-02 DIAGNOSIS — D72819 Decreased white blood cell count, unspecified: Secondary | ICD-10-CM | POA: Insufficient documentation

## 2015-01-02 DIAGNOSIS — L7621 Postprocedural hemorrhage and hematoma of skin and subcutaneous tissue following a dermatologic procedure: Secondary | ICD-10-CM | POA: Diagnosis not present

## 2015-01-02 DIAGNOSIS — Z23 Encounter for immunization: Secondary | ICD-10-CM | POA: Insufficient documentation

## 2015-01-02 DIAGNOSIS — D61818 Other pancytopenia: Secondary | ICD-10-CM | POA: Diagnosis not present

## 2015-01-02 DIAGNOSIS — K766 Portal hypertension: Secondary | ICD-10-CM | POA: Insufficient documentation

## 2015-01-02 DIAGNOSIS — F329 Major depressive disorder, single episode, unspecified: Secondary | ICD-10-CM | POA: Insufficient documentation

## 2015-01-02 DIAGNOSIS — Z6820 Body mass index (BMI) 20.0-20.9, adult: Secondary | ICD-10-CM | POA: Diagnosis not present

## 2015-01-02 DIAGNOSIS — Z794 Long term (current) use of insulin: Secondary | ICD-10-CM | POA: Diagnosis not present

## 2015-01-02 DIAGNOSIS — R161 Splenomegaly, not elsewhere classified: Secondary | ICD-10-CM | POA: Insufficient documentation

## 2015-01-02 DIAGNOSIS — E876 Hypokalemia: Secondary | ICD-10-CM | POA: Diagnosis not present

## 2015-01-02 DIAGNOSIS — R531 Weakness: Secondary | ICD-10-CM

## 2015-01-02 DIAGNOSIS — K7460 Unspecified cirrhosis of liver: Secondary | ICD-10-CM | POA: Insufficient documentation

## 2015-01-02 DIAGNOSIS — E43 Unspecified severe protein-calorie malnutrition: Secondary | ICD-10-CM | POA: Diagnosis not present

## 2015-01-02 DIAGNOSIS — B1710 Acute hepatitis C without hepatic coma: Secondary | ICD-10-CM | POA: Insufficient documentation

## 2015-01-02 DIAGNOSIS — K219 Gastro-esophageal reflux disease without esophagitis: Secondary | ICD-10-CM | POA: Insufficient documentation

## 2015-01-02 DIAGNOSIS — D696 Thrombocytopenia, unspecified: Secondary | ICD-10-CM | POA: Diagnosis not present

## 2015-01-02 LAB — CBC
HCT: 42.4 % (ref 36.0–46.0)
HEMOGLOBIN: 14.8 g/dL (ref 12.0–15.0)
MCH: 36.4 pg — ABNORMAL HIGH (ref 26.0–34.0)
MCHC: 34.9 g/dL (ref 30.0–36.0)
MCV: 104.2 fL — ABNORMAL HIGH (ref 78.0–100.0)
PLATELETS: 72 10*3/uL — AB (ref 150–400)
RBC: 4.07 MIL/uL (ref 3.87–5.11)
RDW: 14.6 % (ref 11.5–15.5)
WBC: 3.9 10*3/uL — AB (ref 4.0–10.5)

## 2015-01-02 LAB — CBG MONITORING, ED: GLUCOSE-CAPILLARY: 351 mg/dL — AB (ref 65–99)

## 2015-01-02 LAB — COMPREHENSIVE METABOLIC PANEL
ALK PHOS: 115 U/L (ref 38–126)
ALT: 97 U/L — AB (ref 14–54)
ANION GAP: 10 (ref 5–15)
AST: 127 U/L — ABNORMAL HIGH (ref 15–41)
Albumin: 3.1 g/dL — ABNORMAL LOW (ref 3.5–5.0)
BUN: 12 mg/dL (ref 6–20)
CALCIUM: 8.8 mg/dL — AB (ref 8.9–10.3)
CHLORIDE: 91 mmol/L — AB (ref 101–111)
CO2: 28 mmol/L (ref 22–32)
CREATININE: 0.61 mg/dL (ref 0.44–1.00)
Glucose, Bld: 486 mg/dL — ABNORMAL HIGH (ref 65–99)
Potassium: 3.7 mmol/L (ref 3.5–5.1)
Sodium: 129 mmol/L — ABNORMAL LOW (ref 135–145)
Total Bilirubin: 2 mg/dL — ABNORMAL HIGH (ref 0.3–1.2)
Total Protein: 7.5 g/dL (ref 6.5–8.1)

## 2015-01-02 LAB — URINALYSIS, ROUTINE W REFLEX MICROSCOPIC
Bilirubin Urine: NEGATIVE
Glucose, UA: >1000 mg/dL — AB
Hgb urine dipstick: NEGATIVE
KETONES UR: 15 mg/dL — AB
LEUKOCYTES UA: NEGATIVE
NITRITE: NEGATIVE
PROTEIN: NEGATIVE mg/dL
Specific Gravity, Urine: 1.04 — ABNORMAL HIGH (ref 1.005–1.030)
Urobilinogen, UA: 1 mg/dL (ref 0.0–1.0)
pH: 7.5 (ref 5.0–8.0)

## 2015-01-02 LAB — PROTIME-INR
INR: 1.22 (ref 0.00–1.49)
PROTHROMBIN TIME: 15.6 s — AB (ref 11.6–15.2)

## 2015-01-02 LAB — LIPASE, BLOOD: LIPASE: 79 U/L — AB (ref 22–51)

## 2015-01-02 LAB — URINE MICROSCOPIC-ADD ON

## 2015-01-02 LAB — I-STAT CG4 LACTIC ACID, ED
LACTIC ACID, VENOUS: 1.74 mmol/L (ref 0.5–2.0)
LACTIC ACID, VENOUS: 1.01 mmol/L (ref 0.5–2.0)

## 2015-01-02 LAB — APTT: aPTT: 29 seconds (ref 24–37)

## 2015-01-02 MED ORDER — IOHEXOL 300 MG/ML  SOLN: 50.0000 mL | Freq: Once | INTRAMUSCULAR | Status: DC | PRN

## 2015-01-02 MED ORDER — ONDANSETRON HCL 4 MG/2ML IJ SOLN
4.0000 mg | INTRAMUSCULAR | Status: AC
  Administered 2015-01-02: 4 mg via INTRAVENOUS
  Filled 2015-01-02: qty 2

## 2015-01-02 MED ORDER — IOHEXOL 300 MG/ML  SOLN
100.0000 mL | Freq: Once | INTRAMUSCULAR | Status: AC | PRN
  Administered 2015-01-02: 100 mL via INTRAVENOUS

## 2015-01-02 MED ORDER — HYDROMORPHONE HCL 1 MG/ML IJ SOLN
1.0000 mg | Freq: Once | INTRAMUSCULAR | Status: AC
  Administered 2015-01-03: 1 mg via INTRAVENOUS
  Filled 2015-01-02: qty 1

## 2015-01-02 MED ORDER — INSULIN ASPART 100 UNIT/ML ~~LOC~~ SOLN
8.0000 [IU] | Freq: Once | SUBCUTANEOUS | Status: AC
  Administered 2015-01-03: 8 [IU] via SUBCUTANEOUS
  Filled 2015-01-02: qty 1

## 2015-01-02 MED ORDER — HYDROMORPHONE HCL 1 MG/ML IJ SOLN
1.0000 mg | Freq: Once | INTRAMUSCULAR | Status: AC
  Administered 2015-01-02: 1 mg via INTRAVENOUS
  Filled 2015-01-02: qty 1

## 2015-01-02 NOTE — ED Provider Notes (Signed)
CSN: [id number]     Arrival date & time 01/02/15  1708 History   First MD Initiated Contact with Patient 01/02/15 2010     Chief Complaint  Patient presents with  . Abdominal Pain     HPI   [name] is a 49 y.o. female with a PMH of hepatitis C, pancreatitis, polysubstance abuse, cirrhosis who presents to the ED with abdominal pain x 1 week. She reports constant, diffuse abdominal pain that radiates to her back and worsening abdominal distention over the past week. She denies exacerbating or alleviating factors. She reports associated nausea and emesis as well as generalized weakness. She denies fever, though reports chills. She denies diarrhea, constipation, headache, lightheadedness, dizziness, loss of consciousness, chest pain, shortness of breath, dysuria, urgency, frequency.   Past Medical History  Diagnosis Date  . Impaired glucose tolerance 11/07/2010  . Hepatitis C approx dx 2005    no tx to date  . Pancreatitis     HX of  . Alcohol dependency     at least [facility] admissions 2007- 09/2013 for detox.   Marland Kitchen GERD (gastroesophageal reflux disease)   . Chronic constipation   . Gallstones   . Drug dependency     Hx of cocaine use  . Anxiety and depression 08/2011  . Rheumatoid factor positive   . Hemochromatosis     last phlebotomy tx ~ 2012. liver bx 2006  . Pancytopenia   . Abnormal vaginal bleeding     uterine fibroid  . Gallstone 2006  . Chronic abdominal pain 2011  . Cirrhosis 11/09/2010  . Type II or unspecified type diabetes mellitus without mention of complication, uncontrolled 09/17/2011  . Major depression 11/2012, 09/2013    [facility] admissions for this, suicide attempt by OD Ambien (2014), Prozac (2015),ETOHism   . Anxiety    Past Surgical History  Procedure Laterality Date  . Appendectomy    . Cesarean section  x 2  . Foot surgery  2013  . Abdominal hysterectomy  12/17/2011    Procedure: HYSTERECTOMY ABDOMINAL;  Surgeon: Gus Height, MD;  Location: [facility];   Service: Gynecology;  Laterality: N/A;  . Salpingoophorectomy  12/17/2011    Procedure: SALPINGO OOPHERECTOMY;  Surgeon: Gus Height, MD;  Location: [facility];  Service: Gynecology;  Laterality: Bilateral;  . Esophagogastroduodenoscopy N/A 12/10/2013    Procedure: ESOPHAGOGASTRODUODENOSCOPY (EGD);  Surgeon: Ladene Artist, MD;  Location: [facility];  Service: Endoscopy;  Laterality: N/A;  . Percutaneous liver biopsy  2011  . Colonoscopy N/A 02/18/2014    Procedure: COLONOSCOPY;  Surgeon: Gatha Mayer, MD;  Location: [facility];  Service: Endoscopy;  Laterality: N/A;   Family History  Problem Relation Age of Onset  . Cancer Mother     ovarian  . Diabetes Father    Social History  Substance Use Topics  . Smoking status: Never Smoker   . Smokeless tobacco: Never Used  . Alcohol Use: Yes     Comment: one pint vodka daily years   OB History    Gravida Para Term Preterm AB TAB SAB Ectopic Multiple Living   2 2 2       3       Review of Systems  Constitutional: Positive for chills and fatigue. Negative for fever.  Respiratory: Negative for shortness of breath.   Cardiovascular: Negative for chest pain, palpitations and leg swelling.  Gastrointestinal: Positive for nausea, vomiting, abdominal pain and abdominal distention. Negative for diarrhea and constipation.  Genitourinary: Negative for  dysuria, urgency and frequency.  Musculoskeletal: Positive for back pain. Negative for neck pain and neck stiffness.  Skin: Negative for color change, pallor, rash and wound.  Neurological: Positive for weakness. Negative for dizziness, syncope, light-headedness, numbness and headaches.       Reports generalized weakness.  All other systems reviewed and are negative.     Allergies  Betadine  Home Medications   Prior to Admission medications   Medication Sig Start Date End Date Taking? Authorizing Provider  diphenhydrAMINE (BENADRYL) 25 MG tablet Take 50 mg by mouth at bedtime as needed  for sleep.    Yes Historical Provider, MD  docusate sodium (COLACE) 100 MG capsule Take 1 capsule (100 mg total) by mouth 2 (two) times daily. 11/15/14  Yes Simbiso Ranga, MD  esomeprazole (NEXIUM) 40 MG capsule Take 40 mg by mouth daily as needed (heartburn).   Yes Historical Provider, MD  furosemide (LASIX) 40 MG tablet Take 1 tablet (40 mg total) by mouth daily. 11/15/14  Yes Simbiso Ranga, MD  ibuprofen (ADVIL,MOTRIN) 200 MG tablet Take 600 mg by mouth every 6 (six) hours as needed for mild pain or moderate pain.   Yes Historical Provider, MD  insulin aspart (NOVOLOG) 100 UNIT/ML injection Inject 3 Units into the skin 3 (three) times daily before meals. Patient taking differently: Inject 5 Units into the skin 3 (three) times daily with meals.  07/26/14  Yes Belkys A Regalado, MD  insulin glargine (LANTUS) 100 UNIT/ML injection Inject 0.15 mLs (15 Units total) into the skin at bedtime. Patient taking differently: Inject 15 Units into the skin at bedtime. At 10pm 07/26/14  Yes Belkys A Regalado, MD  metFORMIN (GLUCOPHAGE) 500 MG tablet Take 500 mg by mouth 2 (two) times daily. 10/25/14  Yes Historical Provider, MD  Multiple Vitamin (MULTIVITAMIN WITH MINERALS) TABS tablet Take 1 tablet by mouth daily.    Yes Historical Provider, MD  potassium chloride (K-DUR) 10 MEQ tablet Take 1 tablet (10 mEq total) by mouth daily. 11/15/14  Yes Simbiso Ranga, MD  sertraline (ZOLOFT) 100 MG tablet Take 150 mg by mouth daily.   Yes Historical Provider, MD  oxyCODONE (OXY IR/ROXICODONE) 5 MG immediate release tablet Take 1 tablet (5 mg total) by mouth every 6 (six) hours as needed for severe pain. Patient not taking: Reported on 01/02/2015 12/21/14   Etta Quill, NP  spironolactone (ALDACTONE) 50 MG tablet Take 1 tablet (50 mg total) by mouth daily. Patient not taking: Reported on 01/02/2015 11/15/14   Simbiso Ranga, MD    BP 132/88 mmHg  Pulse 94  Temp(Src) 97.4 F (36.3 C) (Oral)  Resp 20  SpO2 99%  LMP  11/01/2011 Physical Exam  Constitutional: She is oriented to person, place, and time. She appears well-developed and well-nourished. She appears distressed.  Uncomfortable appearing female in mild distress due to pain.  HENT:  Head: Normocephalic and atraumatic.  Right Ear: External ear normal.  Left Ear: External ear normal.  Nose: Nose normal.  Mouth/Throat: Uvula is midline, oropharynx is clear and moist and mucous membranes are normal.  Eyes: Conjunctivae, EOM and lids are normal. Pupils are equal, round, and reactive to light. Right eye exhibits no discharge. Left eye exhibits no discharge. No scleral icterus.  Neck: Normal range of motion. Neck supple.  Cardiovascular: Regular rhythm, normal heart sounds, intact distal pulses and normal pulses.   Mild tachycardia.  Pulmonary/Chest: Effort normal and breath sounds normal. No respiratory distress. She has no wheezes. She has no rales.  Abdominal: Soft. Normal appearance and bowel sounds are normal. She exhibits distension. She exhibits no mass. There is tenderness. There is no rigidity, no rebound and no guarding.  Diffuse TTP of abdomen with significant abdominal distention.  Musculoskeletal: Normal range of motion. She exhibits no edema or tenderness.  Neurological: She is alert and oriented to person, place, and time.  Skin: Skin is warm, dry and intact. No rash noted. She is not diaphoretic. No erythema. No pallor.  Ecchymosis to LLQ of abdomen, which is where patient states she gives herself insulin injections.   Psychiatric: She has a normal mood and affect. Her speech is normal and behavior is normal. Judgment and thought content normal.  Nursing note and vitals reviewed.   ED Course  Procedures (including critical care time)  Labs Review Labs Reviewed  LIPASE, BLOOD - Abnormal; Notable for the following:    Lipase 79 (*)    All other components within normal limits  COMPREHENSIVE METABOLIC PANEL - Abnormal; Notable for  the following:    Sodium 129 (*)    Chloride 91 (*)    Glucose, Bld 486 (*)    Calcium 8.8 (*)    Albumin 3.1 (*)    AST 127 (*)    ALT 97 (*)    Total Bilirubin 2.0 (*)    All other components within normal limits  CBC - Abnormal; Notable for the following:    WBC 3.9 (*)    MCV 104.2 (*)    MCH 36.4 (*)    Platelets 72 (*)    All other components within normal limits  URINALYSIS, ROUTINE W REFLEX MICROSCOPIC (NOT AT [facility]) - Abnormal; Notable for the following:    Specific Gravity, Urine 1.040 (*)    Glucose, UA >1000 (*)    Ketones, ur 15 (*)    All other components within normal limits  PROTIME-INR - Abnormal; Notable for the following:    Prothrombin Time 15.6 (*)    All other components within normal limits  CBG MONITORING, ED - Abnormal; Notable for the following:    Glucose-Capillary 351 (*)    All other components within normal limits  APTT  URINE MICROSCOPIC-ADD ON  I-STAT CG4 LACTIC ACID, ED  I-STAT CG4 LACTIC ACID, ED    Imaging Review Ct Abdomen Pelvis W Contrast  01/02/2015   CLINICAL DATA:  49 year old female with abdominal pain nausea and vomiting.  EXAM: CT ABDOMEN AND PELVIS WITH CONTRAST  TECHNIQUE: Multidetector CT imaging of the abdomen and pelvis was performed using the standard protocol following bolus administration of intravenous contrast.  CONTRAST:  171mL OMNIPAQUE IOHEXOL 300 MG/ML  SOLN  COMPARISON:  CT 12/20/2014  FINDINGS: The visualized lung bases are clear. No intra-abdominal free air. Small ascites, increased from prior study.  Cirrhosis. Gallstone. No intrahepatic biliary ductal dilatation. The pancreas appears unremarkable. Stable mild splenomegaly measuring 14 cm. The adrenal glands, kidneys, visualized ureters, and urinary bladder appear unremarkable. Hysterectomy.  There is apparent thickening of the wall of the stomach likely related to underdistention. Gastritis is less likely. Clinical correlation is recommended. No bowel obstruction.  Appendectomy.  The abdominal aorta and IVC appear patent. No portal venous gas identified. Kidneys portal vein and intrahepatic branches are patent. The splenic vein patent. There is recanalization of the paraumbilical vein compatible with portal hypertension. There is prominence and improvement of the mesentery vasculature. There is no adenopathy. Mild degenerative changes of the spine. No acute fracture. Diffuse subcutaneous soft tissue edema and anasarca.  IMPRESSION: Cirrhosis with portal hypertension, splenomegaly, and small ascites. There has been interval increase in the size of the ascites compared to prior study.  Cholelithiasis.  No evidence of bowel obstruction.   Electronically Signed   By: Anner Crete M.D.   On: 01/02/2015 23:01   I have personally reviewed and evaluated these images and lab results as part of my medical decision-making.   EKG Interpretation None      MDM   Final diagnoses:  Abdominal pain  Nausea  Generalized weakness    49 year old female presents with worsening abdominal pain and distention x 1 week. Reports nausea, non-bloody emesis, and generalized weakness. Denies fever, though reports chills. Denies diarrhea, constipation, headache, lightheadedness, dizziness, loss of consciousness, chest pain, shortness of breath, dysuria, urgency, frequency.  Patient is afebrile. Vital signs stable. No tachypnea or hypotension. Tachycardia improved. Patient appears uncomfortable, in mild distress due to pain. Abdomen distended with diffuse TTP. No rebound, guarding, or masses.  Pain controlled in the ED with dilaudid. Patient given zofran for nausea. CBC with WBC 3.9, no anemia. CMP with NA 129, glucose 486, elevated transaminases, though appear stable from baseline. Lipase elevated at 79. UA with ketones, negative for infection. Lactic acid within normal limits. CT abdomen pelvis obtained. CT abdomen pelvis demonstrates cirrhosis with portal hypertension,  splenomegaly, small ascites, cholelithiasis; no evidence of bowel obstruction. Insulin and 500 NS bolus given for glucose control.    Patient reports she is still experiencing abdominal pain. Additional dose of dilaudid given.  Hospitalist consulted given patient's continued pain and abnormal labs in the setting of ascites. Spoke with hospitalist, who will see the patient in the ED. Patient to be admitted for further evaluation and management.  BP 119/77 mmHg  Pulse 86  Temp(Src) 98 F (36.7 C) (Oral)  Resp 19  SpO2 97%  LMP 11/01/2011      Marella Chimes, PA-C 01/03/15 [place], DO 01/03/15 [id number]

## 2015-01-02 NOTE — ED Notes (Signed)
Pt c/o abdominal pain, nausea, and emesis x 7 days. Pt has Hx of hepatitis C and cirrhosis, has had abdomen drained s/t ascites in past.

## 2015-01-03 ENCOUNTER — Ambulatory Visit: Payer: Self-pay | Admitting: Physician Assistant

## 2015-01-03 ENCOUNTER — Observation Stay (HOSPITAL_COMMUNITY): Payer: Medicaid Other

## 2015-01-03 ENCOUNTER — Encounter (HOSPITAL_COMMUNITY): Payer: Self-pay | Admitting: *Deleted

## 2015-01-03 DIAGNOSIS — R109 Unspecified abdominal pain: Secondary | ICD-10-CM

## 2015-01-03 DIAGNOSIS — B182 Chronic viral hepatitis C: Secondary | ICD-10-CM

## 2015-01-03 DIAGNOSIS — E43 Unspecified severe protein-calorie malnutrition: Secondary | ICD-10-CM | POA: Insufficient documentation

## 2015-01-03 DIAGNOSIS — R188 Other ascites: Secondary | ICD-10-CM | POA: Diagnosis present

## 2015-01-03 DIAGNOSIS — K7031 Alcoholic cirrhosis of liver with ascites: Secondary | ICD-10-CM

## 2015-01-03 DIAGNOSIS — F1910 Other psychoactive substance abuse, uncomplicated: Secondary | ICD-10-CM | POA: Diagnosis not present

## 2015-01-03 LAB — BODY FLUID CELL COUNT WITH DIFFERENTIAL
LYMPHS FL: 15 %
MONOCYTE-MACROPHAGE-SEROUS FLUID: 80 % (ref 50–90)
NEUTROPHIL FLUID: 5 % (ref 0–25)
WBC FLUID: 305 uL (ref 0–1000)

## 2015-01-03 LAB — GLUCOSE, CAPILLARY
GLUCOSE-CAPILLARY: 297 mg/dL — AB (ref 65–99)
GLUCOSE-CAPILLARY: 347 mg/dL — AB (ref 65–99)
Glucose-Capillary: 224 mg/dL — ABNORMAL HIGH (ref 65–99)

## 2015-01-03 LAB — CBC
HCT: 42.4 % (ref 36.0–46.0)
HCT: 42.1 % (ref 36.0–46.0)
Hemoglobin: 14.4 g/dL (ref 12.0–15.0)
Hemoglobin: 14.1 g/dL (ref 12.0–15.0)
MCH: 35.6 pg — AB (ref 26.0–34.0)
MCH: 35.3 pg — AB (ref 26.0–34.0)
MCHC: 34 g/dL (ref 30.0–36.0)
MCHC: 33.5 g/dL (ref 30.0–36.0)
MCV: 105 fL — AB (ref 78.0–100.0)
MCV: 105.3 fL — ABNORMAL HIGH (ref 78.0–100.0)
PLATELETS: 63 10*3/uL — AB (ref 150–400)
PLATELETS: 66 10*3/uL — AB (ref 150–400)
RBC: 4.04 MIL/uL (ref 3.87–5.11)
RBC: 4 MIL/uL (ref 3.87–5.11)
RDW: 14.7 % (ref 11.5–15.5)
RDW: 14.5 % (ref 11.5–15.5)
WBC: 2.4 10*3/uL — AB (ref 4.0–10.5)
WBC: 2.1 10*3/uL — ABNORMAL LOW (ref 4.0–10.5)

## 2015-01-03 LAB — BASIC METABOLIC PANEL
Anion gap: 10 (ref 5–15)
BUN: 8 mg/dL (ref 6–20)
CALCIUM: 8.4 mg/dL — AB (ref 8.9–10.3)
CO2: 26 mmol/L (ref 22–32)
CREATININE: 0.44 mg/dL (ref 0.44–1.00)
Chloride: 96 mmol/L — ABNORMAL LOW (ref 101–111)
GFR calc non Af Amer: >60 mL/min (ref >60)
Glucose, Bld: 255 mg/dL — ABNORMAL HIGH (ref 65–99)
Potassium: 3.1 mmol/L — ABNORMAL LOW (ref 3.5–5.1)
SODIUM: 132 mmol/L — AB (ref 135–145)

## 2015-01-03 LAB — PROTIME-INR
INR: 1.27 (ref 0.00–1.49)
PROTHROMBIN TIME: 16 s — AB (ref 11.6–15.2)

## 2015-01-03 MED ORDER — DOCUSATE SODIUM 100 MG PO CAPS
100.0000 mg | ORAL_CAPSULE | Freq: Two times a day (BID) | ORAL | Status: DC
Start: 2015-01-03 — End: 2015-01-04
  Administered 2015-01-03 – 2015-01-04 (×3): 100 mg via ORAL

## 2015-01-03 MED ORDER — HEPARIN SODIUM (PORCINE) 5000 UNIT/ML IJ SOLN
5000.0000 [IU] | Freq: Three times a day (TID) | INTRAMUSCULAR | Status: DC
  Administered 2015-01-03: 5000 [IU] via SUBCUTANEOUS
  Filled 2015-01-03 (×10): qty 1

## 2015-01-03 MED ORDER — POTASSIUM CHLORIDE CRYS ER 20 MEQ PO TBCR
40.0000 meq | EXTENDED_RELEASE_TABLET | Freq: Once | ORAL | Status: AC
  Administered 2015-01-03: 40 meq via ORAL
  Filled 2015-01-03: qty 2

## 2015-01-03 MED ORDER — SPIRONOLACTONE 50 MG PO TABS
50.0000 mg | ORAL_TABLET | Freq: Every day | ORAL | Status: DC
  Administered 2015-01-03: 50 mg via ORAL
  Filled 2015-01-03 (×2): qty 1

## 2015-01-03 MED ORDER — OXYCODONE HCL 5 MG PO TABS
5.0000 mg | ORAL_TABLET | Freq: Four times a day (QID) | ORAL | Status: DC | PRN
  Administered 2015-01-03 – 2015-01-04 (×5): 5 mg via ORAL
  Filled 2015-01-03 (×5): qty 1

## 2015-01-03 MED ORDER — INFLUENZA VAC SPLIT QUAD 0.5 ML IM SUSY
0.5000 mL | PREFILLED_SYRINGE | INTRAMUSCULAR | Status: AC
  Administered 2015-01-04: 0.5 mL via INTRAMUSCULAR
  Filled 2015-01-03 (×2): qty 0.5

## 2015-01-03 MED ORDER — ONDANSETRON HCL 4 MG PO TABS: 4.0000 mg | ORAL_TABLET | Freq: Four times a day (QID) | ORAL | Status: DC | PRN

## 2015-01-03 MED ORDER — FUROSEMIDE 40 MG PO TABS
40.0000 mg | ORAL_TABLET | Freq: Two times a day (BID) | ORAL | Status: DC
  Administered 2015-01-03 – 2015-01-04 (×3): 40 mg via ORAL
  Filled 2015-01-03 (×5): qty 1

## 2015-01-03 MED ORDER — ONDANSETRON HCL 4 MG/2ML IJ SOLN: 4.0000 mg | Freq: Four times a day (QID) | INTRAMUSCULAR | Status: DC | PRN

## 2015-01-03 MED ORDER — INSULIN ASPART 100 UNIT/ML ~~LOC~~ SOLN
0.0000 [IU] | Freq: Every day | SUBCUTANEOUS | Status: DC
  Administered 2015-01-03: 4 [IU] via SUBCUTANEOUS

## 2015-01-03 MED ORDER — SPIRONOLACTONE 50 MG PO TABS
50.0000 mg | ORAL_TABLET | Freq: Every day | ORAL | Status: DC
  Filled 2015-01-03: qty 1

## 2015-01-03 MED ORDER — DIPHENHYDRAMINE HCL 25 MG PO CAPS: 50.0000 mg | ORAL_CAPSULE | Freq: Every evening | ORAL | Status: DC | PRN

## 2015-01-03 MED ORDER — METFORMIN HCL 500 MG PO TABS
500.0000 mg | ORAL_TABLET | Freq: Two times a day (BID) | ORAL | Status: DC
  Filled 2015-01-03 (×3): qty 1

## 2015-01-03 MED ORDER — SERTRALINE HCL 50 MG PO TABS
150.0000 mg | ORAL_TABLET | Freq: Every day | ORAL | Status: DC
  Administered 2015-01-03 – 2015-01-04 (×2): 150 mg via ORAL
  Filled 2015-01-03 (×3): qty 1

## 2015-01-03 MED ORDER — PANTOPRAZOLE SODIUM 40 MG PO TBEC
40.0000 mg | DELAYED_RELEASE_TABLET | Freq: Every day | ORAL | Status: DC
  Administered 2015-01-03 – 2015-01-04 (×2): 40 mg via ORAL
  Filled 2015-01-03 (×3): qty 1

## 2015-01-03 MED ORDER — SODIUM CHLORIDE 0.9 % IJ SOLN
3.0000 mL | Freq: Two times a day (BID) | INTRAMUSCULAR | Status: DC
  Administered 2015-01-03 – 2015-01-04 (×2): 3 mL via INTRAVENOUS

## 2015-01-03 MED ORDER — GLUCERNA SHAKE PO LIQD
237.0000 mL | Freq: Three times a day (TID) | ORAL | Status: DC
  Administered 2015-01-03 – 2015-01-04 (×4): 237 mL via ORAL
  Filled 2015-01-03 (×5): qty 237

## 2015-01-03 MED ORDER — SODIUM CHLORIDE 0.9 % IJ SOLN: 3.0000 mL | INTRAMUSCULAR | Status: DC | PRN

## 2015-01-03 MED ORDER — SODIUM CHLORIDE 0.9 % IV SOLN: 250.0000 mL | INTRAVENOUS | Status: DC | PRN

## 2015-01-03 MED ORDER — INSULIN GLARGINE 100 UNIT/ML ~~LOC~~ SOLN
12.0000 [IU] | Freq: Every day | SUBCUTANEOUS | Status: DC
  Administered 2015-01-03: 12 [IU] via SUBCUTANEOUS
  Filled 2015-01-03 (×2): qty 0.12

## 2015-01-03 MED ORDER — INSULIN GLARGINE 100 UNIT/ML ~~LOC~~ SOLN: 10.0000 [IU] | Freq: Every day | SUBCUTANEOUS | Status: DC

## 2015-01-03 MED ORDER — ADULT MULTIVITAMIN W/MINERALS CH
1.0000 | ORAL_TABLET | Freq: Every day | ORAL | Status: DC
  Administered 2015-01-03 – 2015-01-04 (×2): 1 via ORAL
  Filled 2015-01-03 (×3): qty 1

## 2015-01-03 MED ORDER — POTASSIUM CHLORIDE ER 10 MEQ PO TBCR
10.0000 meq | EXTENDED_RELEASE_TABLET | Freq: Every day | ORAL | Status: DC
  Filled 2015-01-03: qty 1

## 2015-01-03 MED ORDER — INSULIN ASPART 100 UNIT/ML ~~LOC~~ SOLN
0.0000 [IU] | Freq: Three times a day (TID) | SUBCUTANEOUS | Status: DC
  Administered 2015-01-03: 3 [IU] via SUBCUTANEOUS
  Administered 2015-01-03: 5 [IU] via SUBCUTANEOUS
  Administered 2015-01-04: 3 [IU] via SUBCUTANEOUS
  Administered 2015-01-04: 2 [IU] via SUBCUTANEOUS

## 2015-01-03 MED ORDER — SPIRONOLACTONE 50 MG PO TABS
50.0000 mg | ORAL_TABLET | Freq: Two times a day (BID) | ORAL | Status: DC
  Filled 2015-01-03 (×2): qty 1

## 2015-01-03 MED ORDER — SODIUM CHLORIDE 0.9 % IV BOLUS (SEPSIS)
500.0000 mL | Freq: Once | INTRAVENOUS | Status: AC
  Administered 2015-01-03: 500 mL via INTRAVENOUS

## 2015-01-03 MED ORDER — FUROSEMIDE 40 MG PO TABS
40.0000 mg | ORAL_TABLET | Freq: Every day | ORAL | Status: DC
  Filled 2015-01-03: qty 1

## 2015-01-03 NOTE — H&P (Signed)
[facility]                                                                                    Patient Demographics  [name], is a 49 y.o. female  CSN: [id number]  MRN: [id number]  DOB - Sep 17, 1965  Admit Date - 01/02/2015  Outpatient Primary MD for the patient is Cathlean Cower, MD   With History of -  Past Medical History  Diagnosis Date  . Impaired glucose tolerance 11/07/2010  . Hepatitis C approx dx 2005    no tx to date  . Pancreatitis     HX of  . Alcohol dependency     at least [place] admissions 2007- 09/2013 for detox.   Marland Kitchen GERD (gastroesophageal reflux disease)   . Chronic constipation   . Gallstones   . Drug dependency     Hx of cocaine use  . Anxiety and depression 08/2011  . Rheumatoid factor positive   . Hemochromatosis     last phlebotomy tx ~ 2012. liver bx 2006  . Pancytopenia   . Abnormal vaginal bleeding     uterine fibroid  . Gallstone 2006  . Chronic abdominal pain 2011  . Cirrhosis 11/09/2010  . Type II or unspecified type diabetes mellitus without mention of complication, uncontrolled 09/17/2011  . Major depression 11/2012, 09/2013    [facility] admissions for this, suicide attempt by OD Ambien (2014), Prozac (2015),ETOHism   . Anxiety       Past Surgical History  Procedure Laterality Date  . Appendectomy    . Cesarean section  x 2  . Foot surgery  2013  . Abdominal hysterectomy  12/17/2011    Procedure: HYSTERECTOMY ABDOMINAL;  Surgeon: Gus Height, MD;  Location: [facility];  Service: Gynecology;  Laterality: N/A;  . Salpingoophorectomy  12/17/2011    Procedure: SALPINGO OOPHERECTOMY;  Surgeon: Gus Height, MD;  Location: [facility];  Service: Gynecology;  Laterality: Bilateral;  . Esophagogastroduodenoscopy N/A 12/10/2013    Procedure: ESOPHAGOGASTRODUODENOSCOPY (EGD);  Surgeon: Ladene Artist, MD;  Location: [facility];  Service: Endoscopy;  Laterality: N/A;  . Percutaneous liver biopsy  2011  . Colonoscopy N/A 02/18/2014    Procedure:  COLONOSCOPY;  Surgeon: Gatha Mayer, MD;  Location: [facility];  Service: Endoscopy;  Laterality: N/A;    in for   Chief Complaint  Patient presents with  . Abdominal Pain     HPI  [name]  is a 49 y.o. female, with past medical history significant for liver cirrhosis and ascites presenting with worsening abdominal pain for the last 7 days with increasing girth. Patient also has a history of hepatitis C and alcoholism with other substance abuse. She denies chest pains shortness of breath nausea or vomiting but reports increasing and her reflux symptoms.    Review of Systems    In addition to the HPI above,  No Fever-chills, No Headache, No changes with Vision or hearing, No problems swallowing food or Liquids, No Chest pain, Cough or Shortness of Breath, No Blood in stool or Urine, No dysuria, No new skin rashes or bruises, No new joints pains-aches,  No new weakness, tingling, numbness  in any extremity, No recent weight gain or loss, No polyuria, polydypsia or polyphagia, No significant Mental Stressors.  A full 10 point Review of Systems was done, except as stated above, all other Review of Systems were negative.   Social History Social History  Substance Use Topics  . Smoking status: Never Smoker   . Smokeless tobacco: Never Used  . Alcohol Use: Yes     Comment: one pint vodka daily years     Family History Family History  Problem Relation Age of Onset  . Cancer Mother     ovarian  . Diabetes Father      Prior to Admission medications   Medication Sig Start Date End Date Taking? Authorizing Provider  diphenhydrAMINE (BENADRYL) 25 MG tablet Take 50 mg by mouth at bedtime as needed for sleep.    Yes Historical Provider, MD  docusate sodium (COLACE) 100 MG capsule Take 1 capsule (100 mg total) by mouth 2 (two) times daily. 11/15/14  Yes Simbiso Ranga, MD  esomeprazole (NEXIUM) 40 MG capsule Take 40 mg by mouth daily as needed (heartburn).   Yes  Historical Provider, MD  furosemide (LASIX) 40 MG tablet Take 1 tablet (40 mg total) by mouth daily. 11/15/14  Yes Simbiso Ranga, MD  ibuprofen (ADVIL,MOTRIN) 200 MG tablet Take 600 mg by mouth every 6 (six) hours as needed for mild pain or moderate pain.   Yes Historical Provider, MD  insulin aspart (NOVOLOG) 100 UNIT/ML injection Inject 3 Units into the skin 3 (three) times daily before meals. Patient taking differently: Inject 5 Units into the skin 3 (three) times daily with meals.  07/26/14  Yes Belkys A Regalado, MD  insulin glargine (LANTUS) 100 UNIT/ML injection Inject 0.15 mLs (15 Units total) into the skin at bedtime. Patient taking differently: Inject 15 Units into the skin at bedtime. At 10pm 07/26/14  Yes Belkys A Regalado, MD  metFORMIN (GLUCOPHAGE) 500 MG tablet Take 500 mg by mouth 2 (two) times daily. 10/25/14  Yes Historical Provider, MD  Multiple Vitamin (MULTIVITAMIN WITH MINERALS) TABS tablet Take 1 tablet by mouth daily.    Yes Historical Provider, MD  potassium chloride (K-DUR) 10 MEQ tablet Take 1 tablet (10 mEq total) by mouth daily. 11/15/14  Yes Simbiso Ranga, MD  sertraline (ZOLOFT) 100 MG tablet Take 150 mg by mouth daily.   Yes Historical Provider, MD  oxyCODONE (OXY IR/ROXICODONE) 5 MG immediate release tablet Take 1 tablet (5 mg total) by mouth every 6 (six) hours as needed for severe pain. Patient not taking: Reported on 01/02/2015 12/21/14   Etta Quill, NP  spironolactone (ALDACTONE) 50 MG tablet Take 1 tablet (50 mg total) by mouth daily. Patient not taking: Reported on 01/02/2015 11/15/14   Nat Math, MD    Allergies  Allergen Reactions  . Betadine [Povidone Iodine] Hives    Physical Exam  Vitals  Blood pressure 119/77, pulse 86, temperature 98 F (36.7 C), temperature source Oral, resp. rate 19, last menstrual period 11/01/2011, SpO2 97 %.   1. General well-developed, well-nourished female, extremely pleasant  2. Normal affect and insight, Not Suicidal or  Homicidal, Awake Alert, Oriented X 3.  3. No F.N deficits, grossly,   4. Ears and Eyes appear Normal, Conjunctivae clear, PERRLA. Moist Oral Mucosa.  5. Supple Neck, No JVD, No cervical lymphadenopathy appriciated, No Carotid Bruits.  6. Symmetrical Chest wall movement, Good air movement bilaterally, CTAB.  7. RRR, No Gallops, Rubs or Murmurs, No Parasternal Heave.  8. Positive Bowel  Sounds, Abdomen Soft, Non tender, No organomegaly appriciated,No rebound -guarding or rigidity.  9.  No Cyanosis, Normal Skin Turgor, No Skin Rash or Bruise.  10. Good muscle tone,  joints appear normal , no effusions, Normal ROM.  11. No Palpable Lymph Nodes in Neck or Axillae    Data Review  CBC  Recent Labs Lab 01/02/15 1802  WBC 3.9*  HGB 14.8  HCT 42.4  PLT 72*  MCV 104.2*  MCH 36.4*  MCHC 34.9  RDW 14.6   ------------------------------------------------------------------------------------------------------------------  Chemistries   Recent Labs Lab 01/02/15 1802  NA 129*  K 3.7  CL 91*  CO2 28  GLUCOSE 486*  BUN 12  CREATININE 0.61  CALCIUM 8.8*  AST 127*  ALT 97*  ALKPHOS 115  BILITOT 2.0*   ------------------------------------------------------------------------------------------------------------------ CrCl cannot be calculated (Unknown ideal weight.). ------------------------------------------------------------------------------------------------------------------ No results for input(s): TSH, T4TOTAL, T3FREE, THYROIDAB in the last 72 hours.  Invalid input(s): FREET3   Coagulation profile  Recent Labs Lab 01/02/15 2058  INR 1.22   ------------------------------------------------------------------------------------------------------------------- No results for input(s): DDIMER in the last 72 hours. -------------------------------------------------------------------------------------------------------------------  Cardiac Enzymes No results for  input(s): CKMB, TROPONINI, MYOGLOBIN in the last 168 hours.  Invalid input(s): CK ------------------------------------------------------------------------------------------------------------------ Invalid input(s): POCBNP   ---------------------------------------------------------------------------------------------------------------  Urinalysis    Component Value Date/Time   COLORURINE YELLOW 01/02/2015 2015   APPEARANCEUR CLEAR 01/02/2015 2015   LABSPEC 1.040* 01/02/2015 2015   PHURINE 7.5 01/02/2015 2015   GLUCOSEU >1000* 01/02/2015 2015   GLUCOSEU >=1000* 07/18/2014 0943   HGBUR NEGATIVE 01/02/2015 2015   BILIRUBINUR NEGATIVE 01/02/2015 2015   BILIRUBINUR neg 05/19/2013 0855   KETONESUR 15* 01/02/2015 2015   PROTEINUR NEGATIVE 01/02/2015 2015   PROTEINUR neg 05/19/2013 0855   UROBILINOGEN 1.0 01/02/2015 2015   UROBILINOGEN 0.2 05/19/2013 0855   NITRITE NEGATIVE 01/02/2015 2015   NITRITE neg 05/19/2013 0855   LEUKOCYTESUR NEGATIVE 01/02/2015 2015    ----------------------------------------------------------------------------------------------------------------   Imaging results:   Ct Abdomen Pelvis W Contrast  01/02/2015   CLINICAL DATA:  49 year old female with abdominal pain nausea and vomiting.  EXAM: CT ABDOMEN AND PELVIS WITH CONTRAST  TECHNIQUE: Multidetector CT imaging of the abdomen and pelvis was performed using the standard protocol following bolus administration of intravenous contrast.  CONTRAST:  192mL OMNIPAQUE IOHEXOL 300 MG/ML  SOLN  COMPARISON:  CT 12/20/2014  FINDINGS: The visualized lung bases are clear. No intra-abdominal free air. Small ascites, increased from prior study.  Cirrhosis. Gallstone. No intrahepatic biliary ductal dilatation. The pancreas appears unremarkable. Stable mild splenomegaly measuring 14 cm. The adrenal glands, kidneys, visualized ureters, and urinary bladder appear unremarkable. Hysterectomy.  There is apparent thickening of the wall  of the stomach likely related to underdistention. Gastritis is less likely. Clinical correlation is recommended. No bowel obstruction. Appendectomy.  The abdominal aorta and IVC appear patent. No portal venous gas identified. Kidneys portal vein and intrahepatic branches are patent. The splenic vein patent. There is recanalization of the paraumbilical vein compatible with portal hypertension. There is prominence and improvement of the mesentery vasculature. There is no adenopathy. Mild degenerative changes of the spine. No acute fracture. Diffuse subcutaneous soft tissue edema and anasarca.  IMPRESSION: Cirrhosis with portal hypertension, splenomegaly, and small ascites. There has been interval increase in the size of the ascites compared to prior study.  Cholelithiasis.  No evidence of bowel obstruction.   Electronically Signed   By: Anner Crete M.D.   On: 01/02/2015 23:01   Ct Abdomen Pelvis W Contrast  12/20/2014  CLINICAL DATA:  Progressive rash involving the arms and legs for 3 days. Hyperglycemia with occasional vomiting. History of cirrhosis. Initial encounter.  EXAM: CT ABDOMEN AND PELVIS WITH CONTRAST  TECHNIQUE: Multidetector CT imaging of the abdomen and pelvis was performed using the standard protocol following bolus administration of intravenous contrast.  CONTRAST:  15mL OMNIPAQUE IOHEXOL 300 MG/ML SOLN, 120mL OMNIPAQUE IOHEXOL 300 MG/ML SOLN  COMPARISON:  MRI 11/12/2014.  CT 11/07/2014.  FINDINGS: Lower chest: Clear lung bases. No significant pleural or pericardial effusion.  Hepatobiliary: Again demonstrated are diffuse changes of cirrhosis with contour irregularity and capsular contraction inferiorly in the right lobe. The liver appears heterogeneous on the delayed post-contrast images, but no focal lesion or abnormal enhancement identified. Stable small calcified gallstone and gallbladder wall thickening, likely related to chronic liver disease. No significant biliary dilatation.   Pancreas: Unremarkable. No pancreatic ductal dilatation or surrounding inflammatory changes.  Spleen: Stable mild splenomegaly with the spleen measuring up to 13.6 cm in height. No focal abnormality identified.  Adrenals/Urinary Tract: Both adrenal glands appear normal. There are tiny nonobstructing calculi in the upper and lower poles of the left kidney. There is a tiny cyst in the upper pole of the left kidney. The right kidney appears normal. No evidence of hydronephrosis or ureteral calculus. The bladder appears normal.  Stomach/Bowel: No evidence of bowel wall thickening, distention or surrounding inflammatory change. Retrocecal surgical clips consistent with prior appendectomy. Moderate stool throughout the colon.  Vascular/Lymphatic: Stable prominent lymph nodes within the porta hepatis attributed to chronic liver disease. No retroperitoneal lymphadenopathy. Stable mild aortoiliac atherosclerosis and small perisplenic varices.  Reproductive: Hysterectomy.  No evidence of adnexal mass.  Other: Ascites has improved compared with the prior CT with minimal residual perihepatic fluid.  Musculoskeletal: No acute or significant osseous findings.  IMPRESSION: 1. No acute findings or explanation for the patient's symptoms. 2. Generally stable appearance of the liver with diffuse contour irregularity and heterogeneity consistent with cirrhosis. No focal lesion identified. 3. Cholelithiasis and chronic gallbladder wall thickening, likely related to chronic liver disease. 4. Splenomegaly and perisplenic varices again noted. Ascites has improved.   Electronically Signed   By: Richardean Sale M.D.   On: 12/20/2014 19:52      Assessment & Plan   1. Worsening ascites 2. History of liver cirrhosis 3. History of substance abuse 4. History of hepatitis C untreated  Plan  Admit for paracentesis by IR in a.m. Continue medications Hold her Lantus Nothing by mouth after midnight  DVT Prophylaxis Heparin   AM  Labs Ordered, also please review Full Orders  Code Status full  Disposition Plan: Home  Time spent in minutes : 31 minutes  Condition GUARDED   @SIGNATURE @

## 2015-01-03 NOTE — Progress Notes (Addendum)
[facility] Progress Note   Cheresa Siers [place]  KVQ:[id number]  DOB: Apr 16, 1966  DOA: 01/02/2015 PCP: Cathlean Cower, MD  Brief narrative: [name] is a 49 y.o. female with hepatitis C, alcohol use cirrhosis and ascites who presents to the hospital for increasingly distended abdomen and abdominal pain. She was admitted for paracentesis.   Subjective: -He is to have abdominal pain. No nausea vomiting diarrhea. No fevers or chills. She tells me she has been taking her furosemide but she is not aware whether she is on spironolactone and whether she has been taking it.  Assessment/Plan: Principal Problem:   Abdominal pain -CT scan of abdomen and pelvis done in the ER was reported as "small ascites" however abdomen is quite tight distended and dull to percussion -Paracentesis ordered- send fluid for cell count and culture -If she does indeed have a large amount of fluid in the abdomen, will need to increase diuretics & ensure that she calls her PCP to adjust diuretics before abdomen becomes it reached a point where another paracentesis is needed- may need a home health RN- not sure if she has been taking Aldactone at home  Active Problems:    Cirrhosis of liver/ portal HTN -in Relation to hep C and reported alcohol abuse  Leukopenia and thrombocytopenia - chronic - Due to cirrhosis?  Question of cholangiocarcinoma -Seen on imaging last admission-GI recommended to continue to follow tumor markers as she was a very high risk for biopsy -she had a follow-up with Dr. Deatra Ina today and missed it-have asked her to reschedule  Hypokalemia -Replace and recheck tomorrow  Hyponatremia - due to Lasix? follow    Protein-calorie malnutrition, severe --Continue Glucerna  Insulin requiring diabetes mellitus -Sugars elevated today- did not get Lantus last night - resume Lantus at lower than home dose-continue sliding scale  Depression --Continue Zoloft  Cholelithiasis   Code  Status:     Code Status Orders        Start     Ordered   01/03/15 0453  Full code   Continuous     01/03/15 0453     Disposition Plan: Likely home with home health DVT prophylaxis: [facility] Procedures:  Antibiotics: Anti-infectives    None      Objective: Filed Weights   01/03/15 0500  Weight: 49.896 kg (110 lb)    Intake/Output Summary (Last 24 hours) at 01/03/15 1628 Last data filed at 01/03/15 0700  Gross per 24 hour  Intake      0 ml  Output      0 ml  Net      0 ml     Vitals Filed Vitals:   01/03/15 0056 01/03/15 0300 01/03/15 0500 01/03/15 0504  BP: 119/77 127/76  141/93  Pulse: 86 96  90  Temp: 98 F (36.7 C) 97.9 F (36.6 C)  98.4 F (36.9 C)  TempSrc: Oral Oral  Oral  Resp: 19 16  16   Height:   5\' 2"  (1.575 m)   Weight:   49.896 kg (110 lb)   SpO2: 97% 95%  100%    Exam:  General:  Pt is alert, not in acute distress  HEENT: No icterus, No thrush, oral mucosa moist  Cardiovascular: regular rate and rhythm, S1/S2 No murmur  Respiratory: clear to auscultation bilaterally   Abdomen: Soft, +Bowel sounds, diffusely tender, significantly distended and dull to precussion, no guarding  MSK: No LE edema, cyanosis or clubbing  Data Reviewed: Basic Metabolic Panel:  Recent  Labs Lab 01/02/15 1802 01/03/15 0640  NA 129* 132*  K 3.7 3.1*  CL 91* 96*  CO2 28 26  GLUCOSE 486* 255*  BUN 12 8  CREATININE 0.61 0.44  CALCIUM 8.8* 8.4*   Liver Function Tests:  Recent Labs Lab 01/02/15 1802  AST 127*  ALT 97*  ALKPHOS 115  BILITOT 2.0*  PROT 7.5  ALBUMIN 3.1*    Recent Labs Lab 01/02/15 1802  LIPASE 79*   No results for input(s): AMMONIA in the last 168 hours. CBC:  Recent Labs Lab 01/02/15 1802 01/03/15 0640  WBC 3.9* 2.4*  HGB 14.8 14.4  HCT 42.4 42.4  MCV 104.2* 105.0*  PLT 72* 63*   Cardiac Enzymes: No results for input(s): CKTOTAL, CKMB, CKMBINDEX, TROPONINI in the last 168 hours. BNP (last 3  results) No results for input(s): BNP in the last 8760 hours.  ProBNP (last 3 results) No results for input(s): PROBNP in the last 8760 hours.  CBG:  Recent Labs Lab 01/02/15 2335 01/03/15 1038  GLUCAP 351* 224*    No results found for this or any previous visit (from the past 240 hour(s)).   Studies: Ct Abdomen Pelvis W Contrast  01/02/2015   CLINICAL DATA:  49 year old female with abdominal pain nausea and vomiting.  EXAM: CT ABDOMEN AND PELVIS WITH CONTRAST  TECHNIQUE: Multidetector CT imaging of the abdomen and pelvis was performed using the standard protocol following bolus administration of intravenous contrast.  CONTRAST:  16mL OMNIPAQUE IOHEXOL 300 MG/ML  SOLN  COMPARISON:  CT 12/20/2014  FINDINGS: The visualized lung bases are clear. No intra-abdominal free air. Small ascites, increased from prior study.  Cirrhosis. Gallstone. No intrahepatic biliary ductal dilatation. The pancreas appears unremarkable. Stable mild splenomegaly measuring 14 cm. The adrenal glands, kidneys, visualized ureters, and urinary bladder appear unremarkable. Hysterectomy.  There is apparent thickening of the wall of the stomach likely related to underdistention. Gastritis is less likely. Clinical correlation is recommended. No bowel obstruction. Appendectomy.  The abdominal aorta and IVC appear patent. No portal venous gas identified. Kidneys portal vein and intrahepatic branches are patent. The splenic vein patent. There is recanalization of the paraumbilical vein compatible with portal hypertension. There is prominence and improvement of the mesentery vasculature. There is no adenopathy. Mild degenerative changes of the spine. No acute fracture. Diffuse subcutaneous soft tissue edema and anasarca.  IMPRESSION: Cirrhosis with portal hypertension, splenomegaly, and small ascites. There has been interval increase in the size of the ascites compared to prior study.  Cholelithiasis.  No evidence of bowel  obstruction.   Electronically Signed   By: Anner Crete M.D.   On: 01/02/2015 23:01    Scheduled Meds:  Scheduled Meds: . docusate sodium  100 mg Oral BID  . feeding supplement (GLUCERNA SHAKE)  237 mL Oral TID BM  . furosemide  40 mg Oral BID  . heparin  5,000 Units Subcutaneous 3 times per day  . [START ON 01/04/2015] Influenza vac split quadrivalent PF  0.5 mL Intramuscular Tomorrow-1000  . insulin aspart  0-5 Units Subcutaneous QHS  . insulin aspart  0-9 Units Subcutaneous TID WC  . insulin glargine  12 Units Subcutaneous QHS  . multivitamin with minerals  1 tablet Oral Daily  . pantoprazole  40 mg Oral Daily  . sertraline  150 mg Oral Daily  . sodium chloride  3 mL Intravenous Q12H  . spironolactone  50 mg Oral Daily   Continuous Infusions:   Time spent on care of this  patient: 3 min   [place], MD 01/03/2015, 4:28 PM    [facility] Office  [phone] Pager - Text Page per [email or web address] If 7PM-7AM, please contact night-coverage [email or web address]

## 2015-01-03 NOTE — Procedures (Addendum)
US guided diagnostic/therapeutic paracentesis performed yielding 1.5 liters slightly hazy, yellow fluid. A portion of the fluid was sent to the lab for preordered studies. Pt developed small puncture site hematoma RLQ postprocedure. Manual pressure held x 20 minutes and [place] postprocedure revealed stability of hematoma.Pt also seen by Dr. Laurence Ferrari.  Recommend 2 hour bedrest with VS/symptom monitoring. Will recheck CBC in 1 hour and in am. Dr. Wynelle Cleveland and nurse informed.

## 2015-01-03 NOTE — Progress Notes (Signed)
Initial Nutrition Assessment  DOCUMENTATION CODES:   Severe malnutrition in context of acute illness/injury  INTERVENTION:   Provide Glucerna Shake po TID, each supplement provides 220 kcal and 10 grams of protein Encourage PO intake RD to continue to monitor  NUTRITION DIAGNOSIS:   Inadequate oral intake related to poor appetite, nausea, other (see comment) (abdominal pain) as evidenced by energy intake < or equal to 50% for > or equal to 5 days.  GOAL:   Patient will meet greater than or equal to 90% of their needs  MONITOR:   PO intake, Supplement acceptance, Labs, Weight trends, Skin, I & O's  REASON FOR ASSESSMENT:   Malnutrition Screening Tool    ASSESSMENT:   49 y.o. female, with past medical history significant for liver cirrhosis and ascites presenting with worsening abdominal pain for the last 7 days with increasing girth. Patient also has a history of hepatitis C and alcoholism with other substance abuse. She denies chest pains shortness of breath nausea or vomiting but reports increasing and her reflux symptoms.  Pt reports eating lunch but vomiting afterwards. Pt feeling very nauseous. Pt feels this was d/t taking her medications before eating.  Pt's diet just advanced to CHO modified.  Pt states that PTA she was eating but slightly less than usual.  Per weight history, pt has lost 11 lb since 4/01 (9% weight loss x 4.5 months, significant for time frame).  Pt interested in nutritional supplements. Would like for her to have Ensure supplements for extra protein but will order Glucerna shakes TID while blood sugar is elevated.  Nutrition-Focused physical exam completed. Findings are mild fat depletion, mild muscle depletion, and no edema.   Labs reviewed: CBGs: 224-351 Low Na, K  Diet Order:  Diet Carb Modified Fluid consistency:: Thin; Room service appropriate?: Yes  Skin:  Reviewed, no issues  Last BM:  9/12  Height:   Ht Readings from Last 1  Encounters:  01/03/15 5\' 2"  (1.575 m)    Weight:   Wt Readings from Last 1 Encounters:  01/03/15 110 lb (49.896 kg)    Ideal Body Weight:  50 kg  BMI:  Body mass index is 20.11 kg/(m^2).  Estimated Nutritional Needs:   Kcal:  1500-1700  Protein:  75-85g  Fluid:  1.6L/day  EDUCATION NEEDS:   No education needs identified at this time  Clayton Bibles, MS, RD, LDN Pager: [phone] After Hours Pager: [phone]

## 2015-01-03 NOTE — Progress Notes (Signed)
   Results for SATOMI, BUDA (MRN [id number]) as of 01/03/2015 11:49  Ref. Range 01/02/2015 23:35 01/03/2015 10:38  Glucose-Capillary Latest Ref Range: 65-99 mg/dL 351 (H) 224 (H)    Home DM Meds: Lantus 15 units QHS, Novolog 5 units tidwc, metformin 500 bid Current DM Orders: Lantus 12 units QHS, Novolog Sensitive SSI (0-9 units) TID AC + HS     -Eating well.  -Having postprandial glucose elevations.  MD - please consider addition of Novolog 5 units tidwc (home dose)  Will continue to follow. Thank you. Lorenda Peck, RD, LDN, CDE Inpatient Diabetes Coordinator [phone]

## 2015-01-04 DIAGNOSIS — R188 Other ascites: Secondary | ICD-10-CM | POA: Diagnosis not present

## 2015-01-04 DIAGNOSIS — K7031 Alcoholic cirrhosis of liver with ascites: Secondary | ICD-10-CM | POA: Diagnosis not present

## 2015-01-04 DIAGNOSIS — E43 Unspecified severe protein-calorie malnutrition: Secondary | ICD-10-CM

## 2015-01-04 DIAGNOSIS — R1011 Right upper quadrant pain: Secondary | ICD-10-CM | POA: Diagnosis not present

## 2015-01-04 LAB — CBC
HCT: 38 % (ref 36.0–46.0)
Hemoglobin: 12.8 g/dL (ref 12.0–15.0)
MCH: 35.3 pg — ABNORMAL HIGH (ref 26.0–34.0)
MCHC: 33.7 g/dL (ref 30.0–36.0)
MCV: 104.7 fL — ABNORMAL HIGH (ref 78.0–100.0)
PLATELETS: 63 10*3/uL — AB (ref 150–400)
RBC: 3.63 MIL/uL — AB (ref 3.87–5.11)
RDW: 14.4 % (ref 11.5–15.5)
WBC: 2.3 10*3/uL — AB (ref 4.0–10.5)

## 2015-01-04 LAB — HEMOGLOBIN A1C
Hgb A1c MFr Bld: 10.4 % — ABNORMAL HIGH (ref 4.8–5.6)
MEAN PLASMA GLUCOSE: 252 mg/dL

## 2015-01-04 LAB — GRAM STAIN

## 2015-01-04 LAB — BASIC METABOLIC PANEL
ANION GAP: 6 (ref 5–15)
BUN: 8 mg/dL (ref 6–20)
CALCIUM: 8.3 mg/dL — AB (ref 8.9–10.3)
CHLORIDE: 95 mmol/L — AB (ref 101–111)
CO2: 34 mmol/L — AB (ref 22–32)
Creatinine, Ser: 0.49 mg/dL (ref 0.44–1.00)
GFR calc Af Amer: >60 mL/min (ref >60)
GFR calc non Af Amer: >60 mL/min (ref >60)
GLUCOSE: 239 mg/dL — AB (ref 65–99)
POTASSIUM: 3.6 mmol/L (ref 3.5–5.1)
Sodium: 135 mmol/L (ref 135–145)

## 2015-01-04 LAB — GLUCOSE, CAPILLARY
GLUCOSE-CAPILLARY: 169 mg/dL — AB (ref 65–99)
GLUCOSE-CAPILLARY: 398 mg/dL — AB (ref 65–99)
GLUCOSE-CAPILLARY: 483 mg/dL — AB (ref 65–99)
Glucose-Capillary: 230 mg/dL — ABNORMAL HIGH (ref 65–99)

## 2015-01-04 LAB — PATHOLOGIST SMEAR REVIEW

## 2015-01-04 MED ORDER — FUROSEMIDE 40 MG PO TABS
40.0000 mg | ORAL_TABLET | Freq: Every day | ORAL | Status: DC
  Filled 2015-01-04: qty 1

## 2015-01-04 MED ORDER — SPIRONOLACTONE 100 MG PO TABS
100.0000 mg | ORAL_TABLET | Freq: Every day | ORAL | Status: DC
  Filled 2015-01-04: qty 1

## 2015-01-04 MED ORDER — SPIRONOLACTONE 100 MG PO TABS
100.0000 mg | ORAL_TABLET | Freq: Every day | ORAL | Status: DC
Start: 2015-01-05 — End: 2015-04-03

## 2015-01-04 MED ORDER — SPIRONOLACTONE 50 MG PO TABS: 50.0000 mg | ORAL_TABLET | Freq: Every day | ORAL | Status: DC

## 2015-01-04 MED ORDER — INSULIN ASPART 100 UNIT/ML ~~LOC~~ SOLN
12.0000 [IU] | Freq: Once | SUBCUTANEOUS | Status: AC
  Administered 2015-01-04: 12 [IU] via SUBCUTANEOUS

## 2015-01-04 MED ORDER — METFORMIN HCL 500 MG PO TABS
500.0000 mg | ORAL_TABLET | Freq: Two times a day (BID) | ORAL | Status: DC
  Administered 2015-01-04: 500 mg via ORAL
  Filled 2015-01-04 (×4): qty 1

## 2015-01-04 MED ORDER — METFORMIN HCL 500 MG PO TABS: 500.0000 mg | ORAL_TABLET | Freq: Two times a day (BID) | ORAL | Status: DC

## 2015-01-04 MED ORDER — HYDROCODONE-ACETAMINOPHEN 5-325 MG PO TABS
1.0000 | ORAL_TABLET | ORAL | Status: DC | PRN
  Administered 2015-01-04 (×2): 2 via ORAL
  Filled 2015-01-04 (×2): qty 2

## 2015-01-04 MED ORDER — OXYCODONE HCL 5 MG PO TABS: 5.0000 mg | ORAL_TABLET | Freq: Four times a day (QID) | ORAL | Status: DC | PRN

## 2015-01-04 NOTE — Progress Notes (Signed)
Patient ID: [name], female   DOB: 10/08/1965, 49 y.o.   MRN: [id number]    Referring Physician(s): TRH  Chief Complaint:  Cirrhosis, recurrent ascites  Subjective:  Pt s/p paracentesis 9/13 with small postprocedure hematoma RLQ; currently with some soreness RLQ but not worse, no referred pain; denies N/V; last CBG 483  Allergies: Betadine  Medications: Prior to Admission medications   Medication Sig Start Date End Date Taking? Authorizing Provider  diphenhydrAMINE (BENADRYL) 25 MG tablet Take 50 mg by mouth at bedtime as needed for sleep.    Yes Historical Provider, MD  docusate sodium (COLACE) 100 MG capsule Take 1 capsule (100 mg total) by mouth 2 (two) times daily. 11/15/14  Yes Simbiso Ranga, MD  esomeprazole (NEXIUM) 40 MG capsule Take 40 mg by mouth daily as needed (heartburn).   Yes Historical Provider, MD  furosemide (LASIX) 40 MG tablet Take 1 tablet (40 mg total) by mouth daily. 11/15/14  Yes Simbiso Ranga, MD  ibuprofen (ADVIL,MOTRIN) 200 MG tablet Take 600 mg by mouth every 6 (six) hours as needed for mild pain or moderate pain.   Yes Historical Provider, MD  insulin aspart (NOVOLOG) 100 UNIT/ML injection Inject 3 Units into the skin 3 (three) times daily before meals. Patient taking differently: Inject 5 Units into the skin 3 (three) times daily with meals.  07/26/14  Yes Belkys A Regalado, MD  insulin glargine (LANTUS) 100 UNIT/ML injection Inject 0.15 mLs (15 Units total) into the skin at bedtime. Patient taking differently: Inject 15 Units into the skin at bedtime. At 10pm 07/26/14  Yes Belkys A Regalado, MD  metFORMIN (GLUCOPHAGE) 500 MG tablet Take 500 mg by mouth 2 (two) times daily. 10/25/14  Yes Historical Provider, MD  Multiple Vitamin (MULTIVITAMIN WITH MINERALS) TABS tablet Take 1 tablet by mouth daily.    Yes Historical Provider, MD  potassium chloride (K-DUR) 10 MEQ tablet Take 1 tablet (10 mEq total) by mouth daily. 11/15/14  Yes Simbiso Ranga, MD  sertraline  (ZOLOFT) 100 MG tablet Take 150 mg by mouth daily.   Yes Historical Provider, MD  oxyCODONE (OXY IR/ROXICODONE) 5 MG immediate release tablet Take 1 tablet (5 mg total) by mouth every 6 (six) hours as needed for severe pain. 01/04/15   Charlynne Cousins, MD  spironolactone (ALDACTONE) 100 MG tablet Take 1 tablet (100 mg total) by mouth daily. 01/05/15   Charlynne Cousins, MD  spironolactone (ALDACTONE) 50 MG tablet Take 1 tablet (50 mg total) by mouth daily. Patient not taking: Reported on 01/02/2015 11/15/14   Nat Math, MD     Vital Signs: BP 96/60 mmHg  Pulse 69  Temp(Src) 99 F (37.2 C) (Oral)  Resp 18  Ht 5\' 2"  (1.575 m)  Wt 110 lb (49.896 kg)  BMI 20.11 kg/m2  SpO2 96%  LMP 11/01/2011  Physical Exam puncture site RLQ clean and dry, stable small hematoma without ecchymosis, mild- mod tender to deep palpation, no active bleeding noted  Imaging: Ct Abdomen Pelvis W Contrast  01/02/2015   CLINICAL DATA:  49 year old female with abdominal pain nausea and vomiting.  EXAM: CT ABDOMEN AND PELVIS WITH CONTRAST  TECHNIQUE: Multidetector CT imaging of the abdomen and pelvis was performed using the standard protocol following bolus administration of intravenous contrast.  CONTRAST:  146mL OMNIPAQUE IOHEXOL 300 MG/ML  SOLN  COMPARISON:  CT 12/20/2014  FINDINGS: The visualized lung bases are clear. No intra-abdominal free air. Small ascites, increased from prior study.  Cirrhosis. Gallstone. No intrahepatic  biliary ductal dilatation. The pancreas appears unremarkable. Stable mild splenomegaly measuring 14 cm. The adrenal glands, kidneys, visualized ureters, and urinary bladder appear unremarkable. Hysterectomy.  There is apparent thickening of the wall of the stomach likely related to underdistention. Gastritis is less likely. Clinical correlation is recommended. No bowel obstruction. Appendectomy.  The abdominal aorta and IVC appear patent. No portal venous gas identified. Kidneys portal vein  and intrahepatic branches are patent. The splenic vein patent. There is recanalization of the paraumbilical vein compatible with portal hypertension. There is prominence and improvement of the mesentery vasculature. There is no adenopathy. Mild degenerative changes of the spine. No acute fracture. Diffuse subcutaneous soft tissue edema and anasarca.  IMPRESSION: Cirrhosis with portal hypertension, splenomegaly, and small ascites. There has been interval increase in the size of the ascites compared to prior study.  Cholelithiasis.  No evidence of bowel obstruction.   Electronically Signed   By: Anner Crete M.D.   On: 01/02/2015 23:01   US Paracentesis  01/04/2015   INDICATION: Hepatitis-C, cirrhosis, pancytopenia, recurrent ascites. Request is made for diagnostic and therapeutic paracentesis.  EXAM: ULTRASOUND-GUIDED DIAGNOSTIC AND THERAPEUTIC PARACENTESIS  COMPARISON:  Prior paracentesis on 11/07/2014  MEDICATIONS: None.  COMPLICATIONS: None immediate  TECHNIQUE: Informed written consent was obtained from the patient after a discussion of the risks, benefits and alternatives to treatment. A timeout was performed prior to the initiation of the procedure.  Initial ultrasound scanning demonstrates a small to moderate amount of ascites within the right lower abdominal quadrant. The right lower abdomen was prepped and draped in the usual sterile fashion. 1% lidocaine was used for local anesthesia. Under direct ultrasound guidance, a 19 gauge, 7-cm, Yueh catheter was introduced. An ultrasound image was saved for documentation purposed. The paracentesis was performed. The catheter was removed and a dressing was applied. Postprocedure the patient developed a small hematoma the puncture site. Manual pressure was held for 20 minutes. Area was evaluated with ultrasound to confirm stability prior to transfer to to floor.  FINDINGS: A total of approximately 1.5 liters of slightly hazy, yellow fluid was removed. Samples  were sent to the laboratory as requested by the clinical team.  IMPRESSION: Successful ultrasound-guided paracentesis yielding 1.5 liters of peritoneal fluid. Patient developed small puncture site hematoma postprocedure. Manual pressure held time 20 minutes. Vital signs remained stable throughout procedure. Patient was examined by Dr. Laurence Ferrari and puncture site re-evaluated with ultrasound postprocedure to confirm stability of hematoma prior to transfer to floor. Recommend 2 hour bedrest period with frequent vital sign/symptom monitoring and CBC recheck. Dr. Terrill Mohr informed of above.  Read by: Rowe Robert, PA-C   Electronically Signed   By: Jacqulynn Cadet M.D.   On: 01/03/2015 18:09    Labs:  CBC:  Recent Labs  01/02/15 1802 01/03/15 0640 01/03/15 1849 01/04/15 0455  WBC 3.9* 2.4* 2.1* 2.3*  HGB 14.8 14.4 14.1 12.8  HCT 42.4 42.4 42.1 38.0  PLT 72* 63* 66* 63*    COAGS:  Recent Labs  02/13/14 2108 02/14/14 1700  11/08/14 0355 11/10/14 0418 01/02/15 2058 01/03/15 0640  INR 1.16 1.22  < > 1.20 1.32 1.22 1.27  APTT 28 29  --   --   --  29  --   < > = values in this interval not displayed.  BMP:  Recent Labs  12/20/14 1621 01/02/15 1802 01/03/15 0640 01/04/15 0455  NA 136 129* 132* 135  K 4.6 3.7 3.1* 3.6  CL 95* 91* 96* 95*  CO2  28 28 26  34*  GLUCOSE 454* 486* 255* 239*  BUN 8 12 8 8   CALCIUM 9.2 8.8* 8.4* 8.3*  CREATININE 0.49 0.61 0.44 0.49  GFRNONAA >60 >60 >60 >60  GFRAA >60 >60 >60 >60    LIVER FUNCTION TESTS:  Recent Labs  11/13/14 0458 11/14/14 0416 12/20/14 1804 01/02/15 1802  BILITOT 1.9* 1.9* 1.9* 2.0*  AST 128* 131* 162* 127*  ALT 105* 104* 124* 97*  ALKPHOS 98 91 125 115  PROT 6.9 7.1 8.3* 7.5  ALBUMIN 2.7* 2.8* 3.6 3.1*    Assessment and Plan:  S/p paracentesis 9/13 with small postprocedure hematoma RLQ; hgb down slightly (12.8 from 14.1) today but still WNL; site stable; VSS; AF; CBG elevated- tx per primary  Signed: D.  Rowe Robert 01/04/2015, 2:45 PM   I spent a total of 15 minutes at the the patient's bedside AND on the patient's hospital floor or unit, greater than 50% of which was counseling/coordinating care for post paracentesis

## 2015-01-04 NOTE — Progress Notes (Signed)
MD paged due to pt CBG level. New orders received.

## 2015-01-04 NOTE — Care Management Note (Signed)
Case Management Note  Patient Details  Name: [name] MRN: [id number] Date of Birth: [birth year]  Subjective/Objective:                   abdominal pain Action/Plan: Discharge planning  Expected Discharge Date:  01/05/15               Expected Discharge Plan:  Home/Self Care  In-House Referral:     Discharge planning Services  CM Consult  Post Acute Care Choice:    Choice offered to:     DME Arranged:    DME Agency:     HH Arranged:    [place] Agency:     Status of Service:  Completed, signed off  Medicare Important Message Given:    Date Medicare IM Given:    Medicare IM give by:    Date Additional Medicare IM Given:    Additional Medicare Important Message give by:     If discussed at [place] of Stay Meetings, dates discussed:    Additional Comments: CM notes pt has PCP, Medicaid pending, and no HH services recc or ordered; pt discharging self care.  No other CM needs were communicated. Dellie Catholic, RN 01/04/2015, 12:26 PM

## 2015-01-04 NOTE — Discharge Summary (Signed)
Physician Discharge Summary  [name] WUJ:[id number] DOB: Sep 14, 1965 DOA: 01/02/2015  PCP: Cathlean Cower, MD  Admit date: 01/02/2015 Discharge date: 01/04/2015  Time spent: 35 minutes  Recommendations for Outpatient Follow-up:  1. Follow-up with primary care doctor in 1-2 weeks check a basic metabolic panel to monitor her potassium. Patient she understands fluid restriction.  Discharge Diagnoses:  Principal Problem:   Abdominal pain Active Problems:   Acute hepatitis C virus infection   Cirrhosis of liver   Ascites   Protein-calorie malnutrition, severe   Discharge Condition: stable  Diet recommendation: regular  Filed Weights   01/03/15 0500  Weight: 49.896 kg (110 lb)    History of present illness:  49 year old with past medical history of cirrhosis and ascites due to alcohol abuse appetite as he that comes in complaining of abdominal girth for the last 7 days  Hospital Course:  Abdominal pain due to distention due to ascites: CT scan of the abdomen and pelvis report small ascites, IR was consulted and paracentesis was ordered that showed a less then 10 PMN. She developed a mild hematoma after paracentesis, this probably due to thrombocytopenia and platelet dysfunction as she has been taking ibuprofen for pain. Her Lasix was continued her spironolactone was increase her potassium was stopped. The potassium and the basic metabolic panel remained stable. She'll follow-up with her primary care doctor in a week we'll check a be met to monitor potassium. She will need an abdominal ultrasound follow-up of her liver disease as she has hepatitis C and has ongoing alcohol abuse. Instructed to stop the ibuprofen and she was given a refill of oxycodone.  Procedures:  CT scan of the abdomen pelvis  Abdominal ultrasound guided paracentesis  Consultations:  None  Discharge Exam: Filed Vitals:   01/04/15 0604  BP: 108/71  Pulse: 85  Temp: 98.2 F (36.8 C)  Resp: 20     General: Awake alert and oriented 3 Cardiovascular: Regular rate and rhythm Respiratory: Air movement clear to auscultation  Discharge Instructions   Discharge Instructions    Diet - low sodium heart healthy    Complete by:  As directed      Increase activity slowly    Complete by:  As directed           Current Discharge Medication List    CONTINUE these medications which have CHANGED   Details  oxyCODONE (OXY IR/ROXICODONE) 5 MG immediate release tablet Take 1 tablet (5 mg total) by mouth every 6 (six) hours as needed for severe pain. Qty: 30 tablet, Refills: 0    spironolactone (ALDACTONE) 100 MG tablet Take 1 tablet (100 mg total) by mouth daily. Qty: 30 tablet, Refills: 0      CONTINUE these medications which have NOT CHANGED   Details  diphenhydrAMINE (BENADRYL) 25 MG tablet Take 50 mg by mouth at bedtime as needed for sleep.     docusate sodium (COLACE) 100 MG capsule Take 1 capsule (100 mg total) by mouth 2 (two) times daily. Qty: 10 capsule, Refills: 0    esomeprazole (NEXIUM) 40 MG capsule Take 40 mg by mouth daily as needed (heartburn).    furosemide (LASIX) 40 MG tablet Take 1 tablet (40 mg total) by mouth daily. Qty: 30 tablet, Refills: 0    insulin aspart (NOVOLOG) 100 UNIT/ML injection Inject 3 Units into the skin 3 (three) times daily before meals. Qty: 10 mL, Refills: 11    insulin glargine (LANTUS) 100 UNIT/ML injection Inject 0.15 mLs (15 Units  total) into the skin at bedtime. Qty: 10 mL, Refills: 11    metFORMIN (GLUCOPHAGE) 500 MG tablet Take 500 mg by mouth 2 (two) times daily.    Multiple Vitamin (MULTIVITAMIN WITH MINERALS) TABS tablet Take 1 tablet by mouth daily.     sertraline (ZOLOFT) 100 MG tablet Take 150 mg by mouth daily.      STOP taking these medications     ibuprofen (ADVIL,MOTRIN) 200 MG tablet      potassium chloride (K-DUR) 10 MEQ tablet        Allergies  Allergen Reactions  . Betadine [Povidone Iodine] Hives    Follow-up Information    Follow up with Cathlean Cower, MD In 2 weeks.   Specialties:  Internal Medicine, Radiology   Why:  hospital follow up   Contact information:   [place] [address] [phone]        The results of significant diagnostics from this hospitalization (including imaging, microbiology, ancillary and laboratory) are listed below for reference.    Significant Diagnostic Studies: Ct Abdomen Pelvis W Contrast  01/02/2015   CLINICAL DATA:  49 year old female with abdominal pain nausea and vomiting.  EXAM: CT ABDOMEN AND PELVIS WITH CONTRAST  TECHNIQUE: Multidetector CT imaging of the abdomen and pelvis was performed using the standard protocol following bolus administration of intravenous contrast.  CONTRAST:  185mL OMNIPAQUE IOHEXOL 300 MG/ML  SOLN  COMPARISON:  CT 12/20/2014  FINDINGS: The visualized lung bases are clear. No intra-abdominal free air. Small ascites, increased from prior study.  Cirrhosis. Gallstone. No intrahepatic biliary ductal dilatation. The pancreas appears unremarkable. Stable mild splenomegaly measuring 14 cm. The adrenal glands, kidneys, visualized ureters, and urinary bladder appear unremarkable. Hysterectomy.  There is apparent thickening of the wall of the stomach likely related to underdistention. Gastritis is less likely. Clinical correlation is recommended. No bowel obstruction. Appendectomy.  The abdominal aorta and IVC appear patent. No portal venous gas identified. Kidneys portal vein and intrahepatic branches are patent. The splenic vein patent. There is recanalization of the paraumbilical vein compatible with portal hypertension. There is prominence and improvement of the mesentery vasculature. There is no adenopathy. Mild degenerative changes of the spine. No acute fracture. Diffuse subcutaneous soft tissue edema and anasarca.  IMPRESSION: Cirrhosis with portal hypertension, splenomegaly, and small ascites. There has been  interval increase in the size of the ascites compared to prior study.  Cholelithiasis.  No evidence of bowel obstruction.   Electronically Signed   By: Anner Crete M.D.   On: 01/02/2015 23:01   Ct Abdomen Pelvis W Contrast  12/20/2014   CLINICAL DATA:  Progressive rash involving the arms and legs for 3 days. Hyperglycemia with occasional vomiting. History of cirrhosis. Initial encounter.  EXAM: CT ABDOMEN AND PELVIS WITH CONTRAST  TECHNIQUE: Multidetector CT imaging of the abdomen and pelvis was performed using the standard protocol following bolus administration of intravenous contrast.  CONTRAST:  46mL OMNIPAQUE IOHEXOL 300 MG/ML SOLN, 145mL OMNIPAQUE IOHEXOL 300 MG/ML SOLN  COMPARISON:  MRI 11/12/2014.  CT 11/07/2014.  FINDINGS: Lower chest: Clear lung bases. No significant pleural or pericardial effusion.  Hepatobiliary: Again demonstrated are diffuse changes of cirrhosis with contour irregularity and capsular contraction inferiorly in the right lobe. The liver appears heterogeneous on the delayed post-contrast images, but no focal lesion or abnormal enhancement identified. Stable small calcified gallstone and gallbladder wall thickening, likely related to chronic liver disease. No significant biliary dilatation.  Pancreas: Unremarkable. No pancreatic ductal dilatation  or surrounding inflammatory changes.  Spleen: Stable mild splenomegaly with the spleen measuring up to 13.6 cm in height. No focal abnormality identified.  Adrenals/Urinary Tract: Both adrenal glands appear normal. There are tiny nonobstructing calculi in the upper and lower poles of the left kidney. There is a tiny cyst in the upper pole of the left kidney. The right kidney appears normal. No evidence of hydronephrosis or ureteral calculus. The bladder appears normal.  Stomach/Bowel: No evidence of bowel wall thickening, distention or surrounding inflammatory change. Retrocecal surgical clips consistent with prior appendectomy. Moderate  stool throughout the colon.  Vascular/Lymphatic: Stable prominent lymph nodes within the porta hepatis attributed to chronic liver disease. No retroperitoneal lymphadenopathy. Stable mild aortoiliac atherosclerosis and small perisplenic varices.  Reproductive: Hysterectomy.  No evidence of adnexal mass.  Other: Ascites has improved compared with the prior CT with minimal residual perihepatic fluid.  Musculoskeletal: No acute or significant osseous findings.  IMPRESSION: 1. No acute findings or explanation for the patient's symptoms. 2. Generally stable appearance of the liver with diffuse contour irregularity and heterogeneity consistent with cirrhosis. No focal lesion identified. 3. Cholelithiasis and chronic gallbladder wall thickening, likely related to chronic liver disease. 4. Splenomegaly and perisplenic varices again noted. Ascites has improved.   Electronically Signed   By: Richardean Sale M.D.   On: 12/20/2014 19:52   US Paracentesis  01/04/2015   INDICATION: Hepatitis-C, cirrhosis, pancytopenia, recurrent ascites. Request is made for diagnostic and therapeutic paracentesis.  EXAM: ULTRASOUND-GUIDED DIAGNOSTIC AND THERAPEUTIC PARACENTESIS  COMPARISON:  Prior paracentesis on 11/07/2014  MEDICATIONS: None.  COMPLICATIONS: None immediate  TECHNIQUE: Informed written consent was obtained from the patient after a discussion of the risks, benefits and alternatives to treatment. A timeout was performed prior to the initiation of the procedure.  Initial ultrasound scanning demonstrates a small to moderate amount of ascites within the right lower abdominal quadrant. The right lower abdomen was prepped and draped in the usual sterile fashion. 1% lidocaine was used for local anesthesia. Under direct ultrasound guidance, a 19 gauge, 7-cm, Yueh catheter was introduced. An ultrasound image was saved for documentation purposed. The paracentesis was performed. The catheter was removed and a dressing was applied.  Postprocedure the patient developed a small hematoma the puncture site. Manual pressure was held for 20 minutes. Area was evaluated with ultrasound to confirm stability prior to transfer to to floor.  FINDINGS: A total of approximately 1.5 liters of slightly hazy, yellow fluid was removed. Samples were sent to the laboratory as requested by the clinical team.  IMPRESSION: Successful ultrasound-guided paracentesis yielding 1.5 liters of peritoneal fluid. Patient developed small puncture site hematoma postprocedure. Manual pressure held time 20 minutes. Vital signs remained stable throughout procedure. Patient was examined by Dr. Laurence Ferrari and puncture site re-evaluated with ultrasound postprocedure to confirm stability of hematoma prior to transfer to floor. Recommend 2 hour bedrest period with frequent vital sign/symptom monitoring and CBC recheck. Dr. Terrill Mohr informed of above.  Read by: Rowe Robert, PA-C   Electronically Signed   By: Jacqulynn Cadet M.D.   On: 01/03/2015 18:09    Microbiology: Recent Results (from the past 240 hour(s))  Culture, body fluid-bottle     Status: None (Preliminary result)   Collection Time: 01/03/15  5:28 PM  Result Value Ref Range Status   Specimen Description FLUID  Final   Special Requests   Final    SEROUS BOTTLES DRAWN AEROBIC AND ANAEROBIC Performed at [facility]    Culture PENDING  Incomplete  Report Status PENDING  Incomplete  Gram stain     Status: None   Collection Time: 01/03/15  5:28 PM  Result Value Ref Range Status   Specimen Description FLUID  Final   Special Requests SEROUS  Final   Gram Stain   Final    RARE WBC PRESENT, PREDOMINANTLY MONONUCLEAR NO ORGANISMS SEEN GRAM STAIN REVIEWED-AGREE WITH RESULT M KELLY Performed at [facility]    Report Status 01/04/2015 FINAL  Final     Labs: Basic Metabolic Panel:  Recent Labs Lab 01/02/15 1802 01/03/15 0640 01/04/15 0455  NA 129* 132* 135  K 3.7 3.1* 3.6  CL  91* 96* 95*  CO2 28 26 34*  GLUCOSE 486* 255* 239*  BUN _0 CREATININE 0.61 0.44 0.49  CALCIUM 8.8* 8.4* 8.3*   Liver Function Tests:  Recent Labs Lab 01/02/15 1802  AST 127*  ALT 97*  ALKPHOS 115  BILITOT 2.0*  PROT 7.5  ALBUMIN 3.1*    Recent Labs Lab 01/02/15 1802  LIPASE 79*   No results for input(s): AMMONIA in the last 168 hours. CBC:  Recent Labs Lab 01/02/15 1802 01/03/15 0640 01/03/15 1849 01/04/15 0455  WBC 3.9* 2.4* 2.1* 2.3*  HGB 14.8 14.4 14.1 12.8  HCT 42.4 42.4 42.1 38.0  MCV 104.2* 105.0* 105.3* 104.7*  PLT 72* 63* 66* 63*   Cardiac Enzymes: No results for input(s): CKTOTAL, CKMB, CKMBINDEX, TROPONINI in the last 168 hours. BNP: BNP (last 3 results) No results for input(s): BNP in the last 8760 hours.  ProBNP (last 3 results) No results for input(s): PROBNP in the last 8760 hours.  CBG:  Recent Labs Lab 01/02/15 2335 01/03/15 1038 01/03/15 1750 01/03/15 2105 01/04/15 0804  GLUCAP 351* 224* 297* 347* 169*       Signed:  FELIZ ORTIZ, Aloys Hupfer  [facility] 01/04/2015, 11:25 AM

## 2015-01-08 LAB — CULTURE, BODY FLUID W GRAM STAIN -BOTTLE: Culture: NO GROWTH

## 2015-01-13 ENCOUNTER — Other Ambulatory Visit: Payer: Self-pay | Admitting: Internal Medicine

## 2015-01-14 ENCOUNTER — Emergency Department (HOSPITAL_COMMUNITY)
Admission: EM | Admit: 2015-01-14 | Discharge: 2015-01-15 | Disposition: A | Discharge status: Home / Self Care | Payer: Medicaid Other | Attending: Emergency Medicine | Admitting: Emergency Medicine

## 2015-01-14 ENCOUNTER — Encounter (HOSPITAL_COMMUNITY): Payer: Self-pay

## 2015-01-14 DIAGNOSIS — K219 Gastro-esophageal reflux disease without esophagitis: Secondary | ICD-10-CM | POA: Insufficient documentation

## 2015-01-14 DIAGNOSIS — R109 Unspecified abdominal pain: Secondary | ICD-10-CM | POA: Diagnosis present

## 2015-01-14 DIAGNOSIS — Z79899 Other long term (current) drug therapy: Secondary | ICD-10-CM | POA: Insufficient documentation

## 2015-01-14 DIAGNOSIS — F419 Anxiety disorder, unspecified: Secondary | ICD-10-CM | POA: Insufficient documentation

## 2015-01-14 DIAGNOSIS — F322 Major depressive disorder, single episode, severe without psychotic features: Secondary | ICD-10-CM | POA: Diagnosis not present

## 2015-01-14 DIAGNOSIS — F1010 Alcohol abuse, uncomplicated: Secondary | ICD-10-CM | POA: Diagnosis not present

## 2015-01-14 DIAGNOSIS — G8929 Other chronic pain: Secondary | ICD-10-CM | POA: Diagnosis not present

## 2015-01-14 DIAGNOSIS — Z87448 Personal history of other diseases of urinary system: Secondary | ICD-10-CM | POA: Insufficient documentation

## 2015-01-14 DIAGNOSIS — E1165 Type 2 diabetes mellitus with hyperglycemia: Secondary | ICD-10-CM | POA: Diagnosis not present

## 2015-01-14 DIAGNOSIS — Z794 Long term (current) use of insulin: Secondary | ICD-10-CM | POA: Diagnosis not present

## 2015-01-14 DIAGNOSIS — Z8619 Personal history of other infectious and parasitic diseases: Secondary | ICD-10-CM | POA: Insufficient documentation

## 2015-01-14 DIAGNOSIS — R739 Hyperglycemia, unspecified: Secondary | ICD-10-CM

## 2015-01-14 LAB — LIPASE, BLOOD: LIPASE: 48 U/L (ref 22–51)

## 2015-01-14 LAB — COMPREHENSIVE METABOLIC PANEL
ALK PHOS: 122 U/L (ref 38–126)
ALT: 97 U/L — ABNORMAL HIGH (ref 14–54)
ANION GAP: 10 (ref 5–15)
AST: 134 U/L — ABNORMAL HIGH (ref 15–41)
Albumin: 3.6 g/dL (ref 3.5–5.0)
BILIRUBIN TOTAL: 1.6 mg/dL — AB (ref 0.3–1.2)
BUN: 5 mg/dL — ABNORMAL LOW (ref 6–20)
CALCIUM: 9.5 mg/dL (ref 8.9–10.3)
CO2: 30 mmol/L (ref 22–32)
CREATININE: 0.53 mg/dL (ref 0.44–1.00)
Chloride: 101 mmol/L (ref 101–111)
Glucose, Bld: 406 mg/dL — ABNORMAL HIGH (ref 65–99)
Potassium: 4.7 mmol/L (ref 3.5–5.1)
SODIUM: 141 mmol/L (ref 135–145)
TOTAL PROTEIN: 8.6 g/dL — AB (ref 6.5–8.1)

## 2015-01-14 LAB — CBC WITH DIFFERENTIAL/PLATELET
Basophils Absolute: 0 10*3/uL (ref 0.0–0.1)
Basophils Relative: 1 %
EOS ABS: 0.1 10*3/uL (ref 0.0–0.7)
Eosinophils Relative: 2 %
HCT: 45 % (ref 36.0–46.0)
HEMOGLOBIN: 15.4 g/dL — AB (ref 12.0–15.0)
LYMPHS ABS: 1.4 10*3/uL (ref 0.7–4.0)
LYMPHS PCT: 37 %
MCH: 35.7 pg — AB (ref 26.0–34.0)
MCHC: 34.2 g/dL (ref 30.0–36.0)
MCV: 104.4 fL — AB (ref 78.0–100.0)
MONOS PCT: 12 %
Monocytes Absolute: 0.5 10*3/uL (ref 0.1–1.0)
NEUTROS PCT: 50 %
Neutro Abs: 2 10*3/uL (ref 1.7–7.7)
Platelets: 90 10*3/uL — ABNORMAL LOW (ref 150–400)
RBC: 4.31 MIL/uL (ref 3.87–5.11)
RDW: 14.5 % (ref 11.5–15.5)
WBC: 3.9 10*3/uL — ABNORMAL LOW (ref 4.0–10.5)

## 2015-01-14 LAB — CBG MONITORING, ED: GLUCOSE-CAPILLARY: 444 mg/dL — AB (ref 65–99)

## 2015-01-14 LAB — BLOOD GAS, VENOUS
Acid-Base Excess: 5.3 mmol/L — ABNORMAL HIGH (ref 0.0–2.0)
BICARBONATE: 31.8 meq/L — AB (ref 20.0–24.0)
FIO2: 0.21
O2 Saturation: 22.5 %
PH VEN: 7.372 — AB (ref 7.250–7.300)
PO2 VEN: 20 mmHg — AB (ref 30.0–45.0)
Patient temperature: 99
TCO2: 28.5 mmol/L (ref 0–100)
pCO2, Ven: 56.2 mmHg — ABNORMAL HIGH (ref 45.0–50.0)

## 2015-01-14 LAB — ETHANOL: ALCOHOL ETHYL (B): 352 mg/dL — AB (ref <5)

## 2015-01-14 LAB — AMMONIA: Ammonia: 43 umol/L — ABNORMAL HIGH (ref 9–35)

## 2015-01-14 MED ORDER — FENTANYL CITRATE (PF) 100 MCG/2ML IJ SOLN
50.0000 ug | Freq: Once | INTRAMUSCULAR | Status: AC
  Administered 2015-01-14: 50 ug via INTRAVENOUS
  Filled 2015-01-14: qty 2

## 2015-01-14 MED ORDER — SODIUM CHLORIDE 0.9 % IV BOLUS (SEPSIS)
1000.0000 mL | Freq: Once | INTRAVENOUS | Status: AC
  Administered 2015-01-14: 1000 mL via INTRAVENOUS

## 2015-01-14 MED ORDER — SODIUM CHLORIDE 0.9 % IV BOLUS (SEPSIS)
1000.0000 mL | Freq: Once | INTRAVENOUS | Status: AC
  Administered 2015-01-15: 1000 mL via INTRAVENOUS

## 2015-01-14 NOTE — ED Provider Notes (Signed)
CSN: [id number]     Arrival date & time 01/14/15  2025 History   First MD Initiated Contact with Patient 01/14/15 2032     Chief Complaint  Patient presents with  . Abdominal Pain     (Consider location/radiation/quality/duration/timing/severity/associated sxs/prior Treatment) HPI Comments: Pt comes in with c/o chronic abdominal pain. Nothing with the pain that she is having is new.She states that she has been drinking for the last couple of days. She denies fever. She states that she was in the hospital a couple of week ago and he had an paracentesis. She states that she was told that she may have liver cancer and she just want to know if she does. Denies vomiting or diarrhea.  The history is provided by the patient. No language interpreter was used.    Past Medical History  Diagnosis Date  . Impaired glucose tolerance 11/07/2010  . Hepatitis C approx dx 2005    no tx to date  . Pancreatitis     HX of  . Alcohol dependency     at least [place] admissions 2007- 09/2013 for detox.   Marland Kitchen GERD (gastroesophageal reflux disease)   . Chronic constipation   . Gallstones   . Drug dependency     Hx of cocaine use  . Anxiety and depression 08/2011  . Rheumatoid factor positive   . Hemochromatosis     last phlebotomy tx ~ 2012. liver bx 2006  . Pancytopenia   . Abnormal vaginal bleeding     uterine fibroid  . Gallstone 2006  . Chronic abdominal pain 2011  . Cirrhosis 11/09/2010  . Type II or unspecified type diabetes mellitus without mention of complication, uncontrolled 09/17/2011  . Major depression 11/2012, 09/2013    [facility] admissions for this, suicide attempt by OD Ambien (2014), Prozac (2015),ETOHism   . Anxiety    Past Surgical History  Procedure Laterality Date  . Appendectomy    . Cesarean section  x 2  . Foot surgery  2013  . Abdominal hysterectomy  12/17/2011    Procedure: HYSTERECTOMY ABDOMINAL;  Surgeon: Gus Height, MD;  Location: [facility];  Service: Gynecology;  Laterality: N/A;   . Salpingoophorectomy  12/17/2011    Procedure: SALPINGO OOPHERECTOMY;  Surgeon: Gus Height, MD;  Location: [facility];  Service: Gynecology;  Laterality: Bilateral;  . Esophagogastroduodenoscopy N/A 12/10/2013    Procedure: ESOPHAGOGASTRODUODENOSCOPY (EGD);  Surgeon: Ladene Artist, MD;  Location: [facility];  Service: Endoscopy;  Laterality: N/A;  . Percutaneous liver biopsy  2011  . Colonoscopy N/A 02/18/2014    Procedure: COLONOSCOPY;  Surgeon: Gatha Mayer, MD;  Location: [facility];  Service: Endoscopy;  Laterality: N/A;   Family History  Problem Relation Age of Onset  . Cancer Mother     ovarian  . Diabetes Father    Social History  Substance Use Topics  . Smoking status: Never Smoker   . Smokeless tobacco: Never Used  . Alcohol Use: Yes     Comment: one pint vodka daily years   OB History    Gravida Para Term Preterm AB TAB SAB Ectopic Multiple Living   2 2 2       3      Review of Systems  All other systems reviewed and are negative.     Allergies  Betadine  Home Medications   Prior to Admission medications   Medication Sig Start Date End Date Taking? Authorizing Provider  diphenhydrAMINE (BENADRYL) 25 MG tablet Take 50 mg by  mouth at bedtime as needed for sleep.     Historical Provider, MD  docusate sodium (COLACE) 100 MG capsule Take 1 capsule (100 mg total) by mouth 2 (two) times daily. 11/15/14   Simbiso Ranga, MD  esomeprazole (NEXIUM) 40 MG capsule Take 40 mg by mouth daily as needed (heartburn).    Historical Provider, MD  furosemide (LASIX) 40 MG tablet Take 1 tablet (40 mg total) by mouth daily. 11/15/14   Simbiso Ranga, MD  insulin aspart (NOVOLOG) 100 UNIT/ML injection Inject 3 Units into the skin 3 (three) times daily before meals. Patient taking differently: Inject 5 Units into the skin 3 (three) times daily with meals.  07/26/14   Belkys A Regalado, MD  insulin glargine (LANTUS) 100 UNIT/ML injection Inject 0.15 mLs (15 Units total) into the skin at  bedtime. Patient taking differently: Inject 15 Units into the skin at bedtime. At 10pm 07/26/14   Belkys A Regalado, MD  metFORMIN (GLUCOPHAGE) 500 MG tablet Take 500 mg by mouth 2 (two) times daily. 10/25/14   Historical Provider, MD  Multiple Vitamin (MULTIVITAMIN WITH MINERALS) TABS tablet Take 1 tablet by mouth daily.     Historical Provider, MD  NOVOLOG FLEXPEN 100 UNIT/ML FlexPen INJECT 2 UNITS INTO SKIN 3 TIMES DAILY BEFORE MEALS 01/13/15   Biagio Borg, MD  oxyCODONE (OXY IR/ROXICODONE) 5 MG immediate release tablet Take 1 tablet (5 mg total) by mouth every 6 (six) hours as needed for severe pain. 01/04/15   Charlynne Cousins, MD  sertraline (ZOLOFT) 100 MG tablet Take 150 mg by mouth daily.    Historical Provider, MD  spironolactone (ALDACTONE) 100 MG tablet Take 1 tablet (100 mg total) by mouth daily. 01/05/15   Charlynne Cousins, MD   BP 137/94 mmHg  Pulse 120  Temp(Src) 99 F (37.2 C) (Oral)  Resp 16  SpO2 91%  LMP 11/01/2011 Physical Exam  Constitutional: She is oriented to person, place, and time. She appears well-developed and well-nourished.  HENT:  Head: Normocephalic and atraumatic.  Cardiovascular: Normal rate and regular rhythm.   Pulmonary/Chest: Effort normal and breath sounds normal.  Abdominal: Soft. Bowel sounds are normal. There is tenderness.  Musculoskeletal: Normal range of motion.  Neurological: She is alert and oriented to person, place, and time.  Skin: Skin is warm and dry.  Psychiatric: She has a normal mood and affect.  Nursing note and vitals reviewed.   ED Course  Procedures (including critical care time) Labs Review Labs Reviewed  ETHANOL - Abnormal; Notable for the following:    Alcohol, Ethyl (B) 352 (*)    All other components within normal limits  AMMONIA - Abnormal; Notable for the following:    Ammonia 43 (*)    All other components within normal limits  BLOOD GAS, VENOUS - Abnormal; Notable for the following:    pH, Ven 7.372 (*)     pCO2, Ven 56.2 (*)    pO2, Ven 20.0 (*)    Bicarbonate 31.8 (*)    Acid-Base Excess 5.3 (*)    All other components within normal limits  CBC WITH DIFFERENTIAL/PLATELET - Abnormal; Notable for the following:    WBC 3.9 (*)    Hemoglobin 15.4 (*)    MCV 104.4 (*)    MCH 35.7 (*)    Platelets 90 (*)    All other components within normal limits  COMPREHENSIVE METABOLIC PANEL - Abnormal; Notable for the following:    Glucose, Bld 406 (*)    BUN  5 (*)    Total Protein 8.6 (*)    AST 134 (*)    ALT 97 (*)    Total Bilirubin 1.6 (*)    All other components within normal limits  CBG MONITORING, ED - Abnormal; Notable for the following:    Glucose-Capillary 444 (*)    All other components within normal limits  CBG MONITORING, ED - Abnormal; Notable for the following:    Glucose-Capillary 283 (*)    All other components within normal limits  LIPASE, BLOOD  CBC WITH DIFFERENTIAL/PLATELET  URINALYSIS, ROUTINE W REFLEX MICROSCOPIC (NOT AT [facility])    Imaging Review No results found. I have personally reviewed and evaluated these images and lab results as part of my medical decision-making.   EKG Interpretation None      MDM   Final diagnoses:  Abdominal pain, unspecified abdominal location  ETOH abuse  Hyperglycemia    Pt states that she feels a lot better but would like a dose of dialudid before she leaves. No sign of ascites today. Doubt acute process at this time. Heart rate and blood sugar are down. Think that pt is okay to go home and follow up with Dr. [name] as planned    Glendell Docker, NP 01/15/15 0093  Davonna Belling, MD 01/15/15 [phone]

## 2015-01-14 NOTE — ED Notes (Signed)
Nurse drawing labs. 

## 2015-01-14 NOTE — ED Notes (Signed)
Per Tasha in lab, pt ETOH is 352.  RN notified.

## 2015-01-14 NOTE — ED Notes (Addendum)
Pt transported by [facility] for abdominal pain today.  Family reported to EMS that patient has hx of ETOH abuse and has been drinking all day.   EMS reports pt has bruising to lower abdomen and had ?paracentesis on 9/14.  ? Dx of liver cancer - has follow up appt next week - per EMS this was found during last hospital visit.

## 2015-01-14 NOTE — ED Notes (Signed)
Informed the pt that a urine specimen is needed. 

## 2015-01-15 LAB — URINE MICROSCOPIC-ADD ON

## 2015-01-15 LAB — URINALYSIS, ROUTINE W REFLEX MICROSCOPIC
BILIRUBIN URINE: NEGATIVE
Glucose, UA: >1000 mg/dL — AB
KETONES UR: NEGATIVE mg/dL
NITRITE: NEGATIVE
PH: 5.5 (ref 5.0–8.0)
PROTEIN: NEGATIVE mg/dL
Specific Gravity, Urine: 1.028 (ref 1.005–1.030)
UROBILINOGEN UA: 1 mg/dL (ref 0.0–1.0)

## 2015-01-15 LAB — CBG MONITORING, ED: GLUCOSE-CAPILLARY: 283 mg/dL — AB (ref 65–99)

## 2015-01-15 MED ORDER — HYDROMORPHONE HCL 1 MG/ML IJ SOLN
1.0000 mg | Freq: Once | INTRAMUSCULAR | Status: AC
  Administered 2015-01-15: 1 mg via INTRAVENOUS
  Filled 2015-01-15: qty 1

## 2015-01-15 NOTE — Discharge Instructions (Signed)
You need to follow up with Dr. [name] as planned Abdominal Pain Many things can cause abdominal pain. Usually, abdominal pain is not caused by a disease and will improve without treatment. It can often be observed and treated at home. Your health care provider will do a physical exam and possibly order blood tests and X-rays to help determine the seriousness of your pain. However, in many cases, more time must pass before a clear cause of the pain can be found. Before that point, your health care provider may not know if you need more testing or further treatment. HOME CARE INSTRUCTIONS  Monitor your abdominal pain for any changes. The following actions may help to alleviate any discomfort you are experiencing:  Only take over-the-counter or prescription medicines as directed by your health care provider.  Do not take laxatives unless directed to do so by your health care provider.  Try a clear liquid diet (broth, tea, or water) as directed by your health care provider. Slowly move to a bland diet as tolerated. SEEK MEDICAL CARE IF:  You have unexplained abdominal pain.  You have abdominal pain associated with nausea or diarrhea.  You have pain when you urinate or have a bowel movement.  You experience abdominal pain that wakes you in the night.  You have abdominal pain that is worsened or improved by eating food.  You have abdominal pain that is worsened with eating fatty foods.  You have a fever. SEEK IMMEDIATE MEDICAL CARE IF:   Your pain does not go away within 2 hours.  You keep throwing up (vomiting).  Your pain is felt only in portions of the abdomen, such as the right side or the left lower portion of the abdomen.  You pass bloody or black tarry stools. MAKE SURE YOU:  Understand these instructions.   Will watch your condition.   Will get help right away if you are not doing well or get worse.  Document Released: 01/16/2005 Document Revised: 04/13/2013 Document  Reviewed: 12/16/2012 [facility] Patient Information 2015 [place], [place]. This information is not intended to replace advice given to you by your health care provider. Make sure you discuss any questions you have with your health care provider.

## 2015-01-15 NOTE — ED Notes (Signed)
PA at bedside.

## 2015-01-20 ENCOUNTER — Encounter: Payer: Self-pay | Admitting: Internal Medicine

## 2015-01-20 ENCOUNTER — Ambulatory Visit (INDEPENDENT_AMBULATORY_CARE_PROVIDER_SITE_OTHER): Payer: Medicaid Other | Admitting: Internal Medicine

## 2015-01-20 VITALS — BP 122/80 | HR 103 | Temp 98.9°F | Ht 62.0 in | Wt 109.0 lb

## 2015-01-20 DIAGNOSIS — Z23 Encounter for immunization: Secondary | ICD-10-CM

## 2015-01-20 DIAGNOSIS — F1029 Alcohol dependence with unspecified alcohol-induced disorder: Secondary | ICD-10-CM | POA: Diagnosis not present

## 2015-01-20 DIAGNOSIS — R932 Abnormal findings on diagnostic imaging of liver and biliary tract: Secondary | ICD-10-CM | POA: Diagnosis not present

## 2015-01-20 DIAGNOSIS — E119 Type 2 diabetes mellitus without complications: Secondary | ICD-10-CM

## 2015-01-20 MED ORDER — INSULIN GLARGINE 100 UNIT/ML ~~LOC~~ SOLN: 15.0000 [IU] | Freq: Every day | SUBCUTANEOUS | Status: DC

## 2015-01-20 MED ORDER — LACTULOSE 10 GM/15ML PO SOLN: 30.0000 g | Freq: Every day | ORAL | Status: DC

## 2015-01-20 MED ORDER — OXYCODONE HCL 5 MG PO TABS: ORAL_TABLET | ORAL | Status: DC

## 2015-01-20 NOTE — Progress Notes (Signed)
Subjective:    Patient ID: [name], female    DOB: 09/16/1965, 49 y.o.   MRN: [id number]  HPI     Here to f/u, hosp mid sept with acute ETOH intoxication, hx of polysubstance abuse, major recurrent depression, Hep C, cirrhosis with ascites and portal HTN and TCP/leukopenia, PCM, and abnormal CT with some ? Of nodule needing biopsy, also paracentesis done, aldactone increased, and K re-check at ED sept 24 normal.  Pt c/o pain to abd and back and seems to have some ascites recurrence, overall wt no change, but has been up and down with paracentesis. No overt bleeding.  Ammonia level mild elevated at 43, has been "goofy" at times at home per family with her Wt Readings from Last 3 Encounters:  01/20/15 109 lb (49.442 kg)  01/03/15 110 lb (49.896 kg)  11/07/14 118 lb 4.8 oz (53.661 kg)   Past Medical History  Diagnosis Date  . Impaired glucose tolerance 11/07/2010  . Hepatitis C approx dx 2005    no tx to date  . Pancreatitis     HX of  . Alcohol dependency     at least [place] admissions 2007- 09/2013 for detox.   Marland Kitchen GERD (gastroesophageal reflux disease)   . Chronic constipation   . Gallstones   . Drug dependency     Hx of cocaine use  . Anxiety and depression 08/2011  . Rheumatoid factor positive   . Hemochromatosis     last phlebotomy tx ~ 2012. liver bx 2006  . Pancytopenia   . Abnormal vaginal bleeding     uterine fibroid  . Gallstone 2006  . Chronic abdominal pain 2011  . Cirrhosis 11/09/2010  . Type II or unspecified type diabetes mellitus without mention of complication, uncontrolled 09/17/2011  . Major depression 11/2012, 09/2013    [facility] admissions for this, suicide attempt by OD Ambien (2014), Prozac (2015),ETOHism   . Anxiety    Past Surgical History  Procedure Laterality Date  . Appendectomy    . Cesarean section  x 2  . Foot surgery  2013  . Abdominal hysterectomy  12/17/2011    Procedure: HYSTERECTOMY ABDOMINAL;  Surgeon: Gus Height, MD;  Location: [facility];   Service: Gynecology;  Laterality: N/A;  . Salpingoophorectomy  12/17/2011    Procedure: SALPINGO OOPHERECTOMY;  Surgeon: Gus Height, MD;  Location: [facility];  Service: Gynecology;  Laterality: Bilateral;  . Esophagogastroduodenoscopy N/A 12/10/2013    Procedure: ESOPHAGOGASTRODUODENOSCOPY (EGD);  Surgeon: Ladene Artist, MD;  Location: [facility];  Service: Endoscopy;  Laterality: N/A;  . Percutaneous liver biopsy  2011  . Colonoscopy N/A 02/18/2014    Procedure: COLONOSCOPY;  Surgeon: Gatha Mayer, MD;  Location: [facility];  Service: Endoscopy;  Laterality: N/A;    reports that she has never smoked. She has never used smokeless tobacco. She reports that she drinks alcohol. She reports that she does not use illicit drugs. family history includes Cancer in her mother; Diabetes in her father. Allergies  Allergen Reactions  . Betadine [Povidone Iodine] Hives   Current Outpatient Prescriptions on File Prior to Visit  Medication Sig Dispense Refill  . diphenhydrAMINE (BENADRYL) 25 MG tablet Take 50 mg by mouth at bedtime as needed for sleep.     Marland Kitchen docusate sodium (COLACE) 100 MG capsule Take 1 capsule (100 mg total) by mouth 2 (two) times daily. 10 capsule 0  . esomeprazole (NEXIUM) 40 MG capsule Take 40 mg by mouth daily as needed (heartburn).    Marland Kitchen  furosemide (LASIX) 40 MG tablet Take 1 tablet (40 mg total) by mouth daily. 30 tablet 0  . insulin aspart (NOVOLOG) 100 UNIT/ML injection Inject 3 Units into the skin 3 (three) times daily before meals. (Patient taking differently: Inject 5 Units into the skin 3 (three) times daily with meals. ) 10 mL 11  . KLOR-CON M10 10 MEQ tablet Take 10 mEq by mouth daily.    . metFORMIN (GLUCOPHAGE) 500 MG tablet Take 500 mg by mouth 2 (two) times daily.    . Multiple Vitamin (MULTIVITAMIN WITH MINERALS) TABS tablet Take 1 tablet by mouth daily.     Marland Kitchen NOVOLOG FLEXPEN 100 UNIT/ML FlexPen INJECT 2 UNITS INTO SKIN 3 TIMES DAILY BEFORE MEALS 15 mL 2  .  sertraline (ZOLOFT) 100 MG tablet Take 150 mg by mouth daily.    Marland Kitchen spironolactone (ALDACTONE) 100 MG tablet Take 1 tablet (100 mg total) by mouth daily. 30 tablet 0   No current facility-administered medications on file prior to visit.   Review of Systems   Constitutional: Negative for unusual diaphoresis or night sweats HENT: Negative for ringing in ear or discharge Eyes: Negative for double vision or worsening visual disturbance.  Respiratory: Negative for choking and stridor.   Gastrointestinal: Negative for vomiting or other signifcant bowel change Genitourinary: Negative for hematuria or change in urine volume.  Musculoskeletal: Negative for other MSK pain or swelling Skin: Negative for color change and worsening wound.  Neurological: Negative for tremors and numbness other than noted  Psychiatric/Behavioral: Negative for decreased concentration or agitation other than above       Objective:   Physical Exam BP 122/80 mmHg  Pulse 103  Temp(Src) 98.9 F (37.2 C) (Oral)  Ht 5\' 2"  (1.575 m)  Wt 109 lb (49.442 kg)  BMI 19.93 kg/m2  SpO2 96%  LMP 11/01/2011 VS noted,  Constitutional: Pt appears in no significant distress HENT: Head: NCAT.  Right Ear: External ear normal.  Left Ear: External ear normal.  Eyes: . Pupils are equal, round, and reactive to light. Conjunctivae and EOM are normal Neck: Normal range of motion. Neck supple.  Cardiovascular: Normal rate and regular rhythm.   Pulmonary/Chest: Effort normal and breath sounds without rales or wheezing.  Abd:  Soft,  ND, + BS, diffuse mild tender without guarding or rebound Neurological: Pt is alert. Not confused , motor grossly intact Skin: Skin is warm. No rash, no LE edema, mult bruises to extremities noted, worse LUE Psychiatric: Pt behavior is normal. No agitation.     Assessment & Plan:

## 2015-01-20 NOTE — Assessment & Plan Note (Addendum)
With some nodule with ? Of need biopsy in the setting of cirrhosis, hep c hx, for GI referral, has seen Dr Carlean Purl in the past  Note:  Total time for pt hx, exam, review of record with pt in the room, determination of diagnoses and plan for further eval and tx is > 40 min, with over 50% spent in coordination and counseling of patient

## 2015-01-20 NOTE — Assessment & Plan Note (Signed)
Urged to quit,  to f/u any worsening symptoms or concerns  

## 2015-01-20 NOTE — Progress Notes (Signed)
Pre visit review using our clinic review tool, if applicable. No additional management support is needed unless otherwise documented below in the visit note. 

## 2015-01-20 NOTE — Patient Instructions (Addendum)
You had the Prevnar pneumonia shot today  Please take all new medication as prescribed - the once per day lactulose  Please continue all other medications as before, and refills have been done if requeste - the oxycodone  Please have the pharmacy call with any other refills you may need.  Please continue your efforts at being more active, low cholesterol diet, and weight control.  You are otherwise up to date with prevention measures today.  You will be contacted regarding the referral for: GI - Dr Carlean Purl, and Endocrinology  Please keep your appointments with your specialists as you may have planned  We can hold on further lab work at this time  Please return in 3 months, or sooner if needed

## 2015-01-20 NOTE — Assessment & Plan Note (Signed)
stable overall by history and exam, recent data reviewed with pt, and pt to continue medical treatment as before,  to f/u any worsening symptoms or concerns Lab Results  Component Value Date   HGBA1C 10.4* 01/03/2015   For endo referral for better control, lantus refill

## 2015-01-31 ENCOUNTER — Encounter: Payer: Self-pay | Admitting: Internal Medicine

## 2015-02-17 ENCOUNTER — Other Ambulatory Visit: Payer: Self-pay | Admitting: Internal Medicine

## 2015-03-02 ENCOUNTER — Emergency Department (HOSPITAL_COMMUNITY)
Admission: EM | Admit: 2015-03-02 | Discharge: 2015-03-02 | Disposition: A | Discharge status: Home / Self Care | Payer: Medicaid Other | Attending: Emergency Medicine | Admitting: Emergency Medicine

## 2015-03-02 ENCOUNTER — Emergency Department (HOSPITAL_COMMUNITY): Payer: Medicaid Other

## 2015-03-02 ENCOUNTER — Encounter (HOSPITAL_COMMUNITY): Payer: Self-pay | Admitting: Emergency Medicine

## 2015-03-02 DIAGNOSIS — R109 Unspecified abdominal pain: Secondary | ICD-10-CM | POA: Diagnosis present

## 2015-03-02 DIAGNOSIS — Z794 Long term (current) use of insulin: Secondary | ICD-10-CM | POA: Diagnosis not present

## 2015-03-02 DIAGNOSIS — F329 Major depressive disorder, single episode, unspecified: Secondary | ICD-10-CM | POA: Diagnosis not present

## 2015-03-02 DIAGNOSIS — F419 Anxiety disorder, unspecified: Secondary | ICD-10-CM | POA: Insufficient documentation

## 2015-03-02 DIAGNOSIS — Z8619 Personal history of other infectious and parasitic diseases: Secondary | ICD-10-CM | POA: Insufficient documentation

## 2015-03-02 DIAGNOSIS — R188 Other ascites: Secondary | ICD-10-CM | POA: Insufficient documentation

## 2015-03-02 DIAGNOSIS — K219 Gastro-esophageal reflux disease without esophagitis: Secondary | ICD-10-CM | POA: Diagnosis not present

## 2015-03-02 DIAGNOSIS — G8929 Other chronic pain: Secondary | ICD-10-CM | POA: Insufficient documentation

## 2015-03-02 DIAGNOSIS — Z79899 Other long term (current) drug therapy: Secondary | ICD-10-CM | POA: Insufficient documentation

## 2015-03-02 DIAGNOSIS — R1084 Generalized abdominal pain: Secondary | ICD-10-CM | POA: Insufficient documentation

## 2015-03-02 DIAGNOSIS — E119 Type 2 diabetes mellitus without complications: Secondary | ICD-10-CM | POA: Diagnosis not present

## 2015-03-02 LAB — CBC WITH DIFFERENTIAL/PLATELET
Basophils Absolute: 0 10*3/uL (ref 0.0–0.1)
Basophils Relative: 1 %
Eosinophils Absolute: 0 10*3/uL (ref 0.0–0.7)
Eosinophils Relative: 2 %
HCT: 42.7 % (ref 36.0–46.0)
Hemoglobin: 14.5 g/dL (ref 12.0–15.0)
Lymphocytes Relative: 13 %
Lymphs Abs: 0.4 10*3/uL — ABNORMAL LOW (ref 0.7–4.0)
MCH: 35.8 pg — ABNORMAL HIGH (ref 26.0–34.0)
MCHC: 34 g/dL (ref 30.0–36.0)
MCV: 105.4 fL — ABNORMAL HIGH (ref 78.0–100.0)
Monocytes Absolute: 0.4 10*3/uL (ref 0.1–1.0)
Monocytes Relative: 15 %
Neutro Abs: 1.9 10*3/uL (ref 1.7–7.7)
Neutrophils Relative %: 70 %
Platelets: 63 10*3/uL — ABNORMAL LOW (ref 150–400)
RBC: 4.05 MIL/uL (ref 3.87–5.11)
RDW: 14.4 % (ref 11.5–15.5)
WBC: 2.7 10*3/uL — ABNORMAL LOW (ref 4.0–10.5)

## 2015-03-02 LAB — COMPREHENSIVE METABOLIC PANEL
ALT: 78 U/L — ABNORMAL HIGH (ref 14–54)
AST: 102 U/L — ABNORMAL HIGH (ref 15–41)
Albumin: 2.8 g/dL — ABNORMAL LOW (ref 3.5–5.0)
Alkaline Phosphatase: 128 U/L — ABNORMAL HIGH (ref 38–126)
Anion gap: 14 (ref 5–15)
BUN: 6 mg/dL (ref 6–20)
CO2: 24 mmol/L (ref 22–32)
Calcium: 8.9 mg/dL (ref 8.9–10.3)
Chloride: 90 mmol/L — ABNORMAL LOW (ref 101–111)
Creatinine, Ser: 0.7 mg/dL (ref 0.44–1.00)
GFR calc Af Amer: >60 mL/min (ref >60)
GFR calc non Af Amer: >60 mL/min (ref >60)
Glucose, Bld: 546 mg/dL — ABNORMAL HIGH (ref 65–99)
Potassium: 3.5 mmol/L (ref 3.5–5.1)
Sodium: 128 mmol/L — ABNORMAL LOW (ref 135–145)
Total Bilirubin: 2.3 mg/dL — ABNORMAL HIGH (ref 0.3–1.2)
Total Protein: 7.3 g/dL (ref 6.5–8.1)

## 2015-03-02 LAB — URINALYSIS, ROUTINE W REFLEX MICROSCOPIC
Bilirubin Urine: NEGATIVE
Glucose, UA: >1000 mg/dL — AB
Hgb urine dipstick: NEGATIVE
Ketones, ur: 40 mg/dL — AB
Leukocytes, UA: NEGATIVE
Nitrite: NEGATIVE
Protein, ur: NEGATIVE mg/dL
Specific Gravity, Urine: 1.04 — ABNORMAL HIGH (ref 1.005–1.030)
Urobilinogen, UA: 1 mg/dL (ref 0.0–1.0)
pH: 7 (ref 5.0–8.0)

## 2015-03-02 LAB — URINE MICROSCOPIC-ADD ON

## 2015-03-02 LAB — GRAM STAIN

## 2015-03-02 LAB — PROTEIN, BODY FLUID: Total protein, fluid: <3 g/dL

## 2015-03-02 LAB — LIPASE, BLOOD: Lipase: 58 U/L — ABNORMAL HIGH (ref 11–51)

## 2015-03-02 LAB — BODY FLUID CELL COUNT WITH DIFFERENTIAL
Lymphs, Fluid: 5 %
Monocyte-Macrophage-Serous Fluid: 81 % (ref 50–90)
Neutrophil Count, Fluid: 14 % (ref 0–25)
Total Nucleated Cell Count, Fluid: 343 cu mm (ref 0–1000)

## 2015-03-02 LAB — GLUCOSE, PERITONEAL FLUID: Glucose, Peritoneal Fluid: 313 mg/dL

## 2015-03-02 LAB — ALBUMIN, FLUID (OTHER): Albumin, Fluid: <1 g/dL

## 2015-03-02 LAB — BETA-HYDROXYBUTYRIC ACID: Beta-Hydroxybutyric Acid: 2.67 mmol/L — ABNORMAL HIGH (ref 0.05–0.27)

## 2015-03-02 LAB — CBG MONITORING, ED: Glucose-Capillary: 213 mg/dL — ABNORMAL HIGH (ref 65–99)

## 2015-03-02 LAB — LACTATE DEHYDROGENASE, PLEURAL OR PERITONEAL FLUID: LD, Fluid: 30 U/L — ABNORMAL HIGH (ref 3–23)

## 2015-03-02 MED ORDER — HYDROMORPHONE HCL 1 MG/ML IJ SOLN
1.0000 mg | Freq: Once | INTRAMUSCULAR | Status: AC
  Administered 2015-03-02: 1 mg via INTRAVENOUS

## 2015-03-02 MED ORDER — SODIUM CHLORIDE 0.9 % IV BOLUS (SEPSIS)
500.0000 mL | Freq: Once | INTRAVENOUS | Status: AC
  Administered 2015-03-02: 500 mL via INTRAVENOUS

## 2015-03-02 MED ORDER — LIDOCAINE HCL (PF) 1 % IJ SOLN
INTRAMUSCULAR | Status: AC
  Filled 2015-03-02: qty 10

## 2015-03-02 MED ORDER — HYDROMORPHONE HCL 1 MG/ML PO LIQD: 1.0000 mg | Freq: Once | ORAL | Status: DC

## 2015-03-02 MED ORDER — OXYCODONE-ACETAMINOPHEN 5-325 MG PO TABS: 1.0000 | ORAL_TABLET | ORAL | Status: DC | PRN

## 2015-03-02 MED ORDER — HYDROMORPHONE HCL 1 MG/ML IJ SOLN
1.0000 mg | Freq: Once | INTRAMUSCULAR | Status: AC
  Administered 2015-03-02: 1 mg via INTRAVENOUS
  Filled 2015-03-02: qty 1

## 2015-03-02 MED ORDER — HYDROMORPHONE HCL 1 MG/ML IJ SOLN
1.0000 mg | Freq: Once | INTRAMUSCULAR | Status: AC
  Filled 2015-03-02: qty 1

## 2015-03-02 MED ORDER — SODIUM CHLORIDE 0.9 % IV BOLUS (SEPSIS)
1000.0000 mL | Freq: Once | INTRAVENOUS | Status: AC
Start: 2015-03-02 — End: 2015-03-02
  Administered 2015-03-02: 1000 mL via INTRAVENOUS

## 2015-03-02 MED ORDER — FUROSEMIDE 40 MG PO TABS: 40.0000 mg | ORAL_TABLET | Freq: Every day | ORAL | Status: DC

## 2015-03-02 MED ORDER — INSULIN ASPART 100 UNIT/ML ~~LOC~~ SOLN
10.0000 [IU] | Freq: Once | SUBCUTANEOUS | Status: AC
  Administered 2015-03-02: 10 [IU] via INTRAVENOUS
  Filled 2015-03-02 (×2): qty 1

## 2015-03-02 NOTE — ED Provider Notes (Signed)
Patient's care was assumed from [facility], PA-C it should change. Please see his note for further. This patient has a history of hepatitis C, cirrhosis and pancreatitis who presented to the emergency arm at generalized abdominal pain and swelling with ascites. Patient had IR guided paracentesis with great improvement. At should change patient is awaiting peritoneal fluid cell count and Gram stain. Plan is for discharge if these return without signs of infection. Patient is afebrile. Gram stain returned with few white blood cells with no organisms seen. At my evaluation the patient reports feeling much better after paracentesis. Her oxygen saturation is 96% on room air and her heart rate is 108. She has follow-up with Dr. [name]. I encouraged her to follow-up this week with Dr. [name]. Fluid cultures are pending. We'll discharge with a refill for her Lasix. I advised the patient to follow-up with their primary care provider this week. I advised the patient to return to the emergency department with new or worsening symptoms or new concerns. The patient verbalized understanding and agreement with plan.   Results for orders placed or performed during the hospital encounter of 03/02/15  Gram stain  Result Value Ref Range   Specimen Description FLUID ASCITIC    Special Requests NONE    Gram Stain      FEW WBC PRESENT, PREDOMINANTLY MONONUCLEAR NO ORGANISMS SEEN    Report Status 03/02/2015 FINAL   CBC with Differential  Result Value Ref Range   WBC 2.7 (L) 4.0 - 10.5 K/uL   RBC 4.05 3.87 - 5.11 MIL/uL   Hemoglobin 14.5 12.0 - 15.0 g/dL   HCT 42.7 36.0 - 46.0 %   MCV 105.4 (H) 78.0 - 100.0 fL   MCH 35.8 (H) 26.0 - 34.0 pg   MCHC 34.0 30.0 - 36.0 g/dL   RDW 14.4 11.5 - 15.5 %   Platelets 63 (L) 150 - 400 K/uL   Neutrophils Relative % 70 %   Neutro Abs 1.9 1.7 - 7.7 K/uL   Lymphocytes Relative 13 %   Lymphs Abs 0.4 (L) 0.7 - 4.0 K/uL   Monocytes Relative 15 %   Monocytes Absolute 0.4 0.1 - 1.0  K/uL   Eosinophils Relative 2 %   Eosinophils Absolute 0.0 0.0 - 0.7 K/uL   Basophils Relative 1 %   Basophils Absolute 0.0 0.0 - 0.1 K/uL  Comprehensive metabolic panel  Result Value Ref Range   Sodium 128 (L) 135 - 145 mmol/L   Potassium 3.5 3.5 - 5.1 mmol/L   Chloride 90 (L) 101 - 111 mmol/L   CO2 24 22 - 32 mmol/L   Glucose, Bld 546 (H) 65 - 99 mg/dL   BUN 6 6 - 20 mg/dL   Creatinine, Ser 0.70 0.44 - 1.00 mg/dL   Calcium 8.9 8.9 - 10.3 mg/dL   Total Protein 7.3 6.5 - 8.1 g/dL   Albumin 2.8 (L) 3.5 - 5.0 g/dL   AST 102 (H) 15 - 41 U/L   ALT 78 (H) 14 - 54 U/L   Alkaline Phosphatase 128 (H) 38 - 126 U/L   Total Bilirubin 2.3 (H) 0.3 - 1.2 mg/dL   GFR calc non Af Amer >60 >60 mL/min   GFR calc Af Amer >60 >60 mL/min   Anion gap 14 5 - 15  Lipase, blood  Result Value Ref Range   Lipase 58 (H) 11 - 51 U/L  Urinalysis, Routine w reflex microscopic (not at [facility])  Result Value Ref Range   Color, Urine  YELLOW YELLOW   APPearance CLEAR CLEAR   Specific Gravity, Urine 1.040 (H) 1.005 - 1.030   pH 7.0 5.0 - 8.0   Glucose, UA >1000 (A) NEGATIVE mg/dL   Hgb urine dipstick NEGATIVE NEGATIVE   Bilirubin Urine NEGATIVE NEGATIVE   Ketones, ur 40 (A) NEGATIVE mg/dL   Protein, ur NEGATIVE NEGATIVE mg/dL   Urobilinogen, UA 1.0 0.0 - 1.0 mg/dL   Nitrite NEGATIVE NEGATIVE   Leukocytes, UA NEGATIVE NEGATIVE  Urine microscopic-add on  Result Value Ref Range   Squamous Epithelial / LPF RARE RARE   WBC, UA 0-2 <3 WBC/hpf   RBC / HPF 0-2 <3 RBC/hpf   Bacteria, UA RARE RARE  Beta-hydroxybutyric acid  Result Value Ref Range   Beta-Hydroxybutyric Acid 2.67 (H) 0.05 - 0.27 mmol/L  Albumin, pleural or peritoneal fluid  Result Value Ref Range   Albumin, Fluid <1.0 g/dL   Fluid Type-FALB FLUID   Lactate dehydrogenase (CSF, pleural or peritoneal fluid)  Result Value Ref Range   LD, Fluid 30 (H) 3 - 23 U/L   Fluid Type-FLDH FLUID   Protein, pleural or peritoneal fluid  Result Value Ref  Range   Total protein, fluid <3.0 g/dL   Fluid Type-FTP FLUID   Body fluid cell count with differential  Result Value Ref Range   Fluid Type-FCT FLUID    Color, Fluid STRAW (A) YELLOW   Appearance, Fluid HAZY (A) CLEAR   WBC, Fluid 343 0 - 1000 cu mm   Neutrophil Count, Fluid 14 0 - 25 %   Lymphs, Fluid 5 %   Monocyte-Macrophage-Serous Fluid 81 50 - 90 %  Glucose, peritoneal fluid  Result Value Ref Range   Glucose, Peritoneal Fluid 313 mg/dL  POC CBG, ED  Result Value Ref Range   Glucose-Capillary 213 (H) 65 - 99 mg/dL   US Paracentesis  03/02/2015  INDICATION: Hepatitis-C, cirrhosis, recurrent ascites. Request is made for diagnostic and therapeutic paracentesis. EXAM: ULTRASOUND-GUIDED DIAGNOSTIC AND THERAPEUTIC PARACENTESIS COMPARISON:  Prior paracentesis on 01/04/2015 MEDICATIONS: None. COMPLICATIONS: None immediate TECHNIQUE: Informed written consent was obtained from the patient after a discussion of the risks, benefits and alternatives to treatment. A timeout was performed prior to the initiation of the procedure. Initial ultrasound scanning demonstrates a small to moderate amount of ascites within the right mid to lower abdominal quadrant. The right mid to lower abdomen was prepped and draped in the usual sterile fashion. 1% lidocaine was used for local anesthesia. Under direct ultrasound guidance, a 19 gauge, 7-cm, Yueh catheter was introduced. An ultrasound image was saved for documentation purposed.The paracentesis was performed. The catheter was removed and a dressing was applied. The patient tolerated the procedure well without immediate post procedural complication. FINDINGS: A total of approximately 2.2 liters of yellow fluid was removed. Samples were sent to the laboratory as requested by the clinical team. IMPRESSION: Successful ultrasound-guided diagnostic and therapeutic paracentesis yielding 2.2 liters of peritoneal fluid. Read by: Rowe Robert, PA-C Electronically Signed    By: Jacqulynn Cadet M.D.   On: 03/02/2015 15:50      Waynetta Pean, PA-C 03/02/15 1912  Merrily Pew, MD 03/03/15 [phone]

## 2015-03-02 NOTE — Procedures (Signed)
US guided diagnostic/therapeutic paracentesis performed yielding 2.2 liters yellow fluid. A portion of the fluid was sent to the lab for preordered studies. No immediate complications.

## 2015-03-02 NOTE — ED Notes (Signed)
Pt states her abd has been swelling since Sat. She has cirrhosis of the liver and usually has to have fluid drained from abd. Pt states it was drained a few months ago.

## 2015-03-02 NOTE — ED Notes (Signed)
Pt in IR 

## 2015-03-02 NOTE — Discharge Instructions (Signed)
Ascites Ascites is a collection of excess fluid in the abdomen. Ascites can range from mild to severe. It can get worse without treatment. CAUSES Possible causes include:  Cirrhosis. This is the most common cause of ascites.  Infection or inflammation in the abdomen.  Cancer in the abdomen.  Heart failure.  Kidney disease.  Inflammation of the pancreas.  Clots in the veins of the liver. SIGNS AND SYMPTOMS Signs and symptoms may include:  A feeling of fullness in your abdomen. This is common.  An increase in the size of your abdomen or your waist.  Swelling in your legs.  Swelling of the scrotum in men.  Difficulty breathing.  Abdominal pain.  Sudden weight gain. If the condition is mild, you may not have symptoms. DIAGNOSIS To make a diagnosis, your health care provider will:  Ask about your medical history.  Perform a physical exam.  Order imaging tests, such as an ultrasound or CT scan of your abdomen. TREATMENT Treatment depends on the cause of the ascites. It may include:  Taking a pill to make you urinate. This is called a water pill (diuretic pill).  Strictly reducing your salt (sodium) intake. Salt can cause extra fluid to be kept in the body, and this makes ascites worse.  Having a procedure to remove fluid from your abdomen (paracentesis).  Having a procedure to transfer fluid from your abdomen into a vein.  Having a procedure that connects two of the major veins within your liver and relieves pressure on your liver (TIPS procedure). Ascites may go away or improve with treatment of the condition that caused it.  HOME CARE INSTRUCTIONS  Keep track of your weight. To do this, weigh yourself at the same time every day and record your weight.  Keep track of how much you drink and any changes in the amount you urinate.  Follow any instructions that your health care provider gives you about how much to drink.  Try not to eat salty (high-sodium)  foods.  Take medicines only as directed by your health care provider.  Keep all follow-up visits as directed by your health care provider. This is important.  Report any changes in your health to your health care provider, especially if you develop new symptoms or your symptoms get worse. SEEK MEDICAL CARE IF:  Your gain more than 3 pounds in 3 days.  Your abdominal size or your waist size increases.  You have new swelling in your legs.  The swelling in your legs gets worse. SEEK IMMEDIATE MEDICAL CARE IF:  You develop a fever.  You develop confusion.  You develop new or worsening difficulty breathing.  You develop new or worsening abdominal pain.  You develop new or worsening swelling in the scrotum (in men).   This information is not intended to replace advice given to you by your health care provider. Make sure you discuss any questions you have with your health care provider.   Document Released: 04/08/2005 Document Revised: 04/29/2014 Document Reviewed: 11/05/2013 Elsevier Interactive Patient Education 2016 [employer].  Please read attached information. If you experience any new or worsening signs or symptoms please return to the emergency room for evaluation. Please follow-up with your primary care provider or specialist as discussed. Please use medication prescribed only as directed and discontinue taking if you have any concerning signs or symptoms.

## 2015-03-02 NOTE — ED Provider Notes (Signed)
CSN: [id number]     Arrival date & time 03/02/15  0901 History   First MD Initiated Contact with Patient 03/02/15 0940     Chief Complaint  Patient presents with  . Abdominal Pain   HPI   49 year old female with a history of hepatitis C, cirrhosis, pancreatitis, polysubstance abuse presents to the emergency room with abdominal pain and abdominal swelling over the last 7 days. She describes pain as constant, diffuse abdominal pain with radiation to the back, worse with palpation or movement of the abdomen. Patient denies associated nausea vomiting, fever, chills, changes in bowel or bladder characteristics or frequency. Patient reports previous episodes of the same requiring ultrasound-guided paracentesis. Patient reports that she to her blood sugar this morning he was elevated in the 300s, take her NovoLog as directed. She reports she uses NovoLog 3 times a day with Lantus at night.  Past Medical History  Diagnosis Date  . Impaired glucose tolerance 11/07/2010  . Hepatitis C approx dx 2005    no tx to date  . Pancreatitis     HX of  . Alcohol dependency ([place])     at least [place] admissions 2007- 09/2013 for detox.   Marland Kitchen GERD (gastroesophageal reflux disease)   . Chronic constipation   . Gallstones   . Drug dependency ([place])     Hx of cocaine use  . Anxiety and depression 08/2011  . Rheumatoid factor positive   . Hemochromatosis     last phlebotomy tx ~ 2012. liver bx 2006  . Pancytopenia   . Abnormal vaginal bleeding     uterine fibroid  . Gallstone 2006  . Chronic abdominal pain 2011  . Cirrhosis ([place]) 11/09/2010  . Type II or unspecified type diabetes mellitus without mention of complication, uncontrolled 09/17/2011  . Major depression ([place]) 11/2012, 09/2013    [facility] admissions for this, suicide attempt by OD Ambien (2014), Prozac (2015),ETOHism   . Anxiety    Past Surgical History  Procedure Laterality Date  . Appendectomy    . Cesarean section  x 2  . Foot surgery  2013  .  Abdominal hysterectomy  12/17/2011    Procedure: HYSTERECTOMY ABDOMINAL;  Surgeon: Gus Height, MD;  Location: [facility];  Service: Gynecology;  Laterality: N/A;  . Salpingoophorectomy  12/17/2011    Procedure: SALPINGO OOPHERECTOMY;  Surgeon: Gus Height, MD;  Location: [facility];  Service: Gynecology;  Laterality: Bilateral;  . Esophagogastroduodenoscopy N/A 12/10/2013    Procedure: ESOPHAGOGASTRODUODENOSCOPY (EGD);  Surgeon: Ladene Artist, MD;  Location: [facility];  Service: Endoscopy;  Laterality: N/A;  . Percutaneous liver biopsy  2011  . Colonoscopy N/A 02/18/2014    Procedure: COLONOSCOPY;  Surgeon: Gatha Mayer, MD;  Location: [facility];  Service: Endoscopy;  Laterality: N/A;   Family History  Problem Relation Age of Onset  . Cancer Mother     ovarian  . Diabetes Father    Social History  Substance Use Topics  . Smoking status: Never Smoker   . Smokeless tobacco: Never Used  . Alcohol Use: Yes     Comment: one pint vodka daily years   OB History    Gravida Para Term Preterm AB TAB SAB Ectopic Multiple Living   2 2 2       3      Review of Systems  All other systems reviewed and are negative.  Allergies  Betadine  Home Medications   Prior to Admission medications   Medication Sig Start Date End Date  Taking? Authorizing Provider  diphenhydrAMINE (BENADRYL) 25 MG tablet Take 50 mg by mouth at bedtime as needed for sleep.    Yes Historical Provider, MD  docusate sodium (COLACE) 100 MG capsule Take 1 capsule (100 mg total) by mouth 2 (two) times daily. 11/15/14  Yes Simbiso Ranga, MD  esomeprazole (NEXIUM) 40 MG capsule Take 40 mg by mouth daily as needed (heartburn).   Yes Historical Provider, MD  furosemide (LASIX) 40 MG tablet Take 1 tablet (40 mg total) by mouth daily. 11/15/14  Yes Simbiso Ranga, MD  insulin aspart (NOVOLOG) 100 UNIT/ML injection Inject 3 Units into the skin 3 (three) times daily before meals. Patient taking differently: Inject 5 Units into the skin 3  (three) times daily with meals.  07/26/14  Yes Belkys A Regalado, MD  insulin glargine (LANTUS) 100 UNIT/ML injection Inject 0.15 mLs (15 Units total) into the skin at bedtime. 01/20/15  Yes Biagio Borg, MD  KLOR-CON M10 10 MEQ tablet Take 10 mEq by mouth 3 (three) times a week.  11/15/14  Yes Historical Provider, MD  lactulose (CHRONULAC) 10 GM/15ML solution Take 45 mLs (30 g total) by mouth daily. Patient taking differently: Take 30 g by mouth once a week.  01/20/15  Yes Biagio Borg, MD  metFORMIN (GLUCOPHAGE) 500 MG tablet Take 500 mg by mouth 2 (two) times daily. 10/25/14  Yes Historical Provider, MD  Multiple Vitamin (MULTIVITAMIN WITH MINERALS) TABS tablet Take 1 tablet by mouth daily.    Yes Historical Provider, MD  NOVOLOG FLEXPEN 100 UNIT/ML FlexPen INJECT 2 UNITS INTO SKIN 3 TIMES DAILY BEFORE MEALS 01/13/15  Yes Biagio Borg, MD  oxyCODONE (OXY IR/ROXICODONE) 5 MG immediate release tablet 1/2 - 1 tab by mouth daily prn, to fill Jan 30, 2015 Patient taking differently: Take 5 mg by mouth every 4 (four) hours as needed for moderate pain.  01/20/15  Yes Biagio Borg, MD  sertraline (ZOLOFT) 100 MG tablet Take 150 mg by mouth daily.   Yes Historical Provider, MD  spironolactone (ALDACTONE) 100 MG tablet Take 1 tablet (100 mg total) by mouth daily. 01/05/15  Yes Charlynne Cousins, MD  oxyCODONE-acetaminophen (PERCOCET/ROXICET) 5-325 MG tablet Take 1 tablet by mouth every 4 (four) hours as needed for severe pain. 03/02/15   Okey Regal, PA-C  spironolactone (ALDACTONE) 50 MG tablet TAKE 1 TABLET BY MOUTH EVERY DAY Patient not taking: Reported on 03/02/2015 02/17/15   Biagio Borg, MD   BP 143/98 mmHg  Pulse 128  Temp(Src) 97.4 F (36.3 C) (Oral)  Resp 15  Ht 5\' 2"  (1.575 m)  Wt 110 lb (49.896 kg)  BMI 20.11 kg/m2  SpO2 95%  LMP 11/01/2011   Physical Exam  Constitutional: She is oriented to person, place, and time. She appears well-developed and well-nourished.  HENT:  Head:  Normocephalic and atraumatic.  Eyes: Conjunctivae are normal. Pupils are equal, round, and reactive to light. Right eye exhibits no discharge. Left eye exhibits no discharge. No scleral icterus.  Minimal jaundice  Neck: Normal range of motion. No JVD present. No tracheal deviation present.  Pulmonary/Chest: Effort normal. No stridor.  Abdominal: She exhibits distension. There is tenderness.  Distended abdomen, tympanic to percussion, bowel sounds normal  Musculoskeletal: She exhibits no edema or tenderness.  Neurological: She is alert and oriented to person, place, and time. Coordination normal.  Skin: Skin is warm and dry. No rash noted. No erythema. No pallor.  Bruising to the left lower quadrant, normal per  patient due to insulin injections  Psychiatric: She has a normal mood and affect. Her behavior is normal. Judgment and thought content normal.  Nursing note and vitals reviewed.   ED Course  Procedures (including critical care time) Labs Review Labs Reviewed  CBC WITH DIFFERENTIAL/PLATELET - Abnormal; Notable for the following:    WBC 2.7 (*)    MCV 105.4 (*)    MCH 35.8 (*)    Platelets 63 (*)    Lymphs Abs 0.4 (*)    All other components within normal limits  COMPREHENSIVE METABOLIC PANEL - Abnormal; Notable for the following:    Sodium 128 (*)    Chloride 90 (*)    Glucose, Bld 546 (*)    Albumin 2.8 (*)    AST 102 (*)    ALT 78 (*)    Alkaline Phosphatase 128 (*)    Total Bilirubin 2.3 (*)    All other components within normal limits  LIPASE, BLOOD - Abnormal; Notable for the following:    Lipase 58 (*)    All other components within normal limits  URINALYSIS, ROUTINE W REFLEX MICROSCOPIC (NOT AT [facility]) - Abnormal; Notable for the following:    Specific Gravity, Urine 1.040 (*)    Glucose, UA >1000 (*)    Ketones, ur 40 (*)    All other components within normal limits  BETA-HYDROXYBUTYRIC ACID - Abnormal; Notable for the following:    Beta-Hydroxybutyric Acid  2.67 (*)    All other components within normal limits  CBG MONITORING, ED - Abnormal; Notable for the following:    Glucose-Capillary 213 (*)    All other components within normal limits  BODY FLUID CULTURE  CULTURE, BODY FLUID-BOTTLE  GRAM STAIN  URINE MICROSCOPIC-ADD ON  ALBUMIN, FLUID  PROTEIN, BODY FLUID  GLUCOSE, PERITONEAL FLUID  LACTATE DEHYDROGENASE, BODY FLUID  GLUCOSE, PERITONEAL FLUID  PROTEIN, BODY FLUID  ALBUMIN, FLUID  LACTATE DEHYDROGENASE, BODY FLUID  BODY FLUID CELL COUNT WITH DIFFERENTIAL    Imaging Review No results found. I have personally reviewed and evaluated these images and lab results as part of my medical decision-making.   EKG Interpretation None      MDM   Final diagnoses:  Ascites  Generalized abdominal pain    Labs:   [facility] CBG, beta hydroxybutyrate acid, urinalysis, urine microscopic, CBC and CMP, lipase- original glucose 540 6 repeat 213 after insulin and fluids, platelets 63  Imaging:  Consults:  Therapeutics: Ultrasound-guided paracentesis, Dilaudid  Discharge Meds: Percocet  Assessment/Plan: This represents with abdominal pain and ascites. History of the same requiring ultrasound guided paracentesis. Patient also hyperglycemic. Patient's abdominal pain likely due to ascites, low suspicion for spontaneous bacterial peritonitis. Patient does have abdominal discomfort, but she is afebrile, no elevated white count, history of the same. She is nontoxic. Pain improved with ultrasound-guided centesis. Patient initially hyperglycemic, she had no anion gap, CO2 normal, CBG improved with 10 units of NovoLog. Due to patient's improvement in symptoms, low suspicion for bacterial infection or significant intra-abdominal pathology other than chronic well-known issues patient will be discharged home pending peritoneal fluid labs. Patient's care signed off to Will Dansie PA-C pending peritoneal labs. Patient is discharged to follow-up with her  primary care provider in the next 2 days for reevaluation, and management of her ongoing chronic issues. She is instructed to use her insulin as directed, return to the emergency room immediately if any new or worsening signs or symptoms present.        Dellis Filbert  Theta Leaf, PA-C 03/02/15 1635  Merrily Pew, MD 03/03/15 [phone]

## 2015-03-02 NOTE — ED Provider Notes (Signed)
Medical screening examination/treatment/procedure(s) were conducted as a shared visit with non-physician practitioner(s) and myself.  I personally evaluated the patient during the encounter.  49 year old female with cirrhosis here with ascites and significant abdominal discomfort. Is tachycardic and has tenderness to palpation. Minimal jaundice. Respiratory rate and blood pressure normal. Plan is to check labs and perform a paracentesis with ultrasound guidance in order to remove fluid to send to the lab and also will remove therapeutic amount as well.  Merrily Pew, MD 03/03/15 [phone]

## 2015-03-03 LAB — PATHOLOGIST SMEAR REVIEW

## 2015-03-07 LAB — CULTURE, BODY FLUID W GRAM STAIN -BOTTLE: Culture: NO GROWTH

## 2015-03-07 LAB — CULTURE, BODY FLUID-BOTTLE

## 2015-03-18 ENCOUNTER — Encounter (HOSPITAL_COMMUNITY): Payer: Self-pay | Admitting: Emergency Medicine

## 2015-03-18 ENCOUNTER — Emergency Department (HOSPITAL_COMMUNITY)
Admission: EM | Admit: 2015-03-18 | Discharge: 2015-03-18 | Payer: Medicaid Other | Attending: Emergency Medicine | Admitting: Emergency Medicine

## 2015-03-18 ENCOUNTER — Emergency Department (HOSPITAL_COMMUNITY): Payer: Medicaid Other

## 2015-03-18 DIAGNOSIS — F419 Anxiety disorder, unspecified: Secondary | ICD-10-CM | POA: Insufficient documentation

## 2015-03-18 DIAGNOSIS — W1839XA Other fall on same level, initial encounter: Secondary | ICD-10-CM | POA: Diagnosis not present

## 2015-03-18 DIAGNOSIS — Z8619 Personal history of other infectious and parasitic diseases: Secondary | ICD-10-CM | POA: Insufficient documentation

## 2015-03-18 DIAGNOSIS — Y998 Other external cause status: Secondary | ICD-10-CM | POA: Diagnosis not present

## 2015-03-18 DIAGNOSIS — R140 Abdominal distension (gaseous): Secondary | ICD-10-CM | POA: Diagnosis not present

## 2015-03-18 DIAGNOSIS — Z79899 Other long term (current) drug therapy: Secondary | ICD-10-CM | POA: Diagnosis not present

## 2015-03-18 DIAGNOSIS — E1165 Type 2 diabetes mellitus with hyperglycemia: Secondary | ICD-10-CM | POA: Diagnosis not present

## 2015-03-18 DIAGNOSIS — Y9389 Activity, other specified: Secondary | ICD-10-CM | POA: Insufficient documentation

## 2015-03-18 DIAGNOSIS — Z9049 Acquired absence of other specified parts of digestive tract: Secondary | ICD-10-CM | POA: Insufficient documentation

## 2015-03-18 DIAGNOSIS — Z794 Long term (current) use of insulin: Secondary | ICD-10-CM | POA: Insufficient documentation

## 2015-03-18 DIAGNOSIS — F329 Major depressive disorder, single episode, unspecified: Secondary | ICD-10-CM | POA: Diagnosis not present

## 2015-03-18 DIAGNOSIS — G8929 Other chronic pain: Secondary | ICD-10-CM | POA: Insufficient documentation

## 2015-03-18 DIAGNOSIS — S299XXA Unspecified injury of thorax, initial encounter: Secondary | ICD-10-CM | POA: Diagnosis present

## 2015-03-18 DIAGNOSIS — Y92149 Unspecified place in prison as the place of occurrence of the external cause: Secondary | ICD-10-CM | POA: Insufficient documentation

## 2015-03-18 DIAGNOSIS — K219 Gastro-esophageal reflux disease without esophagitis: Secondary | ICD-10-CM | POA: Diagnosis not present

## 2015-03-18 DIAGNOSIS — Z8742 Personal history of other diseases of the female genital tract: Secondary | ICD-10-CM | POA: Insufficient documentation

## 2015-03-18 DIAGNOSIS — R1084 Generalized abdominal pain: Secondary | ICD-10-CM | POA: Diagnosis not present

## 2015-03-18 DIAGNOSIS — Z90710 Acquired absence of both cervix and uterus: Secondary | ICD-10-CM | POA: Diagnosis not present

## 2015-03-18 DIAGNOSIS — R0781 Pleurodynia: Secondary | ICD-10-CM

## 2015-03-18 LAB — COMPREHENSIVE METABOLIC PANEL
ALK PHOS: 106 U/L (ref 38–126)
ALT: 55 U/L — AB (ref 14–54)
AST: 84 U/L — AB (ref 15–41)
Albumin: 2.8 g/dL — ABNORMAL LOW (ref 3.5–5.0)
Anion gap: 11 (ref 5–15)
BUN: 5 mg/dL — AB (ref 6–20)
CALCIUM: 8.2 mg/dL — AB (ref 8.9–10.3)
CHLORIDE: 89 mmol/L — AB (ref 101–111)
CO2: 34 mmol/L — AB (ref 22–32)
CREATININE: 0.47 mg/dL (ref 0.44–1.00)
Glucose, Bld: 387 mg/dL — ABNORMAL HIGH (ref 65–99)
Potassium: 2.6 mmol/L — CL (ref 3.5–5.1)
Sodium: 134 mmol/L — ABNORMAL LOW (ref 135–145)
Total Bilirubin: 1.3 mg/dL — ABNORMAL HIGH (ref 0.3–1.2)
Total Protein: 6.9 g/dL (ref 6.5–8.1)

## 2015-03-18 LAB — CBC WITH DIFFERENTIAL/PLATELET
BASOS ABS: 0 10*3/uL (ref 0.0–0.1)
Basophils Relative: 1 %
Eosinophils Absolute: 0.1 10*3/uL (ref 0.0–0.7)
Eosinophils Relative: 4 %
HCT: 36.5 % (ref 36.0–46.0)
HEMOGLOBIN: 12.7 g/dL (ref 12.0–15.0)
LYMPHS ABS: 1.1 10*3/uL (ref 0.7–4.0)
LYMPHS PCT: 31 %
MCH: 35.9 pg — AB (ref 26.0–34.0)
MCHC: 34.8 g/dL (ref 30.0–36.0)
MCV: 103.1 fL — AB (ref 78.0–100.0)
Monocytes Absolute: 0.5 10*3/uL (ref 0.1–1.0)
Monocytes Relative: 14 %
NEUTROS ABS: 1.7 10*3/uL (ref 1.7–7.7)
NEUTROS PCT: 51 %
PLATELETS: 93 10*3/uL — AB (ref 150–400)
RBC: 3.54 MIL/uL — AB (ref 3.87–5.11)
RDW: 13.9 % (ref 11.5–15.5)
WBC: 3.4 10*3/uL — AB (ref 4.0–10.5)

## 2015-03-18 LAB — CBG MONITORING, ED: GLUCOSE-CAPILLARY: 330 mg/dL — AB (ref 65–99)

## 2015-03-18 LAB — LIPASE, BLOOD: LIPASE: 65 U/L — AB (ref 11–51)

## 2015-03-18 MED ORDER — INSULIN ASPART PROT & ASPART (70-30 MIX) 100 UNIT/ML ~~LOC~~ SUSP
6.0000 [IU] | Freq: Once | SUBCUTANEOUS | Status: AC
  Administered 2015-03-18: 6 [IU] via SUBCUTANEOUS
  Filled 2015-03-18: qty 10

## 2015-03-18 MED ORDER — POTASSIUM CHLORIDE CRYS ER 20 MEQ PO TBCR
40.0000 meq | EXTENDED_RELEASE_TABLET | Freq: Once | ORAL | Status: AC
  Administered 2015-03-18: 40 meq via ORAL
  Filled 2015-03-18: qty 2

## 2015-03-18 MED ORDER — HYDROMORPHONE HCL 1 MG/ML IJ SOLN
1.0000 mg | Freq: Once | INTRAMUSCULAR | Status: AC
  Administered 2015-03-18: 1 mg via INTRAMUSCULAR
  Filled 2015-03-18: qty 1

## 2015-03-18 MED ORDER — HYDROMORPHONE HCL 1 MG/ML IJ SOLN: 1.0000 mg | Freq: Once | INTRAMUSCULAR | Status: DC

## 2015-03-18 MED ORDER — IBUPROFEN 400 MG PO TABS: 400.0000 mg | ORAL_TABLET | Freq: Four times a day (QID) | ORAL | Status: DC | PRN

## 2015-03-18 NOTE — ED Provider Notes (Signed)
CSN: [id number]     Arrival date & time 03/18/15  1753 History   First MD Initiated Contact with Patient 03/18/15 1838     Chief Complaint  Patient presents with  . fall/hyperglycemia      (Consider location/radiation/quality/duration/timing/severity/associated sxs/prior Treatment) HPI   Pt is a 49 yo female with PMH of hep C, pancreatitis, cirrhosis, DM and alcohol abuse who presents to the ED in custody of sheriff with complaint of right rib pain. Police report pt is arrested for assault due to throwing a plate at her husband. Police state that after the pt was arrested and in handcuffs she was leaning against a unlocked gate resulting in her falling and landing on her right ribs. Denies SOB, cough, CP, numbness, tingling, weakness. Police and pt deny any head injury or LOC. Pt endorses having pain in her right lower ribs, pain worse with breathing.  Pt also reports having worsening diffuse abdominal pain and fluid retention over the past week. She notes she was seen in the ED 2 weeks ago for ascites resulting in having the fluid drained. She notes she was feeling fine until she started having new onset of fluid retention over the past week. Denies fever, chills, N/V/D, urinary sxs.   Past Medical History  Diagnosis Date  . Impaired glucose tolerance 11/07/2010  . Hepatitis C approx dx 2005    no tx to date  . Pancreatitis     HX of  . Alcohol dependency ([place])     at least [facility] admissions 2007- 09/2013 for detox.   Marland Kitchen GERD (gastroesophageal reflux disease)   . Chronic constipation   . Gallstones   . Drug dependency ([place])     Hx of cocaine use  . Anxiety and depression 08/2011  . Rheumatoid factor positive   . Hemochromatosis     last phlebotomy tx ~ 2012. liver bx 2006  . Pancytopenia   . Abnormal vaginal bleeding     uterine fibroid  . Gallstone 2006  . Chronic abdominal pain 2011  . Cirrhosis ([place]) 11/09/2010  . Type II or unspecified type diabetes mellitus without mention of  complication, uncontrolled 09/17/2011  . Major depression ([place]) 11/2012, 09/2013    [facility] admissions for this, suicide attempt by OD Ambien (2014), Prozac (2015),ETOHism   . Anxiety    Past Surgical History  Procedure Laterality Date  . Appendectomy    . Cesarean section  x 2  . Foot surgery  2013  . Abdominal hysterectomy  12/17/2011    Procedure: HYSTERECTOMY ABDOMINAL;  Surgeon: Gus Height, MD;  Location: [facility];  Service: Gynecology;  Laterality: N/A;  . Salpingoophorectomy  12/17/2011    Procedure: SALPINGO OOPHERECTOMY;  Surgeon: Gus Height, MD;  Location: [facility];  Service: Gynecology;  Laterality: Bilateral;  . Esophagogastroduodenoscopy N/A 12/10/2013    Procedure: ESOPHAGOGASTRODUODENOSCOPY (EGD);  Surgeon: Ladene Artist, MD;  Location: [facility];  Service: Endoscopy;  Laterality: N/A;  . Percutaneous liver biopsy  2011  . Colonoscopy N/A 02/18/2014    Procedure: COLONOSCOPY;  Surgeon: Gatha Mayer, MD;  Location: [facility];  Service: Endoscopy;  Laterality: N/A;   Family History  Problem Relation Age of Onset  . Cancer Mother     ovarian  . Diabetes Father    Social History  Substance Use Topics  . Smoking status: Never Smoker   . Smokeless tobacco: Never Used  . Alcohol Use: Yes     Comment: one pint vodka daily years  OB History    Gravida Para Term Preterm AB TAB SAB Ectopic Multiple Living   2 2 2       3      Review of Systems  Cardiovascular: Positive for chest pain (right ribs).  Gastrointestinal: Positive for abdominal pain.  All other systems reviewed and are negative.     Allergies  Betadine  Home Medications   Prior to Admission medications   Medication Sig Start Date End Date Taking? Authorizing Provider  calcium carbonate (TUMS EX) 750 MG chewable tablet Chew 3 tablets by mouth 4 (four) times daily as needed for heartburn.   Yes Historical Provider, MD  diphenhydrAMINE (BENADRYL) 25 MG tablet Take 50 mg by mouth at bedtime as needed for  sleep.    Yes Historical Provider, MD  docusate sodium (COLACE) 100 MG capsule Take 1 capsule (100 mg total) by mouth 2 (two) times daily. 11/15/14  Yes Simbiso Ranga, MD  esomeprazole (NEXIUM) 40 MG capsule Take 40 mg by mouth daily as needed (heartburn).   Yes Historical Provider, MD  furosemide (LASIX) 40 MG tablet Take 1 tablet (40 mg total) by mouth daily. 03/02/15  Yes Waynetta Pean, PA-C  insulin aspart (NOVOLOG) 100 UNIT/ML injection Inject 3 Units into the skin 3 (three) times daily before meals. Patient taking differently: Inject 5 Units into the skin 3 (three) times daily with meals.  07/26/14  Yes Belkys A Regalado, MD  insulin glargine (LANTUS) 100 UNIT/ML injection Inject 0.15 mLs (15 Units total) into the skin at bedtime. 01/20/15  Yes Biagio Borg, MD  KLOR-CON M10 10 MEQ tablet Take 10 mEq by mouth daily.  11/15/14  Yes Historical Provider, MD  lactulose (CHRONULAC) 10 GM/15ML solution Take 45 mLs (30 g total) by mouth daily. 01/20/15  Yes Biagio Borg, MD  metFORMIN (GLUCOPHAGE) 500 MG tablet Take 500 mg by mouth 2 (two) times daily. 10/25/14  Yes Historical Provider, MD  Multiple Vitamin (MULTIVITAMIN WITH MINERALS) TABS tablet Take 1 tablet by mouth daily.    Yes Historical Provider, MD  NOVOLOG FLEXPEN 100 UNIT/ML FlexPen INJECT 2 UNITS INTO SKIN 3 TIMES DAILY BEFORE MEALS Patient taking differently: INJECT 5 UNITS INTO SKIN 3 TIMES DAILY BEFORE MEALS 01/13/15  Yes Biagio Borg, MD  oxyCODONE-acetaminophen (PERCOCET/ROXICET) 5-325 MG tablet Take 1 tablet by mouth every 4 (four) hours as needed for severe pain. 03/02/15  Yes Jeffrey Hedges, PA-C  sertraline (ZOLOFT) 100 MG tablet Take 150 mg by mouth daily.   Yes Historical Provider, MD  spironolactone (ALDACTONE) 100 MG tablet Take 1 tablet (100 mg total) by mouth daily. 01/05/15  Yes Charlynne Cousins, MD  spironolactone (ALDACTONE) 50 MG tablet TAKE 1 TABLET BY MOUTH EVERY DAY 02/17/15  Yes Biagio Borg, MD  ibuprofen (ADVIL,MOTRIN)  400 MG tablet Take 1 tablet (400 mg total) by mouth every 6 (six) hours as needed. 03/18/15   Nona Dell, PA-C  oxyCODONE (OXY IR/ROXICODONE) 5 MG immediate release tablet 1/2 - 1 tab by mouth daily prn, to fill Jan 30, 2015 Patient taking differently: Take 5 mg by mouth every 4 (four) hours as needed for moderate pain.  01/20/15   Biagio Borg, MD   BP 105/68 mmHg  Pulse 108  Temp(Src) 98.5 F (36.9 C) (Oral)  Resp 18  SpO2 93%  LMP 11/01/2011 Physical Exam  Constitutional: She is oriented to person, place, and time. She appears well-developed and well-nourished. No distress.  HENT:  Head: Normocephalic and atraumatic.  Mouth/Throat: Oropharynx is clear and moist. No oropharyngeal exudate.  Eyes: Conjunctivae and EOM are normal. Pupils are equal, round, and reactive to light. Right eye exhibits no discharge. Left eye exhibits no discharge. No scleral icterus.  Neck: Normal range of motion. Neck supple.  Cardiovascular: Normal rate, regular rhythm, normal heart sounds and intact distal pulses.   No murmur heard. Pulmonary/Chest: Effort normal and breath sounds normal. No respiratory distress. She has no wheezes. She has no rales. She exhibits tenderness (right lower lateral ribs).  Abdominal: Soft. Bowel sounds are normal. She exhibits distension. She exhibits no mass. There is tenderness. There is no rebound and no guarding.  Tympanic to percussion  Musculoskeletal: Normal range of motion. She exhibits no edema.  Lymphadenopathy:    She has no cervical adenopathy.  Neurological: She is alert and oriented to person, place, and time.  Skin: Skin is warm and dry. She is not diaphoretic.  Nursing note and vitals reviewed.   ED Course  Procedures (including critical care time) Labs Review Labs Reviewed  CBC WITH DIFFERENTIAL/PLATELET - Abnormal; Notable for the following:    WBC 3.4 (*)    RBC 3.54 (*)    MCV 103.1 (*)    MCH 35.9 (*)    Platelets 93 (*)    All other  components within normal limits  COMPREHENSIVE METABOLIC PANEL - Abnormal; Notable for the following:    Sodium 134 (*)    Potassium 2.6 (*)    Chloride 89 (*)    CO2 34 (*)    Glucose, Bld 387 (*)    BUN 5 (*)    Calcium 8.2 (*)    Albumin 2.8 (*)    AST 84 (*)    ALT 55 (*)    Total Bilirubin 1.3 (*)    All other components within normal limits  LIPASE, BLOOD - Abnormal; Notable for the following:    Lipase 65 (*)    All other components within normal limits  CBG MONITORING, ED - Abnormal; Notable for the following:    Glucose-Capillary 330 (*)    All other components within normal limits    Imaging Review Dg Ribs Unilateral W/chest Right  03/18/2015  CLINICAL DATA:  Right lower lateral rib pain after falling today EXAM: RIGHT RIBS AND CHEST - 3+ VIEW COMPARISON:  None. FINDINGS: Heart size is normal. Mild bibasilar atelectasis. No pneumothorax or effusion. Mild deformity posterior lateral right third rib likely due to prior trauma. No acute rib fracture identified. IMPRESSION: No acute findings Electronically Signed   By: Skipper Cliche M.D.   On: 03/18/2015 18:47   I have personally reviewed and evaluated these images and lab results as part of my medical decision-making.  Filed Vitals:   03/18/15 1815 03/18/15 2147  BP: 106/73 105/68  Pulse: 107 108  Temp: 98.3 F (36.8 C) 98.5 F (36.9 C)  Resp: 18 18     MDM   Final diagnoses:  Rib pain on right side    Patient presents in the custody of police from jail with complaint of right rib pain after falling, denies head injury or LOC. Patient also reports having worsening abdominal fluid retention and pain over the past week. Patient has a history of hep C, cirrhosis and ascites. She notes she was seen in the ED 2 weeks ago and had a paracentesis performed. Denies fever. VSS. Exam revealed right lower rib tender to palpation, distended abdomen, tympanic to percussion. Right rib x-ray negative. CBG 3:30, patient  given  insulin. Potassium 2.6, patient given potassium in the ED. Labs otherwise patient's baseline values. I do not suspect bacterial peritonitis at this time and feel that pt is appropriate to be d/c and to follow up with her PCP regarding her chronic ascites. Discussed results and plan for discharge with patient. Patient given prescription for ibuprofen and discharged under police custody.  Evaluation does not show pathology requring ongoing emergent intervention or admission. Pt is hemodynamically stable and mentating appropriately. Discussed findings/results and plan with patient/guardian, who agrees with plan. All questions answered. Return precautions discussed and outpatient follow up given.      Chesley Noon [place], [place] 03/19/15 0106  Malvin Johns, MD 03/19/15 1356

## 2015-03-18 NOTE — ED Notes (Signed)
Bed: [id number] Expected date:  Expected time:  Means of arrival:  Comments: 49 y/o F fall, etoh, hyperglycemia

## 2015-03-18 NOTE — Discharge Instructions (Signed)
Take your medications as prescribed as needed for pain relief. Continue taking your home medications as prescribed.  Please return to the Emergency Department if symptoms worsen or new onset of fever, vomiting, abdominal pain.

## 2015-03-18 NOTE — ED Notes (Addendum)
Per [facility] pt from jail , co fall 30 min ago co right ribs pain , also hyperglycemia 489 per EMS. Pt denies nausea nor emesis, no abd pain. Per EMS pt has ETOH on board. Pt alert and oriented x 4. VS 122/88, HR 100, O2 sat 96 RA. Sheriff at bedside

## 2015-03-20 LAB — CBG MONITORING, ED: GLUCOSE-CAPILLARY: 462 mg/dL — AB (ref 65–99)

## 2015-03-28 ENCOUNTER — Inpatient Hospital Stay (HOSPITAL_COMMUNITY)
Admission: EM | Admit: 2015-03-28 | Discharge: 2015-04-03 | DRG: 433 | Disposition: A | Discharge status: Home / Self Care | Payer: Medicaid Other | Attending: Internal Medicine | Admitting: Internal Medicine

## 2015-03-28 ENCOUNTER — Encounter (HOSPITAL_COMMUNITY): Payer: Self-pay | Admitting: Emergency Medicine

## 2015-03-28 DIAGNOSIS — R109 Unspecified abdominal pain: Secondary | ICD-10-CM

## 2015-03-28 DIAGNOSIS — Z8041 Family history of malignant neoplasm of ovary: Secondary | ICD-10-CM

## 2015-03-28 DIAGNOSIS — E1165 Type 2 diabetes mellitus with hyperglycemia: Secondary | ICD-10-CM | POA: Diagnosis present

## 2015-03-28 DIAGNOSIS — Z8719 Personal history of other diseases of the digestive system: Secondary | ICD-10-CM

## 2015-03-28 DIAGNOSIS — Z79899 Other long term (current) drug therapy: Secondary | ICD-10-CM

## 2015-03-28 DIAGNOSIS — R000 Tachycardia, unspecified: Secondary | ICD-10-CM | POA: Diagnosis present

## 2015-03-28 DIAGNOSIS — D696 Thrombocytopenia, unspecified: Secondary | ICD-10-CM | POA: Diagnosis present

## 2015-03-28 DIAGNOSIS — Y9229 Other specified public building as the place of occurrence of the external cause: Secondary | ICD-10-CM

## 2015-03-28 DIAGNOSIS — K5909 Other constipation: Secondary | ICD-10-CM | POA: Diagnosis present

## 2015-03-28 DIAGNOSIS — B1920 Unspecified viral hepatitis C without hepatic coma: Secondary | ICD-10-CM | POA: Diagnosis present

## 2015-03-28 DIAGNOSIS — Z90710 Acquired absence of both cervix and uterus: Secondary | ICD-10-CM

## 2015-03-28 DIAGNOSIS — Z9049 Acquired absence of other specified parts of digestive tract: Secondary | ICD-10-CM

## 2015-03-28 DIAGNOSIS — Z8711 Personal history of peptic ulcer disease: Secondary | ICD-10-CM

## 2015-03-28 DIAGNOSIS — K7031 Alcoholic cirrhosis of liver with ascites: Secondary | ICD-10-CM | POA: Diagnosis present

## 2015-03-28 DIAGNOSIS — F329 Major depressive disorder, single episode, unspecified: Secondary | ICD-10-CM | POA: Diagnosis present

## 2015-03-28 DIAGNOSIS — R188 Other ascites: Secondary | ICD-10-CM | POA: Diagnosis present

## 2015-03-28 DIAGNOSIS — S2231XA Fracture of one rib, right side, initial encounter for closed fracture: Secondary | ICD-10-CM | POA: Diagnosis present

## 2015-03-28 DIAGNOSIS — Z794 Long term (current) use of insulin: Secondary | ICD-10-CM

## 2015-03-28 DIAGNOSIS — Y906 Blood alcohol level of 120-199 mg/100 ml: Secondary | ICD-10-CM | POA: Diagnosis present

## 2015-03-28 DIAGNOSIS — Z833 Family history of diabetes mellitus: Secondary | ICD-10-CM

## 2015-03-28 DIAGNOSIS — R0902 Hypoxemia: Secondary | ICD-10-CM | POA: Diagnosis present

## 2015-03-28 DIAGNOSIS — F10239 Alcohol dependence with withdrawal, unspecified: Secondary | ICD-10-CM | POA: Diagnosis present

## 2015-03-28 DIAGNOSIS — R140 Abdominal distension (gaseous): Secondary | ICD-10-CM | POA: Diagnosis present

## 2015-03-28 DIAGNOSIS — K219 Gastro-esophageal reflux disease without esophagitis: Secondary | ICD-10-CM | POA: Diagnosis present

## 2015-03-28 DIAGNOSIS — R7989 Other specified abnormal findings of blood chemistry: Secondary | ICD-10-CM | POA: Diagnosis present

## 2015-03-28 DIAGNOSIS — K8020 Calculus of gallbladder without cholecystitis without obstruction: Secondary | ICD-10-CM | POA: Diagnosis present

## 2015-03-28 DIAGNOSIS — Z915 Personal history of self-harm: Secondary | ICD-10-CM

## 2015-03-28 DIAGNOSIS — D61818 Other pancytopenia: Secondary | ICD-10-CM | POA: Diagnosis present

## 2015-03-28 DIAGNOSIS — F419 Anxiety disorder, unspecified: Secondary | ICD-10-CM | POA: Diagnosis present

## 2015-03-28 DIAGNOSIS — W1830XA Fall on same level, unspecified, initial encounter: Secondary | ICD-10-CM | POA: Diagnosis present

## 2015-03-28 DIAGNOSIS — G8929 Other chronic pain: Secondary | ICD-10-CM | POA: Diagnosis present

## 2015-03-28 DIAGNOSIS — Z888 Allergy status to other drugs, medicaments and biological substances status: Secondary | ICD-10-CM

## 2015-03-28 DIAGNOSIS — R1084 Generalized abdominal pain: Secondary | ICD-10-CM | POA: Insufficient documentation

## 2015-03-28 MED ORDER — ONDANSETRON HCL 4 MG/2ML IJ SOLN
4.0000 mg | Freq: Once | INTRAMUSCULAR | Status: AC
  Administered 2015-03-29: 4 mg via INTRAVENOUS
  Filled 2015-03-28: qty 2

## 2015-03-28 MED ORDER — MORPHINE SULFATE (PF) 4 MG/ML IV SOLN
4.0000 mg | Freq: Once | INTRAVENOUS | Status: AC
Start: 2015-03-29 — End: 2015-03-29
  Administered 2015-03-29: 4 mg via INTRAVENOUS
  Filled 2015-03-28: qty 1

## 2015-03-28 MED ORDER — SODIUM CHLORIDE 0.9 % IV SOLN
INTRAVENOUS | Status: DC
  Administered 2015-03-29: via INTRAVENOUS

## 2015-03-28 NOTE — ED Notes (Signed)
Bed: WA02 Expected date: 03/28/15 Expected time: 10:21 PM Means of arrival: Ambulance Comments: 49 yo F  abd pain  Hx of pancreatitis

## 2015-03-28 NOTE — ED Provider Notes (Signed)
By signing my name below, I, Helane Gunther, attest that this documentation has been prepared under the direction and in the presence of [employer], DO. Electronically Signed: Helane Gunther, ED Scribe. 03/29/2015. 12:46 AM.  TIME SEEN: 11:43 PM  CHIEF COMPLAINT: Abdominal Pain  HPI: [name] is a 49 y.o. female with a PMHx of cirrhosis,  Hepatitis C, and DM, as well as drug (cocaine) and alcohol dependency who presents to the Emergency Department complaining of worsening, generalized, aching, constant abdominal pain onset a few weeks ago. She reports associated abdominal distention, n/v, and tarry stool (onset 1 week ago, last observed 4 days ago). She notes the last time fluid was drawn off was on 11/10 and states she usually waits to come in for this procedure until she can no longer bear the pain. She does not have paracentesis regularly scheduled. Prior to this. Centesis her last was in September. She denies taking iron and states her last use of Pepto Bismol was 2 weeks ago. She notes her last alcoholic drink was today. She reports a PSHx of C-section. She notes she does not use oxygen at home, but was given 2 L/min [place] in the ED due to an SpO2 of 88%. She denies taking blood thinners. No history of PE or DVT. Pt denies diarrhea.   ROS: See HPI Constitutional: no fever  Eyes: no drainage  ENT: no runny nose   Cardiovascular:  no chest pain  Resp: no SOB  GI: nausea, vomiting, tarry stool, abdominal distension GU: no dysuria Integumentary: no rash  Allergy: no hives  Musculoskeletal: no leg swelling  Neurological: no slurred speech ROS otherwise negative  PAST MEDICAL HISTORY/PAST SURGICAL HISTORY:  Past Medical History  Diagnosis Date  . Impaired glucose tolerance 11/07/2010  . Hepatitis C approx dx 2005    no tx to date  . Pancreatitis     HX of  . Alcohol dependency ([place])     at least [facility] admissions 2007- 09/2013 for detox.   Marland Kitchen GERD (gastroesophageal reflux disease)    . Chronic constipation   . Gallstones   . Drug dependency ([place])     Hx of cocaine use  . Anxiety and depression 08/2011  . Rheumatoid factor positive   . Hemochromatosis     last phlebotomy tx ~ 2012. liver bx 2006  . Pancytopenia   . Abnormal vaginal bleeding     uterine fibroid  . Gallstone 2006  . Chronic abdominal pain 2011  . Cirrhosis ([place]) 11/09/2010  . Type II or unspecified type diabetes mellitus without mention of complication, uncontrolled 09/17/2011  . Major depression ([place]) 11/2012, 09/2013    [facility] admissions for this, suicide attempt by OD Ambien (2014), Prozac (2015),ETOHism   . Anxiety     MEDICATIONS:  Prior to Admission medications   Medication Sig Start Date End Date Taking? Authorizing Provider  calcium carbonate (TUMS EX) 750 MG chewable tablet Chew 3 tablets by mouth 4 (four) times daily as needed for heartburn.   Yes Historical Provider, MD  diphenhydrAMINE (BENADRYL) 25 MG tablet Take 50 mg by mouth at bedtime as needed for sleep.    Yes Historical Provider, MD  docusate sodium (COLACE) 100 MG capsule Take 1 capsule (100 mg total) by mouth 2 (two) times daily. 11/15/14  Yes Simbiso Ranga, MD  esomeprazole (NEXIUM) 40 MG capsule Take 40 mg by mouth daily as needed (heartburn).   Yes Historical Provider, MD  furosemide (LASIX) 40 MG tablet Take 1  tablet (40 mg total) by mouth daily. 03/02/15  Yes Waynetta Pean, PA-C  ibuprofen (ADVIL,MOTRIN) 400 MG tablet Take 1 tablet (400 mg total) by mouth every 6 (six) hours as needed. 03/18/15  Yes Nona Dell, PA-C  insulin aspart (NOVOLOG) 100 UNIT/ML injection Inject 3 Units into the skin 3 (three) times daily before meals. Patient taking differently: Inject 5 Units into the skin 3 (three) times daily with meals.  07/26/14  Yes Belkys A Regalado, MD  insulin glargine (LANTUS) 100 UNIT/ML injection Inject 0.15 mLs (15 Units total) into the skin at bedtime. 01/20/15  Yes Biagio Borg, MD  KLOR-CON M10 10 MEQ tablet  Take 10 mEq by mouth daily.  11/15/14  Yes Historical Provider, MD  metFORMIN (GLUCOPHAGE) 500 MG tablet Take 500 mg by mouth 2 (two) times daily. 10/25/14  Yes Historical Provider, MD  Multiple Vitamin (MULTIVITAMIN WITH MINERALS) TABS tablet Take 1 tablet by mouth daily.    Yes Historical Provider, MD  NOVOLOG FLEXPEN 100 UNIT/ML FlexPen INJECT 2 UNITS INTO SKIN 3 TIMES DAILY BEFORE MEALS Patient taking differently: INJECT 5 UNITS INTO SKIN 3 TIMES DAILY BEFORE MEALS 01/13/15  Yes Biagio Borg, MD  sertraline (ZOLOFT) 100 MG tablet Take 150 mg by mouth daily.   Yes Historical Provider, MD  spironolactone (ALDACTONE) 50 MG tablet TAKE 1 TABLET BY MOUTH EVERY DAY 02/17/15  Yes Biagio Borg, MD  lactulose (CHRONULAC) 10 GM/15ML solution Take 45 mLs (30 g total) by mouth daily. Patient not taking: Reported on 03/28/2015 01/20/15   Biagio Borg, MD  oxyCODONE (OXY IR/ROXICODONE) 5 MG immediate release tablet 1/2 - 1 tab by mouth daily prn, to fill Jan 30, 2015 Patient not taking: Reported on 03/28/2015 01/20/15   Biagio Borg, MD  oxyCODONE-acetaminophen (PERCOCET/ROXICET) 5-325 MG tablet Take 1 tablet by mouth every 4 (four) hours as needed for severe pain. Patient not taking: Reported on 03/28/2015 03/02/15   Okey Regal, PA-C  spironolactone (ALDACTONE) 100 MG tablet Take 1 tablet (100 mg total) by mouth daily. Patient not taking: Reported on 03/28/2015 01/05/15   Charlynne Cousins, MD    ALLERGIES:  Allergies  Allergen Reactions  . Betadine [Povidone Iodine] Hives    SOCIAL HISTORY:  Social History  Substance Use Topics  . Smoking status: Never Smoker   . Smokeless tobacco: Never Used  . Alcohol Use: Yes     Comment: one pint vodka daily years    FAMILY HISTORY: Family History  Problem Relation Age of Onset  . Cancer Mother     ovarian  . Diabetes Father     EXAM: BP 121/93 mmHg  Pulse 125  Temp(Src) 98.5 F (36.9 C) (Oral)  Resp 19  SpO2 98%  LMP 11/01/2011 CONSTITUTIONAL:  Alert and oriented and responds appropriately to questions. Thin, chronically ill-appearing. HEAD: Normocephalic EYES: Conjunctivae clear, PERRL ENT: normal nose; no rhinorrhea; moist mucous membranes; pharynx without lesions noted NECK: Supple, no meningismus, no LAD  CARD: Regular tachycardic; S1 and S2 appreciated; no murmurs, no clicks, no rubs, no gallops RESP: Normal chest excursion without splinting or tachypnea; breath sounds clear and equal bilaterally; no wheezes, no rhonchi, no rales, no hypoxia or respiratory distress, speaking full sentences ABD/GI: Normal bowel sounds; distended; soft, diffuse TTP, no rebound, no guarding, no peritoneal signs, positive fluid wave RECTAL: Rectal Temp 99, no blood, normal rectal tone, 1 non-thrombose external hemmorrhoid, no melena, no active bleeding, no stool in the rectal vault, female chaperone present  BACK:  The back appears normal and is non-tender to palpation, there is no CVA tenderness EXT: Normal ROM in all joints; non-tender to palpation; no edema; normal capillary refill; no cyanosis, no calf tenderness or swelling    SKIN: Normal color for age and race; warm NEURO: Moves all extremities equally, sensation to light touch intact diffusely, cranial nerves II through XII intact PSYCH: The patient's mood and manner are appropriate. Grooming and personal hygiene are appropriate.  MEDICAL DECISION MAKING: Patient here with abdominal distention, abdominal pain. She is tachycardic and appears dehydrated on exam. She is afebrile. I doubt that she has SBP. I feel she will need a paracentesis and I feel this is likely what is causing her to be hypoxic. Pulmonary embolus is on the differential however. Will check labs, chest x-ray. We'll give pain medication. I feel she will need therapeutic paracentesis.  ED PROGRESS: Patient's labs appear to be at their baseline. Alcohol is 153. Chest x-ray shows positive basilar opacities that are likely atelectasis.  Discuss with hospitalist Dr. Hal Hope for admission and paracentesis. Discussed with him that I feel her vital signs should be reassessed when she has had a therapeutic paracentesis as I feel this is the cause of her hypoxia and tachycardia. Will admit to telemetry bed.   EKG Interpretation  Date/Time:  Wednesday March 29 2015 00:02:10 EST Ventricular Rate:  123 PR Interval:  136 QRS Duration: 82 QT Interval:  330 QTC Calculation: 472 R Axis:   54 Text Interpretation:  Sinus tachycardia Borderline T abnormalities, diffuse leads No significant change since last tracing other than rate is faster Confirmed by Allayna Erlich,  DO, Rigby Swamy [id number]) on 03/29/2015 3:03:51 AM       I personally performed the services described in this documentation, which was scribed in my presence. The recorded information has been reviewed and is accurate.   [place], DO 03/29/15 [phone]

## 2015-03-28 NOTE — ED Notes (Signed)
Patient presents for abdominal pain. Hx of cirr  Room air 88%, placed on 2L. Reports right rib pain.  Last VS: 121/93, 100% 2L, 16resp, 128hr.

## 2015-03-29 ENCOUNTER — Observation Stay (HOSPITAL_COMMUNITY): Payer: Medicaid Other

## 2015-03-29 ENCOUNTER — Emergency Department (HOSPITAL_COMMUNITY): Payer: Medicaid Other

## 2015-03-29 ENCOUNTER — Encounter (HOSPITAL_COMMUNITY): Payer: Self-pay | Admitting: Internal Medicine

## 2015-03-29 DIAGNOSIS — D696 Thrombocytopenia, unspecified: Secondary | ICD-10-CM | POA: Diagnosis not present

## 2015-03-29 DIAGNOSIS — Z9049 Acquired absence of other specified parts of digestive tract: Secondary | ICD-10-CM | POA: Diagnosis not present

## 2015-03-29 DIAGNOSIS — Y9229 Other specified public building as the place of occurrence of the external cause: Secondary | ICD-10-CM | POA: Diagnosis not present

## 2015-03-29 DIAGNOSIS — Z8719 Personal history of other diseases of the digestive system: Secondary | ICD-10-CM | POA: Diagnosis not present

## 2015-03-29 DIAGNOSIS — W1830XA Fall on same level, unspecified, initial encounter: Secondary | ICD-10-CM | POA: Diagnosis present

## 2015-03-29 DIAGNOSIS — Z90710 Acquired absence of both cervix and uterus: Secondary | ICD-10-CM | POA: Diagnosis not present

## 2015-03-29 DIAGNOSIS — F419 Anxiety disorder, unspecified: Secondary | ICD-10-CM | POA: Diagnosis present

## 2015-03-29 DIAGNOSIS — S2231XA Fracture of one rib, right side, initial encounter for closed fracture: Secondary | ICD-10-CM | POA: Diagnosis present

## 2015-03-29 DIAGNOSIS — Z8041 Family history of malignant neoplasm of ovary: Secondary | ICD-10-CM | POA: Diagnosis not present

## 2015-03-29 DIAGNOSIS — G8929 Other chronic pain: Secondary | ICD-10-CM | POA: Diagnosis present

## 2015-03-29 DIAGNOSIS — E1165 Type 2 diabetes mellitus with hyperglycemia: Secondary | ICD-10-CM | POA: Diagnosis not present

## 2015-03-29 DIAGNOSIS — K7031 Alcoholic cirrhosis of liver with ascites: Secondary | ICD-10-CM | POA: Diagnosis present

## 2015-03-29 DIAGNOSIS — Z8711 Personal history of peptic ulcer disease: Secondary | ICD-10-CM | POA: Diagnosis not present

## 2015-03-29 DIAGNOSIS — Z833 Family history of diabetes mellitus: Secondary | ICD-10-CM | POA: Diagnosis not present

## 2015-03-29 DIAGNOSIS — Z794 Long term (current) use of insulin: Secondary | ICD-10-CM | POA: Diagnosis not present

## 2015-03-29 DIAGNOSIS — D61818 Other pancytopenia: Secondary | ICD-10-CM | POA: Diagnosis present

## 2015-03-29 DIAGNOSIS — R109 Unspecified abdominal pain: Secondary | ICD-10-CM | POA: Diagnosis present

## 2015-03-29 DIAGNOSIS — K8020 Calculus of gallbladder without cholecystitis without obstruction: Secondary | ICD-10-CM | POA: Diagnosis present

## 2015-03-29 DIAGNOSIS — R188 Other ascites: Secondary | ICD-10-CM | POA: Diagnosis not present

## 2015-03-29 DIAGNOSIS — R140 Abdominal distension (gaseous): Secondary | ICD-10-CM | POA: Diagnosis present

## 2015-03-29 DIAGNOSIS — R7989 Other specified abnormal findings of blood chemistry: Secondary | ICD-10-CM | POA: Diagnosis present

## 2015-03-29 DIAGNOSIS — Z888 Allergy status to other drugs, medicaments and biological substances status: Secondary | ICD-10-CM | POA: Diagnosis not present

## 2015-03-29 DIAGNOSIS — R1084 Generalized abdominal pain: Secondary | ICD-10-CM | POA: Diagnosis not present

## 2015-03-29 DIAGNOSIS — R0902 Hypoxemia: Secondary | ICD-10-CM | POA: Diagnosis present

## 2015-03-29 DIAGNOSIS — F329 Major depressive disorder, single episode, unspecified: Secondary | ICD-10-CM | POA: Diagnosis present

## 2015-03-29 DIAGNOSIS — R000 Tachycardia, unspecified: Secondary | ICD-10-CM | POA: Diagnosis present

## 2015-03-29 DIAGNOSIS — K5909 Other constipation: Secondary | ICD-10-CM | POA: Diagnosis present

## 2015-03-29 DIAGNOSIS — Y906 Blood alcohol level of 120-199 mg/100 ml: Secondary | ICD-10-CM | POA: Diagnosis present

## 2015-03-29 DIAGNOSIS — K219 Gastro-esophageal reflux disease without esophagitis: Secondary | ICD-10-CM | POA: Diagnosis present

## 2015-03-29 DIAGNOSIS — F10239 Alcohol dependence with withdrawal, unspecified: Secondary | ICD-10-CM | POA: Diagnosis present

## 2015-03-29 DIAGNOSIS — B1920 Unspecified viral hepatitis C without hepatic coma: Secondary | ICD-10-CM | POA: Diagnosis present

## 2015-03-29 DIAGNOSIS — Z79899 Other long term (current) drug therapy: Secondary | ICD-10-CM | POA: Diagnosis not present

## 2015-03-29 DIAGNOSIS — Z915 Personal history of self-harm: Secondary | ICD-10-CM | POA: Diagnosis not present

## 2015-03-29 LAB — CBC WITH DIFFERENTIAL/PLATELET
BASOS ABS: 0 10*3/uL (ref 0.0–0.1)
Basophils Absolute: 0 10*3/uL (ref 0.0–0.1)
Basophils Relative: 0 %
Basophils Relative: 0 %
EOS PCT: 3 %
Eosinophils Absolute: 0.1 10*3/uL (ref 0.0–0.7)
Eosinophils Absolute: 0.1 10*3/uL (ref 0.0–0.7)
Eosinophils Relative: 3 %
HCT: 41.9 % (ref 36.0–46.0)
HEMATOCRIT: 39.2 % (ref 36.0–46.0)
Hemoglobin: 12.9 g/dL (ref 12.0–15.0)
Hemoglobin: 14.6 g/dL (ref 12.0–15.0)
LYMPHS ABS: 0.8 10*3/uL (ref 0.7–4.0)
LYMPHS ABS: 0.7 10*3/uL (ref 0.7–4.0)
LYMPHS PCT: 24 %
Lymphocytes Relative: 22 %
MCH: 35.2 pg — AB (ref 26.0–34.0)
MCH: 36.3 pg — ABNORMAL HIGH (ref 26.0–34.0)
MCHC: 32.9 g/dL (ref 30.0–36.0)
MCHC: 34.8 g/dL (ref 30.0–36.0)
MCV: 107.1 fL — AB (ref 78.0–100.0)
MCV: 104.2 fL — AB (ref 78.0–100.0)
MONO ABS: 0.4 10*3/uL (ref 0.1–1.0)
MONO ABS: 0.3 10*3/uL (ref 0.1–1.0)
Monocytes Relative: 10 %
Monocytes Relative: 13 %
NEUTROS ABS: 2 10*3/uL (ref 1.7–7.7)
Neutro Abs: 2 10*3/uL (ref 1.7–7.7)
Neutrophils Relative %: 60 %
Neutrophils Relative %: 65 %
PLATELETS: 88 10*3/uL — AB (ref 150–400)
Platelets: 72 10*3/uL — ABNORMAL LOW (ref 150–400)
RBC: 3.66 MIL/uL — AB (ref 3.87–5.11)
RBC: 4.02 MIL/uL (ref 3.87–5.11)
RDW: 14.1 % (ref 11.5–15.5)
RDW: 13.7 % (ref 11.5–15.5)
WBC: 3.3 10*3/uL — AB (ref 4.0–10.5)
WBC: 3.1 10*3/uL — AB (ref 4.0–10.5)

## 2015-03-29 LAB — COMPREHENSIVE METABOLIC PANEL
ALT: 62 U/L — AB (ref 14–54)
ALT: 71 U/L — ABNORMAL HIGH (ref 14–54)
ANION GAP: 10 (ref 5–15)
ANION GAP: 12 (ref 5–15)
AST: 93 U/L — AB (ref 15–41)
AST: 112 U/L — AB (ref 15–41)
Albumin: 2.6 g/dL — ABNORMAL LOW (ref 3.5–5.0)
Albumin: 2.9 g/dL — ABNORMAL LOW (ref 3.5–5.0)
Alkaline Phosphatase: 151 U/L — ABNORMAL HIGH (ref 38–126)
Alkaline Phosphatase: 167 U/L — ABNORMAL HIGH (ref 38–126)
BILIRUBIN TOTAL: 0.9 mg/dL (ref 0.3–1.2)
BUN: 7 mg/dL (ref 6–20)
BUN: 6 mg/dL (ref 6–20)
CHLORIDE: 100 mmol/L — AB (ref 101–111)
CHLORIDE: 101 mmol/L (ref 101–111)
CO2: 24 mmol/L (ref 22–32)
CO2: 26 mmol/L (ref 22–32)
Calcium: 8.3 mg/dL — ABNORMAL LOW (ref 8.9–10.3)
Calcium: 9.1 mg/dL (ref 8.9–10.3)
Creatinine, Ser: 0.37 mg/dL — ABNORMAL LOW (ref 0.44–1.00)
Creatinine, Ser: 0.4 mg/dL — ABNORMAL LOW (ref 0.44–1.00)
GFR calc Af Amer: >60 mL/min (ref >60)
GFR calc non Af Amer: >60 mL/min (ref >60)
GLUCOSE: 336 mg/dL — AB (ref 65–99)
Glucose, Bld: 351 mg/dL — ABNORMAL HIGH (ref 65–99)
POTASSIUM: 3.5 mmol/L (ref 3.5–5.1)
POTASSIUM: 3.7 mmol/L (ref 3.5–5.1)
SODIUM: 134 mmol/L — AB (ref 135–145)
Sodium: 139 mmol/L (ref 135–145)
TOTAL PROTEIN: 6.6 g/dL (ref 6.5–8.1)
Total Bilirubin: 1 mg/dL (ref 0.3–1.2)
Total Protein: 7.6 g/dL (ref 6.5–8.1)

## 2015-03-29 LAB — URINALYSIS, ROUTINE W REFLEX MICROSCOPIC
Bilirubin Urine: NEGATIVE
Glucose, UA: >1000 mg/dL — AB
Hgb urine dipstick: NEGATIVE
KETONES UR: NEGATIVE mg/dL
LEUKOCYTES UA: NEGATIVE
NITRITE: NEGATIVE
PROTEIN: NEGATIVE mg/dL
Specific Gravity, Urine: >1.046 — ABNORMAL HIGH (ref 1.005–1.030)
pH: 6.5 (ref 5.0–8.0)

## 2015-03-29 LAB — BODY FLUID CELL COUNT WITH DIFFERENTIAL
Lymphs, Fluid: 15 %
Monocyte-Macrophage-Serous Fluid: 79 % (ref 50–90)
Neutrophil Count, Fluid: 6 % (ref 0–25)
Total Nucleated Cell Count, Fluid: 175 cu mm (ref 0–1000)

## 2015-03-29 LAB — GRAM STAIN

## 2015-03-29 LAB — LIPASE, BLOOD: LIPASE: 55 U/L — AB (ref 11–51)

## 2015-03-29 LAB — ETHANOL: ALCOHOL ETHYL (B): 153 mg/dL — AB (ref <5)

## 2015-03-29 LAB — URINE MICROSCOPIC-ADD ON

## 2015-03-29 LAB — TSH: TSH: 2.483 u[IU]/mL (ref 0.350–4.500)

## 2015-03-29 LAB — RAPID URINE DRUG SCREEN, HOSP PERFORMED
Amphetamines: NOT DETECTED
BARBITURATES: NOT DETECTED
Benzodiazepines: NOT DETECTED
Cocaine: NOT DETECTED
Opiates: POSITIVE — AB
TETRAHYDROCANNABINOL: NOT DETECTED

## 2015-03-29 LAB — POC OCCULT BLOOD, ED: Fecal Occult Bld: NEGATIVE

## 2015-03-29 LAB — PROTIME-INR
INR: 1.17 (ref 0.00–1.49)
PROTHROMBIN TIME: 15.1 s (ref 11.6–15.2)

## 2015-03-29 LAB — APTT: APTT: 27 s (ref 24–37)

## 2015-03-29 LAB — GLUCOSE, CAPILLARY
GLUCOSE-CAPILLARY: 307 mg/dL — AB (ref 65–99)
Glucose-Capillary: 168 mg/dL — ABNORMAL HIGH (ref 65–99)
Glucose-Capillary: 263 mg/dL — ABNORMAL HIGH (ref 65–99)
Glucose-Capillary: 259 mg/dL — ABNORMAL HIGH (ref 65–99)

## 2015-03-29 LAB — PROTEIN, BODY FLUID

## 2015-03-29 LAB — D-DIMER, QUANTITATIVE: D-Dimer, Quant: 3.16 ug/mL-FEU — ABNORMAL HIGH (ref 0.00–0.50)

## 2015-03-29 MED ORDER — VITAMIN B-1 100 MG PO TABS
100.0000 mg | ORAL_TABLET | Freq: Every day | ORAL | Status: DC
  Administered 2015-03-29 – 2015-04-03 (×6): 100 mg via ORAL
  Filled 2015-03-29 (×6): qty 1

## 2015-03-29 MED ORDER — LORAZEPAM 2 MG/ML IJ SOLN: 1.0000 mg | Freq: Four times a day (QID) | INTRAMUSCULAR | Status: AC | PRN

## 2015-03-29 MED ORDER — ONDANSETRON HCL 4 MG/2ML IJ SOLN: 4.0000 mg | Freq: Four times a day (QID) | INTRAMUSCULAR | Status: DC | PRN

## 2015-03-29 MED ORDER — FOLIC ACID 1 MG PO TABS
1.0000 mg | ORAL_TABLET | Freq: Every day | ORAL | Status: DC
  Administered 2015-03-29 – 2015-04-03 (×6): 1 mg via ORAL
  Filled 2015-03-29 (×6): qty 1

## 2015-03-29 MED ORDER — SERTRALINE HCL 100 MG PO TABS
150.0000 mg | ORAL_TABLET | Freq: Every day | ORAL | Status: DC
  Administered 2015-03-29 – 2015-04-03 (×6): 150 mg via ORAL
  Filled 2015-03-29 (×6): qty 1

## 2015-03-29 MED ORDER — LORAZEPAM 1 MG PO TABS: 1.0000 mg | ORAL_TABLET | Freq: Four times a day (QID) | ORAL | Status: AC | PRN

## 2015-03-29 MED ORDER — ADULT MULTIVITAMIN W/MINERALS CH
1.0000 | ORAL_TABLET | Freq: Every day | ORAL | Status: DC
  Administered 2015-03-29 – 2015-04-03 (×6): 1 via ORAL
  Filled 2015-03-29 (×6): qty 1

## 2015-03-29 MED ORDER — DOCUSATE SODIUM 100 MG PO CAPS
100.0000 mg | ORAL_CAPSULE | Freq: Two times a day (BID) | ORAL | Status: DC
  Administered 2015-03-29 – 2015-04-03 (×11): 100 mg via ORAL
  Filled 2015-03-29 (×12): qty 1

## 2015-03-29 MED ORDER — THIAMINE HCL 100 MG/ML IJ SOLN
100.0000 mg | Freq: Every day | INTRAMUSCULAR | Status: DC
  Filled 2015-03-29 (×6): qty 1

## 2015-03-29 MED ORDER — ADULT MULTIVITAMIN W/MINERALS CH: 1.0000 | ORAL_TABLET | Freq: Every day | ORAL | Status: DC

## 2015-03-29 MED ORDER — INSULIN ASPART 100 UNIT/ML ~~LOC~~ SOLN
0.0000 [IU] | Freq: Three times a day (TID) | SUBCUTANEOUS | Status: DC
  Administered 2015-03-29: 2 [IU] via SUBCUTANEOUS
  Administered 2015-03-29: 7 [IU] via SUBCUTANEOUS
  Administered 2015-03-30: 3 [IU] via SUBCUTANEOUS
  Administered 2015-03-30: 7 [IU] via SUBCUTANEOUS
  Administered 2015-03-31: 3 [IU] via SUBCUTANEOUS
  Administered 2015-03-31: 2 [IU] via SUBCUTANEOUS
  Administered 2015-04-01 (×2): 1 [IU] via SUBCUTANEOUS
  Administered 2015-04-01 – 2015-04-02 (×2): 2 [IU] via SUBCUTANEOUS
  Administered 2015-04-02: 7 [IU] via SUBCUTANEOUS
  Administered 2015-04-03 (×2): 2 [IU] via SUBCUTANEOUS

## 2015-03-29 MED ORDER — SPIRONOLACTONE 50 MG PO TABS
50.0000 mg | ORAL_TABLET | Freq: Every day | ORAL | Status: DC
  Administered 2015-03-29 – 2015-03-30 (×2): 50 mg via ORAL
  Filled 2015-03-29 (×3): qty 1

## 2015-03-29 MED ORDER — FUROSEMIDE 40 MG PO TABS
40.0000 mg | ORAL_TABLET | Freq: Every day | ORAL | Status: DC
  Administered 2015-03-29 – 2015-03-30 (×2): 40 mg via ORAL
  Filled 2015-03-29 (×2): qty 1

## 2015-03-29 MED ORDER — ACETAMINOPHEN 325 MG PO TABS: 650.0000 mg | ORAL_TABLET | Freq: Four times a day (QID) | ORAL | Status: DC | PRN

## 2015-03-29 MED ORDER — FENTANYL CITRATE (PF) 100 MCG/2ML IJ SOLN
25.0000 ug | INTRAMUSCULAR | Status: DC | PRN
  Administered 2015-03-29 – 2015-03-30 (×6): 25 ug via INTRAVENOUS
  Filled 2015-03-29 (×6): qty 2

## 2015-03-29 MED ORDER — INSULIN GLARGINE 100 UNIT/ML ~~LOC~~ SOLN
15.0000 [IU] | Freq: Every day | SUBCUTANEOUS | Status: DC
  Administered 2015-03-29: 15 [IU] via SUBCUTANEOUS
  Filled 2015-03-29 (×3): qty 0.15

## 2015-03-29 MED ORDER — ONDANSETRON HCL 4 MG PO TABS: 4.0000 mg | ORAL_TABLET | Freq: Four times a day (QID) | ORAL | Status: DC | PRN

## 2015-03-29 MED ORDER — TECHNETIUM TC 99M MEBROFENIN IV KIT
5.5000 | PACK | Freq: Once | INTRAVENOUS | Status: AC | PRN
  Administered 2015-03-29: 6 via INTRAVENOUS

## 2015-03-29 MED ORDER — SODIUM CHLORIDE 0.9 % IJ SOLN
3.0000 mL | Freq: Two times a day (BID) | INTRAMUSCULAR | Status: DC
  Administered 2015-03-30 – 2015-04-03 (×7): 3 mL via INTRAVENOUS

## 2015-03-29 MED ORDER — CALCIUM CARBONATE ANTACID 500 MG PO CHEW: 3.0000 | CHEWABLE_TABLET | Freq: Four times a day (QID) | ORAL | Status: DC | PRN

## 2015-03-29 MED ORDER — POTASSIUM CHLORIDE CRYS ER 10 MEQ PO TBCR
10.0000 meq | EXTENDED_RELEASE_TABLET | Freq: Every day | ORAL | Status: DC
  Administered 2015-03-29 – 2015-03-31 (×3): 10 meq via ORAL
  Filled 2015-03-29 (×3): qty 1

## 2015-03-29 MED ORDER — MORPHINE SULFATE (PF) 4 MG/ML IV SOLN
4.0000 mg | Freq: Once | INTRAVENOUS | Status: AC
  Administered 2015-03-29: 4 mg via INTRAVENOUS
  Filled 2015-03-29: qty 1

## 2015-03-29 MED ORDER — PANTOPRAZOLE SODIUM 40 MG PO TBEC
40.0000 mg | DELAYED_RELEASE_TABLET | Freq: Every day | ORAL | Status: DC
  Administered 2015-03-29 – 2015-04-03 (×6): 40 mg via ORAL
  Filled 2015-03-29 (×7): qty 1

## 2015-03-29 MED ORDER — DEXTROSE 5 % IV SOLN
2.0000 g | INTRAVENOUS | Status: DC
  Administered 2015-03-29 – 2015-03-30 (×2): 2 g via INTRAVENOUS
  Filled 2015-03-29 (×2): qty 2

## 2015-03-29 MED ORDER — ACETAMINOPHEN 650 MG RE SUPP: 650.0000 mg | Freq: Four times a day (QID) | RECTAL | Status: DC | PRN

## 2015-03-29 MED ORDER — DIPHENHYDRAMINE HCL 25 MG PO CAPS
50.0000 mg | ORAL_CAPSULE | Freq: Every evening | ORAL | Status: DC | PRN
  Administered 2015-03-31 – 2015-04-03 (×3): 50 mg via ORAL
  Filled 2015-03-29 (×3): qty 2

## 2015-03-29 MED ORDER — INSULIN GLARGINE 100 UNIT/ML ~~LOC~~ SOLN
15.0000 [IU] | Freq: Every day | SUBCUTANEOUS | Status: DC
Start: 2015-03-29 — End: 2015-03-29
  Administered 2015-03-29: 15 [IU] via SUBCUTANEOUS
  Filled 2015-03-29: qty 0.15

## 2015-03-29 NOTE — H&P (Signed)
[facility] History and Physical  [name] [id number] DOB: Sep 14, 1965 DOA: 03/28/2015  Referring physician: Dr. Leonides Schanz. PCP: Cathlean Cower, MD  Specialists: Dr.Gessner. Gastroenterologist.  Chief Complaint: Abdominal distention.  HPI: [name] is a 49 y.o. female with known history of cirrhosis of the liver secondary to alcoholism and hepatitis C, diabetes mellitus type 2 presents to the apical of increasing abdominal distention with discomfort over the last 2 weeks. Patient states she has been compliant with her medications. Patient has been having some nausea vomiting everyday in the morning but denies any diarrhea. In the ER on exam patient has distended abdomen with ascites and also was mildly hypoxic requiring oxygen. Patient is not in distress but is tachycardic. Patient's hypoxia is probably from distended abdomen. Patient is being admitted for requiring for paracentesis and further management of her decompensated cirrhosis. Patient is presently afebrile and states her abdominal pain is generalized.   Review of Systems: As presented in the history of presenting illness, rest negative.  Past Medical History  Diagnosis Date  . Impaired glucose tolerance 11/07/2010  . Hepatitis C approx dx 2005    no tx to date  . Pancreatitis     HX of  . Alcohol dependency ([place])     at least [facility] admissions 2007- 09/2013 for detox.   Marland Kitchen GERD (gastroesophageal reflux disease)   . Chronic constipation   . Gallstones   . Drug dependency ([place])     Hx of cocaine use  . Anxiety and depression 08/2011  . Rheumatoid factor positive   . Hemochromatosis     last phlebotomy tx ~ 2012. liver bx 2006  . Pancytopenia   . Abnormal vaginal bleeding     uterine fibroid  . Gallstone 2006  . Chronic abdominal pain 2011  . Cirrhosis ([place]) 11/09/2010  . Type II or unspecified type diabetes mellitus without mention of complication, uncontrolled 09/17/2011  . Major depression ([place]) 11/2012, 09/2013    [facility]  admissions for this, suicide attempt by OD Ambien (2014), Prozac (2015),ETOHism   . Anxiety    Past Surgical History  Procedure Laterality Date  . Appendectomy    . Cesarean section  x 2  . Foot surgery  2013  . Abdominal hysterectomy  12/17/2011    Procedure: HYSTERECTOMY ABDOMINAL;  Surgeon: Gus Height, MD;  Location: [facility];  Service: Gynecology;  Laterality: N/A;  . Salpingoophorectomy  12/17/2011    Procedure: SALPINGO OOPHERECTOMY;  Surgeon: Gus Height, MD;  Location: [facility];  Service: Gynecology;  Laterality: Bilateral;  . Esophagogastroduodenoscopy N/A 12/10/2013    Procedure: ESOPHAGOGASTRODUODENOSCOPY (EGD);  Surgeon: Ladene Artist, MD;  Location: [facility];  Service: Endoscopy;  Laterality: N/A;  . Percutaneous liver biopsy  2011  . Colonoscopy N/A 02/18/2014    Procedure: COLONOSCOPY;  Surgeon: Gatha Mayer, MD;  Location: [facility];  Service: Endoscopy;  Laterality: N/A;   Social History:  reports that she has never smoked. She has never used smokeless tobacco. She reports that she drinks alcohol. She reports that she does not use illicit drugs. Where does patient live home. Can patient participate in ADLs? Yes.  Allergies  Allergen Reactions  . Betadine [Povidone Iodine] Hives    Family History:  Family History  Problem Relation Age of Onset  . Cancer Mother     ovarian  . Diabetes Father       Prior to Admission medications   Medication Sig Start Date End Date Taking? Authorizing Provider  calcium carbonate (  TUMS EX) 750 MG chewable tablet Chew 3 tablets by mouth 4 (four) times daily as needed for heartburn.   Yes Historical Provider, MD  diphenhydrAMINE (BENADRYL) 25 MG tablet Take 50 mg by mouth at bedtime as needed for sleep.    Yes Historical Provider, MD  docusate sodium (COLACE) 100 MG capsule Take 1 capsule (100 mg total) by mouth 2 (two) times daily. 11/15/14  Yes Simbiso Ranga, MD  esomeprazole (NEXIUM) 40 MG capsule Take 40 mg by mouth daily as  needed (heartburn).   Yes Historical Provider, MD  furosemide (LASIX) 40 MG tablet Take 1 tablet (40 mg total) by mouth daily. 03/02/15  Yes Waynetta Pean, PA-C  ibuprofen (ADVIL,MOTRIN) 400 MG tablet Take 1 tablet (400 mg total) by mouth every 6 (six) hours as needed. 03/18/15  Yes Nona Dell, PA-C  insulin aspart (NOVOLOG) 100 UNIT/ML injection Inject 3 Units into the skin 3 (three) times daily before meals. Patient taking differently: Inject 5 Units into the skin 3 (three) times daily with meals.  07/26/14  Yes Belkys A Regalado, MD  insulin glargine (LANTUS) 100 UNIT/ML injection Inject 0.15 mLs (15 Units total) into the skin at bedtime. 01/20/15  Yes Biagio Borg, MD  KLOR-CON M10 10 MEQ tablet Take 10 mEq by mouth daily.  11/15/14  Yes Historical Provider, MD  metFORMIN (GLUCOPHAGE) 500 MG tablet Take 500 mg by mouth 2 (two) times daily. 10/25/14  Yes Historical Provider, MD  Multiple Vitamin (MULTIVITAMIN WITH MINERALS) TABS tablet Take 1 tablet by mouth daily.    Yes Historical Provider, MD  NOVOLOG FLEXPEN 100 UNIT/ML FlexPen INJECT 2 UNITS INTO SKIN 3 TIMES DAILY BEFORE MEALS Patient taking differently: INJECT 5 UNITS INTO SKIN 3 TIMES DAILY BEFORE MEALS 01/13/15  Yes Biagio Borg, MD  sertraline (ZOLOFT) 100 MG tablet Take 150 mg by mouth daily.   Yes Historical Provider, MD  spironolactone (ALDACTONE) 50 MG tablet TAKE 1 TABLET BY MOUTH EVERY DAY 02/17/15  Yes Biagio Borg, MD  lactulose (CHRONULAC) 10 GM/15ML solution Take 45 mLs (30 g total) by mouth daily. Patient not taking: Reported on 03/28/2015 01/20/15   Biagio Borg, MD  oxyCODONE (OXY IR/ROXICODONE) 5 MG immediate release tablet 1/2 - 1 tab by mouth daily prn, to fill Jan 30, 2015 Patient not taking: Reported on 03/28/2015 01/20/15   Biagio Borg, MD  oxyCODONE-acetaminophen (PERCOCET/ROXICET) 5-325 MG tablet Take 1 tablet by mouth every 4 (four) hours as needed for severe pain. Patient not taking: Reported on 03/28/2015  03/02/15   Okey Regal, PA-C  spironolactone (ALDACTONE) 100 MG tablet Take 1 tablet (100 mg total) by mouth daily. Patient not taking: Reported on 03/28/2015 01/05/15   Charlynne Cousins, MD    Physical Exam: Filed Vitals:   03/28/15 2248 03/29/15 0136 03/29/15 0300 03/29/15 0402  BP: 121/93 124/98 116/96   Pulse: 125 122    Temp: 98.5 F (36.9 C)     TempSrc: Oral     Resp: 19 12 16    Height:    5\' 2"  (1.575 m)  Weight:    51.211 kg (112 lb 14.4 oz)  SpO2: 98% 97%       General:  Moderately built and poorly nourished.  Eyes: Anicteric no pallor.  ENT: No discharge from the ears eyes nose and mouth.  Neck: No JVD appreciated. No mass felt.  Cardiovascular: S1-S2 heard.  Respiratory: No rhonchi or crepitations.  Abdomen: Distended abdomen nontender bowel sounds present.  Skin: No rash.  Musculoskeletal: No edema.  Psychiatric: Appears normal.  Neurologic: Alert awake oriented to time place and person. Moves all extremities.  Labs on Admission:  Basic Metabolic Panel:  Recent Labs Lab 03/28/15 2330  NA 139  K 3.7  CL 101  CO2 26  GLUCOSE 351*  BUN 6  CREATININE 0.40*  CALCIUM 9.1   Liver Function Tests:  Recent Labs Lab 03/28/15 2330  AST 112*  ALT 71*  ALKPHOS 167*  BILITOT 1.0  PROT 7.6  ALBUMIN 2.9*    Recent Labs Lab 03/28/15 2330  LIPASE 55*   No results for input(s): AMMONIA in the last 168 hours. CBC:  Recent Labs Lab 03/28/15 2330  WBC 3.1*  NEUTROABS 2.0  HGB 14.6  HCT 41.9  MCV 104.2*  PLT 88*   Cardiac Enzymes: No results for input(s): CKTOTAL, CKMB, CKMBINDEX, TROPONINI in the last 168 hours.  BNP (last 3 results) No results for input(s): BNP in the last 8760 hours.  ProBNP (last 3 results) No results for input(s): PROBNP in the last 8760 hours.  CBG: No results for input(s): GLUCAP in the last 168 hours.  Radiological Exams on Admission: Dg Chest 2 View  03/29/2015  CLINICAL DATA:  Acute onset of  shortness of breath and generalized abdominal distention. Initial encounter. EXAM: CHEST  2 VIEW COMPARISON:  Chest radiograph performed 03/18/2015 FINDINGS: The lungs are hypoexpanded. Vascular crowding is noted. Mild bibasilar opacities likely reflect atelectasis. No pleural effusion or pneumothorax is seen. The cardiomediastinal silhouette is borderline normal in size. No acute osseous abnormalities are identified. IMPRESSION: Lungs hypoexpanded. Mild bibasilar airspace opacities likely reflect atelectasis. Electronically Signed   By: Garald Balding M.D.   On: 03/29/2015 01:57    EKG: Independently reviewed. Sinus tachycardia.  Assessment/Plan Principal Problem:   Alcoholic cirrhosis of liver with ascites (HCC) Active Problems:   Thrombocytopenia ([place])   Ascites   Poorly controlled type 2 diabetes mellitus ([place])   1. Ascites with decompensated cirrhosis of the liver - patient will be continued on her Lasix and spironolactone. I have requested ultrasound-guided paracentesis. Until paracentesis labs are available patient will be placed on ceftriaxone to cover for possible SBP. 2. Abdominal discomfort most likely from ascites - patient has abdominal distention with discomfort. However patient has history of gallstones for which I have ordered a HIDA scan. Patient is also empirically on ceftriaxone for SBP until labs for paracentesis are unavailable. 3. Diabetes mellitus type 2 uncontrolled - patient did miss her last night's dose of Lantus which I have ordered 1 dose now. Closely follow CBGs with sliding scale coverage. 4. Hypoxia and sinus tachycardia - probably from ascites and discomfort. Check TSH and d-dimer. 5. Chronic thrombocytopenia probably from cirrhosis and alcoholism - follow CBC closely. 6. History of alcoholism - patient did have a drink yesterday. Patient has been placed on CIWA protocol. 7. Elevated LFTs - probably from alcoholism. Check HIDA scan. Follow LFTs closely.  I have  reviewed patient's old charts and lateral chest. Personally reviewed patient's chest x-ray and EKG.   DVT Prophylaxis SCDs.  Code Status: Full code.  Family Communication: Discussed with patient.  Disposition Plan: Admit for observation.    Landi Biscardi N. [facility] Pager [phone].  If 7PM-7AM, please contact night-coverage [email or web address] Password [facility] 03/29/2015, 4:49 AM

## 2015-03-29 NOTE — Progress Notes (Signed)
[facility] PROGRESS NOTE  [name] [id number] DOB: Aug 23, 1965 DOA: 03/28/2015 PCP: Cathlean Cower, MD  HPI/Brief narrative 49 y.o. 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 Patient was noted to have increased ascites and was mildly hypoxic. The patient was admitted for further work up.  Assessment/Plan: 1. Ascites with decompensated cirrhosis of the liver  1. patient has been continued on Lasix and spironolactone.  2. Pt now s/p ultrasound-guided paracentesis yielding 3.7L fluid 3. Pt had been continued on ceftriaxone to cover for possible SBP. 2. Abdominal discomfort most likely from ascites 1. Pain likely secondary to ascites 2. Given history of gallstones, have ordered HIDA scan, pending.  3. Patient is also empirically on ceftriaxone for SBP until labs for paracentesis are unavailable. 3. Diabetes mellitus type 2 uncontrolled  1. patient did miss her last night's dose of Lantus which I have ordered 1 dose now.  2. Cont to follow CBGs with sliding scale coverage. 4. Hypoxia and sinus tachycardia  1. probably from ascites and discomfort.  5. Chronic thrombocytopenia  1. probably from cirrhosis and alcoholism 2. Cont to follow CBC closely. 6. History of alcoholism 1. Reports last ETOH intake one day prior to admit  2. Cont on CIWA protocol. 7. Elevated LFTs 1. HIDA scan pending 2. Question elevated LFT's from cirrhosis vs etoh hepatitis 3. Cont to follow LFTs closely.  Code Status: Full Family Communication: Pt in room Disposition Plan: Possible d/c in 24-48hrs if stable   Consultants:    Procedures:  US guided paracentesis  Antibiotics: Anti-infectives    Start     Dose/Rate Route Frequency Ordered Stop   03/29/15 0600  cefTRIAXone (ROCEPHIN) 2 g in dextrose 5 % 50 mL  IVPB     2 g 100 mL/hr over 30 Minutes Intravenous Every 24 hours 03/29/15 0459        HPI/Subjective: Pt reports continued abd fullness  Objective: Filed Vitals:   03/29/15 1111 03/29/15 1115 03/29/15 1124 03/29/15 1545  BP: 115/84 115/84 121/80 123/88  Pulse:    106  Temp:    98.6 F (37 C)  TempSrc:    Oral  Resp:    20  Height:      Weight:      SpO2:    97%    Intake/Output Summary (Last 24 hours) at 03/29/15 1618 Last data filed at 03/29/15 1328  Gross per 24 hour  Intake    290 ml  Output    400 ml  Net   -110 ml   Filed Weights   03/29/15 0402  Weight: 51.211 kg (112 lb 14.4 oz)    Exam:   General:  Awake, in nad  Cardiovascular: regular, s1, s2  Respiratory: normal resp effort, no wheezing  Abdomen: distended, decreased BS  Musculoskeletal: perfused, no clubbing   Data Reviewed: Basic Metabolic Panel:  Recent Labs Lab 03/28/15 2330 03/29/15 0518  NA 139 134*  K 3.7 3.5  CL 101 100*  CO2 26 24  GLUCOSE 351* 336*  BUN 6 7  CREATININE 0.40* 0.37*  CALCIUM 9.1 8.3*   Liver Function Tests:  Recent Labs Lab 03/28/15 2330 03/29/15 0518  AST 112* 93*  ALT 71* 62*  ALKPHOS 167* 151*  BILITOT 1.0 0.9  PROT 7.6 6.6  ALBUMIN 2.9* 2.6*    Recent Labs Lab  03/28/15 2330  LIPASE 55*   No results for input(s): AMMONIA in the last 168 hours. CBC:  Recent Labs Lab 03/28/15 2330 03/29/15 0518  WBC 3.1* 3.3*  NEUTROABS 2.0 2.0  HGB 14.6 12.9  HCT 41.9 39.2  MCV 104.2* 107.1*  PLT 88* 72*   Cardiac Enzymes: No results for input(s): CKTOTAL, CKMB, CKMBINDEX, TROPONINI in the last 168 hours. BNP (last 3 results) No results for input(s): BNP in the last 8760 hours.  ProBNP (last 3 results) No results for input(s): PROBNP in the last 8760 hours.  CBG:  Recent Labs Lab 03/29/15 0811 03/29/15 1316  GLUCAP 307* 168*    Recent Results (from the past 240 hour(s))  Culture, body fluid-bottle     Status: None (Preliminary  result)   Collection Time: 03/29/15 12:43 PM  Result Value Ref Range Status   Specimen Description FLUID PERITONEAL  Final   Special Requests NONE Performed at [facility]   Final   Culture PENDING  Incomplete   Report Status PENDING  Incomplete  Gram stain     Status: None   Collection Time: 03/29/15 12:43 PM  Result Value Ref Range Status   Specimen Description FLUID PERITONEAL  Final   Special Requests NONE  Final   Gram Stain   Final    RARE WBC PRESENT,BOTH PMN AND MONONUCLEAR NO ORGANISMS SEEN Performed at [facility]    Report Status 03/29/2015 FINAL  Final     Studies: Dg Chest 2 View  03/29/2015  CLINICAL DATA:  Acute onset of shortness of breath and generalized abdominal distention. Initial encounter. EXAM: CHEST  2 VIEW COMPARISON:  Chest radiograph performed 03/18/2015 FINDINGS: The lungs are hypoexpanded. Vascular crowding is noted. Mild bibasilar opacities likely reflect atelectasis. No pleural effusion or pneumothorax is seen. The cardiomediastinal silhouette is borderline normal in size. No acute osseous abnormalities are identified. IMPRESSION: Lungs hypoexpanded. Mild bibasilar airspace opacities likely reflect atelectasis. Electronically Signed   By: Garald Balding M.D.   On: 03/29/2015 01:57   Nm Hepatobiliary Liver Func  03/29/2015  CLINICAL DATA:  Generalized abdominal pain. Nausea and vomiting for 2 weeks. Abdominal distention. Personal history of pancreatitis EXAM: NUCLEAR MEDICINE HEPATOBILIARY IMAGING TECHNIQUE: Sequential images of the abdomen were obtained out to 60 minutes following intravenous administration of radiopharmaceutical. RADIOPHARMACEUTICALS:  5.5 mCi Tc-14m  Choletec IV COMPARISON:  CT of the abdomen and pelvis with contrast 01/02/2015. FINDINGS: Normal hepatic uptake is present. The gallbladder can be seen by 20 minutes. There is excretion of radiopharmaceutical into the small bowel. IMPRESSION: Negative hepatobiliary scan with  normal hepatic uptake, gallbladder concentration, and excretion. Electronically Signed   By: San Morelle M.D.   On: 03/29/2015 13:24   US Paracentesis  03/29/2015  CLINICAL DATA:  Abdominal distention secondary to recurrent ascites. Request diagnostic and therapeutic paracentesis. EXAM: ULTRASOUND GUIDED PARACENTESIS COMPARISON:  None. PROCEDURE: An ultrasound guided paracentesis was thoroughly discussed with the patient and questions answered. The benefits, risks, alternatives and complications were also discussed. The patient understands and wishes to proceed with the procedure. Written consent was obtained. Ultrasound was performed to localize and mark an adequate pocket of fluid in the right lower quadrant of the abdomen. The area was then prepped and draped in the normal sterile fashion. 1% Lidocaine was used for local anesthesia. Under ultrasound guidance a 19 gauge Yueh catheter was introduced. Paracentesis was performed. The catheter was removed and a dressing applied. COMPLICATIONS: None immediate FINDINGS: A total of approximately 3.7  L of clear yellow fluid was removed. A fluid sample was sent for laboratory analysis. IMPRESSION: Successful ultrasound guided paracentesis yielding 3.7 L of ascites. Read by: Ascencion Dike PA-C Electronically Signed   By: Sandi Mariscal M.D.   On: 03/29/2015 11:51    Scheduled Meds: . cefTRIAXone (ROCEPHIN)  IV  2 g Intravenous Q24H  . docusate sodium  100 mg Oral BID  . folic acid  1 mg Oral Daily  . furosemide  40 mg Oral Daily  . insulin aspart  0-9 Units Subcutaneous TID WC  . insulin glargine  15 Units Subcutaneous QHS  . multivitamin with minerals  1 tablet Oral Daily  . pantoprazole  40 mg Oral Daily  . potassium chloride  10 mEq Oral Daily  . sertraline  150 mg Oral Daily  . sodium chloride  3 mL Intravenous Q12H  . spironolactone  50 mg Oral Daily  . thiamine  100 mg Oral Daily   Or  . thiamine  100 mg Intravenous Daily   Continuous  Infusions:   Principal Problem:   Alcoholic cirrhosis of liver with ascites (HCC) Active Problems:   Thrombocytopenia ([place])   Ascites   Poorly controlled type 2 diabetes mellitus ([address] [place] [address])   Byard Carranza, [place] [facility] Pager [phone]. If 7PM-7AM, please contact night-coverage at [email or web address], password [facility] 03/29/2015, 4:18 PM  LOS: 0 days

## 2015-03-29 NOTE — Progress Notes (Signed)
ANTIBIOTIC CONSULT NOTE - INITIAL  Pharmacy Consult for Rocephin Indication: SBP  Allergies  Allergen Reactions  . Betadine [Povidone Iodine] Hives    Patient Measurements: Height: 5\' 2"  (157.5 cm) Weight: 112 lb 14.4 oz (51.211 kg) IBW/kg (Calculated) : 50.1   Vital Signs: Temp: 98.5 F (36.9 C) (12/06 2248) Temp Source: Oral (12/06 2248) BP: 116/96 mmHg (12/07 0300) Pulse Rate: 122 (12/07 0136) Intake/Output from previous day:   Intake/Output from this shift:    Labs:  Recent Labs  03/28/15 2330  WBC 3.1*  HGB 14.6  PLT 88*  CREATININE 0.40*   Estimated Creatinine Clearance: 67.3 mL/min (by C-G formula based on Cr of 0.4). No results for input(s): VANCOTROUGH, VANCOPEAK, VANCORANDOM, GENTTROUGH, GENTPEAK, GENTRANDOM, TOBRATROUGH, TOBRAPEAK, TOBRARND, AMIKACINPEAK, AMIKACINTROU, AMIKACIN in the last 72 hours.   Microbiology: Recent Results (from the past 720 hour(s))  Culture, body fluid-bottle     Status: None   Collection Time: 03/02/15  3:24 PM  Result Value Ref Range Status   Specimen Description FLUID ASCITIC  Final   Special Requests NONE  Final   Culture NO GROWTH 5 DAYS  Final   Report Status 03/07/2015 FINAL  Final  Gram stain     Status: None   Collection Time: 03/02/15  3:24 PM  Result Value Ref Range Status   Specimen Description FLUID ASCITIC  Final   Special Requests NONE  Final   Gram Stain   Final    FEW WBC PRESENT, PREDOMINANTLY MONONUCLEAR NO ORGANISMS SEEN    Report Status 03/02/2015 FINAL  Final    Medical History: Past Medical History  Diagnosis Date  . Impaired glucose tolerance 11/07/2010  . Hepatitis C approx dx 2005    no tx to date  . Pancreatitis     HX of  . Alcohol dependency ([place])     at least [place] admissions 2007- 09/2013 for detox.   Marland Kitchen GERD (gastroesophageal reflux disease)   . Chronic constipation   . Gallstones   . Drug dependency ([place])     Hx of cocaine use  . Anxiety and depression 08/2011  . Rheumatoid  factor positive   . Hemochromatosis     last phlebotomy tx ~ 2012. liver bx 2006  . Pancytopenia   . Abnormal vaginal bleeding     uterine fibroid  . Gallstone 2006  . Chronic abdominal pain 2011  . Cirrhosis ([place]) 11/09/2010  . Type II or unspecified type diabetes mellitus without mention of complication, uncontrolled 09/17/2011  . Major depression ([place]) 11/2012, 09/2013    [facility] admissions for this, suicide attempt by OD Ambien (2014), Prozac (2015),ETOHism   . Anxiety     Medications:  Prescriptions prior to admission  Medication Sig Dispense Refill Last Dose  . calcium carbonate (TUMS EX) 750 MG chewable tablet Chew 3 tablets by mouth 4 (four) times daily as needed for heartburn.   03/28/2015 at Unknown time  . diphenhydrAMINE (BENADRYL) 25 MG tablet Take 50 mg by mouth at bedtime as needed for sleep.    Past Month at Unknown time  . docusate sodium (COLACE) 100 MG capsule Take 1 capsule (100 mg total) by mouth 2 (two) times daily. 10 capsule 0 03/28/2015 at Unknown time  . esomeprazole (NEXIUM) 40 MG capsule Take 40 mg by mouth daily as needed (heartburn).   Past Month at Unknown time  . furosemide (LASIX) 40 MG tablet Take 1 tablet (40 mg total) by mouth daily. 30 tablet 0 03/28/2015 at Unknown  time  . ibuprofen (ADVIL,MOTRIN) 400 MG tablet Take 1 tablet (400 mg total) by mouth every 6 (six) hours as needed. 30 tablet 0 03/28/2015 at Unknown time  . insulin aspart (NOVOLOG) 100 UNIT/ML injection Inject 3 Units into the skin 3 (three) times daily before meals. (Patient taking differently: Inject 5 Units into the skin 3 (three) times daily with meals. ) 10 mL 11 03/28/2015 at Unknown time  . insulin glargine (LANTUS) 100 UNIT/ML injection Inject 0.15 mLs (15 Units total) into the skin at bedtime. 10 mL 11 03/27/2015 at Unknown time  . KLOR-CON M10 10 MEQ tablet Take 10 mEq by mouth daily.    03/28/2015 at Unknown time  . metFORMIN (GLUCOPHAGE) 500 MG tablet Take 500 mg by mouth 2 (two) times daily.    03/28/2015 at Unknown time  . Multiple Vitamin (MULTIVITAMIN WITH MINERALS) TABS tablet Take 1 tablet by mouth daily.    03/28/2015 at Unknown time  . NOVOLOG FLEXPEN 100 UNIT/ML FlexPen INJECT 2 UNITS INTO SKIN 3 TIMES DAILY BEFORE MEALS (Patient taking differently: INJECT 5 UNITS INTO SKIN 3 TIMES DAILY BEFORE MEALS) 15 mL 2 unknown at unknown  . sertraline (ZOLOFT) 100 MG tablet Take 150 mg by mouth daily.   03/28/2015 at Unknown time  . spironolactone (ALDACTONE) 50 MG tablet TAKE 1 TABLET BY MOUTH EVERY DAY 30 tablet 5 03/28/2015 at Unknown time  . lactulose (CHRONULAC) 10 GM/15ML solution Take 45 mLs (30 g total) by mouth daily. (Patient not taking: Reported on 03/28/2015) 240 mL 11 03/17/2015 at Unknown time  . oxyCODONE (OXY IR/ROXICODONE) 5 MG immediate release tablet 1/2 - 1 tab by mouth daily prn, to fill Jan 30, 2015 (Patient not taking: Reported on 03/28/2015) 30 tablet 0 Past Month at Unknown time  . oxyCODONE-acetaminophen (PERCOCET/ROXICET) 5-325 MG tablet Take 1 tablet by mouth every 4 (four) hours as needed for severe pain. (Patient not taking: Reported on 03/28/2015) 6 tablet 0 2 weeks  . spironolactone (ALDACTONE) 100 MG tablet Take 1 tablet (100 mg total) by mouth daily. (Patient not taking: Reported on 03/28/2015) 30 tablet 0 03/17/2015 at Unknown time   Scheduled:  . cefTRIAXone (ROCEPHIN)  IV  2 g Intravenous Q24H  . docusate sodium  100 mg Oral BID  . folic acid  1 mg Oral Daily  . furosemide  40 mg Oral Daily  . insulin aspart  0-9 Units Subcutaneous TID WC  . insulin glargine  15 Units Subcutaneous QHS  . multivitamin with minerals  1 tablet Oral Daily  . pantoprazole  40 mg Oral Daily  . potassium chloride  10 mEq Oral Daily  . sertraline  150 mg Oral Daily  . sodium chloride  3 mL Intravenous Q12H  . spironolactone  50 mg Oral Daily  . thiamine  100 mg Oral Daily   Or  . thiamine  100 mg Intravenous Daily   Infusions:   Assessment: 77 yoF c/o abdominal pan hx of  pancreatitis and cirrhosis.  Rocephin per Rx for SBP.  Goal of Therapy:  Treat infection  Plan:   Rocephin 2Gm IV q24h  No further adjustments required Rx will sign off  Lawana Pai R 03/29/2015,5:01 AM

## 2015-03-29 NOTE — ED Notes (Signed)
Patient transported to X-ray 

## 2015-03-29 NOTE — Progress Notes (Signed)
Inpatient Diabetes Program Recommendations  AACE/ADA: New Consensus Statement on Inpatient Glycemic Control (2015)  Target Ranges:  Prepandial:   less than 140 mg/dL      Peak postprandial:   less than 180 mg/dL (1-2 hours)      Critically ill patients:  140 - 180 mg/dL   Review of Glycemic Control  Diabetes history: DM Outpatient Diabetes medications: Novolog 5 units tidwc Current orders for Inpatient glycemic control: Lantus 15 units started this am and sensitive correction tidwc  Inpatient Diabetes Program Recommendations:    Glucose high on admission and this am. Lantus just started. If eating, patient may need meal coverage in addition, but correction may be all that is needed. Will follow and glad to assist. Thank you Rosita Kea, RN, MSN, CDE  Diabetes Inpatient Program Office: [phone] Pager: [phone] 8:00 am to 5:00 pm

## 2015-03-29 NOTE — Procedures (Signed)
Successful US guided paracentesis from RLQ.  Yielded 3.7L of clear yellow fluid.  No immediate complications.  Pt tolerated well.   Specimen was sent for labs.  Ascencion Dike PA-C 03/29/2015 11:27 AM

## 2015-03-30 ENCOUNTER — Inpatient Hospital Stay (HOSPITAL_COMMUNITY): Payer: Medicaid Other

## 2015-03-30 DIAGNOSIS — R1084 Generalized abdominal pain: Secondary | ICD-10-CM

## 2015-03-30 DIAGNOSIS — R188 Other ascites: Secondary | ICD-10-CM

## 2015-03-30 DIAGNOSIS — K7031 Alcoholic cirrhosis of liver with ascites: Principal | ICD-10-CM

## 2015-03-30 DIAGNOSIS — E1165 Type 2 diabetes mellitus with hyperglycemia: Secondary | ICD-10-CM

## 2015-03-30 LAB — CBC
HCT: 42.9 % (ref 36.0–46.0)
HEMOGLOBIN: 14.4 g/dL (ref 12.0–15.0)
MCH: 35.4 pg — AB (ref 26.0–34.0)
MCHC: 33.6 g/dL (ref 30.0–36.0)
MCV: 105.4 fL — AB (ref 78.0–100.0)
Platelets: 81 10*3/uL — ABNORMAL LOW (ref 150–400)
RBC: 4.07 MIL/uL (ref 3.87–5.11)
RDW: 13.6 % (ref 11.5–15.5)
WBC: 3.6 10*3/uL — ABNORMAL LOW (ref 4.0–10.5)

## 2015-03-30 LAB — COMPREHENSIVE METABOLIC PANEL
ALT: 57 U/L — AB (ref 14–54)
AST: 81 U/L — ABNORMAL HIGH (ref 15–41)
Albumin: 2.5 g/dL — ABNORMAL LOW (ref 3.5–5.0)
Alkaline Phosphatase: 145 U/L — ABNORMAL HIGH (ref 38–126)
Anion gap: 7 (ref 5–15)
BILIRUBIN TOTAL: 1.5 mg/dL — AB (ref 0.3–1.2)
BUN: 11 mg/dL (ref 6–20)
CALCIUM: 8.1 mg/dL — AB (ref 8.9–10.3)
CHLORIDE: 96 mmol/L — AB (ref 101–111)
CO2: 31 mmol/L (ref 22–32)
CREATININE: 0.42 mg/dL — AB (ref 0.44–1.00)
Glucose, Bld: 260 mg/dL — ABNORMAL HIGH (ref 65–99)
Potassium: 3.8 mmol/L (ref 3.5–5.1)
Sodium: 134 mmol/L — ABNORMAL LOW (ref 135–145)
TOTAL PROTEIN: 6.4 g/dL — AB (ref 6.5–8.1)

## 2015-03-30 LAB — BASIC METABOLIC PANEL
Anion gap: 5 (ref 5–15)
BUN: 9 mg/dL (ref 6–20)
CHLORIDE: 94 mmol/L — AB (ref 101–111)
CO2: 36 mmol/L — AB (ref 22–32)
CREATININE: 0.46 mg/dL (ref 0.44–1.00)
Calcium: 8.7 mg/dL — ABNORMAL LOW (ref 8.9–10.3)
GFR calc non Af Amer: >60 mL/min (ref >60)
Glucose, Bld: 103 mg/dL — ABNORMAL HIGH (ref 65–99)
Potassium: 3.8 mmol/L (ref 3.5–5.1)
Sodium: 135 mmol/L (ref 135–145)

## 2015-03-30 LAB — GLUCOSE, CAPILLARY
GLUCOSE-CAPILLARY: 208 mg/dL — AB (ref 65–99)
Glucose-Capillary: 338 mg/dL — ABNORMAL HIGH (ref 65–99)
Glucose-Capillary: 116 mg/dL — ABNORMAL HIGH (ref 65–99)
Glucose-Capillary: 190 mg/dL — ABNORMAL HIGH (ref 65–99)

## 2015-03-30 MED ORDER — HYDROMORPHONE HCL 1 MG/ML IJ SOLN
0.5000 mg | Freq: Once | INTRAMUSCULAR | Status: AC
  Administered 2015-03-30: 0.5 mg via INTRAVENOUS
  Filled 2015-03-30: qty 1

## 2015-03-30 MED ORDER — INSULIN GLARGINE 100 UNIT/ML ~~LOC~~ SOLN
18.0000 [IU] | Freq: Every day | SUBCUTANEOUS | Status: DC
  Administered 2015-03-30 – 2015-04-02 (×4): 18 [IU] via SUBCUTANEOUS
  Filled 2015-03-30 (×5): qty 0.18

## 2015-03-30 MED ORDER — HYDROMORPHONE HCL 1 MG/ML IJ SOLN
0.5000 mg | INTRAMUSCULAR | Status: DC | PRN
  Administered 2015-03-30 – 2015-04-02 (×16): 0.5 mg via INTRAVENOUS
  Filled 2015-03-30 (×16): qty 1

## 2015-03-30 MED ORDER — IOHEXOL 300 MG/ML  SOLN: 25.0000 mL | INTRAMUSCULAR | Status: AC

## 2015-03-30 MED ORDER — IOHEXOL 300 MG/ML  SOLN
100.0000 mL | Freq: Once | INTRAMUSCULAR | Status: AC | PRN
  Administered 2015-03-30: 100 mL via INTRAVENOUS

## 2015-03-30 MED ORDER — INSULIN ASPART 100 UNIT/ML ~~LOC~~ SOLN
5.0000 [IU] | Freq: Three times a day (TID) | SUBCUTANEOUS | Status: DC
  Administered 2015-03-30 – 2015-04-03 (×11): 5 [IU] via SUBCUTANEOUS

## 2015-03-30 NOTE — Progress Notes (Signed)
Inpatient Diabetes Program Recommendations  AACE/ADA: New Consensus Statement on Inpatient Glycemic Control (2015)  Target Ranges:  Prepandial:   less than 140 mg/dL      Peak postprandial:   less than 180 mg/dL (1-2 hours)      Critically ill patients:  140 - 180 mg/dL    Results for [name], [name] (MRN [id number]) as of 03/30/2015 09:14  Ref. Range 03/29/2015 08:11 03/29/2015 13:16 03/29/2015 17:32 03/29/2015 21:47  Glucose-Capillary Latest Ref Range: 65-99 mg/dL 307 (H) 168 (H) 263 (H) 259 (H)    Results for [name], [name] (MRN [id number]) as of 03/30/2015 09:14  Ref. Range 03/30/2015 07:26  Glucose-Capillary Latest Ref Range: 65-99 mg/dL 208 (H)    Home DM Meds: Lantus 15 units QHS       Novolog 5 units tidwc       Metformin 500 mg bid  Current Insulin Orders: Lantus 15 units QHS      Novolog Sensitive SSI (0-9 units) TID AC      MD- Please consider the following in-hospital insulin adjustments:  1. Increase Lantus to 18 units QHS  2. Start patient's home dose of Novolog- Novolog 5 units tidwc (per records, patient ate 100% breakfast this AM)      --Will follow patient during hospitalization--  Wyn Quaker RN, MSN, CDE Diabetes Coordinator Inpatient Glycemic Control Team Team Pager: [phone] (8a-5p)

## 2015-03-30 NOTE — Consult Note (Signed)
Consultation  Referring Provider: [facility] Primary Care Physician:  Cathlean Cower, MD Primary Gastroenterologist:  Former Deatra Ina  Reason for Consultation:  Ascites  HPI: [name] is a 49 y.o. female alcoholic who is actively drinking(oint of Vodka per day), who was admitted on 03/29/2015 after she presented to the emergency room with complaints of progressive abdominal distention over the previous 2 weeks. She stated that she had been taking her diuretics as instructed, had been on Aldactone 50 mg by mouth daily and Lasix 40 mg by mouth daily. She has not been seen by GI in the past several years other than during an admission in 2015 with bleeding. Has noted weight loss past few months , and progressive abdominal distention since September. States quite painful when tight.  She has history of hepatitis C and also hemachromatosis with prior phlebotomies. She has prior history of cocaine abuse, severe depression and previous suicide attempts, prior history of EtOH-induced pancreatitis, and adult-onset diabetes mellitus. Patient had CT of the abdomen and pelvis done in September 2016 which showed cirrhosis and splenomegaly and just a small amount of ascites at that time. She did not have imaging on admission other than paracentesis which was done yesterday with removal of 3.7 L. She also had a paracentesis in November 2016. Peritoneal fluid cell counts negative for SBP. Cytology was done in November  and showed reactive mesothelial cells  We are asked to consult as she has had reaccumulation of her ascites. Labs reviewed and outlined below, drug screen was positive for opiates on admission negative for cocaine. INR 1.17, EtOH level was 153 on admission. Last EGD August 2015 showed a Mallory-Weiss tear and 3 linear gastric ulcers diffuse gastritis no varices ,and colonoscopy in October 2015 for rectal bleeding 2 small polyps and small internal   Hemorrhoids.  Pt says she felt better  after paracentesis yesterday, and abdomen was softer, but today very tight again and painful especially in upper right abdomen. No fever, no N/V, passing flatus.   Past Medical History  Diagnosis Date  . Impaired glucose tolerance 11/07/2010  . Hepatitis C approx dx 2005    no tx to date  . Pancreatitis     HX of  . Alcohol dependency ([place])     at least [place] admissions 2007- 09/2013 for detox.   Marland Kitchen GERD (gastroesophageal reflux disease)   . Chronic constipation   . Gallstones   . Drug dependency ([place])     Hx of cocaine use  . Anxiety and depression 08/2011  . Rheumatoid factor positive   . Hemochromatosis     last phlebotomy tx ~ 2012. liver bx 2006  . Pancytopenia   . Abnormal vaginal bleeding     uterine fibroid  . Gallstone 2006  . Chronic abdominal pain 2011  . Cirrhosis ([place]) 11/09/2010  . Type II or unspecified type diabetes mellitus without mention of complication, uncontrolled 09/17/2011  . Major depression ([place]) 11/2012, 09/2013    [facility] admissions for this, suicide attempt by OD Ambien (2014), Prozac (2015),ETOHism   . Anxiety     Past Surgical History  Procedure Laterality Date  . Appendectomy    . Cesarean section  x 2  . Foot surgery  2013  . Abdominal hysterectomy  12/17/2011    Procedure: HYSTERECTOMY ABDOMINAL;  Surgeon: Gus Height, MD;  Location: [facility];  Service: Gynecology;  Laterality: N/A;  . Salpingoophorectomy  12/17/2011    Procedure: SALPINGO OOPHERECTOMY;  Surgeon: Gus Height, MD;  Location: [facility];  Service: Gynecology;  Laterality: Bilateral;  . Esophagogastroduodenoscopy N/A 12/10/2013    Procedure: ESOPHAGOGASTRODUODENOSCOPY (EGD);  Surgeon: Ladene Artist, MD;  Location: [facility];  Service: Endoscopy;  Laterality: N/A;  . Percutaneous liver biopsy  2011  . Colonoscopy N/A 02/18/2014    Procedure: COLONOSCOPY;  Surgeon: Gatha Mayer, MD;  Location: [facility];  Service: Endoscopy;  Laterality: N/A;    Prior to Admission medications     Medication Sig Start Date End Date Taking? Authorizing Provider  calcium carbonate (TUMS EX) 750 MG chewable tablet Chew 3 tablets by mouth 4 (four) times daily as needed for heartburn.   Yes Historical Provider, MD  diphenhydrAMINE (BENADRYL) 25 MG tablet Take 50 mg by mouth at bedtime as needed for sleep.    Yes Historical Provider, MD  docusate sodium (COLACE) 100 MG capsule Take 1 capsule (100 mg total) by mouth 2 (two) times daily. 11/15/14  Yes Simbiso Ranga, MD  esomeprazole (NEXIUM) 40 MG capsule Take 40 mg by mouth daily as needed (heartburn).   Yes Historical Provider, MD  furosemide (LASIX) 40 MG tablet Take 1 tablet (40 mg total) by mouth daily. 03/02/15  Yes Waynetta Pean, PA-C  ibuprofen (ADVIL,MOTRIN) 400 MG tablet Take 1 tablet (400 mg total) by mouth every 6 (six) hours as needed. 03/18/15  Yes Nona Dell, PA-C  insulin aspart (NOVOLOG) 100 UNIT/ML injection Inject 3 Units into the skin 3 (three) times daily before meals. Patient taking differently: Inject 5 Units into the skin 3 (three) times daily with meals.  07/26/14  Yes Belkys A Regalado, MD  insulin glargine (LANTUS) 100 UNIT/ML injection Inject 0.15 mLs (15 Units total) into the skin at bedtime. 01/20/15  Yes Biagio Borg, MD  KLOR-CON M10 10 MEQ tablet Take 10 mEq by mouth daily.  11/15/14  Yes Historical Provider, MD  metFORMIN (GLUCOPHAGE) 500 MG tablet Take 500 mg by mouth 2 (two) times daily. 10/25/14  Yes Historical Provider, MD  Multiple Vitamin (MULTIVITAMIN WITH MINERALS) TABS tablet Take 1 tablet by mouth daily.    Yes Historical Provider, MD  NOVOLOG FLEXPEN 100 UNIT/ML FlexPen INJECT 2 UNITS INTO SKIN 3 TIMES DAILY BEFORE MEALS Patient taking differently: INJECT 5 UNITS INTO SKIN 3 TIMES DAILY BEFORE MEALS 01/13/15  Yes Biagio Borg, MD  sertraline (ZOLOFT) 100 MG tablet Take 150 mg by mouth daily.   Yes Historical Provider, MD  spironolactone (ALDACTONE) 50 MG tablet TAKE 1 TABLET BY MOUTH EVERY DAY  02/17/15  Yes Biagio Borg, MD  lactulose (CHRONULAC) 10 GM/15ML solution Take 45 mLs (30 g total) by mouth daily. Patient not taking: Reported on 03/28/2015 01/20/15   Biagio Borg, MD  oxyCODONE (OXY IR/ROXICODONE) 5 MG immediate release tablet 1/2 - 1 tab by mouth daily prn, to fill Jan 30, 2015 Patient not taking: Reported on 03/28/2015 01/20/15   Biagio Borg, MD  oxyCODONE-acetaminophen (PERCOCET/ROXICET) 5-325 MG tablet Take 1 tablet by mouth every 4 (four) hours as needed for severe pain. Patient not taking: Reported on 03/28/2015 03/02/15   Okey Regal, PA-C  spironolactone (ALDACTONE) 100 MG tablet Take 1 tablet (100 mg total) by mouth daily. Patient not taking: Reported on 03/28/2015 01/05/15   Charlynne Cousins, MD    Current Facility-Administered Medications  Medication Dose Route Frequency Provider Last Rate Last Dose  . acetaminophen (TYLENOL) tablet 650 mg  650 mg Oral Q6H PRN Rise Patience, MD  Or  . acetaminophen (TYLENOL) suppository 650 mg  650 mg Rectal Q6H PRN Rise Patience, MD      . calcium carbonate (TUMS - dosed in mg elemental calcium) chewable tablet 600 mg of elemental calcium  3 tablet Oral QID PRN Rise Patience, MD      . diphenhydrAMINE (BENADRYL) capsule 50 mg  50 mg Oral QHS PRN Rise Patience, MD      . docusate sodium (COLACE) capsule 100 mg  100 mg Oral BID Rise Patience, MD   100 mg at 03/30/15 0916  . folic acid (FOLVITE) tablet 1 mg  1 mg Oral Daily Rise Patience, MD   1 mg at 03/30/15 0916  . furosemide (LASIX) tablet 40 mg  40 mg Oral Daily Rise Patience, MD   40 mg at 03/30/15 0916  . HYDROmorphone (DILAUDID) injection 0.5 mg  0.5 mg Intravenous Q4H PRN Donne Hazel, MD   0.5 mg at 03/30/15 1117  . insulin aspart (novoLOG) injection 0-9 Units  0-9 Units Subcutaneous TID WC Rise Patience, MD   7 Units at 03/30/15 1215  . insulin aspart (novoLOG) injection 5 Units  5 Units Subcutaneous TID WC Donne Hazel, MD   5 Units at 03/30/15 1215  . insulin glargine (LANTUS) injection 15 Units  15 Units Subcutaneous QHS Donne Hazel, MD   15 Units at 03/29/15 2329  . LORazepam (ATIVAN) tablet 1 mg  1 mg Oral Q6H PRN Rise Patience, MD       Or  . LORazepam (ATIVAN) injection 1 mg  1 mg Intravenous Q6H PRN Rise Patience, MD      . multivitamin with minerals tablet 1 tablet  1 tablet Oral Daily Rise Patience, MD   1 tablet at 03/30/15 [id number]  . ondansetron (ZOFRAN) tablet 4 mg  4 mg Oral Q6H PRN Rise Patience, MD       Or  . ondansetron [facility]) injection 4 mg  4 mg Intravenous Q6H PRN Rise Patience, MD      . pantoprazole (PROTONIX) EC tablet 40 mg  40 mg Oral Daily Rise Patience, MD   40 mg at 03/30/15 0916  . potassium chloride (K-DUR,KLOR-CON) CR tablet 10 mEq  10 mEq Oral Daily Rise Patience, MD   10 mEq at 03/30/15 0916  . sertraline (ZOLOFT) tablet 150 mg  150 mg Oral Daily Rise Patience, MD   150 mg at 03/30/15 0916  . sodium chloride 0.9 % injection 3 mL  3 mL Intravenous Q12H Rise Patience, MD   3 mL at 03/29/15 1000  . spironolactone (ALDACTONE) tablet 50 mg  50 mg Oral Daily Rise Patience, MD   50 mg at 03/30/15 0916  . thiamine (VITAMIN B-1) tablet 100 mg  100 mg Oral Daily Rise Patience, MD   100 mg at 03/30/15 [id number]   Or  . thiamine (B-1) injection 100 mg  100 mg Intravenous Daily Rise Patience, MD        Allergies as of 03/28/2015 - Review Complete 03/28/2015  Allergen Reaction Noted  . Betadine [povidone iodine] Hives 12/24/2013    Family History  Problem Relation Age of Onset  . Cancer Mother     ovarian  . Diabetes Father     Social History   Social History  . Marital Status: Married    Spouse Name: N/A  . Number of Children: 3  .  Years of Education: N/A   Occupational History  . formed [employer]     now staying home   Social History Main Topics  . Smoking status: Never Smoker   .  Smokeless tobacco: Never Used  . Alcohol Use: Yes     Comment: one pint vodka daily years  . Drug Use: No     Comment: denied all drugs during admission  . Sexual Activity: Not Currently    Birth Control/ Protection: None   Other Topics Concern  . Not on file   Social History Narrative    Review of Systems: Pertinent positive and negative review of systems were noted in the above HPI section.  All other review of systems was otherwise negative.Marland Kitchen  Physical Exam: Vital signs in last 24 hours: Temp:  [98.1 F (36.7 C)-98.6 F (37 C)] 98.1 F (36.7 C) (12/08 [id number]) Pulse Rate:  [80-106] 80 (12/08 0614) Resp:  [18-20] 18 (12/08 0614) BP: (110-123)/(72-88) 112/72 mmHg (12/08 0614) SpO2:  [97 %-100 %] 99 % (12/08 0614) Weight:  [112 lb 3.4 oz (50.9 kg)] 112 lb 3.4 oz (50.9 kg) (12/08 0614) Last BM Date: 03/28/15 General:   Alert,  Well-developed, thin, chronically ill appearing WF , pleasant and cooperative in NAD Head:  Normocephalic and atraumatic. Eyes:  Sclera clear, no icterus.   Conjunctiva pink. Ears:  Normal auditory acuity. Nose:  No deformity, discharge,  or lesions. Mouth:  No deformity or lesions.   Neck:  Supple; no masses or thyromegaly. Lungs:  Clear throughout to auscultation.  Decreased bases Heart:  Regular rate and rhythm; no murmurs, clicks, rubs,  or gallops. Abdomen:  Diffusely tender, tense ascites, no rebound, no palp mass. BS somewhat high pitched and hyperactive Rectal:  Deferred  Msk:  Symmetrical without gross deformities. . Pulses:  Normal pulses noted. Extremities:  Without clubbing or edema. Neurologic:  Alert and  oriented x4;  grossly normal neurologically. Skin:  Intact without significant lesions or rashes.. Psych:  Alert and cooperative. Normal mood and affect.  Intake/Output from previous day: 12/07 0701 - 12/08 0700 In: 170 [P.O.:120; IV Piggyback:50] Out: 2300 [Urine:2300] Intake/Output this shift: Total I/O In: 240 [P.O.:240] Out:  650 [Urine:650]  Lab Results:  Recent Labs  03/28/15 2330 03/29/15 0518  WBC 3.1* 3.3*  HGB 14.6 12.9  HCT 41.9 39.2  PLT 88* 72*   BMET  Recent Labs  03/28/15 2330 03/29/15 0518 03/30/15 0432  NA 139 134* 134*  K 3.7 3.5 3.8  CL 101 100* 96*  CO2 26 24 31   GLUCOSE 351* 336* 260*  BUN 6 7 11   CREATININE 0.40* 0.37* 0.42*  CALCIUM 9.1 8.3* 8.1*   LFT  Recent Labs  03/30/15 0432  PROT 6.4*  ALBUMIN 2.5*  AST 81*  ALT 57*  ALKPHOS 145*  BILITOT 1.5*   PT/INR  Recent Labs  03/28/15 2330  LABPROT 15.1  INR 1.17      MPRESSION:   #1 49 yo female alcoholic with hep C with decompensated cirrhosis, presents with progressive ascites over past few months, increased abdominal pain, and weight loss Now with increased pain, and distention within 24 hours of large volume paracentesis yesterday. No SBP.  MELD=10 R/O PV thrombus, hepatoma, complication post paracentesis/bleed #2 active etoh abuse #3 ETOH hepatitis #4 hx major depression #5 hx colon polyps #6 hx gastric ulcers 2015 #7 AODM #8 gallstones   PLAN:  #1 CBC, BMET now #2 AFP #3 CT abdomen /pelvis this afternoon Further plans  pending results of CT regarding diuretics, repeat paracentesis, etc.   [name]  03/30/2015, 12:42 PM    Attending physician's note   I have taken a history, examined the patient and reviewed the chart. I agree with the Advanced Practitioner's note, impression and recommendations. Cirrhosis from etoh and Hep C with worsening ascites and weight loss over the past few months. Paracentesis yesterday with 3.7 liters removed yesterday and today has increased generalized abdominal pain and distention. No evidence of SBP yesterday. R/O hematoma, ileus, SBO, hepatoma, PV thrombosis, etc. CBC, BMET, AFP and abd/pelvic CT today. Further recommendations to follow.   Lucio Edward, MD Marval Regal [phone] Mon-Fri 8a-5p [phone] after 5p, weekends, holidays

## 2015-03-30 NOTE — Progress Notes (Signed)
[facility] PROGRESS NOTE  [name] [id number] DOB: 02/15/1966 DOA: 03/28/2015 PCP: Cathlean Cower, MD  HPI/Brief narrative 49 y.o. 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 Patient was noted to have increased ascites and was mildly hypoxic. The patient was admitted for further work up.  Assessment/Plan: 1. Ascites with decompensated cirrhosis of the liver  1. patient has been continued on Lasix and spironolactone.  2. Pt now s/p ultrasound-guided paracentesis on 12/7 yielding 3.7L fluid 3. Pt initially had been continued on empiric ceftriaxone, have d/c'd abx as fluid analysis has ruled out SBP 4. Abd has become more distended post-large volume paracentesis. 5. Consulted GI for assistance. Follow up abd CT before determining further plan of care 6. Also follow up AFP 2. Abdominal discomfort most likely from ascites 1. Pain likely secondary to marked ascites 2. Given history of gallstones, HIDA scan was done - unremarkable. 3. Diabetes mellitus type 2 uncontrolled  1. Cont to follow CBGs with sliding scale coverage. 2. Glucose noted to be in the 300's 3. Have re-ordered 5 units aspart pre-meal 4. Will increase lantus to 18 units per Diabetic Coordinator 4. Hypoxia and sinus tachycardia  1. Suspect from ascites and discomfort.  5. Chronic thrombocytopenia  1. probably from cirrhosis and alcoholism 2. Cont to follow CBC closely. 6. History of alcoholism 1. Reports last ETOH intake one day prior to admit  2. Cont on CIWA protocol. 3. Cessation done 7. Elevated LFTs 1. HIDA scan unremarkable for active biliary disease 2. Suspect elevated LFT's from cirrhosis vs etoh hepatitis 3. Cont to follow LFTs closely.  Code Status: Full Family Communication: Pt in room Disposition Plan: D/c  when cleared by GI   Consultants:  GI  Procedures:  US guided paracentesis 12/7  Antibiotics: Anti-infectives    Start     Dose/Rate Route Frequency Ordered Stop   03/29/15 0600  cefTRIAXone (ROCEPHIN) 2 g in dextrose 5 % 50 mL IVPB  Status:  Discontinued     2 g 100 mL/hr over 30 Minutes Intravenous Every 24 hours 03/29/15 0459 03/30/15 0752      HPI/Subjective: Reported relief immediately after paracentesis, however notes increased distension overnight with pain  Objective: Filed Vitals:   03/29/15 1545 03/29/15 2331 03/30/15 0614 03/30/15 1301  BP: 123/88 110/85 112/72 121/91  Pulse: 106 80 80 89  Temp: 98.6 F (37 C) 98.5 F (36.9 C) 98.1 F (36.7 C) 98.3 F (36.8 C)  TempSrc: Oral Oral Oral Oral  Resp: 20 18 18 18   Height:      Weight:   50.9 kg (112 lb 3.4 oz)   SpO2: 97% 100% 99% 100%    Intake/Output Summary (Last 24 hours) at 03/30/15 1624 Last data filed at 03/30/15 1300  Gross per 24 hour  Intake    650 ml  Output   2850 ml  Net  -2200 ml   Filed Weights   03/29/15 0402 03/30/15 0614  Weight: 51.211 kg (112 lb 14.4 oz) 50.9 kg (112 lb 3.4 oz)    Exam:   General:  Awake, laying in bed, in nad  Cardiovascular: regular, s1, s2  Respiratory: normal resp effort, no wheezing  Abdomen: distended, distant BS  Musculoskeletal: perfused, no clubbing, no cyanosis  Data Reviewed: Basic Metabolic Panel:  Recent Labs Lab 03/28/15 2330 03/29/15 0518 03/30/15 0432  NA 139 134* 134*  K 3.7 3.5 3.8  CL 101 100* 96*  CO2 26 24 31   GLUCOSE 351* 336* 260*  BUN 6 7 11   CREATININE 0.40* 0.37* 0.42*  CALCIUM 9.1 8.3* 8.1*   Liver Function Tests:  Recent Labs Lab 03/28/15 2330 03/29/15 0518 03/30/15 0432  AST 112* 93* 81*  ALT 71* 62* 57*  ALKPHOS 167* 151* 145*  BILITOT 1.0 0.9 1.5*  PROT 7.6 6.6 6.4*  ALBUMIN 2.9* 2.6* 2.5*    Recent Labs Lab 03/28/15 2330  LIPASE 55*   No results for input(s): AMMONIA in the last 168  hours. CBC:  Recent Labs Lab 03/28/15 2330 03/29/15 0518  WBC 3.1* 3.3*  NEUTROABS 2.0 2.0  HGB 14.6 12.9  HCT 41.9 39.2  MCV 104.2* 107.1*  PLT 88* 72*   Cardiac Enzymes: No results for input(s): CKTOTAL, CKMB, CKMBINDEX, TROPONINI in the last 168 hours. BNP (last 3 results) No results for input(s): BNP in the last 8760 hours.  ProBNP (last 3 results) No results for input(s): PROBNP in the last 8760 hours.  CBG:  Recent Labs Lab 03/29/15 1316 03/29/15 1732 03/29/15 2147 03/30/15 0726 03/30/15 1208  GLUCAP 168* 263* 259* 208* 338*    Recent Results (from the past 240 hour(s))  Culture, body fluid-bottle     Status: None (Preliminary result)   Collection Time: 03/29/15 12:43 PM  Result Value Ref Range Status   Specimen Description FLUID PERITONEAL  Final   Special Requests NONE  Final   Culture   Final    NO GROWTH 1 DAY Performed at [facility]    Report Status PENDING  Incomplete  Gram stain     Status: None   Collection Time: 03/29/15 12:43 PM  Result Value Ref Range Status   Specimen Description FLUID PERITONEAL  Final   Special Requests NONE  Final   Gram Stain   Final    RARE WBC PRESENT,BOTH PMN AND MONONUCLEAR NO ORGANISMS SEEN Performed at [facility]    Report Status 03/29/2015 FINAL  Final     Studies: Dg Chest 2 View  03/29/2015  CLINICAL DATA:  Acute onset of shortness of breath and generalized abdominal distention. Initial encounter. EXAM: CHEST  2 VIEW COMPARISON:  Chest radiograph performed 03/18/2015 FINDINGS: The lungs are hypoexpanded. Vascular crowding is noted. Mild bibasilar opacities likely reflect atelectasis. No pleural effusion or pneumothorax is seen. The cardiomediastinal silhouette is borderline normal in size. No acute osseous abnormalities are identified. IMPRESSION: Lungs hypoexpanded. Mild bibasilar airspace opacities likely reflect atelectasis. Electronically Signed   By: Garald Balding M.D.   On: 03/29/2015  01:57   Nm Hepatobiliary Liver Func  03/29/2015  CLINICAL DATA:  Generalized abdominal pain. Nausea and vomiting for 2 weeks. Abdominal distention. Personal history of pancreatitis EXAM: NUCLEAR MEDICINE HEPATOBILIARY IMAGING TECHNIQUE: Sequential images of the abdomen were obtained out to 60 minutes following intravenous administration of radiopharmaceutical. RADIOPHARMACEUTICALS:  5.5 mCi Tc-38m  Choletec IV COMPARISON:  CT of the abdomen and pelvis with contrast 01/02/2015. FINDINGS: Normal hepatic uptake is present. The gallbladder can be seen by 20 minutes. There is excretion of radiopharmaceutical into the small bowel. IMPRESSION: Negative hepatobiliary scan with normal hepatic uptake, gallbladder concentration, and excretion. Electronically Signed   By: San Morelle M.D.   On: 03/29/2015 13:24   US Paracentesis  03/29/2015  CLINICAL DATA:  Abdominal distention secondary to recurrent ascites. Request diagnostic and therapeutic paracentesis. EXAM: ULTRASOUND GUIDED PARACENTESIS COMPARISON:  None.  PROCEDURE: An ultrasound guided paracentesis was thoroughly discussed with the patient and questions answered. The benefits, risks, alternatives and complications were also discussed. The patient understands and wishes to proceed with the procedure. Written consent was obtained. Ultrasound was performed to localize and mark an adequate pocket of fluid in the right lower quadrant of the abdomen. The area was then prepped and draped in the normal sterile fashion. 1% Lidocaine was used for local anesthesia. Under ultrasound guidance a 19 gauge Yueh catheter was introduced. Paracentesis was performed. The catheter was removed and a dressing applied. COMPLICATIONS: None immediate FINDINGS: A total of approximately 3.7 L of clear yellow fluid was removed. A fluid sample was sent for laboratory analysis. IMPRESSION: Successful ultrasound guided paracentesis yielding 3.7 L of ascites. Read by: Ascencion Dike PA-C  Electronically Signed   By: Sandi Mariscal M.D.   On: 03/29/2015 11:51    Scheduled Meds: . docusate sodium  100 mg Oral BID  . folic acid  1 mg Oral Daily  . furosemide  40 mg Oral Daily  . insulin aspart  0-9 Units Subcutaneous TID WC  . insulin aspart  5 Units Subcutaneous TID WC  . insulin glargine  15 Units Subcutaneous QHS  . iohexol  25 mL Oral Q1 Hr x 2  . multivitamin with minerals  1 tablet Oral Daily  . pantoprazole  40 mg Oral Daily  . potassium chloride  10 mEq Oral Daily  . sertraline  150 mg Oral Daily  . sodium chloride  3 mL Intravenous Q12H  . spironolactone  50 mg Oral Daily  . thiamine  100 mg Oral Daily   Or  . thiamine  100 mg Intravenous Daily   Continuous Infusions:   Principal Problem:   Alcoholic cirrhosis of liver with ascites (HCC) Active Problems:   Thrombocytopenia ([place])   Ascites   Poorly controlled type 2 diabetes mellitus ([place])   [facility], [facility] Pager [phone]. If 7PM-7AM, please contact night-coverage at [email or web address], password [facility] 03/30/2015, 4:24 PM  LOS: 1 day

## 2015-03-31 ENCOUNTER — Inpatient Hospital Stay (HOSPITAL_COMMUNITY): Payer: Medicaid Other

## 2015-03-31 ENCOUNTER — Ambulatory Visit: Payer: Self-pay | Admitting: Internal Medicine

## 2015-03-31 DIAGNOSIS — R1084 Generalized abdominal pain: Secondary | ICD-10-CM | POA: Insufficient documentation

## 2015-03-31 DIAGNOSIS — K7031 Alcoholic cirrhosis of liver with ascites: Secondary | ICD-10-CM | POA: Insufficient documentation

## 2015-03-31 LAB — COMPREHENSIVE METABOLIC PANEL
ALT: 58 U/L — AB (ref 14–54)
AST: 83 U/L — AB (ref 15–41)
Albumin: 2.6 g/dL — ABNORMAL LOW (ref 3.5–5.0)
Alkaline Phosphatase: 145 U/L — ABNORMAL HIGH (ref 38–126)
Anion gap: 4 — ABNORMAL LOW (ref 5–15)
BILIRUBIN TOTAL: 1.4 mg/dL — AB (ref 0.3–1.2)
BUN: 9 mg/dL (ref 6–20)
CALCIUM: 8.7 mg/dL — AB (ref 8.9–10.3)
CO2: 35 mmol/L — ABNORMAL HIGH (ref 22–32)
CREATININE: 0.46 mg/dL (ref 0.44–1.00)
Chloride: 94 mmol/L — ABNORMAL LOW (ref 101–111)
Glucose, Bld: 254 mg/dL — ABNORMAL HIGH (ref 65–99)
Potassium: 4.3 mmol/L (ref 3.5–5.1)
Sodium: 133 mmol/L — ABNORMAL LOW (ref 135–145)
TOTAL PROTEIN: 6.6 g/dL (ref 6.5–8.1)

## 2015-03-31 LAB — GLUCOSE, CAPILLARY
GLUCOSE-CAPILLARY: 231 mg/dL — AB (ref 65–99)
Glucose-Capillary: 176 mg/dL — ABNORMAL HIGH (ref 65–99)
Glucose-Capillary: 114 mg/dL — ABNORMAL HIGH (ref 65–99)
Glucose-Capillary: 197 mg/dL — ABNORMAL HIGH (ref 65–99)

## 2015-03-31 LAB — BODY FLUID CELL COUNT WITH DIFFERENTIAL
LYMPHS FL: 15 %
MONOCYTE-MACROPHAGE-SEROUS FLUID: 85 % (ref 50–90)
Total Nucleated Cell Count, Fluid: 208 cu mm (ref 0–1000)

## 2015-03-31 LAB — AFP TUMOR MARKER: AFP-Tumor Marker: 4.2 ng/mL (ref 0.0–8.3)

## 2015-03-31 LAB — GRAM STAIN

## 2015-03-31 MED ORDER — SPIRONOLACTONE 100 MG PO TABS
100.0000 mg | ORAL_TABLET | Freq: Every day | ORAL | Status: DC
  Administered 2015-03-31 – 2015-04-03 (×4): 100 mg via ORAL
  Filled 2015-03-31 (×4): qty 1

## 2015-03-31 MED ORDER — FUROSEMIDE 40 MG PO TABS
60.0000 mg | ORAL_TABLET | Freq: Every day | ORAL | Status: DC
  Administered 2015-03-31 – 2015-04-03 (×4): 60 mg via ORAL
  Filled 2015-03-31 (×4): qty 1

## 2015-03-31 MED ORDER — ALBUMIN HUMAN 25 % IV SOLN
50.0000 g | Freq: Once | INTRAVENOUS | Status: AC
  Administered 2015-03-31: 50 g via INTRAVENOUS
  Filled 2015-03-31: qty 200

## 2015-03-31 NOTE — Procedures (Signed)
Successful US guided paracentesis from RLQ.  Yielded 2 liters of serous colored fluid.  No immediate complications.  Pt tolerated well.   Specimen was sent for labs.  Tsosie Billing D PA-C 03/31/2015 12:33 PM

## 2015-03-31 NOTE — Progress Notes (Signed)
[facility] PROGRESS NOTE  Lashanta Gagne [id number] DOB: Nov 14, 1965 DOA: 03/28/2015 PCP: Cathlean Cower, MD  HPI/Brief narrative 49 y.o. 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 Patient was noted to have increased ascites and was mildly hypoxic. The patient was admitted for further work up.  Assessment/Plan: 1. Ascites with decompensated cirrhosis of the liver  1. patient has been continued on Lasix and spironolactone.  2. Pt now s/p ultrasound-guided paracentesis on 12/7 yielding 3.7L fluid 3. Pt initially had been continued on empiric ceftriaxone, have d/c'd abx as fluid analysis has ruled out SBP 4. Abd has become more distended post-large volume paracentesis. 5. Consulted GI for assistance. Follow up abd CT with large acites 6. Diuretic dose increase noted. Pt is planned for repeat large volume paracentesis today 2. RUQ discomfort most likely from ascites with acute rib fracture 1. Pain likely secondary to marked ascites in addition to acute R nondisplaced 7th rib fracture s/p recent fall [place] police station 2. Given history of gallstones, HIDA scan was also done - unremarkable. 3. Diabetes mellitus type 2 uncontrolled  1. Cont to follow CBGs with sliding scale coverage. 2. Glucose noted to be in the 300's 3. Continued 5 units aspart pre-meal 4. Increased lantus to 18 units per Diabetic Coordinator recs 4. Hypoxia and sinus tachycardia  1. Suspect from ascites and discomfort.  5. Chronic thrombocytopenia  1. probably from cirrhosis and alcoholism 2. Cont to follow CBC closely. 6. History of alcoholism 1. Reports last ETOH intake one day prior to admit  2. Cont on CIWA protocol. 3. Cessation done 4. No signs of withdrawal thus far 7. Elevated LFTs 1. HIDA scan unremarkable for active  biliary disease 2. Suspect elevated LFT's from cirrhosis vs etoh hepatitis 3. Cont to follow LFTs closely.  Code Status: Full Family Communication: Pt in room Disposition Plan: D/c when cleared by GI   Consultants:  GI  Procedures:  US guided paracentesis 12/7  Antibiotics: Anti-infectives    Start     Dose/Rate Route Frequency Ordered Stop   03/29/15 0600  cefTRIAXone (ROCEPHIN) 2 g in dextrose 5 % 50 mL IVPB  Status:  Discontinued     2 g 100 mL/hr over 30 Minutes Intravenous Every 24 hours 03/29/15 0459 03/30/15 0752      HPI/Subjective: Continues to report RUQ pain this AM, improved with dilaudid  Objective: Filed Vitals:   03/31/15 0519 03/31/15 0530 03/31/15 1105 03/31/15 1136  BP: 114/81  108/84 102/83  Pulse: 88     Temp: 97.8 F (36.6 C)     TempSrc: Oral     Resp: 12     Height:      Weight:  54.159 kg (119 lb 6.4 oz)    SpO2: 99%       Intake/Output Summary (Last 24 hours) at 03/31/15 1145 Last data filed at 03/31/15 0700  Gross per 24 hour  Intake    720 ml  Output   1050 ml  Net   -330 ml   Filed Weights   03/29/15 0402 03/30/15 0614 03/31/15 0530  Weight: 51.211 kg (112 lb 14.4 oz) 50.9 kg (112 lb 3.4 oz) 54.159 kg (119 lb 6.4 oz)    Exam:   General:  Awake, laying in bed, in nad  Cardiovascular: regular, s1, s2  Respiratory: normal resp effort,  no wheezing  Abdomen: distended, distant BS, ascitic  Musculoskeletal: perfused, no clubbing, no cyanosis  Data Reviewed: Basic Metabolic Panel:  Recent Labs Lab 03/28/15 2330 03/29/15 0518 03/30/15 0432 03/30/15 1650 03/31/15 0500  NA 139 134* 134* 135 133*  K 3.7 3.5 3.8 3.8 4.3  CL 101 100* 96* 94* 94*  CO2 26 24 31  36* 35*  GLUCOSE 351* 336* 260* 103* 254*  BUN 6 7 11 9 9   CREATININE 0.40* 0.37* 0.42* 0.46 0.46  CALCIUM 9.1 8.3* 8.1* 8.7* 8.7*   Liver Function Tests:  Recent Labs Lab 03/28/15 2330 03/29/15 0518 03/30/15 0432 03/31/15 0500  AST 112* 93* 81* 83*   ALT 71* 62* 57* 58*  ALKPHOS 167* 151* 145* 145*  BILITOT 1.0 0.9 1.5* 1.4*  PROT 7.6 6.6 6.4* 6.6  ALBUMIN 2.9* 2.6* 2.5* 2.6*    Recent Labs Lab 03/28/15 2330  LIPASE 55*   No results for input(s): AMMONIA in the last 168 hours. CBC:  Recent Labs Lab 03/28/15 2330 03/29/15 0518 03/30/15 1650  WBC 3.1* 3.3* 3.6*  NEUTROABS 2.0 2.0  --   HGB 14.6 12.9 14.4  HCT 41.9 39.2 42.9  MCV 104.2* 107.1* 105.4*  PLT 88* 72* 81*   Cardiac Enzymes: No results for input(s): CKTOTAL, CKMB, CKMBINDEX, TROPONINI in the last 168 hours. BNP (last 3 results) No results for input(s): BNP in the last 8760 hours.  ProBNP (last 3 results) No results for input(s): PROBNP in the last 8760 hours.  CBG:  Recent Labs Lab 03/30/15 0726 03/30/15 1208 03/30/15 1652 03/30/15 2106 03/31/15 0740  GLUCAP 208* 338* 116* 190* 231*    Recent Results (from the past 240 hour(s))  Culture, body fluid-bottle     Status: None (Preliminary result)   Collection Time: 03/29/15 12:43 PM  Result Value Ref Range Status   Specimen Description FLUID PERITONEAL  Final   Special Requests NONE  Final   Culture   Final    NO GROWTH 1 DAY Performed at [facility]    Report Status PENDING  Incomplete  Gram stain     Status: None   Collection Time: 03/29/15 12:43 PM  Result Value Ref Range Status   Specimen Description FLUID PERITONEAL  Final   Special Requests NONE  Final   Gram Stain   Final    RARE WBC PRESENT,BOTH PMN AND MONONUCLEAR NO ORGANISMS SEEN Performed at [facility]    Report Status 03/29/2015 FINAL  Final     Studies: Nm Hepatobiliary Liver Func  03/29/2015  CLINICAL DATA:  Generalized abdominal pain. Nausea and vomiting for 2 weeks. Abdominal distention. Personal history of pancreatitis EXAM: NUCLEAR MEDICINE HEPATOBILIARY IMAGING TECHNIQUE: Sequential images of the abdomen were obtained out to 60 minutes following intravenous administration of radiopharmaceutical.  RADIOPHARMACEUTICALS:  5.5 mCi Tc-29m  Choletec IV COMPARISON:  CT of the abdomen and pelvis with contrast 01/02/2015. FINDINGS: Normal hepatic uptake is present. The gallbladder can be seen by 20 minutes. There is excretion of radiopharmaceutical into the small bowel. IMPRESSION: Negative hepatobiliary scan with normal hepatic uptake, gallbladder concentration, and excretion. Electronically Signed   By: San Morelle M.D.   On: 03/29/2015 13:24   Ct Abdomen Pelvis W Contrast  03/30/2015  CLINICAL DATA:  Cirrhosis secondary to alcoholism and hepatitis-C. Acute generalized abdominal pain and increasing abdominal distension. Status post paracentesis 1 day prior. EXAM: CT ABDOMEN AND PELVIS WITH CONTRAST TECHNIQUE: Multidetector CT imaging of the abdomen and pelvis was performed using the  standard protocol following bolus administration of intravenous contrast. CONTRAST:  118mL OMNIPAQUE IOHEXOL 300 MG/ML  SOLN COMPARISON:  01/02/2015 CT abdomen/ pelvis.  11/12/2014 MRI abdomen. FINDINGS: Lower chest: Mild bibasilar scarring versus atelectasis. There are moderate paraesophageal varices. Hepatobiliary: The liver surface is diffusely markedly irregular and there is relative atrophy of the right liver lobe and relative hypertrophy of the left liver lobe and caudate lobe, in keeping with advanced cirrhosis. There patchy bandlike regions of low attenuation throughout the liver parenchyma, most prominent in the segment 8 right liver lobe, most in keeping with confluent hepatic fibrosis. No discrete liver mass is detected on this routine study. There is a calcified subcentimeter gallstone layering in the nondistended gallbladder, with no definite gallbladder wall thickening. No biliary ductal dilatation. Pancreas: Normal, with no mass or duct dilation. Spleen: Stable mild splenomegaly (craniocaudal splenic length 14.1 cm, previously 14.6 cm, not appreciably changed). No splenic mass. Adrenals/Urinary Tract: Normal  adrenals. Subcentimeter hypodense renal cortical lesion in the upper left kidney, too small to characterize, unchanged, probably a benign renal cyst. Otherwise normal kidneys, with no hydronephrosis. Normal bladder. Stomach/Bowel: Grossly normal stomach. Stable top-normal caliber proximal jejunal small bowel loops with gradual transition to normal caliber mid to distal small bowel loops. No focal small bowel caliber transition. Oral contrast progresses to the distal small bowel. No definite small bowel wall thickening. Status post appendectomy. Normal large bowel with no diverticulosis, large bowel wall thickening or pericolonic fat stranding. Vascular/Lymphatic: Normal caliber abdominal aorta. Patent portal, splenic, hepatic and renal veins. Stable small paraumbilical varices. Stable mildly enlarged 1.3 cm porta hepatis node (series 2/ image 27). No new abdominopelvic lymphadenopathy. Reproductive: Status post hysterectomy, with no abnormal findings at the vaginal cuff. No adnexal mass. Other: No pneumoperitoneum. Moderate ascites. Musculoskeletal: No aggressive appearing focal osseous lesions. There is an acute nondisplaced lateral right seventh rib fracture. Mild degenerative changes in the visualized thoracolumbar spine. Anasarca. IMPRESSION: 1. Acute nondisplaced lateral right seventh rib fracture. 2. Advanced cirrhosis. No discrete liver mass on this routine CT study . 3. Stable findings of portal hypertension, including moderate ascites, mild splenomegaly and paraumbilical and paraesophageal varices. 4. Stable mild porta hepatis lymphadenopathy, nonspecific, likely secondary to chronic liver disease. 5. Cholelithiasis, with no evidence of acute cholecystitis. 6. No evidence of bowel obstruction or acute bowel inflammation. Electronically Signed   By: Ilona Sorrel M.D.   On: 03/30/2015 18:53   US Paracentesis  03/29/2015  CLINICAL DATA:  Abdominal distention secondary to recurrent ascites. Request  diagnostic and therapeutic paracentesis. EXAM: ULTRASOUND GUIDED PARACENTESIS COMPARISON:  None. PROCEDURE: An ultrasound guided paracentesis was thoroughly discussed with the patient and questions answered. The benefits, risks, alternatives and complications were also discussed. The patient understands and wishes to proceed with the procedure. Written consent was obtained. Ultrasound was performed to localize and mark an adequate pocket of fluid in the right lower quadrant of the abdomen. The area was then prepped and draped in the normal sterile fashion. 1% Lidocaine was used for local anesthesia. Under ultrasound guidance a 19 gauge Yueh catheter was introduced. Paracentesis was performed. The catheter was removed and a dressing applied. COMPLICATIONS: None immediate FINDINGS: A total of approximately 3.7 L of clear yellow fluid was removed. A fluid sample was sent for laboratory analysis. IMPRESSION: Successful ultrasound guided paracentesis yielding 3.7 L of ascites. Read by: Ascencion Dike PA-C Electronically Signed   By: Sandi Mariscal M.D.   On: 03/29/2015 11:51    Scheduled Meds: . albumin human  50 g Intravenous Once  . docusate sodium  100 mg Oral BID  . folic acid  1 mg Oral Daily  . furosemide  60 mg Oral Daily  . insulin aspart  0-9 Units Subcutaneous TID WC  . insulin aspart  5 Units Subcutaneous TID WC  . insulin glargine  18 Units Subcutaneous QHS  . multivitamin with minerals  1 tablet Oral Daily  . pantoprazole  40 mg Oral Daily  . potassium chloride  10 mEq Oral Daily  . sertraline  150 mg Oral Daily  . sodium chloride  3 mL Intravenous Q12H  . spironolactone  100 mg Oral Daily  . thiamine  100 mg Oral Daily   Or  . thiamine  100 mg Intravenous Daily   Continuous Infusions:   Principal Problem:   Alcoholic cirrhosis of liver with ascites (HCC) Active Problems:   Thrombocytopenia ([place])   Ascites   Poorly controlled type 2 diabetes mellitus ([place])   [facility], [place]  [facility] Pager [phone]. If 7PM-7AM, please contact night-coverage at [email or web address], password [facility] 03/31/2015, 11:45 AM  LOS: 2 days

## 2015-03-31 NOTE — Progress Notes (Signed)
Patient ID: [name], female   DOB: [birth year], 49 y.o.   MRN: [id number]    Progress Note   Subjective  Feels about the same- abdomen very tight, uncomfortable CT 12/8- no evidence of bleed post paracentesis, or ileus etc, she does have a right 7th rib fracture, large amount ascites  Pt says she fell recently at [place]   Objective   Vital signs in last 24 hours: Temp:  [97.8 F (36.6 C)-98.3 F (36.8 C)] 97.8 F (36.6 C) (12/09 0519) Pulse Rate:  [87-89] 88 (12/09 0519) Resp:  [12-18] 12 (12/09 0519) BP: (114-121)/(78-91) 114/81 mmHg (12/09 0519) SpO2:  [98 %-100 %] 99 % (12/09 0519) Weight:  [119 lb 6.4 oz (54.159 kg)] 119 lb 6.4 oz (54.159 kg) (12/09 0530) Last BM Date: 03/29/15 General:    white female in NAD, chronically ill appearing Heart:  Regular rate and rhythm; no murmurs Lungs: Respirations even and unlabored, lungs CTA bilaterally Abdomen:  Very tense ascites , diffusely tender . Normal bowel sounds. Extremities:  Without edema. Neurologic:  Alert and oriented,  grossly normal neurologically. Psych:  Cooperative. Normal mood and affect.  Intake/Output from previous day: 12/08 0701 - 12/09 0700 In: 960 [P.O.:960] Out: 1700 [Urine:1700] Intake/Output this shift:    Lab Results:  Recent Labs  03/28/15 2330 03/29/15 0518 03/30/15 1650  WBC 3.1* 3.3* 3.6*  HGB 14.6 12.9 14.4  HCT 41.9 39.2 42.9  PLT 88* 72* 81*   BMET  Recent Labs  03/30/15 0432 03/30/15 1650 03/31/15 0500  NA 134* 135 133*  K 3.8 3.8 4.3  CL 96* 94* 94*  CO2 31 36* 35*  GLUCOSE 260* 103* 254*  BUN 11 9 9   CREATININE 0.42* 0.46 0.46  CALCIUM 8.1* 8.7* 8.7*   LFT  Recent Labs  03/31/15 0500  PROT 6.6  ALBUMIN 2.6*  AST 83*  ALT 58*  ALKPHOS 145*  BILITOT 1.4*   PT/INR  Recent Labs  03/28/15 2330  LABPROT 15.1  INR 1.17    Studies/Results: Nm Hepatobiliary Liver Func  03/29/2015  CLINICAL DATA:  Generalized abdominal pain. Nausea and vomiting  for 2 weeks. Abdominal distention. Personal history of pancreatitis EXAM: NUCLEAR MEDICINE HEPATOBILIARY IMAGING TECHNIQUE: Sequential images of the abdomen were obtained out to 60 minutes following intravenous administration of radiopharmaceutical. RADIOPHARMACEUTICALS:  5.5 mCi Tc-79m  Choletec IV COMPARISON:  CT of the abdomen and pelvis with contrast 01/02/2015. FINDINGS: Normal hepatic uptake is present. The gallbladder can be seen by 20 minutes. There is excretion of radiopharmaceutical into the small bowel. IMPRESSION: Negative hepatobiliary scan with normal hepatic uptake, gallbladder concentration, and excretion. Electronically Signed   By: San Morelle M.D.   On: 03/29/2015 13:24   Ct Abdomen Pelvis W Contrast  03/30/2015  CLINICAL DATA:  Cirrhosis secondary to alcoholism and hepatitis-C. Acute generalized abdominal pain and increasing abdominal distension. Status post paracentesis 1 day prior. EXAM: CT ABDOMEN AND PELVIS WITH CONTRAST TECHNIQUE: Multidetector CT imaging of the abdomen and pelvis was performed using the standard protocol following bolus administration of intravenous contrast. CONTRAST:  127mL OMNIPAQUE IOHEXOL 300 MG/ML  SOLN COMPARISON:  01/02/2015 CT abdomen/ pelvis.  11/12/2014 MRI abdomen. FINDINGS: Lower chest: Mild bibasilar scarring versus atelectasis. There are moderate paraesophageal varices. Hepatobiliary: The liver surface is diffusely markedly irregular and there is relative atrophy of the right liver lobe and relative hypertrophy of the left liver lobe and caudate lobe, in keeping with advanced cirrhosis. There patchy bandlike regions of low attenuation throughout  the liver parenchyma, most prominent in the segment 8 right liver lobe, most in keeping with confluent hepatic fibrosis. No discrete liver mass is detected on this routine study. There is a calcified subcentimeter gallstone layering in the nondistended gallbladder, with no definite gallbladder wall  thickening. No biliary ductal dilatation. Pancreas: Normal, with no mass or duct dilation. Spleen: Stable mild splenomegaly (craniocaudal splenic length 14.1 cm, previously 14.6 cm, not appreciably changed). No splenic mass. Adrenals/Urinary Tract: Normal adrenals. Subcentimeter hypodense renal cortical lesion in the upper left kidney, too small to characterize, unchanged, probably a benign renal cyst. Otherwise normal kidneys, with no hydronephrosis. Normal bladder. Stomach/Bowel: Grossly normal stomach. Stable top-normal caliber proximal jejunal small bowel loops with gradual transition to normal caliber mid to distal small bowel loops. No focal small bowel caliber transition. Oral contrast progresses to the distal small bowel. No definite small bowel wall thickening. Status post appendectomy. Normal large bowel with no diverticulosis, large bowel wall thickening or pericolonic fat stranding. Vascular/Lymphatic: Normal caliber abdominal aorta. Patent portal, splenic, hepatic and renal veins. Stable small paraumbilical varices. Stable mildly enlarged 1.3 cm porta hepatis node (series 2/ image 27). No new abdominopelvic lymphadenopathy. Reproductive: Status post hysterectomy, with no abnormal findings at the vaginal cuff. No adnexal mass. Other: No pneumoperitoneum. Moderate ascites. Musculoskeletal: No aggressive appearing focal osseous lesions. There is an acute nondisplaced lateral right seventh rib fracture. Mild degenerative changes in the visualized thoracolumbar spine. Anasarca. IMPRESSION: 1. Acute nondisplaced lateral right seventh rib fracture. 2. Advanced cirrhosis. No discrete liver mass on this routine CT study . 3. Stable findings of portal hypertension, including moderate ascites, mild splenomegaly and paraumbilical and paraesophageal varices. 4. Stable mild porta hepatis lymphadenopathy, nonspecific, likely secondary to chronic liver disease. 5. Cholelithiasis, with no evidence of acute  cholecystitis. 6. No evidence of bowel obstruction or acute bowel inflammation. Electronically Signed   By: Ilona Sorrel M.D.   On: 03/30/2015 18:53   US Paracentesis  03/29/2015  CLINICAL DATA:  Abdominal distention secondary to recurrent ascites. Request diagnostic and therapeutic paracentesis. EXAM: ULTRASOUND GUIDED PARACENTESIS COMPARISON:  None. PROCEDURE: An ultrasound guided paracentesis was thoroughly discussed with the patient and questions answered. The benefits, risks, alternatives and complications were also discussed. The patient understands and wishes to proceed with the procedure. Written consent was obtained. Ultrasound was performed to localize and mark an adequate pocket of fluid in the right lower quadrant of the abdomen. The area was then prepped and draped in the normal sterile fashion. 1% Lidocaine was used for local anesthesia. Under ultrasound guidance a 19 gauge Yueh catheter was introduced. Paracentesis was performed. The catheter was removed and a dressing applied. COMPLICATIONS: None immediate FINDINGS: A total of approximately 3.7 L of clear yellow fluid was removed. A fluid sample was sent for laboratory analysis. IMPRESSION: Successful ultrasound guided paracentesis yielding 3.7 L of ascites. Read by: Ascencion Dike PA-C Electronically Signed   By: Sandi Mariscal M.D.   On: 03/29/2015 11:51       Assessment / Plan:    #1 49 yo female alcoholic with decompensated cirrhosis and progressive ascites Re-accumulation post paracentesis 12/6  CT shows no complication post paracentesis Will schedule for repeat large volume paracentesis today/with albumin Increase lasix and aldactone doses Monitor renal function 2 gram sodium diet  #2 acute right 7th rib fracture #3 Hep C #4 major depression -prior suicide attempts #5 ETOH withdrawal  Principal Problem:   Alcoholic cirrhosis of liver with ascites ([place]) Active Problems:  Thrombocytopenia ([place])   Ascites   Poorly  controlled type 2 diabetes mellitus ([place])     LOS: 2 days   Amy Esterwood  03/31/2015, 9:01 AM     Attending physician's note   I have taken an interval history, reviewed the chart and examined the patient. I agree with the Advanced Practitioner's note, impression and recommendations. Repeat large volume paracentesis with albumin for refractory ascites. Increase diuretic dosing.    Lucio Edward, MD [facility] [phone] Mon-Fri 8a-5p [phone] after 5p, weekends, holidays

## 2015-04-01 DIAGNOSIS — D696 Thrombocytopenia, unspecified: Secondary | ICD-10-CM

## 2015-04-01 LAB — COMPREHENSIVE METABOLIC PANEL
ALBUMIN: 2.9 g/dL — AB (ref 3.5–5.0)
ALK PHOS: 124 U/L (ref 38–126)
ALT: 49 U/L (ref 14–54)
ANION GAP: 3 — AB (ref 5–15)
AST: 71 U/L — ABNORMAL HIGH (ref 15–41)
BILIRUBIN TOTAL: 1.3 mg/dL — AB (ref 0.3–1.2)
BUN: 10 mg/dL (ref 6–20)
CALCIUM: 8.4 mg/dL — AB (ref 8.9–10.3)
CO2: 31 mmol/L (ref 22–32)
Chloride: 98 mmol/L — ABNORMAL LOW (ref 101–111)
Creatinine, Ser: 0.38 mg/dL — ABNORMAL LOW (ref 0.44–1.00)
GFR calc Af Amer: >60 mL/min (ref >60)
GLUCOSE: 200 mg/dL — AB (ref 65–99)
Potassium: 3.4 mmol/L — ABNORMAL LOW (ref 3.5–5.1)
Sodium: 132 mmol/L — ABNORMAL LOW (ref 135–145)
TOTAL PROTEIN: 6.1 g/dL — AB (ref 6.5–8.1)

## 2015-04-01 LAB — GLUCOSE, CAPILLARY
GLUCOSE-CAPILLARY: 147 mg/dL — AB (ref 65–99)
GLUCOSE-CAPILLARY: 175 mg/dL — AB (ref 65–99)
Glucose-Capillary: 129 mg/dL — ABNORMAL HIGH (ref 65–99)
Glucose-Capillary: 135 mg/dL — ABNORMAL HIGH (ref 65–99)

## 2015-04-01 MED ORDER — POTASSIUM CHLORIDE CRYS ER 20 MEQ PO TBCR
40.0000 meq | EXTENDED_RELEASE_TABLET | Freq: Once | ORAL | Status: AC
  Administered 2015-04-01: 40 meq via ORAL
  Filled 2015-04-01: qty 2

## 2015-04-01 MED ORDER — POTASSIUM CHLORIDE CRYS ER 10 MEQ PO TBCR
10.0000 meq | EXTENDED_RELEASE_TABLET | Freq: Every day | ORAL | Status: DC
  Administered 2015-04-02 – 2015-04-03 (×2): 10 meq via ORAL
  Filled 2015-04-01 (×2): qty 1

## 2015-04-01 NOTE — Progress Notes (Signed)
[facility] PROGRESS NOTE  Sienna Bartoszek [id number] DOB: [birth year] DOA: 03/28/2015 PCP: Cathlean Cower, MD  HPI/Brief narrative 49 y.o. 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 Patient was noted to have increased ascites and was mildly hypoxic. The patient was admitted for further work up.  Assessment/Plan: 1. Ascites with decompensated cirrhosis of the liver  1. Patient has been continued on Lasix and spironolactone.  2. Pt s/p ultrasound-guided paracentesis on 12/7 yielding 3.7L fluid with repeat paracentesis on 12/9 yielding 2L fluid 3. GI is following 4. Diuretic dose increase recently and is continued. 2. RUQ discomfort most likely from ascites with acute rib fracture 1. Pain likely secondary to marked ascites in addition to acute R nondisplaced 7th rib fracture s/p [place] 2. Given history of gallstones, HIDA scan was also done - unremarkable. 3. Diabetes mellitus type 2 uncontrolled  1. Cont to follow CBGs with sliding scale coverage. 2. Glucose noted to be in the 300's 3. Continued 5 units aspart pre-meal 4. Recently increased lantus to 18 units per Diabetic Coordinator recs 4. Hypoxia and sinus tachycardia  1. Suspect from ascites and discomfort.  5. Chronic thrombocytopenia  1. probably from cirrhosis and alcoholism 2. Cont to follow CBC closely. 6. History of alcoholism 1. Reports last ETOH intake one day prior to admit  2. Cont on CIWA protocol. 3. Cessation done 4. No signs of withdrawal thus far 7. Elevated LFTs 1. HIDA scan unremarkable for active biliary disease 2. Suspect elevated LFT's from cirrhosis vs etoh hepatitis 3. Cont to follow LFTs closely.  Code Status: Full Family Communication: Pt in room Disposition Plan: D/c when cleared by  GI   Consultants:  GI  Procedures:  US guided paracentesis 12/7  Antibiotics: Anti-infectives    Start     Dose/Rate Route Frequency Ordered Stop   03/29/15 0600  cefTRIAXone (ROCEPHIN) 2 g in dextrose 5 % 50 mL IVPB  Status:  Discontinued     2 g 100 mL/hr over 30 Minutes Intravenous Every 24 hours 03/29/15 0459 03/30/15 0752      HPI/Subjective: Reports pain is better controlled with dilaudid  Objective: Filed Vitals:   03/31/15 1136 03/31/15 1442 03/31/15 2042 04/01/15 0454  BP: 102/83 108/81 103/74 99/74  Pulse:  81 90 79  Temp:  98.2 F (36.8 C) 98.3 F (36.8 C) 98.7 F (37.1 C)  TempSrc:  Oral Oral Oral  Resp:  14 14 16   Height:      Weight:    50.259 kg (110 lb 12.8 oz)  SpO2:  99% 99% 96%    Intake/Output Summary (Last 24 hours) at 04/01/15 0915 Last data filed at 03/31/15 1620  Gross per 24 hour  Intake    200 ml  Output      0 ml  Net    200 ml   Filed Weights   03/30/15 0614 03/31/15 0530 04/01/15 0454  Weight: 50.9 kg (112 lb 3.4 oz) 54.159 kg (119 lb 6.4 oz) 50.259 kg (110 lb 12.8 oz)    Exam:   General:  Awake, sitting in bed, in nad  Cardiovascular: regular, s1, s2  Respiratory: normal resp effort, no wheezing  Abdomen: less distended, distant BS, ascitic  Musculoskeletal: perfused, no clubbing, no cyanosis  Data Reviewed: Basic Metabolic Panel:  Recent Labs Lab 03/29/15  0518 03/30/15 0432 03/30/15 1650 03/31/15 0500 04/01/15 0507  NA 134* 134* 135 133* 132*  K 3.5 3.8 3.8 4.3 3.4*  CL 100* 96* 94* 94* 98*  CO2 24 31 36* 35* 31  GLUCOSE 336* 260* 103* 254* 200*  BUN 7 11 9 9 10   CREATININE 0.37* 0.42* 0.46 0.46 0.38*  CALCIUM 8.3* 8.1* 8.7* 8.7* 8.4*   Liver Function Tests:  Recent Labs Lab 03/28/15 2330 03/29/15 0518 03/30/15 0432 03/31/15 0500 04/01/15 0507  AST 112* 93* 81* 83* 71*  ALT 71* 62* 57* 58* 49  ALKPHOS 167* 151* 145* 145* 124  BILITOT 1.0 0.9 1.5* 1.4* 1.3*  PROT 7.6 6.6 6.4* 6.6 6.1*   ALBUMIN 2.9* 2.6* 2.5* 2.6* 2.9*    Recent Labs Lab 03/28/15 2330  LIPASE 55*   No results for input(s): AMMONIA in the last 168 hours. CBC:  Recent Labs Lab 03/28/15 2330 03/29/15 0518 03/30/15 1650  WBC 3.1* 3.3* 3.6*  NEUTROABS 2.0 2.0  --   HGB 14.6 12.9 14.4  HCT 41.9 39.2 42.9  MCV 104.2* 107.1* 105.4*  PLT 88* 72* 81*   Cardiac Enzymes: No results for input(s): CKTOTAL, CKMB, CKMBINDEX, TROPONINI in the last 168 hours. BNP (last 3 results) No results for input(s): BNP in the last 8760 hours.  ProBNP (last 3 results) No results for input(s): PROBNP in the last 8760 hours.  CBG:  Recent Labs Lab 03/31/15 0740 03/31/15 1132 03/31/15 1711 03/31/15 2038 04/01/15 0729  GLUCAP 231* 176* 114* 197* 129*    Recent Results (from the past 240 hour(s))  Culture, body fluid-bottle     Status: None (Preliminary result)   Collection Time: 03/29/15 12:43 PM  Result Value Ref Range Status   Specimen Description FLUID PERITONEAL  Final   Special Requests NONE  Final   Culture   Final    NO GROWTH 2 DAYS Performed at [facility]    Report Status PENDING  Incomplete  Gram stain     Status: None   Collection Time: 03/29/15 12:43 PM  Result Value Ref Range Status   Specimen Description FLUID PERITONEAL  Final   Special Requests NONE  Final   Gram Stain   Final    RARE WBC PRESENT,BOTH PMN AND MONONUCLEAR NO ORGANISMS SEEN Performed at [facility]    Report Status 03/29/2015 FINAL  Final  Gram stain     Status: None   Collection Time: 03/31/15 11:37 AM  Result Value Ref Range Status   Specimen Description FLUID ASCITIC  Final   Special Requests NONE  Final   Gram Stain   Final    RARE WBC PRESENT, PREDOMINANTLY MONONUCLEAR NO ORGANISMS SEEN Performed at [facility]    Report Status 03/31/2015 FINAL  Final     Studies: Ct Abdomen Pelvis W Contrast  03/30/2015  CLINICAL DATA:  Cirrhosis secondary to alcoholism and hepatitis-C.  Acute generalized abdominal pain and increasing abdominal distension. Status post paracentesis 1 day prior. EXAM: CT ABDOMEN AND PELVIS WITH CONTRAST TECHNIQUE: Multidetector CT imaging of the abdomen and pelvis was performed using the standard protocol following bolus administration of intravenous contrast. CONTRAST:  157mL OMNIPAQUE IOHEXOL 300 MG/ML  SOLN COMPARISON:  01/02/2015 CT abdomen/ pelvis.  11/12/2014 MRI abdomen. FINDINGS: Lower chest: Mild bibasilar scarring versus atelectasis. There are moderate paraesophageal varices. Hepatobiliary: The liver surface is diffusely markedly irregular and there is relative atrophy of the right liver lobe and relative hypertrophy of the left liver lobe  and caudate lobe, in keeping with advanced cirrhosis. There patchy bandlike regions of low attenuation throughout the liver parenchyma, most prominent in the segment 8 right liver lobe, most in keeping with confluent hepatic fibrosis. No discrete liver mass is detected on this routine study. There is a calcified subcentimeter gallstone layering in the nondistended gallbladder, with no definite gallbladder wall thickening. No biliary ductal dilatation. Pancreas: Normal, with no mass or duct dilation. Spleen: Stable mild splenomegaly (craniocaudal splenic length 14.1 cm, previously 14.6 cm, not appreciably changed). No splenic mass. Adrenals/Urinary Tract: Normal adrenals. Subcentimeter hypodense renal cortical lesion in the upper left kidney, too small to characterize, unchanged, probably a benign renal cyst. Otherwise normal kidneys, with no hydronephrosis. Normal bladder. Stomach/Bowel: Grossly normal stomach. Stable top-normal caliber proximal jejunal small bowel loops with gradual transition to normal caliber mid to distal small bowel loops. No focal small bowel caliber transition. Oral contrast progresses to the distal small bowel. No definite small bowel wall thickening. Status post appendectomy. Normal large bowel  with no diverticulosis, large bowel wall thickening or pericolonic fat stranding. Vascular/Lymphatic: Normal caliber abdominal aorta. Patent portal, splenic, hepatic and renal veins. Stable small paraumbilical varices. Stable mildly enlarged 1.3 cm porta hepatis node (series 2/ image 27). No new abdominopelvic lymphadenopathy. Reproductive: Status post hysterectomy, with no abnormal findings at the vaginal cuff. No adnexal mass. Other: No pneumoperitoneum. Moderate ascites. Musculoskeletal: No aggressive appearing focal osseous lesions. There is an acute nondisplaced lateral right seventh rib fracture. Mild degenerative changes in the visualized thoracolumbar spine. Anasarca. IMPRESSION: 1. Acute nondisplaced lateral right seventh rib fracture. 2. Advanced cirrhosis. No discrete liver mass on this routine CT study . 3. Stable findings of portal hypertension, including moderate ascites, mild splenomegaly and paraumbilical and paraesophageal varices. 4. Stable mild porta hepatis lymphadenopathy, nonspecific, likely secondary to chronic liver disease. 5. Cholelithiasis, with no evidence of acute cholecystitis. 6. No evidence of bowel obstruction or acute bowel inflammation. Electronically Signed   By: Ilona Sorrel M.D.   On: 03/30/2015 18:53   US Paracentesis  03/31/2015  INDICATION: Hepatitis-C, cirrhosis, recurrent ascites. Request is made for diagnostic and therapeutic paracentesis. EXAM: ULTRASOUND-GUIDED PARACENTESIS COMPARISON:  Paracentesis 03/29/15. MEDICATIONS: None. COMPLICATIONS: None immediate TECHNIQUE: Informed written consent was obtained from the patient after a discussion of the risks, benefits and alternatives to treatment. A timeout was performed prior to the initiation of the procedure. Initial ultrasound scanning demonstrates a moderate amount of ascites within the right lower abdominal quadrant. The right lower abdomen was prepped and draped in the usual sterile fashion. 1% lidocaine was used  for local anesthesia. Under direct ultrasound guidance, a 19 gauge, 7-cm, Yueh catheter was introduced. An ultrasound image was saved for documentation purposed. The paracentesis was performed. The catheter was removed and a dressing was applied. The patient tolerated the procedure well without immediate post procedural complication. FINDINGS: A total of approximately 2 liters of serous fluid was removed. Samples were sent to the laboratory as requested by the clinical team. IMPRESSION: Successful ultrasound-guided paracentesis yielding 2 liters of peritoneal fluid. Read By:  Tsosie Billing PA-C Electronically Signed   By: Corrie Mckusick D.O.   On: 03/31/2015 14:57    Scheduled Meds: . docusate sodium  100 mg Oral BID  . folic acid  1 mg Oral Daily  . furosemide  60 mg Oral Daily  . insulin aspart  0-9 Units Subcutaneous TID WC  . insulin aspart  5 Units Subcutaneous TID WC  . insulin glargine  18  Units Subcutaneous QHS  . multivitamin with minerals  1 tablet Oral Daily  . pantoprazole  40 mg Oral Daily  . [START ON 04/02/2015] potassium chloride  10 mEq Oral Daily  . sertraline  150 mg Oral Daily  . sodium chloride  3 mL Intravenous Q12H  . spironolactone  100 mg Oral Daily  . thiamine  100 mg Oral Daily   Or  . thiamine  100 mg Intravenous Daily   Continuous Infusions:   Principal Problem:   Alcoholic cirrhosis of liver with ascites ([facility]) Active Problems:   Thrombocytopenia ([place])   Ascites   Poorly controlled type 2 diabetes mellitus ([place])   Acute generalized abdominal pain   Ascites due to alcoholic cirrhosis ([place])   Kimela Malstrom, [facility] Pager [phone]. If 7PM-7AM, please contact night-coverage at [email or web address], password [facility] 04/01/2015, 9:15 AM  LOS: 3 days

## 2015-04-01 NOTE — Progress Notes (Signed)
Progress Note   Subjective  Abdominal distention persists but is improved. Right chest pain persists. Increased urine output noted.    Objective  Vital signs in last 24 hours: Temp:  [98.2 F (36.8 C)-98.7 F (37.1 C)] 98.7 F (37.1 C) (12/10 0454) Pulse Rate:  [79-90] 79 (12/10 0454) Resp:  [14-16] 16 (12/10 0454) BP: (99-108)/(74-84) 99/74 mmHg (12/10 0454) SpO2:  [96 %-99 %] 96 % (12/10 0454) Weight:  [110 lb 12.8 oz (50.259 kg)] 110 lb 12.8 oz (50.259 kg) (12/10 0454) Last BM Date: 03/31/15  General: Alert, well-developed, in NAD Heart:  Regular rate and rhythm; no murmurs Chest: Clear to ascultation bilaterally Abdomen:  Soft, nontender. Distended but softer than past two days. Normal bowel sounds, without guarding, and without rebound.   Extremities:  Without edema. Neurologic:  Alert and  oriented x4; grossly normal neurologically. Psych:  Alert and cooperative. Normal mood and affect.  Intake/Output from previous day: 12/09 0701 - 12/10 0700 In: 200 [IV Piggyback:200] Out: -  Intake/Output this shift:    Lab Results:  Recent Labs  03/30/15 1650  WBC 3.6*  HGB 14.4  HCT 42.9  PLT 81*   BMET  Recent Labs  03/30/15 1650 03/31/15 0500 04/01/15 0507  NA 135 133* 132*  K 3.8 4.3 3.4*  CL 94* 94* 98*  CO2 36* 35* 31  GLUCOSE 103* 254* 200*  BUN 9 9 10   CREATININE 0.46 0.46 0.38*  CALCIUM 8.7* 8.7* 8.4*   LFT  Recent Labs  04/01/15 0507  PROT 6.1*  ALBUMIN 2.9*  AST 71*  ALT 49  ALKPHOS 124  BILITOT 1.3*    AFP=4.2    Studies/Results: Ct Abdomen Pelvis W Contrast  03/30/2015  CLINICAL DATA:  Cirrhosis secondary to alcoholism and hepatitis-C. Acute generalized abdominal pain and increasing abdominal distension. Status post paracentesis 1 day prior. EXAM: CT ABDOMEN AND PELVIS WITH CONTRAST TECHNIQUE: Multidetector CT imaging of the abdomen and pelvis was performed using the standard protocol following bolus administration of  intravenous contrast. CONTRAST:  167mL OMNIPAQUE IOHEXOL 300 MG/ML  SOLN COMPARISON:  01/02/2015 CT abdomen/ pelvis.  11/12/2014 MRI abdomen. FINDINGS: Lower chest: Mild bibasilar scarring versus atelectasis. There are moderate paraesophageal varices. Hepatobiliary: The liver surface is diffusely markedly irregular and there is relative atrophy of the right liver lobe and relative hypertrophy of the left liver lobe and caudate lobe, in keeping with advanced cirrhosis. There patchy bandlike regions of low attenuation throughout the liver parenchyma, most prominent in the segment 8 right liver lobe, most in keeping with confluent hepatic fibrosis. No discrete liver mass is detected on this routine study. There is a calcified subcentimeter gallstone layering in the nondistended gallbladder, with no definite gallbladder wall thickening. No biliary ductal dilatation. Pancreas: Normal, with no mass or duct dilation. Spleen: Stable mild splenomegaly (craniocaudal splenic length 14.1 cm, previously 14.6 cm, not appreciably changed). No splenic mass. Adrenals/Urinary Tract: Normal adrenals. Subcentimeter hypodense renal cortical lesion in the upper left kidney, too small to characterize, unchanged, probably a benign renal cyst. Otherwise normal kidneys, with no hydronephrosis. Normal bladder. Stomach/Bowel: Grossly normal stomach. Stable top-normal caliber proximal jejunal small bowel loops with gradual transition to normal caliber mid to distal small bowel loops. No focal small bowel caliber transition. Oral contrast progresses to the distal small bowel. No definite small bowel wall thickening. Status post appendectomy. Normal large bowel with no diverticulosis, large bowel wall thickening or pericolonic fat stranding. Vascular/Lymphatic: Normal caliber abdominal aorta. Patent portal,  splenic, hepatic and renal veins. Stable small paraumbilical varices. Stable mildly enlarged 1.3 cm porta hepatis node (series 2/ image 27).  No new abdominopelvic lymphadenopathy. Reproductive: Status post hysterectomy, with no abnormal findings at the vaginal cuff. No adnexal mass. Other: No pneumoperitoneum. Moderate ascites. Musculoskeletal: No aggressive appearing focal osseous lesions. There is an acute nondisplaced lateral right seventh rib fracture. Mild degenerative changes in the visualized thoracolumbar spine. Anasarca. IMPRESSION: 1. Acute nondisplaced lateral right seventh rib fracture. 2. Advanced cirrhosis. No discrete liver mass on this routine CT study . 3. Stable findings of portal hypertension, including moderate ascites, mild splenomegaly and paraumbilical and paraesophageal varices. 4. Stable mild porta hepatis lymphadenopathy, nonspecific, likely secondary to chronic liver disease. 5. Cholelithiasis, with no evidence of acute cholecystitis. 6. No evidence of bowel obstruction or acute bowel inflammation. Electronically Signed   By: Ilona Sorrel M.D.   On: 03/30/2015 18:53   US Paracentesis  03/31/2015  INDICATION: Hepatitis-C, cirrhosis, recurrent ascites. Request is made for diagnostic and therapeutic paracentesis. EXAM: ULTRASOUND-GUIDED PARACENTESIS COMPARISON:  Paracentesis 03/29/15. MEDICATIONS: None. COMPLICATIONS: None immediate TECHNIQUE: Informed written consent was obtained from the patient after a discussion of the risks, benefits and alternatives to treatment. A timeout was performed prior to the initiation of the procedure. Initial ultrasound scanning demonstrates a moderate amount of ascites within the right lower abdominal quadrant. The right lower abdomen was prepped and draped in the usual sterile fashion. 1% lidocaine was used for local anesthesia. Under direct ultrasound guidance, a 19 gauge, 7-cm, Yueh catheter was introduced. An ultrasound image was saved for documentation purposed. The paracentesis was performed. The catheter was removed and a dressing was applied. The patient tolerated the procedure well  without immediate post procedural complication. FINDINGS: A total of approximately 2 liters of serous fluid was removed. Samples were sent to the laboratory as requested by the clinical team. IMPRESSION: Successful ultrasound-guided paracentesis yielding 2 liters of peritoneal fluid. Read By:  Tsosie Billing PA-C Electronically Signed   By: Corrie Mckusick D.O.   On: 03/31/2015 14:57      Assessment & Plan   1.  Decompensated cirrhosis from etoh and Hep C with progressive ascites. Additional 2 liters of ascites removed with paracentesis yesterday in addition to 3.7 liters removed on 12/7. No evidence of SBP. Good response to increased dosage of lasix and aldactone. If diuresis continues and electrolytes are stable would be appropriate for discharge on current lasix and aldactone dosing in next 1-2 days. Continue 2g Na diet. Avoid etoh. GI signing off. 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 Nandigam, Armbruster or Danis.   2.  Right 7th rib fracture.   3.  DM, suboptimal control as outpatient. Needs close follow up with PCP.   Principal Problem:   Alcoholic cirrhosis of liver with ascites (HCC) Active Problems:   Thrombocytopenia ([place])   Ascites   Poorly controlled type 2 diabetes mellitus ([place])   Acute generalized abdominal pain   Ascites due to alcoholic cirrhosis ([place])    LOS: 3 days   Norberto Sorenson T. Fuller Plan MD 04/01/2015, 10:15 AM [id number] 8a-5p weekdays [phone] after 5p, weekends, holidays

## 2015-04-02 LAB — COMPREHENSIVE METABOLIC PANEL
ALK PHOS: 141 U/L — AB (ref 38–126)
ALT: 58 U/L — AB (ref 14–54)
AST: 84 U/L — AB (ref 15–41)
Albumin: 2.8 g/dL — ABNORMAL LOW (ref 3.5–5.0)
Anion gap: 6 (ref 5–15)
BUN: 13 mg/dL (ref 6–20)
CALCIUM: 8.9 mg/dL (ref 8.9–10.3)
CHLORIDE: 100 mmol/L — AB (ref 101–111)
CO2: 32 mmol/L (ref 22–32)
CREATININE: 0.38 mg/dL — AB (ref 0.44–1.00)
GFR calc Af Amer: >60 mL/min (ref >60)
Glucose, Bld: 187 mg/dL — ABNORMAL HIGH (ref 65–99)
Potassium: 3.9 mmol/L (ref 3.5–5.1)
Sodium: 138 mmol/L (ref 135–145)
Total Bilirubin: 1.3 mg/dL — ABNORMAL HIGH (ref 0.3–1.2)
Total Protein: 6.3 g/dL — ABNORMAL LOW (ref 6.5–8.1)

## 2015-04-02 LAB — GLUCOSE, CAPILLARY
GLUCOSE-CAPILLARY: 177 mg/dL — AB (ref 65–99)
GLUCOSE-CAPILLARY: 86 mg/dL (ref 65–99)
GLUCOSE-CAPILLARY: 293 mg/dL — AB (ref 65–99)
Glucose-Capillary: 307 mg/dL — ABNORMAL HIGH (ref 65–99)

## 2015-04-02 MED ORDER — HYDROMORPHONE HCL 1 MG/ML IJ SOLN
1.0000 mg | INTRAMUSCULAR | Status: DC | PRN
  Administered 2015-04-02 – 2015-04-03 (×6): 1 mg via INTRAVENOUS
  Filled 2015-04-02 (×6): qty 1

## 2015-04-02 NOTE — Progress Notes (Signed)
[facility] PROGRESS NOTE  [name] [id number] DOB: 06/12/1965 DOA: 03/28/2015 PCP: Cathlean Cower, MD  HPI/Brief narrative 49 y.o. 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 Patient was noted to have increased ascites and was mildly hypoxic. The patient was admitted for further work up.  Assessment/Plan: 1. Ascites with decompensated cirrhosis of the liver  1. Patient has been continued on Lasix and spironolactone, dose recently increased by GI.  2. Pt s/p ultrasound-guided paracentesis on 12/7 yielding 3.7L fluid with repeat paracentesis on 12/9 yielding 2L fluid 3. Per GI, if patient continues to have good urine output and lytes stable, potentially able to d/c in 1-2 days with close outpt follow up. 4. Pathology from ascitic fluid reveals reactive cells. No evidence of malignancy 2. RUQ discomfort most likely from ascites with acute rib fracture 1. Pain likely secondary to marked ascites in addition to acute R nondisplaced 7th rib fracture s/p recent fall at police station 2. Given history of gallstones, HIDA scan was also done - unremarkable 3. Cont analgesics as tolerated. 3. Diabetes mellitus type 2 uncontrolled  1. Cont to follow CBGs with sliding scale coverage. 2. Continued 5 units aspart pre-meal 3. Recently increased lantus to 18 units per Diabetic Coordinator recs 4. Glucose stable thus far 4. Hypoxia and sinus tachycardia  1. Suspect from ascites and discomfort.  2. Much improved/resolved 5. Chronic thrombocytopenia  1. probably from cirrhosis and alcoholism 2. Cont to follow CBC closely. 6. History of alcoholism 1. Reports last ETOH intake one day prior to admit  2. Cont on CIWA protocol. 3. Cessation was done 4. No signs of withdrawal thus far 7. Elevated LFTs 1. HIDA  scan unremarkable for active biliary disease 2. Suspect elevated LFT's from cirrhosis vs etoh hepatitis 3. Cont to follow LFTs closely.  Code Status: Full Family Communication: Pt in room Disposition Plan: D/c when cleared by GI   Consultants:  GI  Procedures:  US guided paracentesis 12/7  Antibiotics: Anti-infectives    Start     Dose/Rate Route Frequency Ordered Stop   03/29/15 0600  cefTRIAXone (ROCEPHIN) 2 g in dextrose 5 % 50 mL IVPB  Status:  Discontinued     2 g 100 mL/hr over 30 Minutes Intravenous Every 24 hours 03/29/15 0459 03/30/15 0752      HPI/Subjective: States pain better controlled with dilaudid  Objective: Filed Vitals:   04/01/15 0454 04/01/15 1409 04/02/15 0017 04/02/15 0609  BP: 99/74 96/69 120/79 117/79  Pulse: 79 88 84 84  Temp: 98.7 F (37.1 C) 98.6 F (37 C) 98.4 F (36.9 C) 98.1 F (36.7 C)  TempSrc: Oral Oral Oral Oral  Resp: 16 18 18 18   Height:      Weight: 50.259 kg (110 lb 12.8 oz)   52.4 kg (115 lb 8.3 oz)  SpO2: 96% 98% 100% 98%    Intake/Output Summary (Last 24 hours) at 04/02/15 1133 Last data filed at 04/02/15 0650  Gross per 24 hour  Intake    480 ml  Output   1100 ml  Net   -620 ml   Filed Weights   03/31/15 0530 04/01/15 0454 04/02/15 0609  Weight: 54.159 kg (119 lb 6.4 oz) 50.259 kg (110 lb 12.8 oz) 52.4 kg (115 lb 8.3 oz)    Exam:   General:  Awake, sitting in  bed, in nad  Cardiovascular: regular, s1, s2  Respiratory: normal resp effort, no wheezing  Abdomen: less distended, distant BS, ascitic, generally tender  Musculoskeletal: perfused, no clubbing, no cyanosis  Data Reviewed: Basic Metabolic Panel:  Recent Labs Lab 03/30/15 0432 03/30/15 1650 03/31/15 0500 04/01/15 0507 04/02/15 0502  NA 134* 135 133* 132* 138  K 3.8 3.8 4.3 3.4* 3.9  CL 96* 94* 94* 98* 100*  CO2 31 36* 35* 31 32  GLUCOSE 260* 103* 254* 200* 187*  BUN 11 9 9 10 13   CREATININE 0.42* 0.46 0.46 0.38* 0.38*  CALCIUM 8.1*  8.7* 8.7* 8.4* 8.9   Liver Function Tests:  Recent Labs Lab 03/29/15 0518 03/30/15 0432 03/31/15 0500 04/01/15 0507 04/02/15 0502  AST 93* 81* 83* 71* 84*  ALT 62* 57* 58* 49 58*  ALKPHOS 151* 145* 145* 124 141*  BILITOT 0.9 1.5* 1.4* 1.3* 1.3*  PROT 6.6 6.4* 6.6 6.1* 6.3*  ALBUMIN 2.6* 2.5* 2.6* 2.9* 2.8*    Recent Labs Lab 03/28/15 2330  LIPASE 55*   No results for input(s): AMMONIA in the last 168 hours. CBC:  Recent Labs Lab 03/28/15 2330 03/29/15 0518 03/30/15 1650  WBC 3.1* 3.3* 3.6*  NEUTROABS 2.0 2.0  --   HGB 14.6 12.9 14.4  HCT 41.9 39.2 42.9  MCV 104.2* 107.1* 105.4*  PLT 88* 72* 81*   Cardiac Enzymes: No results for input(s): CKTOTAL, CKMB, CKMBINDEX, TROPONINI in the last 168 hours. BNP (last 3 results) No results for input(s): BNP in the last 8760 hours.  ProBNP (last 3 results) No results for input(s): PROBNP in the last 8760 hours.  CBG:  Recent Labs Lab 04/01/15 0729 04/01/15 1129 04/01/15 1646 04/01/15 2110 04/02/15 0727  GLUCAP 129* 147* 175* 135* 177*    Recent Results (from the past 240 hour(s))  Culture, body fluid-bottle     Status: None (Preliminary result)   Collection Time: 03/29/15 12:43 PM  Result Value Ref Range Status   Specimen Description FLUID PERITONEAL  Final   Special Requests NONE  Final   Culture   Final    NO GROWTH 4 DAYS Performed at [facility]    Report Status PENDING  Incomplete  Gram stain     Status: None   Collection Time: 03/29/15 12:43 PM  Result Value Ref Range Status   Specimen Description FLUID PERITONEAL  Final   Special Requests NONE  Final   Gram Stain   Final    RARE WBC PRESENT,BOTH PMN AND MONONUCLEAR NO ORGANISMS SEEN Performed at [facility]    Report Status 03/29/2015 FINAL  Final  Culture, body fluid-bottle     Status: None (Preliminary result)   Collection Time: 03/31/15 11:37 AM  Result Value Ref Range Status   Specimen Description FLUID ASCITIC  Final    Special Requests NONE  Final   Culture   Final    NO GROWTH 2 DAYS Performed at [facility]    Report Status PENDING  Incomplete  Gram stain     Status: None   Collection Time: 03/31/15 11:37 AM  Result Value Ref Range Status   Specimen Description FLUID ASCITIC  Final   Special Requests NONE  Final   Gram Stain   Final    RARE WBC PRESENT, PREDOMINANTLY MONONUCLEAR NO ORGANISMS SEEN Performed at [facility]    Report Status 03/31/2015 FINAL  Final     Studies: US Paracentesis  03/31/2015  INDICATION: Hepatitis-C, cirrhosis, recurrent  ascites. Request is made for diagnostic and therapeutic paracentesis. EXAM: ULTRASOUND-GUIDED PARACENTESIS COMPARISON:  Paracentesis 03/29/15. MEDICATIONS: None. COMPLICATIONS: None immediate TECHNIQUE: Informed written consent was obtained from the patient after a discussion of the risks, benefits and alternatives to treatment. A timeout was performed prior to the initiation of the procedure. Initial ultrasound scanning demonstrates a moderate amount of ascites within the right lower abdominal quadrant. The right lower abdomen was prepped and draped in the usual sterile fashion. 1% lidocaine was used for local anesthesia. Under direct ultrasound guidance, a 19 gauge, 7-cm, Yueh catheter was introduced. An ultrasound image was saved for documentation purposed. The paracentesis was performed. The catheter was removed and a dressing was applied. The patient tolerated the procedure well without immediate post procedural complication. FINDINGS: A total of approximately 2 liters of serous fluid was removed. Samples were sent to the laboratory as requested by the clinical team. IMPRESSION: Successful ultrasound-guided paracentesis yielding 2 liters of peritoneal fluid. Read By:  Tsosie Billing PA-C Electronically Signed   By: Corrie Mckusick D.O.   On: 03/31/2015 14:57    Scheduled Meds: . docusate sodium  100 mg Oral BID  . folic acid  1 mg Oral Daily   . furosemide  60 mg Oral Daily  . insulin aspart  0-9 Units Subcutaneous TID WC  . insulin aspart  5 Units Subcutaneous TID WC  . insulin glargine  18 Units Subcutaneous QHS  . multivitamin with minerals  1 tablet Oral Daily  . pantoprazole  40 mg Oral Daily  . potassium chloride  10 mEq Oral Daily  . sertraline  150 mg Oral Daily  . sodium chloride  3 mL Intravenous Q12H  . spironolactone  100 mg Oral Daily  . thiamine  100 mg Oral Daily   Or  . thiamine  100 mg Intravenous Daily   Continuous Infusions:   Principal Problem:   Alcoholic cirrhosis of liver with ascites (HCC) Active Problems:   Thrombocytopenia ([place])   Ascites   Poorly controlled type 2 diabetes mellitus ([place])   Acute generalized abdominal pain   Ascites due to alcoholic cirrhosis ([place])   [facility] Pager [phone]. If 7PM-7AM, please contact night-coverage at [email or web address], password [facility] 04/02/2015, 11:33 AM  LOS: 4 days

## 2015-04-03 LAB — COMPREHENSIVE METABOLIC PANEL
ALBUMIN: 2.8 g/dL — AB (ref 3.5–5.0)
ALK PHOS: 155 U/L — AB (ref 38–126)
ALT: 61 U/L — AB (ref 14–54)
ANION GAP: 6 (ref 5–15)
AST: 81 U/L — ABNORMAL HIGH (ref 15–41)
BILIRUBIN TOTAL: 1.3 mg/dL — AB (ref 0.3–1.2)
BUN: 11 mg/dL (ref 6–20)
CALCIUM: 8.4 mg/dL — AB (ref 8.9–10.3)
CO2: 31 mmol/L (ref 22–32)
Chloride: 94 mmol/L — ABNORMAL LOW (ref 101–111)
Creatinine, Ser: 0.46 mg/dL (ref 0.44–1.00)
GFR calc Af Amer: >60 mL/min (ref >60)
GFR calc non Af Amer: >60 mL/min (ref >60)
Glucose, Bld: 294 mg/dL — ABNORMAL HIGH (ref 65–99)
Potassium: 3.7 mmol/L (ref 3.5–5.1)
SODIUM: 131 mmol/L — AB (ref 135–145)
TOTAL PROTEIN: 6.4 g/dL — AB (ref 6.5–8.1)

## 2015-04-03 LAB — CULTURE, BODY FLUID-BOTTLE: CULTURE: NO GROWTH

## 2015-04-03 LAB — GLUCOSE, CAPILLARY
GLUCOSE-CAPILLARY: 173 mg/dL — AB (ref 65–99)
Glucose-Capillary: 183 mg/dL — ABNORMAL HIGH (ref 65–99)

## 2015-04-03 LAB — PATHOLOGIST SMEAR REVIEW

## 2015-04-03 MED ORDER — FUROSEMIDE 20 MG PO TABS: 60.0000 mg | ORAL_TABLET | Freq: Every day | ORAL | Status: DC

## 2015-04-03 MED ORDER — HYDROMORPHONE HCL 2 MG PO TABS: 1.0000 mg | ORAL_TABLET | Freq: Four times a day (QID) | ORAL | Status: DC | PRN

## 2015-04-03 MED ORDER — SPIRONOLACTONE 100 MG PO TABS: 100.0000 mg | ORAL_TABLET | Freq: Every day | ORAL | Status: DC

## 2015-04-03 MED ORDER — METHOCARBAMOL 500 MG PO TABS
500.0000 mg | ORAL_TABLET | Freq: Three times a day (TID) | ORAL | Status: DC | PRN
  Administered 2015-04-03: 500 mg via ORAL
  Filled 2015-04-03: qty 1

## 2015-04-03 MED ORDER — METHOCARBAMOL 500 MG PO TABS: 500.0000 mg | ORAL_TABLET | Freq: Three times a day (TID) | ORAL | Status: DC | PRN

## 2015-04-03 NOTE — Discharge Summary (Signed)
Physician Discharge Summary  [name] [id number] DOB: [birth year] DOA: 03/28/2015  PCP: Cathlean Cower, MD  Admit date: 03/28/2015 Discharge date: 04/03/2015  Time spent: 20 minutes  Recommendations for Outpatient Follow-up:  1. Follow up with PCP in 1-2 weeks 2. Follow up with GI (Dr. Carlean Purl)  for follow up  3. Would repeat comprehensive metabolic panel in 1-2 weeks  Discharge Diagnoses:  Principal Problem:   Alcoholic cirrhosis of liver with ascites ([place]) Active Problems:   Thrombocytopenia ([place])   Ascites   Poorly controlled type 2 diabetes mellitus ([place])   Acute generalized abdominal pain   Ascites due to alcoholic cirrhosis [facility])   Discharge Condition: Improved  Diet recommendation: Diabetic, low sodium  Filed Weights   04/01/15 0454 04/02/15 0609 04/03/15 0500  Weight: 50.259 kg (110 lb 12.8 oz) 52.4 kg (115 lb 8.3 oz) 47.537 kg (104 lb 12.8 oz)    History of present illness:  Please review dictated H and P from 12/6 for details. Briefly, 49 y.o. 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 Patient was noted to have increased ascites and was mildly hypoxic. The patient was admitted for further work up.  Hospital Course:   Ascites with decompensated cirrhosis of the liver   Patient was been continued on a higher dose of Lasix and spironolactone, dose recently increased by GI.  Good urine output and stable renal function   Pt s/p ultrasound-guided paracentesis on 12/7 yielding 3.7L fluid with repeat paracentesis on 12/9 yielding 2L fluid  Pathology from ascitic fluid reveals reactive cells. No evidence of malignancy  RUQ discomfort most likely from ascites with acute rib fracture  Pain likely secondary to marked ascites in addition to acute R nondisplaced 7th rib fracture s/p  recent fall at police station  Given history of gallstones, HIDA scan was also done - unremarkable  Cont analgesics as tolerated.  Diabetes mellitus type 2 uncontrolled   Cont to follow CBGs with sliding scale coverage.  Continued 5 units aspart pre-meal  Recently increased lantus to 18 units per Diabetic Coordinator recs  Glucose stable thus far  Hypoxia and sinus tachycardia   Suspect from ascites and discomfort.   Much improved/resolved  Chronic thrombocytopenia   probably from cirrhosis and alcoholism  Cont to follow CBC closely.  History of alcoholism  Reports last ETOH intake one day prior to admit   Cont on CIWA protocol.  Cessation was done  No signs of withdrawal this admission  Elevated LFTs  HIDA scan was unremarkable for active biliary disease  Suspect elevated LFT's from cirrhosis vs etoh hepatitis  Would continue to follow LFTs closely as outpatient  Procedures:  US guided paracentesis 12/7, and 12/9  Consultations:  GI  IR  Discharge Exam: Filed Vitals:   04/02/15 1300 04/02/15 2255 04/03/15 0500 04/03/15 0518  BP: 106/71 113/79  112/83  Pulse: 96 83  85  Temp: 98.4 F (36.9 C) 98.7 F (37.1 C)  98.4 F (36.9 C)  TempSrc: Oral Oral  Oral  Resp: 18 18  18   Height:      Weight:   47.537 kg (104 lb 12.8 oz)   SpO2: 99% 97%  98%    General: Awake, in nad Cardiovascular: regular, s1, s2 Respiratory: normal resp effort, no wheezing  Discharge Instructions     Medication List  STOP taking these medications        oxyCODONE 5 MG immediate release tablet  Commonly known as:  Oxy IR/ROXICODONE     oxyCODONE-acetaminophen 5-325 MG tablet  Commonly known as:  PERCOCET/ROXICET      TAKE these medications        calcium carbonate 750 MG chewable tablet  Commonly known as:  TUMS EX  Chew 3 tablets by mouth 4 (four) times daily as needed for heartburn.     diphenhydrAMINE 25 MG tablet  Commonly known as:  BENADRYL   Take 50 mg by mouth at bedtime as needed for sleep.     docusate sodium 100 MG capsule  Commonly known as:  COLACE  Take 1 capsule (100 mg total) by mouth 2 (two) times daily.     esomeprazole 40 MG capsule  Commonly known as:  NEXIUM  Take 40 mg by mouth daily as needed (heartburn).     furosemide 20 MG tablet  Commonly known as:  LASIX  Take 3 tablets (60 mg total) by mouth daily.     HYDROmorphone 2 MG tablet  Commonly known as:  DILAUDID  Take 0.5 tablets (1 mg total) by mouth every 6 (six) hours as needed for severe pain.     ibuprofen 400 MG tablet  Commonly known as:  ADVIL,MOTRIN  Take 1 tablet (400 mg total) by mouth every 6 (six) hours as needed.     insulin aspart 100 UNIT/ML injection  Commonly known as:  novoLOG  Inject 3 Units into the skin 3 (three) times daily before meals.     NOVOLOG FLEXPEN 100 UNIT/ML FlexPen  Generic drug:  insulin aspart  INJECT 2 UNITS INTO SKIN 3 TIMES DAILY BEFORE MEALS     insulin glargine 100 UNIT/ML injection  Commonly known as:  LANTUS  Inject 0.15 mLs (15 Units total) into the skin at bedtime.     KLOR-CON M10 10 MEQ tablet  Generic drug:  potassium chloride  Take 10 mEq by mouth daily.     lactulose 10 GM/15ML solution  Commonly known as:  CHRONULAC  Take 45 mLs (30 g total) by mouth daily.     metFORMIN 500 MG tablet  Commonly known as:  GLUCOPHAGE  Take 500 mg by mouth 2 (two) times daily.     methocarbamol 500 MG tablet  Commonly known as:  ROBAXIN  Take 1 tablet (500 mg total) by mouth every 8 (eight) hours as needed for muscle spasms (neck spasm/pain).     multivitamin with minerals Tabs tablet  Take 1 tablet by mouth daily.     sertraline 100 MG tablet  Commonly known as:  ZOLOFT  Take 150 mg by mouth daily.     spironolactone 100 MG tablet  Commonly known as:  ALDACTONE  Take 1 tablet (100 mg total) by mouth daily.       Allergies  Allergen Reactions  . Betadine [Povidone Iodine] Hives    Follow-up Information    Follow up with Cathlean Cower, MD. Schedule an appointment as soon as possible for a visit in 1 week.   Specialties:  Internal Medicine, Radiology   Why:  Hospital follow up   Contact information:   [place]  [id number] [phone]       Schedule an appointment as soon as possible for a visit with Silvano Rusk, MD.   Specialty:  Gastroenterology   Why:  Hospital follow up   Contact information:  [address] [place] [address] [phone]        The results of significant diagnostics from this hospitalization (including imaging, microbiology, ancillary and laboratory) are listed below for reference.    Significant Diagnostic Studies: Dg Chest 2 View  03/29/2015  CLINICAL DATA:  Acute onset of shortness of breath and generalized abdominal distention. Initial encounter. EXAM: CHEST  2 VIEW COMPARISON:  Chest radiograph performed 03/18/2015 FINDINGS: The lungs are hypoexpanded. Vascular crowding is noted. Mild bibasilar opacities likely reflect atelectasis. No pleural effusion or pneumothorax is seen. The cardiomediastinal silhouette is borderline normal in size. No acute osseous abnormalities are identified. IMPRESSION: Lungs hypoexpanded. Mild bibasilar airspace opacities likely reflect atelectasis. Electronically Signed   By: Garald Balding M.D.   On: 03/29/2015 01:57   Dg Ribs Unilateral W/chest Right  03/18/2015  CLINICAL DATA:  Right lower lateral rib pain after falling today EXAM: RIGHT RIBS AND CHEST - 3+ VIEW COMPARISON:  None. FINDINGS: Heart size is normal. Mild bibasilar atelectasis. No pneumothorax or effusion. Mild deformity posterior lateral right third rib likely due to prior trauma. No acute rib fracture identified. IMPRESSION: No acute findings Electronically Signed   By: Skipper Cliche M.D.   On: 03/18/2015 18:47   Nm Hepatobiliary Liver Func  03/29/2015  CLINICAL DATA:  Generalized abdominal pain. Nausea and  vomiting for 2 weeks. Abdominal distention. Personal history of pancreatitis EXAM: NUCLEAR MEDICINE HEPATOBILIARY IMAGING TECHNIQUE: Sequential images of the abdomen were obtained out to 60 minutes following intravenous administration of radiopharmaceutical. RADIOPHARMACEUTICALS:  5.5 mCi Tc-50m  Choletec IV COMPARISON:  CT of the abdomen and pelvis with contrast 01/02/2015. FINDINGS: Normal hepatic uptake is present. The gallbladder can be seen by 20 minutes. There is excretion of radiopharmaceutical into the small bowel. IMPRESSION: Negative hepatobiliary scan with normal hepatic uptake, gallbladder concentration, and excretion. Electronically Signed   By: San Morelle M.D.   On: 03/29/2015 13:24   Ct Abdomen Pelvis W Contrast  03/30/2015  CLINICAL DATA:  Cirrhosis secondary to alcoholism and hepatitis-C. Acute generalized abdominal pain and increasing abdominal distension. Status post paracentesis 1 day prior. EXAM: CT ABDOMEN AND PELVIS WITH CONTRAST TECHNIQUE: Multidetector CT imaging of the abdomen and pelvis was performed using the standard protocol following bolus administration of intravenous contrast. CONTRAST:  147mL OMNIPAQUE IOHEXOL 300 MG/ML  SOLN COMPARISON:  01/02/2015 CT abdomen/ pelvis.  11/12/2014 MRI abdomen. FINDINGS: Lower chest: Mild bibasilar scarring versus atelectasis. There are moderate paraesophageal varices. Hepatobiliary: The liver surface is diffusely markedly irregular and there is relative atrophy of the right liver lobe and relative hypertrophy of the left liver lobe and caudate lobe, in keeping with advanced cirrhosis. There patchy bandlike regions of low attenuation throughout the liver parenchyma, most prominent in the segment 8 right liver lobe, most in keeping with confluent hepatic fibrosis. No discrete liver mass is detected on this routine study. There is a calcified subcentimeter gallstone layering in the nondistended gallbladder, with no definite gallbladder  wall thickening. No biliary ductal dilatation. Pancreas: Normal, with no mass or duct dilation. Spleen: Stable mild splenomegaly (craniocaudal splenic length 14.1 cm, previously 14.6 cm, not appreciably changed). No splenic mass. Adrenals/Urinary Tract: Normal adrenals. Subcentimeter hypodense renal cortical lesion in the upper left kidney, too small to characterize, unchanged, probably a benign renal cyst. Otherwise normal kidneys, with no hydronephrosis. Normal bladder. Stomach/Bowel: Grossly normal stomach. Stable top-normal caliber proximal jejunal small bowel loops with gradual transition to normal caliber mid to distal small bowel loops. No focal  small bowel caliber transition. Oral contrast progresses to the distal small bowel. No definite small bowel wall thickening. Status post appendectomy. Normal large bowel with no diverticulosis, large bowel wall thickening or pericolonic fat stranding. Vascular/Lymphatic: Normal caliber abdominal aorta. Patent portal, splenic, hepatic and renal veins. Stable small paraumbilical varices. Stable mildly enlarged 1.3 cm porta hepatis node (series 2/ image 27). No new abdominopelvic lymphadenopathy. Reproductive: Status post hysterectomy, with no abnormal findings at the vaginal cuff. No adnexal mass. Other: No pneumoperitoneum. Moderate ascites. Musculoskeletal: No aggressive appearing focal osseous lesions. There is an acute nondisplaced lateral right seventh rib fracture. Mild degenerative changes in the visualized thoracolumbar spine. Anasarca. IMPRESSION: 1. Acute nondisplaced lateral right seventh rib fracture. 2. Advanced cirrhosis. No discrete liver mass on this routine CT study . 3. Stable findings of portal hypertension, including moderate ascites, mild splenomegaly and paraumbilical and paraesophageal varices. 4. Stable mild porta hepatis lymphadenopathy, nonspecific, likely secondary to chronic liver disease. 5. Cholelithiasis, with no evidence of acute  cholecystitis. 6. No evidence of bowel obstruction or acute bowel inflammation. Electronically Signed   By: Ilona Sorrel M.D.   On: 03/30/2015 18:53   US Paracentesis  03/31/2015  INDICATION: Hepatitis-C, cirrhosis, recurrent ascites. Request is made for diagnostic and therapeutic paracentesis. EXAM: ULTRASOUND-GUIDED PARACENTESIS COMPARISON:  Paracentesis 03/29/15. MEDICATIONS: None. COMPLICATIONS: None immediate TECHNIQUE: Informed written consent was obtained from the patient after a discussion of the risks, benefits and alternatives to treatment. A timeout was performed prior to the initiation of the procedure. Initial ultrasound scanning demonstrates a moderate amount of ascites within the right lower abdominal quadrant. The right lower abdomen was prepped and draped in the usual sterile fashion. 1% lidocaine was used for local anesthesia. Under direct ultrasound guidance, a 19 gauge, 7-cm, Yueh catheter was introduced. An ultrasound image was saved for documentation purposed. The paracentesis was performed. The catheter was removed and a dressing was applied. The patient tolerated the procedure well without immediate post procedural complication. FINDINGS: A total of approximately 2 liters of serous fluid was removed. Samples were sent to the laboratory as requested by the clinical team. IMPRESSION: Successful ultrasound-guided paracentesis yielding 2 liters of peritoneal fluid. Read By:  Tsosie Billing PA-C Electronically Signed   By: Corrie Mckusick D.O.   On: 03/31/2015 14:57   US Paracentesis  03/29/2015  CLINICAL DATA:  Abdominal distention secondary to recurrent ascites. Request diagnostic and therapeutic paracentesis. EXAM: ULTRASOUND GUIDED PARACENTESIS COMPARISON:  None. PROCEDURE: An ultrasound guided paracentesis was thoroughly discussed with the patient and questions answered. The benefits, risks, alternatives and complications were also discussed. The patient understands and wishes to proceed  with the procedure. Written consent was obtained. Ultrasound was performed to localize and mark an adequate pocket of fluid in the right lower quadrant of the abdomen. The area was then prepped and draped in the normal sterile fashion. 1% Lidocaine was used for local anesthesia. Under ultrasound guidance a 19 gauge Yueh catheter was introduced. Paracentesis was performed. The catheter was removed and a dressing applied. COMPLICATIONS: None immediate FINDINGS: A total of approximately 3.7 L of clear yellow fluid was removed. A fluid sample was sent for laboratory analysis. IMPRESSION: Successful ultrasound guided paracentesis yielding 3.7 L of ascites. Read by: Ascencion Dike PA-C Electronically Signed   By: Sandi Mariscal M.D.   On: 03/29/2015 11:51    Microbiology: Recent Results (from the past 240 hour(s))  Culture, body fluid-bottle     Status: None (Preliminary result)   Collection Time: 03/29/15  12:43 PM  Result Value Ref Range Status   Specimen Description FLUID PERITONEAL  Final   Special Requests NONE  Final   Culture   Final    NO GROWTH 4 DAYS Performed at [facility]    Report Status PENDING  Incomplete  Gram stain     Status: None   Collection Time: 03/29/15 12:43 PM  Result Value Ref Range Status   Specimen Description FLUID PERITONEAL  Final   Special Requests NONE  Final   Gram Stain   Final    RARE WBC PRESENT,BOTH PMN AND MONONUCLEAR NO ORGANISMS SEEN Performed at [facility]    Report Status 03/29/2015 FINAL  Final  Culture, body fluid-bottle     Status: None (Preliminary result)   Collection Time: 03/31/15 11:37 AM  Result Value Ref Range Status   Specimen Description FLUID ASCITIC  Final   Special Requests NONE  Final   Culture   Final    NO GROWTH 2 DAYS Performed at [facility]    Report Status PENDING  Incomplete  Gram stain     Status: None   Collection Time: 03/31/15 11:37 AM  Result Value Ref Range Status   Specimen Description  FLUID ASCITIC  Final   Special Requests NONE  Final   Gram Stain   Final    RARE WBC PRESENT, PREDOMINANTLY MONONUCLEAR NO ORGANISMS SEEN Performed at [facility]    Report Status 03/31/2015 FINAL  Final     Labs: Basic Metabolic Panel:  Recent Labs Lab 03/30/15 1650 03/31/15 0500 04/01/15 0507 04/02/15 0502 04/03/15 0445  NA 135 133* 132* 138 131*  K 3.8 4.3 3.4* 3.9 3.7  CL 94* 94* 98* 100* 94*  CO2 36* 35* 31 32 31  GLUCOSE 103* 254* 200* 187* 294*  BUN 9 9 10 13 11   CREATININE 0.46 0.46 0.38* 0.38* 0.46  CALCIUM 8.7* 8.7* 8.4* 8.9 8.4*   Liver Function Tests:  Recent Labs Lab 03/30/15 0432 03/31/15 0500 04/01/15 0507 04/02/15 0502 04/03/15 0445  AST 81* 83* 71* 84* 81*  ALT 57* 58* 49 58* 61*  ALKPHOS 145* 145* 124 141* 155*  BILITOT 1.5* 1.4* 1.3* 1.3* 1.3*  PROT 6.4* 6.6 6.1* 6.3* 6.4*  ALBUMIN 2.5* 2.6* 2.9* 2.8* 2.8*    Recent Labs Lab 03/28/15 2330  LIPASE 55*   No results for input(s): AMMONIA in the last 168 hours. CBC:  Recent Labs Lab 03/28/15 2330 03/29/15 0518 03/30/15 1650  WBC 3.1* 3.3* 3.6*  NEUTROABS 2.0 2.0  --   HGB 14.6 12.9 14.4  HCT 41.9 39.2 42.9  MCV 104.2* 107.1* 105.4*  PLT 88* 72* 81*   Cardiac Enzymes: No results for input(s): CKTOTAL, CKMB, CKMBINDEX, TROPONINI in the last 168 hours. BNP: BNP (last 3 results) No results for input(s): BNP in the last 8760 hours.  ProBNP (last 3 results) No results for input(s): PROBNP in the last 8760 hours.  CBG:  Recent Labs Lab 04/02/15 1155 04/02/15 1646 04/02/15 2113 04/03/15 0740 04/03/15 1159  GLUCAP 307* 86 293* 183* 173*     Signed:  [place] Holian K  [facility] 04/03/2015, 12:43 PM

## 2015-04-03 NOTE — Discharge Instructions (Signed)
Do not drive, operate heavy machinery, or handle fragile objects such as small children if taking your sedating medications such as muscle relaxant and pain medications.

## 2015-04-05 LAB — CULTURE, BODY FLUID W GRAM STAIN -BOTTLE

## 2015-04-05 LAB — CULTURE, BODY FLUID-BOTTLE: CULTURE: NO GROWTH

## 2015-04-21 ENCOUNTER — Telehealth: Payer: Self-pay | Admitting: *Deleted

## 2015-04-21 ENCOUNTER — Other Ambulatory Visit: Payer: Self-pay | Admitting: Internal Medicine

## 2015-04-21 NOTE — Telephone Encounter (Signed)
Receive call pt states md rx Lantus instead of using vial pt is wanting to change to Lantus Pen. Verified pharmacy inform will send to [facility].../lmb

## 2015-04-25 ENCOUNTER — Ambulatory Visit: Payer: Self-pay | Admitting: Endocrinology

## 2015-04-26 ENCOUNTER — Other Ambulatory Visit: Payer: Self-pay | Admitting: Internal Medicine

## 2015-04-26 MED ORDER — INSULIN GLARGINE 100 UNIT/ML SOLOSTAR PEN: 18.0000 [IU] | PEN_INJECTOR | Freq: Every day | SUBCUTANEOUS | Status: DC

## 2015-04-26 NOTE — Telephone Encounter (Signed)
lantus solostart done erx at 18 units - same as d/c dose from dec 2016

## 2015-04-27 ENCOUNTER — Other Ambulatory Visit: Payer: Self-pay | Admitting: Internal Medicine

## 2015-04-29 ENCOUNTER — Other Ambulatory Visit: Payer: Self-pay | Admitting: Internal Medicine

## 2015-05-22 ENCOUNTER — Other Ambulatory Visit: Payer: Self-pay | Admitting: Internal Medicine

## 2015-05-23 ENCOUNTER — Observation Stay (HOSPITAL_COMMUNITY)
Admission: EM | Admit: 2015-05-23 | Discharge: 2015-05-27 | Disposition: A | Discharge status: Home / Self Care | Payer: Medicaid Other | Attending: Internal Medicine | Admitting: Internal Medicine

## 2015-05-23 ENCOUNTER — Encounter (HOSPITAL_COMMUNITY): Payer: Self-pay

## 2015-05-23 ENCOUNTER — Emergency Department (HOSPITAL_COMMUNITY): Payer: Medicaid Other

## 2015-05-23 DIAGNOSIS — Z791 Long term (current) use of non-steroidal anti-inflammatories (NSAID): Secondary | ICD-10-CM | POA: Diagnosis not present

## 2015-05-23 DIAGNOSIS — R109 Unspecified abdominal pain: Secondary | ICD-10-CM

## 2015-05-23 DIAGNOSIS — R000 Tachycardia, unspecified: Secondary | ICD-10-CM | POA: Diagnosis not present

## 2015-05-23 DIAGNOSIS — K7290 Hepatic failure, unspecified without coma: Secondary | ICD-10-CM | POA: Diagnosis not present

## 2015-05-23 DIAGNOSIS — E43 Unspecified severe protein-calorie malnutrition: Secondary | ICD-10-CM | POA: Diagnosis present

## 2015-05-23 DIAGNOSIS — R188 Other ascites: Secondary | ICD-10-CM

## 2015-05-23 DIAGNOSIS — E1165 Type 2 diabetes mellitus with hyperglycemia: Secondary | ICD-10-CM | POA: Diagnosis not present

## 2015-05-23 DIAGNOSIS — Y908 Blood alcohol level of 240 mg/100 ml or more: Secondary | ICD-10-CM | POA: Insufficient documentation

## 2015-05-23 DIAGNOSIS — D72819 Decreased white blood cell count, unspecified: Secondary | ICD-10-CM | POA: Diagnosis present

## 2015-05-23 DIAGNOSIS — Z794 Long term (current) use of insulin: Secondary | ICD-10-CM | POA: Insufficient documentation

## 2015-05-23 DIAGNOSIS — Z79899 Other long term (current) drug therapy: Secondary | ICD-10-CM | POA: Insufficient documentation

## 2015-05-23 DIAGNOSIS — Z23 Encounter for immunization: Secondary | ICD-10-CM | POA: Diagnosis not present

## 2015-05-23 DIAGNOSIS — K5900 Constipation, unspecified: Secondary | ICD-10-CM | POA: Diagnosis not present

## 2015-05-23 DIAGNOSIS — F10229 Alcohol dependence with intoxication, unspecified: Secondary | ICD-10-CM | POA: Diagnosis not present

## 2015-05-23 DIAGNOSIS — B1920 Unspecified viral hepatitis C without hepatic coma: Secondary | ICD-10-CM | POA: Diagnosis not present

## 2015-05-23 DIAGNOSIS — Z681 Body mass index (BMI) 19 or less, adult: Secondary | ICD-10-CM | POA: Diagnosis not present

## 2015-05-23 DIAGNOSIS — R739 Hyperglycemia, unspecified: Secondary | ICD-10-CM

## 2015-05-23 DIAGNOSIS — D696 Thrombocytopenia, unspecified: Secondary | ICD-10-CM | POA: Diagnosis present

## 2015-05-23 DIAGNOSIS — K219 Gastro-esophageal reflux disease without esophagitis: Secondary | ICD-10-CM | POA: Insufficient documentation

## 2015-05-23 DIAGNOSIS — K7031 Alcoholic cirrhosis of liver with ascites: Secondary | ICD-10-CM | POA: Diagnosis not present

## 2015-05-23 DIAGNOSIS — F329 Major depressive disorder, single episode, unspecified: Secondary | ICD-10-CM | POA: Diagnosis present

## 2015-05-23 DIAGNOSIS — F1410 Cocaine abuse, uncomplicated: Secondary | ICD-10-CM | POA: Diagnosis not present

## 2015-05-23 DIAGNOSIS — F10120 Alcohol abuse with intoxication, uncomplicated: Secondary | ICD-10-CM | POA: Diagnosis not present

## 2015-05-23 DIAGNOSIS — F10929 Alcohol use, unspecified with intoxication, unspecified: Secondary | ICD-10-CM | POA: Diagnosis present

## 2015-05-23 DIAGNOSIS — Z9119 Patient's noncompliance with other medical treatment and regimen: Secondary | ICD-10-CM | POA: Diagnosis not present

## 2015-05-23 DIAGNOSIS — F1020 Alcohol dependence, uncomplicated: Secondary | ICD-10-CM | POA: Diagnosis present

## 2015-05-23 DIAGNOSIS — F1029 Alcohol dependence with unspecified alcohol-induced disorder: Secondary | ICD-10-CM | POA: Diagnosis not present

## 2015-05-23 DIAGNOSIS — K7460 Unspecified cirrhosis of liver: Secondary | ICD-10-CM

## 2015-05-23 LAB — URINALYSIS, ROUTINE W REFLEX MICROSCOPIC
Bilirubin Urine: NEGATIVE
Glucose, UA: >1000 mg/dL — AB
Ketones, ur: NEGATIVE mg/dL
LEUKOCYTES UA: NEGATIVE
Nitrite: NEGATIVE
Protein, ur: NEGATIVE mg/dL
SPECIFIC GRAVITY, URINE: 1.042 — AB (ref 1.005–1.030)
pH: 6.5 (ref 5.0–8.0)

## 2015-05-23 LAB — COMPREHENSIVE METABOLIC PANEL
ALT: 90 U/L — ABNORMAL HIGH (ref 14–54)
ANION GAP: 14 (ref 5–15)
AST: 146 U/L — ABNORMAL HIGH (ref 15–41)
Albumin: 3.3 g/dL — ABNORMAL LOW (ref 3.5–5.0)
Alkaline Phosphatase: 218 U/L — ABNORMAL HIGH (ref 38–126)
BILIRUBIN TOTAL: 1.7 mg/dL — AB (ref 0.3–1.2)
BUN: 8 mg/dL (ref 6–20)
CO2: 22 mmol/L (ref 22–32)
Calcium: 8.8 mg/dL — ABNORMAL LOW (ref 8.9–10.3)
Chloride: 99 mmol/L — ABNORMAL LOW (ref 101–111)
Creatinine, Ser: 0.52 mg/dL (ref 0.44–1.00)
GFR calc Af Amer: >60 mL/min (ref >60)
GFR calc non Af Amer: >60 mL/min (ref >60)
GLUCOSE: 511 mg/dL — AB (ref 65–99)
POTASSIUM: 3.8 mmol/L (ref 3.5–5.1)
SODIUM: 135 mmol/L (ref 135–145)
TOTAL PROTEIN: 8.3 g/dL — AB (ref 6.5–8.1)

## 2015-05-23 LAB — URINE MICROSCOPIC-ADD ON
BACTERIA UA: NONE SEEN
WBC, UA: NONE SEEN WBC/hpf (ref 0–5)

## 2015-05-23 LAB — PROTIME-INR
INR: 1.24 (ref 0.00–1.49)
Prothrombin Time: 15.8 seconds — ABNORMAL HIGH (ref 11.6–15.2)

## 2015-05-23 LAB — CBC WITH DIFFERENTIAL/PLATELET
Basophils Absolute: 0 10*3/uL (ref 0.0–0.1)
Basophils Relative: 1 %
EOS ABS: 0.1 10*3/uL (ref 0.0–0.7)
Eosinophils Relative: 3 %
HCT: 39.6 % (ref 36.0–46.0)
Hemoglobin: 13.2 g/dL (ref 12.0–15.0)
LYMPHS ABS: 0.9 10*3/uL (ref 0.7–4.0)
LYMPHS PCT: 29 %
MCH: 35.3 pg — AB (ref 26.0–34.0)
MCHC: 33.3 g/dL (ref 30.0–36.0)
MCV: 105.9 fL — AB (ref 78.0–100.0)
MONOS PCT: 11 %
Monocytes Absolute: 0.3 10*3/uL (ref 0.1–1.0)
NEUTROS PCT: 56 %
Neutro Abs: 1.6 10*3/uL — ABNORMAL LOW (ref 1.7–7.7)
Platelets: 71 10*3/uL — ABNORMAL LOW (ref 150–400)
RBC: 3.74 MIL/uL — AB (ref 3.87–5.11)
RDW: 14.4 % (ref 11.5–15.5)
WBC: 2.9 10*3/uL — ABNORMAL LOW (ref 4.0–10.5)

## 2015-05-23 LAB — TROPONIN I: Troponin I: <0.03 ng/mL (ref <0.031)

## 2015-05-23 LAB — CBG MONITORING, ED: Glucose-Capillary: 441 mg/dL — ABNORMAL HIGH (ref 65–99)

## 2015-05-23 LAB — RAPID URINE DRUG SCREEN, HOSP PERFORMED
AMPHETAMINES: NOT DETECTED
BENZODIAZEPINES: NOT DETECTED
Barbiturates: NOT DETECTED
COCAINE: POSITIVE — AB
Opiates: NOT DETECTED
Tetrahydrocannabinol: NOT DETECTED

## 2015-05-23 LAB — ETHANOL: ALCOHOL ETHYL (B): 269 mg/dL — AB (ref <5)

## 2015-05-23 LAB — LIPASE, BLOOD: LIPASE: 94 U/L — AB (ref 11–51)

## 2015-05-23 LAB — BRAIN NATRIURETIC PEPTIDE: B Natriuretic Peptide: 21.8 pg/mL (ref 0.0–100.0)

## 2015-05-23 MED ORDER — SODIUM CHLORIDE 0.9 % IV BOLUS (SEPSIS)
1000.0000 mL | Freq: Once | INTRAVENOUS | Status: DC
Start: 2015-05-23 — End: 2015-05-24
  Administered 2015-05-23: 1000 mL via INTRAVENOUS

## 2015-05-23 MED ORDER — HYDROMORPHONE HCL 1 MG/ML IJ SOLN
0.5000 mg | INTRAMUSCULAR | Status: DC | PRN
  Administered 2015-05-23 – 2015-05-24 (×3): 0.5 mg via INTRAVENOUS
  Filled 2015-05-23 (×3): qty 1

## 2015-05-23 MED ORDER — FENTANYL CITRATE (PF) 100 MCG/2ML IJ SOLN
50.0000 ug | Freq: Once | INTRAMUSCULAR | Status: DC
  Filled 2015-05-23: qty 2

## 2015-05-23 MED ORDER — ONDANSETRON HCL 4 MG/2ML IJ SOLN
4.0000 mg | Freq: Once | INTRAMUSCULAR | Status: AC
  Administered 2015-05-23: 4 mg via INTRAVENOUS
  Filled 2015-05-23: qty 2

## 2015-05-23 MED ORDER — INSULIN ASPART 100 UNIT/ML ~~LOC~~ SOLN
10.0000 [IU] | Freq: Once | SUBCUTANEOUS | Status: AC
Start: 2015-05-23 — End: 2015-05-23
  Administered 2015-05-23: 10 [IU] via SUBCUTANEOUS
  Filled 2015-05-23: qty 1

## 2015-05-23 MED ORDER — TETANUS-DIPHTH-ACELL PERTUSSIS 5-2.5-18.5 LF-MCG/0.5 IM SUSP
0.5000 mL | Freq: Once | INTRAMUSCULAR | Status: AC
  Administered 2015-05-23: 0.5 mL via INTRAMUSCULAR
  Filled 2015-05-23: qty 0.5

## 2015-05-23 MED ORDER — SODIUM CHLORIDE 0.9 % IV SOLN: INTRAVENOUS | Status: DC

## 2015-05-23 NOTE — ED Notes (Signed)
Pt complains of abdominal pain and hx of cirrhosis, requesting help with detox, CBG per EMS 500, currently intoxicated and has abdominal distention

## 2015-05-23 NOTE — H&P (Signed)
History and Physical  Patient Name: [name]     [id number]    DOB: February 15, 1966    DOA: 05/23/2015 Referring physician: Ezequiel Essex, MD PCP: Cathlean Cower, MD      Chief Complaint: Abdominal pain  HPI: [name] is a 50 y.o. female with a past medical history significant for hep C and alcoholic cirrhosis, ongoing EtOH, and IDDM who presents with abdominal pain and ascites.  The patient was last admitted in early December, at which time she presented similarly with slowly progressive abdominal swelling, worsening abdominal pain, and tachycardia.  She was evaluated by GI, had paracentesis of 7 L total (on 2 separate occasions) (negative for SBP), had a negative d-dimer because of the tachycardia which persisted throughout the hospitalization, and had a normal HIDA scan.  Since that hospitalization she has been back home, "mostly laying in bed". She reports adherence to both furosemide and spironolactone, but that the fluid in her belly has been slowly accumulating since discharge, and now the last 5 days she has developed worsening abdominal pain. This pain is severe, constant, and diffuse. It is associated with feeling short of breath, nausea, and feeling like she can't remember things. She denies vomiting, hematemesis, fever, chills.  In the ED, she was tachycardic but afebrile. There was hyperglycemia, she was intoxicated, and complained of a wound to her left foot for which she got a Td shot.  TRH were asked to evaluate for admission and paracentesis.     Review of Systems:  Pt complains of abdominal pain, nausea, poor memory, abdominal swelling. Pt denies any fever, chills, vomiting, hematemesis, confusion, passing out.  All other systems negative except as just noted or noted in the history of present illness.  Allergies  Allergen Reactions  . Betadine [Povidone Iodine] Hives    Prior to Admission medications   Medication Sig Start Date End Date Taking? Authorizing Provider   calcium carbonate (TUMS EX) 750 MG chewable tablet Chew 3 tablets by mouth 4 (four) times daily as needed for heartburn.   Yes Historical Provider, MD  diphenhydrAMINE (BENADRYL) 25 MG tablet Take 100 mg by mouth at bedtime.    Yes Historical Provider, MD  ibuprofen (ADVIL,MOTRIN) 200 MG tablet Take 200 mg by mouth 4 (four) times daily.   Yes Historical Provider, MD  Insulin Glargine (LANTUS SOLOSTAR) 100 UNIT/ML Solostar Pen Inject 18 Units into the skin daily at 10 pm. Patient taking differently: Inject 20 Units into the skin daily at 10 pm.  04/26/15  Yes Biagio Borg, MD  KLOR-CON M10 10 MEQ tablet Take 10 mEq by mouth daily.  11/15/14  Yes Historical Provider, MD  metFORMIN (GLUCOPHAGE) 500 MG tablet TAKE 1 TABLET (500 MG TOTAL) BY MOUTH 2 (TWO) TIMES DAILY WITH A MEAL. 05/22/15  Yes Biagio Borg, MD  Multiple Vitamin (MULTIVITAMIN WITH MINERALS) TABS tablet Take 1 tablet by mouth daily.    Yes Historical Provider, MD  NOVOLOG FLEXPEN 100 UNIT/ML FlexPen INJECT 2 UNITS INTO SKIN 3 TIMES DAILY BEFORE MEALS Patient taking differently: INJECT 6 UNITS INTO SKIN 3 TIMES DAILY BEFORE MEALS 01/13/15  Yes Biagio Borg, MD  sertraline (ZOLOFT) 100 MG tablet Take 150 mg by mouth daily.   Yes Historical Provider, MD  spironolactone (ALDACTONE) 100 MG tablet Take 1 tablet (100 mg total) by mouth daily. 04/03/15  Yes Donne Hazel, MD  docusate sodium (COLACE) 100 MG capsule Take 1 capsule (100 mg total) by mouth 2 (two) times daily. Patient  not taking: Reported on 05/23/2015 11/15/14   Nat Math, MD  furosemide (LASIX) 20 MG tablet Take 3 tablets (60 mg total) by mouth daily. Patient not taking: Reported on 05/23/2015 04/03/15   Donne Hazel, MD  HYDROmorphone (DILAUDID) 2 MG tablet Take 0.5 tablets (1 mg total) by mouth every 6 (six) hours as needed for severe pain. Patient not taking: Reported on 05/23/2015 04/03/15   Donne Hazel, MD  ibuprofen (ADVIL,MOTRIN) 400 MG tablet Take 1 tablet (400 mg  total) by mouth every 6 (six) hours as needed. Patient not taking: Reported on 05/23/2015 03/18/15   Nona Dell, PA-C  insulin aspart (NOVOLOG) 100 UNIT/ML injection Inject 3 Units into the skin 3 (three) times daily before meals. Patient not taking: Reported on 05/23/2015 07/26/14   Belkys A Regalado, MD  lactulose (CHRONULAC) 10 GM/15ML solution Take 45 mLs (30 g total) by mouth daily. Patient not taking: Reported on 03/28/2015 01/20/15   Biagio Borg, MD  LANTUS 100 UNIT/ML injection INJECT 0.2 MLS (20 UNITS TOTAL) INTO THE SKIN AT BEDTIME. Patient not taking: Reported on 05/23/2015 05/01/15   Biagio Borg, MD  methocarbamol (ROBAXIN) 500 MG tablet Take 1 tablet (500 mg total) by mouth every 8 (eight) hours as needed for muscle spasms (neck spasm/pain). Patient not taking: Reported on 05/23/2015 04/03/15   Donne Hazel, MD    Past Medical History  Diagnosis Date  . Impaired glucose tolerance 11/07/2010  . Hepatitis C approx dx 2005    no tx to date  . Pancreatitis     HX of  . Alcohol dependency ([place])     at least [place] admissions 2007- 09/2013 for detox.   Marland Kitchen GERD (gastroesophageal reflux disease)   . Chronic constipation   . Gallstones   . Drug dependency ([place])     Hx of cocaine use  . Anxiety and depression 08/2011  . Rheumatoid factor positive   . Hemochromatosis     last phlebotomy tx ~ 2012. liver bx 2006  . Pancytopenia   . Abnormal vaginal bleeding     uterine fibroid  . Gallstone 2006  . Chronic abdominal pain 2011  . Cirrhosis ([place]) 11/09/2010  . Type II or unspecified type diabetes mellitus without mention of complication, uncontrolled 09/17/2011  . Major depression ([place]) 11/2012, 09/2013    [facility] admissions for this, suicide attempt by OD Ambien (2014), Prozac (2015),ETOHism   . Anxiety     Past Surgical History  Procedure Laterality Date  . Appendectomy    . Cesarean section  x 2  . Foot surgery  2013  . Abdominal hysterectomy  12/17/2011    Procedure:  HYSTERECTOMY ABDOMINAL;  Surgeon: Gus Height, MD;  Location: [facility];  Service: Gynecology;  Laterality: N/A;  . Salpingoophorectomy  12/17/2011    Procedure: SALPINGO OOPHERECTOMY;  Surgeon: Gus Height, MD;  Location: [facility];  Service: Gynecology;  Laterality: Bilateral;  . Esophagogastroduodenoscopy N/A 12/10/2013    Procedure: ESOPHAGOGASTRODUODENOSCOPY (EGD);  Surgeon: Ladene Artist, MD;  Location: [facility];  Service: Endoscopy;  Laterality: N/A;  . Percutaneous liver biopsy  2011  . Colonoscopy N/A 02/18/2014    Procedure: COLONOSCOPY;  Surgeon: Gatha Mayer, MD;  Location: [place] [facility];  Service: Endoscopy;  Laterality: N/A;    Family history: family history includes Cancer in her mother; Diabetes in her father.  Social History: Patient lives with her husband.  She is from [place] originally. She does not work, used to  be a Freight forwarder, but seeking disability. She is newly on Medicaid. She does not smoke. She drinks some days, including today. She does not use a walker or cane, but she is unable to climb stairs and lays mostly in bed all day because of fatigue.     Physical Exam: BP 125/86 mmHg  Pulse 122  Temp(Src) 98.3 F (36.8 C) (Oral)  Resp 12  Ht 5\' 2"  (1.575 m)  Wt 49.584 kg (109 lb 5 oz)  BMI 19.99 kg/m2  SpO2 95%  LMP 11/01/2011 General appearance: Thin adult female, alert and in mild distress from pain.   Eyes: Anicteric, conjunctiva pink, lids and lashes normal.  Intermittent esotropia, on left? ENT: No nasal deformity, discharge, or epistaxis.  OP moist without lesions.   Skin: Warm and dry.  No jaundice.  No suspicious rashes or lesions.  Some dilated abdominal surface veins, palmar erythema. Cardiac: Tachycardic, regular, nl S1-S2, no murmurs appreciated.  Capillary refill is brisk.  JVP normal.  No LE edema.  Radial and DP pulses 2+ and symmetric. Respiratory: Normal respiratory rate and rhythm.  CTAB without rales or wheezes. Abdomen: Abdomen  protuberant, equivalent to 38 weeks gravid uterus.  Marked TTP and guarding, limits exam, ascites suspected given distension.  MSK: No deformities or effusions. Neuro: Some cognitive slowing, but alert and oriented and answering questions appropriately.  Sensorium intact and responding to questions, attention normal.  Speech is fluent.  Moves all extremities equally and with normal coordination.    Psych: Behavior appropriate.  Affect full range.  No evidence of aural or visual hallucinations or delusions.       Labs on Admission:  The metabolic panel shows hyperglycemia without electrolyte abnormalities or renal dysfunction. The transaminases, alkaline phosphatase, and total bilirubin are chronically elevated slightly, going back to 2008. The complete blood count shows leukopenia which is unchanged from all records going back to 2008. There is macrocytosis and thrombocytopenia which is moderate at 71 K/uL. The lipase is chronically elevated, about at baseline. Alcohol level is 2 69 mg/dL. The BNP, INR, and troponin are normal.   Radiological Exams on Admission: Personally reviewed: Dg Chest 2 View 05/23/2015   Clear.  Trace pleural effusions bilaterally.   EKG: Independently reviewed. Rate 126, sinus rhythm.  QTc 500.  No ST changes.    Assessment/Plan 1. Cirrhosis with ascites:  Due to hemochromatosis, hep C and continued alcohol use. Given her symptoms match previous episodes when she had painfully large ascites without SBP, her tachycardia is chronic, the indolent presentation, and her leukopenia also chronic, my suspicion for SBP is low enough that I will defer antibiotics until after paracentesis. -Paracentesis -Continue home spironolactone and furosemide -Hydromorphone 0.5 mg q4hrs PRN for pain until paracentesis -Consult to SW (patient has Medicaid now, and tells me that her previous GI specialist Dr. Carlean Purl does not take Medicaid; given the importance of GI followup, I  would ask SW to assist patient to find a GI provider that will accept her insurance)   2. Hepatic encephalopathy:  Not taking lactulose.  No asterexis but cognitive slowing. -Restart lactulose -Check ammonia level  3. IDDM and hyperglycemia:  Patient reports taking glargine 20 units nightly and 6 units TID AC and "always" has BG > 300 mg/dL.  -Aspart 10 units now -Administer nightly glargine -Sliding scale corrections -Consult to diabetes education to review self-administration of insulin  4. Alcoholism and intoxicated at admission:  -CIWA protocol -Referral to SW for rehab resources  5. 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  Instead, repeat imaging and repeat CA19-9 were recommended, but the patient did not follow up as an outpatient. -Repeat CA19-9 -Consult to Social work re: Copy for outpatient GI follow up as above, and repeat MR as outpatient if able  6. Thrombocytopenia and leukopenia:  Chronic and unchanged.  Former is likely from portal hypertension.  Unclear etiology of latter, but stable.  7. Severe PCM  8. L great toe wound: Tetanus shot administered -Monitor clinically     DVT PPx: SCDs given platelets Diet: Regular Consultants: Diabetes ed and Social work Code Status: Full Family Communication: None  Medical decision making: What exists of the patient's previous chart was reviewed in depth and the case was discussed with Dr. Wyvonnia Dusky. Patient seen 11:41 PM on 05/23/2015.  Disposition Plan:  I recommend admission to med sug.  Clinical condition: stable.  Anticipate paracentesis tomorrow and discharge afterwards.      Edwin Dada [facility] Pager [phone]

## 2015-05-23 NOTE — ED Notes (Signed)
Patient transported to X-ray 

## 2015-05-23 NOTE — ED Provider Notes (Signed)
CSN: [id number]     Arrival date & time 05/23/15  2044 History   First MD Initiated Contact with Patient 05/23/15 2108     Chief Complaint  Patient presents with  . Abdominal Pain     (Consider location/radiation/quality/duration/timing/severity/associated sxs/prior Treatment) HPI Comments: Patient with history of diabetes, hepatitis C, cirrhosis resulting with worsening abdominal pain and distention. She has been self treating her pain with alcohol. Her last paracentesis was per record on December 9. She does not have paracentesis scheduled regularly. Denies any fever. Denies any vomiting. She denies any diarrhea. She feels pain causing her to have some shortness of breath but denies any chest pain. She states compliance with her medications. Her last drink of alcohol was prior to coming in tonight. States she does not drink all the time.  The history is provided by the patient.    Past Medical History  Diagnosis Date  . Impaired glucose tolerance 11/07/2010  . Hepatitis C approx dx 2005    no tx to date  . Pancreatitis     HX of  . Alcohol dependency ([place])     at least [place] admissions 2007- 09/2013 for detox.   Marland Kitchen GERD (gastroesophageal reflux disease)   . Chronic constipation   . Gallstones   . Drug dependency ([place])     Hx of cocaine use  . Anxiety and depression 08/2011  . Rheumatoid factor positive   . Hemochromatosis     last phlebotomy tx ~ 2012. liver bx 2006  . Pancytopenia   . Abnormal vaginal bleeding     uterine fibroid  . Gallstone 2006  . Chronic abdominal pain 2011  . Cirrhosis ([place]) 11/09/2010  . Type II or unspecified type diabetes mellitus without mention of complication, uncontrolled 09/17/2011  . Major depression ([place]) 11/2012, 09/2013    [facility] admissions for this, suicide attempt by OD Ambien (2014), Prozac (2015),ETOHism   . Anxiety    Past Surgical History  Procedure Laterality Date  . Appendectomy    . Cesarean section  x 2  . Foot surgery  2013  .  Abdominal hysterectomy  12/17/2011    Procedure: HYSTERECTOMY ABDOMINAL;  Surgeon: Gus Height, MD;  Location: [facility];  Service: Gynecology;  Laterality: N/A;  . Salpingoophorectomy  12/17/2011    Procedure: SALPINGO OOPHERECTOMY;  Surgeon: Gus Height, MD;  Location: [facility];  Service: Gynecology;  Laterality: Bilateral;  . Esophagogastroduodenoscopy N/A 12/10/2013    Procedure: ESOPHAGOGASTRODUODENOSCOPY (EGD);  Surgeon: Ladene Artist, MD;  Location: [facility];  Service: Endoscopy;  Laterality: N/A;  . Percutaneous liver biopsy  2011  . Colonoscopy N/A 02/18/2014    Procedure: COLONOSCOPY;  Surgeon: Gatha Mayer, MD;  Location: [facility];  Service: Endoscopy;  Laterality: N/A;   Family History  Problem Relation Age of Onset  . Cancer Mother     ovarian  . Diabetes Father    Social History  Substance Use Topics  . Smoking status: Never Smoker   . Smokeless tobacco: Never Used  . Alcohol Use: Yes     Comment: one pint vodka daily years   OB History    Gravida Para Term Preterm AB TAB SAB Ectopic Multiple Living   2 2 2       3      Review of Systems  Constitutional: Positive for activity change, appetite change and fatigue. Negative for fever.  HENT: Negative for congestion.   Respiratory: Negative for cough, chest tightness and shortness of breath.  Gastrointestinal: Positive for nausea and abdominal pain. Negative for diarrhea and constipation.  Genitourinary: Negative for dysuria, vaginal bleeding and vaginal discharge.  Musculoskeletal: Negative for arthralgias.  Skin: Negative for wound.  Neurological: Negative for dizziness, weakness and headaches.  A complete 10 system review of systems was obtained and all systems are negative except as noted in the HPI and PMH.      Allergies  Betadine  Home Medications   Prior to Admission medications   Medication Sig Start Date End Date Taking? Authorizing Provider  calcium carbonate (TUMS EX) 750 MG chewable tablet  Chew 3 tablets by mouth 4 (four) times daily as needed for heartburn.   Yes Historical Provider, MD  diphenhydrAMINE (BENADRYL) 25 MG tablet Take 100 mg by mouth at bedtime.    Yes Historical Provider, MD  ibuprofen (ADVIL,MOTRIN) 200 MG tablet Take 200 mg by mouth 4 (four) times daily.   Yes Historical Provider, MD  Insulin Glargine (LANTUS SOLOSTAR) 100 UNIT/ML Solostar Pen Inject 18 Units into the skin daily at 10 pm. Patient taking differently: Inject 20 Units into the skin daily at 10 pm.  04/26/15  Yes Biagio Borg, MD  KLOR-CON M10 10 MEQ tablet Take 10 mEq by mouth daily.  11/15/14  Yes Historical Provider, MD  metFORMIN (GLUCOPHAGE) 500 MG tablet TAKE 1 TABLET (500 MG TOTAL) BY MOUTH 2 (TWO) TIMES DAILY WITH A MEAL. 05/22/15  Yes Biagio Borg, MD  Multiple Vitamin (MULTIVITAMIN WITH MINERALS) TABS tablet Take 1 tablet by mouth daily.    Yes Historical Provider, MD  NOVOLOG FLEXPEN 100 UNIT/ML FlexPen INJECT 2 UNITS INTO SKIN 3 TIMES DAILY BEFORE MEALS Patient taking differently: INJECT 6 UNITS INTO SKIN 3 TIMES DAILY BEFORE MEALS 01/13/15  Yes Biagio Borg, MD  sertraline (ZOLOFT) 100 MG tablet Take 150 mg by mouth daily.   Yes Historical Provider, MD  spironolactone (ALDACTONE) 100 MG tablet Take 1 tablet (100 mg total) by mouth daily. 04/03/15  Yes Donne Hazel, MD  docusate sodium (COLACE) 100 MG capsule Take 1 capsule (100 mg total) by mouth 2 (two) times daily. Patient not taking: Reported on 05/23/2015 11/15/14   Nat Math, MD  furosemide (LASIX) 20 MG tablet Take 3 tablets (60 mg total) by mouth daily. Patient not taking: Reported on 05/23/2015 04/03/15   Donne Hazel, MD  HYDROmorphone (DILAUDID) 2 MG tablet Take 0.5 tablets (1 mg total) by mouth every 6 (six) hours as needed for severe pain. Patient not taking: Reported on 05/23/2015 04/03/15   Donne Hazel, MD  ibuprofen (ADVIL,MOTRIN) 400 MG tablet Take 1 tablet (400 mg total) by mouth every 6 (six) hours as needed. Patient  not taking: Reported on 05/23/2015 03/18/15   Nona Dell, PA-C  insulin aspart (NOVOLOG) 100 UNIT/ML injection Inject 3 Units into the skin 3 (three) times daily before meals. Patient not taking: Reported on 05/23/2015 07/26/14   Belkys A Regalado, MD  lactulose (CHRONULAC) 10 GM/15ML solution Take 45 mLs (30 g total) by mouth daily. Patient not taking: Reported on 03/28/2015 01/20/15   Biagio Borg, MD  LANTUS 100 UNIT/ML injection INJECT 0.2 MLS (20 UNITS TOTAL) INTO THE SKIN AT BEDTIME. Patient not taking: Reported on 05/23/2015 05/01/15   Biagio Borg, MD  methocarbamol (ROBAXIN) 500 MG tablet Take 1 tablet (500 mg total) by mouth every 8 (eight) hours as needed for muscle spasms (neck spasm/pain). Patient not taking: Reported on 05/23/2015 04/03/15   Donne Hazel,  MD   BP 125/86 mmHg  Pulse 122  Temp(Src) 98.3 F (36.8 C) (Oral)  Resp 12  Ht 5\' 2"  (1.575 m)  Wt 109 lb 5 oz (49.584 kg)  BMI 19.99 kg/m2  SpO2 95%  LMP 11/01/2011 Physical Exam  Constitutional: She is oriented to person, place, and time. She appears well-developed and well-nourished. No distress.  Smells of alcohol, tachycardic  HENT:  Head: Normocephalic and atraumatic.  Mouth/Throat: Oropharynx is clear and moist. No oropharyngeal exudate.  Eyes: Conjunctivae and EOM are normal. Pupils are equal, round, and reactive to light.  Neck: Normal range of motion. Neck supple.  No meningismus.  Cardiovascular: Normal rate, regular rhythm, normal heart sounds and intact distal pulses.   No murmur heard. Pulmonary/Chest: Effort normal and breath sounds normal. No respiratory distress.  Abdominal: Soft. She exhibits distension. There is tenderness. There is no rebound and no guarding.  Distended abdomen, diffusely tender, no guarding or rebound. Positive fluid wave.  Musculoskeletal: Normal range of motion. She exhibits no edema or tenderness.  Puncture wound to L great toe  Neurological: She is alert and oriented to  person, place, and time. No cranial nerve deficit. She exhibits normal muscle tone. Coordination normal.  No ataxia on finger to nose bilaterally. No pronator drift. 5/5 strength throughout. CN 2-12 intact.Equal grip strength. Sensation intact.  No asterixis  Skin: Skin is warm.  Psychiatric: She has a normal mood and affect. Her behavior is normal.  Nursing note and vitals reviewed.   ED Course  Procedures (including critical care time) Labs Review Labs Reviewed  CBC WITH DIFFERENTIAL/PLATELET - Abnormal; Notable for the following:    WBC 2.9 (*)    RBC 3.74 (*)    MCV 105.9 (*)    MCH 35.3 (*)    Platelets 71 (*)    Neutro Abs 1.6 (*)    All other components within normal limits  COMPREHENSIVE METABOLIC PANEL - Abnormal; Notable for the following:    Chloride 99 (*)    Glucose, Bld 511 (*)    Calcium 8.8 (*)    Total Protein 8.3 (*)    Albumin 3.3 (*)    AST 146 (*)    ALT 90 (*)    Alkaline Phosphatase 218 (*)    Total Bilirubin 1.7 (*)    All other components within normal limits  PROTIME-INR - Abnormal; Notable for the following:    Prothrombin Time 15.8 (*)    All other components within normal limits  URINALYSIS, ROUTINE W REFLEX MICROSCOPIC (NOT AT [facility]) - Abnormal; Notable for the following:    Specific Gravity, Urine 1.042 (*)    Glucose, UA >1000 (*)    Hgb urine dipstick TRACE (*)    All other components within normal limits  ETHANOL - Abnormal; Notable for the following:    Alcohol, Ethyl (B) 269 (*)    All other components within normal limits  URINE RAPID DRUG SCREEN, HOSP PERFORMED - Abnormal; Notable for the following:    Cocaine POSITIVE (*)    All other components within normal limits  LIPASE, BLOOD - Abnormal; Notable for the following:    Lipase 94 (*)    All other components within normal limits  URINE MICROSCOPIC-ADD ON - Abnormal; Notable for the following:    Squamous Epithelial / LPF 0-5 (*)    All other components within normal limits  CBG  MONITORING, ED - Abnormal; Notable for the following:    Glucose-Capillary 441 (*)  All other components within normal limits  TROPONIN I  BRAIN NATRIURETIC PEPTIDE    Imaging Review Dg Chest 2 View  05/23/2015  CLINICAL DATA:  Shortness of breath EXAM: CHEST  2 VIEW COMPARISON:  03/29/2015 FINDINGS: Heart size is normal. There is no pleural effusion or edema identified. No airspace consolidation noted. Mild spondylosis is present within the thoracic spine. IMPRESSION: 1. No acute cardiopulmonary abnormalities. Electronically Signed   By: Kerby Moors M.D.   On: 05/23/2015 23:16   I have personally reviewed and evaluated these images and lab results as part of my medical decision-making.   EKG Interpretation   Date/Time:  Tuesday May 23 2015 21:36:00 EST Ventricular Rate:  126 PR Interval:  133 QRS Duration: 79 QT Interval:  347 QTC Calculation: 502 R Axis:   76 Text Interpretation:  Sinus tachycardia Consider right atrial enlargement  Borderline T abnormalities, inferior leads Prolonged QT interval inferior  T wave flattening Confirmed by Wyvonnia Dusky  MD, Lyne Khurana [phone]) on 05/23/2015  9:47:10 PM      MDM   Final diagnoses:  Ascites  Alcoholic cirrhosis of liver with ascites ([place])  Hyperglycemia   Patient with history of alcohol cirrhosis, hepatitis C presenting with worsening abdominal distention and pain over the past several days. No fever. No vomiting. She is tachycardic and feels some shortness of breath that her abdominal distention. Denies chest pain. Admits to self treating her pain with alcohol.  EKG sinus tachycardia. She is afebrile. Her abdomen is diffusely tender without peritoneal signs. Low suspicion for SBP.  Labs show alcohol Intoxication, hyperglycemia without DKA, Chronic leukopenia and thrombocytopenia  Patient will be given IV fluids, insulin. Will be admitted for therapeutic paracentesis in the morning. There is a low suspicion for pulmonary  embolism, SBP.  D/w Dr. Loleta Books.   Ezequiel Essex, MD 05/24/15 [phone]

## 2015-05-24 ENCOUNTER — Observation Stay (HOSPITAL_COMMUNITY): Payer: Medicaid Other

## 2015-05-24 DIAGNOSIS — F339 Major depressive disorder, recurrent, unspecified: Secondary | ICD-10-CM

## 2015-05-24 DIAGNOSIS — F10120 Alcohol abuse with intoxication, uncomplicated: Secondary | ICD-10-CM | POA: Diagnosis not present

## 2015-05-24 DIAGNOSIS — R1011 Right upper quadrant pain: Secondary | ICD-10-CM | POA: Diagnosis not present

## 2015-05-24 DIAGNOSIS — F1029 Alcohol dependence with unspecified alcohol-induced disorder: Secondary | ICD-10-CM | POA: Diagnosis not present

## 2015-05-24 DIAGNOSIS — K7031 Alcoholic cirrhosis of liver with ascites: Secondary | ICD-10-CM | POA: Diagnosis not present

## 2015-05-24 DIAGNOSIS — F1410 Cocaine abuse, uncomplicated: Secondary | ICD-10-CM | POA: Diagnosis not present

## 2015-05-24 DIAGNOSIS — E43 Unspecified severe protein-calorie malnutrition: Secondary | ICD-10-CM

## 2015-05-24 DIAGNOSIS — R188 Other ascites: Secondary | ICD-10-CM | POA: Diagnosis not present

## 2015-05-24 DIAGNOSIS — Z794 Long term (current) use of insulin: Secondary | ICD-10-CM

## 2015-05-24 DIAGNOSIS — D696 Thrombocytopenia, unspecified: Secondary | ICD-10-CM

## 2015-05-24 DIAGNOSIS — E1165 Type 2 diabetes mellitus with hyperglycemia: Secondary | ICD-10-CM

## 2015-05-24 LAB — COMPREHENSIVE METABOLIC PANEL
ALBUMIN: 3 g/dL — AB (ref 3.5–5.0)
ALK PHOS: 181 U/L — AB (ref 38–126)
ALT: 79 U/L — ABNORMAL HIGH (ref 14–54)
ANION GAP: 11 (ref 5–15)
AST: 130 U/L — ABNORMAL HIGH (ref 15–41)
BILIRUBIN TOTAL: 1.5 mg/dL — AB (ref 0.3–1.2)
BUN: 6 mg/dL (ref 6–20)
CALCIUM: 8.7 mg/dL — AB (ref 8.9–10.3)
CO2: 28 mmol/L (ref 22–32)
Chloride: 100 mmol/L — ABNORMAL LOW (ref 101–111)
Creatinine, Ser: 0.48 mg/dL (ref 0.44–1.00)
GFR calc non Af Amer: >60 mL/min (ref >60)
GLUCOSE: 289 mg/dL — AB (ref 65–99)
POTASSIUM: 3.2 mmol/L — AB (ref 3.5–5.1)
Sodium: 139 mmol/L (ref 135–145)
TOTAL PROTEIN: 7.8 g/dL (ref 6.5–8.1)

## 2015-05-24 LAB — CBC
HEMATOCRIT: 36 % (ref 36.0–46.0)
HEMOGLOBIN: 12 g/dL (ref 12.0–15.0)
MCH: 35.3 pg — ABNORMAL HIGH (ref 26.0–34.0)
MCHC: 33.3 g/dL (ref 30.0–36.0)
MCV: 105.9 fL — ABNORMAL HIGH (ref 78.0–100.0)
Platelets: 61 10*3/uL — ABNORMAL LOW (ref 150–400)
RBC: 3.4 MIL/uL — AB (ref 3.87–5.11)
RDW: 14.5 % (ref 11.5–15.5)
WBC: 2.2 10*3/uL — AB (ref 4.0–10.5)

## 2015-05-24 LAB — GLUCOSE, CAPILLARY
GLUCOSE-CAPILLARY: 294 mg/dL — AB (ref 65–99)
GLUCOSE-CAPILLARY: 311 mg/dL — AB (ref 65–99)
Glucose-Capillary: 178 mg/dL — ABNORMAL HIGH (ref 65–99)
Glucose-Capillary: 198 mg/dL — ABNORMAL HIGH (ref 65–99)

## 2015-05-24 LAB — AMMONIA: Ammonia: 54 umol/L — ABNORMAL HIGH (ref 9–35)

## 2015-05-24 MED ORDER — THIAMINE HCL 100 MG/ML IJ SOLN
100.0000 mg | Freq: Every day | INTRAMUSCULAR | Status: DC
  Filled 2015-05-24 (×2): qty 2

## 2015-05-24 MED ORDER — SERTRALINE HCL 50 MG PO TABS
150.0000 mg | ORAL_TABLET | Freq: Every day | ORAL | Status: DC
  Administered 2015-05-24 – 2015-05-27 (×4): 150 mg via ORAL
  Filled 2015-05-24 (×4): qty 1

## 2015-05-24 MED ORDER — SPIRONOLACTONE 25 MG PO TABS
100.0000 mg | ORAL_TABLET | Freq: Every day | ORAL | Status: DC
  Administered 2015-05-24 – 2015-05-25 (×2): 100 mg via ORAL
  Filled 2015-05-24 (×2): qty 4

## 2015-05-24 MED ORDER — DIPHENHYDRAMINE HCL 25 MG PO CAPS
25.0000 mg | ORAL_CAPSULE | Freq: Four times a day (QID) | ORAL | Status: DC | PRN
  Administered 2015-05-24: 25 mg via ORAL
  Filled 2015-05-24: qty 1

## 2015-05-24 MED ORDER — FUROSEMIDE 40 MG PO TABS
60.0000 mg | ORAL_TABLET | Freq: Every day | ORAL | Status: DC
  Administered 2015-05-24 – 2015-05-25 (×2): 60 mg via ORAL
  Filled 2015-05-24 (×2): qty 1

## 2015-05-24 MED ORDER — FOLIC ACID 1 MG PO TABS
1.0000 mg | ORAL_TABLET | Freq: Every day | ORAL | Status: DC
  Administered 2015-05-24 – 2015-05-27 (×4): 1 mg via ORAL
  Filled 2015-05-24 (×4): qty 1

## 2015-05-24 MED ORDER — INSULIN ASPART 100 UNIT/ML ~~LOC~~ SOLN
0.0000 [IU] | Freq: Three times a day (TID) | SUBCUTANEOUS | Status: DC
  Administered 2015-05-24: 2 [IU] via SUBCUTANEOUS
  Administered 2015-05-24: 7 [IU] via SUBCUTANEOUS
  Administered 2015-05-24: 2 [IU] via SUBCUTANEOUS

## 2015-05-24 MED ORDER — LORAZEPAM 1 MG PO TABS
1.0000 mg | ORAL_TABLET | Freq: Four times a day (QID) | ORAL | Status: AC | PRN
  Administered 2015-05-27: 1 mg via ORAL
  Filled 2015-05-24: qty 1

## 2015-05-24 MED ORDER — INSULIN ASPART 100 UNIT/ML ~~LOC~~ SOLN: 0.0000 [IU] | Freq: Every day | SUBCUTANEOUS | Status: DC

## 2015-05-24 MED ORDER — LORAZEPAM 2 MG/ML IJ SOLN
1.0000 mg | Freq: Four times a day (QID) | INTRAMUSCULAR | Status: AC | PRN
  Administered 2015-05-24: 1 mg via INTRAVENOUS
  Filled 2015-05-24: qty 1

## 2015-05-24 MED ORDER — LACTULOSE 10 GM/15ML PO SOLN
30.0000 g | Freq: Every day | ORAL | Status: DC
  Administered 2015-05-24 – 2015-05-27 (×4): 30 g via ORAL
  Filled 2015-05-24 (×4): qty 60

## 2015-05-24 MED ORDER — INSULIN GLARGINE 100 UNIT/ML ~~LOC~~ SOLN
20.0000 [IU] | Freq: Every day | SUBCUTANEOUS | Status: DC
  Administered 2015-05-24 – 2015-05-26 (×4): 20 [IU] via SUBCUTANEOUS
  Filled 2015-05-24 (×5): qty 0.2

## 2015-05-24 MED ORDER — VITAMIN B-1 100 MG PO TABS
100.0000 mg | ORAL_TABLET | Freq: Every day | ORAL | Status: DC
  Administered 2015-05-24 – 2015-05-27 (×4): 100 mg via ORAL
  Filled 2015-05-24 (×4): qty 1

## 2015-05-24 MED ORDER — AMOXICILLIN-POT CLAVULANATE 875-125 MG PO TABS
1.0000 | ORAL_TABLET | Freq: Two times a day (BID) | ORAL | Status: DC
  Administered 2015-05-24 – 2015-05-26 (×5): 1 via ORAL
  Filled 2015-05-24 (×5): qty 1

## 2015-05-24 MED ORDER — OXYCODONE HCL 5 MG PO TABS
5.0000 mg | ORAL_TABLET | ORAL | Status: DC | PRN
  Administered 2015-05-24 – 2015-05-27 (×16): 5 mg via ORAL
  Filled 2015-05-24 (×16): qty 1

## 2015-05-24 MED ORDER — METFORMIN HCL 500 MG PO TABS
500.0000 mg | ORAL_TABLET | Freq: Two times a day (BID) | ORAL | Status: DC
  Administered 2015-05-24: 500 mg via ORAL
  Filled 2015-05-24: qty 1

## 2015-05-24 NOTE — Hospital Discharge Follow-Up (Signed)
[facility]:  This Case Manager received call from Purcell Mouton, RN CM who indicated patient needing hospital follow-up appointment.  Patient does not have a PCP.  Appointment scheduled for 05/31/15 at 1100 with Dr. Jarold Song.  [facility] updated. Gabriel Earing, RN CM also updated.

## 2015-05-24 NOTE — Progress Notes (Signed)
[facility] Progress Note  Patient: [name] [id number]   PCP: Cathlean Cower, MD DOB: December 02, 1965   DOA: 05/23/2015   DOS: 05/24/2015   Date of Service: the patient was seen and examined on 05/24/2015  Subjective: Patient continues to complain of crampy abdominal pain. No nausea no vomiting no fever no diarrhea. She has constipation at her baseline. She has not been taking her medication because she ran out of them. Nutrition: Tolerating oral diet Activity: Ambulating in the room Last BM: Prior to arrival  Assessment and Plan: 1. Ascites Patient presents with complains of abdominal pain and also has abdominal distention. Unsure whether the patient has been compliant with all of her medications at home. Currently we will get ultrasound-guided paracentesis with lab work. Continuing Unasyn at present.  2. Cocaine abuse, alcohol dependence. Alcohol level significantly elevated on admission. Patient was counseled against use for further alcohol. Urine drug screen is also positive for cocaine. Avoiding beta blocker. Continue CIWA protocol. Holding IV narcotics and putting her on oral narcotics.  3. Protein calorie malnutrition. Currently remain nothing by mouth until the procedure. Advance diet as tolerated.  4. Type 2 diabetes mellitus. Unclear whether the patient is compliant with her insulin regimen at home as well. We will titrate insulin depending on her blood sugar with sliding scale.  5. Mood disorder. Continuing home medication.  6. History of cirrhosis. Resuming lactulose, continuing on Aldactone and Lasix.  DVT Prophylaxis: subcutaneous Heparin Nutrition: Nothing by mouth pending paracentesis Advance goals of care discussion: Full code  Brief Summary of Hospitalization:  HPI: As per the H and P dictated on admission, "[name] is a 50 y.o. female with a past medical history significant for hep C and alcoholic cirrhosis, ongoing EtOH, and IDDM who presents with  abdominal pain and ascites.  The patient was last admitted in early December, at which time she presented similarly with slowly progressive abdominal swelling, worsening abdominal pain, and tachycardia. She was evaluated by GI, had paracentesis of 7 L total (on 2 separate occasions) (negative for SBP), had a negative d-dimer because of the tachycardia which persisted throughout the hospitalization, and had a normal HIDA scan.  Since that hospitalization she has been back home, "mostly laying in bed". She reports adherence to both furosemide and spironolactone, but that the fluid in her belly has been slowly accumulating since discharge, and now the last 5 days she has developed worsening abdominal pain. This pain is severe, constant, and diffuse. It is associated with feeling short of breath, nausea, and feeling like she can't remember things. She denies vomiting, hematemesis, fever, chills.  In the ED, she was tachycardic but afebrile. There was hyperglycemia, she was intoxicated, and complained of a wound to her left foot for which she got a Td shot. TRH were asked to evaluate for admission and paracentesis" Daily update, Procedures: 05/24/2015 ultrasound-guided paracentesis ordered Consultants: None Antibiotics: Anti-infectives    Start     Dose/Rate Route Frequency Ordered Stop   05/24/15 1100  amoxicillin-clavulanate (AUGMENTIN) 875-125 MG per tablet 1 tablet     1 tablet Oral Every 12 hours 05/24/15 1000 06/03/15 0959      Family Communication: no  family was present at bedside, at the time of interview.   Disposition:  Expected discharge date: 05/25/2015 Barriers to safe discharge: Ultrasound-guided paracentesis  No intake or output data in the 24 hours ending 05/24/15 1213 Filed Weights   05/23/15 2053 05/24/15 0057  Weight: 49.584 kg (109 lb 5 oz)  49.442 kg (109 lb)    Objective: Physical Exam: Filed Vitals:   05/24/15 0030 05/24/15 0056 05/24/15 0057 05/24/15 0502  BP:  124/84 123/79  116/72  Pulse: 128 102  99  Temp:  98.6 F (37 C)  98.9 F (37.2 C)  TempSrc:  Oral  Oral  Resp: 20 20  20   Height:   5\' 2"  (1.575 m)   Weight:   49.442 kg (109 lb)   SpO2: 86% 94%  99%     General: Appear in mild distress, no Rash; Oral Mucosa moist. Cardiovascular: S1 and S2 Present, no Murmur, no JVD Respiratory: Bilateral Air entry present and Clear to Auscultation, no Crackles, no wheezes Abdomen: Bowel Sound present, Soft and no tenderness on pressing deeper with stethoscope on auscultation, but significant tenderness even with superficial palpation Extremities: no Pedal edema, no calf tenderness Neurology: Grossly no focal neuro deficit.  Data Reviewed: CBC:  Recent Labs Lab 05/23/15 2139 05/24/15 0231  WBC 2.9* 2.2*  NEUTROABS 1.6*  --   HGB 13.2 12.0  HCT 39.6 36.0  MCV 105.9* 105.9*  PLT 71* 61*   Basic Metabolic Panel:  Recent Labs Lab 05/23/15 2139 05/24/15 0231  NA 135 139  K 3.8 3.2*  CL 99* 100*  CO2 22 28  GLUCOSE 511* 289*  BUN 8 6  CREATININE 0.52 0.48  CALCIUM 8.8* 8.7*   Liver Function Tests:  Recent Labs Lab 05/23/15 2139 05/24/15 0231  AST 146* 130*  ALT 90* 79*  ALKPHOS 218* 181*  BILITOT 1.7* 1.5*  PROT 8.3* 7.8  ALBUMIN 3.3* 3.0*    Recent Labs Lab 05/23/15 2139  LIPASE 94*    Recent Labs Lab 05/24/15 0231  AMMONIA 54*    Cardiac Enzymes:  Recent Labs Lab 05/23/15 2139  TROPONINI <0.03    BNP (last 3 results)  Recent Labs  05/23/15 2139  BNP 21.8    CBG:  Recent Labs Lab 05/23/15 2051 05/24/15 0108 05/24/15 0802  GLUCAP 441* 294* 311*    No results found for this or any previous visit (from the past 240 hour(s)).   Studies: Dg Chest 2 View  05/23/2015  CLINICAL DATA:  Shortness of breath EXAM: CHEST  2 VIEW COMPARISON:  03/29/2015 FINDINGS: Heart size is normal. There is no pleural effusion or edema identified. No airspace consolidation noted. Mild spondylosis is present  within the thoracic spine. IMPRESSION: 1. No acute cardiopulmonary abnormalities. Electronically Signed   By: Kerby Moors M.D.   On: 05/23/2015 23:16     Scheduled Meds: . amoxicillin-clavulanate  1 tablet Oral Q12H  . folic acid  1 mg Oral Daily  . furosemide  60 mg Oral Daily  . [START ON 05/25/2015] insulin aspart  0-5 Units Subcutaneous QHS  . insulin aspart  0-9 Units Subcutaneous TID WC  . insulin glargine  20 Units Subcutaneous Q2200  . lactulose  30 g Oral Daily  . sertraline  150 mg Oral Daily  . spironolactone  100 mg Oral Daily  . thiamine  100 mg Oral Daily   Or  . thiamine  100 mg Intravenous Daily   Continuous Infusions:  PRN Meds: diphenhydrAMINE, LORazepam **OR** LORazepam, oxyCODONE  Time spent: 30 minutes  Author: Berle Mull, MD [facility] Pager: [phone] 05/24/2015 12:13 PM  If 7PM-7AM, please contact night-coverage at [email or web address], password [facility]

## 2015-05-24 NOTE — Progress Notes (Signed)
Inpatient Diabetes Program Recommendations  AACE/ADA: New Consensus Statement on Inpatient Glycemic Control (2015)  Target Ranges:  Prepandial:   less than 140 mg/dL      Peak postprandial:   less than 180 mg/dL (1-2 hours)      Critically ill patients:  140 - 180 mg/dL   Review of Glycemic Control  Diabetes history: DM2 Outpatient Diabetes medications: Lantus 20 units QHS, Novolog 3 units tidwc Current orders for Inpatient glycemic control: Lantus 20 units QHS, Novolog sensitive tidwc and hs  50 y.o. female with a past medical history significant for hep C and alcoholic cirrhosis, ongoing EtOH, and IDDM who presents with abdominal pain and ascites.   Results for KIARNA, HEGGER (MRN [id number]) as of 05/24/2015 19:34  Ref. Range 05/23/2015 21:39 05/24/2015 02:31  Glucose Latest Ref Range: 65-99 mg/dL 511 (H) 289 (H)   Results for TANDRIA, STAUP (MRN [id number]) as of 05/24/2015 19:34  Ref. Range 05/23/2015 20:51 05/24/2015 01:08 05/24/2015 08:02 05/24/2015 12:07 05/24/2015 17:21  Glucose-Capillary Latest Ref Range: 65-99 mg/dL 441 (H) 294 (H) 311 (H) 178 (H) 198 (H)   Doubtful pt is taking insulin as prescribed. Will talk with pt in am regarding injections, monitoring, obtaining PCP.  Inpatient Diabetes Program Recommendations:    Need updated HgbA1C. May benefit from meal coverage insulin - Novolog 3 units tidwc.  Will see pt in am. Thank you. Lorenda Peck, RD, LDN, CDE Inpatient Diabetes Coordinator [phone]

## 2015-05-24 NOTE — Progress Notes (Signed)
Unable to do paracentesis today due to minimal abdominal fluid.  Reorder if necessary.  Brandn Mcgath E 4:23 PM 05/24/2015

## 2015-05-25 DIAGNOSIS — K7031 Alcoholic cirrhosis of liver with ascites: Secondary | ICD-10-CM | POA: Diagnosis not present

## 2015-05-25 DIAGNOSIS — F1029 Alcohol dependence with unspecified alcohol-induced disorder: Secondary | ICD-10-CM | POA: Diagnosis not present

## 2015-05-25 DIAGNOSIS — R188 Other ascites: Secondary | ICD-10-CM | POA: Diagnosis not present

## 2015-05-25 DIAGNOSIS — F1410 Cocaine abuse, uncomplicated: Secondary | ICD-10-CM | POA: Diagnosis not present

## 2015-05-25 LAB — COMPREHENSIVE METABOLIC PANEL
ALK PHOS: 165 U/L — AB (ref 38–126)
ALT: 73 U/L — AB (ref 14–54)
ANION GAP: 8 (ref 5–15)
AST: 115 U/L — ABNORMAL HIGH (ref 15–41)
Albumin: 2.8 g/dL — ABNORMAL LOW (ref 3.5–5.0)
BILIRUBIN TOTAL: 2.4 mg/dL — AB (ref 0.3–1.2)
BUN: 10 mg/dL (ref 6–20)
CALCIUM: 8.3 mg/dL — AB (ref 8.9–10.3)
CO2: 32 mmol/L (ref 22–32)
CREATININE: 0.44 mg/dL (ref 0.44–1.00)
Chloride: 91 mmol/L — ABNORMAL LOW (ref 101–111)
Glucose, Bld: 371 mg/dL — ABNORMAL HIGH (ref 65–99)
Potassium: 3.2 mmol/L — ABNORMAL LOW (ref 3.5–5.1)
Sodium: 131 mmol/L — ABNORMAL LOW (ref 135–145)
TOTAL PROTEIN: 7.4 g/dL (ref 6.5–8.1)

## 2015-05-25 LAB — CBC WITH DIFFERENTIAL/PLATELET
Basophils Absolute: 0 10*3/uL (ref 0.0–0.1)
Basophils Relative: 1 %
Eosinophils Absolute: 0.1 10*3/uL (ref 0.0–0.7)
Eosinophils Relative: 2 %
HEMATOCRIT: 37.1 % (ref 36.0–46.0)
HEMOGLOBIN: 12.8 g/dL (ref 12.0–15.0)
LYMPHS ABS: 0.4 10*3/uL — AB (ref 0.7–4.0)
LYMPHS PCT: 20 %
MCH: 35.5 pg — AB (ref 26.0–34.0)
MCHC: 34.5 g/dL (ref 30.0–36.0)
MCV: 102.8 fL — AB (ref 78.0–100.0)
MONOS PCT: 16 %
Monocytes Absolute: 0.4 10*3/uL (ref 0.1–1.0)
NEUTROS PCT: 62 %
Neutro Abs: 1.4 10*3/uL — ABNORMAL LOW (ref 1.7–7.7)
Platelets: 59 10*3/uL — ABNORMAL LOW (ref 150–400)
RBC: 3.61 MIL/uL — AB (ref 3.87–5.11)
RDW: 13.9 % (ref 11.5–15.5)
WBC: 2.2 10*3/uL — AB (ref 4.0–10.5)

## 2015-05-25 LAB — GLUCOSE, CAPILLARY
GLUCOSE-CAPILLARY: 311 mg/dL — AB (ref 65–99)
GLUCOSE-CAPILLARY: 406 mg/dL — AB (ref 65–99)
GLUCOSE-CAPILLARY: 318 mg/dL — AB (ref 65–99)
GLUCOSE-CAPILLARY: 375 mg/dL — AB (ref 65–99)
Glucose-Capillary: 208 mg/dL — ABNORMAL HIGH (ref 65–99)

## 2015-05-25 LAB — CANCER ANTIGEN 19-9: CA 19-9: 84 U/mL — ABNORMAL HIGH (ref 0–35)

## 2015-05-25 MED ORDER — FUROSEMIDE 40 MG PO TABS
40.0000 mg | ORAL_TABLET | Freq: Two times a day (BID) | ORAL | Status: DC
  Administered 2015-05-25 – 2015-05-27 (×4): 40 mg via ORAL
  Filled 2015-05-25 (×4): qty 1

## 2015-05-25 MED ORDER — RIFAXIMIN 550 MG PO TABS
550.0000 mg | ORAL_TABLET | Freq: Two times a day (BID) | ORAL | Status: DC
  Administered 2015-05-25 – 2015-05-27 (×5): 550 mg via ORAL
  Filled 2015-05-25 (×6): qty 1

## 2015-05-25 MED ORDER — INSULIN ASPART 100 UNIT/ML ~~LOC~~ SOLN
0.0000 [IU] | Freq: Three times a day (TID) | SUBCUTANEOUS | Status: DC
  Administered 2015-05-25: 3 [IU] via SUBCUTANEOUS
  Administered 2015-05-25: 15 [IU] via SUBCUTANEOUS
  Administered 2015-05-25: 11 [IU] via SUBCUTANEOUS
  Administered 2015-05-26: 8 [IU] via SUBCUTANEOUS
  Administered 2015-05-26: 2 [IU] via SUBCUTANEOUS
  Administered 2015-05-27: 15 [IU] via SUBCUTANEOUS

## 2015-05-25 MED ORDER — SPIRONOLACTONE 25 MG PO TABS
100.0000 mg | ORAL_TABLET | Freq: Two times a day (BID) | ORAL | Status: DC
  Administered 2015-05-25 – 2015-05-27 (×4): 100 mg via ORAL
  Filled 2015-05-25 (×4): qty 4

## 2015-05-25 MED ORDER — INSULIN ASPART 100 UNIT/ML ~~LOC~~ SOLN
2.0000 [IU] | Freq: Once | SUBCUTANEOUS | Status: AC
  Administered 2015-05-25: 2 [IU] via SUBCUTANEOUS

## 2015-05-25 MED ORDER — INSULIN ASPART 100 UNIT/ML ~~LOC~~ SOLN
0.0000 [IU] | Freq: Every day | SUBCUTANEOUS | Status: DC
  Administered 2015-05-25: 5 [IU] via SUBCUTANEOUS

## 2015-05-25 NOTE — Progress Notes (Signed)
[facility] Progress Note  Patient: [name] [id number]   PCP: Cathlean Cower, MD DOB: 06-30-1965   DOA: 05/23/2015   DOS: 05/25/2015   Date of Service: the patient was seen and examined on 05/25/2015  Subjective: Patient continued to have some abdominal pain. No nausea no vomiting tolerate oral diet. Cannot complete paracentesis yesterday she did not have any significant fluid to drain. Nutrition: Tolerating oral diet Activity: Ambulating in the room Last BM: 05/24/2015  Assessment and Plan: 1. Ascites Patient presents with complains of abdominal pain and also has abdominal distention. Unsure whether the patient has been compliant with all of her medications at home. Appreciate input from GI. ultrasound-guided paracentesis with lab work. Continuing Augmentin at present.  2. Cocaine abuse, alcohol dependence. Alcohol level significantly elevated on admission. Patient was counseled against use for further alcohol. Urine drug screen is also positive for cocaine. Avoiding beta blocker. Continue CIWA protocol. Holding IV narcotics and putting her on oral narcotics.  3. Protein calorie malnutrition. Currently remain nothing by mouth until the procedure. Advance diet as tolerated.  4. Type 2 diabetes mellitus. Unclear whether the patient is compliant with her insulin regimen at home as well. We will titrate insulin depending on her blood sugar with sliding scale.  5. Mood disorder. Continuing home medication.  6. History of cirrhosis. Resuming lactulose, continuing on Aldactone and Lasix. CA-19-9 elevated. We will follow recommendation from the gastroenterology.  DVT Prophylaxis: subcutaneous Heparin Nutrition: Nothing by mouth pending paracentesis Advance goals of care discussion: Full code  Brief Summary of Hospitalization:  HPI: As per the H and P dictated on admission, "[name] is a 50 y.o. female with a past medical history significant for hep C and alcoholic  cirrhosis, ongoing EtOH, and IDDM who presents with abdominal pain and ascites.  The patient was last admitted in early December, at which time she presented similarly with slowly progressive abdominal swelling, worsening abdominal pain, and tachycardia. She was evaluated by GI, had paracentesis of 7 L total (on 2 separate occasions) (negative for SBP), had a negative d-dimer because of the tachycardia which persisted throughout the hospitalization, and had a normal HIDA scan.  Since that hospitalization she has been back home, "mostly laying in bed". She reports adherence to both furosemide and spironolactone, but that the fluid in her belly has been slowly accumulating since discharge, and now the last 5 days she has developed worsening abdominal pain. This pain is severe, constant, and diffuse. It is associated with feeling short of breath, nausea, and feeling like she can't remember things. She denies vomiting, hematemesis, fever, chills.  In the ED, she was tachycardic but afebrile. There was hyperglycemia, she was intoxicated, and complained of a wound to her left foot for which she got a Td shot. TRH were asked to evaluate for admission and paracentesis" Daily update, Procedures: 05/24/2015 ultrasound-guided paracentesis could not be completed. reordered on 05/26/2015 Consultants: Gastroenterology Antibiotics: Anti-infectives    Start     Dose/Rate Route Frequency Ordered Stop   05/25/15 1430  rifaximin (XIFAXAN) tablet 550 mg     550 mg Oral 2 times daily 05/25/15 1336     05/24/15 1100  amoxicillin-clavulanate (AUGMENTIN) 875-125 MG per tablet 1 tablet     1 tablet Oral Every 12 hours 05/24/15 1000 06/03/15 0959      Family Communication: no  family was present at bedside, at the time of interview.   Disposition:  Expected discharge date: 05/27/2015 Barriers to safe discharge: Ultrasound-guided paracentesis  and further GI workup   Intake/Output Summary (Last 24 hours) at 05/25/15  1755 Last data filed at 05/25/15 1421  Gross per 24 hour  Intake    960 ml  Output      0 ml  Net    960 ml   [facility] Weights   05/23/15 2053 05/24/15 0057  Weight: 49.584 kg (109 lb 5 oz) 49.442 kg (109 lb)    Objective: Physical Exam: Filed Vitals:   05/24/15 1355 05/24/15 2121 05/25/15 0633 05/25/15 1438  BP: 140/80 127/84 119/75 124/82  Pulse: 85 104 99 102  Temp: 98.1 F (36.7 C) 98.8 F (37.1 C) 98.4 F (36.9 C) 98.9 F (37.2 C)  TempSrc: Oral Oral Oral Oral  Resp: 20 22 20 18   Height:      Weight:      SpO2: 98% 95% 95% 97%     General: Appear in mild distress, no Rash; Oral Mucosa moist. Cardiovascular: S1 and S2 Present, no Murmur, no JVD Respiratory: Bilateral Air entry present and Clear to Auscultation, no Crackles, no wheezes Abdomen: Bowel Sound present, Soft and no tenderness on pressing deeper with stethoscope on auscultation, but significant tenderness even with superficial palpation Extremities: no Pedal edema, no calf tenderness Neurology: Grossly no focal neuro deficit. No asterixis   Data Reviewed: CBC:  Recent Labs Lab 05/23/15 2139 05/24/15 0231 05/25/15 0444  WBC 2.9* 2.2* 2.2*  NEUTROABS 1.6*  --  1.4*  HGB 13.2 12.0 12.8  HCT 39.6 36.0 37.1  MCV 105.9* 105.9* 102.8*  PLT 71* 61* 59*   Basic Metabolic Panel:  Recent Labs Lab 05/23/15 2139 05/24/15 0231 05/25/15 0444  NA 135 139 131*  K 3.8 3.2* 3.2*  CL 99* 100* 91*  CO2 22 28 32  GLUCOSE 511* 289* 371*  BUN 8 6 10   CREATININE 0.52 0.48 0.44  CALCIUM 8.8* 8.7* 8.3*   Liver Function Tests:  Recent Labs Lab 05/23/15 2139 05/24/15 0231 05/25/15 0444  AST 146* 130* 115*  ALT 90* 79* 73*  ALKPHOS 218* 181* 165*  BILITOT 1.7* 1.5* 2.4*  PROT 8.3* 7.8 7.4  ALBUMIN 3.3* 3.0* 2.8*    Recent Labs Lab 05/23/15 2139  LIPASE 94*    Recent Labs Lab 05/24/15 0231  AMMONIA 54*    Cardiac Enzymes:  Recent Labs Lab 05/23/15 2139  TROPONINI <0.03    BNP (last 3  results)  Recent Labs  05/23/15 2139  BNP 21.8    CBG:  Recent Labs Lab 05/24/15 1721 05/24/15 2133 05/25/15 0735 05/25/15 1146 05/25/15 1701  GLUCAP 198* 311* 406* 208* 318*    No results found for this or any previous visit (from the past 240 hour(s)).   Studies: No results found.   Scheduled Meds: . amoxicillin-clavulanate  1 tablet Oral Q12H  . folic acid  1 mg Oral Daily  . furosemide  40 mg Oral BID  . insulin aspart  0-15 Units Subcutaneous TID WC  . insulin aspart  0-5 Units Subcutaneous QHS  . insulin glargine  20 Units Subcutaneous Q2200  . lactulose  30 g Oral Daily  . rifaximin  550 mg Oral BID  . sertraline  150 mg Oral Daily  . spironolactone  100 mg Oral BID  . thiamine  100 mg Oral Daily   Or  . thiamine  100 mg Intravenous Daily   Continuous Infusions:  PRN Meds: diphenhydrAMINE, LORazepam **OR** LORazepam, oxyCODONE  Time spent: 30 minutes  Author: Berle Mull, MD [facility] Pager: [phone] 05/25/2015 5:55 PM  If 7PM-7AM, please contact night-coverage at [email or web address], password [facility]

## 2015-05-25 NOTE — Consult Note (Signed)
Referring Provider:  [facility] Primary Care Physician:  Cathlean Cower, MD Primary Gastroenterologist:  Former Dr. Deatra Ina patient, met Dr. Fuller Plan while in patient 03/2015  Reason for Consultation:   Abdominal pain      HPI: [name] is a 50 y.o. female with a history of [place] cirrhosis, portal hypertension, and hepatitis C. She is alcoholic and has actively been drinking and states she drank several shots of vodka the day of admission. She was recently admitted December 6 through 04/03/2015 due to ascites, thrombocytopenia, poorly controlled diabetes, and generalized abdominal pain. She was continued on higher dose Lasix and spironolactone and was noted to have good urine output and stable renal function. She had an ultrasound guided paracentesis on December 7 yielding 3.7 L of fluid, and repeat paracentesis on December 9 yielding 2 L of fluid. Pathology from the ascitic fluid revealed reactive cells with no evidence for malignancy. She was noted to have a right nondisplaced seventh rib fracture at the time that she felt was due to a recent fall at the police station. She had been noted to have abnormal LFTs and a HIDA scan was performed which was unremarkable. She was instructed to follow-up in the GI office, but never did so. Apparently she reported to the hospitalist on admission that she was advised that the office did not except Medicaid. That is not the case. Patient canceled the appointment she had in August 2016, no showed for an appointment in September, and no showed for an appointment in December. Since her hospitalization in December patient claims she has been adherent to both spironolactone and furosemide but has experienced progressive reaccumulation of fluid. For several days prior to this readmission she was experiencing worsening and abdominal pain. She has had some shortness of breath and intermittent nausea. She states she has been having difficulty with her memory. She has not  been using her lactulose as directed. She denies fever, chills, night sweats, vomiting, or hematemesis.  Chart review reveals that she had been evaluated in the hospital in July 2016 by Dr. Delfin Edis. She had known gallstones on ultrasound with a 5 mm gallbladder wall, common bile duct 6 mm and 18 % EF on HIDA in October 2015. She then had normal EF March 2016. She had an MRI 11/12/2014 that showed evidence of cirrhosis with trace perihepatic ascites and splenomegaly which may indicate an element of portal hypertension. There was noted to be interval development of right hepatic lobe capsule retraction about a background of contour nodularity with ill-defined early enhancement in this area but no measurable mass. This raises the question of laryngeal carcinoma. The pancreas appeared normal on the MRCP. CA-19-9 6 months ago with 62, and yesterday was noted to be up to 84. AFP in December was 4.2. Ultrasound in 05/24/2015 was positive for moderate volume ascites. Patient was seen by interventional radiology for paracentesis but it was felt not enough fluid was present for paracentesis. Patient feels her abdomen is more distended today than it has been yesterday. As stated above she continues to drink. She denies use of recreational drugs, but urine rapid drug screen on this admission was positive for cocaine. Ammonia yesterday was 54. We have been asked to evaluate patient due to abdominal pain and elevated CA-19-9.  She cancelled 11/2014 office appt with [facility] and has 'no showed' twice to our office since then   Past Medical History  Diagnosis Date  . Impaired glucose tolerance 11/07/2010  . Hepatitis C approx  dx 2005    no tx to date  . Pancreatitis     HX of  . Alcohol dependency ([place])     at least [place] admissions 2007- 09/2013 for detox.   [facility] Kitchen GERD (gastroesophageal reflux disease)   . Chronic constipation   . Gallstones   . Drug dependency ([place])     Hx of cocaine use  . Anxiety and  depression 08/2011  . Rheumatoid factor positive   . Hemochromatosis     last phlebotomy tx ~ 2012. liver bx 2006  . Pancytopenia   . Abnormal vaginal bleeding     uterine fibroid  . Gallstone 2006  . Chronic abdominal pain 2011  . Cirrhosis ([place]) 11/09/2010  . Type II or unspecified type diabetes mellitus without mention of complication, uncontrolled 09/17/2011  . Major depression ([place]) 11/2012, 09/2013    [facility] admissions for this, suicide attempt by OD Ambien (2014), Prozac (2015),ETOHism   . Anxiety     Past Surgical History  Procedure Laterality Date  . Appendectomy    . Cesarean section  x 2  . Foot surgery  2013  . Abdominal hysterectomy  12/17/2011    Procedure: HYSTERECTOMY ABDOMINAL;  Surgeon: Gus Height, MD;  Location: [facility];  Service: Gynecology;  Laterality: N/A;  . Salpingoophorectomy  12/17/2011    Procedure: SALPINGO OOPHERECTOMY;  Surgeon: Gus Height, MD;  Location: [facility];  Service: Gynecology;  Laterality: Bilateral;  . Esophagogastroduodenoscopy N/A 12/10/2013    Procedure: ESOPHAGOGASTRODUODENOSCOPY (EGD);  Surgeon: Ladene Artist, MD;  Location: [facility];  Service: Endoscopy;  Laterality: N/A;  . Percutaneous liver biopsy  2011  . Colonoscopy N/A 02/18/2014    Procedure: COLONOSCOPY;  Surgeon: Gatha Mayer, MD;  Location: [facility];  Service: Endoscopy;  Laterality: N/A;    Prior to Admission medications   Medication Sig Start Date End Date Taking? Authorizing Provider  calcium carbonate (TUMS EX) 750 MG chewable tablet Chew 3 tablets by mouth 4 (four) times daily as needed for heartburn.   Yes Historical Provider, MD  diphenhydrAMINE (BENADRYL) 25 MG tablet Take 100 mg by mouth at bedtime.    Yes Historical Provider, MD  ibuprofen (ADVIL,MOTRIN) 200 MG tablet Take 200 mg by mouth 4 (four) times daily.   Yes Historical Provider, MD  Insulin Glargine (LANTUS SOLOSTAR) 100 UNIT/ML Solostar Pen Inject 18 Units into the skin daily at 10 pm. Patient taking  differently: Inject 20 Units into the skin daily at 10 pm.  04/26/15  Yes Biagio Borg, MD  KLOR-CON M10 10 MEQ tablet Take 10 mEq by mouth daily.  11/15/14  Yes Historical Provider, MD  metFORMIN (GLUCOPHAGE) 500 MG tablet TAKE 1 TABLET (500 MG TOTAL) BY MOUTH 2 (TWO) TIMES DAILY WITH A MEAL. 05/22/15  Yes Biagio Borg, MD  Multiple Vitamin (MULTIVITAMIN WITH MINERALS) TABS tablet Take 1 tablet by mouth daily.    Yes Historical Provider, MD  NOVOLOG FLEXPEN 100 UNIT/ML FlexPen INJECT 2 UNITS INTO SKIN 3 TIMES DAILY BEFORE MEALS Patient taking differently: INJECT 6 UNITS INTO SKIN 3 TIMES DAILY BEFORE MEALS 01/13/15  Yes Biagio Borg, MD  sertraline (ZOLOFT) 100 MG tablet Take 150 mg by mouth daily.   Yes Historical Provider, MD  spironolactone (ALDACTONE) 100 MG tablet Take 1 tablet (100 mg total) by mouth daily. 04/03/15  Yes Donne Hazel, MD  docusate sodium (COLACE) 100 MG capsule Take 1 capsule (100 mg total) by mouth 2 (two) times daily. Patient not  taking: Reported on 05/23/2015 11/15/14   Nat Math, MD  furosemide (LASIX) 20 MG tablet Take 3 tablets (60 mg total) by mouth daily. Patient not taking: Reported on 05/23/2015 04/03/15   Donne Hazel, MD  HYDROmorphone (DILAUDID) 2 MG tablet Take 0.5 tablets (1 mg total) by mouth every 6 (six) hours as needed for severe pain. Patient not taking: Reported on 05/23/2015 04/03/15   Donne Hazel, MD  ibuprofen (ADVIL,MOTRIN) 400 MG tablet Take 1 tablet (400 mg total) by mouth every 6 (six) hours as needed. Patient not taking: Reported on 05/23/2015 03/18/15   Nona Dell, PA-C  insulin aspart (NOVOLOG) 100 UNIT/ML injection Inject 3 Units into the skin 3 (three) times daily before meals. Patient not taking: Reported on 05/23/2015 07/26/14   Belkys A Regalado, MD  lactulose (CHRONULAC) 10 GM/15ML solution Take 45 mLs (30 g total) by mouth daily. Patient not taking: Reported on 03/28/2015 01/20/15   Biagio Borg, MD  LANTUS 100 UNIT/ML  injection INJECT 0.2 MLS (20 UNITS TOTAL) INTO THE SKIN AT BEDTIME. Patient not taking: Reported on 05/23/2015 05/01/15   Biagio Borg, MD  methocarbamol (ROBAXIN) 500 MG tablet Take 1 tablet (500 mg total) by mouth every 8 (eight) hours as needed for muscle spasms (neck spasm/pain). Patient not taking: Reported on 05/23/2015 04/03/15   Donne Hazel, MD    Current Facility-Administered Medications  Medication Dose Route Frequency Provider Last Rate Last Dose  . amoxicillin-clavulanate (AUGMENTIN) 875-125 MG per tablet 1 tablet  1 tablet Oral Q12H Ezequiel Essex, MD   1 tablet at 05/25/15 1032  . diphenhydrAMINE (BENADRYL) capsule 25 mg  25 mg Oral Q6H PRN Lavina Hamman, MD   25 mg at 05/24/15 1024  . folic acid (FOLVITE) tablet 1 mg  1 mg Oral Daily Edwin Dada, MD   1 mg at 05/25/15 1031  . furosemide (LASIX) tablet 60 mg  60 mg Oral Daily Edwin Dada, MD   60 mg at 05/25/15 1031  . insulin aspart (novoLOG) injection 0-15 Units  0-15 Units Subcutaneous TID WC Lavina Hamman, MD   15 Units at 05/25/15 0756  . insulin aspart (novoLOG) injection 0-5 Units  0-5 Units Subcutaneous QHS Lavina Hamman, MD      . insulin glargine (LANTUS) injection 20 Units  20 Units Subcutaneous Q2200 Edwin Dada, MD   20 Units at 05/24/15 2201  . lactulose (CHRONULAC) 10 GM/15ML solution 30 g  30 g Oral Daily Edwin Dada, MD   30 g at 05/25/15 1029  . LORazepam (ATIVAN) tablet 1 mg  1 mg Oral Q6H PRN Edwin Dada, MD       Or  . LORazepam (ATIVAN) injection 1 mg  1 mg Intravenous Q6H PRN Edwin Dada, MD   1 mg at 05/24/15 2200  . oxyCODONE (Oxy IR/ROXICODONE) immediate release tablet 5 mg  5 mg Oral Q3H PRN Lavina Hamman, MD   5 mg at 05/25/15 1035  . sertraline (ZOLOFT) tablet 150 mg  150 mg Oral Daily Edwin Dada, MD   150 mg at 05/25/15 1030  . spironolactone (ALDACTONE) tablet 100 mg  100 mg Oral Daily Edwin Dada, MD   100 mg at  05/25/15 1031  . thiamine (VITAMIN B-1) tablet 100 mg  100 mg Oral Daily Edwin Dada, MD   100 mg at 05/25/15 1043   Or  . thiamine (B-1) injection 100 mg  100  mg Intravenous Daily Edwin Dada, MD        Allergies as of 05/23/2015 - Review Complete 05/23/2015  Allergen Reaction Noted  . Betadine [povidone iodine] Hives 12/24/2013    Family History  Problem Relation Age of Onset  . Cancer Mother     ovarian  . Diabetes Father     Social History   Social History  . Marital Status: Married    Spouse Name: N/A  . Number of Children: 3  . Years of Education: N/A   Occupational History  . formed [employer]     now staying home   Social History Main Topics  . Smoking status: Never Smoker   . Smokeless tobacco: Never Used  . Alcohol Use: Yes     Comment: one pint vodka daily years  . Drug Use: No     Comment: denied all drugs during admission  . Sexual Activity: Not Currently    Birth Control/ Protection: None   Other Topics Concern  . Not on file   Social History Narrative    Review of Systems: Gen: Denies any fever or chills. Admits to confusion CV: Denies chest pain, angina, palpitations, syncope, orthopnea, PND, peripheral edema, and claudication. Resp: Denies dyspnea at rest, dyspnea with exercise, cough, sputum, wheezing, coughing up blood, and pleurisy. GI: Denies vomiting blood, jaundice, and fecal incontinence.   Denies dysphagia or odynophagia. Admits to abdominal pain and distention GU : Denies urinary burning, blood in urine, urinary frequency, urinary hesitancy, nocturnal urination, and urinary incontinence. MS: Denies joint pain, limitation of movement, and swelling, stiffness, low back pain, extremity pain. Denies muscle weakness, cramps, atrophy.  Derm: Denies rash, itching, dry skin, hives, moles, warts, or unhealing ulcers.  Psych: Admits to memory loss and confusion Heme: Denies  enlarged lymph nodes. Neuro:  Denies any  headaches, dizziness, paresthesias. Endo:  Denies any problems with thyroid, adrenal function.  Physical Exam: Vital signs in last 24 hours: Temp:  [98.1 F (36.7 C)-98.8 F (37.1 C)] 98.4 F (36.9 C) (02/02 3532) Pulse Rate:  [85-104] 99 (02/02 0633) Resp:  [20-22] 20 (02/02 0633) BP: (119-140)/(75-84) 119/75 mmHg (02/02 0633) SpO2:  [95 %-98 %] 95 % (02/02 9924) Last BM Date: 05/24/15 (Per patient) General:   Alert,  thin female, pleasant and cooperative in NAD Head:  Normocephalic and atraumatic. Eyes:  Sclera clear, no icterus.   Conjunctiva pink. Ears:  Normal auditory acuity. Nose:  No deformity, discharge,  or lesions. Mouth:  No deformity or lesions.   Neck:  Supple; no masses or thyromegaly. Lungs:  Clear throughout to auscultation.     Heart:  Regular rate and rhythm; no murmurs, clicks, rubs,  or gallops. Abdomen:  Distended, taut, diffusely tender to palpation. Positive bowel sounds.  Rectal:  Deferred  Msk:  Symmetrical without gross deformities. . Pulses:  Normal pulses noted. Extremities:  Without clubbing or edema. Neurologic:  Alert and  oriented x4;  grossly normal neurologically. No asterixis Skin:  Intact without significant lesions or rashes.. Psych:  Alert and cooperative. Normal mood and affect.  Intake/Output from previous day: 02/01 0701 - 02/02 0700 In: 240 [P.O.:240] Out: -  Intake/Output this shift:    Lab Results:  Recent Labs  05/23/15 2139 05/24/15 0231 05/25/15 0444  WBC 2.9* 2.2* 2.2*  HGB 13.2 12.0 12.8  HCT 39.6 36.0 37.1  PLT 71* 61* 59*   BMET  Recent Labs  05/23/15 2139 05/24/15 0231 05/25/15 0444  NA 135 139 131*  K 3.8 3.2* 3.2*  CL 99* 100* 91*  CO2 22 28 32  GLUCOSE 511* 289* 371*  BUN _0 CREATININE 0.52 0.48 0.44  CALCIUM 8.8* 8.7* 8.3*   LFT  Recent Labs  05/25/15 0444  PROT 7.4  ALBUMIN 2.8*  AST 115*  ALT 73*  ALKPHOS 165*  BILITOT 2.4*   PT/INR  Recent Labs  05/23/15 2139  LABPROT  15.8*  INR 1.24    Studies/Results: Dg Chest 2 View  05/23/2015  CLINICAL DATA:  Shortness of breath EXAM: CHEST  2 VIEW COMPARISON:  03/29/2015 FINDINGS: Heart size is normal. There is no pleural effusion or edema identified. No airspace consolidation noted. Mild spondylosis is present within the thoracic spine. IMPRESSION: 1. No acute cardiopulmonary abnormalities. Electronically Signed   By: Kerby Moors M.D.   On: 05/23/2015 23:16   US Abdomen Limited  05/24/2015  CLINICAL DATA:  50 year old female with a possible ascites EXAM: LIMITED ABDOMEN ULTRASOUND FOR ASCITES TECHNIQUE: Limited ultrasound survey for ascites was performed in all four abdominal quadrants. COMPARISON:  Prior paracentesis 03/31/2015 FINDINGS: Sonographic interrogation of the 4 quadrants of the abdomen demonstrates moderate sonographically simple ascites. IMPRESSION: Positive for moderate volume ascites. Electronically Signed   By: Jacqulynn Cadet M.D.   On: 05/24/2015 16:41    IMPRESSION/PLAN: 50 year old female with a history of decompensated cirrhosis, portal hypertension, hepatitis C, readmitted with increasing ascites and abdominal pain. Will order a paracentesis for tomorrow morning. She has been noted in July to have a questionable lesion in the right lobe of the liver which may be a cirrhotic nodule not appreciated on CT at the time, or possibly developing [place] or cholangiocarcinoma. AFP in December was 4.8, CA-19-9 currently 84. CT in December had patent portal, splenic, hepatic and renal veins with stable small periumbilical varices.  Abdominal pain likely related to her ascites. Will have fluid sent for cell count, cytology, culture etc. tomorrow. Continue spironolactone and furosemide.CIWA protocol.   Hvozdovic, Deloris Ping 05/25/2015,  Pager [phone]  Mon-Fri 8a-5p [phone] after 5p, weekends, holidays  ________________________________________________________________________  [facility] MD note:  I  personally examined the patient, reviewed the data and agree with the assessment and plan described above.  She had alcohol related cirrhosis, continues to drink and was cocaine+ on admitting urine tox screen.  Absolutely best for her would be if she could never drink etoh or do drugs again.  She should be offered any services while in hospital that could help with that.  She has failed to show for three outpatient appointments in our office since 11/2014, says we told her we couldn't see her because she's on medicaid but that is not true.  Effective outpatient management of her ascites would likely decrease future hospitalizations.    Abnormal area of liver on July 2016 MRI should be followed up with repeat MRI now.  We have ordered that.  I recommend against any further tumor marker testing (ca 19-9, afp, etc). Their sensitivity and specificity are just too low to be very helpful in this setting.  Her abd is pretty tense today, likely from ascites (had moderate ascites by [place] yesterday, not tapped). We've ordered repeat paracentesis for comfort and to check for underlying SBP.  Currently on 100 aldactone and 60 lasix orally daily.  I'm going to increase her aldactone to 200 and increase her lasix to 80.  She asked for increase in her pain meds, I will leave that to primary team.  Owens Loffler, MD [facility] Pager [phone]

## 2015-05-26 ENCOUNTER — Observation Stay (HOSPITAL_COMMUNITY): Payer: Medicaid Other

## 2015-05-26 DIAGNOSIS — R188 Other ascites: Secondary | ICD-10-CM | POA: Diagnosis not present

## 2015-05-26 DIAGNOSIS — R1011 Right upper quadrant pain: Secondary | ICD-10-CM

## 2015-05-26 DIAGNOSIS — K7031 Alcoholic cirrhosis of liver with ascites: Secondary | ICD-10-CM | POA: Diagnosis not present

## 2015-05-26 DIAGNOSIS — F1029 Alcohol dependence with unspecified alcohol-induced disorder: Secondary | ICD-10-CM | POA: Diagnosis not present

## 2015-05-26 LAB — BASIC METABOLIC PANEL
ANION GAP: 8 (ref 5–15)
BUN: 11 mg/dL (ref 6–20)
CALCIUM: 9.1 mg/dL (ref 8.9–10.3)
CO2: 32 mmol/L (ref 22–32)
Chloride: 92 mmol/L — ABNORMAL LOW (ref 101–111)
Creatinine, Ser: 0.51 mg/dL (ref 0.44–1.00)
GFR calc Af Amer: >60 mL/min (ref >60)
GFR calc non Af Amer: >60 mL/min (ref >60)
GLUCOSE: 320 mg/dL — AB (ref 65–99)
Potassium: 3.7 mmol/L (ref 3.5–5.1)
Sodium: 132 mmol/L — ABNORMAL LOW (ref 135–145)

## 2015-05-26 LAB — GLUCOSE, CAPILLARY
GLUCOSE-CAPILLARY: 123 mg/dL — AB (ref 65–99)
Glucose-Capillary: 275 mg/dL — ABNORMAL HIGH (ref 65–99)
Glucose-Capillary: 115 mg/dL — ABNORMAL HIGH (ref 65–99)
Glucose-Capillary: 159 mg/dL — ABNORMAL HIGH (ref 65–99)

## 2015-05-26 LAB — LIPASE, BLOOD: Lipase: 55 U/L — ABNORMAL HIGH (ref 11–51)

## 2015-05-26 MED ORDER — GADOXETATE DISODIUM 0.25 MMOL/ML IV SOLN
5.0000 mL | Freq: Once | INTRAVENOUS | Status: AC | PRN
  Administered 2015-05-26: 5 mL via INTRAVENOUS

## 2015-05-26 MED ORDER — INSULIN ASPART 100 UNIT/ML ~~LOC~~ SOLN: 3.0000 [IU] | Freq: Three times a day (TID) | SUBCUTANEOUS | Status: DC

## 2015-05-26 NOTE — Progress Notes (Signed)
Patient ID: [name], female   DOB: 1965/12/14, 50 y.o.   MRN: [id number] Pt presented to [place] dept today for paracentesis. On limited ultrasound of abdomen in all 4 quadrants there is only a trace amount of ascites present which is not safely accessible for percutaneous aspiration at this time secondary to surrounding bowel loops. Procedure was canceled. Patient informed. Can reassess for increase in ascites in a few days if necessary.

## 2015-05-26 NOTE — Progress Notes (Signed)
[facility] Progress Note  Patient: [name] [id number]   PCP: Cathlean Cower, MD DOB: October 13, 1965   DOA: 05/23/2015   DOS: 05/26/2015   Date of Service: the patient was seen and examined on 05/26/2015  Subjective: Patient went for paracentesis today again and could not complete the procedure since there was no significant fluid to be drained. Scheduled for MRI later. Continues to have abdominal pain. Had 4 loose watery bowel movement yesterday. Nutrition: Tolerating oral diet Activity: Ambulating in the room Last BM: 05/25/2015  Assessment and Plan: 1. Ascites Patient presents with complains of abdominal pain and also has abdominal distention. Unsure whether the patient has been compliant with all of her medications at home. Appreciate input from GI. No significant ascites to be drained as per radiology will discontinue antibiotic since that is no fever or chills no evidence of infection no leukocytosis.  2. Cocaine abuse, alcohol dependence. Alcohol level significantly elevated on admission. Patient was counseled against use for further alcohol. Urine drug screen is also positive for cocaine. Avoiding beta blocker. Continue CIWA protocol. Holding IV narcotics and putting her on oral narcotics.  3. Protein calorie malnutrition. Currently remain nothing by mouth until the procedure. Advance diet as tolerated.  4. Type 2 diabetes mellitus. Unclear whether the patient is compliant with her insulin regimen at home as well. We will titrate insulin depending on her blood sugar with sliding scale.  5. Mood disorder. Continuing home medication.  6. History of cirrhosis. Resuming lactulose, continuing on Aldactone and Lasix. CA-19-9 elevated. MRI pending. Further workup and discharge planning depending on the MRI findings  DVT Prophylaxis: subcutaneous Heparin Nutrition: Nothing by mouth pending paracentesis Advance goals of care discussion: Full code  Brief Summary of  Hospitalization:  HPI: As per the H and P dictated on admission, "[name] is a 50 y.o. female with a past medical history significant for hep C and alcoholic cirrhosis, ongoing EtOH, and IDDM who presents with abdominal pain and ascites.  The patient was last admitted in early December, at which time she presented similarly with slowly progressive abdominal swelling, worsening abdominal pain, and tachycardia. She was evaluated by GI, had paracentesis of 7 L total (on 2 separate occasions) (negative for SBP), had a negative d-dimer because of the tachycardia which persisted throughout the hospitalization, and had a normal HIDA scan.  Since that hospitalization she has been back home, "mostly laying in bed". She reports adherence to both furosemide and spironolactone, but that the fluid in her belly has been slowly accumulating since discharge, and now the last 5 days she has developed worsening abdominal pain. This pain is severe, constant, and diffuse. It is associated with feeling short of breath, nausea, and feeling like she can't remember things. She denies vomiting, hematemesis, fever, chills.  In the ED, she was tachycardic but afebrile. There was hyperglycemia, she was intoxicated, and complained of a wound to her left foot for which she got a Td shot. TRH were asked to evaluate for admission and paracentesis" Daily update, Procedures: 05/24/2015 ultrasound-guided paracentesis could not be completed. reordered on 05/26/2015 Consultants: Gastroenterology Antibiotics: Anti-infectives    Start     Dose/Rate Route Frequency Ordered Stop   05/25/15 1430  rifaximin (XIFAXAN) tablet 550 mg     550 mg Oral 2 times daily 05/25/15 1336     05/24/15 1100  amoxicillin-clavulanate (AUGMENTIN) 875-125 MG per tablet 1 tablet     1 tablet Oral Every 12 hours 05/24/15 1000 06/03/15 0959  Family Communication: no  family was present at bedside, at the time of interview.   Disposition:  Expected  discharge date: 05/27/2015 Barriers to safe discharge: MRI abdomen   Intake/Output Summary (Last 24 hours) at 05/26/15 1706 Last data filed at 05/26/15 1300  Gross per 24 hour  Intake    360 ml  Output      0 ml  Net    360 ml   [facility] Weights   05/23/15 2053 05/24/15 0057  Weight: 49.584 kg (109 lb 5 oz) 49.442 kg (109 lb)    Objective: Physical Exam: Filed Vitals:   05/25/15 0633 05/25/15 1438 05/25/15 2036 05/26/15 1359  BP: 119/75 124/82 122/84 103/74  Pulse: 99 102 102 92  Temp: 98.4 F (36.9 C) 98.9 F (37.2 C) 98.5 F (36.9 C) 98 F (36.7 C)  TempSrc: Oral Oral Oral Oral  Resp: 20 18 18 18   Height:      Weight:      SpO2: 95% 97% 99% 96%    General: Appear in mild distress, no Rash; Oral Mucosa moist. Cardiovascular: S1 and S2 Present, no Murmur, no JVD Respiratory: Bilateral Air entry present and Clear to Auscultation, no Crackles, no wheezes Abdomen: Bowel Sound present, Soft and no tenderness on pressing deeper with stethoscope on auscultation, but significant tenderness even with superficial palpation Extremities: no Pedal edema, no calf tenderness Neurology: No asterixis   Data Reviewed: CBC:  Recent Labs Lab 05/23/15 2139 05/24/15 0231 05/25/15 0444  WBC 2.9* 2.2* 2.2*  NEUTROABS 1.6*  --  1.4*  HGB 13.2 12.0 12.8  HCT 39.6 36.0 37.1  MCV 105.9* 105.9* 102.8*  PLT 71* 61* 59*   Basic Metabolic Panel:  Recent Labs Lab 05/23/15 2139 05/24/15 0231 05/25/15 0444 05/26/15 0518  NA 135 139 131* 132*  K 3.8 3.2* 3.2* 3.7  CL 99* 100* 91* 92*  CO2 22 28 32 32  GLUCOSE 511* 289* 371* 320*  BUN 8 6 10 11   CREATININE 0.52 0.48 0.44 0.51  CALCIUM 8.8* 8.7* 8.3* 9.1   Liver Function Tests:  Recent Labs Lab 05/23/15 2139 05/24/15 0231 05/25/15 0444  AST 146* 130* 115*  ALT 90* 79* 73*  ALKPHOS 218* 181* 165*  BILITOT 1.7* 1.5* 2.4*  PROT 8.3* 7.8 7.4  ALBUMIN 3.3* 3.0* 2.8*    Recent Labs Lab 05/23/15 2139 05/26/15 0518  LIPASE  94* 55*    Recent Labs Lab 05/24/15 0231  AMMONIA 54*    Cardiac Enzymes:  Recent Labs Lab 05/23/15 2139  TROPONINI <0.03    BNP (last 3 results)  Recent Labs  05/23/15 2139  BNP 21.8    CBG:  Recent Labs Lab 05/25/15 1701 05/25/15 2137 05/26/15 0735 05/26/15 1209 05/26/15 1605  GLUCAP 318* 375* 275* 115* 123*    No results found for this or any previous visit (from the past 240 hour(s)).   Studies: US Abdomen Limited  05/26/2015  CLINICAL DATA:  Ascites. EXAM: LIMITED ABDOMEN ULTRASOUND FOR ASCITES TECHNIQUE: Limited ultrasound survey for ascites was performed in all four abdominal quadrants. COMPARISON:  Ultrasound 03/31/2015 . FINDINGS: Mild ascites. Ascites is too mild to safely perform paracentesis. Paracentesis not performed. IMPRESSION: Mild ascites. A large enough fluid collection is not identified to safely perform paracentesis. Electronically Signed   By: Marcello Moores  Register   On: 05/26/2015 11:52     Scheduled Meds: . amoxicillin-clavulanate  1 tablet Oral Q12H  . folic acid  1 mg Oral Daily  . furosemide  40 mg Oral BID  . insulin aspart  0-15 Units Subcutaneous TID WC  . insulin aspart  0-5 Units Subcutaneous QHS  . insulin aspart  3 Units Subcutaneous TID WC  . insulin glargine  20 Units Subcutaneous Q2200  . lactulose  30 g Oral Daily  . rifaximin  550 mg Oral BID  . sertraline  150 mg Oral Daily  . spironolactone  100 mg Oral BID  . thiamine  100 mg Oral Daily   Or  . thiamine  100 mg Intravenous Daily   Continuous Infusions:  PRN Meds: diphenhydrAMINE, LORazepam **OR** LORazepam, oxyCODONE  Time spent: 30 minutes  Author: Berle Mull, MD [facility] Pager: [phone] 05/26/2015 5:06 PM  If 7PM-7AM, please contact night-coverage at [email or web address], password [facility]

## 2015-05-26 NOTE — Progress Notes (Signed)
     [facility] Progress Note  Subjective:  Still with abdominal pain but overall feels ok.  Objective:  Vital signs in last 24 hours: Temp:  [98.5 F (36.9 C)-98.9 F (37.2 C)] 98.5 F (36.9 C) (02/02 2036) Pulse Rate:  [102] 102 (02/02 2036) Resp:  [18] 18 (02/02 2036) BP: (122-124)/(82-84) 122/84 mmHg (02/02 2036) SpO2:  [97 %-99 %] 99 % (02/02 2036) Last BM Date: 05/25/15 General:  Alert, Well-developed, in NAD Heart:  Slightly tachy; no murmurs Pulm:  CTAB.  No W/R/R. Abdomen:  Soft, distended with ascites fluid.  BS present.  Moderately tender diffusely. Extremities:  Without edema. Neurologic:  Alert and oriented x 4;  grossly normal neurologically.  No sign of DT's, confusion, or asterixis. Psych:  Alert and cooperative. Normal mood and affect.  Intake/Output from previous day: 02/02 0701 - 02/03 0700 In: 1080 [P.O.:1080] Out: -   Lab Results:  Recent Labs  05/23/15 2139 05/24/15 0231 05/25/15 0444  WBC 2.9* 2.2* 2.2*  HGB 13.2 12.0 12.8  HCT 39.6 36.0 37.1  PLT 71* 61* 59*   BMET  Recent Labs  05/24/15 0231 05/25/15 0444 05/26/15 0518  NA 139 131* 132*  K 3.2* 3.2* 3.7  CL 100* 91* 92*  CO2 28 32 32  GLUCOSE 289* 371* 320*  BUN 6 10 11   CREATININE 0.48 0.44 0.51  CALCIUM 8.7* 8.3* 9.1   LFT  Recent Labs  05/25/15 0444  PROT 7.4  ALBUMIN 2.8*  AST 115*  ALT 73*  ALKPHOS 165*  BILITOT 2.4*   PT/INR  Recent Labs  05/23/15 2139  LABPROT 15.8*  INR 1.24   US Abdomen Limited  05/24/2015  CLINICAL DATA:  50 year old female with a possible ascites EXAM: LIMITED ABDOMEN ULTRASOUND FOR ASCITES TECHNIQUE: Limited ultrasound survey for ascites was performed in all four abdominal quadrants. COMPARISON:  Prior paracentesis 03/31/2015 FINDINGS: Sonographic interrogation of the 4 quadrants of the abdomen demonstrates moderate sonographically simple ascites. IMPRESSION: Positive for moderate volume ascites. Electronically Signed    By: Jacqulynn Cadet M.D.   On: 05/24/2015 16:41   Assessment / Plan: *50 year old female with a history of decompensated cirrhosis, portal hypertension, hepatitis C, readmitted with increasing ascites and abdominal pain.  She has been noted in July to have a questionable lesion in the right lobe of the liver which may be a cirrhotic nodule not appreciated on CT at the time, or possibly developing [place] or cholangiocarcinoma.  AFP in December was 4.8, CA-19-9 currently 84. CT in December had patent portal, splenic, hepatic and renal veins with stable small periumbilical varices. Abdominal pain likely related to her ascites.   -Await results of MRI abdomen, which should be done today. -For paracentesis with fluid studies today as well so will follow-up on those. -Continue spironolactone 200 mg daily and furosemide 80 mg daily (these were increased 2/2).  -Monitor labs:  Renal function, electrolytes, etc. -CIWA protocol. -ETOH and drug abstinence.    LOS: 3 days   ZEHR, JESSICA D.  05/26/2015, 8:53 AM  Pager number [phone]   ________________________________________________________________________  Velora Heckler GI MD note:  I personally examined the patient, reviewed the data and agree with the assessment and plan described above.   Owens Loffler, MD [facility] Pager [phone]

## 2015-05-27 DIAGNOSIS — F1029 Alcohol dependence with unspecified alcohol-induced disorder: Secondary | ICD-10-CM | POA: Diagnosis not present

## 2015-05-27 DIAGNOSIS — R188 Other ascites: Secondary | ICD-10-CM | POA: Diagnosis not present

## 2015-05-27 DIAGNOSIS — D72819 Decreased white blood cell count, unspecified: Secondary | ICD-10-CM

## 2015-05-27 DIAGNOSIS — K7031 Alcoholic cirrhosis of liver with ascites: Secondary | ICD-10-CM | POA: Diagnosis not present

## 2015-05-27 DIAGNOSIS — F10120 Alcohol abuse with intoxication, uncomplicated: Secondary | ICD-10-CM | POA: Diagnosis not present

## 2015-05-27 LAB — GLUCOSE, CAPILLARY
GLUCOSE-CAPILLARY: 405 mg/dL — AB (ref 65–99)
GLUCOSE-CAPILLARY: 117 mg/dL — AB (ref 65–99)

## 2015-05-27 MED ORDER — SIMETHICONE 40 MG/0.6ML PO SUSP
40.0000 mg | Freq: Four times a day (QID) | ORAL | Status: DC | PRN
  Filled 2015-05-27: qty 0.6

## 2015-05-27 MED ORDER — FUROSEMIDE 40 MG PO TABS: 40.0000 mg | ORAL_TABLET | Freq: Two times a day (BID) | ORAL | Status: DC

## 2015-05-27 MED ORDER — RIFAXIMIN 550 MG PO TABS: 550.0000 mg | ORAL_TABLET | Freq: Two times a day (BID) | ORAL | Status: DC

## 2015-05-27 MED ORDER — LACTULOSE 10 GM/15ML PO SOLN
20.0000 g | Freq: Three times a day (TID) | ORAL | Status: DC
  Administered 2015-05-27: 20 g via ORAL
  Filled 2015-05-27: qty 30

## 2015-05-27 MED ORDER — INSULIN ASPART 100 UNIT/ML ~~LOC~~ SOLN
3.0000 [IU] | Freq: Once | SUBCUTANEOUS | Status: AC
  Administered 2015-05-27: 3 [IU] via SUBCUTANEOUS

## 2015-05-27 MED ORDER — OXYCODONE HCL 5 MG PO TABS: 5.0000 mg | ORAL_TABLET | Freq: Four times a day (QID) | ORAL | Status: DC | PRN

## 2015-05-27 MED ORDER — FOLIC ACID 1 MG PO TABS: 1.0000 mg | ORAL_TABLET | Freq: Every day | ORAL | Status: DC

## 2015-05-27 MED ORDER — SPIRONOLACTONE 100 MG PO TABS: 100.0000 mg | ORAL_TABLET | Freq: Two times a day (BID) | ORAL | Status: DC

## 2015-05-27 MED ORDER — LACTULOSE 10 GM/15ML PO SOLN: 20.0000 g | Freq: Three times a day (TID) | ORAL | Status: DC

## 2015-05-27 MED ORDER — THIAMINE HCL 100 MG PO TABS: 100.0000 mg | ORAL_TABLET | Freq: Every day | ORAL | Status: DC

## 2015-05-27 NOTE — Progress Notes (Signed)
UNASSIGNED PATIENT CROSS COVER [facility] Subjective:  Patient d enies any GI problems except for diffuse abdominal pain that she's had for several days now. She has a small volume BM this morning. Chart reviewed eccentric workup for the abdominal of normally elevated CA-19-9 has been unrevealing no evidence of cholangiocarcinoma. She denies having any nausea vomiting reflux dysphagia or odynophagia.  Objective: Vital signs in last 24 hours: Temp:  [98 F (36.7 C)-98.6 F (37 C)] 98.2 F (36.8 C) (02/04 0505) Pulse Rate:  [92-110] 92 (02/04 0505) Resp:  [18] 18 (02/04 0505) BP: (103-125)/(73-85) 105/73 mmHg (02/04 0505) SpO2:  [96 %-100 %] 96 % (02/04 0505) Weight:  [49.442 kg (109 lb)] 49.442 kg (109 lb) (02/03 1937) Last BM Date: 05/26/15  Intake/Output from previous day: 02/03 0701 - 02/04 0700 In: 798 [P.O.:798] Out: -  Intake/Output this shift: Total I/O In: 480 [P.O.:480] Out: -   General appearance: alert, cooperative, appears stated age, no distress and pale Resp: clear to auscultation bilaterally Cardio: regular rate and rhythm, S1, S2 normal, no murmur, click, rub or gallop GI: soft, distended with ascites but non-tender; bowel sounds normal; no masses,  no organomegaly  Lab Results:  Recent Labs  05/25/15 0444  WBC 2.2*  HGB 12.8  HCT 37.1  PLT 59*   BMET  Recent Labs  05/25/15 0444 05/26/15 0518  NA 131* 132*  K 3.2* 3.7  CL 91* 92*  CO2 32 32  GLUCOSE 371* 320*  BUN 10 11  CREATININE 0.44 0.51  CALCIUM 8.3* 9.1   LFT  Recent Labs  05/25/15 0444  PROT 7.4  ALBUMIN 2.8*  AST 115*  ALT 73*  ALKPHOS 165*  BILITOT 2.4*   Studies/Results: Mr 3d Recon At Scanner  05/26/2015  CLINICAL DATA:  Hepatitis-C and cirrhosis. Questionable enhancement on prior MRI in the RIGHT hepatic lobe. Elevated CA 19 9. EXAM: MRI ABDOMEN WITHOUT AND WITH CONTRAST (INCLUDING MRCP) TECHNIQUE: Multiplanar multisequence MR imaging of the abdomen was performed both before  and after the administration of intravenous contrast. Heavily T2-weighted images of the biliary and pancreatic ducts were obtained, and three-dimensional MRCP images were rendered by post processing. CONTRAST:  5 mL Eovist COMPARISON:  CT 03/30/2015, MRI 11/12/2014 FINDINGS: Lower chest: Lung bases are clear. No pleural fluid or pericardial fluid Hepatobiliary: Moderate volume ascites surrounds the RIGHT hepatic lobe. The liver as a nodular contour with some retraction in the RIGHT hepatic lobe. Mild hepatic steatosis identified on the opposed phase imaging. No discrete lesion on the noncontrast pulse sequences. The contrast enhanced imaging demonstrates extreme nodularity of the liver with enlargement of the caudate lobe. No focal enhancing lesion identified. Particular attention directed to the RIGHT hepatic lobe. Delayed imaging with the past site specific imaging agent (Eovist) demonstrates no focal lesion. The common bile duct is mildly dilated similar to comparison exams. The duct measures 6 mm in the pancreatic head which is upper limits of normal. No intrahepatic duct dilatation. No gallstones are evident. Pancreas: The pancreatic parenchyma has normal signal intensity on T1 weighted imaging. There is no duct dilatation. No variant pancreatic ductal anatomy. No enhancing lesion. Spleen: Spleen is mildly enlarged. Adrenals/urinary tract: Adrenal glands and kidneys are normal. Stomach/Bowel: Stomach and limited of the small bowel is unremarkable Vascular/Lymphatic: Abdominal aortic normal caliber. No retroperitoneal periportal lymphadenopathy. Portal veins are patent. Splenic vein pain Musculoskeletal: No aggressive osseous lesion IMPRESSION: 1. Morphologic changes in liver consistent cirrhosis. No enhancing lesion to suggest hepatocellular carcinoma. 2. Mild hepatic  steatosis. 3. Mild dilatation of the common bile duct similar to comparison exams. No obstructing lesion identified. 4. Normal pancreas. 5. Mild  splenomegaly.  Mild ascites. Electronically Signed   By: Suzy Bouchard M.D.   On: 05/26/2015 21:38   US Abdomen Limited  05/26/2015  CLINICAL DATA:  Ascites. EXAM: LIMITED ABDOMEN ULTRASOUND FOR ASCITES TECHNIQUE: Limited ultrasound survey for ascites was performed in all four abdominal quadrants. COMPARISON:  Ultrasound 03/31/2015 . FINDINGS: Mild ascites. Ascites is too mild to safely perform paracentesis. Paracentesis not performed. IMPRESSION: Mild ascites. A large enough fluid collection is not identified to safely perform paracentesis. Electronically Signed   By: Marcello Moores  Register   On: 05/26/2015 11:52   Mr Abd W/wo Cm/mrcp  05/26/2015  CLINICAL DATA:  Hepatitis-C and cirrhosis. Questionable enhancement on prior MRI in the RIGHT hepatic lobe. Elevated CA 19 9. EXAM: MRI ABDOMEN WITHOUT AND WITH CONTRAST (INCLUDING MRCP) TECHNIQUE: Multiplanar multisequence MR imaging of the abdomen was performed both before and after the administration of intravenous contrast. Heavily T2-weighted images of the biliary and pancreatic ducts were obtained, and three-dimensional MRCP images were rendered by post processing. CONTRAST:  5 mL Eovist COMPARISON:  CT 03/30/2015, MRI 11/12/2014 FINDINGS: Lower chest: Lung bases are clear. No pleural fluid or pericardial fluid Hepatobiliary: Moderate volume ascites surrounds the RIGHT hepatic lobe. The liver as a nodular contour with some retraction in the RIGHT hepatic lobe. Mild hepatic steatosis identified on the opposed phase imaging. No discrete lesion on the noncontrast pulse sequences. The contrast enhanced imaging demonstrates extreme nodularity of the liver with enlargement of the caudate lobe. No focal enhancing lesion identified. Particular attention directed to the RIGHT hepatic lobe. Delayed imaging with the past site specific imaging agent (Eovist) demonstrates no focal lesion. The common bile duct is mildly dilated similar to comparison exams. The duct measures 6 mm  in the pancreatic head which is upper limits of normal. No intrahepatic duct dilatation. No gallstones are evident. Pancreas: The pancreatic parenchyma has normal signal intensity on T1 weighted imaging. There is no duct dilatation. No variant pancreatic ductal anatomy. No enhancing lesion. Spleen: Spleen is mildly enlarged. Adrenals/urinary tract: Adrenal glands and kidneys are normal. Stomach/Bowel: Stomach and limited of the small bowel is unremarkable Vascular/Lymphatic: Abdominal aortic normal caliber. No retroperitoneal periportal lymphadenopathy. Portal veins are patent. Splenic vein pain Musculoskeletal: No aggressive osseous lesion IMPRESSION: 1. Morphologic changes in liver consistent cirrhosis. No enhancing lesion to suggest hepatocellular carcinoma. 2. Mild hepatic steatosis. 3. Mild dilatation of the common bile duct similar to comparison exams. No obstructing lesion identified. 4. Normal pancreas. 5. Mild splenomegaly.  Mild ascites. Electronically Signed   By: Suzy Bouchard M.D.   On: 05/26/2015 21:38   Medications: I have reviewed the patient's current medications.  Assessment/Plan: 1) Alcoholic cirrhosis with ascites. Patient's ongoing complaints of abdominal pain seems somewhat baffling no definite cause of abdominal skin pain can be identified she has strongly been urged to consider stopping the use of alcohol. On Rifaximin and Lactulose. Agree with discharge as discussed with Dr. Posey Pronto.  2) Hepatitis C/Hepatic steatosis .  3) Alcohol dependence.  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    LOS: 4 days   Alzada Brazee 05/27/2015, 10:51 AM

## 2015-05-27 NOTE — Discharge Instructions (Signed)
Community Resource Guide Inpatient Behavioral Health/Residential  °Substance Abuse Treatment °Adults °The [employer]’s “211” is a great source of information about community services available.  Access by dialing 2-1-1 from anywhere in [place], or by website -  www.nc211.org.  ° °(Updated 04/2015) ° °Crisis Assistance °24 hours a day °  °Services Offered ° °  °Area Served  °[facility] • 24-hour crisis assistance: 800-939-5911  [place], [place]  ° [facility] • 24-hour crisis assistance:336-342-8316 [place], [place]  °[facility] ° • 24-hour crisis assistance: 336-676-6840 [place], [place] °  °[facility] Access to Care Line • 24-hour crisis assistance; 800-256-2452 All °  °[facility] • 24-hour crisis response line: 877-626-1772 All  ° °Other Local Resources (Updated 04/2015) ° °Inpatient Behavioral Health/Residential Substance Abuse Treatment Programs °  °Services  ° ° °  °Address and Phone Number  °[facility] ° • 14-day residential rehabilitation  919-575-7928 °[address] °[place], [place]  °[facility])  ° • Detox - private pay only °• 14-day residential rehabilitation -  Medicaid, insurance, private pay only 336-784-9470, or °877-615-2722 °[address], [place], [place] [address]   °[facility] • Private Insurance only °• Multiple facilities [phone] admissions °  °[facility]) ° • 90-day program °• Must be homeless to participate ° 336-725-8389, or °800-758-6077 °[place], [place]  °[facility] ° ° ° • Private Insurance only 828-575-2701, or  °844-684-9200 °[address] °[place], [place] [address]  °[facility] ° ° ° • Must make an appointment °• Transportation is offered from [employer] on [address] °• Accepts private pay, Medicare, [place] Medicaid [phone]  °[address] [place], [place] [address]   °[facility] • Females only °• Associated with [facility] [place] [facility] 704-333-HOPE (4673) °[address] °[place], [place] [address]  °[facility] ° • Private insurance only [phone], or °800-659-3381 °[address] °[place], [place]  °[facility] ° ° • Detox °• Residential rehabilitation °• Private insurance only °• Multiple locations [phone] admissions  °[facility] [place] ° ° • Private pay °• Private insurance [phone] °[address] °[place] [address]  °[facility] ° ° • Males only °• Fee required at time of admission [phone] °[address] °[place], [place] [address]  °[facility] ° ° • Private pay only ° [phone] °[address] °[place], [place]  °[facility]  ° • Detox - private pay, Medicaid °• Residential rehabilitation for males  - Medicare, Medicaid, insurance, private pay 336-227-7417 °[address] °[place], [place]   °[facility]  ° • Walk-in interviews Monday - Saturday from 8 am - 4 pm °• Individuals with legal charges are not eligible [phone] °[address] °[place], [place] [address]  °[facility]  • Must be willing to work °• Must attend Alcoholics Anonymous meetings [phone] °[address] °[place], [place]   °[place] [facility]  ° • Faith-based program °• Private pay only [phone] °[address] °[place], [place]  ° °

## 2015-05-27 NOTE — Care Management Note (Signed)
Case Management Note  Patient Details  Name: [name] MRN: [id number] Date of Birth: 1965/06/13  Subjective/Objective:                  Abdominal pain  Action/Plan: CM spoke with patient at the bedside. Patient lives at home with her husband who is able to assist her. States he will return home tomorrow. No discharge needs. She will call someone to take her home today when she is discharged. Patient is aware of the option of SW for a voucher is she is unable to secure transportation home.    Expected Discharge Date:   05/27/15           Expected Discharge Plan:  Home/Self Care  In-House Referral:     Discharge planning Services  CM Consult  Post Acute Care Choice:    Choice offered to:  NA  DME Arranged:  N/A DME Agency:  NA  HH Arranged:  NA HH Agency:  NA  Status of Service:  Completed, signed off  Medicare Important Message Given:    Date Medicare IM Given:    Medicare IM give by:    Date Additional Medicare IM Given:    Additional Medicare Important Message give by:     If discussed at [place] of Stay Meetings, dates discussed:    Additional Comments:  Apolonio Schneiders, RN 05/27/2015, 2:53 PM

## 2015-05-27 NOTE — Clinical Social Work Note (Signed)
Clinical Social Work Assessment  Patient Details  Name: [name] MRN: [id number] Date of Birth: 22-Mar-1966  Date of referral:  05/27/15               Reason for consult:  Discharge Planning                Permission sought to share information with:  Family Supports Permission granted to share information::     Name::        Agency::     Relationship::     Contact Information:     Housing/Transportation Living arrangements for the past 2 months:  Single Family Home Source of Information:  Spouse Patient Interpreter Needed:  None Criminal Activity/Legal Involvement Pertinent to Current Situation/Hospitalization:  No - Comment as needed Significant Relationships:  Spouse Lives with:  Spouse Do you feel safe going back to the place where you live?  Yes Need for family participation in patient care:  No (Coment)  Care giving concerns:  Per MD, pt discharging today and wanted CSW to provide pt with substance abuse resources prior to pt discharge.   Social Worker assessment / plan:    CSW met with pt at bedside regarding substance abuse concerns. CSW acknowledges that she drinks alcohol, but states that she "doesn't drink very often". CSW discussed outpatient substance abuse resources and pt stated that she is aware of resources available, but agreeable to accept list of outpatient resources.  Pt discussed that she has tried to arrange a ride home today, but has been unsuccessful as pt mother unable to drive and pt spouse is out of town. Pt does not live near bus route. CSW to provide taxi voucher to RN  for transport home and RN will call taxi company when discharge instructions have been reviewed. CSW discussed with pt about Medicaid transportation and provided pt information for applying for Medicaid transportation to have a transportation resource to doctor's appointments.   No further social work needs identified at this time.  CSW signing off.  Employment status:    Insurance  information:    PT Recommendations:  Not assessed at this time Information / Referral to community resources:     Patient/Family's Response to care:  Pt alert and oriented x 4. Pt ambulatory in pt room. Pt was aware of substance abuse resources from previous resources that had been provided, however, pt accepting of resource list.   Patient/Family's Understanding of and Emotional Response to Diagnosis, Current Treatment, and Prognosis:  Pt aware of diagnosis and aware of MD recommendation for pt to seek outpatient resources for substance abuse. Pt reports that she has made a recent change in not drinking "as much" and CSW encouraged outpatient treatment for support.   Emotional Assessment Appearance:  Appears older than stated age Attitude/Demeanor/Rapport:  Other (pt appropriate) Affect (typically observed):  Appropriate Orientation:  Oriented to Self, Oriented to Place, Oriented to  Time, Oriented to Situation Alcohol / Substance use:  Not Applicable Psych involvement (Current and /or in the community):  No (Comment)  Discharge Needs  Concerns to be addressed:  Substance Abuse Concerns Readmission within the last 30 days:  No Current discharge risk:  None Barriers to Discharge:  No Barriers Identified   Pierz, [place], LCSW 05/27/2015, 4:06 PM  [phone]

## 2015-05-29 NOTE — Discharge Summary (Signed)
[facility] Discharge Summary   Patient: [name] [id number]   PCP: Cathlean Cower, MD DOB: 07/21/1965   Date of admission: 05/23/2015   Date of discharge: 05/29/2015    Discharge Diagnoses:  Principal Problem:   Ascites Active Problems:   Cirrhosis ([place])   Alcohol dependence ([place])   Cocaine abuse   MDD (major depressive disorder) ([facility])   Acute alcohol intoxication ([place])   Thrombocytopenia ([place])   Diabetes mellitus type 2, controlled ([place])   Protein-calorie malnutrition, severe ([place])   Leukopenia   Recommendations for Outpatient Follow-up:  1. Please follow-up with provided resources for alcohol rehabilitation  2. Please follow-up with PCP as well as gastroenterology as scheduled.  Follow-up Information    Follow up with [facility] On 05/31/2015.   Why:  Hospital follow-up appointment on 05/31/15 at 11:00 am with Dr. Jarold Song.   Contact information:   [address] [place] [phone]      Follow up with [facility]. Schedule an appointment as soon as possible for a visit in 1 week.   Specialty:  Gastroenterology   Contact information:   [place] [phone]      Diet recommendation: Low-salt diet  Activity: The patient is advised to gradually reintroduce usual activities.  Discharge Condition: fair  History of present illness: As per the H and P dictated on admission, "[name] is a 50 y.o. female with a past medical history significant for hep C and alcoholic cirrhosis, ongoing EtOH, and IDDM who presents with abdominal pain and ascites.  The patient was last admitted in early December, at which time she presented similarly with slowly progressive abdominal swelling, worsening abdominal pain, and tachycardia. She was evaluated by GI, had paracentesis of 7 L total (on 2 separate occasions) (negative for SBP), had a negative d-dimer because  of the tachycardia which persisted throughout the hospitalization, and had a normal HIDA scan.  Since that hospitalization she has been back home, "mostly laying in bed". She reports adherence to both furosemide and spironolactone, but that the fluid in her belly has been slowly accumulating since discharge, and now the last 5 days she has developed worsening abdominal pain. This pain is severe, constant, and diffuse. It is associated with feeling short of breath, nausea, and feeling like she can't remember things. She denies vomiting, hematemesis, fever, chills.  In the ED, she was tachycardic but afebrile. There was hyperglycemia, she was intoxicated, and complained of a wound to her left foot for which she got a Td shot. TRH were asked to evaluate for admission and paracentesis."  Hospital Course:  Summary of her active problems in the hospital is as following. 1. 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 This admission patient was sent for ultrasound-guided paracentesis twice but they could not find any significant ascites to drain. Appreciate input from GI.  patient was started on antibiotic in the ER which were discontinued since the patient did not have any evidence of fever chills, leukocytosis and lactic acidosis. MRI of the abdomen was performed which did show cirrhosis but did not show any significant ascites and also there was no evidence of mass or lesion suggesting HCC or cholangiocarcinoma. CA-19-9 was elevated. Therefore she will require continue follow-up with gastroenterology as an outpatient.   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  2.medical noncompliance. Patient presents with worsening ascites and mentions she has not been taking Lasix and Aldactone at home. Patient has alcohol liver cirrhosis and continues to drink despite multiple inputs and recommendation for abstinence. Patient mentions initially that she was requested by the GI Department to find another gastroenterology due to her insurance issues but patient has been no-show for last 3 appointments.  she also has not been taking lactulose at home and presented with mild encephalopathy as well as constipation.  patient was provided phone number to contact GI for outpatient follow-up, as well as [facility] for follow-up. Patient was also provided resources for alcohol rehabilitation. And requested to remain compliant with alcohol abstinence as well as current medical regimen  3. Cocaine abuse, alcohol dependence. Alcohol level significantly elevated on admission. Patient was counseled against use for further alcohol. Urine drug screen is also positive for cocaine. Avoiding beta blocker. Patient was on CIWA protocol and did not have any evidence of withdrawal here in the hospital.   3. Protein calorie malnutrition. Advance diet as tolerated. more likely alcohol abstinence will improve her nutrition status  4. Type 2 diabetes mellitus. Unclear whether the patient is compliant with her insulin regimen at home as well. Patient was given new prescription for insulin since she mentions that she ran out of her insulin at home as well.   5. Mood disorder. Continuing home medication.  6. Chronic thrombocytopenia. Chronic leukopenia. Most like is secondary to  cirrhosis. No active bleeding reported here. Continue to monitor.  All other chronic medical condition were stable during the hospitalization.  Patient was ambulatory without any assistance. On the day of the discharge the patient's vitals remained stable, and no other acute medical condition were reported by patient. the patient was felt safe to be discharge at home with family support.  Procedures and Results:  None   Consultations:  Gastroenterology  DISCHARGE MEDICATION: Discharge Medication List as of 05/27/2015  5:02 PM    START taking these medications   Details  folic acid (FOLVITE) 1 MG tablet Take 1 tablet (1 mg total) by mouth daily., Starting 05/27/2015, Until Discontinued, Normal    oxyCODONE (OXY IR/ROXICODONE) 5 MG immediate release tablet Take 1 tablet (5 mg total) by mouth every 6 (six) hours as needed for severe pain., Starting 05/27/2015, Until Discontinued, Print    rifaximin (XIFAXAN) 550 MG TABS tablet Take 1 tablet (550 mg total) by mouth 2 (two) times daily., Starting 05/27/2015, Until Discontinued, Normal    thiamine 100 MG tablet Take 1 tablet (100 mg total) by mouth daily., Starting 05/27/2015, Until Discontinued, Normal      CONTINUE these medications which have CHANGED   Details  furosemide (LASIX) 40 MG tablet Take 1 tablet (40 mg total) by mouth 2 (two) times daily., Starting 05/27/2015, Until Discontinued, Normal    lactulose (CHRONULAC) 10 GM/15ML solution Take 30 mLs (20 g total) by mouth 3 (three) times daily. Hold for the day after you have 3 soft stool/day., Starting 05/27/2015, Until Discontinued,  Normal    spironolactone (ALDACTONE) 100 MG tablet Take 1 tablet (100 mg total) by mouth 2 (two) times daily., Starting 05/27/2015, Until Discontinued, Normal      CONTINUE these medications which have NOT CHANGED   Details  calcium carbonate (TUMS EX) 750 MG chewable tablet Chew 3 tablets by mouth 4 (four) times daily as needed for heartburn., Until  Discontinued, Historical Med    diphenhydrAMINE (BENADRYL) 25 MG tablet Take 100 mg by mouth at bedtime. , Until Discontinued, Historical Med    ibuprofen (ADVIL,MOTRIN) 200 MG tablet Take 200 mg by mouth 4 (four) times daily., Until Discontinued, Historical Med    KLOR-CON M10 10 MEQ tablet Take 10 mEq by mouth daily. , Starting 11/15/2014, Until Discontinued, Historical Med    metFORMIN (GLUCOPHAGE) 500 MG tablet TAKE 1 TABLET (500 MG TOTAL) BY MOUTH 2 (TWO) TIMES DAILY WITH A MEAL., Normal    Multiple Vitamin (MULTIVITAMIN WITH MINERALS) TABS tablet Take 1 tablet by mouth daily. , Until Discontinued, Historical Med    NOVOLOG FLEXPEN 100 UNIT/ML FlexPen INJECT 2 UNITS INTO SKIN 3 TIMES DAILY BEFORE MEALS, Normal    sertraline (ZOLOFT) 100 MG tablet Take 150 mg by mouth daily., Until Discontinued, Historical Med    LANTUS 100 UNIT/ML injection INJECT 0.2 MLS (20 UNITS TOTAL) INTO THE SKIN AT BEDTIME., Normal      STOP taking these medications     Insulin Glargine (LANTUS SOLOSTAR) 100 UNIT/ML Solostar Pen        Allergies  Allergen Reactions  . Betadine [Povidone Iodine] Hives   Discharge Instructions    Diet - low sodium heart healthy    Complete by:  As directed      Discharge instructions    Complete by:  As directed   It is important that you read following instructions as well as go over your medication list with RN to help you understand your care after this hospitalization.  Discharge Instructions: Please follow up with GI in 1-2 week.  Please follow-up with PCP in one week.  Please request your primary care physician to go over all Hospital Tests and Procedure/Radiological results at the follow up,  Please get all Hospital records sent to your PCP by signing hospital release before you go home.   Do not drive, operating heavy machinery, perform activities at heights, swimming or participation in water activities or provide baby sitting services while your are on  Pain, Sleep and Anxiety Medications; until you have been seen by Primary Care Physician or a Neurologist and advised to do so again. Do not take more than prescribed Pain, Sleep and Anxiety Medications. You were cared for by a hospitalist during your hospital stay. If you have any questions about your discharge medications or the care you received while you were in the hospital after you are discharged, you can call the unit and ask to speak with the hospitalist on call if the hospitalist that took care of you is not available.  Once you are discharged, your primary care physician will handle any further medical issues. Please note that NO REFILLS for any discharge medications will be authorized once you are discharged, as it is imperative that you return to your primary care physician (or establish a relationship with a primary care physician if you do not have one) for your aftercare needs so that they can reassess your need for medications and monitor your lab values. You Must read complete instructions/literature along with all the possible adverse  reactions/side effects for all the Medicines you take and that have been prescribed to you. Take any new Medicines after you have completely understood and accept all the possible adverse reactions/side effects. Wear Seat belts while driving. Stop drinking alcohol. If you have smoked or chewed Tobacco in the last 2 yrs please stop smoking and/or stop any Recreational drug use.     Increase activity slowly    Complete by:  As directed           Discharge Exam: Filed Weights   05/23/15 2053 05/24/15 0057  Weight: 49.584 kg (109 lb 5 oz) 49.442 kg (109 lb)   Filed Vitals:   05/27/15 0505 05/27/15 1400  BP: 105/73 114/74  Pulse: 92 105  Temp: 98.2 F (36.8 C) 97.7 F (36.5 C)  Resp: 18 20   General: Appear in mild distress, no Rash; Oral Mucosa moist. Cardiovascular: S1 and S2 Present, no Murmur, no JVD Respiratory: Bilateral Air entry  present and Clear to Auscultation, no Crackles, no wheezes Abdomen: Bowel Sound present, Soft and diffuse tenderness Extremities: n Pedal edema, no calf tenderness Neurology: Grossly no focal neuro deficit.  The results of significant diagnostics from this hospitalization (including imaging, microbiology, ancillary and laboratory) are listed below for reference.    Significant Diagnostic Studies: Dg Chest 2 View  05/23/2015  CLINICAL DATA:  Shortness of breath EXAM: CHEST  2 VIEW COMPARISON:  03/29/2015 FINDINGS: Heart size is normal. There is no pleural effusion or edema identified. No airspace consolidation noted. Mild spondylosis is present within the thoracic spine. IMPRESSION: 1. No acute cardiopulmonary abnormalities. Electronically Signed   By: Kerby Moors M.D.   On: 05/23/2015 23:16   Mr 3d Recon At Scanner  05/26/2015  CLINICAL DATA:  Hepatitis-C and cirrhosis. Questionable enhancement on prior MRI in the RIGHT hepatic lobe. Elevated CA 19 9. EXAM: MRI ABDOMEN WITHOUT AND WITH CONTRAST (INCLUDING MRCP) TECHNIQUE: Multiplanar multisequence MR imaging of the abdomen was performed both before and after the administration of intravenous contrast. Heavily T2-weighted images of the biliary and pancreatic ducts were obtained, and three-dimensional MRCP images were rendered by post processing. CONTRAST:  5 mL Eovist COMPARISON:  CT 03/30/2015, MRI 11/12/2014 FINDINGS: Lower chest: Lung bases are clear. No pleural fluid or pericardial fluid Hepatobiliary: Moderate volume ascites surrounds the RIGHT hepatic lobe. The liver as a nodular contour with some retraction in the RIGHT hepatic lobe. Mild hepatic steatosis identified on the opposed phase imaging. No discrete lesion on the noncontrast pulse sequences. The contrast enhanced imaging demonstrates extreme nodularity of the liver with enlargement of the caudate lobe. No focal enhancing lesion identified. Particular attention directed to the RIGHT  hepatic lobe. Delayed imaging with the past site specific imaging agent (Eovist) demonstrates no focal lesion. The common bile duct is mildly dilated similar to comparison exams. The duct measures 6 mm in the pancreatic head which is upper limits of normal. No intrahepatic duct dilatation. No gallstones are evident. Pancreas: The pancreatic parenchyma has normal signal intensity on T1 weighted imaging. There is no duct dilatation. No variant pancreatic ductal anatomy. No enhancing lesion. Spleen: Spleen is mildly enlarged. Adrenals/urinary tract: Adrenal glands and kidneys are normal. Stomach/Bowel: Stomach and limited of the small bowel is unremarkable Vascular/Lymphatic: Abdominal aortic normal caliber. No retroperitoneal periportal lymphadenopathy. Portal veins are patent. Splenic vein pain Musculoskeletal: No aggressive osseous lesion IMPRESSION: 1. Morphologic changes in liver consistent cirrhosis. No enhancing lesion to suggest hepatocellular carcinoma. 2. Mild hepatic steatosis. 3. Mild dilatation of  the common bile duct similar to comparison exams. No obstructing lesion identified. 4. Normal pancreas. 5. Mild splenomegaly.  Mild ascites. Electronically Signed   By: Suzy Bouchard M.D.   On: 05/26/2015 21:38   US Abdomen Limited  05/26/2015  CLINICAL DATA:  Ascites. EXAM: LIMITED ABDOMEN ULTRASOUND FOR ASCITES TECHNIQUE: Limited ultrasound survey for ascites was performed in all four abdominal quadrants. COMPARISON:  Ultrasound 03/31/2015 . FINDINGS: Mild ascites. Ascites is too mild to safely perform paracentesis. Paracentesis not performed. IMPRESSION: Mild ascites. A large enough fluid collection is not identified to safely perform paracentesis. Electronically Signed   By: Marcello Moores  Register   On: 05/26/2015 11:52   US Abdomen Limited  05/24/2015  CLINICAL DATA:  50 year old female with a possible ascites EXAM: LIMITED ABDOMEN ULTRASOUND FOR ASCITES TECHNIQUE: Limited ultrasound survey for ascites was  performed in all four abdominal quadrants. COMPARISON:  Prior paracentesis 03/31/2015 FINDINGS: Sonographic interrogation of the 4 quadrants of the abdomen demonstrates moderate sonographically simple ascites. IMPRESSION: Positive for moderate volume ascites. Electronically Signed   By: Jacqulynn Cadet M.D.   On: 05/24/2015 16:41   Mr Abd W/wo Cm/mrcp  05/26/2015  CLINICAL DATA:  Hepatitis-C and cirrhosis. Questionable enhancement on prior MRI in the RIGHT hepatic lobe. Elevated CA 19 9. EXAM: MRI ABDOMEN WITHOUT AND WITH CONTRAST (INCLUDING MRCP) TECHNIQUE: Multiplanar multisequence MR imaging of the abdomen was performed both before and after the administration of intravenous contrast. Heavily T2-weighted images of the biliary and pancreatic ducts were obtained, and three-dimensional MRCP images were rendered by post processing. CONTRAST:  5 mL Eovist COMPARISON:  CT 03/30/2015, MRI 11/12/2014 FINDINGS: Lower chest: Lung bases are clear. No pleural fluid or pericardial fluid Hepatobiliary: Moderate volume ascites surrounds the RIGHT hepatic lobe. The liver as a nodular contour with some retraction in the RIGHT hepatic lobe. Mild hepatic steatosis identified on the opposed phase imaging. No discrete lesion on the noncontrast pulse sequences. The contrast enhanced imaging demonstrates extreme nodularity of the liver with enlargement of the caudate lobe. No focal enhancing lesion identified. Particular attention directed to the RIGHT hepatic lobe. Delayed imaging with the past site specific imaging agent (Eovist) demonstrates no focal lesion. The common bile duct is mildly dilated similar to comparison exams. The duct measures 6 mm in the pancreatic head which is upper limits of normal. No intrahepatic duct dilatation. No gallstones are evident. Pancreas: The pancreatic parenchyma has normal signal intensity on T1 weighted imaging. There is no duct dilatation. No variant pancreatic ductal anatomy. No enhancing  lesion. Spleen: Spleen is mildly enlarged. Adrenals/urinary tract: Adrenal glands and kidneys are normal. Stomach/Bowel: Stomach and limited of the small bowel is unremarkable Vascular/Lymphatic: Abdominal aortic normal caliber. No retroperitoneal periportal lymphadenopathy. Portal veins are patent. Splenic vein pain Musculoskeletal: No aggressive osseous lesion IMPRESSION: 1. Morphologic changes in liver consistent cirrhosis. No enhancing lesion to suggest hepatocellular carcinoma. 2. Mild hepatic steatosis. 3. Mild dilatation of the common bile duct similar to comparison exams. No obstructing lesion identified. 4. Normal pancreas. 5. Mild splenomegaly.  Mild ascites. Electronically Signed   By: Suzy Bouchard M.D.   On: 05/26/2015 21:38    Microbiology: No results found for this or any previous visit (from the past 240 hour(s)).   Labs: CBC:  Recent Labs Lab 05/23/15 2139 05/24/15 0231 05/25/15 0444  WBC 2.9* 2.2* 2.2*  NEUTROABS 1.6*  --  1.4*  HGB 13.2 12.0 12.8  HCT 39.6 36.0 37.1  MCV 105.9* 105.9* 102.8*  PLT 71* 61* 59*  Basic Metabolic Panel:  Recent Labs Lab 05/23/15 2139 05/24/15 0231 05/25/15 0444 05/26/15 0518  NA 135 139 131* 132*  K 3.8 3.2* 3.2* 3.7  CL 99* 100* 91* 92*  CO2 22 28 32 32  GLUCOSE 511* 289* 371* 320*  BUN 8 6 10 11   CREATININE 0.52 0.48 0.44 0.51  CALCIUM 8.8* 8.7* 8.3* 9.1   Liver Function Tests:  Recent Labs Lab 05/23/15 2139 05/24/15 0231 05/25/15 0444  AST 146* 130* 115*  ALT 90* 79* 73*  ALKPHOS 218* 181* 165*  BILITOT 1.7* 1.5* 2.4*  PROT 8.3* 7.8 7.4  ALBUMIN 3.3* 3.0* 2.8*    Recent Labs Lab 05/23/15 2139 05/26/15 0518  LIPASE 94* 55*    Recent Labs Lab 05/24/15 0231  AMMONIA 54*   Cardiac Enzymes:  Recent Labs Lab 05/23/15 2139  TROPONINI <0.03   BNP (last 3 results)  Recent Labs  05/23/15 2139  BNP 21.8   CBG:  Recent Labs Lab 05/26/15 1209 05/26/15 1605 05/26/15 2140 05/27/15 0751  05/27/15 1153  GLUCAP 115* 123* 159* 405* 117*   Time spent: 30 minutes  Signed:  Branndon Tuite  [facility] 05/29/2015, 7:23 AM

## 2015-05-31 ENCOUNTER — Inpatient Hospital Stay: Payer: Self-pay | Admitting: Family Medicine

## 2015-06-10 ENCOUNTER — Encounter (HOSPITAL_COMMUNITY): Payer: Self-pay | Admitting: *Deleted

## 2015-06-10 ENCOUNTER — Emergency Department (HOSPITAL_COMMUNITY): Payer: Medicaid Other

## 2015-06-10 ENCOUNTER — Emergency Department (HOSPITAL_COMMUNITY)
Admission: EM | Admit: 2015-06-10 | Discharge: 2015-06-10 | Disposition: A | Discharge status: Home / Self Care | Payer: Medicaid Other | Attending: Emergency Medicine | Admitting: Emergency Medicine

## 2015-06-10 DIAGNOSIS — Z8619 Personal history of other infectious and parasitic diseases: Secondary | ICD-10-CM | POA: Insufficient documentation

## 2015-06-10 DIAGNOSIS — F329 Major depressive disorder, single episode, unspecified: Secondary | ICD-10-CM | POA: Insufficient documentation

## 2015-06-10 DIAGNOSIS — Y998 Other external cause status: Secondary | ICD-10-CM | POA: Diagnosis not present

## 2015-06-10 DIAGNOSIS — Z862 Personal history of diseases of the blood and blood-forming organs and certain disorders involving the immune mechanism: Secondary | ICD-10-CM | POA: Insufficient documentation

## 2015-06-10 DIAGNOSIS — Z794 Long term (current) use of insulin: Secondary | ICD-10-CM | POA: Diagnosis not present

## 2015-06-10 DIAGNOSIS — Z791 Long term (current) use of non-steroidal anti-inflammatories (NSAID): Secondary | ICD-10-CM | POA: Insufficient documentation

## 2015-06-10 DIAGNOSIS — F419 Anxiety disorder, unspecified: Secondary | ICD-10-CM | POA: Insufficient documentation

## 2015-06-10 DIAGNOSIS — Z792 Long term (current) use of antibiotics: Secondary | ICD-10-CM | POA: Insufficient documentation

## 2015-06-10 DIAGNOSIS — Z7984 Long term (current) use of oral hypoglycemic drugs: Secondary | ICD-10-CM | POA: Diagnosis not present

## 2015-06-10 DIAGNOSIS — Y9389 Activity, other specified: Secondary | ICD-10-CM | POA: Insufficient documentation

## 2015-06-10 DIAGNOSIS — F10120 Alcohol abuse with intoxication, uncomplicated: Secondary | ICD-10-CM | POA: Insufficient documentation

## 2015-06-10 DIAGNOSIS — F10929 Alcohol use, unspecified with intoxication, unspecified: Secondary | ICD-10-CM

## 2015-06-10 DIAGNOSIS — S0991XA Unspecified injury of ear, initial encounter: Secondary | ICD-10-CM | POA: Insufficient documentation

## 2015-06-10 DIAGNOSIS — G8929 Other chronic pain: Secondary | ICD-10-CM | POA: Insufficient documentation

## 2015-06-10 DIAGNOSIS — Y9289 Other specified places as the place of occurrence of the external cause: Secondary | ICD-10-CM | POA: Insufficient documentation

## 2015-06-10 DIAGNOSIS — Z8719 Personal history of other diseases of the digestive system: Secondary | ICD-10-CM | POA: Diagnosis not present

## 2015-06-10 DIAGNOSIS — Z79899 Other long term (current) drug therapy: Secondary | ICD-10-CM | POA: Diagnosis not present

## 2015-06-10 DIAGNOSIS — E119 Type 2 diabetes mellitus without complications: Secondary | ICD-10-CM | POA: Insufficient documentation

## 2015-06-10 DIAGNOSIS — S0990XA Unspecified injury of head, initial encounter: Secondary | ICD-10-CM | POA: Diagnosis present

## 2015-06-10 DIAGNOSIS — K5900 Constipation, unspecified: Secondary | ICD-10-CM | POA: Diagnosis not present

## 2015-06-10 LAB — ETHANOL: Alcohol, Ethyl (B): 239 mg/dL — ABNORMAL HIGH (ref <5)

## 2015-06-10 LAB — BASIC METABOLIC PANEL
Anion gap: 11 (ref 5–15)
BUN: <5 mg/dL — ABNORMAL LOW (ref 6–20)
CALCIUM: 8.8 mg/dL — AB (ref 8.9–10.3)
CHLORIDE: 100 mmol/L — AB (ref 101–111)
CO2: 28 mmol/L (ref 22–32)
CREATININE: 0.45 mg/dL (ref 0.44–1.00)
GFR calc non Af Amer: >60 mL/min (ref >60)
GLUCOSE: 361 mg/dL — AB (ref 65–99)
Potassium: 3.8 mmol/L (ref 3.5–5.1)
Sodium: 139 mmol/L (ref 135–145)

## 2015-06-10 LAB — CBC WITH DIFFERENTIAL/PLATELET
BASOS PCT: 0 %
Basophils Absolute: 0 10*3/uL (ref 0.0–0.1)
EOS ABS: 0.1 10*3/uL (ref 0.0–0.7)
Eosinophils Relative: 2 %
HEMATOCRIT: 41.5 % (ref 36.0–46.0)
HEMOGLOBIN: 13.9 g/dL (ref 12.0–15.0)
LYMPHS ABS: 0.7 10*3/uL (ref 0.7–4.0)
Lymphocytes Relative: 28 %
MCH: 35 pg — AB (ref 26.0–34.0)
MCHC: 33.5 g/dL (ref 30.0–36.0)
MCV: 104.5 fL — ABNORMAL HIGH (ref 78.0–100.0)
MONO ABS: 0.3 10*3/uL (ref 0.1–1.0)
MONOS PCT: 11 %
NEUTROS ABS: 1.4 10*3/uL — AB (ref 1.7–7.7)
NEUTROS PCT: 58 %
Platelets: 63 10*3/uL — ABNORMAL LOW (ref 150–400)
RBC: 3.97 MIL/uL (ref 3.87–5.11)
RDW: 13.8 % (ref 11.5–15.5)
WBC: 2.5 10*3/uL — ABNORMAL LOW (ref 4.0–10.5)

## 2015-06-10 MED ORDER — ACETAMINOPHEN 325 MG PO TABS
650.0000 mg | ORAL_TABLET | Freq: Once | ORAL | Status: AC
Start: 2015-06-10 — End: 2015-06-10
  Administered 2015-06-10: 650 mg via ORAL
  Filled 2015-06-10: qty 2
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
## 2015-06-10 NOTE — ED Notes (Signed)
Patient transported to CT 
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
## 2015-06-10 NOTE — ED Provider Notes (Signed)
4:13 PM Pt was able to ambulate in the hall without difficulty  Jola Schmidt, MD 06/10/15 [phone]

## 2015-07-13 ENCOUNTER — Other Ambulatory Visit: Payer: Self-pay | Admitting: Internal Medicine

## 2015-08-07 ENCOUNTER — Emergency Department (HOSPITAL_COMMUNITY): Payer: Medicaid Other

## 2015-08-07 ENCOUNTER — Inpatient Hospital Stay (HOSPITAL_COMMUNITY)
Admission: EM | Admit: 2015-08-07 | Discharge: 2015-08-10 | DRG: 432 | Disposition: A | Discharge status: Home / Self Care | Payer: Medicaid Other | Attending: Internal Medicine | Admitting: Internal Medicine

## 2015-08-07 ENCOUNTER — Encounter (HOSPITAL_COMMUNITY): Payer: Self-pay

## 2015-08-07 DIAGNOSIS — K920 Hematemesis: Secondary | ICD-10-CM

## 2015-08-07 DIAGNOSIS — R161 Splenomegaly, not elsewhere classified: Secondary | ICD-10-CM | POA: Diagnosis present

## 2015-08-07 DIAGNOSIS — K219 Gastro-esophageal reflux disease without esophagitis: Secondary | ICD-10-CM | POA: Diagnosis present

## 2015-08-07 DIAGNOSIS — I8510 Secondary esophageal varices without bleeding: Secondary | ICD-10-CM | POA: Insufficient documentation

## 2015-08-07 DIAGNOSIS — E11649 Type 2 diabetes mellitus with hypoglycemia without coma: Secondary | ICD-10-CM | POA: Diagnosis present

## 2015-08-07 DIAGNOSIS — K921 Melena: Secondary | ICD-10-CM | POA: Diagnosis present

## 2015-08-07 DIAGNOSIS — K7011 Alcoholic hepatitis with ascites: Secondary | ICD-10-CM

## 2015-08-07 DIAGNOSIS — K319 Disease of stomach and duodenum, unspecified: Secondary | ICD-10-CM | POA: Diagnosis present

## 2015-08-07 DIAGNOSIS — F1120 Opioid dependence, uncomplicated: Secondary | ICD-10-CM | POA: Diagnosis present

## 2015-08-07 DIAGNOSIS — E876 Hypokalemia: Secondary | ICD-10-CM | POA: Diagnosis present

## 2015-08-07 DIAGNOSIS — Z794 Long term (current) use of insulin: Secondary | ICD-10-CM

## 2015-08-07 DIAGNOSIS — F1029 Alcohol dependence with unspecified alcohol-induced disorder: Secondary | ICD-10-CM

## 2015-08-07 DIAGNOSIS — I8500 Esophageal varices without bleeding: Secondary | ICD-10-CM | POA: Diagnosis present

## 2015-08-07 DIAGNOSIS — R932 Abnormal findings on diagnostic imaging of liver and biliary tract: Secondary | ICD-10-CM

## 2015-08-07 DIAGNOSIS — T39315A Adverse effect of propionic acid derivatives, initial encounter: Secondary | ICD-10-CM | POA: Diagnosis present

## 2015-08-07 DIAGNOSIS — K5909 Other constipation: Secondary | ICD-10-CM | POA: Diagnosis present

## 2015-08-07 DIAGNOSIS — D649 Anemia, unspecified: Secondary | ICD-10-CM

## 2015-08-07 DIAGNOSIS — R1011 Right upper quadrant pain: Secondary | ICD-10-CM

## 2015-08-07 DIAGNOSIS — K2900 Acute gastritis without bleeding: Secondary | ICD-10-CM | POA: Diagnosis not present

## 2015-08-07 DIAGNOSIS — Z888 Allergy status to other drugs, medicaments and biological substances status: Secondary | ICD-10-CM | POA: Diagnosis not present

## 2015-08-07 DIAGNOSIS — E1165 Type 2 diabetes mellitus with hyperglycemia: Secondary | ICD-10-CM | POA: Diagnosis present

## 2015-08-07 DIAGNOSIS — Z8711 Personal history of peptic ulcer disease: Secondary | ICD-10-CM

## 2015-08-07 DIAGNOSIS — Z915 Personal history of self-harm: Secondary | ICD-10-CM

## 2015-08-07 DIAGNOSIS — B1920 Unspecified viral hepatitis C without hepatic coma: Secondary | ICD-10-CM | POA: Diagnosis present

## 2015-08-07 DIAGNOSIS — Z9119 Patient's noncompliance with other medical treatment and regimen: Secondary | ICD-10-CM | POA: Diagnosis not present

## 2015-08-07 DIAGNOSIS — K635 Polyp of colon: Secondary | ICD-10-CM

## 2015-08-07 DIAGNOSIS — K7031 Alcoholic cirrhosis of liver with ascites: Principal | ICD-10-CM

## 2015-08-07 DIAGNOSIS — E871 Hypo-osmolality and hyponatremia: Secondary | ICD-10-CM

## 2015-08-07 DIAGNOSIS — K8020 Calculus of gallbladder without cholecystitis without obstruction: Secondary | ICD-10-CM | POA: Diagnosis present

## 2015-08-07 DIAGNOSIS — R0602 Shortness of breath: Secondary | ICD-10-CM | POA: Diagnosis present

## 2015-08-07 DIAGNOSIS — F329 Major depressive disorder, single episode, unspecified: Secondary | ICD-10-CM | POA: Diagnosis present

## 2015-08-07 DIAGNOSIS — Z8041 Family history of malignant neoplasm of ovary: Secondary | ICD-10-CM

## 2015-08-07 DIAGNOSIS — Z833 Family history of diabetes mellitus: Secondary | ICD-10-CM

## 2015-08-07 DIAGNOSIS — Z0000 Encounter for general adult medical examination without abnormal findings: Secondary | ICD-10-CM

## 2015-08-07 DIAGNOSIS — E43 Unspecified severe protein-calorie malnutrition: Secondary | ICD-10-CM | POA: Diagnosis present

## 2015-08-07 DIAGNOSIS — Z79899 Other long term (current) drug therapy: Secondary | ICD-10-CM | POA: Diagnosis not present

## 2015-08-07 DIAGNOSIS — K2960 Other gastritis without bleeding: Secondary | ICD-10-CM | POA: Insufficient documentation

## 2015-08-07 DIAGNOSIS — K819 Cholecystitis, unspecified: Secondary | ICD-10-CM

## 2015-08-07 DIAGNOSIS — B1710 Acute hepatitis C without hepatic coma: Secondary | ICD-10-CM

## 2015-08-07 DIAGNOSIS — G8929 Other chronic pain: Secondary | ICD-10-CM | POA: Diagnosis present

## 2015-08-07 DIAGNOSIS — R945 Abnormal results of liver function studies: Secondary | ICD-10-CM

## 2015-08-07 DIAGNOSIS — R739 Hyperglycemia, unspecified: Secondary | ICD-10-CM

## 2015-08-07 DIAGNOSIS — F419 Anxiety disorder, unspecified: Secondary | ICD-10-CM | POA: Diagnosis present

## 2015-08-07 DIAGNOSIS — R740 Nonspecific elevation of levels of transaminase and lactic acid dehydrogenase [LDH]: Secondary | ICD-10-CM

## 2015-08-07 DIAGNOSIS — R7401 Elevation of levels of liver transaminase levels: Secondary | ICD-10-CM

## 2015-08-07 DIAGNOSIS — F418 Other specified anxiety disorders: Secondary | ICD-10-CM | POA: Diagnosis not present

## 2015-08-07 DIAGNOSIS — F332 Major depressive disorder, recurrent severe without psychotic features: Secondary | ICD-10-CM

## 2015-08-07 DIAGNOSIS — D696 Thrombocytopenia, unspecified: Secondary | ICD-10-CM

## 2015-08-07 DIAGNOSIS — F32A Depression, unspecified: Secondary | ICD-10-CM | POA: Diagnosis present

## 2015-08-07 DIAGNOSIS — K56600 Partial intestinal obstruction, unspecified as to cause: Secondary | ICD-10-CM

## 2015-08-07 DIAGNOSIS — R7989 Other specified abnormal findings of blood chemistry: Secondary | ICD-10-CM

## 2015-08-07 DIAGNOSIS — F1410 Cocaine abuse, uncomplicated: Secondary | ICD-10-CM

## 2015-08-07 DIAGNOSIS — K922 Gastrointestinal hemorrhage, unspecified: Secondary | ICD-10-CM | POA: Diagnosis not present

## 2015-08-07 DIAGNOSIS — Z90710 Acquired absence of both cervix and uterus: Secondary | ICD-10-CM

## 2015-08-07 DIAGNOSIS — R1084 Generalized abdominal pain: Secondary | ICD-10-CM

## 2015-08-07 DIAGNOSIS — K649 Unspecified hemorrhoids: Secondary | ICD-10-CM

## 2015-08-07 DIAGNOSIS — K659 Peritonitis, unspecified: Secondary | ICD-10-CM

## 2015-08-07 DIAGNOSIS — D72819 Decreased white blood cell count, unspecified: Secondary | ICD-10-CM

## 2015-08-07 DIAGNOSIS — R109 Unspecified abdominal pain: Secondary | ICD-10-CM | POA: Insufficient documentation

## 2015-08-07 DIAGNOSIS — R188 Other ascites: Secondary | ICD-10-CM

## 2015-08-07 LAB — CBC
HCT: 32 % — ABNORMAL LOW (ref 36.0–46.0)
HEMOGLOBIN: 10.7 g/dL — AB (ref 12.0–15.0)
MCH: 35.3 pg — AB (ref 26.0–34.0)
MCHC: 33.4 g/dL (ref 30.0–36.0)
MCV: 105.6 fL — AB (ref 78.0–100.0)
Platelets: 143 10*3/uL — ABNORMAL LOW (ref 150–400)
RBC: 3.03 MIL/uL — AB (ref 3.87–5.11)
RDW: 15.8 % — ABNORMAL HIGH (ref 11.5–15.5)
WBC: 4.6 10*3/uL (ref 4.0–10.5)

## 2015-08-07 LAB — URINALYSIS, ROUTINE W REFLEX MICROSCOPIC
BILIRUBIN URINE: NEGATIVE
HGB URINE DIPSTICK: NEGATIVE
KETONES UR: 40 mg/dL — AB
Leukocytes, UA: NEGATIVE
Nitrite: NEGATIVE
PH: 6.5 (ref 5.0–8.0)
Protein, ur: NEGATIVE mg/dL
Specific Gravity, Urine: 1.043 — ABNORMAL HIGH (ref 1.005–1.030)

## 2015-08-07 LAB — PHOSPHORUS: PHOSPHORUS: 2.6 mg/dL (ref 2.5–4.6)

## 2015-08-07 LAB — RAPID URINE DRUG SCREEN, HOSP PERFORMED
AMPHETAMINES: NOT DETECTED
Barbiturates: NOT DETECTED
Benzodiazepines: NOT DETECTED
COCAINE: NOT DETECTED
OPIATES: NOT DETECTED
TETRAHYDROCANNABINOL: NOT DETECTED

## 2015-08-07 LAB — URINE MICROSCOPIC-ADD ON

## 2015-08-07 LAB — CBG MONITORING, ED: Glucose-Capillary: 319 mg/dL — ABNORMAL HIGH (ref 65–99)

## 2015-08-07 LAB — PROTIME-INR
INR: 1.35 (ref 0.00–1.49)
Prothrombin Time: 16.8 seconds — ABNORMAL HIGH (ref 11.6–15.2)

## 2015-08-07 LAB — COMPREHENSIVE METABOLIC PANEL
ALBUMIN: 3.1 g/dL — AB (ref 3.5–5.0)
ALK PHOS: 111 U/L (ref 38–126)
ALT: 63 U/L — AB (ref 14–54)
AST: 45 U/L — AB (ref 15–41)
Anion gap: 13 (ref 5–15)
BUN: 8 mg/dL (ref 6–20)
CALCIUM: 8.9 mg/dL (ref 8.9–10.3)
CHLORIDE: 95 mmol/L — AB (ref 101–111)
CO2: 22 mmol/L (ref 22–32)
CREATININE: 0.55 mg/dL (ref 0.44–1.00)
GFR calc non Af Amer: >60 mL/min (ref >60)
GLUCOSE: 451 mg/dL — AB (ref 65–99)
Potassium: 3.9 mmol/L (ref 3.5–5.1)
SODIUM: 130 mmol/L — AB (ref 135–145)
Total Bilirubin: 1.5 mg/dL — ABNORMAL HIGH (ref 0.3–1.2)
Total Protein: 7.7 g/dL (ref 6.5–8.1)

## 2015-08-07 LAB — LIPASE, BLOOD: Lipase: 75 U/L — ABNORMAL HIGH (ref 11–51)

## 2015-08-07 LAB — GLUCOSE, CAPILLARY: Glucose-Capillary: 383 mg/dL — ABNORMAL HIGH (ref 65–99)

## 2015-08-07 LAB — ETHANOL

## 2015-08-07 LAB — HEMOGLOBIN AND HEMATOCRIT, BLOOD
HEMATOCRIT: 28.6 % — AB (ref 36.0–46.0)
HEMOGLOBIN: 10 g/dL — AB (ref 12.0–15.0)

## 2015-08-07 LAB — MAGNESIUM: Magnesium: 1.3 mg/dL — ABNORMAL LOW (ref 1.7–2.4)

## 2015-08-07 MED ORDER — CALCIUM CARBONATE ANTACID 500 MG PO CHEW: 3.0000 | CHEWABLE_TABLET | Freq: Four times a day (QID) | ORAL | Status: DC | PRN

## 2015-08-07 MED ORDER — SODIUM CHLORIDE 0.9 % IV BOLUS (SEPSIS)
500.0000 mL | Freq: Once | INTRAVENOUS | Status: AC
  Administered 2015-08-07: 500 mL via INTRAVENOUS

## 2015-08-07 MED ORDER — SODIUM CHLORIDE 0.9 % IV SOLN: INTRAVENOUS | Status: DC

## 2015-08-07 MED ORDER — OXYCODONE-ACETAMINOPHEN 5-325 MG PO TABS
1.0000 | ORAL_TABLET | Freq: Once | ORAL | Status: AC
  Administered 2015-08-07: 1 via ORAL
  Filled 2015-08-07: qty 1

## 2015-08-07 MED ORDER — INSULIN ASPART 100 UNIT/ML ~~LOC~~ SOLN
0.0000 [IU] | Freq: Three times a day (TID) | SUBCUTANEOUS | Status: DC
  Administered 2015-08-08: 2 [IU] via SUBCUTANEOUS
  Administered 2015-08-08 – 2015-08-09 (×2): 8 [IU] via SUBCUTANEOUS
  Administered 2015-08-09: 3 [IU] via SUBCUTANEOUS
  Administered 2015-08-09: 11 [IU] via SUBCUTANEOUS
  Administered 2015-08-10: 8 [IU] via SUBCUTANEOUS
  Administered 2015-08-10 (×2): 11 [IU] via SUBCUTANEOUS

## 2015-08-07 MED ORDER — PANTOPRAZOLE SODIUM 40 MG IV SOLR
40.0000 mg | Freq: Once | INTRAVENOUS | Status: AC
  Filled 2015-08-07: qty 40

## 2015-08-07 MED ORDER — INSULIN ASPART 100 UNIT/ML ~~LOC~~ SOLN: 8.0000 [IU] | Freq: Once | SUBCUTANEOUS | Status: DC

## 2015-08-07 MED ORDER — SODIUM CHLORIDE 0.9 % IV SOLN
50.0000 ug/h | INTRAVENOUS | Status: DC
  Administered 2015-08-07 – 2015-08-08 (×2): 50 ug/h via INTRAVENOUS
  Filled 2015-08-07 (×4): qty 1

## 2015-08-07 MED ORDER — INSULIN ASPART 100 UNIT/ML ~~LOC~~ SOLN
4.0000 [IU] | Freq: Once | SUBCUTANEOUS | Status: AC
  Administered 2015-08-07: 4 [IU] via SUBCUTANEOUS
  Filled 2015-08-07: qty 1

## 2015-08-07 MED ORDER — PANTOPRAZOLE SODIUM 40 MG IV SOLR: 40.0000 mg | INTRAVENOUS | Status: DC

## 2015-08-07 MED ORDER — INSULIN GLARGINE 100 UNIT/ML ~~LOC~~ SOLN
20.0000 [IU] | Freq: Every day | SUBCUTANEOUS | Status: DC
  Administered 2015-08-07 – 2015-08-09 (×3): 20 [IU] via SUBCUTANEOUS
  Filled 2015-08-07 (×4): qty 0.2

## 2015-08-07 MED ORDER — PANTOPRAZOLE SODIUM 40 MG IV SOLR
40.0000 mg | Freq: Two times a day (BID) | INTRAVENOUS | Status: DC
  Administered 2015-08-07: 40 mg via INTRAVENOUS
  Filled 2015-08-07 (×2): qty 40

## 2015-08-07 MED ORDER — SODIUM CHLORIDE 0.9 % IV SOLN
INTRAVENOUS | Status: DC
  Administered 2015-08-08: via INTRAVENOUS

## 2015-08-07 MED ORDER — HYDROCODONE-ACETAMINOPHEN 5-325 MG PO TABS
1.0000 | ORAL_TABLET | Freq: Once | ORAL | Status: AC
  Administered 2015-08-07: 1 via ORAL
  Filled 2015-08-07: qty 1

## 2015-08-07 MED ORDER — ONDANSETRON HCL 4 MG/2ML IJ SOLN: 4.0000 mg | Freq: Four times a day (QID) | INTRAMUSCULAR | Status: DC | PRN

## 2015-08-07 MED ORDER — ONDANSETRON HCL 4 MG PO TABS: 4.0000 mg | ORAL_TABLET | Freq: Four times a day (QID) | ORAL | Status: DC | PRN

## 2015-08-07 MED ORDER — SODIUM CHLORIDE 0.9 % IV SOLN
8.0000 mg/h | INTRAVENOUS | Status: DC
  Administered 2015-08-08 (×2): 8 mg/h via INTRAVENOUS
  Filled 2015-08-07 (×4): qty 80

## 2015-08-07 MED ORDER — PANTOPRAZOLE SODIUM 40 MG IV SOLR: 40.0000 mg | Freq: Two times a day (BID) | INTRAVENOUS | Status: DC

## 2015-08-07 MED ORDER — OCTREOTIDE LOAD VIA INFUSION
50.0000 ug | Freq: Once | INTRAVENOUS | Status: AC
  Administered 2015-08-07: 50 ug via INTRAVENOUS
  Filled 2015-08-07: qty 25

## 2015-08-07 MED ORDER — HYDROMORPHONE HCL 1 MG/ML IJ SOLN
1.0000 mg | INTRAMUSCULAR | Status: DC | PRN
  Administered 2015-08-07 – 2015-08-10 (×14): 1 mg via INTRAVENOUS
  Filled 2015-08-07 (×14): qty 1

## 2015-08-07 NOTE — H&P (Signed)
[facility] History and Physical  [name] [id number] DOB: March 27, 1966 DOA: 08/07/2015  Referring physician: Christ Kick, MD PCP: Cathlean Cower, MD   Chief Complaint: SOB.  HPI: [name] is a 50 y.o. female with a past medical history of alcoholic liver cirrhosis, hep C, pancreatitis, GERD, gallstones, anxiety and depression, hemochromatosis, pancytopenia, type 2 diabetes coming to the emergency department with dyspnea and worsening ascites/abdominal distention for about a week.  The patient also reports an episode of hematemesis over the weekend, followed by one episode of melena, without further bowel movements, and worsening of burning abdominal pain since then. Her hemoglobin dropped from 13.9 g/dL on 06/10/2015 to 10.7 g/dL on 08/07/2015.     Review of Systems:  Constitutional:  Positive fatigue. No weight loss, night sweats, Fevers, chills.  HEENT:  No headaches, Difficulty swallowing,Tooth/dental problems,Sore throat,  No sneezing, itching, ear ache, nasal congestion, post nasal drip,  Cardio-vascular:  No chest pain, Orthopnea, PND, swelling in lower extremities, anasarca, dizziness, palpitations  GI:  Positive indigestion, abdominal pain, nausea, vomiting,diarrhea , loss of appetite  Resp:  Positive dyspnea. No cough, wheezing or hemoptysis.  Skin:  No rash or lesions.  GU:  No dysuria, change in color of urine, no urgency or frequency. No flank pain.  Musculoskeletal:  No joint pain or swelling. No decreased range of motion. No back pain.  Psych:  No change in mood or affect. No depression or anxiety. No memory loss.   Past Medical History  Diagnosis Date  . Impaired glucose tolerance 11/07/2010  . Hepatitis C approx dx 2005    no tx to date  . Pancreatitis     HX of  . Alcohol dependency ([place])     at least [facility] admissions 2007- 09/2013 for detox.   Marland Kitchen GERD (gastroesophageal reflux disease)   . Chronic constipation   . Gallstones   . Drug  dependency ([place])     Hx of cocaine use  . Anxiety and depression 08/2011  . Rheumatoid factor positive   . Hemochromatosis     last phlebotomy tx ~ 2012. liver bx 2006  . Pancytopenia   . Abnormal vaginal bleeding     uterine fibroid  . Gallstone 2006  . Chronic abdominal pain 2011  . Cirrhosis ([place]) 11/09/2010  . Type II or unspecified type diabetes mellitus without mention of complication, uncontrolled 09/17/2011  . Major depression ([place]) 11/2012, 09/2013    [facility] admissions for this, suicide attempt by OD Ambien (2014), Prozac (2015),ETOHism   . Anxiety    Past Surgical History  Procedure Laterality Date  . Appendectomy    . Cesarean section  x 2  . Foot surgery  2013  . Abdominal hysterectomy  12/17/2011    Procedure: HYSTERECTOMY ABDOMINAL;  Surgeon: Gus Height, MD;  Location: [facility];  Service: Gynecology;  Laterality: N/A;  . Salpingoophorectomy  12/17/2011    Procedure: SALPINGO OOPHERECTOMY;  Surgeon: Gus Height, MD;  Location: [facility];  Service: Gynecology;  Laterality: Bilateral;  . Esophagogastroduodenoscopy N/A 12/10/2013    Procedure: ESOPHAGOGASTRODUODENOSCOPY (EGD);  Surgeon: Ladene Artist, MD;  Location: [facility];  Service: Endoscopy;  Laterality: N/A;  . Percutaneous liver biopsy  2011  . Colonoscopy N/A 02/18/2014    Procedure: COLONOSCOPY;  Surgeon: Gatha Mayer, MD;  Location: [facility];  Service: Endoscopy;  Laterality: N/A;   Social History:  reports that she has never smoked. She has never used smokeless tobacco. She reports that she drinks alcohol. She  reports that she does not use illicit drugs.  Allergies  Allergen Reactions  . Betadine [Povidone Iodine] Hives    Family History  Problem Relation Age of Onset  . Cancer Mother     ovarian  . Diabetes Father     Prior to Admission medications   Medication Sig Start Date End Date Taking? Authorizing Provider  calcium carbonate (TUMS EX) 750 MG chewable tablet Chew 3 tablets by mouth 4 (four) times  daily as needed for heartburn.   Yes Historical Provider, MD  diphenhydrAMINE (BENADRYL) 25 MG tablet Take 100 mg by mouth at bedtime.    Yes Historical Provider, MD  folic acid (FOLVITE) 1 MG tablet Take 1 tablet (1 mg total) by mouth daily. 05/27/15  Yes Lavina Hamman, MD  furosemide (LASIX) 40 MG tablet Take 1 tablet (40 mg total) by mouth 2 (two) times daily. 05/27/15  Yes Lavina Hamman, MD  ibuprofen (ADVIL,MOTRIN) 200 MG tablet Take 200 mg by mouth every 6 (six) hours as needed for moderate pain.    Yes Historical Provider, MD  KLOR-CON M10 10 MEQ tablet Take 10 mEq by mouth daily.  11/15/14  Yes Historical Provider, MD  lactulose (CHRONULAC) 10 GM/15ML solution Take 30 mLs (20 g total) by mouth 3 (three) times daily. Hold for the day after you have 3 soft stool/day. 05/27/15  Yes Lavina Hamman, MD  LANTUS 100 UNIT/ML injection INJECT 0.2 MLS (20 UNITS TOTAL) INTO THE SKIN AT BEDTIME. 05/01/15  Yes Biagio Borg, MD  metFORMIN (GLUCOPHAGE) 500 MG tablet TAKE 1 TABLET (500 MG TOTAL) BY MOUTH 2 (TWO) TIMES DAILY WITH A MEAL. 05/22/15  Yes Biagio Borg, MD  Multiple Vitamin (MULTIVITAMIN WITH MINERALS) TABS tablet Take 1 tablet by mouth daily.    Yes Historical Provider, MD  NOVOLOG FLEXPEN 100 UNIT/ML FlexPen INJECT 2 UNITS INTO SKIN 3 TIMES DAILY BEFORE MEALS Patient taking differently: INJECT 6 UNITS INTO SKIN 3 TIMES DAILY BEFORE MEALS 01/13/15  Yes Biagio Borg, MD  sertraline (ZOLOFT) 100 MG tablet Take 150 mg by mouth daily.   Yes Historical Provider, MD  spironolactone (ALDACTONE) 100 MG tablet Take 1 tablet (100 mg total) by mouth 2 (two) times daily. 05/27/15  Yes Lavina Hamman, MD  thiamine 100 MG tablet Take 1 tablet (100 mg total) by mouth daily. 05/27/15  Yes Lavina Hamman, MD  BD PEN NEEDLE NANO U/F 32G X 4 MM MISC 1 APPLICATION BY DOES NOT APPLY ROUTE 3 (THREE) TIMES DAILY. 07/13/15   Biagio Borg, MD  oxyCODONE (OXY IR/ROXICODONE) 5 MG immediate release tablet Take 1 tablet (5 mg total) by  mouth every 6 (six) hours as needed for severe pain. Patient not taking: Reported on 08/07/2015 05/27/15   Lavina Hamman, MD  rifaximin (XIFAXAN) 550 MG TABS tablet Take 1 tablet (550 mg total) by mouth 2 (two) times daily. 05/27/15   Lavina Hamman, MD   Physical Exam: Filed Vitals:   08/07/15 1509 08/07/15 1651 08/07/15 1748 08/07/15 2019  BP: 120/88  113/86 110/81  Pulse: 118  103 98  Temp: 98.6 F (37 C)     TempSrc:      Resp: 18  18 20   Weight:  53.071 kg (117 lb)    SpO2: 99%  100% 96%    Wt Readings from Last 3 Encounters:  08/07/15 53.071 kg (117 lb)  05/24/15 49.442 kg (109 lb)  05/26/15 49.442 kg (109 lb)  General:  Appears calm and comfortable Eyes: PERRL, mild icterus, normal lids, irises & conjunctiva ENT: grossly normal hearing, lips & tongue. Oral mucosa is dry.  Neck: no LAD, masses or thyromegaly Cardiovascular: RRR, no m/r/g. 1+ LE edema. Telemetry: SR, no arrhythmias  Respiratory: CTA bilaterally, no w/r/r. Normal respiratory effort. Abdomen: distended, ascites, soft, mild diffuse tenderness, no guarding, no rebound.  Skin: No rash or induration seen on limited exam Musculoskeletal: grossly normal tone BUE/BLE Psychiatric: grossly normal mood and affect, speech fluent and appropriate Neurologic: Awake, alert, oriented x4, grossly non-focal.          Labs on Admission:  Basic Metabolic Panel:  Recent Labs Lab 08/07/15 1307  NA 130*  K 3.9  CL 95*  CO2 22  GLUCOSE 451*  BUN 8  CREATININE 0.55  CALCIUM 8.9   Liver Function Tests:  Recent Labs Lab 08/07/15 1307  AST 45*  ALT 63*  ALKPHOS 111  BILITOT 1.5*  PROT 7.7  ALBUMIN 3.1*    Recent Labs Lab 08/07/15 1307  LIPASE 75*   CBC:  Recent Labs Lab 08/07/15 1307  WBC 4.6  HGB 10.7*  HCT 32.0*  MCV 105.6*  PLT 143*    BNP (last 3 results)  Recent Labs  05/23/15 2139  BNP 21.8    Radiological Exams on Admission: US Abdomen Complete  08/07/2015  CLINICAL DATA:   Abdominal pain and distention for 2 days. History of cirrhosis. EXAM: ABDOMEN ULTRASOUND COMPLETE COMPARISON:  Abdominal MRI 05/26/2015, CT 03/28/2015 FINDINGS: Gallbladder: Contracted with diffuse wall thickening wall thickness measures 4.5 mm. Cholelithiasis with largest stone measuring 4 mm. Common bile duct: Diameter: 3 mm at the porta hepatis, distal common bile duct is not well visualized. Liver: No focal lesion identified. Heterogeneous parenchyma with nodular contour. Normal directional flow in the imaged portal venous system. IVC: No abnormality visualized. Pancreas: Not well visualized. Spleen: 11.1 x 14.8 x 6.3 cm for a volume of 538 cc. Right Kidney: Length: 12.1 cm. Echogenicity within normal limits. No mass or hydronephrosis visualized. Left Kidney: Length: 12.6 cm. Echogenicity within normal limits. No mass or hydronephrosis visualized. Abdominal aorta: No aneurysm visualized. Other findings: Small to moderate perihepatic ascites. IMPRESSION: 1. Cirrhosis with splenomegaly. 2. Contracted gallbladder with gallstones and wall thickening. Wall thickening likely secondary to chronic liver disease. 3. Small to moderate perihepatic ascites. Electronically Signed   By: Jeb Levering M.D.   On: 08/07/2015 19:58     Assessment/Plan Principal Problem:   UGI bleed Not actively bleeding at this time. Admit to telemetry/inpatient. Keep NPO. Monitor H/H closely. Continue gentle hydration. Pantoprazole and Octreotide infusion.  Dr. Silverio Decamp was consulted and will evaluate the patient in AM.  Active Problems:   Alcoholic cirrhosis of liver with ascites (HCC) Check PT/INR. Monitor liver function. Analgesics for abdominal pain.    Poorly controlled type 2 diabetes mellitus (HCC) Continue Lantus. Gentle hydration. CBG monitoring with regular insulin sliding scale.    Anxiety and depression Resume Zoloft once tolerating oral intake.     GERD (gastroesophageal reflux disease) Continue  proton pump inhibitor.    Anemia Monitor hematocrit and hemoglobin.     Code Status: Full code.  DVT Prophylaxis: SCDs Family Communication:  Disposition Plan: Admit for further evaluation and treatment.  Time spent: About 70 minutes were used during the process of this admission.  Reubin Milan, MD [facility] Pager [phone].

## 2015-08-07 NOTE — ED Notes (Signed)
Provided warm blanket while waiting in the lobby.

## 2015-08-07 NOTE — ED Notes (Signed)
Pt c/o increasing abdominal distention/ascities and SOB x 1 week.  Pain score 10/10.  Pt reports "they just doubled my water pill."  Pt reports an episode of dark stool last week.  Sts "it was just that once."  Hx of cirrhosis.

## 2015-08-07 NOTE — ED Provider Notes (Signed)
CSN: [id number]     Arrival date & time 08/07/15  1215 History   First MD Initiated Contact with Patient 08/07/15 1541     Chief Complaint  Patient presents with  . Ascites  . Shortness of Breath     (Consider location/radiation/quality/duration/timing/severity/associated sxs/prior Treatment) HPI  [name] is a 50 y.o. female who presents for evaluation of persistent worsening swelling in her abdomen, which causes pain. She also has early satiety, occasional vomiting, and feel short of breath. She denies fever, chills, diarrhea, dysuria, urinary frequency, gait disorder, confusion or headache. She was treated in the hospital with paracentesis for abdominal ascites associated with alcoholic cirrhosis. About 4 months ago. She has not followed up with gastroenterology since that time, but states that she is taking her medicines as directed. She has recently changed to a new primary care doctor, but has not seen him yet. She feels like she has gained about 30 pounds in the last 4 months. There are no other no modifying factors.    Past Medical History  Diagnosis Date  . Impaired glucose tolerance 11/07/2010  . Hepatitis C approx dx 2005    no tx to date  . Pancreatitis     HX of  . Alcohol dependency ([place])     at least [place] admissions 2007- 09/2013 for detox.   Marland Kitchen GERD (gastroesophageal reflux disease)   . Chronic constipation   . Gallstones   . Drug dependency ([place])     Hx of cocaine use  . Anxiety and depression 08/2011  . Rheumatoid factor positive   . Hemochromatosis     last phlebotomy tx ~ 2012. liver bx 2006  . Pancytopenia   . Abnormal vaginal bleeding     uterine fibroid  . Gallstone 2006  . Chronic abdominal pain 2011  . Cirrhosis ([place]) 11/09/2010  . Type II or unspecified type diabetes mellitus without mention of complication, uncontrolled 09/17/2011  . Major depression ([place]) 11/2012, 09/2013    [facility] admissions for this, suicide attempt by OD Ambien (2014), Prozac  (2015),ETOHism   . Anxiety    Past Surgical History  Procedure Laterality Date  . Appendectomy    . Cesarean section  x 2  . Foot surgery  2013  . Abdominal hysterectomy  12/17/2011    Procedure: HYSTERECTOMY ABDOMINAL;  Surgeon: Gus Height, MD;  Location: [facility];  Service: Gynecology;  Laterality: N/A;  . Salpingoophorectomy  12/17/2011    Procedure: SALPINGO OOPHERECTOMY;  Surgeon: Gus Height, MD;  Location: [facility];  Service: Gynecology;  Laterality: Bilateral;  . Esophagogastroduodenoscopy N/A 12/10/2013    Procedure: ESOPHAGOGASTRODUODENOSCOPY (EGD);  Surgeon: Ladene Artist, MD;  Location: [facility];  Service: Endoscopy;  Laterality: N/A;  . Percutaneous liver biopsy  2011  . Colonoscopy N/A 02/18/2014    Procedure: COLONOSCOPY;  Surgeon: Gatha Mayer, MD;  Location: [facility];  Service: Endoscopy;  Laterality: N/A;   Family History  Problem Relation Age of Onset  . Cancer Mother     ovarian  . Diabetes Father    Social History  Substance Use Topics  . Smoking status: Never Smoker   . Smokeless tobacco: Never Used  . Alcohol Use: Yes     Comment: one pint vodka daily years   OB History    Gravida Para Term Preterm AB TAB SAB Ectopic Multiple Living   2 2 2       3      Review of Systems  All other systems  reviewed and are negative.     Allergies  Betadine  Home Medications   Prior to Admission medications   Medication Sig Start Date End Date Taking? Authorizing Provider  calcium carbonate (TUMS EX) 750 MG chewable tablet Chew 3 tablets by mouth 4 (four) times daily as needed for heartburn.   Yes Historical Provider, MD  diphenhydrAMINE (BENADRYL) 25 MG tablet Take 100 mg by mouth at bedtime.    Yes Historical Provider, MD  folic acid (FOLVITE) 1 MG tablet Take 1 tablet (1 mg total) by mouth daily. 05/27/15  Yes Lavina Hamman, MD  furosemide (LASIX) 40 MG tablet Take 1 tablet (40 mg total) by mouth 2 (two) times daily. 05/27/15  Yes Lavina Hamman, MD   ibuprofen (ADVIL,MOTRIN) 200 MG tablet Take 200 mg by mouth every 6 (six) hours as needed for moderate pain.    Yes Historical Provider, MD  KLOR-CON M10 10 MEQ tablet Take 10 mEq by mouth daily.  11/15/14  Yes Historical Provider, MD  lactulose (CHRONULAC) 10 GM/15ML solution Take 30 mLs (20 g total) by mouth 3 (three) times daily. Hold for the day after you have 3 soft stool/day. 05/27/15  Yes Lavina Hamman, MD  LANTUS 100 UNIT/ML injection INJECT 0.2 MLS (20 UNITS TOTAL) INTO THE SKIN AT BEDTIME. 05/01/15  Yes Biagio Borg, MD  metFORMIN (GLUCOPHAGE) 500 MG tablet TAKE 1 TABLET (500 MG TOTAL) BY MOUTH 2 (TWO) TIMES DAILY WITH A MEAL. 05/22/15  Yes Biagio Borg, MD  Multiple Vitamin (MULTIVITAMIN WITH MINERALS) TABS tablet Take 1 tablet by mouth daily.    Yes Historical Provider, MD  NOVOLOG FLEXPEN 100 UNIT/ML FlexPen INJECT 2 UNITS INTO SKIN 3 TIMES DAILY BEFORE MEALS Patient taking differently: INJECT 6 UNITS INTO SKIN 3 TIMES DAILY BEFORE MEALS 01/13/15  Yes Biagio Borg, MD  sertraline (ZOLOFT) 100 MG tablet Take 150 mg by mouth daily.   Yes Historical Provider, MD  spironolactone (ALDACTONE) 100 MG tablet Take 1 tablet (100 mg total) by mouth 2 (two) times daily. 05/27/15  Yes Lavina Hamman, MD  thiamine 100 MG tablet Take 1 tablet (100 mg total) by mouth daily. 05/27/15  Yes Lavina Hamman, MD  BD PEN NEEDLE NANO U/F 32G X 4 MM MISC 1 APPLICATION BY DOES NOT APPLY ROUTE 3 (THREE) TIMES DAILY. 07/13/15   Biagio Borg, MD  oxyCODONE (OXY IR/ROXICODONE) 5 MG immediate release tablet Take 1 tablet (5 mg total) by mouth every 6 (six) hours as needed for severe pain. Patient not taking: Reported on 08/07/2015 05/27/15   Lavina Hamman, MD  rifaximin (XIFAXAN) 550 MG TABS tablet Take 1 tablet (550 mg total) by mouth 2 (two) times daily. 05/27/15   Lavina Hamman, MD   BP 110/81 mmHg  Pulse 98  Temp(Src) 98.6 F (37 C) (Oral)  Resp 20  Wt 117 lb (53.071 kg)  SpO2 96%  LMP 11/01/2011 Physical Exam   Constitutional: She is oriented to person, place, and time. She appears well-developed and well-nourished. No distress.  HENT:  Head: Normocephalic and atraumatic.  Right Ear: External ear normal.  Left Ear: External ear normal.  Eyes: Conjunctivae and EOM are normal. Pupils are equal, round, and reactive to light.  Neck: Normal range of motion and phonation normal. Neck supple.  Cardiovascular: Normal rate, regular rhythm and normal heart sounds.   Pulmonary/Chest: Effort normal and breath sounds normal. She exhibits no bony tenderness.  Abdominal: Soft. She exhibits distension.  There is tenderness (Diffuse, moderate. No palpable organomegaly.).  Musculoskeletal: Normal range of motion. She exhibits edema (1-2+ bilateral lower extremities).  Neurological: She is alert and oriented to person, place, and time. No cranial nerve deficit or sensory deficit. She exhibits normal muscle tone. Coordination normal.  Skin: Skin is warm, dry and intact.  Psychiatric: She has a normal mood and affect. Her behavior is normal. Judgment and thought content normal.  Nursing note and vitals reviewed.   ED Course  Procedures (including critical care time)  Initial clinical impression- worsening abdominal ascites, with noncompliance with usual medical treatment. She has not followed up with gastroenterology as directed on recent hospitalization.  Medications  HYDROcodone-acetaminophen (NORCO/VICODIN) 5-325 MG per tablet 1 tablet (1 tablet Oral Given 08/07/15 1733)  oxyCODONE-acetaminophen (PERCOCET/ROXICET) 5-325 MG per tablet 1 tablet (1 tablet Oral Given 08/07/15 1939)    Patient Vitals for the past 24 hrs:  BP Temp Temp src Pulse Resp SpO2 Weight  08/07/15 2019 110/81 mmHg - - 98 20 96 % -  08/07/15 1748 113/86 mmHg - - 103 18 100 % -  08/07/15 1651 - - - - - - 117 lb (53.071 kg)  08/07/15 1509 120/88 mmHg 98.6 F (37 C) - 118 18 99 % -  08/07/15 1243 116/86 mmHg 98.5 F (36.9 C) Oral 112 18 100 %  -   Weight today is up 3.5 kg, from February 2017.  8:36 PM-Consult complete with Hospitalist. Patient case explained and discussed. He agrees to admit patient for further evaluation and treatment. Call ended at 2040  9:05 PM Reevaluation with update and discussion. After initial assessment and treatment, an updated evaluation reveals no additional complaints. Patient updated on findings. Shawntia Mangal L   \  Labs Review Labs Reviewed  LIPASE, BLOOD - Abnormal; Notable for the following:    Lipase 75 (*)    All other components within normal limits  COMPREHENSIVE METABOLIC PANEL - Abnormal; Notable for the following:    Sodium 130 (*)    Chloride 95 (*)    Glucose, Bld 451 (*)    Albumin 3.1 (*)    AST 45 (*)    ALT 63 (*)    Total Bilirubin 1.5 (*)    All other components within normal limits  CBC - Abnormal; Notable for the following:    RBC 3.03 (*)    Hemoglobin 10.7 (*)    HCT 32.0 (*)    MCV 105.6 (*)    MCH 35.3 (*)    RDW 15.8 (*)    Platelets 143 (*)    All other components within normal limits  URINALYSIS, ROUTINE W REFLEX MICROSCOPIC (NOT AT [facility]) - Abnormal; Notable for the following:    Specific Gravity, Urine 1.043 (*)    Glucose, UA >1000 (*)    Ketones, ur 40 (*)    All other components within normal limits  URINE MICROSCOPIC-ADD ON - Abnormal; Notable for the following:    Squamous Epithelial / LPF 0-5 (*)    Bacteria, UA FEW (*)    All other components within normal limits  ETHANOL  URINE RAPID DRUG SCREEN, HOSP PERFORMED  POC OCCULT BLOOD, ED    Imaging Review US Abdomen Complete  08/07/2015  CLINICAL DATA:  Abdominal pain and distention for 2 days. History of cirrhosis. EXAM: ABDOMEN ULTRASOUND COMPLETE COMPARISON:  Abdominal MRI 05/26/2015, CT 03/28/2015 FINDINGS: Gallbladder: Contracted with diffuse wall thickening wall thickness measures 4.5 mm. Cholelithiasis with largest stone measuring 4 mm. Common bile  duct: Diameter: 3 mm at the porta  hepatis, distal common bile duct is not well visualized. Liver: No focal lesion identified. Heterogeneous parenchyma with nodular contour. Normal directional flow in the imaged portal venous system. IVC: No abnormality visualized. Pancreas: Not well visualized. Spleen: 11.1 x 14.8 x 6.3 cm for a volume of 538 cc. Right Kidney: Length: 12.1 cm. Echogenicity within normal limits. No mass or hydronephrosis visualized. Left Kidney: Length: 12.6 cm. Echogenicity within normal limits. No mass or hydronephrosis visualized. Abdominal aorta: No aneurysm visualized. Other findings: Small to moderate perihepatic ascites. IMPRESSION: 1. Cirrhosis with splenomegaly. 2. Contracted gallbladder with gallstones and wall thickening. Wall thickening likely secondary to chronic liver disease. 3. Small to moderate perihepatic ascites. Electronically Signed   By: Jeb Levering M.D.   On: 08/07/2015 19:58   I have personally reviewed and evaluated these images and lab results as part of my medical decision-making.   EKG Interpretation None      MDM   Final diagnoses:  Generalized abdominal pain  Alcoholic cirrhosis of liver with ascites (HCC)  Hyponatremia  Hyperglycemia  Anemia, unspecified anemia type    Worsening ascites, secondary to alcohol cirrhosis. Doubt acute bacterial peritonitis. Intravascular volume depletion is likely with specific gravity elevated at 1.04. 3. Urinary glucose is elevated. Serum glucose is high, 451. Anion gap is normal. Sodium low 130. Hemoglobin low 10.7, down from 13.9, 2 months ago. Hyperglycemia with normal anion gap. Mortimer Fries ordered to evaluate for worsening liver failure/hepatomegaly. No acute respiratory compromise related to ascites. Abdominal ultrasound with gallstones, and wall thickening likely related to chronic liver disease. Ascites present on ultrasound imaging, volume, not stated. Spleen is enlarged.   Nursing Notes Reviewed/ Care Coordinated, and agree without  changes. Applicable Imaging Reviewed.  Interpretation of Laboratory Data incorporated into ED treatment   Ran: Admit  Daleen Bo, MD 08/07/15 2106

## 2015-08-08 ENCOUNTER — Inpatient Hospital Stay (HOSPITAL_COMMUNITY): Payer: Medicaid Other

## 2015-08-08 ENCOUNTER — Encounter (HOSPITAL_COMMUNITY)
Admission: EM | Disposition: A | Discharge status: Home / Self Care | Payer: Self-pay | Source: Home / Self Care | Attending: Internal Medicine

## 2015-08-08 ENCOUNTER — Inpatient Hospital Stay (HOSPITAL_COMMUNITY): Payer: Medicaid Other | Admitting: Anesthesiology

## 2015-08-08 ENCOUNTER — Encounter (HOSPITAL_COMMUNITY): Payer: Self-pay | Admitting: *Deleted

## 2015-08-08 DIAGNOSIS — K922 Gastrointestinal hemorrhage, unspecified: Secondary | ICD-10-CM

## 2015-08-08 DIAGNOSIS — B1710 Acute hepatitis C without hepatic coma: Secondary | ICD-10-CM

## 2015-08-08 DIAGNOSIS — K2960 Other gastritis without bleeding: Secondary | ICD-10-CM | POA: Insufficient documentation

## 2015-08-08 DIAGNOSIS — K2900 Acute gastritis without bleeding: Secondary | ICD-10-CM

## 2015-08-08 DIAGNOSIS — I8510 Secondary esophageal varices without bleeding: Secondary | ICD-10-CM | POA: Insufficient documentation

## 2015-08-08 DIAGNOSIS — K7031 Alcoholic cirrhosis of liver with ascites: Principal | ICD-10-CM

## 2015-08-08 HISTORY — PX: ESOPHAGOGASTRODUODENOSCOPY (EGD) WITH PROPOFOL: SHX5813

## 2015-08-08 LAB — COMPREHENSIVE METABOLIC PANEL
ALBUMIN: 2.7 g/dL — AB (ref 3.5–5.0)
ALK PHOS: 86 U/L (ref 38–126)
ALT: 54 U/L (ref 14–54)
AST: 45 U/L — AB (ref 15–41)
Anion gap: 7 (ref 5–15)
BUN: 5 mg/dL — AB (ref 6–20)
CALCIUM: 7.9 mg/dL — AB (ref 8.9–10.3)
CHLORIDE: 96 mmol/L — AB (ref 101–111)
CO2: 25 mmol/L (ref 22–32)
CREATININE: 0.48 mg/dL (ref 0.44–1.00)
GFR calc Af Amer: >60 mL/min (ref >60)
GFR calc non Af Amer: >60 mL/min (ref >60)
GLUCOSE: 290 mg/dL — AB (ref 65–99)
Potassium: 3.3 mmol/L — ABNORMAL LOW (ref 3.5–5.1)
SODIUM: 128 mmol/L — AB (ref 135–145)
Total Bilirubin: 1.3 mg/dL — ABNORMAL HIGH (ref 0.3–1.2)
Total Protein: 6.8 g/dL (ref 6.5–8.1)

## 2015-08-08 LAB — GRAM STAIN

## 2015-08-08 LAB — CBC WITH DIFFERENTIAL/PLATELET
BASOS ABS: 0 10*3/uL (ref 0.0–0.1)
BASOS PCT: 1 %
EOS ABS: 0.1 10*3/uL (ref 0.0–0.7)
EOS PCT: 3 %
HCT: 29.1 % — ABNORMAL LOW (ref 36.0–46.0)
HEMOGLOBIN: 10.1 g/dL — AB (ref 12.0–15.0)
LYMPHS ABS: 0.8 10*3/uL (ref 0.7–4.0)
Lymphocytes Relative: 25 %
MCH: 35.9 pg — AB (ref 26.0–34.0)
MCHC: 34.7 g/dL (ref 30.0–36.0)
MCV: 103.6 fL — ABNORMAL HIGH (ref 78.0–100.0)
Monocytes Absolute: 0.5 10*3/uL (ref 0.1–1.0)
Monocytes Relative: 16 %
NEUTROS PCT: 55 %
Neutro Abs: 1.8 10*3/uL (ref 1.7–7.7)
PLATELETS: 121 10*3/uL — AB (ref 150–400)
RBC: 2.81 MIL/uL — AB (ref 3.87–5.11)
RDW: 15.5 % (ref 11.5–15.5)
WBC: 3.3 10*3/uL — AB (ref 4.0–10.5)

## 2015-08-08 LAB — BODY FLUID CELL COUNT WITH DIFFERENTIAL
Lymphs, Fluid: 15 %
Monocyte-Macrophage-Serous Fluid: 77 % (ref 50–90)
NEUTROPHIL FLUID: 8 % (ref 0–25)
Total Nucleated Cell Count, Fluid: 218 cu mm (ref 0–1000)

## 2015-08-08 LAB — GLUCOSE, CAPILLARY
GLUCOSE-CAPILLARY: 252 mg/dL — AB (ref 65–99)
GLUCOSE-CAPILLARY: 98 mg/dL (ref 65–99)
GLUCOSE-CAPILLARY: 133 mg/dL — AB (ref 65–99)
GLUCOSE-CAPILLARY: 283 mg/dL — AB (ref 65–99)

## 2015-08-08 LAB — LACTATE DEHYDROGENASE, PLEURAL OR PERITONEAL FLUID: LD, Fluid: 29 U/L — ABNORMAL HIGH (ref 3–23)

## 2015-08-08 SURGERY — ESOPHAGOGASTRODUODENOSCOPY (EGD) WITH PROPOFOL
Anesthesia: Moderate Sedation

## 2015-08-08 SURGERY — ESOPHAGOGASTRODUODENOSCOPY (EGD) WITH PROPOFOL
Anesthesia: Monitor Anesthesia Care

## 2015-08-08 MED ORDER — PROPOFOL 10 MG/ML IV BOLUS
INTRAVENOUS | Status: AC
  Filled 2015-08-08: qty 20

## 2015-08-08 MED ORDER — LACTATED RINGERS IV SOLN
INTRAVENOUS | Status: DC | PRN
  Administered 2015-08-08: 13:00:00 via INTRAVENOUS

## 2015-08-08 MED ORDER — PROPOFOL 10 MG/ML IV BOLUS
INTRAVENOUS | Status: DC | PRN
  Administered 2015-08-08: 20 mg via INTRAVENOUS
  Administered 2015-08-08: 40 mg via INTRAVENOUS
  Administered 2015-08-08 (×3): 20 mg via INTRAVENOUS

## 2015-08-08 MED ORDER — DIPHENHYDRAMINE HCL 25 MG PO CAPS
25.0000 mg | ORAL_CAPSULE | Freq: Once | ORAL | Status: AC
  Administered 2015-08-08: 25 mg via ORAL
  Filled 2015-08-08: qty 1

## 2015-08-08 MED ORDER — ALBUMIN HUMAN 25 % IV SOLN
50.0000 g | Freq: Once | INTRAVENOUS | Status: DC
  Filled 2015-08-08: qty 200

## 2015-08-08 MED ORDER — POTASSIUM CHLORIDE CRYS ER 20 MEQ PO TBCR
40.0000 meq | EXTENDED_RELEASE_TABLET | Freq: Once | ORAL | Status: AC
  Administered 2015-08-08: 40 meq via ORAL
  Filled 2015-08-08: qty 2

## 2015-08-08 MED ORDER — LACTATED RINGERS IV SOLN
INTRAVENOUS | Status: DC
  Administered 2015-08-08: 1000 mL via INTRAVENOUS

## 2015-08-08 MED ORDER — DIPHENHYDRAMINE HCL 25 MG PO CAPS
25.0000 mg | ORAL_CAPSULE | Freq: Three times a day (TID) | ORAL | Status: DC | PRN
  Administered 2015-08-08: 25 mg via ORAL
  Filled 2015-08-08: qty 1

## 2015-08-08 MED ORDER — DEXTROSE 5 % IV SOLN
1.0000 g | Freq: Once | INTRAVENOUS | Status: DC
  Filled 2015-08-08: qty 10

## 2015-08-08 MED ORDER — MAGNESIUM SULFATE 2 GM/50ML IV SOLN
2.0000 g | Freq: Once | INTRAVENOUS | Status: AC
Start: 2015-08-08 — End: 2015-08-08
  Administered 2015-08-08: 2 g via INTRAVENOUS
  Filled 2015-08-08: qty 50

## 2015-08-08 SURGICAL SUPPLY — 14 items

## 2015-08-08 NOTE — Care Management Note (Signed)
Case Management Note  Patient Details  Name: [name] MRN: [id number] Date of Birth: [birth year]  Subjective/Objective:   49 y/o f admitted w/UGIB. From home.                 Action/Plan:d/c plan home.   Expected Discharge Date:   (unknown)               Expected Discharge Plan:  Home/Self Care  In-House Referral:     Discharge planning Services  CM Consult  Post Acute Care Choice:    Choice offered to:     DME Arranged:    DME Agency:     HH Arranged:    HH Agency:     Status of Service:  In process, will continue to follow  Medicare Important Message Given:    Date Medicare IM Given:    Medicare IM give by:    Date Additional Medicare IM Given:    Additional Medicare Important Message give by:     If discussed at [place] of Stay Meetings, dates discussed:    Additional Comments:  Dessa Phi, RN 08/08/2015, 4:07 PM

## 2015-08-08 NOTE — Consult Note (Signed)
Consultation  Referring Provider: [facility] Olevia Bowens Primary Care Physician:  Cathlean Cower, MD Primary Gastroenterologist:  Former Dr Deatra Ina  Reason for Consultation:   Cirrhosis/melena  HPI: [name] is a 50 y.o. female  admitted last evening through the emergency room after presenting with complaints of progressive abdominal distention and discomfort causing shortness of breath. She also had an episode of hematemesis over this past weekend and melena. Patient known previously to Dr. Deatra Ina though has not had much office follow-up and has primarily been seen in the hospital over the past few years. She has history of alcohol and hepatitis C induced cirrhosis, decompensated with thrombocytopenia, ascites. She had been maintained on Lasix and Aldactone at home and says she has been taking her medications. She relates that over the past couple of weeks she has gained about 16 pounds and has had progressive constant abdominal discomfort No fever or chills. She states that on Saturday 08/05/2015 she had several episodes of vomiting and vomited up black material. She denies any bright red blood. She also had 1 or 2 episodes of black stools and says the vomiting and the melena both resolved. She had a fairly normal-appearing bowel movement yesterday. She says she has not been drinking over the past 1 month. She has been taking ibuprofen generally 6 per day for abdominal discomfort. Last EGD done in August 2015 no varices noted she did have a Mallory-Weiss tear and had 3 nonbleeding gastric ulcers and diffuse gastritis.  Patient had ultrasound last evening that showed a contracted gallbladder and a few gallstones evidence of cirrhosis mild splenomegaly and a small amount of ascites.. Hemoglobin 10.7 on admission 10.1 this morning. He has been hemodynamically stable. She was started on octreotide last evening as well as a Protonix infusion.   Past Medical History  Diagnosis Date  . Impaired  glucose tolerance 11/07/2010  . Hepatitis C approx dx 2005    no tx to date  . Pancreatitis     HX of  . Alcohol dependency ([place])     at least [place] admissions 2007- 09/2013 for detox.   Marland Kitchen GERD (gastroesophageal reflux disease)   . Chronic constipation   . Gallstones   . Drug dependency ([place])     Hx of cocaine use  . Anxiety and depression 08/2011  . Rheumatoid factor positive   . Hemochromatosis     last phlebotomy tx ~ 2012. liver bx 2006  . Pancytopenia   . Abnormal vaginal bleeding     uterine fibroid  . Gallstone 2006  . Chronic abdominal pain 2011  . Cirrhosis ([place]) 11/09/2010  . Type II or unspecified type diabetes mellitus without mention of complication, uncontrolled 09/17/2011  . Major depression ([place]) 11/2012, 09/2013    [place] admissions for this, suicide attempt by OD Ambien (2014), Prozac (2015),ETOHism   . Anxiety     Past Surgical History  Procedure Laterality Date  . Appendectomy    . Cesarean section  x 2  . Foot surgery  2013  . Abdominal hysterectomy  12/17/2011    Procedure: HYSTERECTOMY ABDOMINAL;  Surgeon: Gus Height, MD;  Location: [facility];  Service: Gynecology;  Laterality: N/A;  . Salpingoophorectomy  12/17/2011    Procedure: SALPINGO OOPHERECTOMY;  Surgeon: Gus Height, MD;  Location: [facility];  Service: Gynecology;  Laterality: Bilateral;  . Esophagogastroduodenoscopy N/A 12/10/2013    Procedure: ESOPHAGOGASTRODUODENOSCOPY (EGD);  Surgeon: Ladene Artist, MD;  Location: [facility];  Service: Endoscopy;  Laterality: N/A;  .  Percutaneous liver biopsy  2011  . Colonoscopy N/A 02/18/2014    Procedure: COLONOSCOPY;  Surgeon: Gatha Mayer, MD;  Location: [facility];  Service: Endoscopy;  Laterality: N/A;    Prior to Admission medications   Medication Sig Start Date End Date Taking? Authorizing Provider  calcium carbonate (TUMS EX) 750 MG chewable tablet Chew 3 tablets by mouth 4 (four) times daily as needed for heartburn.   Yes Historical Provider, MD    diphenhydrAMINE (BENADRYL) 25 MG tablet Take 100 mg by mouth at bedtime.    Yes Historical Provider, MD  folic acid (FOLVITE) 1 MG tablet Take 1 tablet (1 mg total) by mouth daily. 05/27/15  Yes Lavina Hamman, MD  furosemide (LASIX) 40 MG tablet Take 1 tablet (40 mg total) by mouth 2 (two) times daily. 05/27/15  Yes Lavina Hamman, MD  ibuprofen (ADVIL,MOTRIN) 200 MG tablet Take 200 mg by mouth every 6 (six) hours as needed for moderate pain.    Yes Historical Provider, MD  KLOR-CON M10 10 MEQ tablet Take 10 mEq by mouth daily.  11/15/14  Yes Historical Provider, MD  lactulose (CHRONULAC) 10 GM/15ML solution Take 30 mLs (20 g total) by mouth 3 (three) times daily. Hold for the day after you have 3 soft stool/day. 05/27/15  Yes Lavina Hamman, MD  LANTUS 100 UNIT/ML injection INJECT 0.2 MLS (20 UNITS TOTAL) INTO THE SKIN AT BEDTIME. 05/01/15  Yes Biagio Borg, MD  metFORMIN (GLUCOPHAGE) 500 MG tablet TAKE 1 TABLET (500 MG TOTAL) BY MOUTH 2 (TWO) TIMES DAILY WITH A MEAL. 05/22/15  Yes Biagio Borg, MD  Multiple Vitamin (MULTIVITAMIN WITH MINERALS) TABS tablet Take 1 tablet by mouth daily.    Yes Historical Provider, MD  NOVOLOG FLEXPEN 100 UNIT/ML FlexPen INJECT 2 UNITS INTO SKIN 3 TIMES DAILY BEFORE MEALS Patient taking differently: INJECT 6 UNITS INTO SKIN 3 TIMES DAILY BEFORE MEALS 01/13/15  Yes Biagio Borg, MD  sertraline (ZOLOFT) 100 MG tablet Take 150 mg by mouth daily.   Yes Historical Provider, MD  spironolactone (ALDACTONE) 100 MG tablet Take 1 tablet (100 mg total) by mouth 2 (two) times daily. 05/27/15  Yes Lavina Hamman, MD  thiamine 100 MG tablet Take 1 tablet (100 mg total) by mouth daily. 05/27/15  Yes Lavina Hamman, MD  BD PEN NEEDLE NANO U/F 32G X 4 MM MISC 1 APPLICATION BY DOES NOT APPLY ROUTE 3 (THREE) TIMES DAILY. 07/13/15   Biagio Borg, MD  oxyCODONE (OXY IR/ROXICODONE) 5 MG immediate release tablet Take 1 tablet (5 mg total) by mouth every 6 (six) hours as needed for severe pain. Patient  not taking: Reported on 08/07/2015 05/27/15   Lavina Hamman, MD  rifaximin (XIFAXAN) 550 MG TABS tablet Take 1 tablet (550 mg total) by mouth 2 (two) times daily. 05/27/15   Lavina Hamman, MD    Current Facility-Administered Medications  Medication Dose Route Frequency Provider Last Rate Last Dose  . 0.9 %  sodium chloride infusion   Intravenous Continuous Reubin Milan, MD      . 0.9 %  sodium chloride infusion   Intravenous Continuous Reubin Milan, MD 50 mL/hr at 08/08/15 0008    . calcium carbonate (TUMS - dosed in mg elemental calcium) chewable tablet 600 mg of elemental calcium  3 tablet Oral QID PRN Reubin Milan, MD      . HYDROmorphone (DILAUDID) injection 1 mg  1 mg Intravenous Q4H PRN Shanon Brow  Judieth Keens, MD   1 mg at 08/08/15 0332  . insulin aspart (novoLOG) injection 0-15 Units  0-15 Units Subcutaneous TID WC Reubin Milan, MD      . insulin glargine (LANTUS) injection 20 Units  20 Units Subcutaneous QHS Reubin Milan, MD   20 Units at 08/07/15 2332  . octreotide (SANDOSTATIN) 500 mcg in sodium chloride 0.9 % 250 mL (2 mcg/mL) infusion  50 mcg/hr Intravenous Continuous Reubin Milan, MD 25 mL/hr at 08/07/15 2225 50 mcg/hr at 08/07/15 2225  . ondansetron (ZOFRAN) tablet 4 mg  4 mg Oral Q6H PRN Reubin Milan, MD       Or  . ondansetron [facility]) injection 4 mg  4 mg Intravenous Q6H PRN Reubin Milan, MD      . pantoprazole (PROTONIX) 80 mg in sodium chloride 0.9 % 250 mL (0.32 mg/mL) infusion  8 mg/hr Intravenous Continuous Reubin Milan, MD 25 mL/hr at 08/08/15 0009 8 mg/hr at 08/08/15 0009  . [START ON 08/11/2015] pantoprazole (PROTONIX) injection 40 mg  40 mg Intravenous Q12H Reubin Milan, MD        Allergies as of 08/07/2015 - Review Complete 08/07/2015  Allergen Reaction Noted  . Betadine [povidone iodine] Hives 12/24/2013    Family History  Problem Relation Age of Onset  . Cancer Mother     ovarian  . Diabetes Father      Social History   Social History  . Marital Status: Married    Spouse Name: N/A  . Number of Children: 3  . Years of Education: N/A   Occupational History  . formed [employer]     now staying home   Social History Main Topics  . Smoking status: Never Smoker   . Smokeless tobacco: Never Used  . Alcohol Use: Yes     Comment: one pint vodka daily years  . Drug Use: No     Comment: denied all drugs during admission  . Sexual Activity: Not Currently    Birth Control/ Protection: None   Other Topics Concern  . Not on file   Social History Narrative    Review of Systems: Pertinent positive and negative review of systems were noted in the above HPI section.  All other review of systems was otherwise negative.Marland Kitchen  Physical Exam: Vital signs in last 24 hours: Temp:  [97.8 F (36.6 C)-98.6 F (37 C)] 97.8 F (36.6 C) (04/18 0537) Pulse Rate:  [92-118] 92 (04/18 0537) Resp:  [18-20] 20 (04/18 0537) BP: (110-120)/(79-88) 112/82 mmHg (04/18 0537) SpO2:  [96 %-100 %] 100 % (04/18 0537) Weight:  [117 lb (53.071 kg)-120 lb (54.432 kg)] 120 lb (54.432 kg) (04/17 2249)   General:   Alert,  Well-developed, well-nourished,thin ,WF pleasant and cooperative in NAD Head:  Normocephalic and atraumatic. Eyes:  Sclera clear, no icterus.   Conjunctiva pink. Ears:  Normal auditory acuity. Nose:  No deformity, discharge,  or lesions. Mouth:  No deformity or lesions.   Neck:  Supple; no masses or thyromegaly. Lungs:  Clear throughout to auscultation.   No wheezes, crackles, or rhonchi. Heart:  Regular rate and rhythm; no murmurs, clicks, rubs,  or gallops. Abdomen:  Protuberant tender rather diffusely  tense ascites, BS active,nonpalp mass or hsm.   Rectal:  Deferred  Msk:  Symmetrical without gross deformities. . Pulses:  Normal pulses noted. Extremities:  Without clubbing or edema. Neurologic:  Alert and  oriented x4;  grossly normal neurologically. Skin:  Intact without  significant lesions or rashes.. Psych:  Alert and cooperative. Normal mood and affect.  Intake/Output from previous day: 04/17 0701 - 04/18 0700 In: 467.1 [I.V.:417.1; IV Piggyback:50] Out: 600 [Urine:600]    Lab Results:  Recent Labs  08/07/15 1307 08/07/15 2202 08/08/15 0503  WBC 4.6  --  3.3*  HGB 10.7* 10.0* 10.1*  HCT 32.0* 28.6* 29.1*  PLT 143*  --  121*   BMET  Recent Labs  08/07/15 1307 08/08/15 0503  NA 130* 128*  K 3.9 3.3*  CL 95* 96*  CO2 22 25  GLUCOSE 451* 290*  BUN 8 5*  CREATININE 0.55 0.48  CALCIUM 8.9 7.9*   LFT  Recent Labs  08/08/15 0503  PROT 6.8  ALBUMIN 2.7*  AST 45*  ALT 54  ALKPHOS 86  BILITOT 1.3*   PT/INR  Recent Labs  08/07/15 2202  LABPROT 16.8*  INR 1.35    IMPRESSION:  #36 50 year old female alcoholic, who has been abstinent over the past 1 month with history of EtOH/hep C-induced decompensated cirrhosis. Patient admitted last evening with complaints of hematemesis and melena over this past weekend has since resolved. Last EGD August 2015 no varices, she did have 3 nonbleeding gastric ulcers and a Mallory-Weiss tear. Patient has been taking ibuprofen in high doses and suspect she may have recurrent ulcers #2 recent weight gain and progressive abdominal distention/discomfort and subsequent dyspnea-on exam she appears to have more ascites than accounted for on ultrasound report. Suspect her discomfort is all due to abdominal distention but cannot rule out SBP  #3 history of polysubstance abuse-cocaine and narcotics in the past and narcotic dependent at home #4  depression with prior suicide attempts #5 adult-onset diabetes mellitus 6 cholelithiasis  PLAN:  #1 keep nothing by mouth this morning and have scheduled for EGD later today with Dr. Carlean Purl. Procedure discussed in detail with patient and she is agreeable to proceed-doubt variceal but will leave octreotide infusion going until post EGD #2 continue PPI infusion,  may be of her switch to twice a day dosing #3 have scheduled for ultrasound-guided paracentesis-to remove 3-4 L and also send for Gram stain cultures etc. albumin ordered if more than 2 L removed  # 4 We will start Rocephin-given patient's cirrhotic with upper GI bleeding Decrease IV fluids-done We will follow with you   Amy Esterwood  08/08/2015, 9:15 AM     [facility] Attending   I have taken an interval history, reviewed the chart and examined the patient. I agree with the Advanced Practitioner's note, impression and recommendations.   Decompensated cirrhosis - ascites and GI bleeding   EGD today The risks and benefits as well as alternatives of endoscopic procedure(s) have been discussed and reviewed. All questions answered. The patient agrees to proceed.  Gatha Mayer, MD, [facility] [phone] (pager) [phone] after 5 PM, weekends and holidays  08/08/2015 12:50 PM

## 2015-08-08 NOTE — Op Note (Signed)
[facility] Patient Name: [name] Procedure Date: 08/08/2015 MRN: [id number] Attending MD: Gatha Mayer , MD Date of Birth: 1965/07/29 CSN:  Age: 50 Admit Type: Inpatient Procedure:                Upper GI endoscopy Indications:              Melena, Suspected upper gastrointestinal bleeding,                            Cirrhosis with suspected esophageal varices Providers:                Gatha Mayer, MD, Dustin Flock, RN, Cletis Athens,                            Technician Referring MD:              Medicines:                Monitored Anesthesia Care Complications:            No immediate complications. Estimated Blood Loss:     Estimated blood loss: none. Procedure:                Pre-Anesthesia Assessment:                           - Prior to the procedure, a History and Physical                            was performed, and patient medications and                            allergies were reviewed. The patient's tolerance of                            previous anesthesia was also reviewed. The risks                            and benefits of the procedure and the sedation                            options and risks were discussed with the patient.                            All questions were answered, and informed consent                            was obtained. Prior Anticoagulants: The patient                            last took ibuprofen 1 day prior to the procedure.                            ASA Grade Assessment: III - A patient with severe  systemic disease. After reviewing the risks and                            benefits, the patient was deemed in satisfactory                            condition to undergo the procedure.                           After obtaining informed consent, the endoscope was                            passed under direct vision. Throughout the                            procedure, the patient's  blood pressure, pulse, and                            oxygen saturations were monitored continuously. The                            [id number]) scope was introduced through the                            mouth, and advanced to the second part of duodenum.                            The upper GI endoscopy was accomplished without                            difficulty. The patient tolerated the procedure                            well. Scope In: Scope Out: Findings:      Grade I varices were found in the lower third of the esophagus.      Multiple dispersed, small non-bleeding erosions were found in the       gastric antrum. There were stigmata of recent bleeding.      A medium amount of food (residue) was found in the entire examined       stomach.      The exam was otherwise without abnormality.      The cardia and gastric fundus were normal on retroflexion. Impression:               - Grade I esophageal varices.                           - Non-bleeding erosive gastropathy.                           - A medium amount of food (residue) in the stomach.                           - The examination was otherwise normal.                           -  No specimens collected. Moderate Sedation:      Please see anesthesia notes, moderate sedation not given Recommendation:           - Return patient to hospital ward for ongoing care.                           - bid PPI ok to treat NSAID gastritis                           think food retention mostly due to tight ascites                           will f/u tomorrow                           ? trace varices here                           - Resume previous diet.                           - Continue present medications. Procedure Code(s):        --- Professional ---                           [phone], Esophagogastroduodenoscopy, flexible,                            transoral; diagnostic, including collection of                             specimen(s) by brushing or washing, when performed                            (separate procedure) Diagnosis Code(s):        --- Professional ---                           K74.60, Unspecified cirrhosis of liver                           I85.10, Secondary esophageal varices without                            bleeding                           K31.89, Other diseases of stomach and duodenum                           K92.1, Melena (includes Hematochezia) CPT copyright 2016 American Medical Association. All rights reserved. The codes documented in this report are preliminary and upon coder review may  be revised to meet current compliance requirements. Gatha Mayer, MD 08/08/2015 1:12:17 PM This report has been signed electronically. Number of Addenda: 0

## 2015-08-08 NOTE — Progress Notes (Addendum)
[facility] PROGRESS NOTE  [name] [id number] DOB: May 20, 1965 DOA: 08/07/2015 PCP: Cathlean Cower, MD  Assessment/Plan: 1. Upper GI bleed. -[name] having a history of alcohol abuse, cirrhosis, presented with complaints of hematemesis and melena. -She was seen and evaluated by GI undergoing upper endoscopy on 08/08/2015 that revealed grade 1 esophageal varices found in the lower third of the esophagus. She was also found to have multiple nonbleeding erosions in the gastric antrum. GI reporting there was stigmata of recent bleeding. -Suspect gastropathy related to NSAIDS -IV octreotide and protonix infusion discontinued, started on Protonix 40 mg IV twice a day.  2.  Cirrhosis secondary to alcohol abuse/hepatitis C. -EGD performed today showing grade 1 esophageal varices. -She reports having her last drink over a month ago. -GI has schedule her for ultrasound-guided paracentesis  3.  Insulin dependent diabetes mellitus. -Continue Lantus 20 units subcutaneous daily -Blood sugars improved, however last blood sugar of 98, was 383 overnight.  4.  History of alcohol abuse. -She reports having her last drink over a month ago.  5.  Hypokalemia. -Replaced with Kdur -Repeat labs in a.m.  Code Status: Full code Family Communication: Family not present Disposition Plan: Plan for ultrasound-guided thoracentesis   Consultants:  GI  Procedures:  EGD performed on 08/08/2015  Antibiotics:    HPI/Subjective: [name] is a 50 year old female with a past medical history of alcohol abuse, history of cirrhosis, hepatitis C, GI bleed secondary to bleeding ulcers, admitted to the medicine service on 08/07/2015 when she was in with complaints of hematemesis/melena. Initial lab work showing hemoglobin of 10.7, previously 13.9. She was started on IV Protonix and octreotide. She was seen and evaluated by GI, undergoing upper endoscopy on 08/08/2015 that showed grade 1 esophageal  varices, nonbleeding erosive gastropathy.   Objective: Filed Vitals:   08/08/15 1335 08/08/15 1350  BP:  112/75  Pulse: 102 94  Temp:  98.1 F (36.7 C)  Resp: 11 22    Intake/Output Summary (Last 24 hours) at 08/08/15 1439 Last data filed at 08/08/15 1310  Gross per 24 hour  Intake 867.08 ml  Output   1200 ml  Net -332.92 ml   Filed Weights   08/07/15 1651 08/07/15 2249  Weight: 53.071 kg (117 lb) 54.432 kg (120 lb)    Exam:   General:  Nontoxic-appearing  Cardiovascular: Regular rate rhythm normal S1-S2  Respiratory: Normal respiratory effort  Abdomen: She has abdominal distention mild generalized tenderness to palpation  Extremities: No edema to lower extremities  Data Reviewed: Basic Metabolic Panel:  Recent Labs Lab 08/07/15 1307 08/07/15 2202 08/08/15 0503  NA 130*  --  128*  K 3.9  --  3.3*  CL 95*  --  96*  CO2 22  --  25  GLUCOSE 451*  --  290*  BUN 8  --  5*  CREATININE 0.55  --  0.48  CALCIUM 8.9  --  7.9*  MG  --  1.3*  --   PHOS  --  2.6  --    Liver Function Tests:  Recent Labs Lab 08/07/15 1307 08/08/15 0503  AST 45* 45*  ALT 63* 54  ALKPHOS 111 86  BILITOT 1.5* 1.3*  PROT 7.7 6.8  ALBUMIN 3.1* 2.7*    Recent Labs Lab 08/07/15 1307  LIPASE 75*   No results for input(s): AMMONIA in the last 168 hours. CBC:  Recent Labs Lab 08/07/15 1307 08/07/15 2202 08/08/15 0503  WBC 4.6  --  3.3*  NEUTROABS  --   --  1.8  HGB 10.7* 10.0* 10.1*  HCT 32.0* 28.6* 29.1*  MCV 105.6*  --  103.6*  PLT 143*  --  121*   Cardiac Enzymes: No results for input(s): CKTOTAL, CKMB, CKMBINDEX, TROPONINI in the last 168 hours. BNP (last 3 results)  Recent Labs  05/23/15 2139  BNP 21.8    ProBNP (last 3 results) No results for input(s): PROBNP in the last 8760 hours.  CBG:  Recent Labs Lab 08/07/15 2129 08/07/15 2331 08/08/15 0742 08/08/15 1135  GLUCAP 319* 383* 252* 98    No results found for this or any previous visit  (from the past 240 hour(s)).   Studies: US Abdomen Complete  08/07/2015  CLINICAL DATA:  Abdominal pain and distention for 2 days. History of cirrhosis. EXAM: ABDOMEN ULTRASOUND COMPLETE COMPARISON:  Abdominal MRI 05/26/2015, CT 03/28/2015 FINDINGS: Gallbladder: Contracted with diffuse wall thickening wall thickness measures 4.5 mm. Cholelithiasis with largest stone measuring 4 mm. Common bile duct: Diameter: 3 mm at the porta hepatis, distal common bile duct is not well visualized. Liver: No focal lesion identified. Heterogeneous parenchyma with nodular contour. Normal directional flow in the imaged portal venous system. IVC: No abnormality visualized. Pancreas: Not well visualized. Spleen: 11.1 x 14.8 x 6.3 cm for a volume of 538 cc. Right Kidney: Length: 12.1 cm. Echogenicity within normal limits. No mass or hydronephrosis visualized. Left Kidney: Length: 12.6 cm. Echogenicity within normal limits. No mass or hydronephrosis visualized. Abdominal aorta: No aneurysm visualized. Other findings: Small to moderate perihepatic ascites. IMPRESSION: 1. Cirrhosis with splenomegaly. 2. Contracted gallbladder with gallstones and wall thickening. Wall thickening likely secondary to chronic liver disease. 3. Small to moderate perihepatic ascites. Electronically Signed   By: Jeb Levering M.D.   On: 08/07/2015 19:58    Scheduled Meds: . albumin human  50 g Intravenous Once  . insulin aspart  0-15 Units Subcutaneous TID WC  . insulin glargine  20 Units Subcutaneous QHS  . [START ON 08/11/2015] pantoprazole (PROTONIX) IV  40 mg Intravenous Q12H   Continuous Infusions: . sodium chloride 50 mL/hr at 08/08/15 0008  . lactated ringers 1,000 mL (08/08/15 1159)  . octreotide  (SANDOSTATIN)    IV infusion 50 mcg/hr (08/08/15 0920)  . pantoprozole (PROTONIX) infusion 8 mg/hr (08/08/15 0919)    Principal Problem:   UGI bleed Active Problems:   Alcoholic cirrhosis of liver with ascites ([place])   Poorly controlled  type 2 diabetes mellitus ([facility])   Anxiety and depression   GERD (gastroesophageal reflux disease)   Anemia   Acute hepatitis C virus infection without hepatic coma   Erosive gastritis   Secondary esophageal varices without bleeding ([place])    Time spent: 35 min    Kelvin Cellar  [facility] Pager [phone]. If 7PM-7AM, please contact night-coverage at [email or web address], password [email or web address] 08/08/2015, 2:39 PM  LOS: 1 day

## 2015-08-08 NOTE — Transfer of Care (Signed)
Immediate Anesthesia Transfer of Care Note  Patient: [name]  Procedure(s) Performed: Procedure(s): ESOPHAGOGASTRODUODENOSCOPY (EGD) WITH PROPOFOL (N/A)  Patient Location: PACU and Endoscopy Unit  Anesthesia Type:MAC  Level of Consciousness: awake, oriented, patient cooperative, lethargic and responds to stimulation  Airway & Oxygen Therapy: Patient Spontanous Breathing and Patient connected to nasal cannula oxygen  Post-op Assessment: Report given to RN, Post -op Vital signs reviewed and stable and Patient moving all extremities  Post vital signs: Reviewed and stable  Last Vitals:  Filed Vitals:   08/08/15 0537 08/08/15 1159  BP: 112/82 101/85  Pulse: 92 102  Temp: 36.6 C   Resp: 20 10    Complications: No apparent anesthesia complications

## 2015-08-08 NOTE — Anesthesia Postprocedure Evaluation (Signed)
Anesthesia Post Note  Patient: [name]  Procedure(s) Performed: Procedure(s) (LRB): ESOPHAGOGASTRODUODENOSCOPY (EGD) WITH PROPOFOL (N/A)  Patient location during evaluation: PACU Anesthesia Type: MAC Level of consciousness: awake and alert Pain management: pain level controlled Vital Signs Assessment: post-procedure vital signs reviewed and stable Respiratory status: spontaneous breathing, nonlabored ventilation, respiratory function stable and patient connected to nasal cannula oxygen Cardiovascular status: stable and blood pressure returned to baseline Anesthetic complications: no    Last Vitals:  Filed Vitals:   08/08/15 1335 08/08/15 1350  BP:  112/75  Pulse: 102 94  Temp:  36.7 C  Resp: 11 22    Last Pain:  Filed Vitals:   08/08/15 1355  PainSc: 9                  Jonesha Tsuchiya J

## 2015-08-08 NOTE — Anesthesia Preprocedure Evaluation (Addendum)
Anesthesia Evaluation  Patient identified by MRN, date of birth, ID band Patient awake    Reviewed: Allergy & Precautions, NPO status , Patient's Chart, lab work & pertinent test results  Airway Mallampati: II  TM Distance: >3 FB Neck ROM: Full    Dental no notable dental hx.    Pulmonary neg pulmonary ROS,    Pulmonary exam normal breath sounds clear to auscultation       Cardiovascular negative cardio ROS Normal cardiovascular exam Rhythm:Regular Rate:Normal     Neuro/Psych PSYCHIATRIC DISORDERS Anxiety Depression negative neurological ROS     GI/Hepatic GERD  ,(+) Cirrhosis     substance abuse  alcohol use and cocaine use, Hepatitis -, C  Endo/Other  diabetes, Type 2, Insulin Dependent, Oral Hypoglycemic Agents  Renal/GU negative Renal ROS  negative genitourinary   Musculoskeletal negative musculoskeletal ROS (+)   Abdominal   Peds negative pediatric ROS (+)  Hematology  (+) anemia , Thrombocytopenia   Anesthesia Other Findings   Reproductive/Obstetrics negative OB ROS                             Anesthesia Physical Anesthesia Plan  ASA: III  Anesthesia Plan: MAC   Post-op Pain Management:    Induction: Intravenous  Airway Management Planned: Natural Airway  Additional Equipment:   Intra-op Plan:   Post-operative Plan:   Informed Consent: I have reviewed the patients History and Physical, chart, labs and discussed the procedure including the risks, benefits and alternatives for the proposed anesthesia with the patient or authorized representative who has indicated his/her understanding and acceptance.   Dental advisory given  Plan Discussed with: CRNA  Anesthesia Plan Comments:         Anesthesia Quick Evaluation

## 2015-08-09 ENCOUNTER — Encounter (HOSPITAL_COMMUNITY): Payer: Self-pay | Admitting: Internal Medicine

## 2015-08-09 DIAGNOSIS — E876 Hypokalemia: Secondary | ICD-10-CM

## 2015-08-09 DIAGNOSIS — E1165 Type 2 diabetes mellitus with hyperglycemia: Secondary | ICD-10-CM

## 2015-08-09 LAB — BASIC METABOLIC PANEL
Anion gap: 8 (ref 5–15)
BUN: 6 mg/dL (ref 6–20)
CALCIUM: 8 mg/dL — AB (ref 8.9–10.3)
CHLORIDE: 97 mmol/L — AB (ref 101–111)
CO2: 24 mmol/L (ref 22–32)
CREATININE: 0.56 mg/dL (ref 0.44–1.00)
GFR calc Af Amer: >60 mL/min (ref >60)
GFR calc non Af Amer: >60 mL/min (ref >60)
GLUCOSE: 353 mg/dL — AB (ref 65–99)
Potassium: 4.4 mmol/L (ref 3.5–5.1)
Sodium: 129 mmol/L — ABNORMAL LOW (ref 135–145)

## 2015-08-09 LAB — CBC
HCT: 29.4 % — ABNORMAL LOW (ref 36.0–46.0)
Hemoglobin: 9.9 g/dL — ABNORMAL LOW (ref 12.0–15.0)
MCH: 35.5 pg — AB (ref 26.0–34.0)
MCHC: 33.7 g/dL (ref 30.0–36.0)
MCV: 105.4 fL — AB (ref 78.0–100.0)
PLATELETS: 108 10*3/uL — AB (ref 150–400)
RBC: 2.79 MIL/uL — AB (ref 3.87–5.11)
RDW: 15.7 % — AB (ref 11.5–15.5)
WBC: 2.8 10*3/uL — ABNORMAL LOW (ref 4.0–10.5)

## 2015-08-09 LAB — GLUCOSE, CAPILLARY
GLUCOSE-CAPILLARY: 275 mg/dL — AB (ref 65–99)
GLUCOSE-CAPILLARY: 183 mg/dL — AB (ref 65–99)
Glucose-Capillary: 303 mg/dL — ABNORMAL HIGH (ref 65–99)

## 2015-08-09 MED ORDER — FUROSEMIDE 40 MG PO TABS
40.0000 mg | ORAL_TABLET | Freq: Two times a day (BID) | ORAL | Status: DC
  Administered 2015-08-09 – 2015-08-10 (×4): 40 mg via ORAL
  Filled 2015-08-09 (×4): qty 1

## 2015-08-09 MED ORDER — ATENOLOL 12.5 MG HALF TABLET
12.5000 mg | ORAL_TABLET | Freq: Every day | ORAL | Status: DC
  Administered 2015-08-09 – 2015-08-10 (×2): 12.5 mg via ORAL
  Filled 2015-08-09 (×2): qty 1

## 2015-08-09 MED ORDER — SPIRONOLACTONE 100 MG PO TABS
100.0000 mg | ORAL_TABLET | Freq: Two times a day (BID) | ORAL | Status: DC
  Administered 2015-08-09 – 2015-08-10 (×4): 100 mg via ORAL
  Filled 2015-08-09 (×4): qty 1

## 2015-08-09 MED ORDER — LACTULOSE 10 GM/15ML PO SOLN
20.0000 g | Freq: Three times a day (TID) | ORAL | Status: DC
  Administered 2015-08-09 – 2015-08-10 (×5): 20 g via ORAL
  Filled 2015-08-09 (×6): qty 30

## 2015-08-09 MED ORDER — ALBUMIN HUMAN 25 % IV SOLN
25.0000 g | Freq: Once | INTRAVENOUS | Status: AC
  Administered 2015-08-09: 25 g via INTRAVENOUS
  Filled 2015-08-09: qty 100

## 2015-08-09 MED ORDER — RIFAXIMIN 550 MG PO TABS
550.0000 mg | ORAL_TABLET | Freq: Two times a day (BID) | ORAL | Status: DC
  Administered 2015-08-09 – 2015-08-10 (×3): 550 mg via ORAL
  Filled 2015-08-09 (×4): qty 1

## 2015-08-09 NOTE — Progress Notes (Signed)
     [facility] Progress Note  Subjective:  EGD yesterday, 4/19, with grade 1 varices, non-bleeding erosive gastropathy, and residual food in her stomach.  Hgb stable this AM.  Had a BM this AM, no bleeding noted.  Tolerated breakfast.  Feeling a little better but still complaining of abdominal discomfort and fullness.  Objective:  Vital signs in last 24 hours: Temp:  [98.1 F (36.7 C)] 98.1 F (36.7 C) (04/19 0450) Pulse Rate:  [84-103] 84 (04/19 0450) Resp:  [7-22] 18 (04/19 0450) BP: (101-123)/(69-92) 115/79 mmHg (04/19 0450) SpO2:  [94 %-100 %] 100 % (04/19 0450) Last BM Date: 08/07/15 General:  Alert, Well-developed, in NAD Heart:  Regular rate and rhythm; no murmurs Pulm:  CTAB.  No W/R/R. Abdomen:  Still distended with ascites fluid.  BS present.  Diffuse TTP. Neurologic:  Alert and oriented x 4;  grossly normal neurologically. Psych:  Alert and cooperative. Normal mood and affect.  Intake/Output from previous day: 04/18 0701 - 04/19 0700 In: 77 [P.O.:320; I.V.:400] Out: 1050 [Urine:1050]  Lab Results:  Recent Labs  08/07/15 1307 08/07/15 2202 08/08/15 0503 08/09/15 0459  WBC 4.6  --  3.3* 2.8*  HGB 10.7* 10.0* 10.1* 9.9*  HCT 32.0* 28.6* 29.1* 29.4*  PLT 143*  --  121* 108*   BMET  Recent Labs  08/07/15 1307 08/08/15 0503 08/09/15 0459  NA 130* 128* 129*  K 3.9 3.3* 4.4  CL 95* 96* 97*  CO2 22 25 24   GLUCOSE 451* 290* 353*  BUN 8 5* 6  CREATININE 0.55 0.48 0.56  CALCIUM 8.9 7.9* 8.0*    Assessment / Plan: #49 49 year old female alcoholic, who has been abstinent over the past 1 month with history of EtOH/hep C-induced decompensated cirrhosis. Patient admitted with complaints of hematemesis and melena.  EGD 4/18 showed grade 1 varices, erosive gastropathy, and retained food in her stomach.  Hgb stable. #2 recent weight gain and progressive abdominal distention/discomfort and subsequent dyspnea.  S/p 1.8 Liters removed by paracentesis  4/18, cell count negative for SBP. 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  Also discussed in detail a 2 gram sodium diet. -Restart xifaxan 550 mg BID and lactulose TID. -Continue PPI BID.   LOS: 2 days   ZEHR, JESSICA D.  08/09/2015, 8:43 AM  Pager number [phone]   Agree with Ms. Alphia Kava management. Will see if she can tolerate spironolactone with hyponatremia though some of it can be attributed to hyperglycemia causing pseudohyponatremia. She will need f/u me in office after dc There should be room to work her in to held spots in early May (2 or 4)   Gatha Mayer, MD, [facility]

## 2015-08-09 NOTE — Progress Notes (Signed)
[facility] PROGRESS NOTE  [name] [id number] DOB: 1965-04-29 DOA: 08/07/2015 PCP: Meredith Cower, MD  Assessment/Plan: 1. Upper GI bleed. -[name] having a history of alcohol abuse, cirrhosis, presented with complaints of hematemesis and melena. -She was seen and evaluated by GI undergone upper endoscopy on 08/08/2015 that revealed grade 1 esophageal varices in the lower third of the esophagus. She was also found to have multiple nonbleeding erosions in the gastric antrum. GI reporting there was stigmata of recent bleeding. -started on PPI BID and diet advanced -no heparin products and encourage to avoid NSAID's -Hgb level is stable  2.  Cirrhosis secondary to alcohol abuse/hepatitis C; with positive ascites . -EGD performed on 4/18 and showed grade 1 esophageal varices. -She reports having her last drink over a month ago. -s/P paracentesis on 4/18 as well (1.8 L removed; no signs of SBP) -feeling better  -will have her lasix, aldactone and lactulose resumed -also low dose b-blocker and rifaximin   3.  Insulin dependent diabetes mellitus. -Continue Lantus 20 units subcutaneous daily -Blood sugars improved; even still fluctuating -will monitor and adjust hypoglycemic regimen as needed   4.  History of alcohol abuse. -She reports having her last drink over a month ago. -no signs of withdrawal -encourage to keep herself in abstinence   5.  Hypokalemia and hypomagnesemia  -electrolytes repleted  -Repeat labs in a.m.  Code Status: Full code Family Communication: Family not present at bedside; case discussed in details with patient. Disposition Plan: Plan for resumption of aldactone, lasix, lactulose and rifaximin; will follow clinical response. Most likely home in am   Consultants:  GI  Procedures:  EGD performed on 08/08/2015: endoscopy on 08/08/2015 that showed grade 1 esophageal varices, nonbleeding erosive gastropathy.   Antibiotics:  None    HPI/Subjective: Afebrile, no CP, no SOB and with significant improvement in her abd distension/pain. No signs of acute overt bleeding.  Objective: Filed Vitals:   08/09/15 0450 08/09/15 1345  BP: 115/79 107/73  Pulse: 84 117  Temp: 98.1 F (36.7 C) 98 F (36.7 C)  Resp: 18 16    Intake/Output Summary (Last 24 hours) at 08/09/15 1722 Last data filed at 08/09/15 1500  Gross per 24 hour  Intake    340 ml  Output    200 ml  Net    140 ml   Filed Weights   08/07/15 1651 08/07/15 2249  Weight: 53.071 kg (117 lb) 54.432 kg (120 lb)    Exam:   General:  Nontoxic-appearing, denies nausea and vomiting; no CP and complaining of mild abd discomfort/distension; but much improved after paracentesis done on 4/18  Cardiovascular: Regular rate rhythm normal S1-S2  Respiratory: Normal respiratory effort  Abdomen: She has mild abdominal distention and mild generalized tenderness to palpation; positive BS  Extremities: No edema to lower extremities or cyanosis   Data Reviewed: Basic Metabolic Panel:  Recent Labs Lab 08/07/15 1307 08/07/15 2202 08/08/15 0503 08/09/15 0459  NA 130*  --  128* 129*  K 3.9  --  3.3* 4.4  CL 95*  --  96* 97*  CO2 22  --  25 24  GLUCOSE 451*  --  290* 353*  BUN 8  --  5* 6  CREATININE 0.55  --  0.48 0.56  CALCIUM 8.9  --  7.9* 8.0*  MG  --  1.3*  --   --   PHOS  --  2.6  --   --    Liver Function Tests:  Recent Labs Lab 08/07/15 1307 08/08/15 0503  AST 45* 45*  ALT 63* 54  ALKPHOS 111 86  BILITOT 1.5* 1.3*  PROT 7.7 6.8  ALBUMIN 3.1* 2.7*    Recent Labs Lab 08/07/15 1307  LIPASE 75*   CBC:  Recent Labs Lab 08/07/15 1307 08/07/15 2202 08/08/15 0503 08/09/15 0459  WBC 4.6  --  3.3* 2.8*  NEUTROABS  --   --  1.8  --   HGB 10.7* 10.0* 10.1* 9.9*  HCT 32.0* 28.6* 29.1* 29.4*  MCV 105.6*  --  103.6* 105.4*  PLT 143*  --  121* 108*   BNP (last 3 results)  Recent Labs  05/23/15 2139  BNP 21.8   CBG:  Recent  Labs Lab 08/08/15 1703 08/08/15 2016 08/09/15 0738 08/09/15 1155 08/09/15 1658  GLUCAP 133* 283* 303* 275* 183*    Recent Results (from the past 240 hour(s))  Culture, body fluid-bottle     Status: None (Preliminary result)   Collection Time: 08/08/15  4:17 PM  Result Value Ref Range Status   Specimen Description FLUID ASCITIC  Final   Special Requests BOTTLES DRAWN AEROBIC AND ANAEROBIC 10CC  Final   Culture   Final    NO GROWTH < 24 HOURS Performed at [facility]    Report Status PENDING  Incomplete  Gram stain     Status: None   Collection Time: 08/08/15  4:17 PM  Result Value Ref Range Status   Specimen Description FLUID ASCITIC  Final   Special Requests NONE  Final   Gram Stain   Final    CYTOSPIN SMEAR WBC PRESENT,BOTH PMN AND MONONUCLEAR NO ORGANISMS SEEN Performed at [facility]    Report Status 08/08/2015 FINAL  Final     Studies: US Abdomen Complete  08/07/2015  CLINICAL DATA:  Abdominal pain and distention for 2 days. History of cirrhosis. EXAM: ABDOMEN ULTRASOUND COMPLETE COMPARISON:  Abdominal MRI 05/26/2015, CT 03/28/2015 FINDINGS: Gallbladder: Contracted with diffuse wall thickening wall thickness measures 4.5 mm. Cholelithiasis with largest stone measuring 4 mm. Common bile duct: Diameter: 3 mm at the porta hepatis, distal common bile duct is not well visualized. Liver: No focal lesion identified. Heterogeneous parenchyma with nodular contour. Normal directional flow in the imaged portal venous system. IVC: No abnormality visualized. Pancreas: Not well visualized. Spleen: 11.1 x 14.8 x 6.3 cm for a volume of 538 cc. Right Kidney: Length: 12.1 cm. Echogenicity within normal limits. No mass or hydronephrosis visualized. Left Kidney: Length: 12.6 cm. Echogenicity within normal limits. No mass or hydronephrosis visualized. Abdominal aorta: No aneurysm visualized. Other findings: Small to moderate perihepatic ascites. IMPRESSION: 1. Cirrhosis with  splenomegaly. 2. Contracted gallbladder with gallstones and wall thickening. Wall thickening likely secondary to chronic liver disease. 3. Small to moderate perihepatic ascites. Electronically Signed   By: Jeb Levering M.D.   On: 08/07/2015 19:58   US Paracentesis  08/08/2015  CLINICAL DATA:  Hepatitis-C, cirrhosis, recurrent ascites, abdominal distension EXAM: ULTRASOUND GUIDED PARACENTESIS TECHNIQUE: The procedure, risks (including but not limited to bleeding, infection, organ damage ), benefits, and alternatives were explained to the patient. Questions regarding the procedure were encouraged and answered. The patient understands and consents to the procedure. Survey ultrasound of the abdomen was performed and an appropriate skin entry site in the right lateral abdomen was selected. Skin site was marked, prepped with chlorhexidine, and draped in usual sterile fashion, and infiltrated locally with 1% lidocaine. A Safe-T-Centesis sheath needle was advanced into the peritoneal  space until fluid could be aspirated. The sheath was advanced and the needle removed. 1.8 L of clear yellowascites were aspirated. Samples of the aspirate were sent for the requested laboratory studies. COMPLICATIONS: COMPLICATIONS none IMPRESSION: Technically successful ultrasound guided paracentesis, removing 1.8 L of ascites. Electronically Signed   By: Lucrezia Europe M.D.   On: 08/08/2015 16:59    Scheduled Meds: . albumin human  25 g Intravenous Once  . atenolol  12.5 mg Oral Daily  . furosemide  40 mg Oral BID  . insulin aspart  0-15 Units Subcutaneous TID WC  . insulin glargine  20 Units Subcutaneous QHS  . lactulose  20 g Oral TID  . [START ON 08/11/2015] pantoprazole (PROTONIX) IV  40 mg Intravenous Q12H  . rifaximin  550 mg Oral BID  . spironolactone  100 mg Oral BID   Continuous Infusions: . lactated ringers 1,000 mL (08/08/15 1159)    Principal Problem:   UGI bleed Active Problems:   Alcoholic cirrhosis of  liver with ascites ([place])   Poorly controlled type 2 diabetes mellitus ([place])   Anxiety and depression   GERD (gastroesophageal reflux disease)   Anemia   Acute hepatitis C virus infection without hepatic coma   Erosive gastritis   Secondary esophageal varices without bleeding ([place])    Time spent: 30 min    Barton Dubois  [facility] Pager [phone]. If 7PM-7AM, please contact night-coverage at [email or web address], password [facility] 08/09/2015, 5:22 PM  LOS: 2 days

## 2015-08-09 NOTE — Plan of Care (Signed)
Problem: Education: Goal: Knowledge of [facility] Education information/materials will improve Outcome: Completed/Met Date Met:  08/09/15 Education provided regarding current diagnosis and hx related risk factors, treatments.

## 2015-08-10 DIAGNOSIS — F418 Other specified anxiety disorders: Secondary | ICD-10-CM

## 2015-08-10 DIAGNOSIS — E871 Hypo-osmolality and hyponatremia: Secondary | ICD-10-CM | POA: Insufficient documentation

## 2015-08-10 DIAGNOSIS — D649 Anemia, unspecified: Secondary | ICD-10-CM

## 2015-08-10 DIAGNOSIS — K219 Gastro-esophageal reflux disease without esophagitis: Secondary | ICD-10-CM

## 2015-08-10 DIAGNOSIS — E43 Unspecified severe protein-calorie malnutrition: Secondary | ICD-10-CM

## 2015-08-10 DIAGNOSIS — D696 Thrombocytopenia, unspecified: Secondary | ICD-10-CM

## 2015-08-10 DIAGNOSIS — R740 Nonspecific elevation of levels of transaminase and lactic acid dehydrogenase [LDH]: Secondary | ICD-10-CM

## 2015-08-10 LAB — GLUCOSE, CAPILLARY
GLUCOSE-CAPILLARY: 325 mg/dL — AB (ref 65–99)
Glucose-Capillary: 285 mg/dL — ABNORMAL HIGH (ref 65–99)
Glucose-Capillary: 312 mg/dL — ABNORMAL HIGH (ref 65–99)
Glucose-Capillary: 283 mg/dL — ABNORMAL HIGH (ref 65–99)

## 2015-08-10 LAB — BASIC METABOLIC PANEL
Anion gap: 9 (ref 5–15)
BUN: 6 mg/dL (ref 6–20)
CO2: 28 mmol/L (ref 22–32)
Calcium: 8.5 mg/dL — ABNORMAL LOW (ref 8.9–10.3)
Chloride: 96 mmol/L — ABNORMAL LOW (ref 101–111)
Creatinine, Ser: 0.59 mg/dL (ref 0.44–1.00)
GFR calc Af Amer: >60 mL/min (ref >60)
GLUCOSE: 384 mg/dL — AB (ref 65–99)
POTASSIUM: 3.9 mmol/L (ref 3.5–5.1)
Sodium: 133 mmol/L — ABNORMAL LOW (ref 135–145)

## 2015-08-10 LAB — PATHOLOGIST SMEAR REVIEW

## 2015-08-10 LAB — MAGNESIUM: Magnesium: 1.5 mg/dL — ABNORMAL LOW (ref 1.7–2.4)

## 2015-08-10 MED ORDER — DIPHENHYDRAMINE HCL 25 MG PO TABS: 100.0000 mg | ORAL_TABLET | Freq: Every evening | ORAL | Status: DC | PRN

## 2015-08-10 MED ORDER — FUROSEMIDE 40 MG PO TABS: 40.0000 mg | ORAL_TABLET | Freq: Two times a day (BID) | ORAL | Status: DC

## 2015-08-10 MED ORDER — OXYCODONE HCL 5 MG PO TABS: 5.0000 mg | ORAL_TABLET | Freq: Four times a day (QID) | ORAL | Status: DC | PRN

## 2015-08-10 MED ORDER — OXYCODONE HCL 5 MG PO TABS
5.0000 mg | ORAL_TABLET | Freq: Four times a day (QID) | ORAL | Status: DC | PRN
  Administered 2015-08-10: 5 mg via ORAL
  Filled 2015-08-10: qty 1

## 2015-08-10 MED ORDER — SPIRONOLACTONE 100 MG PO TABS: 100.0000 mg | ORAL_TABLET | Freq: Two times a day (BID) | ORAL | Status: DC

## 2015-08-10 MED ORDER — ATENOLOL 25 MG PO TABS: 12.5000 mg | ORAL_TABLET | Freq: Every day | ORAL | Status: DC

## 2015-08-10 MED ORDER — NYSTATIN 100000 UNIT/GM EX CREA: TOPICAL_CREAM | Freq: Three times a day (TID) | CUTANEOUS | Status: DC

## 2015-08-10 MED ORDER — INSULIN PEN NEEDLE 32G X 4 MM MISC: Status: DC

## 2015-08-10 MED ORDER — KLOR-CON M10 10 MEQ PO TBCR: 10.0000 meq | EXTENDED_RELEASE_TABLET | Freq: Every day | ORAL | Status: DC

## 2015-08-10 MED ORDER — PANTOPRAZOLE SODIUM 40 MG PO TBEC: 40.0000 mg | DELAYED_RELEASE_TABLET | Freq: Two times a day (BID) | ORAL | Status: AC

## 2015-08-10 MED ORDER — NYSTATIN 100000 UNIT/GM EX CREA
TOPICAL_CREAM | Freq: Three times a day (TID) | CUTANEOUS | Status: DC
  Filled 2015-08-10: qty 15

## 2015-08-10 NOTE — Care Management Note (Signed)
Case Management Note  Patient Details  Name: [name] MRN: [id number] Date of Birth: 04-17-1966  Subjective/Objective:                    Action/Plan:d/c home   Expected Discharge Date:   (unknown)               Expected Discharge Plan:  Home/Self Care  In-House Referral:     Discharge planning Services  CM Consult  Post Acute Care Choice:    Choice offered to:     DME Arranged:    DME Agency:     HH Arranged:    [place] Agency:     Status of Service:  Completed, signed off  Medicare Important Message Given:    Date Medicare IM Given:    Medicare IM give by:    Date Additional Medicare IM Given:    Additional Medicare Important Message give by:     If discussed at [place] of Stay Meetings, dates discussed:    Additional Comments:  Dessa Phi, RN 08/10/2015, 2:25 PM

## 2015-08-10 NOTE — Progress Notes (Signed)
[facility] Progress Note  Subjective:  Feels much better than when she came in.  Would like to go home.  Still having some upper abdominal pain, but tolerating diet, etc.  Moving her bowels; no black stools.  Objective:  Vital signs in last 24 hours: Temp:  [98 F (36.7 C)-98.1 F (36.7 C)] 98.1 F (36.7 C) (04/20 0550) Pulse Rate:  [100-124] 100 (04/20 0550) Resp:  [16] 16 (04/20 0550) BP: (96-111)/(61-75) 96/61 mmHg (04/20 0550) SpO2:  [97 %-100 %] 97 % (04/20 0550) Last BM Date: 08/09/15 General:  Alert, Well-developed, in NAD Heart:  Regular rate and rhythm; no murmurs Pulm:  CTAB.  No W/R/R. Abdomen:  Soft, distended from ascites fluid.  BS present.  Some upper abdominal TTP without R/R/G. Extremities:  Without edema. Neurologic:  Alert and oriented x 4;  grossly normal neurologically. Psych:  Alert and cooperative. Normal mood and affect.  Intake/Output from previous day: 04/19 0701 - 04/20 0700 In: 240 [P.O.:240] Out: -   Lab Results:  Recent Labs  08/07/15 1307 08/07/15 2202 08/08/15 0503 08/09/15 0459  WBC 4.6  --  3.3* 2.8*  HGB 10.7* 10.0* 10.1* 9.9*  HCT 32.0* 28.6* 29.1* 29.4*  PLT 143*  --  121* 108*   BMET  Recent Labs  08/08/15 0503 08/09/15 0459 08/10/15 0503  NA 128* 129* 133*  K 3.3* 4.4 3.9  CL 96* 97* 96*  CO2 25 24 28   GLUCOSE 290* 353* 384*  BUN 5* 6 6  CREATININE 0.48 0.56 0.59  CALCIUM 7.9* 8.0* 8.5*   LFT  Recent Labs  08/08/15 0503  PROT 6.8  ALBUMIN 2.7*  AST 45*  ALT 54  ALKPHOS 86  BILITOT 1.3*   PT/INR  Recent Labs  08/07/15 2202  LABPROT 16.8*  INR 1.35   US Paracentesis  08/08/2015  CLINICAL DATA:  Hepatitis-C, cirrhosis, recurrent ascites, abdominal distension EXAM: ULTRASOUND GUIDED PARACENTESIS TECHNIQUE: The procedure, risks (including but not limited to bleeding, infection, organ damage ), benefits, and alternatives were explained to the patient. Questions regarding the procedure  were encouraged and answered. The patient understands and consents to the procedure. Survey ultrasound of the abdomen was performed and an appropriate skin entry site in the right lateral abdomen was selected. Skin site was marked, prepped with chlorhexidine, and draped in usual sterile fashion, and infiltrated locally with 1% lidocaine. A Safe-T-Centesis sheath needle was advanced into the peritoneal space until fluid could be aspirated. The sheath was advanced and the needle removed. 1.8 L of clear yellowascites were aspirated. Samples of the aspirate were sent for the requested laboratory studies. COMPLICATIONS: COMPLICATIONS none IMPRESSION: Technically successful ultrasound guided paracentesis, removing 1.8 L of ascites. Electronically Signed   By: Lucrezia Europe M.D.   On: 08/08/2015 16:59   Assessment / Plan: #62 50 year old female alcoholic, who has been abstinent over the past 1 month with history of EtOH/hep C-induced decompensated cirrhosis. Patient admitted with complaints of hematemesis and melena. EGD 4/18 showed grade 1 varices, erosive gastropathy, and retained food in her stomach. Hgb stable. #2 recent weight gain and progressive abdominal distention/discomfort and subsequent dyspnea. S/p 1.8 Liters removed by paracentesis 4/18, cell count negative for SBP. 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 Also discussed in detail  a 2 gram sodium diet. -Continue xifaxan 550 mg BID and lactulose TID. -Continue PPI BID.  *Ok for discharge from GI standpoint.  Has follow-up with Dr. Carlean Purl in 2 weeks (listed in D/C information).    LOS: 3 days   ZEHR, JESSICA D.  08/10/2015, 8:49 AM  Pager number [place] Attending   I have taken an interval  history, reviewed the chart and examined the patient. I agree with the Advanced Practitioner's note, impression and recommendations.    Gatha Mayer, MD, [facility] [phone] (pager) [phone] after 5 PM, weekends and holidays  08/10/2015 10:05 PM

## 2015-08-10 NOTE — Progress Notes (Signed)
Inpatient Diabetes Program Recommendations  AACE/ADA: New Consensus Statement on Inpatient Glycemic Control (2015)  Target Ranges:  Prepandial:   less than 140 mg/dL      Peak postprandial:   less than 180 mg/dL (1-2 hours)      Critically ill patients:  140 - 180 mg/dL   Review of Glycemic Control  Results for RODINA, SANMARTIN (MRN [id number]) as of 08/10/2015 09:55  Ref. Range 08/09/2015 07:38 08/09/2015 11:55 08/09/2015 16:58 08/09/2015 21:35 08/10/2015 07:39  Glucose-Capillary Latest Ref Range: 65-99 mg/dL 303 (H) 275 (H) 183 (H) 285 (H) 312 (H)    Inpatient Diabetes Program Recommendations:    Increase Lantus to 25 units  Add HS correction. Bedtime CBGs high. Add Novolog 2 units tidwc for meal coverage insulin. Add CHO mod med to 2 gram sodium diet. Need updated HgbA1C.  Will continue to follow. Thank you. Lorenda Peck, RD, LDN, CDE Inpatient Diabetes Coordinator [phone]

## 2015-08-10 NOTE — Discharge Summary (Signed)
Physician Discharge Summary  [name] [id number] DOB: 1966-04-19 DOA: 08/07/2015  PCP: Meredith Cower, MD  Admit date: 08/07/2015 Discharge date: 08/10/2015  Time spent: 35 minutes  Recommendations for Outpatient Follow-up:  1. Repeat CBC to follow Hgb trend 2. Repeat BMET to follow electrolytes and renal funciton 3. Close follow up of her CBG's, A1C and insulin needs; adhust hypoglycemic regimen as needed   Discharge Diagnoses:  Principal Problem:   UGI bleed Active Problems:   Alcoholic cirrhosis of liver with ascites ([place])   Poorly controlled type 2 diabetes mellitus (HCC)   Anxiety and depression   GERD (gastroesophageal reflux disease)   Anemia   Acute hepatitis C virus infection without hepatic coma   Erosive gastritis   Secondary esophageal varices without bleeding ([place])   Discharge Condition: stable and improved. Discharge home with instructions to follow up with PCP in 10 days and with GI service on 08/24/15 as instructed/scheduled for her.  Diet recommendation: low sodium and modified carbohydrates diet   Filed Weights   08/07/15 1651 08/07/15 2249  Weight: 53.071 kg (117 lb) 54.432 kg (120 lb)    History of present illness:  As per H&P written by Dr. Olevia [name] 50 y.o. female with a past medical history of alcoholic liver cirrhosis, hep C, pancreatitis, GERD, gallstones, anxiety and depression, hemochromatosis, pancytopenia, type 2 diabetes coming to the emergency department with dyspnea and worsening ascites/abdominal distention for about a week.  The patient also reports an episode of hematemesis over the weekend, followed by one episode of melena, without further bowel movements, and worsening of burning abdominal pain since then. Her hemoglobin dropped from 13.9 g/dL on 06/10/2015 to 10.7 g/dL on 08/07/2015.   Hospital Course:  1.  Upper GI bleed. -[name] having a history of alcohol abuse, cirrhosis, presented with complaints of hematemesis and melena. -She  was seen and evaluated by GI, undergone upper endoscopy on 08/08/2015 that revealed grade 1 esophageal varices in the lower third of the esophagus. She was also found to have multiple nonbleeding erosions in the gastric antrum. GI reporting there was stigmata of recent bleeding. -started on PPI BID and advise to stop use of NSAIDs -Hgb level remains stable and no transfusion was required. -CB during follow up visit recommended to assess Hgb trend   2. Cirrhosis secondary to alcohol abuse/hepatitis C; with positive ascites . -EGD performed on 4/18 and showed grade 1 esophageal varices. -She reports having her last drink over a month ago. -S/P paracentesis on 4/18 as well (1.8 L removed; no signs of SBP) -feeling much better and with significant improvement in abd distension at discharge  -will have her lasix, aldactone and lactulose resumed -also low dose b-blocker and rifaximin has been prescribed   3. Insulin dependent diabetes mellitus. -Continue Lantus and SSI with novolog at home -will also resume her metformin -close follow up as an outpatient needed for further adjustments -advise to follow modified carbohydrates diet   4. History of alcohol abuse. -She reports having her last drink over a month ago. -no signs of withdrawal -encourage to keep herself in complete abstinence   5. Protein calorie malnutrition: severe and chronic -patient with hypoalbuminemia  -encourage to follow appropriate nutrition and use feeding supplements as instructed   Procedures:  EGD: 08/08/15; that showed grade 1 esophageal varices, nonbleeding erosive gastropathy.  Paracentesis: 08/08/15; yield 1.8 L of ascitic fluids; no SBP by [name] count   Consultations:  Gastroenterology service (Dr. Carlean [name])  Discharge Exam: Filed Vitals:  08/10/15 0550 08/10/15 1335  BP: 96/61 106/74  Pulse: 100 98  Temp: 98.1 F (36.7 C) 98.1 F (36.7 C)  Resp: 16 16    General: Nontoxic-appearing, denies  nausea and vomiting; no CP and complaining of just mild abd discomfort/distension; but much improved after paracentesis done on 4/18  Cardiovascular: Regular rate rhythm normal S1-S2  Respiratory: Normal respiratory effort  Abdomen: She has mild abdominal distention and mild generalized tenderness to palpation; positive BS  Extremities: No edema to lower extremities or cyanosis  Discharge Instructions   Discharge Instructions    Diet - low sodium heart healthy    Complete by:  As directed      Discharge instructions    Complete by:  As directed   Keep yourself in complete alcohol abstinence  Take medications as prescribed Please follow up with PCP in 10 days and with gastroenterologist as instructed Follow a low sodium diet (no more than 2 gram daily) Avoid the use of NSAID's Maintain adequate hydration          Current Discharge Medication List    START taking these medications   Details  atenolol (TENORMIN) 25 MG tablet Take 0.5 tablets (12.5 mg total) by mouth daily. Qty: 30 tablet, Refills: 1    nystatin cream (MYCOSTATIN) Apply topically 3 (three) times daily. Apply to groin area and inner thighs Qty: 30 g, Refills: 0    pantoprazole (PROTONIX) 40 MG tablet Take 1 tablet (40 mg total) by mouth 2 (two) times daily. Qty: 60 tablet, Refills: 1      CONTINUE these medications which have CHANGED   Details  diphenhydrAMINE (BENADRYL) 25 MG tablet Take 4 tablets (100 mg total) by mouth at bedtime as needed for sleep.    furosemide (LASIX) 40 MG tablet Take 1 tablet (40 mg total) by mouth 2 (two) times daily. Qty: 60 tablet, Refills: 1    Insulin Pen Needle (BD PEN NEEDLE NANO U/F) 32G X 4 MM MISC Use as needed to inject insulin as prescribed Qty: 300 each, Refills: 1    KLOR-CON M10 10 MEQ tablet Take 1 tablet (10 mEq total) by mouth daily. Qty: 30 tablet, Refills: 1    oxyCODONE (OXY IR/ROXICODONE) 5 MG immediate release tablet Take 1 tablet (5 mg total) by mouth  every 6 (six) hours as needed for severe pain. Qty: 25 tablet, Refills: 0    spironolactone (ALDACTONE) 100 MG tablet Take 1 tablet (100 mg total) by mouth 2 (two) times daily. Qty: 60 tablet, Refills: 1      CONTINUE these medications which have NOT CHANGED   Details  calcium carbonate (TUMS EX) 750 MG chewable tablet Chew 3 tablets by mouth 4 (four) times daily as needed for heartburn.    folic acid (FOLVITE) 1 MG tablet Take 1 tablet (1 mg total) by mouth daily. Qty: 30 tablet, Refills: 0    lactulose (CHRONULAC) 10 GM/15ML solution Take 30 mLs (20 g total) by mouth 3 (three) times daily. Hold for the day after you have 3 soft stool/day. Qty: 240 mL, Refills: 11    LANTUS 100 UNIT/ML injection INJECT 0.2 MLS (20 UNITS TOTAL) INTO THE SKIN AT BEDTIME. Qty: 10 mL, Refills: 1    metFORMIN (GLUCOPHAGE) 500 MG tablet TAKE 1 TABLET (500 MG TOTAL) BY MOUTH 2 (TWO) TIMES DAILY WITH A MEAL. Qty: 180 tablet, Refills: 1    Multiple Vitamin (MULTIVITAMIN WITH MINERALS) TABS tablet Take 1 tablet by mouth daily.  NOVOLOG FLEXPEN 100 UNIT/ML FlexPen INJECT 2 UNITS INTO SKIN 3 TIMES DAILY BEFORE MEALS Qty: 15 mL, Refills: 2    sertraline (ZOLOFT) 100 MG tablet Take 150 mg by mouth daily.    thiamine 100 MG tablet Take 1 tablet (100 mg total) by mouth daily. Qty: 30 tablet, Refills: 0    rifaximin (XIFAXAN) 550 MG TABS tablet Take 1 tablet (550 mg total) by mouth 2 (two) times daily. Qty: 60 tablet, Refills: 0      STOP taking these medications     ibuprofen (ADVIL,MOTRIN) 200 MG tablet        Allergies  Allergen Reactions  . Betadine [Povidone Iodine] Hives   Follow-up Information    Follow up with Silvano Rusk, MD On 08/24/2015.   Specialty:  Gastroenterology   Why:  10:15 am   Contact information:   [address] [place] [address] [phone]       Follow up with Meredith Cower, MD. Schedule an appointment as soon as possible for a visit in 10 days.   Specialties:   Internal Medicine, Radiology   Contact information:   [address] [place] [address] [phone]       The results of significant diagnostics from this hospitalization (including imaging, microbiology, ancillary and laboratory) are listed below for reference.    Significant Diagnostic Studies: US Abdomen Complete  08/07/2015  CLINICAL DATA:  Abdominal pain and distention for 2 days. History of cirrhosis. EXAM: ABDOMEN ULTRASOUND COMPLETE COMPARISON:  Abdominal MRI 05/26/2015, CT 03/28/2015 FINDINGS: Gallbladder: Contracted with diffuse wall thickening wall thickness measures 4.5 mm. Cholelithiasis with largest stone measuring 4 mm. Common bile duct: Diameter: 3 mm at the porta hepatis, distal common bile duct is not well visualized. Liver: No focal lesion identified. Heterogeneous parenchyma with nodular contour. Normal directional flow in the imaged portal venous system. IVC: No abnormality visualized. Pancreas: Not well visualized. Spleen: 11.1 x 14.8 x 6.3 cm for a volume of 538 cc. Right Kidney: Length: 12.1 cm. Echogenicity within normal limits. No mass or hydronephrosis visualized. Left Kidney: Length: 12.6 cm. Echogenicity within normal limits. No mass or hydronephrosis visualized. Abdominal aorta: No aneurysm visualized. Other findings: Small to moderate perihepatic ascites. IMPRESSION: 1. Cirrhosis with splenomegaly. 2. Contracted gallbladder with gallstones and wall thickening. Wall thickening likely secondary to chronic liver disease. 3. Small to moderate perihepatic ascites. Electronically Signed   By: Jeb Levering M.D.   On: 08/07/2015 19:58   US Paracentesis  08/08/2015  CLINICAL DATA:  Hepatitis-C, cirrhosis, recurrent ascites, abdominal distension EXAM: ULTRASOUND GUIDED PARACENTESIS TECHNIQUE: The procedure, risks (including but not limited to bleeding, infection, organ damage ), benefits, and alternatives were explained to the patient. Questions regarding the  procedure were encouraged and answered. The patient understands and consents to the procedure. Survey ultrasound of the abdomen was performed and an appropriate skin entry site in the right lateral abdomen was selected. Skin site was marked, prepped with chlorhexidine, and draped in usual sterile fashion, and infiltrated locally with 1% lidocaine. A Safe-T-Centesis sheath needle was advanced into the peritoneal space until fluid could be aspirated. The sheath was advanced and the needle removed. 1.8 L of clear yellowascites were aspirated. Samples of the aspirate were sent for the requested laboratory studies. COMPLICATIONS: COMPLICATIONS none IMPRESSION: Technically successful ultrasound guided paracentesis, removing 1.8 L of ascites. Electronically Signed   By: Lucrezia Europe M.D.   On: 08/08/2015 16:59    Microbiology: Recent Results (from the past  240 hour(s))  Culture, body fluid-bottle     Status: None (Preliminary result)   Collection Time: 08/08/15  4:17 PM  Result Value Ref Range Status   Specimen Description FLUID ASCITIC  Final   Special Requests BOTTLES DRAWN AEROBIC AND ANAEROBIC 10CC  Final   Culture   Final    NO GROWTH < 24 HOURS Performed at [facility]    Report Status PENDING  Incomplete  Gram stain     Status: None   Collection Time: 08/08/15  4:17 PM  Result Value Ref Range Status   Specimen Description FLUID ASCITIC  Final   Special Requests NONE  Final   Gram Stain   Final    CYTOSPIN SMEAR WBC PRESENT,BOTH PMN AND MONONUCLEAR NO ORGANISMS SEEN Performed at [facility]    Report Status 08/08/2015 FINAL  Final     Labs: Basic Metabolic Panel:  Recent Labs Lab 08/07/15 1307 08/07/15 2202 08/08/15 0503 08/09/15 0459 08/10/15 0503  NA 130*  --  128* 129* 133*  K 3.9  --  3.3* 4.4 3.9  CL 95*  --  96* 97* 96*  CO2 22  --  25 24 28   GLUCOSE 451*  --  290* 353* 384*  BUN 8  --  5* 6 6  CREATININE 0.55  --  0.48 0.56 0.59  CALCIUM 8.9  --  7.9*  8.0* 8.5*  MG  --  1.3*  --   --  1.5*  PHOS  --  2.6  --   --   --    Liver Function Tests:  Recent Labs Lab 08/07/15 1307 08/08/15 0503  AST 45* 45*  ALT 63* 54  ALKPHOS 111 86  BILITOT 1.5* 1.3*  PROT 7.7 6.8  ALBUMIN 3.1* 2.7*    Recent Labs Lab 08/07/15 1307  LIPASE 75*   CBC:  Recent Labs Lab 08/07/15 1307 08/07/15 2202 08/08/15 0503 08/09/15 0459  WBC 4.6  --  3.3* 2.8*  NEUTROABS  --   --  1.8  --   HGB 10.7* 10.0* 10.1* 9.9*  HCT 32.0* 28.6* 29.1* 29.4*  MCV 105.6*  --  103.6* 105.4*  PLT 143*  --  121* 108*   BNP (last 3 results)  Recent Labs  05/23/15 2139  BNP 21.8   CBG:  Recent Labs Lab 08/09/15 1155 08/09/15 1658 08/09/15 2135 08/10/15 0739 08/10/15 1208  GLUCAP 275* 183* 285* 312* 283*    Signed:  Barton Dubois MD.  [facility] 08/10/2015, 2:06 PM

## 2015-08-13 LAB — CULTURE, BODY FLUID-BOTTLE

## 2015-08-13 LAB — CULTURE, BODY FLUID W GRAM STAIN -BOTTLE: Culture: NO GROWTH

## 2015-08-24 ENCOUNTER — Encounter: Payer: Self-pay | Admitting: Internal Medicine

## 2015-08-24 ENCOUNTER — Telehealth: Payer: Self-pay

## 2015-08-24 ENCOUNTER — Ambulatory Visit: Payer: Self-pay | Admitting: Family

## 2015-08-24 ENCOUNTER — Other Ambulatory Visit (INDEPENDENT_AMBULATORY_CARE_PROVIDER_SITE_OTHER): Payer: Medicaid Other

## 2015-08-24 ENCOUNTER — Ambulatory Visit (INDEPENDENT_AMBULATORY_CARE_PROVIDER_SITE_OTHER): Payer: Medicaid Other | Admitting: Internal Medicine

## 2015-08-24 VITALS — BP 100/68 | HR 100 | Ht 62.25 in | Wt 108.2 lb

## 2015-08-24 DIAGNOSIS — K7031 Alcoholic cirrhosis of liver with ascites: Secondary | ICD-10-CM

## 2015-08-24 DIAGNOSIS — R188 Other ascites: Secondary | ICD-10-CM | POA: Diagnosis not present

## 2015-08-24 DIAGNOSIS — K2960 Other gastritis without bleeding: Secondary | ICD-10-CM

## 2015-08-24 DIAGNOSIS — IMO0001 Reserved for inherently not codable concepts without codable children: Secondary | ICD-10-CM

## 2015-08-24 DIAGNOSIS — B182 Chronic viral hepatitis C: Secondary | ICD-10-CM | POA: Diagnosis not present

## 2015-08-24 DIAGNOSIS — E1165 Type 2 diabetes mellitus with hyperglycemia: Secondary | ICD-10-CM | POA: Diagnosis not present

## 2015-08-24 DIAGNOSIS — R809 Proteinuria, unspecified: Secondary | ICD-10-CM | POA: Diagnosis not present

## 2015-08-24 DIAGNOSIS — K2900 Acute gastritis without bleeding: Secondary | ICD-10-CM

## 2015-08-24 LAB — CBC WITH DIFFERENTIAL/PLATELET
BASOS ABS: 0 10*3/uL (ref 0.0–0.1)
Basophils Relative: 0.5 % (ref 0.0–3.0)
EOS ABS: 0 10*3/uL (ref 0.0–0.7)
Eosinophils Relative: 1.3 % (ref 0.0–5.0)
HEMATOCRIT: 33.3 % — AB (ref 36.0–46.0)
Hemoglobin: 11.1 g/dL — ABNORMAL LOW (ref 12.0–15.0)
LYMPHS ABS: 0.5 10*3/uL — AB (ref 0.7–4.0)
LYMPHS PCT: 17.2 % (ref 12.0–46.0)
MCHC: 33.4 g/dL (ref 30.0–36.0)
MCV: 94.1 fl (ref 78.0–100.0)
Monocytes Absolute: 0.3 10*3/uL (ref 0.1–1.0)
Monocytes Relative: 10.2 % (ref 3.0–12.0)
NEUTROS ABS: 2.1 10*3/uL (ref 1.4–7.7)
NEUTROS PCT: 70.8 % (ref 43.0–77.0)
PLATELETS: 130 10*3/uL — AB (ref 150.0–400.0)
RBC: 3.54 Mil/uL — ABNORMAL LOW (ref 3.87–5.11)
RDW: 18.2 % — ABNORMAL HIGH (ref 11.5–15.5)
WBC: 3 10*3/uL — ABNORMAL LOW (ref 4.0–10.5)

## 2015-08-24 LAB — BASIC METABOLIC PANEL
BUN: 13 mg/dL (ref 6–23)
CALCIUM: 9.9 mg/dL (ref 8.4–10.5)
CHLORIDE: 94 meq/L — AB (ref 96–112)
CO2: 27 meq/L (ref 19–32)
CREATININE: 0.62 mg/dL (ref 0.40–1.20)
GFR: 108.26 mL/min (ref >60.00)
GLUCOSE: 517 mg/dL — AB (ref 70–99)
POTASSIUM: 5 meq/L (ref 3.5–5.1)
Sodium: 129 mEq/L — ABNORMAL LOW (ref 135–145)

## 2015-08-24 MED ORDER — SPIRONOLACTONE 100 MG PO TABS: 200.0000 mg | ORAL_TABLET | Freq: Every day | ORAL | Status: DC

## 2015-08-24 MED ORDER — FUROSEMIDE 40 MG PO TABS: 80.0000 mg | ORAL_TABLET | Freq: Every day | ORAL | Status: DC

## 2015-08-24 MED ORDER — INSULIN GLARGINE 100 UNIT/ML ~~LOC~~ SOLN: SUBCUTANEOUS | Status: DC

## 2015-08-24 MED ORDER — OXYCODONE HCL 5 MG PO TABS: 5.0000 mg | ORAL_TABLET | Freq: Four times a day (QID) | ORAL | Status: DC | PRN

## 2015-08-24 NOTE — Telephone Encounter (Signed)
Critical glucose level of 517.  Per Dr Carlean Purl appointment made downstairs today at 4:30pm with the PA in primary care.  They talked directly with Dr [name] and he has advised that [name] go to the ED as she may need fluids, etc. That they cannot provide.  I spoke with [name] and she said she can get a ride at 6pm today to the ED.

## 2015-08-24 NOTE — Patient Instructions (Signed)
  Your physician has requested that you go to the basement for the following lab work before leaving today: BMET, CBC/diff   Follow up with Dr [name] about your diabetic medicine.  Dr Carlean Purl is increasing your Lantus to 40 units daily.   Today we are giving you a written rx for oxycodone to take to your pharmacy.   We have you set up for a paracentesis tomorrow 08/25/15 at [facility] , arrive at the [facility] to procedure to radiology dept at 10:15AM for a 10:30 AM appointment.   I appreciate the opportunity to care for you.

## 2015-08-24 NOTE — Progress Notes (Signed)
   Subjective:  Cc: abdominal swelling and pain  Patient ID: [name], female    DOB: 03-May-1965, 50 y.o.   MRN: [id number]  HPI Here for hospital f/u - admitted w/ ascites and  GI bleed w/ melena - has alcoholic cirrhosis and HCV. She had a paracentesis - EGD demonstrated erosive gastropathy and Gr 1 non-bleeding esophageal varices. Diuretics were started. She had hyperglycemia and type II DM not controlled also.  Now w/ recurrent abdominal distention - pain. Asking for oxycodone - got 25 at dc 4/20 and is out. No fevers, no bleeding .  Says BS 400's .  Medications, allergies, past medical history, past surgical history, family history and social history are reviewed and updated in the EMR.   Review of Systems As per HPI    Objective:   Physical Exam @BP  100/68 mmHg  Pulse 100  Ht 5' 2.25" (1.581 m)  Wt 108 lb 4 oz (49.102 kg)  BMI 19.64 kg/m2  LMP 11/01/2011@  General:  Thin NAD Eyes:  anicteric. Lungs: Clear to auscultation bilaterally. Heart:  S1S2, no rubs, murmurs, gallops. Abdomen:  distended w/ mod ascites - diffusely tender no masses Extremities:   no edema, cyanosis or clubbing Skin   tanned. Neuro:  A&O x 3. No asterixis Psych:  appropriate mood and  Affect.   Data Reviewed:  As per HPI, hospital labs      Assessment & Plan:   Encounter Diagnoses  Name Primary?  . Ascites Yes  . Alcoholic cirrhosis of liver with ascites ([place])   . Uncontrolled type 2 diabetes mellitus with microalbuminuria or microproteinuria   . Chronic hepatitis C without hepatic coma ([place])   . Erosive gastritis      Chemistry      Component Value Date/Time   NA 129* 08/24/2015 1122   K 5.0 08/24/2015 1122   CL 94* 08/24/2015 1122   CO2 27 08/24/2015 1122   BUN 13 08/24/2015 1122   CREATININE 0.62 08/24/2015 1122      Component Value Date/Time   CALCIUM 9.9 08/24/2015 1122   ALKPHOS 86 08/08/2015 0503   AST 45* 08/08/2015 0503   ALT 54 08/08/2015 0503   BILITOT 1.3*  08/08/2015 0503     CBC Latest Ref Rng 08/24/2015 08/09/2015 08/08/2015  WBC 4.0 - 10.5 K/uL 3.0(L) 2.8(L) 3.3(L)  Hemoglobin 12.0 - 15.0 g/dL 11.1(L) 9.9(L) 10.1(L)  Hematocrit 36.0 - 46.0 % 33.3(L) 29.4(L) 29.1(L)  Platelets 150.0 - 400.0 K/uL 130.0(L) 108(L) 121(L)   We arranged for a repeat paracentesis tomorrow. I had instructed her to increase Lantus to 40 U at bedtime. BS back > 500 - contacted PCP - he advised ED visit and she told us she would go. Hyponatremia pseudohyponatremia in part.  I rxed #10 oxycodone 5 mg and told her no more - will not do chronically. Pain issues long-standing problem - hard to sort out if due to ascites or other = hx polysubstance abuse.  Will arrange f/u after above complete. She could end up getting admited but hyperglycemia fairly chronic and she did not look toxic.  Will need to have her see [facility] or [facility] re: HCV Tx  Stay on PPI for gastritis  Consider switching Atenolol to non-selective beta blocker like Nadolol though do not think bleeding from portal htn   [id number] [name], MD

## 2015-08-25 ENCOUNTER — Ambulatory Visit (HOSPITAL_COMMUNITY)
Admission: RE | Admit: 2015-08-25 | Discharge: 2015-08-25 | Disposition: A | Discharge status: Home / Self Care | Payer: Medicaid Other | Source: Ambulatory Visit | Attending: Internal Medicine | Admitting: Internal Medicine

## 2015-08-25 DIAGNOSIS — K7031 Alcoholic cirrhosis of liver with ascites: Secondary | ICD-10-CM

## 2015-08-25 DIAGNOSIS — R188 Other ascites: Secondary | ICD-10-CM | POA: Insufficient documentation

## 2015-08-25 LAB — BODY FLUID CELL COUNT WITH DIFFERENTIAL
EOS FL: 0 %
Lymphs, Fluid: 10 %
Monocyte-Macrophage-Serous Fluid: 84 % (ref 50–90)
NEUTROPHIL FLUID: 6 % (ref 0–25)
WBC FLUID: 296 uL (ref 0–1000)

## 2015-08-25 LAB — GRAM STAIN

## 2015-08-25 MED ORDER — ALBUMIN HUMAN 25 % IV SOLN
12.5000 g | Freq: Once | INTRAVENOUS | Status: AC
  Administered 2015-08-25: 12.5 g via INTRAVENOUS
  Filled 2015-08-25: qty 50

## 2015-08-25 MED ORDER — ALBUMIN HUMAN 25 % IV SOLN: 12.5000 g | Freq: Once | INTRAVENOUS | Status: DC

## 2015-08-25 MED ORDER — LIDOCAINE HCL (PF) 1 % IJ SOLN
INTRAMUSCULAR | Status: AC
  Filled 2015-08-25: qty 10

## 2015-08-25 NOTE — Progress Notes (Signed)
Quick Note:  Please see if cytology can be added to fluid collected - check with lab and radiology ______

## 2015-08-25 NOTE — Progress Notes (Signed)
Arrived from radiology via wheelchair. IV started and albumin administered without incident. Returned to front of hospital for her husband to pick up. No complications.

## 2015-08-25 NOTE — Progress Notes (Signed)
Quick Note:  Does nor look like she went to ED as instructed Ask what her sugars were today after higher Lantus dose Assume she is still going for the paracentesis   ______

## 2015-08-25 NOTE — Procedures (Signed)
   US guided RLQ paracentesis 1.6L yellow fluid  Sent for labs  Tolerated well  Post IV Albumin per MD 10 gr/L

## 2015-08-28 LAB — PATHOLOGIST SMEAR REVIEW

## 2015-08-30 LAB — CULTURE, BODY FLUID-BOTTLE

## 2015-08-30 LAB — CULTURE, BODY FLUID W GRAM STAIN -BOTTLE: Culture: NO GROWTH

## 2015-09-01 ENCOUNTER — Telehealth: Payer: Self-pay | Admitting: Internal Medicine

## 2015-09-01 NOTE — Telephone Encounter (Signed)
Please advise Sir? 

## 2015-09-03 NOTE — Telephone Encounter (Signed)
Needs to see or ask PCP about this

## 2015-09-04 NOTE — Telephone Encounter (Signed)
Patient informed to call PCP and she verbalized understanding.

## 2015-09-14 NOTE — Progress Notes (Signed)
Quick Note:  Does not look like she has followed up re: uncontrolled diabetes Very important she do so - she was supposed to see Dr. [name] (since she did not go to ED)  She may get very ill again and end up in hospital - really needs to get sugar under better control  Let her know please ______

## 2015-09-19 NOTE — Progress Notes (Signed)
Quick Note:  Make Lantus 80 units at bedtime Novolog needs to be 4 Units with meals We can make some refills if needed She needs to keep blood sugar records and check on her in 1 week for update ______

## 2015-10-21 ENCOUNTER — Emergency Department (HOSPITAL_COMMUNITY): Payer: Medicaid Other

## 2015-10-21 ENCOUNTER — Inpatient Hospital Stay (HOSPITAL_COMMUNITY)
Admission: EM | Admit: 2015-10-21 | Discharge: 2015-10-27 | DRG: 871 | Disposition: A | Discharge status: Home / Self Care | Payer: Medicaid Other | Attending: Internal Medicine | Admitting: Internal Medicine

## 2015-10-21 ENCOUNTER — Encounter (HOSPITAL_COMMUNITY): Payer: Self-pay

## 2015-10-21 DIAGNOSIS — E1310 Other specified diabetes mellitus with ketoacidosis without coma: Secondary | ICD-10-CM | POA: Diagnosis present

## 2015-10-21 DIAGNOSIS — E43 Unspecified severe protein-calorie malnutrition: Secondary | ICD-10-CM | POA: Diagnosis present

## 2015-10-21 DIAGNOSIS — F1020 Alcohol dependence, uncomplicated: Secondary | ICD-10-CM | POA: Diagnosis present

## 2015-10-21 DIAGNOSIS — R1084 Generalized abdominal pain: Secondary | ICD-10-CM

## 2015-10-21 DIAGNOSIS — K766 Portal hypertension: Secondary | ICD-10-CM | POA: Diagnosis present

## 2015-10-21 DIAGNOSIS — F10239 Alcohol dependence with withdrawal, unspecified: Secondary | ICD-10-CM | POA: Diagnosis present

## 2015-10-21 DIAGNOSIS — F1410 Cocaine abuse, uncomplicated: Secondary | ICD-10-CM | POA: Diagnosis present

## 2015-10-21 DIAGNOSIS — B1920 Unspecified viral hepatitis C without hepatic coma: Secondary | ICD-10-CM | POA: Diagnosis present

## 2015-10-21 DIAGNOSIS — Z79899 Other long term (current) drug therapy: Secondary | ICD-10-CM

## 2015-10-21 DIAGNOSIS — K652 Spontaneous bacterial peritonitis: Secondary | ICD-10-CM | POA: Diagnosis present

## 2015-10-21 DIAGNOSIS — Z681 Body mass index (BMI) 19 or less, adult: Secondary | ICD-10-CM

## 2015-10-21 DIAGNOSIS — Z8041 Family history of malignant neoplasm of ovary: Secondary | ICD-10-CM

## 2015-10-21 DIAGNOSIS — G8929 Other chronic pain: Secondary | ICD-10-CM | POA: Diagnosis present

## 2015-10-21 DIAGNOSIS — F329 Major depressive disorder, single episode, unspecified: Secondary | ICD-10-CM | POA: Diagnosis present

## 2015-10-21 DIAGNOSIS — D731 Hypersplenism: Secondary | ICD-10-CM | POA: Diagnosis present

## 2015-10-21 DIAGNOSIS — K7031 Alcoholic cirrhosis of liver with ascites: Secondary | ICD-10-CM | POA: Diagnosis present

## 2015-10-21 DIAGNOSIS — Z888 Allergy status to other drugs, medicaments and biological substances status: Secondary | ICD-10-CM

## 2015-10-21 DIAGNOSIS — E871 Hypo-osmolality and hyponatremia: Secondary | ICD-10-CM | POA: Diagnosis present

## 2015-10-21 DIAGNOSIS — K861 Other chronic pancreatitis: Secondary | ICD-10-CM | POA: Diagnosis present

## 2015-10-21 DIAGNOSIS — I8500 Esophageal varices without bleeding: Secondary | ICD-10-CM | POA: Diagnosis present

## 2015-10-21 DIAGNOSIS — Z9049 Acquired absence of other specified parts of digestive tract: Secondary | ICD-10-CM

## 2015-10-21 DIAGNOSIS — K8681 Exocrine pancreatic insufficiency: Secondary | ICD-10-CM | POA: Diagnosis present

## 2015-10-21 DIAGNOSIS — E1165 Type 2 diabetes mellitus with hyperglycemia: Secondary | ICD-10-CM | POA: Diagnosis present

## 2015-10-21 DIAGNOSIS — F419 Anxiety disorder, unspecified: Secondary | ICD-10-CM | POA: Diagnosis present

## 2015-10-21 DIAGNOSIS — A419 Sepsis, unspecified organism: Principal | ICD-10-CM | POA: Diagnosis present

## 2015-10-21 DIAGNOSIS — D61818 Other pancytopenia: Secondary | ICD-10-CM | POA: Diagnosis present

## 2015-10-21 DIAGNOSIS — K5909 Other constipation: Secondary | ICD-10-CM | POA: Diagnosis present

## 2015-10-21 DIAGNOSIS — K3189 Other diseases of stomach and duodenum: Secondary | ICD-10-CM | POA: Diagnosis present

## 2015-10-21 DIAGNOSIS — IMO0002 Reserved for concepts with insufficient information to code with codable children: Secondary | ICD-10-CM | POA: Diagnosis present

## 2015-10-21 DIAGNOSIS — K219 Gastro-esophageal reflux disease without esophagitis: Secondary | ICD-10-CM | POA: Diagnosis present

## 2015-10-21 DIAGNOSIS — Z794 Long term (current) use of insulin: Secondary | ICD-10-CM

## 2015-10-21 DIAGNOSIS — R188 Other ascites: Secondary | ICD-10-CM | POA: Diagnosis present

## 2015-10-21 DIAGNOSIS — R109 Unspecified abdominal pain: Secondary | ICD-10-CM | POA: Diagnosis present

## 2015-10-21 DIAGNOSIS — K8020 Calculus of gallbladder without cholecystitis without obstruction: Secondary | ICD-10-CM | POA: Diagnosis present

## 2015-10-21 DIAGNOSIS — Z833 Family history of diabetes mellitus: Secondary | ICD-10-CM

## 2015-10-21 LAB — URINALYSIS, ROUTINE W REFLEX MICROSCOPIC
BILIRUBIN URINE: NEGATIVE
Glucose, UA: >1000 mg/dL — AB
HGB URINE DIPSTICK: NEGATIVE
KETONES UR: 40 mg/dL — AB
Leukocytes, UA: NEGATIVE
NITRITE: NEGATIVE
PROTEIN: NEGATIVE mg/dL
Specific Gravity, Urine: 1.041 — ABNORMAL HIGH (ref 1.005–1.030)
pH: 6 (ref 5.0–8.0)

## 2015-10-21 LAB — COMPREHENSIVE METABOLIC PANEL
ALT: 58 U/L — AB (ref 14–54)
ANION GAP: 17 — AB (ref 5–15)
AST: 83 U/L — ABNORMAL HIGH (ref 15–41)
Albumin: 3 g/dL — ABNORMAL LOW (ref 3.5–5.0)
Alkaline Phosphatase: 154 U/L — ABNORMAL HIGH (ref 38–126)
BUN: 8 mg/dL (ref 6–20)
CO2: 18 mmol/L — ABNORMAL LOW (ref 22–32)
CREATININE: 0.51 mg/dL (ref 0.44–1.00)
Calcium: 8.3 mg/dL — ABNORMAL LOW (ref 8.9–10.3)
Chloride: 95 mmol/L — ABNORMAL LOW (ref 101–111)
Glucose, Bld: 473 mg/dL — ABNORMAL HIGH (ref 65–99)
Potassium: 3.8 mmol/L (ref 3.5–5.1)
Sodium: 130 mmol/L — ABNORMAL LOW (ref 135–145)
Total Bilirubin: 1.6 mg/dL — ABNORMAL HIGH (ref 0.3–1.2)
Total Protein: 8 g/dL (ref 6.5–8.1)

## 2015-10-21 LAB — CBC WITH DIFFERENTIAL/PLATELET
Basophils Absolute: 0 10*3/uL (ref 0.0–0.1)
Basophils Relative: 1 %
EOS PCT: 1 %
Eosinophils Absolute: 0.1 10*3/uL (ref 0.0–0.7)
HCT: 33.4 % — ABNORMAL LOW (ref 36.0–46.0)
HEMOGLOBIN: 10.8 g/dL — AB (ref 12.0–15.0)
LYMPHS ABS: 0.6 10*3/uL — AB (ref 0.7–4.0)
LYMPHS PCT: 16 %
MCH: 30.3 pg (ref 26.0–34.0)
MCHC: 32.3 g/dL (ref 30.0–36.0)
MCV: 93.6 fL (ref 78.0–100.0)
Monocytes Absolute: 0.5 10*3/uL (ref 0.1–1.0)
Monocytes Relative: 14 %
NEUTROS PCT: 68 %
Neutro Abs: 2.6 10*3/uL (ref 1.7–7.7)
PLATELETS: 110 10*3/uL — AB (ref 150–400)
RBC: 3.57 MIL/uL — AB (ref 3.87–5.11)
RDW: 19.6 % — AB (ref 11.5–15.5)
WBC: 3.9 10*3/uL — AB (ref 4.0–10.5)

## 2015-10-21 LAB — PROTIME-INR
INR: 1.3 (ref 0.00–1.49)
PROTHROMBIN TIME: 15.8 s — AB (ref 11.6–15.2)

## 2015-10-21 LAB — CBG MONITORING, ED: Glucose-Capillary: 409 mg/dL — ABNORMAL HIGH (ref 65–99)

## 2015-10-21 LAB — I-STAT CG4 LACTIC ACID, ED
LACTIC ACID, VENOUS: 4.49 mmol/L — AB (ref 0.5–1.9)
LACTIC ACID, VENOUS: 4.24 mmol/L — AB (ref 0.5–1.9)

## 2015-10-21 LAB — URINE MICROSCOPIC-ADD ON: RBC / HPF: NONE SEEN RBC/hpf (ref 0–5)

## 2015-10-21 LAB — LIPASE, BLOOD: LIPASE: 71 U/L — AB (ref 11–51)

## 2015-10-21 LAB — APTT: APTT: 27 s (ref 24–37)

## 2015-10-21 MED ORDER — HYDROMORPHONE HCL 1 MG/ML IJ SOLN
1.0000 mg | Freq: Once | INTRAMUSCULAR | Status: AC
  Administered 2015-10-21: 1 mg via INTRAVENOUS
  Filled 2015-10-21: qty 1

## 2015-10-21 MED ORDER — LIDOCAINE-EPINEPHRINE 2 %-1:100000 IJ SOLN
20.0000 mL | Freq: Once | INTRAMUSCULAR | Status: AC
  Administered 2015-10-21: 20 mL

## 2015-10-21 MED ORDER — LIDOCAINE-EPINEPHRINE 2 %-1:100000 IJ SOLN
INTRAMUSCULAR | Status: AC
Start: 2015-10-21 — End: 2015-10-22
  Filled 2015-10-21: qty 1

## 2015-10-21 MED ORDER — DIATRIZOATE MEGLUMINE & SODIUM 66-10 % PO SOLN
30.0000 mL | Freq: Once | ORAL | Status: AC
  Administered 2015-10-21: 30 mL via ORAL

## 2015-10-21 MED ORDER — DEXTROSE 5 % IV SOLN
1.0000 g | Freq: Once | INTRAVENOUS | Status: AC
  Administered 2015-10-22: 1 g via INTRAVENOUS
  Filled 2015-10-21: qty 10

## 2015-10-21 MED ORDER — MORPHINE SULFATE (PF) 4 MG/ML IV SOLN
4.0000 mg | Freq: Once | INTRAVENOUS | Status: AC
  Administered 2015-10-21: 4 mg via INTRAVENOUS
  Filled 2015-10-21: qty 1

## 2015-10-21 MED ORDER — SODIUM CHLORIDE 0.9 % IV BOLUS (SEPSIS)
1000.0000 mL | Freq: Once | INTRAVENOUS | Status: AC
  Administered 2015-10-21: 1000 mL via INTRAVENOUS

## 2015-10-21 MED ORDER — IOPAMIDOL (ISOVUE-300) INJECTION 61%
100.0000 mL | Freq: Once | INTRAVENOUS | Status: AC | PRN
  Administered 2015-10-21: 80 mL via INTRAVENOUS

## 2015-10-21 MED ORDER — ONDANSETRON HCL 4 MG/2ML IJ SOLN
4.0000 mg | Freq: Once | INTRAMUSCULAR | Status: AC
  Administered 2015-10-21: 4 mg via INTRAVENOUS
  Filled 2015-10-21: qty 2

## 2015-10-21 NOTE — ED Notes (Signed)
DR. Oleta Mouse AND RN NOTIFIED OF PATIENTS'S LACTIC ACID OF 4.49

## 2015-10-21 NOTE — ED Notes (Signed)
Bed: [id number] Expected date:  Expected time:  Means of arrival:  Comments: 54 F abd pain/tachycardia

## 2015-10-21 NOTE — ED Provider Notes (Signed)
CSN: [id number]     Arrival date & time 10/21/15  1930 History   First MD Initiated Contact with Patient 10/21/15 1932     Chief Complaint  Patient presents with  . Abdominal Pain  . Tachycardia     (Consider location/radiation/quality/duration/timing/severity/associated sxs/prior Treatment) HPI 50 year old female who presents with abdominal pain. She has a history of diabetes, alcoholic cirrhosis complicated by ascites, hepatitis C, appendectomy, cholelithiasis, and chronic pancreatitis. States gradually worsening abdominal pain and abdominal distention over this past week. Last underwent ultrasound-guided paracentesis in May. Follows with Dr. Carlean Purl. Has had subjective fevers with chills and episode of vomiting yesterday that was nonbloody and nonbilious. Has not had any melena or hematochezia, dysuria or urinary frequency, chest pain or difficulty breathing. Past Medical History  Diagnosis Date  . Impaired glucose tolerance 11/07/2010  . Hepatitis C approx dx 2005    no tx to date  . Pancreatitis     HX of  . Alcohol dependency ([place])     at least [facility] admissions 2007- 09/2013 for detox.   Marland Kitchen GERD (gastroesophageal reflux disease)   . Chronic constipation   . Gallstones   . Drug dependency ([place])     Hx of cocaine use  . Anxiety and depression 08/2011  . Rheumatoid factor positive   . Hemochromatosis     last phlebotomy tx ~ 2012. liver bx 2006  . Pancytopenia   . Abnormal vaginal bleeding     uterine fibroid  . Gallstone 2006  . Chronic abdominal pain 2011  . Cirrhosis ([place]) 11/09/2010  . Type II or unspecified type diabetes mellitus without mention of complication, uncontrolled 09/17/2011  . Major depression ([place]) 11/2012, 09/2013    [facility] admissions for this, suicide attempt by OD Ambien (2014), Prozac (2015),ETOHism   . Anxiety    Past Surgical History  Procedure Laterality Date  . Appendectomy    . Cesarean section  x 2  . Foot surgery  2013  . Abdominal hysterectomy   12/17/2011    Procedure: HYSTERECTOMY ABDOMINAL;  Surgeon: Gus Height, MD;  Location: [facility];  Service: Gynecology;  Laterality: N/A;  . Salpingoophorectomy  12/17/2011    Procedure: SALPINGO OOPHERECTOMY;  Surgeon: Gus Height, MD;  Location: [facility];  Service: Gynecology;  Laterality: Bilateral;  . Esophagogastroduodenoscopy N/A 12/10/2013    Procedure: ESOPHAGOGASTRODUODENOSCOPY (EGD);  Surgeon: Ladene Artist, MD;  Location: [facility];  Service: Endoscopy;  Laterality: N/A;  . Percutaneous liver biopsy  2011  . Colonoscopy N/A 02/18/2014    Procedure: COLONOSCOPY;  Surgeon: Gatha Mayer, MD;  Location: [facility];  Service: Endoscopy;  Laterality: N/A;  . Esophagogastroduodenoscopy (egd) with propofol N/A 08/08/2015    Procedure: ESOPHAGOGASTRODUODENOSCOPY (EGD) WITH PROPOFOL;  Surgeon: Gatha Mayer, MD;  Location: [facility];  Service: Endoscopy;  Laterality: N/A;   Family History  Problem Relation Age of Onset  . Cancer Mother     ovarian  . Diabetes Father    Social History  Substance Use Topics  . Smoking status: Never Smoker   . Smokeless tobacco: Never Used  . Alcohol Use: Yes     Comment: one pint vodka daily years   OB History    Gravida Para Term Preterm AB TAB SAB Ectopic Multiple Living   2 2 2       3      Review of Systems 10/14 systems reviewed and are negative other than those stated in the HPI    Allergies  Betadine  Home Medications   Prior to Admission medications   Medication Sig Start Date End Date Taking? Authorizing Provider  atenolol (TENORMIN) 25 MG tablet Take 0.5 tablets (12.5 mg total) by mouth daily. 08/10/15  Yes Barton Dubois, MD  calcium carbonate (TUMS EX) 750 MG chewable tablet Chew 3 tablets by mouth 4 (four) times daily as needed for heartburn.   Yes Historical Provider, MD  folic acid (FOLVITE) 1 MG tablet Take 1 tablet (1 mg total) by mouth daily. 05/27/15  Yes Lavina Hamman, MD  furosemide (LASIX) 40 MG tablet Take 2 tablets  (80 mg total) by mouth daily. 08/24/15  Yes Gatha Mayer, MD  insulin aspart (NOVOLOG FLEXPEN) 100 UNIT/ML FlexPen Inject 4 Units into the skin 3 (three) times daily with meals.    Yes Historical Provider, MD  insulin glargine (LANTUS) 100 UNIT/ML injection INJECT 0.4 MLS (40 UNITS TOTAL) INTO THE SKIN AT BEDTIME. 08/24/15  Yes Gatha Mayer, MD  Insulin Pen Needle (BD PEN NEEDLE NANO U/F) 32G X 4 MM MISC Use as needed to inject insulin as prescribed 08/10/15  Yes Barton Dubois, MD  KLOR-CON M10 10 MEQ tablet Take 1 tablet (10 mEq total) by mouth daily. 08/10/15  Yes Barton Dubois, MD  lactulose (CHRONULAC) 10 GM/15ML solution Take 30 mLs (20 g total) by mouth 3 (three) times daily. Hold for the day after you have 3 soft stool/day. 05/27/15  Yes Lavina Hamman, MD  metFORMIN (GLUCOPHAGE) 500 MG tablet TAKE 1 TABLET (500 MG TOTAL) BY MOUTH 2 (TWO) TIMES DAILY WITH A MEAL. 05/22/15  Yes Biagio Borg, MD  Multiple Vitamin (MULTIVITAMIN WITH MINERALS) TABS tablet Take 1 tablet by mouth daily.    Yes Historical Provider, MD  pantoprazole (PROTONIX) 40 MG tablet Take 1 tablet (40 mg total) by mouth 2 (two) times daily. 08/10/15  Yes Barton Dubois, MD  sertraline (ZOLOFT) 100 MG tablet Take 150 mg by mouth daily.   Yes Historical Provider, MD  spironolactone (ALDACTONE) 100 MG tablet Take 2 tablets (200 mg total) by mouth daily. 08/24/15  Yes Gatha Mayer, MD  thiamine 100 MG tablet Take 1 tablet (100 mg total) by mouth daily. 05/27/15  Yes Lavina Hamman, MD  diphenhydrAMINE (BENADRYL) 25 MG tablet Take 4 tablets (100 mg total) by mouth at bedtime as needed for sleep. Patient not taking: Reported on 10/21/2015 08/10/15   Barton Dubois, MD  nystatin cream (MYCOSTATIN) Apply topically 3 (three) times daily. Apply to groin area and inner thighs Patient not taking: Reported on 10/21/2015 08/10/15   Barton Dubois, MD  oxyCODONE (OXY IR/ROXICODONE) 5 MG immediate release tablet Take 1 tablet (5 mg total) by mouth every 6 (six)  hours as needed for severe pain. Patient not taking: Reported on 10/21/2015 08/24/15   Gatha Mayer, MD  rifaximin (XIFAXAN) 550 MG TABS tablet Take 1 tablet (550 mg total) by mouth 2 (two) times daily. Patient not taking: Reported on 10/21/2015 05/27/15   Lavina Hamman, MD   BP 125/108 mmHg  Pulse 113  Temp(Src) 98.4 F (36.9 C)  Resp 18  SpO2 98%  LMP 11/01/2011 Physical Exam Physical Exam  Nursing note and vitals reviewed. Constitutional: Well nourished and chronically ill-appearing, non-toxic, and in no acute distress Head: Normocephalic and atraumatic.  Mouth/Throat: Oropharynx is clear and dry.  Neck: Normal range of motion. Neck supple.  Cardiovascular: Normal rate and regular rhythm.   Pulmonary/Chest: Effort normal and breath sounds normal.  Abdominal: Soft.  Distended. There is diffuse tenderness. There is no rebound. There is guarding. No CVA tenderness Musculoskeletal: Normal range of motion.  Neurological: Alert, no facial droop, fluent speech, moves all extremities symmetrically Skin: Skin is warm and dry.  Psychiatric: Cooperative  ED Course  Procedures (including critical care time) Labs Review Labs Reviewed  CBC WITH DIFFERENTIAL/PLATELET - Abnormal; Notable for the following:    WBC 3.9 (*)    RBC 3.57 (*)    Hemoglobin 10.8 (*)    HCT 33.4 (*)    RDW 19.6 (*)    Platelets 110 (*)    Lymphs Abs 0.6 (*)    All other components within normal limits  COMPREHENSIVE METABOLIC PANEL - Abnormal; Notable for the following:    Sodium 130 (*)    Chloride 95 (*)    CO2 18 (*)    Glucose, Bld 473 (*)    Calcium 8.3 (*)    Albumin 3.0 (*)    AST 83 (*)    ALT 58 (*)    Alkaline Phosphatase 154 (*)    Total Bilirubin 1.6 (*)    Anion gap 17 (*)    All other components within normal limits  LIPASE, BLOOD - Abnormal; Notable for the following:    Lipase 71 (*)    All other components within normal limits  PROTIME-INR - Abnormal; Notable for the following:     Prothrombin Time 15.8 (*)    All other components within normal limits  URINALYSIS, ROUTINE W REFLEX MICROSCOPIC (NOT AT [facility]) - Abnormal; Notable for the following:    Specific Gravity, Urine 1.041 (*)    Glucose, UA >1000 (*)    Ketones, ur 40 (*)    All other components within normal limits  URINE MICROSCOPIC-ADD ON - Abnormal; Notable for the following:    Squamous Epithelial / LPF 0-5 (*)    Bacteria, UA RARE (*)    All other components within normal limits  I-STAT CG4 LACTIC ACID, ED - Abnormal; Notable for the following:    Lactic Acid, Venous 4.49 (*)    All other components within normal limits  I-STAT CG4 LACTIC ACID, ED - Abnormal; Notable for the following:    Lactic Acid, Venous 4.24 (*)    All other components within normal limits  CBG MONITORING, ED - Abnormal; Notable for the following:    Glucose-Capillary 409 (*)    All other components within normal limits  APTT  BASIC METABOLIC PANEL    Imaging Review Ct Abdomen Pelvis W Contrast  10/21/2015  CLINICAL DATA:  Abdominal pain in a patient with a history of alcoholic cirrhosis, hepatitis-C and chronic pancreatitis. EXAM: CT ABDOMEN AND PELVIS WITH CONTRAST TECHNIQUE: Multidetector CT imaging of the abdomen and pelvis was performed using the standard protocol following bolus administration of intravenous contrast. CONTRAST:  54mL ISOVUE-300 IOPAMIDOL (ISOVUE-300) INJECTION 61% COMPARISON:  03/30/2015 FINDINGS: Lower chest:  Dependent atelectasis. Hepatobiliary: Markedly nodular liver contour compatible with reported history of cirrhosis. No evidence for enhancing lesion within the hepatic parenchyma 5 mm gallstone identified in the gallbladder. No intrahepatic or extrahepatic biliary dilation. Pancreas: No focal mass lesion. No dilatation of the main duct. No intraparenchymal cyst. No peripancreatic edema. Spleen: Spleen is 14 cm cranial caudal length, upper normal Adrenals/Urinary Tract: No adrenal nodule or mass. Kidneys are  unremarkable. No evidence of hydroureter. The urinary bladder appears normal for the degree of distention. Stomach/Bowel: Paraesophageal varices noted. Stomach is nondistended. No gastric wall thickening. No evidence of outlet  obstruction. Duodenum is normally positioned as is the ligament of Treitz. No small bowel wall thickening. No small bowel dilatation. The terminal ileum is normal. Nonvisualization of the appendix is consistent with the reported history of appendectomy. No gross colonic mass. No colonic wall thickening. No substantial diverticular change. Vascular/Lymphatic: No abdominal aortic aneurysm. No abdominal aortic atherosclerotic calcification. Previously measured hepatoduodenal ligament lymph node is not evident on today's study. No retroperitoneal lymphadenopathy. No pelvic sidewall lymphadenopathy. Reproductive: Uterus surgically absent.  There is no adnexal mass. Other: Moderate volume ascites not substantially changed in the interval. Musculoskeletal: Bone windows reveal no worrisome lytic or sclerotic osseous lesions. IMPRESSION: 1. Stable exam.  No new or acute interval findings. 2. Cirrhotic changes in the liver as before with borderline splenomegaly and paraesophageal varices, features suggesting portal venous hypertension. 3. Cholelithiasis. 4. Moderate volume ascites, not substantially changed in the interval. Electronically Signed   By: Misty Stanley M.D.   On: 10/21/2015 23:28   I have personally reviewed and evaluated these images and lab results as part of my medical decision-making.   EKG Interpretation None      MDM   Final diagnoses:  Ascites  Generalized abdominal pain  Alcoholic cirrhosis of liver with ascites ([place])   50 year old female with history of alcoholics or assess who presents with increasing abdominal distention with abdominal pain. Afebrile, but tachycardic in the 110s on presentation. Normotensive. With extended abdomen and diffuse exquisite tenderness  to palpation. Bedside ultrasound with ascites, but no clear pocket that would be safe for bedside paracentesis to rule out SBP. CT performed and otherwise negative of the abdomen. Blood work notable for stable pancytopenia and progressive liver dysfunction. She does have elevated lactate of 4, but unclear if this is also related to her underlying liver disease. She has evidence of hyperglycemia as well, and AG 17 but lactate also playing a role and presentation not fully consistent with DKA,. She is in her clean covered with ceftriaxone for SBP pending IR guided paracentesis. She is discussed with Dr. Blaine Hamper who will admit patient for ongoing management.    Forde Dandy, MD 10/22/15 [phone]

## 2015-10-21 NOTE — ED Notes (Signed)
Per EMS- pt c/o Abd pain and distention for 1 week.Tachy at 120.  Hx of cirrhosis, ascites.

## 2015-10-21 NOTE — ED Notes (Signed)
Pt states that her pain is no better and she is requesting dilaudid for pain.

## 2015-10-21 NOTE — ED Notes (Signed)
Informed Dr. Oleta Mouse of lactic acid of 4.24 @ 2339

## 2015-10-22 ENCOUNTER — Inpatient Hospital Stay (HOSPITAL_COMMUNITY): Payer: Medicaid Other

## 2015-10-22 DIAGNOSIS — F1020 Alcohol dependence, uncomplicated: Secondary | ICD-10-CM | POA: Diagnosis present

## 2015-10-22 DIAGNOSIS — K652 Spontaneous bacterial peritonitis: Secondary | ICD-10-CM | POA: Diagnosis present

## 2015-10-22 DIAGNOSIS — K3189 Other diseases of stomach and duodenum: Secondary | ICD-10-CM | POA: Diagnosis present

## 2015-10-22 DIAGNOSIS — K7030 Alcoholic cirrhosis of liver without ascites: Secondary | ICD-10-CM | POA: Diagnosis not present

## 2015-10-22 DIAGNOSIS — E43 Unspecified severe protein-calorie malnutrition: Secondary | ICD-10-CM | POA: Diagnosis present

## 2015-10-22 DIAGNOSIS — K5909 Other constipation: Secondary | ICD-10-CM | POA: Diagnosis present

## 2015-10-22 DIAGNOSIS — R188 Other ascites: Secondary | ICD-10-CM | POA: Diagnosis not present

## 2015-10-22 DIAGNOSIS — K219 Gastro-esophageal reflux disease without esophagitis: Secondary | ICD-10-CM

## 2015-10-22 DIAGNOSIS — D731 Hypersplenism: Secondary | ICD-10-CM | POA: Diagnosis present

## 2015-10-22 DIAGNOSIS — D61818 Other pancytopenia: Secondary | ICD-10-CM | POA: Diagnosis present

## 2015-10-22 DIAGNOSIS — F10239 Alcohol dependence with withdrawal, unspecified: Secondary | ICD-10-CM | POA: Diagnosis present

## 2015-10-22 DIAGNOSIS — Z681 Body mass index (BMI) 19 or less, adult: Secondary | ICD-10-CM | POA: Diagnosis not present

## 2015-10-22 DIAGNOSIS — IMO0002 Reserved for concepts with insufficient information to code with codable children: Secondary | ICD-10-CM | POA: Diagnosis present

## 2015-10-22 DIAGNOSIS — E1310 Other specified diabetes mellitus with ketoacidosis without coma: Secondary | ICD-10-CM | POA: Diagnosis present

## 2015-10-22 DIAGNOSIS — K8020 Calculus of gallbladder without cholecystitis without obstruction: Secondary | ICD-10-CM | POA: Diagnosis present

## 2015-10-22 DIAGNOSIS — F419 Anxiety disorder, unspecified: Secondary | ICD-10-CM | POA: Diagnosis present

## 2015-10-22 DIAGNOSIS — A419 Sepsis, unspecified organism: Secondary | ICD-10-CM | POA: Diagnosis present

## 2015-10-22 DIAGNOSIS — K8681 Exocrine pancreatic insufficiency: Secondary | ICD-10-CM | POA: Diagnosis present

## 2015-10-22 DIAGNOSIS — K766 Portal hypertension: Secondary | ICD-10-CM | POA: Diagnosis present

## 2015-10-22 DIAGNOSIS — E871 Hypo-osmolality and hyponatremia: Secondary | ICD-10-CM

## 2015-10-22 DIAGNOSIS — R1084 Generalized abdominal pain: Secondary | ICD-10-CM | POA: Diagnosis present

## 2015-10-22 DIAGNOSIS — K861 Other chronic pancreatitis: Secondary | ICD-10-CM | POA: Diagnosis present

## 2015-10-22 DIAGNOSIS — G8929 Other chronic pain: Secondary | ICD-10-CM | POA: Diagnosis present

## 2015-10-22 DIAGNOSIS — B1920 Unspecified viral hepatitis C without hepatic coma: Secondary | ICD-10-CM | POA: Diagnosis present

## 2015-10-22 DIAGNOSIS — E1165 Type 2 diabetes mellitus with hyperglycemia: Secondary | ICD-10-CM | POA: Diagnosis not present

## 2015-10-22 DIAGNOSIS — F329 Major depressive disorder, single episode, unspecified: Secondary | ICD-10-CM | POA: Diagnosis present

## 2015-10-22 DIAGNOSIS — K7031 Alcoholic cirrhosis of liver with ascites: Secondary | ICD-10-CM | POA: Diagnosis present

## 2015-10-22 DIAGNOSIS — I8500 Esophageal varices without bleeding: Secondary | ICD-10-CM | POA: Diagnosis present

## 2015-10-22 DIAGNOSIS — F1029 Alcohol dependence with unspecified alcohol-induced disorder: Secondary | ICD-10-CM | POA: Diagnosis not present

## 2015-10-22 DIAGNOSIS — F1410 Cocaine abuse, uncomplicated: Secondary | ICD-10-CM | POA: Diagnosis present

## 2015-10-22 LAB — GLUCOSE, CAPILLARY
GLUCOSE-CAPILLARY: 460 mg/dL — AB (ref 65–99)
GLUCOSE-CAPILLARY: 226 mg/dL — AB (ref 65–99)
GLUCOSE-CAPILLARY: 144 mg/dL — AB (ref 65–99)
GLUCOSE-CAPILLARY: 257 mg/dL — AB (ref 65–99)
Glucose-Capillary: 451 mg/dL — ABNORMAL HIGH (ref 65–99)
Glucose-Capillary: 360 mg/dL — ABNORMAL HIGH (ref 65–99)
Glucose-Capillary: 271 mg/dL — ABNORMAL HIGH (ref 65–99)
Glucose-Capillary: 255 mg/dL — ABNORMAL HIGH (ref 65–99)
Glucose-Capillary: 278 mg/dL — ABNORMAL HIGH (ref 65–99)
Glucose-Capillary: 221 mg/dL — ABNORMAL HIGH (ref 65–99)
Glucose-Capillary: 349 mg/dL — ABNORMAL HIGH (ref 65–99)

## 2015-10-22 LAB — BASIC METABOLIC PANEL
ANION GAP: 19 — AB (ref 5–15)
ANION GAP: 11 (ref 5–15)
ANION GAP: 9 (ref 5–15)
ANION GAP: 8 (ref 5–15)
BUN: 5 mg/dL — AB (ref 6–20)
BUN: <5 mg/dL — ABNORMAL LOW (ref 6–20)
BUN: <5 mg/dL — ABNORMAL LOW (ref 6–20)
CALCIUM: 8.2 mg/dL — AB (ref 8.9–10.3)
CALCIUM: 8.4 mg/dL — AB (ref 8.9–10.3)
CHLORIDE: 85 mmol/L — AB (ref 101–111)
CO2: 22 mmol/L (ref 22–32)
CO2: 30 mmol/L (ref 22–32)
CO2: 29 mmol/L (ref 22–32)
CO2: 31 mmol/L (ref 22–32)
Calcium: 7.5 mg/dL — ABNORMAL LOW (ref 8.9–10.3)
Calcium: 7.9 mg/dL — ABNORMAL LOW (ref 8.9–10.3)
Chloride: 91 mmol/L — ABNORMAL LOW (ref 101–111)
Chloride: 93 mmol/L — ABNORMAL LOW (ref 101–111)
Chloride: 92 mmol/L — ABNORMAL LOW (ref 101–111)
Creatinine, Ser: 0.58 mg/dL (ref 0.44–1.00)
Creatinine, Ser: 0.48 mg/dL (ref 0.44–1.00)
Creatinine, Ser: 0.31 mg/dL — ABNORMAL LOW (ref 0.44–1.00)
Creatinine, Ser: 0.35 mg/dL — ABNORMAL LOW (ref 0.44–1.00)
GFR calc Af Amer: >60 mL/min (ref >60)
GFR calc Af Amer: >60 mL/min (ref >60)
GFR calc Af Amer: >60 mL/min (ref >60)
GFR calc Af Amer: >60 mL/min (ref >60)
GLUCOSE: 460 mg/dL — AB (ref 65–99)
Glucose, Bld: 220 mg/dL — ABNORMAL HIGH (ref 65–99)
Glucose, Bld: 113 mg/dL — ABNORMAL HIGH (ref 65–99)
Glucose, Bld: 301 mg/dL — ABNORMAL HIGH (ref 65–99)
POTASSIUM: 2.6 mmol/L — AB (ref 3.5–5.1)
POTASSIUM: 2.7 mmol/L — AB (ref 3.5–5.1)
POTASSIUM: 3.5 mmol/L (ref 3.5–5.1)
POTASSIUM: 3.9 mmol/L (ref 3.5–5.1)
SODIUM: 132 mmol/L — AB (ref 135–145)
SODIUM: 131 mmol/L — AB (ref 135–145)
SODIUM: 131 mmol/L — AB (ref 135–145)
Sodium: 126 mmol/L — ABNORMAL LOW (ref 135–145)

## 2015-10-22 LAB — CBC
HCT: 31.3 % — ABNORMAL LOW (ref 36.0–46.0)
Hemoglobin: 10.2 g/dL — ABNORMAL LOW (ref 12.0–15.0)
MCH: 30.2 pg (ref 26.0–34.0)
MCHC: 32.6 g/dL (ref 30.0–36.0)
MCV: 92.6 fL (ref 78.0–100.0)
PLATELETS: 94 10*3/uL — AB (ref 150–400)
RBC: 3.38 MIL/uL — AB (ref 3.87–5.11)
RDW: 19.3 % — ABNORMAL HIGH (ref 11.5–15.5)
WBC: 3.1 10*3/uL — AB (ref 4.0–10.5)

## 2015-10-22 LAB — BODY FLUID CELL COUNT WITH DIFFERENTIAL
Lymphs, Fluid: 34 %
Monocyte-Macrophage-Serous Fluid: 66 % (ref 50–90)
Total Nucleated Cell Count, Fluid: 194 cu mm (ref 0–1000)

## 2015-10-22 LAB — RAPID URINE DRUG SCREEN, HOSP PERFORMED
AMPHETAMINES: NOT DETECTED
BENZODIAZEPINES: POSITIVE — AB
Barbiturates: NOT DETECTED
Cocaine: NOT DETECTED
OPIATES: POSITIVE — AB
Tetrahydrocannabinol: NOT DETECTED

## 2015-10-22 LAB — LACTATE DEHYDROGENASE, PLEURAL OR PERITONEAL FLUID: LD FL: 39 U/L — AB (ref 3–23)

## 2015-10-22 LAB — PROCALCITONIN: Procalcitonin: 0.19 ng/mL

## 2015-10-22 LAB — SODIUM, URINE, RANDOM

## 2015-10-22 LAB — LACTIC ACID, PLASMA
LACTIC ACID, VENOUS: 4.6 mmol/L — AB (ref 0.5–1.9)
LACTIC ACID, VENOUS: 2.6 mmol/L — AB (ref 0.5–1.9)
LACTIC ACID, VENOUS: 2.6 mmol/L — AB (ref 0.5–1.9)

## 2015-10-22 LAB — GRAM STAIN

## 2015-10-22 LAB — PROTEIN, BODY FLUID

## 2015-10-22 LAB — MAGNESIUM: MAGNESIUM: 1.4 mg/dL — AB (ref 1.7–2.4)

## 2015-10-22 LAB — MRSA PCR SCREENING: MRSA BY PCR: NEGATIVE

## 2015-10-22 LAB — OSMOLALITY: OSMOLALITY: 280 mosm/kg (ref 275–295)

## 2015-10-22 LAB — OSMOLALITY, URINE: Osmolality, Ur: 429 mOsm/kg (ref 300–900)

## 2015-10-22 MED ORDER — LORAZEPAM 2 MG/ML IJ SOLN
0.0000 mg | Freq: Two times a day (BID) | INTRAMUSCULAR | Status: DC
  Administered 2015-10-25: 0 mg via INTRAVENOUS

## 2015-10-22 MED ORDER — LORAZEPAM 1 MG PO TABS: 1.0000 mg | ORAL_TABLET | Freq: Four times a day (QID) | ORAL | Status: DC | PRN

## 2015-10-22 MED ORDER — ALBUMIN HUMAN 25 % IV SOLN
25.0000 g | Freq: Once | INTRAVENOUS | Status: AC
  Administered 2015-10-22: 25 g via INTRAVENOUS
  Filled 2015-10-22: qty 50

## 2015-10-22 MED ORDER — INSULIN ASPART 100 UNIT/ML ~~LOC~~ SOLN
3.0000 [IU] | Freq: Three times a day (TID) | SUBCUTANEOUS | Status: DC
  Administered 2015-10-22 (×3): 3 [IU] via SUBCUTANEOUS

## 2015-10-22 MED ORDER — SODIUM CHLORIDE 0.9 % IV BOLUS (SEPSIS)
500.0000 mL | Freq: Once | INTRAVENOUS | Status: AC
  Administered 2015-10-22: 500 mL via INTRAVENOUS

## 2015-10-22 MED ORDER — DEXTROSE 5 % IV SOLN
2.0000 g | INTRAVENOUS | Status: DC
  Administered 2015-10-23 – 2015-10-25 (×3): 2 g via INTRAVENOUS
  Filled 2015-10-22 (×3): qty 2

## 2015-10-22 MED ORDER — RIFAXIMIN 550 MG PO TABS
550.0000 mg | ORAL_TABLET | Freq: Two times a day (BID) | ORAL | Status: DC
  Administered 2015-10-22 – 2015-10-27 (×11): 550 mg via ORAL
  Filled 2015-10-22 (×13): qty 1

## 2015-10-22 MED ORDER — LACTULOSE 10 GM/15ML PO SOLN
20.0000 g | Freq: Three times a day (TID) | ORAL | Status: DC
  Administered 2015-10-22 – 2015-10-26 (×13): 20 g via ORAL
  Administered 2015-10-26: 1 g via ORAL
  Administered 2015-10-27: 20 g via ORAL
  Filled 2015-10-22 (×15): qty 30

## 2015-10-22 MED ORDER — ONDANSETRON HCL 4 MG/2ML IJ SOLN
4.0000 mg | Freq: Three times a day (TID) | INTRAMUSCULAR | Status: DC | PRN
  Administered 2015-10-25: 4 mg via INTRAVENOUS
  Filled 2015-10-22: qty 2

## 2015-10-22 MED ORDER — MUPIROCIN 2 % EX OINT
TOPICAL_OINTMENT | CUTANEOUS | Status: AC
  Filled 2015-10-22: qty 22

## 2015-10-22 MED ORDER — SODIUM CHLORIDE 0.9 % IV SOLN
INTRAVENOUS | Status: DC
  Administered 2015-10-22 – 2015-10-23 (×4): via INTRAVENOUS

## 2015-10-22 MED ORDER — DEXTROSE 5 % IV SOLN: 1.0000 g | INTRAVENOUS | Status: DC

## 2015-10-22 MED ORDER — FOLIC ACID 1 MG PO TABS
1.0000 mg | ORAL_TABLET | Freq: Every day | ORAL | Status: DC
  Administered 2015-10-22 – 2015-10-27 (×6): 1 mg via ORAL
  Filled 2015-10-22 (×6): qty 1

## 2015-10-22 MED ORDER — ADULT MULTIVITAMIN W/MINERALS CH
1.0000 | ORAL_TABLET | Freq: Every day | ORAL | Status: DC
  Administered 2015-10-22 – 2015-10-27 (×6): 1 via ORAL
  Filled 2015-10-22 (×6): qty 1

## 2015-10-22 MED ORDER — POTASSIUM CHLORIDE CRYS ER 20 MEQ PO TBCR
40.0000 meq | EXTENDED_RELEASE_TABLET | Freq: Two times a day (BID) | ORAL | Status: AC
  Administered 2015-10-22 (×2): 40 meq via ORAL
  Filled 2015-10-22 (×2): qty 2

## 2015-10-22 MED ORDER — CEFTRIAXONE SODIUM 1 G IJ SOLR
1.0000 g | Freq: Once | INTRAMUSCULAR | Status: AC
  Administered 2015-10-22: 1 g via INTRAVENOUS
  Filled 2015-10-22: qty 10

## 2015-10-22 MED ORDER — INSULIN ASPART 100 UNIT/ML ~~LOC~~ SOLN
0.0000 [IU] | Freq: Three times a day (TID) | SUBCUTANEOUS | Status: DC
  Administered 2015-10-22: 1 [IU] via SUBCUTANEOUS
  Administered 2015-10-22: 3 [IU] via SUBCUTANEOUS
  Administered 2015-10-22: 9 [IU] via SUBCUTANEOUS
  Administered 2015-10-23 (×2): 2 [IU] via SUBCUTANEOUS
  Administered 2015-10-23: 5 [IU] via SUBCUTANEOUS
  Administered 2015-10-24: 3 [IU] via SUBCUTANEOUS
  Administered 2015-10-24: 2 [IU] via SUBCUTANEOUS
  Administered 2015-10-25: 1 [IU] via SUBCUTANEOUS
  Administered 2015-10-25: 9 [IU] via SUBCUTANEOUS
  Administered 2015-10-25: 1 [IU] via SUBCUTANEOUS
  Administered 2015-10-26: 3 [IU] via SUBCUTANEOUS
  Administered 2015-10-26: 2 [IU] via SUBCUTANEOUS
  Administered 2015-10-27: 3 [IU] via SUBCUTANEOUS
  Administered 2015-10-27: 9 [IU] via SUBCUTANEOUS

## 2015-10-22 MED ORDER — PANTOPRAZOLE SODIUM 40 MG PO TBEC
40.0000 mg | DELAYED_RELEASE_TABLET | Freq: Two times a day (BID) | ORAL | Status: DC
  Administered 2015-10-22 – 2015-10-27 (×11): 40 mg via ORAL
  Filled 2015-10-22 (×10): qty 1

## 2015-10-22 MED ORDER — SODIUM CHLORIDE 0.9% FLUSH
3.0000 mL | Freq: Two times a day (BID) | INTRAVENOUS | Status: DC
  Administered 2015-10-22 – 2015-10-26 (×11): 3 mL via INTRAVENOUS

## 2015-10-22 MED ORDER — OXYCODONE HCL 5 MG PO TABS
5.0000 mg | ORAL_TABLET | Freq: Four times a day (QID) | ORAL | Status: DC | PRN
  Administered 2015-10-22 – 2015-10-25 (×12): 5 mg via ORAL
  Filled 2015-10-22 (×12): qty 1

## 2015-10-22 MED ORDER — DIPHENHYDRAMINE HCL 50 MG/ML IJ SOLN
25.0000 mg | Freq: Once | INTRAMUSCULAR | Status: AC
  Administered 2015-10-22: 12.5 mg via INTRAVENOUS
  Filled 2015-10-22: qty 1

## 2015-10-22 MED ORDER — ALBUMIN HUMAN 25 % IV SOLN
100.0000 g | Freq: Once | INTRAVENOUS | Status: AC
  Administered 2015-10-22: 100 g via INTRAVENOUS
  Filled 2015-10-22: qty 400

## 2015-10-22 MED ORDER — SODIUM CHLORIDE 0.9 % IV SOLN: INTRAVENOUS | Status: DC

## 2015-10-22 MED ORDER — LORAZEPAM 2 MG/ML IJ SOLN
0.0000 mg | Freq: Four times a day (QID) | INTRAMUSCULAR | Status: AC
  Administered 2015-10-22 (×2): 1 mg via INTRAVENOUS
  Administered 2015-10-22: 2 mg via INTRAVENOUS
  Filled 2015-10-22 (×3): qty 1

## 2015-10-22 MED ORDER — DIPHENHYDRAMINE HCL 25 MG PO CAPS: 25.0000 mg | ORAL_CAPSULE | Freq: Once | ORAL | Status: DC

## 2015-10-22 MED ORDER — DOCUSATE SODIUM 100 MG PO CAPS
100.0000 mg | ORAL_CAPSULE | Freq: Two times a day (BID) | ORAL | Status: DC
  Administered 2015-10-22 – 2015-10-27 (×9): 100 mg via ORAL
  Filled 2015-10-22 (×10): qty 1

## 2015-10-22 MED ORDER — INSULIN ASPART 100 UNIT/ML ~~LOC~~ SOLN
0.0000 [IU] | Freq: Every day | SUBCUTANEOUS | Status: DC
  Administered 2015-10-22 – 2015-10-24 (×3): 3 [IU] via SUBCUTANEOUS
  Administered 2015-10-25: 2 [IU] via SUBCUTANEOUS

## 2015-10-22 MED ORDER — POTASSIUM CHLORIDE 10 MEQ/100ML IV SOLN
10.0000 meq | INTRAVENOUS | Status: AC
  Administered 2015-10-22 (×2): 10 meq via INTRAVENOUS
  Filled 2015-10-22 (×2): qty 100

## 2015-10-22 MED ORDER — DEXTROSE-NACL 5-0.45 % IV SOLN: INTRAVENOUS | Status: DC

## 2015-10-22 MED ORDER — VITAMIN B-1 100 MG PO TABS
100.0000 mg | ORAL_TABLET | Freq: Every day | ORAL | Status: DC
  Administered 2015-10-22 – 2015-10-27 (×6): 100 mg via ORAL
  Filled 2015-10-22 (×6): qty 1

## 2015-10-22 MED ORDER — INSULIN GLARGINE 100 UNIT/ML ~~LOC~~ SOLN
20.0000 [IU] | Freq: Two times a day (BID) | SUBCUTANEOUS | Status: DC
  Administered 2015-10-22 (×2): 20 [IU] via SUBCUTANEOUS
  Filled 2015-10-22 (×3): qty 0.2

## 2015-10-22 MED ORDER — ATENOLOL 25 MG PO TABS
12.5000 mg | ORAL_TABLET | Freq: Every day | ORAL | Status: DC
  Administered 2015-10-22 – 2015-10-23 (×2): 12.5 mg via ORAL
  Filled 2015-10-22 (×2): qty 1

## 2015-10-22 MED ORDER — LORAZEPAM 2 MG/ML IJ SOLN: 1.0000 mg | Freq: Four times a day (QID) | INTRAMUSCULAR | Status: DC | PRN

## 2015-10-22 MED ORDER — SERTRALINE HCL 50 MG PO TABS
150.0000 mg | ORAL_TABLET | Freq: Every day | ORAL | Status: DC
  Administered 2015-10-22 – 2015-10-27 (×6): 150 mg via ORAL
  Filled 2015-10-22: qty 3
  Filled 2015-10-22: qty 1
  Filled 2015-10-22 (×2): qty 3
  Filled 2015-10-22 (×2): qty 1

## 2015-10-22 MED ORDER — POTASSIUM CHLORIDE 10 MEQ/100ML IV SOLN
10.0000 meq | INTRAVENOUS | Status: AC
  Administered 2015-10-22 (×4): 10 meq via INTRAVENOUS
  Filled 2015-10-22 (×4): qty 100

## 2015-10-22 MED ORDER — POTASSIUM CHLORIDE 20 MEQ/15ML (10%) PO SOLN
40.0000 meq | Freq: Once | ORAL | Status: AC
  Administered 2015-10-22: 40 meq via ORAL
  Filled 2015-10-22: qty 30

## 2015-10-22 MED ORDER — DM-GUAIFENESIN ER 30-600 MG PO TB12
1.0000 | ORAL_TABLET | Freq: Two times a day (BID) | ORAL | Status: DC
  Administered 2015-10-22 – 2015-10-27 (×11): 1 via ORAL
  Filled 2015-10-22 (×11): qty 1

## 2015-10-22 MED ORDER — SODIUM CHLORIDE 0.9 % IV SOLN
INTRAVENOUS | Status: DC
  Administered 2015-10-22: 4 [IU]/h via INTRAVENOUS
  Filled 2015-10-22: qty 2.5

## 2015-10-22 MED ORDER — CALCIUM CARBONATE ANTACID 500 MG PO CHEW
3.0000 | CHEWABLE_TABLET | Freq: Four times a day (QID) | ORAL | Status: DC | PRN
  Filled 2015-10-22: qty 3

## 2015-10-22 MED ORDER — SODIUM CHLORIDE 0.9 % IV BOLUS (SEPSIS)
1000.0000 mL | Freq: Once | INTRAVENOUS | Status: AC
  Administered 2015-10-22: 1000 mL via INTRAVENOUS

## 2015-10-22 NOTE — H&P (Addendum)
History and Physical    [name] [id number] DOB: [birth year] DOA: 10/21/2015  Referring MD/NP/PA:   PCP: Cathlean Cower, MD   Patient coming from:  The patient is coming from home.  At baseline, pt is independent for most of ADL.  Chief Complaint: Abdominal pain  HPI: [name] is a 50 y.o. female with medical history significant of diabetes mellitus, HCV, alcohol abuse, liver cirrhosis with ascites, cocaine abuse, GERD, depression, anxiety, who presents with abdominal pain.  Patient reports that she has been having abdominal pain for about 1 week, which has been progressively getting worse. Her abdominal is very extended. She has nausea and vomited once yesterday. No diarrhea. Her abdominal pain is constant, diffuse, 8 out of 10 in severity, nonradiating. Patient has subjective fever and chills. She does not have symptoms of UTI, chest pain, shortness of breath, hematochezia, hematuria, hematemesis. No unilateral weakness.  ED Course: pt was found to have WBC 3.9, negative urinalysis for UTI but positive for ketone, INR 1.3, lactate 4.49-->4.24, lipase 71, temperature normal, tachycardia, no tachypnea, bicarbonate 18, blood sugar 473, anion gap 17, creatinine normal. CT of abdomen/pelvis showed cirrhotic changes in the liver as before with borderline splenomegaly and paraesophageal varices, features suggesting portal venous hypertension, cholelithiasis, moderate volume ascites, not substantially changed in the interval. EDP attempted paracentesis without success. Patient is admitted to stepdown for further urination treatment as inpatient.  Review of Systems:   General: has subjective fever evers, chills, no changes in body weight, has poor appetite, has fatigue HEENT: no blurry vision, hearing changes or sore throat Pulm: no dyspnea, coughing, wheezing CV: no chest pain, no palpitations Abd: Has nausea, vomiting, abdominal pain, no diarrhea, has constipation GU: no dysuria, burning on  urination, increased urinary frequency, hematuria  Ext: no leg edema Neuro: no unilateral weakness, numbness, or tingling, no vision change or hearing loss Skin: no rash MSK: No muscle spasm, no deformity, no limitation of range of movement in spin Heme: No easy bruising.  Travel history: No recent long distant travel.  Allergy:  Allergies  Allergen Reactions  . Betadine [Povidone Iodine] Hives    Past Medical History  Diagnosis Date  . Impaired glucose tolerance 11/07/2010  . Hepatitis C approx dx 2005    no tx to date  . Pancreatitis     HX of  . Alcohol dependency ([place])     at least [place] admissions 2007- 09/2013 for detox.   Marland Kitchen GERD (gastroesophageal reflux disease)   . Chronic constipation   . Gallstones   . Drug dependency ([place])     Hx of cocaine use  . Anxiety and depression 08/2011  . Rheumatoid factor positive   . Hemochromatosis     last phlebotomy tx ~ 2012. liver bx 2006  . Pancytopenia   . Abnormal vaginal bleeding     uterine fibroid  . Gallstone 2006  . Chronic abdominal pain 2011  . Cirrhosis ([place]) 11/09/2010  . Type II or unspecified type diabetes mellitus without mention of complication, uncontrolled 09/17/2011  . Major depression ([place]) 11/2012, 09/2013    [facility] admissions for this, suicide attempt by OD Ambien (2014), Prozac (2015),ETOHism   . Anxiety     Past Surgical History  Procedure Laterality Date  . Appendectomy    . Cesarean section  x 2  . Foot surgery  2013  . Abdominal hysterectomy  12/17/2011    Procedure: HYSTERECTOMY ABDOMINAL;  Surgeon: Gus Height, MD;  Location: [facility];  Service: Gynecology;  Laterality: N/A;  . Salpingoophorectomy  12/17/2011    Procedure: SALPINGO OOPHERECTOMY;  Surgeon: Gus Height, MD;  Location: [facility];  Service: Gynecology;  Laterality: Bilateral;  . Esophagogastroduodenoscopy N/A 12/10/2013    Procedure: ESOPHAGOGASTRODUODENOSCOPY (EGD);  Surgeon: Ladene Artist, MD;  Location: [facility];  Service: Endoscopy;   Laterality: N/A;  . Percutaneous liver biopsy  2011  . Colonoscopy N/A 02/18/2014    Procedure: COLONOSCOPY;  Surgeon: Gatha Mayer, MD;  Location: [facility];  Service: Endoscopy;  Laterality: N/A;  . Esophagogastroduodenoscopy (egd) with propofol N/A 08/08/2015    Procedure: ESOPHAGOGASTRODUODENOSCOPY (EGD) WITH PROPOFOL;  Surgeon: Gatha Mayer, MD;  Location: [facility];  Service: Endoscopy;  Laterality: N/A;    Social History:  reports that she has never smoked. She has never used smokeless tobacco. She reports that she drinks alcohol. She reports that she does not use illicit drugs.  Family History:  Family History  Problem Relation Age of Onset  . Cancer Mother     ovarian  . Diabetes Father      Prior to Admission medications   Medication Sig Start Date End Date Taking? Authorizing Provider  atenolol (TENORMIN) 25 MG tablet Take 0.5 tablets (12.5 mg total) by mouth daily. 08/10/15  Yes Barton Dubois, MD  calcium carbonate (TUMS EX) 750 MG chewable tablet Chew 3 tablets by mouth 4 (four) times daily as needed for heartburn.   Yes Historical Provider, MD  folic acid (FOLVITE) 1 MG tablet Take 1 tablet (1 mg total) by mouth daily. 05/27/15  Yes Lavina Hamman, MD  furosemide (LASIX) 40 MG tablet Take 2 tablets (80 mg total) by mouth daily. 08/24/15  Yes Gatha Mayer, MD  insulin aspart (NOVOLOG FLEXPEN) 100 UNIT/ML FlexPen Inject 4 Units into the skin 3 (three) times daily with meals.    Yes Historical Provider, MD  insulin glargine (LANTUS) 100 UNIT/ML injection INJECT 0.4 MLS (40 UNITS TOTAL) INTO THE SKIN AT BEDTIME. 08/24/15  Yes Gatha Mayer, MD  Insulin Pen Needle (BD PEN NEEDLE NANO U/F) 32G X 4 MM MISC Use as needed to inject insulin as prescribed 08/10/15  Yes Barton Dubois, MD  KLOR-CON M10 10 MEQ tablet Take 1 tablet (10 mEq total) by mouth daily. 08/10/15  Yes Barton Dubois, MD  lactulose (CHRONULAC) 10 GM/15ML solution Take 30 mLs (20 g total) by mouth 3 (three) times  daily. Hold for the day after you have 3 soft stool/day. 05/27/15  Yes Lavina Hamman, MD  metFORMIN (GLUCOPHAGE) 500 MG tablet TAKE 1 TABLET (500 MG TOTAL) BY MOUTH 2 (TWO) TIMES DAILY WITH A MEAL. 05/22/15  Yes Biagio Borg, MD  Multiple Vitamin (MULTIVITAMIN WITH MINERALS) TABS tablet Take 1 tablet by mouth daily.    Yes Historical Provider, MD  pantoprazole (PROTONIX) 40 MG tablet Take 1 tablet (40 mg total) by mouth 2 (two) times daily. 08/10/15  Yes Barton Dubois, MD  sertraline (ZOLOFT) 100 MG tablet Take 150 mg by mouth daily.   Yes Historical Provider, MD  spironolactone (ALDACTONE) 100 MG tablet Take 2 tablets (200 mg total) by mouth daily. 08/24/15  Yes Gatha Mayer, MD  thiamine 100 MG tablet Take 1 tablet (100 mg total) by mouth daily. 05/27/15  Yes Lavina Hamman, MD  diphenhydrAMINE (BENADRYL) 25 MG tablet Take 4 tablets (100 mg total) by mouth at bedtime as needed for sleep. Patient not taking: Reported on 10/21/2015 08/10/15   Barton Dubois, MD  nystatin cream (MYCOSTATIN)  Apply topically 3 (three) times daily. Apply to groin area and inner thighs Patient not taking: Reported on 10/21/2015 08/10/15   Barton Dubois, MD  oxyCODONE (OXY IR/ROXICODONE) 5 MG immediate release tablet Take 1 tablet (5 mg total) by mouth every 6 (six) hours as needed for severe pain. Patient not taking: Reported on 10/21/2015 08/24/15   Gatha Mayer, MD  rifaximin (XIFAXAN) 550 MG TABS tablet Take 1 tablet (550 mg total) by mouth 2 (two) times daily. Patient not taking: Reported on 10/21/2015 05/27/15   Lavina Hamman, MD    Physical Exam: Filed Vitals:   10/21/15 2300 10/21/15 2327 10/21/15 2330 10/22/15 0030  BP: 125/108 125/108 123/102 135/99  Pulse: 114 113 109   Temp:      Resp: 16 18 15 12   SpO2: 98% 98% 98%    General: Not in acute distress HEENT:       Eyes: PERRL, EOMI, no scleral icterus.       ENT: No discharge from the ears and nose, no pharynx injection, no tonsillar enlargement.        Neck: No  JVD, no bruit, no mass felt. Heme: No neck lymph node enlargement. Cardiac: S1/S2, RRR, No murmurs, No gallops or rubs. Pulm: No rales, wheezing, rhonchi or rubs. Abd: distended, diffused tenderness, no rebound pain, BS present. GU: No hematuria Ext: No pitting leg edema bilaterally. 2+DP/PT pulse bilaterally. Musculoskeletal: No joint deformities, No joint redness or warmth, no limitation of ROM in spin. Skin: No rashes.  Neuro: Alert, oriented X3, cranial nerves II-XII grossly intact, moves all extremities normally. Psych: Patient is not psychotic, no suicidal or hemocidal ideation.  Labs on Admission: I have personally reviewed following labs and imaging studies  CBC:  Recent Labs Lab 10/21/15 2022  WBC 3.9*  NEUTROABS 2.6  HGB 10.8*  HCT 33.4*  MCV 93.6  PLT [id number]*   Basic Metabolic Panel:  Recent Labs Lab 10/21/15 2022  NA 130*  K 3.8  CL 95*  CO2 18*  GLUCOSE 473*  BUN 8  CREATININE 0.51  CALCIUM 8.3*   GFR: CrCl cannot be calculated (Unknown ideal weight.). Liver Function Tests:  Recent Labs Lab 10/21/15 2022  AST 83*  ALT 58*  ALKPHOS 154*  BILITOT 1.6*  PROT 8.0  ALBUMIN 3.0*    Recent Labs Lab 10/21/15 2022  LIPASE 71*   No results for input(s): AMMONIA in the last 168 hours. Coagulation Profile:  Recent Labs Lab 10/21/15 2022  INR 1.30   Cardiac Enzymes: No results for input(s): CKTOTAL, CKMB, CKMBINDEX, TROPONINI in the last 168 hours. BNP (last 3 results) No results for input(s): PROBNP in the last 8760 hours. HbA1C: No results for input(s): HGBA1C in the last 72 hours. CBG:  Recent Labs Lab 10/22/15 0001  GLUCAP 409*   Lipid Profile: No results for input(s): CHOL, HDL, LDLCALC, TRIG, CHOLHDL, LDLDIRECT in the last 72 hours. Thyroid Function Tests: No results for input(s): TSH, T4TOTAL, FREET4, T3FREE, THYROIDAB in the last 72 hours. Anemia Panel: No results for input(s): VITAMINB12, FOLATE, FERRITIN, TIBC, IRON,  RETICCTPCT in the last 72 hours. Urine analysis:    Component Value Date/Time   COLORURINE YELLOW 10/21/2015 2041   APPEARANCEUR CLEAR 10/21/2015 2041   LABSPEC 1.041* 10/21/2015 2041   PHURINE 6.0 10/21/2015 2041   GLUCOSEU >1000* 10/21/2015 2041   GLUCOSEU >=1000* 07/18/2014 0943   HGBUR NEGATIVE 10/21/2015 2041   [place] NEGATIVE 10/21/2015 2041   BILIRUBINUR neg 05/19/2013 0855   Benjamin Stain  40* 10/21/2015 2041   PROTEINUR NEGATIVE 10/21/2015 2041   PROTEINUR neg 05/19/2013 0855   UROBILINOGEN 1.0 03/02/2015 1041   UROBILINOGEN 0.2 05/19/2013 0855   NITRITE NEGATIVE 10/21/2015 2041   NITRITE neg 05/19/2013 0855   LEUKOCYTESUR NEGATIVE 10/21/2015 2041   Sepsis Labs: @LABRCNTIP (procalcitonin:4,lacticidven:4) )No results found for this or any previous visit (from the past 240 hour(s)).   Radiological Exams on Admission: Ct Abdomen Pelvis W Contrast  10/21/2015  CLINICAL DATA:  Abdominal pain in a patient with a history of alcoholic cirrhosis, hepatitis-C and chronic pancreatitis. EXAM: CT ABDOMEN AND PELVIS WITH CONTRAST TECHNIQUE: Multidetector CT imaging of the abdomen and pelvis was performed using the standard protocol following bolus administration of intravenous contrast. CONTRAST:  17mL ISOVUE-300 IOPAMIDOL (ISOVUE-300) INJECTION 61% COMPARISON:  03/30/2015 FINDINGS: Lower chest:  Dependent atelectasis. Hepatobiliary: Markedly nodular liver contour compatible with reported history of cirrhosis. No evidence for enhancing lesion within the hepatic parenchyma 5 mm gallstone identified in the gallbladder. No intrahepatic or extrahepatic biliary dilation. Pancreas: No focal mass lesion. No dilatation of the main duct. No intraparenchymal cyst. No peripancreatic edema. Spleen: Spleen is 14 cm cranial caudal length, upper normal Adrenals/Urinary Tract: No adrenal nodule or mass. Kidneys are unremarkable. No evidence of hydroureter. The urinary bladder appears normal for the degree of  distention. Stomach/Bowel: Paraesophageal varices noted. Stomach is nondistended. No gastric wall thickening. No evidence of outlet obstruction. Duodenum is normally positioned as is the ligament of Treitz. No small bowel wall thickening. No small bowel dilatation. The terminal ileum is normal. Nonvisualization of the appendix is consistent with the reported history of appendectomy. No gross colonic mass. No colonic wall thickening. No substantial diverticular change. Vascular/Lymphatic: No abdominal aortic aneurysm. No abdominal aortic atherosclerotic calcification. Previously measured hepatoduodenal ligament lymph node is not evident on today's study. No retroperitoneal lymphadenopathy. No pelvic sidewall lymphadenopathy. Reproductive: Uterus surgically absent.  There is no adnexal mass. Other: Moderate volume ascites not substantially changed in the interval. Musculoskeletal: Bone windows reveal no worrisome lytic or sclerotic osseous lesions. IMPRESSION: 1. Stable exam.  No new or acute interval findings. 2. Cirrhotic changes in the liver as before with borderline splenomegaly and paraesophageal varices, features suggesting portal venous hypertension. 3. Cholelithiasis. 4. Moderate volume ascites, not substantially changed in the interval. Electronically Signed   By: Misty Stanley M.D.   On: 10/21/2015 23:28     EKG: Not done in ED, will get one.   Assessment/Plan Principal Problem:   Abdominal pain Active Problems:   Alcohol dependence (HCC)   Cocaine abuse   MDD (major depressive disorder) (HCC)   Ascites   Protein-calorie malnutrition, severe (HCC)   GERD (gastroesophageal reflux disease)   Hyponatremia   SBP (spontaneous bacterial peritonitis) ([place])   Sepsis (HCC)   Pancytopenia (HCC)   Diabetes mellitus type II, uncontrolled ([place])  Addendum:  The repeated BMP shows K=2.9; Na 130-->126, AG 17-->19, lactic acid 4.49-->4.24-->4.6. Pt is drowsy after received one dose of Ativan, but  carousable and is oriented x 3. Lung is clear to auscultation.  -will give KCl 40 mEq by oral in addition to 10 mEq x 4 by IV which is ordered by AP -will give NS bolus, 500 cc now -will check osmo of plasma and urine, and urine sodium level -neuro check q2h -continue NS at 75 cc/h  Abdominal pain: Most likely due to SBP. Lipse 71. CT of abdomen/pelvis showed cirrhosis, cholelithiasis, moderate volume ascites, but no other acute findings. EDP attempted paracentesis without success.  -  will admit to SDU as inpt -will start ceftriaxone IV -Will give IV albumin 100 g x 1 -prn oxycodone for pain -prn Zofran for nausea - IR, Dr. Alroy Dust was consulted for paracentesis in AM, they will do it today.   SBP:  -as above  Sepsis due to SBP: Patient is septic on admission with elevated lactate, tachycardia and WBC of 3.9. She is hemodynamically stable. Mental status is normal. -will get Procalcitonin and trend lactic acid levels per sepsis protocol. -IVF: 2L of NS bolus in ED, followed by 75 cc/h  -f/u Bx   ED Sepsis - Assessment   Performed at:    1:55 AM   Last Vitals:    Blood pressure 128/95, pulse 107, temperature 98.4 F (36.9 C), resp. rate 12, last menstrual period 11/01/2011, SpO2 98 %.  Heart:      Regular rhythm Tachchycarida  Lungs:     Clear to auscultation  Capillary Refill:   <2 seconds    Peripheral Pulse (include location): Brachial pulse palpable   Skin (include color):              Normal   Liver cirrhosis/alcohol abuse/HCV: pt continues drinking alcohol. She is not taking her lactulose and Rifaximin. States that she is taking Lasix and spironolactone. Mental status normal today. No signs of hepatic encephalopathy. -restart lactulose 20 g tid -On IV Rocephin now -hold Lasix and spironolactone due to sepsis. -continue home atenolo  Alcohol and cocaine abuse : -Did counseling about the importance of quitting alcohol and drug -CIWA protocol -check UDS -avoid  IV narcotics  Metabolic acidosis with elevated AG: Bicarbonate 18, anion gap 17. This is most likely due to elevated lactic acid level, but patient also has positive ketones in urine and elevated blood sugar, cannot completely rule out DKA. - start DKA protocol with BMP q4h - IVF: NS 75 cc/h; will switch to D5-1/2NS when CBG<250 - replete K as needed - Zofran prn nausea   DM-II: Last A1c 10.4 on 01/03/15, poorly controled. Patient is taking metformin and Lantus at home - on DKA protocol now -Check A1c  Protein-calorie malnutrition, severe ([place]): -Consult to nutrition  GERD: -Protonix  Hyponatremia: Na 130. Mental status normal. Likely due to the use of diuretics and poor oral intake -Hold her Lasix and spironolactone due to sepsis -On IV normal saline as above  Pancytopenia ([place]): WBC 3.9, hemoglobin 10.8, platelet 110. This is most likely due to liver cirrhosis. No bleeding tendency. -f/u by CBC  Depression: Stable, no suicidal or homicidal ideations. -Continue home medications: zoloft   DVT ppx: SCD Code Status: Full code Family Communication: None at bed side.   Disposition Plan:  Anticipate discharge back to previous home environment Consults called:  none Admission status: SDU/inpation       Date of Service 10/22/2015    Ivor Costa [facility] Pager [phone]  If 7PM-7AM, please contact night-coverage [email or web address] Password [email or web address] 10/22/2015, 1:16 AM

## 2015-10-22 NOTE — Procedures (Signed)
  Successful US guided paracentesis from right lateral abdomen.  Yielded 1.4 liters of clear yellow fluid.  No immediate complications.  Pt tolerated well.   Specimen was sent for labs.  Zoe Creasman S Gracy Ehly PA-C 10/22/2015 9:34 AM

## 2015-10-22 NOTE — Progress Notes (Signed)
[facility] PROGRESS NOTE    Progress Note  [name]  TSV:[id number] DOB: [birth year] DOA: 10/21/2015 PCP: Cathlean Cower, MD     Brief Narrative:   [name] is an 50 y.o. female past medical his significant for diabetes mellitus, itches V alcohol use liver cirrhosis with ascites who comes in with abdominal pain for 1 week prior to admission progressively getting worse.tory  Assessment/Plan:   Abdominal pain Possibly due to SBP (spontaneous bacterial peritonitis) ([place]): I agree with IV Rocephin. IR has already been consulted for possible paracentesis, INR less than 1.5, her lactic acidosis is improved with IV hydration, will continue. Give albumin after paracentesis Cont narcotics for abd pain, avoid NSAID's due to her history of GI bleed. Procalcitonin 0.19 blood cultures pending  Sepsis: See above for further details likely due to SBP she was started empirically on IV antibiotics heracidosis is improving with IV hydration. Blood cultures are pending. Repeated sepsis assessment completed.  Liver cirrhosis due to Alcohol dependence ([place]) & HCV: With ongoing alcohol abuse she was not taking her lactulose and rifaximin at home. She relates she was taking her Lasix and Aldactone. Resume lactulose and rifaximin Continue hold Lasix and Aldactone continue home dose of atenolol. Start CIWA protocol.  High anion gap metabolic acidosis: Likely due to lactic acidosis multifactorial due to DKA and lactic acidosis due to sepsis. We'll start her on long-acting insulin plus sliding scale insulin overlap with insulin drip for 2 hours then DC insulin drip. Continue CBGs every hours be met every 4.  Uncontrolled diabetes mellitus type 2: Last A1c was 10.4 we'll start her on long-acting insulin plus sliding-scale insulin.  Severe protein caloric malnutrition: Ensured 3 times a day when she is able to tolerate diet.  Hyponatremia: Urine lites are pending, continue hold her Lasix and  Aldactone. Continue check basic metabolic panel.  Pancytopenia: Chronic likely due to alcohol abuse.  Cocaine abuse Counseling.  MDD (major depressive disorder) ([place]) Cont zoloft   DVT prophylaxis: lovenox Family Communication:none Disposition Plan/Barrier to D/C: unable to determine Code Status:     Code Status Orders        Start     Ordered   10/22/15 0039  Full code   Continuous     10/22/15 0039    Code Status History    Date Active Date Inactive Code Status Order ID Comments User Context   08/07/2015 10:50 PM 08/10/2015  9:28 PM Full Code [id number]  Reubin Milan, MD Inpatient   05/24/2015 12:57 AM 05/27/2015  9:02 PM Full Code [id number]  Edwin Dada, MD Inpatient   03/29/2015  4:49 AM 04/03/2015  7:56 PM Full Code [id number]  Rise Patience, MD Inpatient   01/03/2015  4:53 AM 01/04/2015  9:53 PM Full Code [id number]  Merton Border, MD Inpatient   11/07/2014 12:13 PM 11/16/2014 12:20 PM Full Code [id number]  Debbe Odea, MD ED   07/20/2014  5:06 AM 07/26/2014  4:43 PM Full Code [id number]  Rise Patience, MD Inpatient   07/20/2014  2:55 AM 07/20/2014  5:06 AM Full Code [id number]  Rise Patience, MD ED   07/19/2014  1:45 PM 07/20/2014  2:55 AM Full Code [id number]  Clarene Reamer, MD Inpatient   07/19/2014  3:40 AM 07/19/2014  1:45 PM Full Code [id number]  Charlann Lange, PA-C ED   02/14/2014  6:36 AM 02/19/2014  9:05 PM Full Code [id number]  Ivor Costa, MD Inpatient   12/08/2013  7:42 PM 12/13/2013  7:54  PM Full Code [id number]  Velvet Bathe, MD Inpatient   09/23/2013  8:39 PM 09/28/2013  6:34 PM Full Code [id number]  Earleen Newport, NP Inpatient   09/23/2013 12:21 AM 09/23/2013  8:39 PM Full Code [id number]  Hoy Morn, MD ED   09/22/2013  9:07 PM 09/23/2013 12:21 AM Full Code [id number]  Mirna Mires, MD ED   12/16/2012  9:11 AM 12/17/2012 12:20 AM Full Code [id number]  Virgel Manifold, MD ED   11/23/2011  2:49 AM 11/23/2011  9:49 PM Full Code [id number]  Britta Mccreedy [place] Inpatient         IV Access:    Peripheral IV   Procedures and diagnostic studies:   Ct Abdomen Pelvis W Contrast  10/21/2015  CLINICAL DATA:  Abdominal pain in a patient with a history of alcoholic cirrhosis, hepatitis-C and chronic pancreatitis. EXAM: CT ABDOMEN AND PELVIS WITH CONTRAST TECHNIQUE: Multidetector CT imaging of the abdomen and pelvis was performed using the standard protocol following bolus administration of intravenous contrast. CONTRAST:  26m ISOVUE-300 IOPAMIDOL (ISOVUE-300) INJECTION 61% COMPARISON:  03/30/2015 FINDINGS: Lower chest:  Dependent atelectasis. Hepatobiliary: Markedly nodular liver contour compatible with reported history of cirrhosis. No evidence for enhancing lesion within the hepatic parenchyma 5 mm gallstone identified in the gallbladder. No intrahepatic or extrahepatic biliary dilation. Pancreas: No focal mass lesion. No dilatation of the main duct. No intraparenchymal cyst. No peripancreatic edema. Spleen: Spleen is 14 cm cranial caudal length, upper normal Adrenals/Urinary Tract: No adrenal nodule or mass. Kidneys are unremarkable. No evidence of hydroureter. The urinary bladder appears normal for the degree of distention. Stomach/Bowel: Paraesophageal varices noted. Stomach is nondistended. No gastric wall thickening. No evidence of outlet obstruction. Duodenum is normally positioned as is the ligament of Treitz. No small bowel wall thickening. No small bowel dilatation. The terminal ileum is normal. Nonvisualization of the appendix is consistent with the reported history of appendectomy. No gross colonic mass. No colonic wall thickening. No substantial diverticular change. Vascular/Lymphatic: No abdominal aortic aneurysm. No abdominal aortic atherosclerotic calcification. Previously measured hepatoduodenal ligament lymph node is not evident on today's study. No retroperitoneal lymphadenopathy. No pelvic sidewall lymphadenopathy. Reproductive: Uterus surgically absent.   There is no adnexal mass. Other: Moderate volume ascites not substantially changed in the interval. Musculoskeletal: Bone windows reveal no worrisome lytic or sclerotic osseous lesions. IMPRESSION: 1. Stable exam.  No new or acute interval findings. 2. Cirrhotic changes in the liver as before with borderline splenomegaly and paraesophageal varices, features suggesting portal venous hypertension. 3. Cholelithiasis. 4. Moderate volume ascites, not substantially changed in the interval. Electronically Signed   By: EMisty StanleyM.D.   On: 10/21/2015 23:28     Medical Consultants:    None.  Anti-Infectives:   Rocephin  Subjective:    DChere Babsonshe cont to have abd pain  Objective:    Filed Vitals:   10/22/15 0304 10/22/15 0400 10/22/15 0500 10/22/15 0600  BP:  113/75  90/61  Pulse: 113 110 117 114  Temp:      TempSrc:      Resp: _0 Height:      Weight:      SpO2: 98% 99% 96% 88%    Intake/Output Summary (Last 24 hours) at 10/22/15 0659 Last data filed at 10/22/15 0428  Gross per 24 hour  Intake    975 ml  Output      0 ml  Net    975 ml   FAutoliv  10/22/15 0100  Weight: 47.1 kg (103 lb 13.4 oz)    Exam: General exam: In no acute distress. Respiratory system: Good air movement and clear to auscultation. Cardiovascular system: S1 & S2 heard, RRR.  Gastrointestinal system: Abdomen is nondistended, soft and nontender.  Central nervous system: Alert and oriented. No focal neurological deficits. Extremities: No pedal edema. Skin: No rashes, lesions or ulcers Psychiatry: Judgement and insight appear normal. Mood & affect appropriate.    Data Reviewed:    Labs: Basic Metabolic Panel:  Recent Labs Lab 10/21/15 2022 10/22/15 0150 10/22/15 0527  NA 130* 126* 132*  K 3.8 2.6* 2.7*  CL 95* 85* 91*  CO2 18* 22 30  GLUCOSE 473* 460* 220*  BUN 8 5* <5*  CREATININE 0.51 0.58 0.48  CALCIUM 8.3* 7.5* 7.9*   GFR Estimated Creatinine Clearance:  62.6 mL/min (by C-G formula based on Cr of 0.48). Liver Function Tests:  Recent Labs Lab 10/21/15 2022  AST 83*  ALT 58*  ALKPHOS 154*  BILITOT 1.6*  PROT 8.0  ALBUMIN 3.0*    Recent Labs Lab 10/21/15 2022  LIPASE 71*   No results for input(s): AMMONIA in the last 168 hours. Coagulation profile  Recent Labs Lab 10/21/15 2022  INR 1.30    CBC:  Recent Labs Lab 10/21/15 2022 10/22/15 0527  WBC 3.9* 3.1*  NEUTROABS 2.6  --   HGB 10.8* 10.2*  HCT 33.4* 31.3*  MCV 93.6 92.6  PLT 110* 94*   Cardiac Enzymes: No results for input(s): CKTOTAL, CKMB, CKMBINDEX, TROPONINI in the last 168 hours. BNP (last 3 results) No results for input(s): PROBNP in the last 8760 hours. CBG:  Recent Labs Lab 10/22/15 0133 10/22/15 0233 10/22/15 0443 10/22/15 0536 10/22/15 0642  GLUCAP 460* 451* 271* 255* 278*   D-Dimer: No results for input(s): DDIMER in the last 72 hours. Hgb A1c: No results for input(s): HGBA1C in the last 72 hours. Lipid Profile: No results for input(s): CHOL, HDL, LDLCALC, TRIG, CHOLHDL, LDLDIRECT in the last 72 hours. Thyroid function studies: No results for input(s): TSH, T4TOTAL, T3FREE, THYROIDAB in the last 72 hours.  Invalid input(s): FREET3 Anemia work up: No results for input(s): VITAMINB12, FOLATE, FERRITIN, TIBC, IRON, RETICCTPCT in the last 72 hours. Sepsis Labs:  Recent Labs Lab 10/21/15 2022 10/21/15 2034 10/21/15 2337 10/22/15 0101 10/22/15 0150 10/22/15 0527  PROCALCITON  --   --   --  0.19  --   --   WBC 3.9*  --   --   --   --  3.1*  LATICACIDVEN  --  4.49* 4.24*  --  4.6* 2.6*   Microbiology Recent Results (from the past 240 hour(s))  MRSA PCR Screening     Status: None   Collection Time: 10/22/15  1:35 AM  Result Value Ref Range Status   MRSA by PCR NEGATIVE NEGATIVE Final    Comment:        The GeneXpert MRSA Assay (FDA approved for NASAL specimens only), is one component of a comprehensive MRSA  colonization surveillance program. It is not intended to diagnose MRSA infection nor to guide or monitor treatment for MRSA infections.      Medications:   . atenolol  12.5 mg Oral Daily  . [START ON 10/23/2015] cefTRIAXone (ROCEPHIN)  IV  2 g Intravenous Q24H  . dextromethorphan-guaiFENesin  1 tablet Oral BID  . docusate sodium  100 mg Oral BID  . folic acid  1 mg Oral Daily  . lactulose  20 g Oral TID  . lidocaine-EPINEPHrine      . LORazepam  0-4 mg Intravenous Q6H   Followed by  . [START ON 10/24/2015] LORazepam  0-4 mg Intravenous Q12H  . multivitamin with minerals  1 tablet Oral Daily  . pantoprazole  40 mg Oral BID  . potassium chloride  10 mEq Intravenous Q1 Hr x 4  . sertraline  150 mg Oral Daily  . sodium chloride flush  3 mL Intravenous Q12H  . thiamine  100 mg Oral Daily   Continuous Infusions: . sodium chloride 75 mL/hr at 10/22/15 0140  . dextrose 5 % and 0.45% NaCl Stopped (10/22/15 0147)  . insulin (NOVOLIN-R) infusion 8.7 Units/hr (10/22/15 0645)    Time spent: 25 min   LOS: 0 days   Charlynne Cousins  [facility] Pager [phone]  *Please refer to [email or web address], password [email or web address] to get updated schedule on who will round on this patient, as hospitalists switch teams weekly. If 7PM-7AM, please contact night-coverage at [email or web address], password [email or web address] for any overnight needs.  10/22/2015, 6:59 AM

## 2015-10-23 DIAGNOSIS — K7469 Other cirrhosis of liver: Secondary | ICD-10-CM

## 2015-10-23 LAB — COMPREHENSIVE METABOLIC PANEL
ALBUMIN: 3.6 g/dL (ref 3.5–5.0)
ALK PHOS: 132 U/L — AB (ref 38–126)
ALT: 60 U/L — ABNORMAL HIGH (ref 14–54)
ANION GAP: 7 (ref 5–15)
AST: 144 U/L — AB (ref 15–41)
BILIRUBIN TOTAL: 1.8 mg/dL — AB (ref 0.3–1.2)
CALCIUM: 8.6 mg/dL — AB (ref 8.9–10.3)
CO2: 25 mmol/L (ref 22–32)
CREATININE: 0.43 mg/dL — AB (ref 0.44–1.00)
Chloride: 98 mmol/L — ABNORMAL LOW (ref 101–111)
GFR calc Af Amer: >60 mL/min (ref >60)
GFR calc non Af Amer: >60 mL/min (ref >60)
GLUCOSE: 328 mg/dL — AB (ref 65–99)
Potassium: 4.5 mmol/L (ref 3.5–5.1)
Sodium: 130 mmol/L — ABNORMAL LOW (ref 135–145)
TOTAL PROTEIN: 7.4 g/dL (ref 6.5–8.1)

## 2015-10-23 LAB — GLUCOSE, CAPILLARY
GLUCOSE-CAPILLARY: 292 mg/dL — AB (ref 65–99)
Glucose-Capillary: 195 mg/dL — ABNORMAL HIGH (ref 65–99)
Glucose-Capillary: 291 mg/dL — ABNORMAL HIGH (ref 65–99)
Glucose-Capillary: 165 mg/dL — ABNORMAL HIGH (ref 65–99)

## 2015-10-23 LAB — LACTIC ACID, PLASMA
Lactic Acid, Venous: 2.7 mmol/L (ref 0.5–1.9)
Lactic Acid, Venous: 1.6 mmol/L (ref 0.5–1.9)

## 2015-10-23 LAB — CBC
HCT: 32.6 % — ABNORMAL LOW (ref 36.0–46.0)
Hemoglobin: 10.3 g/dL — ABNORMAL LOW (ref 12.0–15.0)
MCH: 29.5 pg (ref 26.0–34.0)
MCHC: 31.6 g/dL (ref 30.0–36.0)
MCV: 93.4 fL (ref 78.0–100.0)
PLATELETS: 91 10*3/uL — AB (ref 150–400)
RBC: 3.49 MIL/uL — ABNORMAL LOW (ref 3.87–5.11)
RDW: 19.8 % — AB (ref 11.5–15.5)
WBC: 3.1 10*3/uL — AB (ref 4.0–10.5)

## 2015-10-23 LAB — HEMOGLOBIN A1C
Hgb A1c MFr Bld: 12.8 % — ABNORMAL HIGH (ref 4.8–5.6)
Mean Plasma Glucose: 321 mg/dL

## 2015-10-23 MED ORDER — BOOST / RESOURCE BREEZE PO LIQD
1.0000 | Freq: Three times a day (TID) | ORAL | Status: DC
  Administered 2015-10-23 – 2015-10-27 (×10): 1 via ORAL

## 2015-10-23 MED ORDER — ALBUMIN HUMAN 25 % IV SOLN
50.0000 g | Freq: Once | INTRAVENOUS | Status: AC
  Administered 2015-10-24: 50 g via INTRAVENOUS
  Filled 2015-10-23: qty 50

## 2015-10-23 MED ORDER — INSULIN ASPART 100 UNIT/ML ~~LOC~~ SOLN
5.0000 [IU] | Freq: Three times a day (TID) | SUBCUTANEOUS | Status: DC
  Administered 2015-10-23 (×2): 5 [IU] via SUBCUTANEOUS

## 2015-10-23 MED ORDER — INSULIN GLARGINE 100 UNIT/ML ~~LOC~~ SOLN
25.0000 [IU] | Freq: Two times a day (BID) | SUBCUTANEOUS | Status: DC
  Administered 2015-10-23 (×2): 25 [IU] via SUBCUTANEOUS
  Filled 2015-10-23 (×3): qty 0.25

## 2015-10-23 NOTE — Evaluation (Signed)
Physical Therapy Evaluation Patient Details Name: [name] MRN: [id number] DOB: [birth year] Today's Date: 10/23/2015   History of Present Illness  50 yo female admitted with abd pain, sepsis. Hx of cirrhosis, DM, ETOH abuse, drug abuse  Clinical Impression  On eval, pt required Min assist for mobility-walked ~75 feet with RW. Unsteady. Dyspnea 2/4. O2 sat monitor not picking up so unable to accurately assess O2 sats during ambulation.     Follow Up Recommendations Supervision/Assistance - 24 hour    Equipment Recommendations  Rolling walker with 5" wheels (if pt agreeable to use)    Recommendations for Other Services       Precautions / Restrictions Precautions Precautions: Fall Restrictions Weight Bearing Restrictions: No      Mobility  Bed Mobility Overal bed mobility: Needs Assistance Bed Mobility: Supine to Sit     Supine to sit: Supervision;HOB elevated     General bed mobility comments: for safety  Transfers Overall transfer level: Needs assistance Equipment used: Rolling walker (2 wheeled) Transfers: Sit to/from Stand Sit to Stand: Min assist         General transfer comment: assist to stabilize.   Ambulation/Gait Ambulation/Gait assistance: Min assist Ambulation Distance (Feet): 75 Feet Assistive device: Rolling walker (2 wheeled) Gait Pattern/deviations: Step-through pattern;Decreased stride length     General Gait Details: assist to stabilize. dyspnea 2/4. seated rest break needed.   Stairs            Wheelchair Mobility    Modified Rankin (Stroke Patients Only)       Balance Overall balance assessment: Needs assistance         Standing balance support: Bilateral upper extremity supported;During functional activity Standing balance-Leahy Scale: Poor                               Pertinent Vitals/Pain Pain Assessment: Faces Faces Pain Scale: Hurts little more Pain Location: abd pain Pain Descriptors /  Indicators: Sore Pain Intervention(s): Monitored during session    Home Living Family/patient expects to be discharged to:: Private residence Living Arrangements: Spouse/significant other   Type of Home: House Home Access: Stairs to enter Entrance Stairs-Rails: Right Entrance Stairs-Number of Steps: 4 Home Layout: One level Home Equipment: None      Prior Function Level of Independence: Independent         Comments: "furniture walks"     Hand Dominance        Extremity/Trunk Assessment   Upper Extremity Assessment: Generalized weakness           Lower Extremity Assessment: Generalized weakness      Cervical / Trunk Assessment: Normal  Communication   Communication: No difficulties  Cognition Arousal/Alertness: Awake/alert Behavior During Therapy: WFL for tasks assessed/performed Overall Cognitive Status: Within Functional Limits for tasks assessed                      General Comments      Exercises        Assessment/Plan    PT Assessment Patient needs continued PT services  PT Diagnosis Difficulty walking;Generalized weakness   PT Problem List Decreased mobility  PT Treatment Interventions DME instruction;Gait training;Functional mobility training;Therapeutic activities;Patient/family education;Balance training;Therapeutic exercise   PT Goals (Current goals can be found in the Care Plan section) Acute Rehab PT Goals Patient Stated Goal: return to baseline PT Goal Formulation: With patient Time For Goal Achievement: 11/06/15 Potential to Achieve Goals:  Good    Frequency Min 3X/week   Barriers to discharge        Co-evaluation               End of Session   Activity Tolerance: Patient limited by fatigue Patient left: in bed;with call bell/phone within reach;with bed alarm set           Time: [id number] PT Time Calculation (min) (ACUTE ONLY): 17 min   Charges:   PT Evaluation $PT Eval Low Complexity: 1 Procedure      PT G Codes:        Weston Anna, MPT Pager: [phone]

## 2015-10-23 NOTE — Progress Notes (Signed)
Inpatient Diabetes Program Recommendations  AACE/ADA: New Consensus Statement on Inpatient Glycemic Control (2015)  Target Ranges:  Prepandial:   less than 140 mg/dL      Peak postprandial:   less than 180 mg/dL (1-2 hours)      Critically ill patients:  140 - 180 mg/dL   Results for ALITHIA, ZAVALETA (MRN [id number]) as of 10/23/2015 08:53  Ref. Range 10/21/2015 20:22  Sodium Latest Ref Range: 135-145 mmol/L 130 (L)  Potassium Latest Ref Range: 3.5-5.1 mmol/L 3.8  Chloride Latest Ref Range: 101-111 mmol/L 95 (L)  CO2 Latest Ref Range: 22-32 mmol/L 18 (L)  BUN Latest Ref Range: 6-20 mg/dL 8  Creatinine Latest Ref Range: 0.44-1.00 mg/dL 0.51  Calcium Latest Ref Range: 8.9-10.3 mg/dL 8.3 (L)  EGFR (Non-African Amer.) Latest Ref Range: >60 mL/min >60  EGFR (African American) Latest Ref Range: >60 mL/min >60  Glucose Latest Ref Range: 65-99 mg/dL 473 (H)  Anion gap Latest Ref Range: 5-15  17 (H)   Results for LYNDEN, CARRITHERS (MRN [id number]) as of 10/23/2015 08:53  Ref. Range 01/03/2015 06:40 10/22/2015 05:27  Hemoglobin A1C Latest Ref Range: 4.8-5.6 % 10.4 (H) 12.8 (H)    Admit with: SBP/ Sepsis/ DKA  History: DM, ETOH, Cirrhosis  Home DM Meds: Lantus 40 units QHS       Novolog 4 units tidwc       Metformin 500 mg bid  Current Insulin Orders: Lantus 25 units bid      Novolog Sensitive Correction Scale/ SSI (0-9 units) TID AC + HS      Novolog 5 units tidwc      -Note Novolog Meal Coverage increased to 5 units tidwc today.  -Also note Lantus increased to 25 units bid today.  -Current A1c of 12.8% shows very poor blood glucose control at home.   Spoke with patient about her current A1c of 12.8%.  Explained what an A1c is and what it measures.  Reminded patient that her goal A1c is 7% or less per ADA standards to prevent both acute and long-term complications.  Explained to patient the extreme importance of good glucose control at home.  Encouraged patient to check her CBGs at least tid at  homeand to record all CBGs in a logbook for her PCP to review.  Patient appeared very shaky and somewhat sleepy during our conversation.  Verified home insulins with patient.  Unsure why she is still taking Metformin at home if she is still actively drinking ETOH.  Patient told me she is not seeing her PCP Dr. Cathlean Cower any more b/c he does not take Medicaid.  Patient told me Dr. Carlean Purl is now her PCP.  Per patient, Dr. Carlean Purl recently instructed her to increase her Lantus insulin to 40 units QHS.  Unsure if this is correct since Dr. Carlean Purl is with [facility].    Patient likely needs further insulin adjustments for home.  No longer seeing Dr. Dwyane Dee with [facility].    MD- Please consider stopping patient's Metformin at time of d/c.       --Will follow patient during hospitalization--  Wyn Quaker RN, MSN, CDE Diabetes Coordinator Inpatient Glycemic Control Team Team Pager: [phone] (8a-5p)

## 2015-10-23 NOTE — Progress Notes (Signed)
Initial Nutrition Assessment  DOCUMENTATION CODES:   Severe malnutrition in context of chronic illness, Severe malnutrition in context of social or environmental circumstances  INTERVENTION:  - Will order Boost Breeze po TID, each supplement provides 250 kcal and 9 grams of protein - RD will continue to monitor for needs.  NUTRITION DIAGNOSIS:   Malnutrition related to chronic illness, social / environmental circumstances as evidenced by severe depletion of muscle mass, severe depletion of body fat.  GOAL:   Patient will meet greater than or equal to 90% of their needs  MONITOR:   PO intake, Supplement acceptance, Weight trends, Labs, I & O's  REASON FOR ASSESSMENT:   Consult Assessment of nutrition requirement/status  ASSESSMENT:   50 y.o. female with medical history significant of diabetes mellitus, HCV, alcohol abuse, liver cirrhosis with ascites, cocaine abuse, GERD, depression, anxiety, who presents with abdominal pain.  Pt seen for consult. BMI indicates normal weight. No intakes documented since admission. Pt eating lunch at time of RD visit (mashed potatoes, pot roast, salad, fruit salad) with only a few bites consumed at this time. Pt had paracentesis yesterday with 1.4 L removed and she reports she was informed that she is to have another paracentesis tonight. Pt states that ascites has been worse over the past few weeks than it has every been. Pt states that she and husband are concerned about current ascites and states that she wants her thyroid checked. She states that she has abdominal pressure, without severe or sharp pain, related to abdominal distention. She denies nausea. She denies chewing or swallowing issues, but she does state that for the past few weeks it has felt like something was constantly stuck in her lower esophagus and that this sensation worsens with PO intakes.   Pt denies issues with access to food at home. She does indicate that over the past 1 week  her appetite has decreased d/t items listed above and that since admission she has been trying to force herself to eat. She states that UBW is 160 lbs but she is unsure of the last time she weighed this and states CBW of 95 lbs. Per chart review, pt has remained mainly stable between 109-111 lbs since September 2016. Physical assessment to upper body shows severe muscle and fat wasting; lower body not assessed during this time as pt was eating.   Pt states that despite multiple hospitalizations in the past she has never tried oral nutrition supplements. Talked with pt about available supplements and she is interested in receiving Boost Breeze; will order TID and adjust as needed.  Not meeting needs now or PTA per available information. Medications reviewed. Labs reviewed; CBGs: 195 and 291 mg/dL today, creatinine low but trending up, Ca: 8.6 mg/dL, Na: 130 mmol/L, Cl: 95 mmol/L, LFTs elevated, BUN <5 mg/dL.   Diet Order:  Diet Heart Room service appropriate?: Yes; Fluid consistency:: Thin  Skin:  Reviewed, no issues  Last BM:  7/3  Height:   Ht Readings from Last 1 Encounters:  10/22/15 5\' 2"  (1.575 m)    Weight:   Wt Readings from Last 1 Encounters:  10/23/15 111 lb 1.8 oz (50.4 kg)    Ideal Body Weight:  50 kg (kg)  BMI:  Body mass index is 20.32 kg/(m^2).  Estimated Nutritional Needs:   Kcal:  1300-1500 (25-30 kcal/kg)  Protein:  60-70 grams  Fluid:  1.5 L/day  EDUCATION NEEDS:   No education needs identified at this time     Janett Billow  Ceasar Mons, MS, RD, LDN Inpatient Clinical Dietitian Pager # [phone] After hours/weekend pager # [phone]

## 2015-10-23 NOTE — Progress Notes (Addendum)
PROGRESS NOTE                                                                                                                                                                                                             Patient Demographics:    [name], is a 50 y.o. female, DOB - Sep 30, 1965, [id number]  Admit date - 10/21/2015   Admitting Physician Ivor Costa, MD  Outpatient Primary MD for the patient is Cathlean Cower, MD  LOS - 1  Outpatient Specialists: [facility]  Chief Complaint  Patient presents with  . Abdominal Pain  . Tachycardia       Brief Narrative  50 year old female with history of hep C/ alcoholic cirrhosis with decompensation with ongoing alcohol use, polysubstance abuse (active cocaine use), severe malnutrition, major depressive disorder and uncontrolled diabetes mellitus presented with one-week history of abdominal pain. She was found to be septic due to possible SBP and? DKA    Subjective:   Patient complains of abdominal pain. Had therapeutic paracentesis with 1.4 L year yellow fluid removed.   Assessment  & Plan :    Principal Problem: Sepsis ([place]) Suspected due to SBP. Peritoneal fluid does not confirm SBP. However given sepsis on presentation and ongoing abdominal pain will treat for SBP with empiric IV Rocephin. Patient has underlying pancytopenia from decompensated cirrhosis. She gets tachycardic with minimal exertion. Also suspect early alcohol withdrawal.   Active Problems:   Alcohol dependence ([place]) Counseled on cessation. Has early withdrawal symptoms. Monitor on CIWA. Continue thiamine, folate and multivitamin.  Decompensated alcoholic and hep C cirrhosis Has pancytopenia, ascites and esophageal varices. Was hospitalized in April for upper GI bleed. Has recurrent abdominal distention requiring paracentesis as outpatient. Still has abdominal distention. Will order for repeat  therapeutic paracentesis. Order IV albumin with paracentesis Continue lactulose and rifaximin.  Uncontrolled type 2 diabetes mellitus with mild DKA Had high anion gap metabolic acidosis associated with elevated lactic acid. Lantus dose increased to 25 units twice a day with pre-meal coverage.  Needs tight glycemic control.  Elevated lactic acid Persistent. Suspected due to sepsis however could also be contributed by liver failure with failed clearing. Monitor with IV fluids for now. Patient also on metformin which is held and will be discontinued upon discharge.  Pancytopenia ([place]) Secondary to decompensated liver disease    Cocaine  abuse Urine drug screen negative but reports active use. Counseled on cessation.    chronic hyponatremia         Code Status : Full code, prognosis is guarded  Family Communication  : None at bedside  Disposition Plan  : Home once symptoms improved. Continue step down monitoring  Barriers For Discharge : Active symptoms, alcohol withdrawal, sepsis  Consults  : None  Procedures  : Paracentesis on 7/2  DVT Prophylaxis  :  SCDs  Lab Results  Component Value Date   PLT 91* 10/23/2015    Antibiotics  :    Anti-infectives    Start     Dose/Rate Route Frequency Ordered Stop   10/23/15 0100  cefTRIAXone (ROCEPHIN) 2 g in dextrose 5 % 50 mL IVPB     2 g 100 mL/hr over 30 Minutes Intravenous Every 24 hours 10/22/15 0046     10/22/15 1000  rifaximin (XIFAXAN) tablet 550 mg     550 mg Oral 2 times daily 10/22/15 0709     10/22/15 0045  cefTRIAXone (ROCEPHIN) 1 g in dextrose 5 % 50 mL IVPB  Status:  Discontinued     1 g 100 mL/hr over 30 Minutes Intravenous Every 24 hours 10/22/15 0034 10/22/15 0046   10/22/15 0045  cefTRIAXone (ROCEPHIN) 1 g in dextrose 5 % 50 mL IVPB     1 g 100 mL/hr over 30 Minutes Intravenous  Once 10/22/15 0046 10/22/15 0127   10/22/15 0000  cefTRIAXone (ROCEPHIN) 1 g in dextrose 5 % 50 mL IVPB     1 g 100 mL/hr over  30 Minutes Intravenous  Once 10/21/15 2349 10/22/15 0046        Objective:   Filed Vitals:   10/23/15 0938 10/23/15 1000 10/23/15 1100 10/23/15 1200  BP: 122/83     Pulse: 116 115 102 103  Temp:      TempSrc:      Resp:  16 8   Height:      Weight:      SpO2:  98% 90%     Wt Readings from Last 3 Encounters:  10/23/15 50.4 kg (111 lb 1.8 oz)  08/24/15 49.102 kg (108 lb 4 oz)  08/07/15 54.432 kg (120 lb)     Intake/Output Summary (Last 24 hours) at 10/23/15 1357 Last data filed at 10/23/15 1100  Gross per 24 hour  Intake   1700 ml  Output   1150 ml  Net    550 ml     Physical Exam  Gen: not in distress, Appears fatigued, HEENT: no pallor, moist mucosa, supple neck Chest: clear b/l, no added sounds CVS: S1 and S2 tachycardic, no murmurs rub or gallop GI: soft, distended abdomen, tender, bowel sounds present Musculoskeletal: warm, no edema CNS: Alert and oriented, fine tremors    Data Review:    CBC  Recent Labs Lab 10/21/15 2022 10/22/15 0527 10/23/15 0848  WBC 3.9* 3.1* 3.1*  HGB 10.8* 10.2* 10.3*  HCT 33.4* 31.3* 32.6*  PLT 110* 94* 91*  MCV 93.6 92.6 93.4  MCH 30.3 30.2 29.5  MCHC 32.3 32.6 31.6  RDW 19.6* 19.3* 19.8*  LYMPHSABS 0.6*  --   --   MONOABS 0.5  --   --   EOSABS 0.1  --   --   BASOSABS 0.0  --   --     Chemistries   Recent Labs Lab 10/21/15 2022 10/22/15 0150 10/22/15 0525 10/22/15 0527 10/22/15 0940 10/22/15 1334 10/23/15 0848  NA 130* 126*  --  132* 131* 131* 130*  K 3.8 2.6*  --  2.7* 3.5 3.9 4.5  CL 95* 85*  --  91* 93* 92* 98*  CO2 18* 22  --  30 29 31 25   GLUCOSE 473* 460*  --  220* 113* 301* 328*  BUN 8 5*  --  <5* <5* <5* <5*  CREATININE 0.51 0.58  --  0.48 0.31* 0.35* 0.43*  CALCIUM 8.3* 7.5*  --  7.9* 8.2* 8.4* 8.6*  MG  --   --  1.4*  --   --   --   --   AST 83*  --   --   --   --   --  144*  ALT 58*  --   --   --   --   --  60*  ALKPHOS 154*  --   --   --   --   --  132*  BILITOT 1.6*  --   --   --    --   --  1.8*   ------------------------------------------------------------------------------------------------------------------ No results for input(s): CHOL, HDL, LDLCALC, TRIG, CHOLHDL, LDLDIRECT in the last 72 hours.  Lab Results  Component Value Date   HGBA1C 12.8* 10/22/2015   ------------------------------------------------------------------------------------------------------------------ No results for input(s): TSH, T4TOTAL, T3FREE, THYROIDAB in the last 72 hours.  Invalid input(s): FREET3 ------------------------------------------------------------------------------------------------------------------ No results for input(s): VITAMINB12, FOLATE, FERRITIN, TIBC, IRON, RETICCTPCT in the last 72 hours.  Coagulation profile  Recent Labs Lab 10/21/15 2022  INR 1.30    No results for input(s): DDIMER in the last 72 hours.  Cardiac Enzymes No results for input(s): CKMB, TROPONINI, MYOGLOBIN in the last 168 hours.  Invalid input(s): CK ------------------------------------------------------------------------------------------------------------------    Component Value Date/Time   BNP 21.8 05/23/2015 2139    Inpatient Medications  Scheduled Meds: . atenolol  12.5 mg Oral Daily  . cefTRIAXone (ROCEPHIN)  IV  2 g Intravenous Q24H  . dextromethorphan-guaiFENesin  1 tablet Oral BID  . docusate sodium  100 mg Oral BID  . feeding supplement  1 Container Oral TID BM  . folic acid  1 mg Oral Daily  . insulin aspart  0-5 Units Subcutaneous QHS  . insulin aspart  0-9 Units Subcutaneous TID WC  . insulin aspart  5 Units Subcutaneous TID WC  . insulin glargine  25 Units Subcutaneous BID  . lactulose  20 g Oral TID  . LORazepam  0-4 mg Intravenous Q6H   Followed by  . [START ON 10/24/2015] LORazepam  0-4 mg Intravenous Q12H  . multivitamin with minerals  1 tablet Oral Daily  . pantoprazole  40 mg Oral BID  . rifaximin  550 mg Oral BID  . sertraline  150 mg Oral Daily  .  sodium chloride flush  3 mL Intravenous Q12H  . thiamine  100 mg Oral Daily   Continuous Infusions: . sodium chloride 75 mL/hr at 10/23/15 1002   PRN Meds:.calcium carbonate, LORazepam **OR** LORazepam, ondansetron (ZOFRAN) IV, oxyCODONE  Micro Results Recent Results (from the past 240 hour(s))  MRSA PCR Screening     Status: None   Collection Time: 10/22/15  1:35 AM  Result Value Ref Range Status   MRSA by PCR NEGATIVE NEGATIVE Final    Comment:        The GeneXpert MRSA Assay (FDA approved for NASAL specimens only), is one component of a comprehensive MRSA colonization surveillance program. It is not intended to diagnose MRSA infection nor to guide  or monitor treatment for MRSA infections.   Culture, blood (x 2)     Status: None (Preliminary result)   Collection Time: 10/22/15  1:52 AM  Result Value Ref Range Status   Specimen Description BLOOD LEFT ARM  Final   Special Requests BOTTLES DRAWN AEROBIC AND ANAEROBIC 5CC  Final   Culture   Final    NO GROWTH 1 DAY Performed at [facility]    Report Status PENDING  Incomplete  Culture, blood (x 2)     Status: None (Preliminary result)   Collection Time: 10/22/15  1:58 AM  Result Value Ref Range Status   Specimen Description BLOOD LEFT ARM  Final   Special Requests BOTTLES DRAWN AEROBIC AND ANAEROBIC 10CC  Final   Culture   Final    NO GROWTH 1 DAY Performed at [facility]    Report Status PENDING  Incomplete  Culture, body fluid-bottle     Status: None (Preliminary result)   Collection Time: 10/22/15  9:30 AM  Result Value Ref Range Status   Specimen Description FLUID ABDOMEN  Final   Special Requests NONE  Final   Culture   Final    NO GROWTH < 24 HOURS Performed at [facility]    Report Status PENDING  Incomplete  Gram stain     Status: None   Collection Time: 10/22/15  9:30 AM  Result Value Ref Range Status   Specimen Description FLUID ABDOMEN  Final   Special Requests NONE  Final    Gram Stain   Final    MODERATE WBC PRESENT,BOTH PMN AND MONONUCLEAR NO ORGANISMS SEEN Performed at [facility]    Report Status 10/22/2015 FINAL  Final    Radiology Reports Ct Abdomen Pelvis W Contrast  10/21/2015  CLINICAL DATA:  Abdominal pain in a patient with a history of alcoholic cirrhosis, hepatitis-C and chronic pancreatitis. EXAM: CT ABDOMEN AND PELVIS WITH CONTRAST TECHNIQUE: Multidetector CT imaging of the abdomen and pelvis was performed using the standard protocol following bolus administration of intravenous contrast. CONTRAST:  19mL ISOVUE-300 IOPAMIDOL (ISOVUE-300) INJECTION 61% COMPARISON:  03/30/2015 FINDINGS: Lower chest:  Dependent atelectasis. Hepatobiliary: Markedly nodular liver contour compatible with reported history of cirrhosis. No evidence for enhancing lesion within the hepatic parenchyma 5 mm gallstone identified in the gallbladder. No intrahepatic or extrahepatic biliary dilation. Pancreas: No focal mass lesion. No dilatation of the main duct. No intraparenchymal cyst. No peripancreatic edema. Spleen: Spleen is 14 cm cranial caudal length, upper normal Adrenals/Urinary Tract: No adrenal nodule or mass. Kidneys are unremarkable. No evidence of hydroureter. The urinary bladder appears normal for the degree of distention. Stomach/Bowel: Paraesophageal varices noted. Stomach is nondistended. No gastric wall thickening. No evidence of outlet obstruction. Duodenum is normally positioned as is the ligament of Treitz. No small bowel wall thickening. No small bowel dilatation. The terminal ileum is normal. Nonvisualization of the appendix is consistent with the reported history of appendectomy. No gross colonic mass. No colonic wall thickening. No substantial diverticular change. Vascular/Lymphatic: No abdominal aortic aneurysm. No abdominal aortic atherosclerotic calcification. Previously measured hepatoduodenal ligament lymph node is not evident on today's study. No  retroperitoneal lymphadenopathy. No pelvic sidewall lymphadenopathy. Reproductive: Uterus surgically absent.  There is no adnexal mass. Other: Moderate volume ascites not substantially changed in the interval. Musculoskeletal: Bone windows reveal no worrisome lytic or sclerotic osseous lesions. IMPRESSION: 1. Stable exam.  No new or acute interval findings. 2. Cirrhotic changes in the liver as before with  borderline splenomegaly and paraesophageal varices, features suggesting portal venous hypertension. 3. Cholelithiasis. 4. Moderate volume ascites, not substantially changed in the interval. Electronically Signed   By: Misty Stanley M.D.   On: 10/21/2015 23:28   US Paracentesis  10/22/2015  INDICATION: Hepatitis-C. Cirrhosis. Recurrent ascites. Request for diagnostic and therapeutic paracentesis. EXAM: ULTRASOUND GUIDED RIGHT LATERAL ABDOMEN PARACENTESIS MEDICATIONS: 1% Lidocaine. COMPLICATIONS: None immediate. PROCEDURE: Informed written consent was obtained from the patient after a discussion of the risks, benefits and alternatives to treatment. A timeout was performed prior to the initiation of the procedure. Initial ultrasound scanning demonstrates a moderate amount of ascites within the right lower abdominal quadrant. The right lower abdomen was prepped and draped in the usual sterile fashion. 1% lidocaine with epinephrine was used for local anesthesia. Following this, a 19 gauge, 7-cm, Yueh catheter was introduced. An ultrasound image was saved for documentation purposes. The paracentesis was performed. The catheter was removed and a dressing was applied. The patient tolerated the procedure well without immediate post procedural complication. FINDINGS: A total of approximately 1.4 liters of clear yellow fluid was removed. Samples were sent to the laboratory as requested by the clinical team. IMPRESSION: Successful ultrasound-guided paracentesis yielding 1.4 liters of peritoneal fluid. Read by:  Gareth Eagle,  PA-C Electronically Signed   By: Lucrezia Europe M.D.   On: 10/22/2015 09:33    Time Spent in minutes  35   Louellen Molder M.D on 10/23/2015 at 1:57 PM  Between 7am to 7pm - Pager - [phone]  After 7pm go to [email or web address] - password [facility]  [facility] -  Office  [phone]

## 2015-10-24 ENCOUNTER — Inpatient Hospital Stay (HOSPITAL_COMMUNITY): Payer: Medicaid Other

## 2015-10-24 LAB — COMPREHENSIVE METABOLIC PANEL
ALT: 66 U/L — ABNORMAL HIGH (ref 14–54)
AST: 155 U/L — ABNORMAL HIGH (ref 15–41)
Albumin: 3.1 g/dL — ABNORMAL LOW (ref 3.5–5.0)
Alkaline Phosphatase: 135 U/L — ABNORMAL HIGH (ref 38–126)
Anion gap: 4 — ABNORMAL LOW (ref 5–15)
BUN: 6 mg/dL (ref 6–20)
CO2: 28 mmol/L (ref 22–32)
Calcium: 8.5 mg/dL — ABNORMAL LOW (ref 8.9–10.3)
Chloride: 102 mmol/L (ref 101–111)
Creatinine, Ser: 0.35 mg/dL — ABNORMAL LOW (ref 0.44–1.00)
GFR calc Af Amer: >60 mL/min (ref >60)
GFR calc non Af Amer: >60 mL/min (ref >60)
Glucose, Bld: 208 mg/dL — ABNORMAL HIGH (ref 65–99)
Potassium: 3.9 mmol/L (ref 3.5–5.1)
Sodium: 134 mmol/L — ABNORMAL LOW (ref 135–145)
Total Bilirubin: 1 mg/dL (ref 0.3–1.2)
Total Protein: 6.7 g/dL (ref 6.5–8.1)

## 2015-10-24 LAB — GLUCOSE, CAPILLARY
GLUCOSE-CAPILLARY: 242 mg/dL — AB (ref 65–99)
GLUCOSE-CAPILLARY: 92 mg/dL (ref 65–99)
Glucose-Capillary: 197 mg/dL — ABNORMAL HIGH (ref 65–99)
Glucose-Capillary: 300 mg/dL — ABNORMAL HIGH (ref 65–99)

## 2015-10-24 LAB — CBC
HCT: 31.5 % — ABNORMAL LOW (ref 36.0–46.0)
Hemoglobin: 9.9 g/dL — ABNORMAL LOW (ref 12.0–15.0)
MCH: 30.4 pg (ref 26.0–34.0)
MCHC: 31.4 g/dL (ref 30.0–36.0)
MCV: 96.6 fL (ref 78.0–100.0)
Platelets: 89 10*3/uL — ABNORMAL LOW (ref 150–400)
RBC: 3.26 MIL/uL — ABNORMAL LOW (ref 3.87–5.11)
RDW: 20.3 % — ABNORMAL HIGH (ref 11.5–15.5)
WBC: 3 10*3/uL — ABNORMAL LOW (ref 4.0–10.5)

## 2015-10-24 MED ORDER — FUROSEMIDE 40 MG PO TABS
80.0000 mg | ORAL_TABLET | Freq: Every day | ORAL | Status: DC
  Administered 2015-10-24 – 2015-10-25 (×2): 80 mg via ORAL
  Filled 2015-10-24 (×2): qty 2

## 2015-10-24 MED ORDER — SPIRONOLACTONE 25 MG PO TABS
200.0000 mg | ORAL_TABLET | Freq: Every day | ORAL | Status: DC
  Administered 2015-10-24 – 2015-10-27 (×4): 200 mg via ORAL
  Filled 2015-10-24 (×4): qty 8

## 2015-10-24 MED ORDER — ATENOLOL 25 MG PO TABS
25.0000 mg | ORAL_TABLET | Freq: Every day | ORAL | Status: DC
  Administered 2015-10-24 – 2015-10-27 (×4): 25 mg via ORAL
  Filled 2015-10-24 (×4): qty 1

## 2015-10-24 MED ORDER — HYOSCYAMINE SULFATE 0.125 MG SL SUBL
0.2500 mg | SUBLINGUAL_TABLET | Freq: Once | SUBLINGUAL | Status: AC
  Administered 2015-10-24: 0.25 mg via SUBLINGUAL
  Filled 2015-10-24: qty 2

## 2015-10-24 MED ORDER — INSULIN ASPART 100 UNIT/ML ~~LOC~~ SOLN
7.0000 [IU] | Freq: Three times a day (TID) | SUBCUTANEOUS | Status: DC
  Administered 2015-10-24 – 2015-10-25 (×4): 7 [IU] via SUBCUTANEOUS

## 2015-10-24 MED ORDER — INSULIN GLARGINE 100 UNIT/ML ~~LOC~~ SOLN
30.0000 [IU] | Freq: Two times a day (BID) | SUBCUTANEOUS | Status: DC
  Administered 2015-10-24 – 2015-10-27 (×7): 30 [IU] via SUBCUTANEOUS
  Filled 2015-10-24 (×9): qty 0.3

## 2015-10-24 MED ORDER — OXYCODONE HCL 5 MG PO TABS
5.0000 mg | ORAL_TABLET | Freq: Once | ORAL | Status: DC
  Filled 2015-10-24: qty 1

## 2015-10-24 NOTE — Procedures (Signed)
Ultrasound-guided therapeutic paracentesis performed yielding 1.9 liters of clear yellow colored fluid. No immediate complications.  [name] E 11:59 AM 10/24/2015

## 2015-10-24 NOTE — Progress Notes (Signed)
Pt complains of pain in abdomen, K. Schorr paged. Received order for levsin 0.25 sublingual one time and oxy 5 mg one time. Will continue to monitor.

## 2015-10-24 NOTE — Progress Notes (Signed)
Physical Therapy Treatment Patient Details Name: [name] MRN: [id number] DOB: 20-Jun-1965 Today's Date: 10/24/2015    History of Present Illness 50 yo female admitted with abd pain, sepsis. Hx of cirrhosis, DM, ETOH abuse, drug abuse    PT Comments    Assisted OOB to amb a greater distance.  Avg HR 112 and RA 92.  Mild c/o fatigue and SOB.  Assisted back to bed.   Follow Up Recommendations  Supervision/Assistance - 24 hour     Equipment Recommendations  Rolling walker with 5" wheels    Recommendations for Other Services       Precautions / Restrictions Precautions Precautions: Fall Restrictions Weight Bearing Restrictions: No    Mobility  Bed Mobility Overal bed mobility: Needs Assistance Bed Mobility: Supine to Sit;Sit to Supine     Supine to sit: Supervision Sit to supine: Supervision   General bed mobility comments: safety with lines/leads  Transfers Overall transfer level: Needs assistance Equipment used: Rolling walker (2 wheeled) Transfers: Sit to/from Stand Sit to Stand: Min guard         General transfer comment: assist to stabilize.   Ambulation/Gait Ambulation/Gait assistance: Min guard Ambulation Distance (Feet): 115 Feet Assistive device: Rolling walker (2 wheeled) Gait Pattern/deviations: Step-through pattern;Decreased stride length Gait velocity: decreased   General Gait Details: increased time with limited activity tolerance due to c/o fatigue and mild dyspnea   Stairs            Wheelchair Mobility    Modified Rankin (Stroke Patients Only)       Balance                                    Cognition Arousal/Alertness: Awake/alert Behavior During Therapy: WFL for tasks assessed/performed Overall Cognitive Status: Within Functional Limits for tasks assessed                      Exercises      General Comments        Pertinent Vitals/Pain Pain Assessment: Faces Faces Pain Scale: Hurts a  little bit Pain Location: ABD pain Pain Descriptors / Indicators: Sore Pain Intervention(s): Monitored during session;Repositioned    Home Living                      Prior Function            PT Goals (current goals can now be found in the care plan section) Progress towards PT goals: Progressing toward goals    Frequency  Min 3X/week    PT Plan Current plan remains appropriate    Co-evaluation             End of Session Equipment Utilized During Treatment: Gait belt Activity Tolerance: Patient limited by fatigue Patient left: in bed;with call bell/phone within reach;with bed alarm set     Time: 1004-1018 PT Time Calculation (min) (ACUTE ONLY): 14 min  Charges:  $Gait Training: 8-22 mins                    G Codes:      Rica Koyanagi  PTA [facility]  Acute  Rehab Pager      [phone]

## 2015-10-24 NOTE — Progress Notes (Signed)
Date:  October 24, 2015 Chart reviewed for concurrent status and case management needs. Will continue to follow the patient for changes and needs:  Abnormal wbc 3.0/paracentesis done [place], [place], [place], [place]

## 2015-10-24 NOTE — Plan of Care (Signed)
Problem: Pain Managment: Goal: General experience of comfort will improve Outcome: Not Progressing Pt states pain is 8/10 despite PRN medication.

## 2015-10-24 NOTE — Progress Notes (Signed)
PROGRESS NOTE                                                                                                                                                                                                             Patient Demographics:    [name], is a 50 y.o. female, DOB - 26-Jun-1965, [id number]  Admit date - 10/21/2015   Admitting Physician Ivor Costa, MD  Outpatient Primary MD for the patient is Cathlean Cower, MD  LOS - 2  Outpatient Specialists: [facility]  Chief Complaint  Patient presents with  . Abdominal Pain  . Tachycardia       Brief Narrative  50 year old female with history of hep C/ alcoholic cirrhosis with decompensation with ongoing alcohol use, polysubstance abuse (active cocaine use), severe malnutrition, major depressive disorder and uncontrolled diabetes mellitus presented with one-week history of abdominal pain. She was found to be septic due to possible SBP and? DKA    Subjective:   Still has abdominal discomfort. Repeat abdominal paracentesis done with 1.9 L clear yellow fluid removed.   Assessment  & Plan :    Principal Problem: Sepsis ([place]) Suspected due to SBP. Peritoneal fluid does not confirm SBP. However given sepsis on presentation and ongoing abdominal pain will treat for SBP with empiric IV Rocephin. Sepsis now resolved. Has tachycardia on minimal exertion.   Active Problems:  Decompensated alcoholic and hep C cirrhosis  Has pancytopenia, ascites and esophageal varices. Was hospitalized in April for upper GI bleed. Has recurrent abdominal distention requiring paracentesis as outpatient. Therapeutic paracentesis done on 7/2 and 7/4. (Removed 1.9 L fluid today). Received IV albumin with paracentesis. I have resumed her Lasix and Aldactone. Continue lactulose and rifaximin.    Alcohol dependence ([place]) Counseled on cessation. Has mild withdrawal symptoms with tremors and  tachycardia. Monitor on CIWA. Continue thiamine, folate and multivitamin.    Uncontrolled type 2 diabetes mellitus with mild DKA  high anion gap metabolic acidosis associated with elevated lactic acid. CBG still in high 200s. Increased Lantus dose further to 30 units twice a day and adjusted pre-meal aspart. A1c >12. Needs tight glycemic control.  Elevated lactic acid Possibly due to both sepsis and decompensated liver disease. Now resolved.  Pancytopenia ([place]) Secondary to decompensated liver disease    Cocaine abuse Urine drug screen negative but reports active use.  Counseled on cessation.    chronic hyponatremia         Code Status : Full code, prognosis is guarded  Family Communication  : None at bedside  Disposition Plan  : Home once symptoms improved. Transfer to telemetry. Possible discharge in the next 48 hours  Barriers For Discharge : Active symptoms, alcohol withdrawal, sepsis  Consults  : None  Procedures  : Paracentesis on 7/2 and 7/4.  DVT Prophylaxis  :  SCDs  Lab Results  Component Value Date   PLT 89* 10/24/2015    Antibiotics  :    Anti-infectives    Start     Dose/Rate Route Frequency Ordered Stop   10/23/15 0100  cefTRIAXone (ROCEPHIN) 2 g in dextrose 5 % 50 mL IVPB     2 g 100 mL/hr over 30 Minutes Intravenous Every 24 hours 10/22/15 0046     10/22/15 1000  rifaximin (XIFAXAN) tablet 550 mg     550 mg Oral 2 times daily 10/22/15 0709     10/22/15 0045  cefTRIAXone (ROCEPHIN) 1 g in dextrose 5 % 50 mL IVPB  Status:  Discontinued     1 g 100 mL/hr over 30 Minutes Intravenous Every 24 hours 10/22/15 0034 10/22/15 0046   10/22/15 0045  cefTRIAXone (ROCEPHIN) 1 g in dextrose 5 % 50 mL IVPB     1 g 100 mL/hr over 30 Minutes Intravenous  Once 10/22/15 0046 10/22/15 0127   10/22/15 0000  cefTRIAXone (ROCEPHIN) 1 g in dextrose 5 % 50 mL IVPB     1 g 100 mL/hr over 30 Minutes Intravenous  Once 10/21/15 2349 10/22/15 0046        Objective:    Filed Vitals:   10/24/15 1130 10/24/15 1140 10/24/15 1150 10/24/15 1241  BP: 100/74 97/77 104/71 109/82  Pulse:    105  Temp:      TempSrc:      Resp:      Height:      Weight:      SpO2:        Wt Readings from Last 3 Encounters:  10/24/15 52.2 kg (115 lb 1.3 oz)  08/24/15 49.102 kg (108 lb 4 oz)  08/07/15 54.432 kg (120 lb)     Intake/Output Summary (Last 24 hours) at 10/24/15 1316 Last data filed at 10/24/15 0600  Gross per 24 hour  Intake   1725 ml  Output    500 ml  Net   1225 ml     Physical Exam  Gen: not in distress, Appears fatigued, HEENT:  moist mucosa, supple neck Chest: clear b/l, no added sounds CVS: S1 and S2 tachycardic, no murmurs rub or gallop GI: soft, distended abdomen, tender, bowel sounds present Musculoskeletal: warm, no edema CNS: Alert and oriented, fine tremors    Data Review:    CBC  Recent Labs Lab 10/21/15 2022 10/22/15 0527 10/23/15 0848 10/24/15 0345  WBC 3.9* 3.1* 3.1* 3.0*  HGB 10.8* 10.2* 10.3* 9.9*  HCT 33.4* 31.3* 32.6* 31.5*  PLT 110* 94* 91* 89*  MCV 93.6 92.6 93.4 96.6  MCH 30.3 30.2 29.5 30.4  MCHC 32.3 32.6 31.6 31.4  RDW 19.6* 19.3* 19.8* 20.3*  LYMPHSABS 0.6*  --   --   --   MONOABS 0.5  --   --   --   EOSABS 0.1  --   --   --   BASOSABS 0.0  --   --   --  Chemistries   Recent Labs Lab 10/21/15 2022  10/22/15 0525 10/22/15 0527 10/22/15 0940 10/22/15 1334 10/23/15 0848 10/24/15 0345  NA 130*  < >  --  132* 131* 131* 130* 134*  K 3.8  < >  --  2.7* 3.5 3.9 4.5 3.9  CL 95*  < >  --  91* 93* 92* 98* 102  CO2 18*  < >  --  30 29 31 25 28   GLUCOSE 473*  < >  --  220* 113* 301* 328* 208*  BUN 8  < >  --  <5* <5* <5* <5* 6  CREATININE 0.51  < >  --  0.48 0.31* 0.35* 0.43* 0.35*  CALCIUM 8.3*  < >  --  7.9* 8.2* 8.4* 8.6* 8.5*  MG  --   --  1.4*  --   --   --   --   --   AST 83*  --   --   --   --   --  144* 155*  ALT 58*  --   --   --   --   --  60* 66*  ALKPHOS 154*  --   --   --   --    --  132* 135*  BILITOT 1.6*  --   --   --   --   --  1.8* 1.0  < > = values in this interval not displayed. ------------------------------------------------------------------------------------------------------------------ No results for input(s): CHOL, HDL, LDLCALC, TRIG, CHOLHDL, LDLDIRECT in the last 72 hours.  Lab Results  Component Value Date   HGBA1C 12.8* 10/22/2015   ------------------------------------------------------------------------------------------------------------------ No results for input(s): TSH, T4TOTAL, T3FREE, THYROIDAB in the last 72 hours.  Invalid input(s): FREET3 ------------------------------------------------------------------------------------------------------------------ No results for input(s): VITAMINB12, FOLATE, FERRITIN, TIBC, IRON, RETICCTPCT in the last 72 hours.  Coagulation profile  Recent Labs Lab 10/21/15 2022  INR 1.30    No results for input(s): DDIMER in the last 72 hours.  Cardiac Enzymes No results for input(s): CKMB, TROPONINI, MYOGLOBIN in the last 168 hours.  Invalid input(s): CK ------------------------------------------------------------------------------------------------------------------    Component Value Date/Time   BNP 21.8 05/23/2015 2139    Inpatient Medications  Scheduled Meds: . atenolol  25 mg Oral Daily  . cefTRIAXone (ROCEPHIN)  IV  2 g Intravenous Q24H  . dextromethorphan-guaiFENesin  1 tablet Oral BID  . docusate sodium  100 mg Oral BID  . feeding supplement  1 Container Oral TID BM  . folic acid  1 mg Oral Daily  . furosemide  80 mg Oral Daily  . insulin aspart  0-5 Units Subcutaneous QHS  . insulin aspart  0-9 Units Subcutaneous TID WC  . insulin aspart  7 Units Subcutaneous TID WC  . insulin glargine  30 Units Subcutaneous BID  . lactulose  20 g Oral TID  . LORazepam  0-4 mg Intravenous Q12H  . multivitamin with minerals  1 tablet Oral Daily  . pantoprazole  40 mg Oral BID  . rifaximin  550  mg Oral BID  . sertraline  150 mg Oral Daily  . sodium chloride flush  3 mL Intravenous Q12H  . spironolactone  200 mg Oral Daily  . thiamine  100 mg Oral Daily   Continuous Infusions:   PRN Meds:.calcium carbonate, LORazepam **OR** LORazepam, ondansetron (ZOFRAN) IV, oxyCODONE  Micro Results Recent Results (from the past 240 hour(s))  MRSA PCR Screening     Status: None   Collection Time: 10/22/15  1:35 AM  Result  Value Ref Range Status   MRSA by PCR NEGATIVE NEGATIVE Final    Comment:        The GeneXpert MRSA Assay (FDA approved for NASAL specimens only), is one component of a comprehensive MRSA colonization surveillance program. It is not intended to diagnose MRSA infection nor to guide or monitor treatment for MRSA infections.   Culture, blood (x 2)     Status: None (Preliminary result)   Collection Time: 10/22/15  1:52 AM  Result Value Ref Range Status   Specimen Description BLOOD LEFT ARM  Final   Special Requests BOTTLES DRAWN AEROBIC AND ANAEROBIC 5CC  Final   Culture   Final    NO GROWTH 2 DAYS Performed at [facility]    Report Status PENDING  Incomplete  Culture, blood (x 2)     Status: None (Preliminary result)   Collection Time: 10/22/15  1:58 AM  Result Value Ref Range Status   Specimen Description BLOOD LEFT ARM  Final   Special Requests BOTTLES DRAWN AEROBIC AND ANAEROBIC 10CC  Final   Culture   Final    NO GROWTH 2 DAYS Performed at [facility]    Report Status PENDING  Incomplete  Culture, body fluid-bottle     Status: None (Preliminary result)   Collection Time: 10/22/15  9:30 AM  Result Value Ref Range Status   Specimen Description FLUID ABDOMEN  Final   Special Requests NONE  Final   Culture   Final    NO GROWTH 2 DAYS Performed at [facility]    Report Status PENDING  Incomplete  Gram stain     Status: None   Collection Time: 10/22/15  9:30 AM  Result Value Ref Range Status   Specimen Description FLUID ABDOMEN   Final   Special Requests NONE  Final   Gram Stain   Final    MODERATE WBC PRESENT,BOTH PMN AND MONONUCLEAR NO ORGANISMS SEEN Performed at [facility]    Report Status 10/22/2015 FINAL  Final    Radiology Reports Ct Abdomen Pelvis W Contrast  10/21/2015  CLINICAL DATA:  Abdominal pain in a patient with a history of alcoholic cirrhosis, hepatitis-C and chronic pancreatitis. EXAM: CT ABDOMEN AND PELVIS WITH CONTRAST TECHNIQUE: Multidetector CT imaging of the abdomen and pelvis was performed using the standard protocol following bolus administration of intravenous contrast. CONTRAST:  24mL ISOVUE-300 IOPAMIDOL (ISOVUE-300) INJECTION 61% COMPARISON:  03/30/2015 FINDINGS: Lower chest:  Dependent atelectasis. Hepatobiliary: Markedly nodular liver contour compatible with reported history of cirrhosis. No evidence for enhancing lesion within the hepatic parenchyma 5 mm gallstone identified in the gallbladder. No intrahepatic or extrahepatic biliary dilation. Pancreas: No focal mass lesion. No dilatation of the main duct. No intraparenchymal cyst. No peripancreatic edema. Spleen: Spleen is 14 cm cranial caudal length, upper normal Adrenals/Urinary Tract: No adrenal nodule or mass. Kidneys are unremarkable. No evidence of hydroureter. The urinary bladder appears normal for the degree of distention. Stomach/Bowel: Paraesophageal varices noted. Stomach is nondistended. No gastric wall thickening. No evidence of outlet obstruction. Duodenum is normally positioned as is the ligament of Treitz. No small bowel wall thickening. No small bowel dilatation. The terminal ileum is normal. Nonvisualization of the appendix is consistent with the reported history of appendectomy. No gross colonic mass. No colonic wall thickening. No substantial diverticular change. Vascular/Lymphatic: No abdominal aortic aneurysm. No abdominal aortic atherosclerotic calcification. Previously measured hepatoduodenal ligament lymph node is  not evident on today's study. No retroperitoneal lymphadenopathy. No pelvic  sidewall lymphadenopathy. Reproductive: Uterus surgically absent.  There is no adnexal mass. Other: Moderate volume ascites not substantially changed in the interval. Musculoskeletal: Bone windows reveal no worrisome lytic or sclerotic osseous lesions. IMPRESSION: 1. Stable exam.  No new or acute interval findings. 2. Cirrhotic changes in the liver as before with borderline splenomegaly and paraesophageal varices, features suggesting portal venous hypertension. 3. Cholelithiasis. 4. Moderate volume ascites, not substantially changed in the interval. Electronically Signed   By: Misty Stanley M.D.   On: 10/21/2015 23:28   US Paracentesis  10/24/2015  INDICATION: Hepatitis-C with a history of cirrhosis. Recurrent abdominal ascites. Request made for therapeutic paracentesis. EXAM: ULTRASOUND GUIDED THERAPEUTIC PARACENTESIS MEDICATIONS: 1% lidocaine COMPLICATIONS: None immediate. PROCEDURE: Informed written consent was obtained from the patient after a discussion of the risks, benefits and alternatives to treatment. A timeout was performed prior to the initiation of the procedure. Initial ultrasound scanning demonstrates a small amount of ascites within the right upper abdominal quadrant. The right upper abdomen was prepped and draped in the usual sterile fashion. 1% lidocaine was used for local anesthesia. Following this, a 19 gauge, 7-cm, Yueh catheter was introduced. An ultrasound image was saved for documentation purposes. The paracentesis was performed. The catheter was removed and a dressing was applied. The patient tolerated the procedure well without immediate post procedural complication. FINDINGS: A total of approximately 1.9 L of clear yellow fluid was removed. IMPRESSION: Successful ultrasound-guided paracentesis yielding 1.9 liters of peritoneal fluid. Read by: Saverio Danker, PA-C Electronically Signed   By: Corrie Mckusick D.O.    On: 10/24/2015 12:02   US Paracentesis  10/22/2015  INDICATION: Hepatitis-C. Cirrhosis. Recurrent ascites. Request for diagnostic and therapeutic paracentesis. EXAM: ULTRASOUND GUIDED RIGHT LATERAL ABDOMEN PARACENTESIS MEDICATIONS: 1% Lidocaine. COMPLICATIONS: None immediate. PROCEDURE: Informed written consent was obtained from the patient after a discussion of the risks, benefits and alternatives to treatment. A timeout was performed prior to the initiation of the procedure. Initial ultrasound scanning demonstrates a moderate amount of ascites within the right lower abdominal quadrant. The right lower abdomen was prepped and draped in the usual sterile fashion. 1% lidocaine with epinephrine was used for local anesthesia. Following this, a 19 gauge, 7-cm, Yueh catheter was introduced. An ultrasound image was saved for documentation purposes. The paracentesis was performed. The catheter was removed and a dressing was applied. The patient tolerated the procedure well without immediate post procedural complication. FINDINGS: A total of approximately 1.4 liters of clear yellow fluid was removed. Samples were sent to the laboratory as requested by the clinical team. IMPRESSION: Successful ultrasound-guided paracentesis yielding 1.4 liters of peritoneal fluid. Read by:  Gareth Eagle, PA-C Electronically Signed   By: Lucrezia Europe M.D.   On: 10/22/2015 09:33    Time Spent in minutes 25   Louellen Molder M.D on 10/24/2015 at 1:16 PM  Between 7am to 7pm - Pager - [phone]  After 7pm go to [email or web address] - password [facility]  [facility] -  Office  [phone]

## 2015-10-25 DIAGNOSIS — Z794 Long term (current) use of insulin: Secondary | ICD-10-CM

## 2015-10-25 DIAGNOSIS — E1165 Type 2 diabetes mellitus with hyperglycemia: Secondary | ICD-10-CM

## 2015-10-25 DIAGNOSIS — K7031 Alcoholic cirrhosis of liver with ascites: Secondary | ICD-10-CM

## 2015-10-25 DIAGNOSIS — R1084 Generalized abdominal pain: Secondary | ICD-10-CM

## 2015-10-25 DIAGNOSIS — F1410 Cocaine abuse, uncomplicated: Secondary | ICD-10-CM

## 2015-10-25 LAB — GLUCOSE, CAPILLARY
GLUCOSE-CAPILLARY: 139 mg/dL — AB (ref 65–99)
GLUCOSE-CAPILLARY: 126 mg/dL — AB (ref 65–99)
GLUCOSE-CAPILLARY: 219 mg/dL — AB (ref 65–99)
Glucose-Capillary: 359 mg/dL — ABNORMAL HIGH (ref 65–99)

## 2015-10-25 LAB — BASIC METABOLIC PANEL
Anion gap: 5 (ref 5–15)
BUN: 8 mg/dL (ref 6–20)
CO2: 29 mmol/L (ref 22–32)
Calcium: 8.6 mg/dL — ABNORMAL LOW (ref 8.9–10.3)
Chloride: 101 mmol/L (ref 101–111)
Glucose, Bld: 166 mg/dL — ABNORMAL HIGH (ref 65–99)
Potassium: 3.3 mmol/L — ABNORMAL LOW (ref 3.5–5.1)
SODIUM: 135 mmol/L (ref 135–145)

## 2015-10-25 LAB — MAGNESIUM: MAGNESIUM: 1.6 mg/dL — AB (ref 1.7–2.4)

## 2015-10-25 MED ORDER — FUROSEMIDE 10 MG/ML IJ SOLN
40.0000 mg | Freq: Three times a day (TID) | INTRAMUSCULAR | Status: DC
  Administered 2015-10-25 (×2): 40 mg via INTRAVENOUS
  Filled 2015-10-25 (×2): qty 4

## 2015-10-25 MED ORDER — POTASSIUM CHLORIDE CRYS ER 20 MEQ PO TBCR
40.0000 meq | EXTENDED_RELEASE_TABLET | Freq: Once | ORAL | Status: AC
  Administered 2015-10-25: 40 meq via ORAL
  Filled 2015-10-25: qty 2

## 2015-10-25 MED ORDER — MAGNESIUM SULFATE 2 GM/50ML IV SOLN
2.0000 g | Freq: Once | INTRAVENOUS | Status: AC
  Administered 2015-10-25: 2 g via INTRAVENOUS
  Filled 2015-10-25: qty 50

## 2015-10-25 MED ORDER — ALBUMIN HUMAN 25 % IV SOLN
50.0000 g | Freq: Once | INTRAVENOUS | Status: DC
  Filled 2015-10-25 (×2): qty 200

## 2015-10-25 MED ORDER — INSULIN ASPART 100 UNIT/ML ~~LOC~~ SOLN
9.0000 [IU] | Freq: Three times a day (TID) | SUBCUTANEOUS | Status: DC
  Administered 2015-10-25 – 2015-10-27 (×5): 9 [IU] via SUBCUTANEOUS

## 2015-10-25 MED ORDER — OXYCODONE HCL 5 MG PO TABS
10.0000 mg | ORAL_TABLET | Freq: Four times a day (QID) | ORAL | Status: DC | PRN
Start: 2015-10-25 — End: 2015-10-27
  Administered 2015-10-25 – 2015-10-27 (×8): 10 mg via ORAL
  Filled 2015-10-25 (×8): qty 2

## 2015-10-25 NOTE — Progress Notes (Signed)
Inpatient Diabetes Program Recommendations  AACE/ADA: New Consensus Statement on Inpatient Glycemic Control (2015)  Target Ranges:  Prepandial:   less than 140 mg/dL      Peak postprandial:   less than 180 mg/dL (1-2 hours)      Critically ill patients:  140 - 180 mg/dL   Results for [name], [name] (MRN [id number]) as of 10/25/2015 12:25  Ref. Range 10/24/2015 08:04 10/24/2015 12:52 10/24/2015 17:25 10/24/2015 21:26  Glucose-Capillary Latest Ref Range: 65-99 mg/dL 197 (H) 242 (H) 92 300 (H)   Results for [name], [name] (MRN [id number]) as of 10/25/2015 12:25  Ref. Range 10/25/2015 07:35 10/25/2015 11:37  Glucose-Capillary Latest Ref Range: 65-99 mg/dL 139 (H) 359 (H)   Admit with: SBP/ Sepsis/ DKA  History: DM, ETOH, Cirrhosis  Home DM Meds: Lantus 40 units QHS  Novolog 4 units tidwc  Metformin 500 mg bid  Current Insulin Orders: Lantus 30 units bid  Novolog Sensitive Correction Scale/ SSI (0-9 units) TID AC + HS  Novolog 7 units tidwc     MD- Please consider increasing Novolog Meal Coverage further to 8 units tid with meals     --Will follow patient during hospitalization--  Wyn Quaker RN, MSN, CDE Diabetes Coordinator Inpatient Glycemic Control Team Team Pager: [phone] (8a-5p)

## 2015-10-25 NOTE — Consult Note (Signed)
Consultation  Referring Provider: [facility] hospitalist / Dhungel Primary Care Physician:  Cathlean Cower, MD Primary Gastroenterologist:  Dr.Gessner  Reason for Consultation:  Cirrhosis [name] /abdominal pain  HPI: [name] is a 50 y.o. female known to Dr. 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 She is not actively drinking. She has had problems with ascites over the past couple of years and has had 5 paracenteses over the past 6 months. She says she always has abdominal discomfort but presented to the hospital on 10/22/2015 because of progressive abdominal discomfort and tightness over the past week or so. She had not had any nausea or vomiting but says she gets a tight she feels short of breath and doesn't eat much. No melena or hematochezia, no fever or chills. She states she has been taking Lasix and Aldactone as directed at home on a had not run out of medications and tries to follow a low-sodium diet. She had paracentesis of 1.7 L of fluid in May 2017. On admission 7/2 she was scheduled for paracentesis with removal of 1.4 L. Fluid analysis reviewed and not consistent with SBP. On 10/24/2015 due to persistent complaints of abdominal pain she had a second paracentesis of 1.9 liters. She is still very uncomfortable and says her abdomen feels very tight. She is asking for pain medication and says that the oxycodone is not helping. Last EGD April 2017 of grade 1 esophageal varices and multiple small gastric erosions consistent with portal gastropathy  Past Medical History  Diagnosis Date  . Impaired glucose tolerance 11/07/2010  . Hepatitis C approx dx 2005    no tx to date  . Pancreatitis     HX of  . Alcohol dependency ([place])     at least [place] admissions 2007- 09/2013 for detox.   Marland Kitchen GERD (gastroesophageal reflux disease)   . Chronic constipation   . Gallstones   . Drug dependency ([place])     Hx of cocaine use  . Anxiety and depression 08/2011    . Rheumatoid factor positive   . Hemochromatosis     last phlebotomy tx ~ 2012. liver bx 2006  . Pancytopenia   . Abnormal vaginal bleeding     uterine fibroid  . Gallstone 2006  . Chronic abdominal pain 2011  . Cirrhosis ([place]) 11/09/2010  . Type II or unspecified type diabetes mellitus without mention of complication, uncontrolled 09/17/2011  . Major depression ([place]) 11/2012, 09/2013    [facility] admissions for this, suicide attempt by OD Ambien (2014), Prozac (2015),ETOHism   . Anxiety     Past Surgical History  Procedure Laterality Date  . Appendectomy    . Cesarean section  x 2  . Foot surgery  2013  . Abdominal hysterectomy  12/17/2011    Procedure: HYSTERECTOMY ABDOMINAL;  Surgeon: Gus Height, MD;  Location: [facility];  Service: Gynecology;  Laterality: N/A;  . Salpingoophorectomy  12/17/2011    Procedure: SALPINGO OOPHERECTOMY;  Surgeon: Gus Height, MD;  Location: [facility];  Service: Gynecology;  Laterality: Bilateral;  . Esophagogastroduodenoscopy N/A 12/10/2013    Procedure: ESOPHAGOGASTRODUODENOSCOPY (EGD);  Surgeon: Ladene Artist, MD;  Location: [facility];  Service: Endoscopy;  Laterality: N/A;  . Percutaneous liver biopsy  2011  . Colonoscopy N/A 02/18/2014    Procedure: COLONOSCOPY;  Surgeon: Gatha Mayer, MD;  Location: [facility];  Service: Endoscopy;  Laterality: N/A;  . Esophagogastroduodenoscopy (egd) with propofol N/A 08/08/2015    Procedure: ESOPHAGOGASTRODUODENOSCOPY (EGD)  WITH PROPOFOL;  Surgeon: Gatha Mayer, MD;  Location: [facility];  Service: Endoscopy;  Laterality: N/A;    Prior to Admission medications   Medication Sig Start Date End Date Taking? Authorizing Provider  atenolol (TENORMIN) 25 MG tablet Take 0.5 tablets (12.5 mg total) by mouth daily. 08/10/15  Yes Barton Dubois, MD  calcium carbonate (TUMS EX) 750 MG chewable tablet Chew 3 tablets by mouth 4 (four) times daily as needed for heartburn.   Yes Historical Provider, MD  folic acid (FOLVITE) 1 MG  tablet Take 1 tablet (1 mg total) by mouth daily. 05/27/15  Yes Lavina Hamman, MD  furosemide (LASIX) 40 MG tablet Take 2 tablets (80 mg total) by mouth daily. 08/24/15  Yes Gatha Mayer, MD  insulin aspart (NOVOLOG FLEXPEN) 100 UNIT/ML FlexPen Inject 4 Units into the skin 3 (three) times daily with meals.    Yes Historical Provider, MD  insulin glargine (LANTUS) 100 UNIT/ML injection INJECT 0.4 MLS (40 UNITS TOTAL) INTO THE SKIN AT BEDTIME. 08/24/15  Yes Gatha Mayer, MD  Insulin Pen Needle (BD PEN NEEDLE NANO U/F) 32G X 4 MM MISC Use as needed to inject insulin as prescribed 08/10/15  Yes Barton Dubois, MD  KLOR-CON M10 10 MEQ tablet Take 1 tablet (10 mEq total) by mouth daily. 08/10/15  Yes Barton Dubois, MD  lactulose (CHRONULAC) 10 GM/15ML solution Take 30 mLs (20 g total) by mouth 3 (three) times daily. Hold for the day after you have 3 soft stool/day. 05/27/15  Yes Lavina Hamman, MD  metFORMIN (GLUCOPHAGE) 500 MG tablet TAKE 1 TABLET (500 MG TOTAL) BY MOUTH 2 (TWO) TIMES DAILY WITH A MEAL. 05/22/15  Yes Biagio Borg, MD  Multiple Vitamin (MULTIVITAMIN WITH MINERALS) TABS tablet Take 1 tablet by mouth daily.    Yes Historical Provider, MD  pantoprazole (PROTONIX) 40 MG tablet Take 1 tablet (40 mg total) by mouth 2 (two) times daily. 08/10/15  Yes Barton Dubois, MD  sertraline (ZOLOFT) 100 MG tablet Take 150 mg by mouth daily.   Yes Historical Provider, MD  spironolactone (ALDACTONE) 100 MG tablet Take 2 tablets (200 mg total) by mouth daily. 08/24/15  Yes Gatha Mayer, MD  thiamine 100 MG tablet Take 1 tablet (100 mg total) by mouth daily. 05/27/15  Yes Lavina Hamman, MD  diphenhydrAMINE (BENADRYL) 25 MG tablet Take 4 tablets (100 mg total) by mouth at bedtime as needed for sleep. Patient not taking: Reported on 10/21/2015 08/10/15   Barton Dubois, MD  nystatin cream (MYCOSTATIN) Apply topically 3 (three) times daily. Apply to groin area and inner thighs Patient not taking: Reported on 10/21/2015 08/10/15    Barton Dubois, MD  oxyCODONE (OXY IR/ROXICODONE) 5 MG immediate release tablet Take 1 tablet (5 mg total) by mouth every 6 (six) hours as needed for severe pain. Patient not taking: Reported on 10/21/2015 08/24/15   Gatha Mayer, MD  rifaximin (XIFAXAN) 550 MG TABS tablet Take 1 tablet (550 mg total) by mouth 2 (two) times daily. Patient not taking: Reported on 10/21/2015 05/27/15   Lavina Hamman, MD    Current Facility-Administered Medications  Medication Dose Route Frequency Provider Last Rate Last Dose  . atenolol (TENORMIN) tablet 25 mg  25 mg Oral Daily Nishant Dhungel, MD   25 mg at 10/25/15 0958  . calcium carbonate (TUMS - dosed in mg elemental calcium) chewable tablet 600 mg of elemental calcium  3 tablet Oral QID PRN Ivor Costa, MD      .  cefTRIAXone (ROCEPHIN) 2 g in dextrose 5 % 50 mL IVPB  2 g Intravenous Q24H Leann T Poindexter, RPH   2 g at 10/25/15 0046  . dextromethorphan-guaiFENesin (MUCINEX DM) 30-600 MG per 12 hr tablet 1 tablet  1 tablet Oral BID Ivor Costa, MD   1 tablet at 10/25/15 0945  . docusate sodium (COLACE) capsule 100 mg  100 mg Oral BID Ivor Costa, MD   100 mg at 10/25/15 0947  . feeding supplement (BOOST / RESOURCE BREEZE) liquid 1 Container  1 Container Oral TID BM Louellen Molder, MD   1 Container at 10/25/15 0949  . folic acid (FOLVITE) tablet 1 mg  1 mg Oral Daily Ivor Costa, MD   1 mg at 10/25/15 0948  . furosemide (LASIX) tablet 80 mg  80 mg Oral Daily Nishant Dhungel, MD   80 mg at 10/25/15 0945  . insulin aspart (novoLOG) injection 0-5 Units  0-5 Units Subcutaneous QHS Charlynne Cousins, MD   3 Units at 10/24/15 2228  . insulin aspart (novoLOG) injection 0-9 Units  0-9 Units Subcutaneous TID WC Charlynne Cousins, MD   9 Units at 10/25/15 1214  . insulin aspart (novoLOG) injection 7 Units  7 Units Subcutaneous TID WC Nishant Dhungel, MD   7 Units at 10/25/15 1214  . insulin glargine (LANTUS) injection 30 Units  30 Units Subcutaneous BID Nishant Dhungel, MD   30  Units at 10/25/15 0942  . lactulose (CHRONULAC) 10 GM/15ML solution 20 g  20 g Oral TID Ivor Costa, MD   20 g at 10/25/15 0944  . LORazepam (ATIVAN) injection 0-4 mg  0-4 mg Intravenous Q12H Ivor Costa, MD   0 mg at 10/25/15 1217  . multivitamin with minerals tablet 1 tablet  1 tablet Oral Daily Ivor Costa, MD   1 tablet at 10/25/15 0947  . ondansetron (ZOFRAN) injection 4 mg  4 mg Intravenous Q8H PRN Ivor Costa, MD      . oxyCODONE (Oxy IR/ROXICODONE) immediate release tablet 5 mg  5 mg Oral Q6H PRN Ivor Costa, MD   5 mg at 10/25/15 0651  . oxyCODONE (Oxy IR/ROXICODONE) immediate release tablet 5 mg  5 mg Oral Once Jeryl Columbia, NP   5 mg at 10/24/15 2053  . pantoprazole (PROTONIX) EC tablet 40 mg  40 mg Oral BID Ivor Costa, MD   40 mg at 10/25/15 0947  . rifaximin (XIFAXAN) tablet 550 mg  550 mg Oral BID Charlynne Cousins, MD   550 mg at 10/25/15 0944  . sertraline (ZOLOFT) tablet 150 mg  150 mg Oral Daily Ivor Costa, MD   150 mg at 10/25/15 0944  . sodium chloride flush (NS) 0.9 % injection 3 mL  3 mL Intravenous Q12H Ivor Costa, MD   3 mL at 10/25/15 0948  . spironolactone (ALDACTONE) tablet 200 mg  200 mg Oral Daily Nishant Dhungel, MD   200 mg at 10/25/15 0943  . thiamine (VITAMIN B-1) tablet 100 mg  100 mg Oral Daily Ivor Costa, MD   100 mg at 10/25/15 0944    Allergies as of 10/21/2015 - Review Complete 10/21/2015  Allergen Reaction Noted  . Betadine [povidone iodine] Hives 12/24/2013    Family History  Problem Relation Age of Onset  . Cancer Mother     ovarian  . Diabetes Father     Social History   Social History  . Marital Status: Married    Spouse Name: N/A  . Number of Children: 3  .  Years of Education: N/A   Occupational History  . formed [employer]     now staying home   Social History Main Topics  . Smoking status: Never Smoker   . Smokeless tobacco: Never Used  . Alcohol Use: Yes     Comment: one pint vodka daily years  . Drug Use: No     Comment:  denied all drugs during admission  . Sexual Activity: Not Currently    Birth Control/ Protection: None   Other Topics Concern  . Not on file   Social History Narrative    Review of Systems: Pertinent positive and negative review of systems were noted in the above HPI section.  All other review of systems was otherwise negative.  Physical Exam: Vital signs in last 24 hours: Temp:  [98.5 F (36.9 C)-99 F (37.2 C)] 98.5 F (36.9 C) (07/05 0506) Pulse Rate:  [87-111] 94 (07/05 0958) Resp:  [9-18] 18 (07/05 0506) BP: (83-103)/(64-74) 103/74 mmHg (07/05 0958) SpO2:  [95 %-100 %] 99 % (07/05 0506) Weight:  [104 lb 8 oz (47.4 kg)-107 lb (48.535 kg)] 107 lb (48.535 kg) (07/05 0506) Last BM Date: 10/25/15 General:   Alert,  [name] ill appearing WF cachectic with very large abdomen pleasant and cooperative in NAD Head:  Normocephalic and atraumatic. Eyes:  Sclera clear, no icterus.   Conjunctiva pink. Ears:  Normal auditory acuity. Nose:  No deformity, discharge,  or lesions. Mouth:  No deformity or lesions.   Neck:  Supple; no masses or thyromegaly. Lungs:  Clear throughout to auscultation.   No wheezes, crackles, or rhonchi. Heart:  Regular rate and rhythm; no murmurs, clicks, rubs,  or gallops. Abdomen: Tense Ascites, diffusely tender no palpable mass, she also has fluid in the soft tissues of her flanks Rectal:  Deferred  Msk:  Symmetrical without gross deformities. . Pulses:  Normal pulses noted. Extremities: thin, muscle wasting, Without clubbing or edema. Neurologic:  Alert and  oriented x4;  grossly normal neurologically. Skin:  Intact without significant lesions or rashes.. Psych:  Alert and cooperative. Normal mood and affect.  Intake/Output from previous day: 07/04 0701 - 07/05 0700 In: 750 [P.O.:600; IV Piggyback:50] Out: -  Intake/Output this shift:    Lab Results:  Recent Labs  10/23/15 0848 10/24/15 0345  WBC 3.1* 3.0*  HGB 10.3* 9.9*    HCT 32.6* 31.5*  PLT 91* 89*   BMET  Recent Labs  10/23/15 0848 10/24/15 0345 10/25/15 0440  NA 130* 134* 135  K 4.5 3.9 3.3*  CL 98* 102 101  CO2 25 28 29   GLUCOSE 328* 208* 166*  BUN <5* 6 8  CREATININE 0.43* 0.35* <0.30*  CALCIUM 8.6* 8.5* 8.6*   LFT  Recent Labs  10/24/15 0345  PROT 6.7  ALBUMIN 3.1*  AST 155*  ALT 66*  ALKPHOS 135*  BILITOT 1.0   PT/INR No results for input(s): LABPROT, INR in the last 72 hours. Hepatitis Panel No results for input(s): HEPBSAG, HCVAB, HEPAIGM, HEPBIGM in the last 72 hours.    IMPRESSION:  #29 50 year old white female with decompensated cirrhosis and refractory ascites Patient with history of EtOH abuse and hep C. Patient admitted with progressive abdominal discomfort and increase in ascites 1 week despite Aldactone 200 mg per day and Lasix 80 mg per day I think her pain is due to tense ascites #2 small esophageal varices on EGD in April 2017 #3 portal gastropathy #4  chronic anemia  PLAN: #1 change to 2 g  sodium diet #2 we will increase Lasix and change to IV to try to diurese #3 schedule for large volume paracentesis-with removal of 5-6 L of fluid, albumin will be given as well #4 we'll discuss possibility of TIPS. #5 I think she has reason to be in pain and uncomfortable at this point and not opposed to IV pain medication- use sparingly with knowledge that the patient will not be given  Narcotics on discharge We'll follow with you    Amy Esterwood  10/25/2015, 12:51 PM   GI ATTENDING  History, laboratories, x-rays reviewed. Patient personally seen and examined. Agree with comprehensive consultation note as outlined above. Patient with cirrhosis with portal hypertension and associated ascites. Though I agree that her ascites may be making her abdomen uncomfortable and causing discomfort, her complaints are out of portion to her overall appearance. As well, she to use to request narcotics. Thus, do think there is a  drug-seeking component. In any event, agree with large volume paracentesis and albumin replacement. Sodium restriction imperative in order to maximize efficacy of diuretics.  Docia Chuck. Geri Seminole., M.D. [facility] Division of Gastroenterology

## 2015-10-25 NOTE — Progress Notes (Signed)
PT Cancellation Note  Patient Details Name: [name] MRN: [id number] DOB: Oct 02, 1965   Cancelled Treatment:     pt declined due to severe ABD pain and waiting on meds.  Will re attempt another day as schedule permits   Rica Koyanagi  PTA [facility]  Acute  Rehab Pager      [phone]

## 2015-10-25 NOTE — Progress Notes (Signed)
PROGRESS NOTE        PATIENT DETAILS Name: [name] Age: 50 y.o. Sex: female Date of Birth: 05-Sep-1965 Admit Date: 10/21/2015 Admitting Physician Ivor Costa, MD [id number] [name], MD  Brief Narrative: Patient is a 50 y.o. female with history of liver cirrhosis, cocaine use, alcohol use presented with abdominal pain and distention. She was subsequently admitted, and underwent paracentesis 2, but continues to have rapid accumulation of ascites and ongoing abdominal pain. CT scan of the abdomen did not show any major acute abnormalities. Ascitic fluid not consistent with SBP.  Subjective: Continues to have abdominal distention and abdominal pain.  Assessment/Plan: Refractory ascites: Underwent paracentesis 2, on Aldactone and Lasix. I have consulted gastroenterology to see if patient will benefit from any other procedure/further evaluation while inpatient.  Abdominal pain: Suspect some of this is chronic, and some component of this may be also related to refractory ascites. CT scan abdomen negative for acute abnormalities. Peritoneal fluid analysis not consistent with SBP-I will go ahead and discontinue IV ceftriaxone at this point. Continue supportive care, and await further input from gastroenterology.  Decompensated liver cirrhosis: Now with refractory ascites-but otherwise appears stable. Await gastroenterology input.  Alcohol dependence: Claims that she relapsed a few weeks back after being clean for approximately 6 months. No signs of withdrawal currently.  Type 2 diabetes: CBGs continue to fluctuate-continue Lantus 30 units twice a day, add scheduled pre-meal NovoLog. Follow.  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 Continue diuretic regimen  Alcohol abuse: She claims she relapsed a few weeks prior to this admission after not using alcohol for approximately 6 months. She she was  counseled.  Cocaine abuse: Counseled.  DVT Prophylaxis: SCD's  Code Status: Full code   Family Communication: None at bedside-patient is awake and alert, and understanding of the above noted plan  Disposition Plan: Remain inpatient- home in the next 1-2 days  Antimicrobial agents: None  Procedures: 7/2: Paracentesis 7/4: Paracentesis   CONSULTS:  GI  Time spent: 25 minutes-Greater than 50% of this time was spent in counseling, explanation of diagnosis, planning of further management, and coordination of care.  MEDICATIONS: Anti-infectives    Start     Dose/Rate Route Frequency Ordered Stop   10/23/15 0100  cefTRIAXone (ROCEPHIN) 2 g in dextrose 5 % 50 mL IVPB     2 g 100 mL/hr over 30 Minutes Intravenous Every 24 hours 10/22/15 0046     10/22/15 1000  rifaximin (XIFAXAN) tablet 550 mg     550 mg Oral 2 times daily 10/22/15 0709     10/22/15 0045  cefTRIAXone (ROCEPHIN) 1 g in dextrose 5 % 50 mL IVPB  Status:  Discontinued     1 g 100 mL/hr over 30 Minutes Intravenous Every 24 hours 10/22/15 0034 10/22/15 0046   10/22/15 0045  cefTRIAXone (ROCEPHIN) 1 g in dextrose 5 % 50 mL IVPB     1 g 100 mL/hr over 30 Minutes Intravenous  Once 10/22/15 0046 10/22/15 0127   10/22/15 0000  cefTRIAXone (ROCEPHIN) 1 g in dextrose 5 % 50 mL IVPB     1 g 100 mL/hr over 30 Minutes Intravenous  Once 10/21/15 2349 10/22/15 0046      Scheduled Meds: . atenolol  25 mg Oral Daily  . cefTRIAXone (ROCEPHIN)  IV  2 g Intravenous  Q24H  . dextromethorphan-guaiFENesin  1 tablet Oral BID  . docusate sodium  100 mg Oral BID  . feeding supplement  1 Container Oral TID BM  . folic acid  1 mg Oral Daily  . furosemide  80 mg Oral Daily  . insulin aspart  0-5 Units Subcutaneous QHS  . insulin aspart  0-9 Units Subcutaneous TID WC  . insulin aspart  7 Units Subcutaneous TID WC  . insulin glargine  30 Units Subcutaneous BID  . lactulose  20 g Oral TID  . LORazepam  0-4 mg Intravenous Q12H  .  multivitamin with minerals  1 tablet Oral Daily  . oxyCODONE  5 mg Oral Once  . pantoprazole  40 mg Oral BID  . rifaximin  550 mg Oral BID  . sertraline  150 mg Oral Daily  . sodium chloride flush  3 mL Intravenous Q12H  . spironolactone  200 mg Oral Daily  . thiamine  100 mg Oral Daily   Continuous Infusions:  PRN Meds:.calcium carbonate, ondansetron (ZOFRAN) IV, oxyCODONE   PHYSICAL EXAM: Vital signs: Filed Vitals:   10/24/15 2124 10/25/15 0506 10/25/15 0958 10/25/15 1321  BP: 101/70 92/70 103/74 100/70  Pulse: 103 89 94 85  Temp: 99 F (37.2 C) 98.5 F (36.9 C)  97.6 F (36.4 C)  TempSrc: Oral Oral  Oral  Resp: 16 18  20   Height:      Weight:  48.535 kg (107 lb)    SpO2: 96% 99%  99%   Filed Weights   10/24/15 0400 10/24/15 1702 10/25/15 0506  Weight: 52.2 kg (115 lb 1.3 oz) 47.4 kg (104 lb 8 oz) 48.535 kg (107 lb)   Body mass index is 19.57 kg/(m^2).   Gen Exam: Awake and alert with clear speech. Not in any distress  Neck: Supple, No JVD.   Chest: B/L Clear.   CVS: S1 S2 Regular, no murmurs.  Abdomen: soft, BS +,Mildly Fucidin tender, abdomen continues to be tense from ascites. Extremities: no edema, lower extremities warm to touch. Neurologic: Non Focal.   Skin: No Rash or lesions   Wounds: N/A.    LABORATORY DATA: CBC:  Recent Labs Lab 10/21/15 2022 10/22/15 0527 10/23/15 0848 10/24/15 0345  WBC 3.9* 3.1* 3.1* 3.0*  NEUTROABS 2.6  --   --   --   HGB 10.8* 10.2* 10.3* 9.9*  HCT 33.4* 31.3* 32.6* 31.5*  MCV 93.6 92.6 93.4 96.6  PLT 110* 94* 91* 89*    Basic Metabolic Panel:  Recent Labs Lab 10/22/15 0525  10/22/15 0940 10/22/15 1334 10/23/15 0848 10/24/15 0345 10/25/15 0440  NA  --   < > 131* 131* 130* 134* 135  K  --   < > 3.5 3.9 4.5 3.9 3.3*  CL  --   < > 93* 92* 98* 102 101  CO2  --   < > 29 31 25 28 29   GLUCOSE  --   < > 113* 301* 328* 208* 166*  BUN  --   < > <5* <5* <5* 6 8  CREATININE  --   < > 0.31* 0.35* 0.43* 0.35* <0.30*    CALCIUM  --   < > 8.2* 8.4* 8.6* 8.5* 8.6*  MG 1.4*  --   --   --   --   --  1.6*  < > = values in this interval not displayed.  GFR: CrCl cannot be calculated (Patient has no serum creatinine result on file.).  Liver Function Tests:  Recent Labs Lab 10/21/15 2022 10/23/15 0848 10/24/15 0345  AST 83* 144* 155*  ALT 58* 60* 66*  ALKPHOS 154* 132* 135*  BILITOT 1.6* 1.8* 1.0  PROT 8.0 7.4 6.7  ALBUMIN 3.0* 3.6 3.1*    Recent Labs Lab 10/21/15 2022  LIPASE 71*   No results for input(s): AMMONIA in the last 168 hours.  Coagulation Profile:  Recent Labs Lab 10/21/15 2022  INR 1.30    Cardiac Enzymes: No results for input(s): CKTOTAL, CKMB, CKMBINDEX, TROPONINI in the last 168 hours.  BNP (last 3 results) No results for input(s): PROBNP in the last 8760 hours.  HbA1C: No results for input(s): HGBA1C in the last 72 hours.  CBG:  Recent Labs Lab 10/24/15 1252 10/24/15 1725 10/24/15 2126 10/25/15 0735 10/25/15 1137  GLUCAP 242* 92 300* 139* 359*    Lipid Profile: No results for input(s): CHOL, HDL, LDLCALC, TRIG, CHOLHDL, LDLDIRECT in the last 72 hours.  Thyroid Function Tests: No results for input(s): TSH, T4TOTAL, FREET4, T3FREE, THYROIDAB in the last 72 hours.  Anemia Panel: No results for input(s): VITAMINB12, FOLATE, FERRITIN, TIBC, IRON, RETICCTPCT in the last 72 hours.  Urine analysis:    Component Value Date/Time   COLORURINE YELLOW 10/21/2015 2041   APPEARANCEUR CLEAR 10/21/2015 2041   LABSPEC 1.041* 10/21/2015 2041   PHURINE 6.0 10/21/2015 2041   GLUCOSEU >1000* 10/21/2015 2041   GLUCOSEU >=1000* 07/18/2014 0943   HGBUR NEGATIVE 10/21/2015 2041   BILIRUBINUR NEGATIVE 10/21/2015 2041   BILIRUBINUR neg 05/19/2013 0855   KETONESUR 40* 10/21/2015 2041   PROTEINUR NEGATIVE 10/21/2015 2041   PROTEINUR neg 05/19/2013 0855   UROBILINOGEN 1.0 03/02/2015 1041   UROBILINOGEN 0.2 05/19/2013 0855   NITRITE NEGATIVE 10/21/2015 2041   NITRITE  neg 05/19/2013 0855   LEUKOCYTESUR NEGATIVE 10/21/2015 2041    Sepsis Labs: Lactic Acid, Venous    Component Value Date/Time   LATICACIDVEN 1.6 10/23/2015 1129    MICROBIOLOGY: Recent Results (from the past 240 hour(s))  MRSA PCR Screening     Status: None   Collection Time: 10/22/15  1:35 AM  Result Value Ref Range Status   MRSA by PCR NEGATIVE NEGATIVE Final    Comment:        The GeneXpert MRSA Assay (FDA approved for NASAL specimens only), is one component of a comprehensive MRSA colonization surveillance program. It is not intended to diagnose MRSA infection nor to guide or monitor treatment for MRSA infections.   Culture, blood (x 2)     Status: None (Preliminary result)   Collection Time: 10/22/15  1:52 AM  Result Value Ref Range Status   Specimen Description BLOOD LEFT ARM  Final   Special Requests BOTTLES DRAWN AEROBIC AND ANAEROBIC 5CC  Final   Culture   Final    NO GROWTH 2 DAYS Performed at [facility]    Report Status PENDING  Incomplete  Culture, blood (x 2)     Status: None (Preliminary result)   Collection Time: 10/22/15  1:58 AM  Result Value Ref Range Status   Specimen Description BLOOD LEFT ARM  Final   Special Requests BOTTLES DRAWN AEROBIC AND ANAEROBIC 10CC  Final   Culture   Final    NO GROWTH 2 DAYS Performed at [facility]    Report Status PENDING  Incomplete  Culture, body fluid-bottle     Status: None (Preliminary result)   Collection Time: 10/22/15  9:30 AM  Result Value Ref Range Status   Specimen Description  FLUID ABDOMEN  Final   Special Requests NONE  Final   Culture   Final    NO GROWTH 2 DAYS Performed at [facility]    Report Status PENDING  Incomplete  Gram stain     Status: None   Collection Time: 10/22/15  9:30 AM  Result Value Ref Range Status   Specimen Description FLUID ABDOMEN  Final   Special Requests NONE  Final   Gram Stain   Final    MODERATE WBC PRESENT,BOTH PMN AND MONONUCLEAR NO  ORGANISMS SEEN Performed at [facility]    Report Status 10/22/2015 FINAL  Final    RADIOLOGY STUDIES/RESULTS: Ct Abdomen Pelvis W Contrast  10/21/2015  CLINICAL DATA:  Abdominal pain in a patient with a history of alcoholic cirrhosis, hepatitis-C and chronic pancreatitis. EXAM: CT ABDOMEN AND PELVIS WITH CONTRAST TECHNIQUE: Multidetector CT imaging of the abdomen and pelvis was performed using the standard protocol following bolus administration of intravenous contrast. CONTRAST:  93mL ISOVUE-300 IOPAMIDOL (ISOVUE-300) INJECTION 61% COMPARISON:  03/30/2015 FINDINGS: Lower chest:  Dependent atelectasis. Hepatobiliary: Markedly nodular liver contour compatible with reported history of cirrhosis. No evidence for enhancing lesion within the hepatic parenchyma 5 mm gallstone identified in the gallbladder. No intrahepatic or extrahepatic biliary dilation. Pancreas: No focal mass lesion. No dilatation of the main duct. No intraparenchymal cyst. No peripancreatic edema. Spleen: Spleen is 14 cm cranial caudal length, upper normal Adrenals/Urinary Tract: No adrenal nodule or mass. Kidneys are unremarkable. No evidence of hydroureter. The urinary bladder appears normal for the degree of distention. Stomach/Bowel: Paraesophageal varices noted. Stomach is nondistended. No gastric wall thickening. No evidence of outlet obstruction. Duodenum is normally positioned as is the ligament of Treitz. No small bowel wall thickening. No small bowel dilatation. The terminal ileum is normal. Nonvisualization of the appendix is consistent with the reported history of appendectomy. No gross colonic mass. No colonic wall thickening. No substantial diverticular change. Vascular/Lymphatic: No abdominal aortic aneurysm. No abdominal aortic atherosclerotic calcification. Previously measured hepatoduodenal ligament lymph node is not evident on today's study. No retroperitoneal lymphadenopathy. No pelvic sidewall lymphadenopathy.  Reproductive: Uterus surgically absent.  There is no adnexal mass. Other: Moderate volume ascites not substantially changed in the interval. Musculoskeletal: Bone windows reveal no worrisome lytic or sclerotic osseous lesions. IMPRESSION: 1. Stable exam.  No new or acute interval findings. 2. Cirrhotic changes in the liver as before with borderline splenomegaly and paraesophageal varices, features suggesting portal venous hypertension. 3. Cholelithiasis. 4. Moderate volume ascites, not substantially changed in the interval. Electronically Signed   By: Misty Stanley M.D.   On: 10/21/2015 23:28   US Paracentesis  10/24/2015  INDICATION: Hepatitis-C with a history of cirrhosis. Recurrent abdominal ascites. Request made for therapeutic paracentesis. EXAM: ULTRASOUND GUIDED THERAPEUTIC PARACENTESIS MEDICATIONS: 1% lidocaine COMPLICATIONS: None immediate. PROCEDURE: Informed written consent was obtained from the patient after a discussion of the risks, benefits and alternatives to treatment. A timeout was performed prior to the initiation of the procedure. Initial ultrasound scanning demonstrates a small amount of ascites within the right upper abdominal quadrant. The right upper abdomen was prepped and draped in the usual sterile fashion. 1% lidocaine was used for local anesthesia. Following this, a 19 gauge, 7-cm, Yueh catheter was introduced. An ultrasound image was saved for documentation purposes. The paracentesis was performed. The catheter was removed and a dressing was applied. The patient tolerated the procedure well without immediate post procedural complication. FINDINGS: A total of approximately 1.9 L of clear  yellow fluid was removed. IMPRESSION: Successful ultrasound-guided paracentesis yielding 1.9 liters of peritoneal fluid. Read by: Saverio Danker, PA-C Electronically Signed   By: Corrie Mckusick D.O.   On: 10/24/2015 12:02   US Paracentesis  10/22/2015  INDICATION: Hepatitis-C. Cirrhosis. Recurrent  ascites. Request for diagnostic and therapeutic paracentesis. EXAM: ULTRASOUND GUIDED RIGHT LATERAL ABDOMEN PARACENTESIS MEDICATIONS: 1% Lidocaine. COMPLICATIONS: None immediate. PROCEDURE: Informed written consent was obtained from the patient after a discussion of the risks, benefits and alternatives to treatment. A timeout was performed prior to the initiation of the procedure. Initial ultrasound scanning demonstrates a moderate amount of ascites within the right lower abdominal quadrant. The right lower abdomen was prepped and draped in the usual sterile fashion. 1% lidocaine with epinephrine was used for local anesthesia. Following this, a 19 gauge, 7-cm, Yueh catheter was introduced. An ultrasound image was saved for documentation purposes. The paracentesis was performed. The catheter was removed and a dressing was applied. The patient tolerated the procedure well without immediate post procedural complication. FINDINGS: A total of approximately 1.4 liters of clear yellow fluid was removed. Samples were sent to the laboratory as requested by the clinical team. IMPRESSION: Successful ultrasound-guided paracentesis yielding 1.4 liters of peritoneal fluid. Read by:  Gareth Eagle, PA-C Electronically Signed   By: Lucrezia Europe M.D.   On: 10/22/2015 09:33     LOS: 3 days   Oren Binet, MD  [facility] Pager:[phone]  If 7PM-7AM, please contact night-coverage [email or web address] Password [email or web address] 10/25/2015, 1:24 PM

## 2015-10-26 ENCOUNTER — Inpatient Hospital Stay (HOSPITAL_COMMUNITY): Payer: Medicaid Other

## 2015-10-26 DIAGNOSIS — F1029 Alcohol dependence with unspecified alcohol-induced disorder: Secondary | ICD-10-CM

## 2015-10-26 DIAGNOSIS — R188 Other ascites: Secondary | ICD-10-CM

## 2015-10-26 LAB — GLUCOSE, CAPILLARY
GLUCOSE-CAPILLARY: 180 mg/dL — AB (ref 65–99)
GLUCOSE-CAPILLARY: 279 mg/dL — AB (ref 65–99)
Glucose-Capillary: 413 mg/dL — ABNORMAL HIGH (ref 65–99)
Glucose-Capillary: 205 mg/dL — ABNORMAL HIGH (ref 65–99)
Glucose-Capillary: 135 mg/dL — ABNORMAL HIGH (ref 65–99)

## 2015-10-26 LAB — BASIC METABOLIC PANEL
ANION GAP: 3 — AB (ref 5–15)
BUN: 12 mg/dL (ref 6–20)
CALCIUM: 8.9 mg/dL (ref 8.9–10.3)
CHLORIDE: 99 mmol/L — AB (ref 101–111)
CO2: 33 mmol/L — AB (ref 22–32)
Creatinine, Ser: 0.53 mg/dL (ref 0.44–1.00)
GFR calc non Af Amer: >60 mL/min (ref >60)
GLUCOSE: 188 mg/dL — AB (ref 65–99)
Potassium: 3.3 mmol/L — ABNORMAL LOW (ref 3.5–5.1)
Sodium: 135 mmol/L (ref 135–145)

## 2015-10-26 LAB — GLUCOSE, RANDOM: Glucose, Bld: 427 mg/dL — ABNORMAL HIGH (ref 65–99)

## 2015-10-26 MED ORDER — INSULIN ASPART 100 UNIT/ML ~~LOC~~ SOLN
20.0000 [IU] | Freq: Once | SUBCUTANEOUS | Status: AC
Start: 2015-10-26 — End: 2015-10-26
  Administered 2015-10-26: 20 [IU] via SUBCUTANEOUS

## 2015-10-26 MED ORDER — DIPHENHYDRAMINE HCL 50 MG PO CAPS
50.0000 mg | ORAL_CAPSULE | Freq: Once | ORAL | Status: AC
  Administered 2015-10-26: 50 mg via ORAL
  Filled 2015-10-26: qty 1

## 2015-10-26 MED ORDER — FUROSEMIDE 10 MG/ML IJ SOLN
40.0000 mg | Freq: Three times a day (TID) | INTRAMUSCULAR | Status: DC
  Administered 2015-10-26 – 2015-10-27 (×2): 40 mg via INTRAVENOUS
  Filled 2015-10-26 (×2): qty 4

## 2015-10-26 MED ORDER — FUROSEMIDE 10 MG/ML IJ SOLN
40.0000 mg | Freq: Two times a day (BID) | INTRAMUSCULAR | Status: DC
  Filled 2015-10-26: qty 4

## 2015-10-26 MED ORDER — ALBUMIN HUMAN 25 % IV SOLN
50.0000 g | Freq: Once | INTRAVENOUS | Status: DC
  Filled 2015-10-26: qty 200

## 2015-10-26 MED ORDER — DIPHENHYDRAMINE HCL 25 MG PO CAPS
25.0000 mg | ORAL_CAPSULE | ORAL | Status: DC | PRN
  Administered 2015-10-26: 25 mg via ORAL
  Filled 2015-10-26: qty 1

## 2015-10-26 NOTE — Progress Notes (Signed)
Nutrition Follow-up  DOCUMENTATION CODES:   Severe malnutrition in context of chronic illness, Severe malnutrition in context of social or environmental circumstances  INTERVENTION:  - Continue Boost Breeze TID. - Continue to encourage PO intakes of meals. - RD will continue to monitor for needs.  NUTRITION DIAGNOSIS:   Malnutrition related to chronic illness, social / environmental circumstances as evidenced by severe depletion of muscle mass, severe depletion of body fat. -ongoing  GOAL:   Patient will meet greater than or equal to 90% of their needs -variably met  MONITOR:   PO intake, Supplement acceptance, Weight trends, Labs, I & O's  ASSESSMENT:   50 y.o. female with medical history significant of diabetes mellitus, HCV, alcohol abuse, liver cirrhosis with ascites, cocaine abuse, GERD, depression, anxiety, who presents with abdominal pain.  7/6 Per chart review, pt consumed 50% of breakfast this AM. She had received lunch tray shortly before RD visit and had been unable to start meal. Pt states that appetite has been improving and that sensation of items getting stuck in lower esophagus has resolved. She had repeat paracentesis on 7/4 with 1.9L removed at that time. Pt was to have large volume paracentesis today but she reports that it was unable to be performed.   Pt states she likes Boost [place] and would like to continue to receive it TID. Pt variably meeting needs.   Medications reviewed; PRN Tums, 1 mg folic acid, 40 mg IV Lasix every 12 hours, sliding scale Novolog, 20 U Novolog x1 dose today, 30 units Lantus BID, daily multivitamin, 40 mg Protonix BID, 40 mEq KCl x1 dose yesterday, 200 mg Aldactone/day, 100 mg thiamine/day. Labs reviewed; CBGs: 180 and 413 mg/dL this AM, K: 3.3 mmol/L, Cl: 99 mmol/L.   7/3 - Pt eating lunch at time of RD visit with only a few bites consumed.  - Pt had paracentesis yesterday with 1.4 L removed.  - Pt states that ascites has been  worse over the past few weeks than it has every been.  - She states that she has abdominal pressure, without severe or sharp pain, related to abdominal distention. She denies nausea.  - She denies chewing or swallowing issues, but she does state that for the past few weeks it has felt like something was constantly stuck in her lower esophagus and that this sensation worsens with PO intakes.  - Pt denies issues with access to food at home.  - She does indicate that over the past 1 week her appetite has decreased d/t items listed above and that since admission she has been trying to force herself to eat.  - She states that UBW is 160 lbs but she is unsure of the last time she weighed this. - Per chart review, pt has remained mainly stable between 109-111 lbs since September 2016.  - Physical assessment to upper body shows severe muscle and fat wasting; lower body not assessed during this time as pt was eating.  - Talked with pt about available supplements and she is interested in receiving Boost Breeze; will order TID.   Diet Order:  Diet 2 gram sodium Room service appropriate?: Yes; Fluid consistency:: Thin  Skin:  Reviewed, no issues  Last BM:  7/5  Height:   Ht Readings from Last 1 Encounters:  10/24/15 '5\' 2"'$  (1.575 m)    Weight:   Wt Readings from Last 1 Encounters:  10/26/15 96 lb 3.2 oz (43.636 kg)    Ideal Body Weight:  50 kg (kg)  BMI:  Body mass index is 17.59 kg/(m^2).  Estimated Nutritional Needs:   Kcal:  1300-1500 (25-30 kcal/kg)  Protein:  60-70 grams  Fluid:  1.5 L/day  EDUCATION NEEDS:   No education needs identified at this time     Jarome Matin, MS, RD, LDN Inpatient Clinical Dietitian Pager # [phone] After hours/weekend pager # [phone]

## 2015-10-26 NOTE — Progress Notes (Signed)
Physical Therapy Treatment Patient Details Name: [name] MRN: [id number] DOB: 1966/04/22 Today's Date: 10/26/2015    History of Present Illness 50 yo female admitted with abd pain, sepsis. Hx of cirrhosis, DM, ETOH abuse, drug abuse    PT Comments    Assisted pt OOB to bathroom then amb in hallway.  Pt declined need for walker and demonstarted a Mod unsteady gait with occasional need to staedy self on wall/doorway/rail.    Follow Up Recommendations  Supervision/Assistance - 24 hour     Equipment Recommendations  Rolling walker with 5" wheels (if pt would agree)    Recommendations for Other Services       Precautions / Restrictions Precautions Precautions: Fall Restrictions Weight Bearing Restrictions: No    Mobility  Bed Mobility Overal bed mobility: Modified Independent             General bed mobility comments: increased time due to ABD discomfort  Transfers Overall transfer level: Needs assistance Equipment used: Rolling walker (2 wheeled) Transfers: Sit to/from Stand Sit to Stand: Supervision;Min guard         General transfer comment: use of hands to staedy self on multiple surfaces  Ambulation/Gait Ambulation/Gait assistance: Supervision;Min guard Ambulation Distance (Feet): 84 Feet Assistive device: None Gait Pattern/deviations: Step-to pattern;Step-through pattern;Decreased step length - left;Decreased step length - right;Decreased stride length     General Gait Details: pt declined RW and amb in hallway occassionally holding to wall/rail to steady self.  Slightly unsteady gait.  Mof FALL RISK.     Stairs            Wheelchair Mobility    Modified Rankin (Stroke Patients Only)       Balance                                    Cognition Arousal/Alertness: Awake/alert Behavior During Therapy: WFL for tasks assessed/performed Overall Cognitive Status: Within Functional Limits for tasks assessed                      Exercises      General Comments        Pertinent Vitals/Pain Pain Assessment: Faces Faces Pain Scale: Hurts a little bit Pain Location: ABD Pain Descriptors / Indicators: Sore Pain Intervention(s): Monitored during session;Repositioned    Home Living                      Prior Function            PT Goals (current goals can now be found in the care plan section) Progress towards PT goals: Progressing toward goals    Frequency       PT Plan Current plan remains appropriate    Co-evaluation             End of Session Equipment Utilized During Treatment: Gait belt Activity Tolerance: Patient tolerated treatment well Patient left: in bed;with call bell/phone within reach     Time: 0920-0935 PT Time Calculation (min) (ACUTE ONLY): 15 min  Charges:  $Gait Training: 8-22 mins                    G Codes:      Rica Koyanagi  PTA [facility]  Acute  Rehab Pager      [phone]

## 2015-10-26 NOTE — Progress Notes (Signed)
CBG was 413 Dr. Sloan Leiter on unit and was notified and new orders received.

## 2015-10-26 NOTE — Progress Notes (Signed)
Patient ID: [name], female   DOB: [birth year], 49 y.o.   MRN: [id number] Unable to perform paracentesis as there was no safe window in order to access the fluid with risk of injury or complication.  She did have a minimal amount of ascites present, but relatively insignificant.  Tarissa Kerin E

## 2015-10-26 NOTE — Progress Notes (Signed)
PROGRESS NOTE        PATIENT DETAILS Name: [name] Age: 50 y.o. Sex: female Date of Birth: [birth year] Admit Date: 10/21/2015 Admitting Physician Ivor Costa, MD [id number] [name], MD  Brief Narrative: Patient is a 50 y.o. female with history of liver cirrhosis, cocaine use, alcohol use presented with abdominal pain and distention. She was subsequently admitted, and underwent paracentesis 2, but continues to have rapid accumulation of ascites and ongoing abdominal pain. CT scan of the abdomen did not show any major acute abnormalities. Ascitic fluid not consistent with SBP.  Subjective: Abdominal distention continues, abdominal pain better with adjustment in her narcotic regimen. Awaiting paracentesis  Assessment/Plan: Refractory ascites: Underwent paracentesis 2, on Aldactone and Lasix. Seen by gastroenterology, Lasix converted to IV, recommendations are to pursue high-volume paracentesis today. We will reassess post-paracentesis to see if ascites and abdominal pain have improved.   Abdominal pain: Suspect some of this is chronic, and some component of this may be also related to refractory ascites. CT scan abdomen negative for acute abnormalities. Peritoneal fluid analysis not consistent with SBP-hence intravenous ceftriaxone was discontinued. Seen by gastroenterology, thought to have some chronic pain at baseline with worsening due to tense ascites, await reassessment post large volume paracentesis to see if any improvement.  Decompensated liver cirrhosis: Now with refractory ascites-but otherwise appears stable. Appreciate gastroenterology input.  Alcohol dependence: Claims that she relapsed a few weeks back after being clean for approximately 6 months. No signs of withdrawal currently.  Type 2 diabetes: CBGs much more stable today,continue Lantus 30 units twice a day,  scheduled pre-meal NovoLog. Follow.  Pancytopenia: Secondary to liver cirrhosis and  hypersplenism:  Chronic hyponatremia: Secondary liver cirrhosis, stable. Continue diuretic regimen  Alcohol abuse: She claims she relapsed a few weeks prior to this admission after not using alcohol for approximately 6 months. She she was counseled.  Cocaine abuse: Counseled-claims that her last cocaine use was 2-3 weeks. Claims she only uses it intermittently and is not a constant user.  DVT Prophylaxis: SCD's  Code Status: Full code   Family Communication: None at bedside-patient is awake and alert, and understanding of the above noted plan  Disposition Plan: Remain inpatient- home tomorrow if clinical improvement continues  Antimicrobial agents: None  Procedures: 7/2: Paracentesis 7/4: Paracentesis   CONSULTS:  GI  Time spent: 25 minutes-Greater than 50% of this time was spent in counseling, explanation of diagnosis, planning of further management, and coordination of care.  MEDICATIONS: Anti-infectives    Start     Dose/Rate Route Frequency Ordered Stop   10/23/15 0100  cefTRIAXone (ROCEPHIN) 2 g in dextrose 5 % 50 mL IVPB  Status:  Discontinued     2 g 100 mL/hr over 30 Minutes Intravenous Every 24 hours 10/22/15 0046 10/25/15 1329   10/22/15 1000  rifaximin (XIFAXAN) tablet 550 mg     550 mg Oral 2 times daily 10/22/15 0709     10/22/15 0045  cefTRIAXone (ROCEPHIN) 1 g in dextrose 5 % 50 mL IVPB  Status:  Discontinued     1 g 100 mL/hr over 30 Minutes Intravenous Every 24 hours 10/22/15 0034 10/22/15 0046   10/22/15 0045  cefTRIAXone (ROCEPHIN) 1 g in dextrose 5 % 50 mL IVPB     1 g 100 mL/hr over 30 Minutes Intravenous  Once 10/22/15 0046 10/22/15 0127   10/22/15  0000  cefTRIAXone (ROCEPHIN) 1 g in dextrose 5 % 50 mL IVPB     1 g 100 mL/hr over 30 Minutes Intravenous  Once 10/21/15 2349 10/22/15 0046      Scheduled Meds: . albumin human  50 g Intravenous Once  . atenolol  25 mg Oral Daily  . dextromethorphan-guaiFENesin  1 tablet Oral BID  . docusate  sodium  100 mg Oral BID  . feeding supplement  1 Container Oral TID BM  . folic acid  1 mg Oral Daily  . furosemide  40 mg Intravenous Q12H  . insulin aspart  0-5 Units Subcutaneous QHS  . insulin aspart  0-9 Units Subcutaneous TID WC  . insulin aspart  9 Units Subcutaneous TID WC  . insulin glargine  30 Units Subcutaneous BID  . lactulose  20 g Oral TID  . multivitamin with minerals  1 tablet Oral Daily  . oxyCODONE  5 mg Oral Once  . pantoprazole  40 mg Oral BID  . rifaximin  550 mg Oral BID  . sertraline  150 mg Oral Daily  . sodium chloride flush  3 mL Intravenous Q12H  . spironolactone  200 mg Oral Daily  . thiamine  100 mg Oral Daily   Continuous Infusions:  PRN Meds:.calcium carbonate, diphenhydrAMINE, ondansetron (ZOFRAN) IV, oxyCODONE   PHYSICAL EXAM: Vital signs: Filed Vitals:   10/26/15 0606 10/26/15 0804 10/26/15 1038 10/26/15 1041  BP: 107/70 108/81 98/58 103/70  Pulse: 96 93 91 91  Temp: 98.7 F (37.1 C) 98.1 F (36.7 C)    TempSrc: Oral Oral    Resp: 16 17    Height:      Weight: 43.636 kg (96 lb 3.2 oz)     SpO2: 97% 99%     Filed Weights   10/24/15 1702 10/25/15 0506 10/26/15 0606  Weight: 47.4 kg (104 lb 8 oz) 48.535 kg (107 lb) 43.636 kg (96 lb 3.2 oz)   Body mass index is 17.59 kg/(m^2).   Gen Exam: Awake and alert with clear speech. Not in any distress  Neck: Supple, No JVD.   Chest: B/L Clear.   CVS: S1 S2 Regular, no murmurs.  Abdomen: soft, BS +,Mildly tender, abdomen continues to be tense from ascites. Extremities: no edema, lower extremities warm to touch. Neurologic: Non Focal.   Skin: No Rash or lesions   Wounds: N/A.    LABORATORY DATA: CBC:  Recent Labs Lab 10/21/15 2022 10/22/15 0527 10/23/15 0848 10/24/15 0345  WBC 3.9* 3.1* 3.1* 3.0*  NEUTROABS 2.6  --   --   --   HGB 10.8* 10.2* 10.3* 9.9*  HCT 33.4* 31.3* 32.6* 31.5*  MCV 93.6 92.6 93.4 96.6  PLT 110* 94* 91* 89*    Basic Metabolic Panel:  Recent Labs Lab  10/22/15 0525  10/22/15 1334 10/23/15 0848 10/24/15 0345 10/25/15 0440 10/26/15 0437  NA  --   < > 131* 130* 134* 135 135  K  --   < > 3.9 4.5 3.9 3.3* 3.3*  CL  --   < > 92* 98* 102 101 99*  CO2  --   < > 31 25 28 29  33*  GLUCOSE  --   < > 301* 328* 208* 166* 188*  BUN  --   < > <5* <5* 6 8 12   CREATININE  --   < > 0.35* 0.43* 0.35* <0.30* 0.53  CALCIUM  --   < > 8.4* 8.6* 8.5* 8.6* 8.9  MG 1.4*  --   --   --   --  1.6*  --   < > = values in this interval not displayed.  GFR: Estimated Creatinine Clearance: 57.9 mL/min (by C-G formula based on Cr of 0.53).  Liver Function Tests:  Recent Labs Lab 10/21/15 2022 10/23/15 0848 10/24/15 0345  AST 83* 144* 155*  ALT 58* 60* 66*  ALKPHOS 154* 132* 135*  BILITOT 1.6* 1.8* 1.0  PROT 8.0 7.4 6.7  ALBUMIN 3.0* 3.6 3.1*    Recent Labs Lab 10/21/15 2022  LIPASE 71*   No results for input(s): AMMONIA in the last 168 hours.  Coagulation Profile:  Recent Labs Lab 10/21/15 2022  INR 1.30    Cardiac Enzymes: No results for input(s): CKTOTAL, CKMB, CKMBINDEX, TROPONINI in the last 168 hours.  BNP (last 3 results) No results for input(s): PROBNP in the last 8760 hours.  HbA1C: No results for input(s): HGBA1C in the last 72 hours.  CBG:  Recent Labs Lab 10/25/15 0735 10/25/15 1137 10/25/15 1700 10/25/15 2200 10/26/15 0739  GLUCAP 139* 359* 126* 219* 180*    Lipid Profile: No results for input(s): CHOL, HDL, LDLCALC, TRIG, CHOLHDL, LDLDIRECT in the last 72 hours.  Thyroid Function Tests: No results for input(s): TSH, T4TOTAL, FREET4, T3FREE, THYROIDAB in the last 72 hours.  Anemia Panel: No results for input(s): VITAMINB12, FOLATE, FERRITIN, TIBC, IRON, RETICCTPCT in the last 72 hours.  Urine analysis:    Component Value Date/Time   COLORURINE YELLOW 10/21/2015 2041   APPEARANCEUR CLEAR 10/21/2015 2041   LABSPEC 1.041* 10/21/2015 2041   PHURINE 6.0 10/21/2015 2041   GLUCOSEU >1000* 10/21/2015 2041    GLUCOSEU >=1000* 07/18/2014 0943   HGBUR NEGATIVE 10/21/2015 2041   BILIRUBINUR NEGATIVE 10/21/2015 2041   BILIRUBINUR neg 05/19/2013 0855   KETONESUR 40* 10/21/2015 2041   PROTEINUR NEGATIVE 10/21/2015 2041   PROTEINUR neg 05/19/2013 0855   UROBILINOGEN 1.0 03/02/2015 1041   UROBILINOGEN 0.2 05/19/2013 0855   NITRITE NEGATIVE 10/21/2015 2041   NITRITE neg 05/19/2013 0855   LEUKOCYTESUR NEGATIVE 10/21/2015 2041    Sepsis Labs: Lactic Acid, Venous    Component Value Date/Time   LATICACIDVEN 1.6 10/23/2015 1129    MICROBIOLOGY: Recent Results (from the past 240 hour(s))  MRSA PCR Screening     Status: None   Collection Time: 10/22/15  1:35 AM  Result Value Ref Range Status   MRSA by PCR NEGATIVE NEGATIVE Final    Comment:        The GeneXpert MRSA Assay (FDA approved for NASAL specimens only), is one component of a comprehensive MRSA colonization surveillance program. It is not intended to diagnose MRSA infection nor to guide or monitor treatment for MRSA infections.   Culture, blood (x 2)     Status: None (Preliminary result)   Collection Time: 10/22/15  1:52 AM  Result Value Ref Range Status   Specimen Description BLOOD LEFT ARM  Final   Special Requests BOTTLES DRAWN AEROBIC AND ANAEROBIC 5CC  Final   Culture   Final    NO GROWTH 3 DAYS Performed at [facility]    Report Status PENDING  Incomplete  Culture, blood (x 2)     Status: None (Preliminary result)   Collection Time: 10/22/15  1:58 AM  Result Value Ref Range Status   Specimen Description BLOOD LEFT ARM  Final   Special Requests BOTTLES DRAWN AEROBIC AND ANAEROBIC 10CC  Final   Culture   Final    NO GROWTH 3 DAYS Performed at [facility]    Report  Status PENDING  Incomplete  Culture, body fluid-bottle     Status: None (Preliminary result)   Collection Time: 10/22/15  9:30 AM  Result Value Ref Range Status   Specimen Description FLUID ABDOMEN  Final   Special Requests NONE  Final    Culture   Final    NO GROWTH 3 DAYS Performed at [facility]    Report Status PENDING  Incomplete  Gram stain     Status: None   Collection Time: 10/22/15  9:30 AM  Result Value Ref Range Status   Specimen Description FLUID ABDOMEN  Final   Special Requests NONE  Final   Gram Stain   Final    MODERATE WBC PRESENT,BOTH PMN AND MONONUCLEAR NO ORGANISMS SEEN Performed at [facility]    Report Status 10/22/2015 FINAL  Final    RADIOLOGY STUDIES/RESULTS: Ct Abdomen Pelvis W Contrast  10/21/2015  CLINICAL DATA:  Abdominal pain in a patient with a history of alcoholic cirrhosis, hepatitis-C and chronic pancreatitis. EXAM: CT ABDOMEN AND PELVIS WITH CONTRAST TECHNIQUE: Multidetector CT imaging of the abdomen and pelvis was performed using the standard protocol following bolus administration of intravenous contrast. CONTRAST:  21mL ISOVUE-300 IOPAMIDOL (ISOVUE-300) INJECTION 61% COMPARISON:  03/30/2015 FINDINGS: Lower chest:  Dependent atelectasis. Hepatobiliary: Markedly nodular liver contour compatible with reported history of cirrhosis. No evidence for enhancing lesion within the hepatic parenchyma 5 mm gallstone identified in the gallbladder. No intrahepatic or extrahepatic biliary dilation. Pancreas: No focal mass lesion. No dilatation of the main duct. No intraparenchymal cyst. No peripancreatic edema. Spleen: Spleen is 14 cm cranial caudal length, upper normal Adrenals/Urinary Tract: No adrenal nodule or mass. Kidneys are unremarkable. No evidence of hydroureter. The urinary bladder appears normal for the degree of distention. Stomach/Bowel: Paraesophageal varices noted. Stomach is nondistended. No gastric wall thickening. No evidence of outlet obstruction. Duodenum is normally positioned as is the ligament of Treitz. No small bowel wall thickening. No small bowel dilatation. The terminal ileum is normal. Nonvisualization of the appendix is consistent with the reported history of  appendectomy. No gross colonic mass. No colonic wall thickening. No substantial diverticular change. Vascular/Lymphatic: No abdominal aortic aneurysm. No abdominal aortic atherosclerotic calcification. Previously measured hepatoduodenal ligament lymph node is not evident on today's study. No retroperitoneal lymphadenopathy. No pelvic sidewall lymphadenopathy. Reproductive: Uterus surgically absent.  There is no adnexal mass. Other: Moderate volume ascites not substantially changed in the interval. Musculoskeletal: Bone windows reveal no worrisome lytic or sclerotic osseous lesions. IMPRESSION: 1. Stable exam.  No new or acute interval findings. 2. Cirrhotic changes in the liver as before with borderline splenomegaly and paraesophageal varices, features suggesting portal venous hypertension. 3. Cholelithiasis. 4. Moderate volume ascites, not substantially changed in the interval. Electronically Signed   By: Misty Stanley M.D.   On: 10/21/2015 23:28   US Paracentesis  10/24/2015  INDICATION: Hepatitis-C with a history of cirrhosis. Recurrent abdominal ascites. Request made for therapeutic paracentesis. EXAM: ULTRASOUND GUIDED THERAPEUTIC PARACENTESIS MEDICATIONS: 1% lidocaine COMPLICATIONS: None immediate. PROCEDURE: Informed written consent was obtained from the patient after a discussion of the risks, benefits and alternatives to treatment. A timeout was performed prior to the initiation of the procedure. Initial ultrasound scanning demonstrates a small amount of ascites within the right upper abdominal quadrant. The right upper abdomen was prepped and draped in the usual sterile fashion. 1% lidocaine was used for local anesthesia. Following this, a 19 gauge, 7-cm, Yueh catheter was introduced. An ultrasound image was saved for documentation  purposes. The paracentesis was performed. The catheter was removed and a dressing was applied. The patient tolerated the procedure well without immediate post procedural  complication. FINDINGS: A total of approximately 1.9 L of clear yellow fluid was removed. IMPRESSION: Successful ultrasound-guided paracentesis yielding 1.9 liters of peritoneal fluid. Read by: Saverio Danker, PA-C Electronically Signed   By: Corrie Mckusick D.O.   On: 10/24/2015 12:02   US Paracentesis  10/22/2015  INDICATION: Hepatitis-C. Cirrhosis. Recurrent ascites. Request for diagnostic and therapeutic paracentesis. EXAM: ULTRASOUND GUIDED RIGHT LATERAL ABDOMEN PARACENTESIS MEDICATIONS: 1% Lidocaine. COMPLICATIONS: None immediate. PROCEDURE: Informed written consent was obtained from the patient after a discussion of the risks, benefits and alternatives to treatment. A timeout was performed prior to the initiation of the procedure. Initial ultrasound scanning demonstrates a moderate amount of ascites within the right lower abdominal quadrant. The right lower abdomen was prepped and draped in the usual sterile fashion. 1% lidocaine with epinephrine was used for local anesthesia. Following this, a 19 gauge, 7-cm, Yueh catheter was introduced. An ultrasound image was saved for documentation purposes. The paracentesis was performed. The catheter was removed and a dressing was applied. The patient tolerated the procedure well without immediate post procedural complication. FINDINGS: A total of approximately 1.4 liters of clear yellow fluid was removed. Samples were sent to the laboratory as requested by the clinical team. IMPRESSION: Successful ultrasound-guided paracentesis yielding 1.4 liters of peritoneal fluid. Read by:  Gareth Eagle, PA-C Electronically Signed   By: Lucrezia Europe M.D.   On: 10/22/2015 09:33     LOS: 4 days   Oren Binet, MD  [facility] Pager:[phone]  If 7PM-7AM, please contact night-coverage [email or web address] Password [email or web address] 10/26/2015, 11:24 AM

## 2015-10-26 NOTE — Progress Notes (Signed)
Progress Note   Subjective   Sleeping, awakens easily- grimaces- abdomen about the same  Has been urinating a lot with IV lasix- weight 96 today   Objective   Vital signs in last 24 hours: Temp:  [97.6 F (36.4 C)-98.7 F (37.1 C)] 98.1 F (36.7 C) (07/06 0804) Pulse Rate:  [84-96] 93 (07/06 0804) Resp:  [12-20] 17 (07/06 0804) BP: (94-108)/(54-81) 108/81 mmHg (07/06 0804) SpO2:  [97 %-99 %] 99 % (07/06 0804) Weight:  [96 lb 3.2 oz (43.636 kg)] 96 lb 3.2 oz (43.636 kg) (07/06 0606) Last BM Date: 10/25/15 General:  Chronically ill appearing   white female in NAD Heart:  Regular rate and rhythm; no murmurs Lungs: Respirations even and unlabored, lungs CTA bilaterally Abdomen:  Tight ascites, diffusely tender  Bs + Extremities:  Without edema. Neurologic:  Alert and oriented,  grossly normal neurologically. Psych:  Cooperative. Normal mood and affect.  Intake/Output from previous day: 07/05 0701 - 07/06 0700 In: 50 [IV Piggyback:50] Out: -  Intake/Output this shift: Total I/O In: 240 [P.O.:240] Out: -   Lab Results:  Recent Labs  10/24/15 0345  WBC 3.0*  HGB 9.9*  HCT 31.5*  PLT 89*   BMET  Recent Labs  10/24/15 0345 10/25/15 0440 10/26/15 0437  NA 134* 135 135  K 3.9 3.3* 3.3*  CL 102 101 99*  CO2 28 29 33*  GLUCOSE 208* 166* 188*  BUN 6 8 12   CREATININE 0.35* <0.30* 0.53  CALCIUM 8.5* 8.6* 8.9   LFT  Recent Labs  10/24/15 0345  PROT 6.7  ALBUMIN 3.1*  AST 155*  ALT 66*  ALKPHOS 135*  BILITOT 1.0   PT/INR No results for input(s): LABPROT, INR in the last 72 hours.  Studies/Results: US Paracentesis  10/24/2015  INDICATION: Hepatitis-C with a history of cirrhosis. Recurrent abdominal ascites. Request made for therapeutic paracentesis. EXAM: ULTRASOUND GUIDED THERAPEUTIC PARACENTESIS MEDICATIONS: 1% lidocaine COMPLICATIONS: None immediate. PROCEDURE: Informed written consent was obtained from the patient after a discussion of the risks,  benefits and alternatives to treatment. A timeout was performed prior to the initiation of the procedure. Initial ultrasound scanning demonstrates a small amount of ascites within the right upper abdominal quadrant. The right upper abdomen was prepped and draped in the usual sterile fashion. 1% lidocaine was used for local anesthesia. Following this, a 19 gauge, 7-cm, Yueh catheter was introduced. An ultrasound image was saved for documentation purposes. The paracentesis was performed. The catheter was removed and a dressing was applied. The patient tolerated the procedure well without immediate post procedural complication. FINDINGS: A total of approximately 1.9 L of clear yellow fluid was removed. IMPRESSION: Successful ultrasound-guided paracentesis yielding 1.9 liters of peritoneal fluid. Read by: Saverio Danker, PA-C Electronically Signed   By: Corrie Mckusick D.O.   On: 10/24/2015 12:02       Assessment / Plan:    #1 50 yo female with decompensated cirrhosis , and developing refractory ascites ,admitted with increase in abdominal  pain, and distention past week or so - abdomen remains quite tight despite small volume paracentesis x 2 since admit  Pt is scheduled for large volume paracentesis today/albumin post procedure Continue IV lasix -will decrease today  Watch renal fxn Will discuss pain med issue with Dr Carlean Purl - he has been giving her hydrocodone -will determine plan moving forward  Principal Problem:   Abdominal pain Active Problems:   Alcohol dependence ([place])   Cocaine abuse   MDD (major depressive  disorder) ([place])   Ascites   Protein-calorie malnutrition, severe ([facility])   GERD (gastroesophageal reflux disease)   Hyponatremia   SBP (spontaneous bacterial peritonitis) ([place])   Sepsis ([facility])   Pancytopenia ([place])   Diabetes mellitus type II, uncontrolled ([place])     LOS: 4 days   Amy Esterwood  10/26/2015, 10:02 AM   GI ATTENDING  Interval history data reviewed. Patient seen and  examined. Agree with interval progress note as outlined above. Await paracentesis.  Docia Chuck. Geri Seminole., M.D. [facility] Division of Gastroenterology

## 2015-10-27 DIAGNOSIS — K7030 Alcoholic cirrhosis of liver without ascites: Secondary | ICD-10-CM

## 2015-10-27 LAB — GLUCOSE, CAPILLARY
GLUCOSE-CAPILLARY: 216 mg/dL — AB (ref 65–99)
GLUCOSE-CAPILLARY: 378 mg/dL — AB (ref 65–99)

## 2015-10-27 LAB — BASIC METABOLIC PANEL
Anion gap: 5 (ref 5–15)
BUN: 13 mg/dL (ref 6–20)
CALCIUM: 9.2 mg/dL (ref 8.9–10.3)
CHLORIDE: 97 mmol/L — AB (ref 101–111)
CO2: 32 mmol/L (ref 22–32)
CREATININE: 0.48 mg/dL (ref 0.44–1.00)
GFR calc Af Amer: >60 mL/min (ref >60)
GFR calc non Af Amer: >60 mL/min (ref >60)
GLUCOSE: 250 mg/dL — AB (ref 65–99)
Potassium: 3.4 mmol/L — ABNORMAL LOW (ref 3.5–5.1)
Sodium: 134 mmol/L — ABNORMAL LOW (ref 135–145)

## 2015-10-27 MED ORDER — INSULIN GLARGINE 100 UNIT/ML ~~LOC~~ SOLN
32.0000 [IU] | Freq: Two times a day (BID) | SUBCUTANEOUS | Status: DC
Start: 2015-10-27 — End: 2015-11-26

## 2015-10-27 MED ORDER — INSULIN ASPART 100 UNIT/ML FLEXPEN: 9.0000 [IU] | PEN_INJECTOR | Freq: Three times a day (TID) | SUBCUTANEOUS | Status: DC

## 2015-10-27 NOTE — Progress Notes (Signed)
Spoke with at bedside, states she plans to stay with her mother after d/c, husband has traveled to [place]. Patient has access to medications, transportation and Network engineer is scheduling a f/u appt for her. No needs identified.

## 2015-10-27 NOTE — Progress Notes (Addendum)
Inpatient Diabetes Program Recommendations  AACE/ADA: New Consensus Statement on Inpatient Glycemic Control (2015)  Target Ranges:  Prepandial:   less than 140 mg/dL      Peak postprandial:   less than 180 mg/dL (1-2 hours)      Critically ill patients:  140 - 180 mg/dL   Results for [name], [name] (MRN [id number]) as of 10/27/2015 09:35  Ref. Range 10/26/2015 07:39 10/26/2015 11:43 10/26/2015 13:55 10/26/2015 16:48 10/26/2015 20:46  Glucose-Capillary Latest Ref Range: 65-99 mg/dL 180 (H) 413 (H) 279 (H) 205 (H) 135 (H)   Results for [name], [name] (MRN [id number]) as of 10/27/2015 09:35  Ref. Range 10/27/2015 07:18  Glucose-Capillary Latest Ref Range: 65-99 mg/dL 216 (H)   Admit with: SBP/ Sepsis/ DKA  History: DM, ETOH, Cirrhosis  Home DM Meds: Lantus 40 units QHS  Novolog 4 units tidwc  Metformin 500 mg bid  Current Insulin Orders: Lantus 30 units bid  Novolog Sensitive Correction Scale/ SSI (0-9 units) TID AC + HS  Novolog 9 units tidwc     MD- Please consider the following in-hospital insulin adjustments:  1. Increase Lantus to 32 units bid  2. Increase Novolog Meal Coverage further to 10 units tid with meals     --Will follow patient during hospitalization--  Wyn Quaker RN, MSN, CDE Diabetes Coordinator Inpatient Glycemic Control Team Team Pager: [phone] (8a-5p)

## 2015-10-27 NOTE — Progress Notes (Signed)
Progress Note   Subjective   Feels about the same no change  [place] yesterday showed no significant ascites- so no paracentesis    Objective   Vital signs in last 24 hours: Temp:  [98 F (36.7 C)-98.5 F (36.9 C)] 98 F (36.7 C) (07/07 0815) Pulse Rate:  [84-95] 95 (07/07 0815) Resp:  [16-18] 16 (07/07 0815) BP: (98-106)/(58-79) 104/79 mmHg (07/07 0815) SpO2:  [95 %-99 %] 96 % (07/07 0815) Weight:  [99 lb 8 oz (45.133 kg)] 99 lb 8 oz (45.133 kg) (07/07 0428) Last BM Date: 10/26/15 General:    white female in NAD.chronically ill appearing Heart:  Regular rate and rhythm; no murmurs Lungs: Respirations even and unlabored, lungs CTA bilaterally Abdomen:  Soft, distended., diffusely tender  Normal bowel sounds. Extremities:  Without edema. Neurologic:  Alert and oriented,  grossly normal neurologically. Psych:  Cooperative. Normal mood and affect.  Intake/Output from previous day: 07/06 0701 - 07/07 0700 In: 51 [P.O.:960] Out: 201 [Urine:200; Stool:1] Intake/Output this shift: Total I/O In: 240 [P.O.:240] Out: -   Lab Results: No results for input(s): WBC, HGB, HCT, PLT in the last 72 hours. BMET  Recent Labs  10/25/15 0440 10/26/15 0437 10/26/15 1200 10/27/15 0421  NA 135 135  --  134*  K 3.3* 3.3*  --  3.4*  CL 101 99*  --  97*  CO2 29 33*  --  32  GLUCOSE 166* 188* 427* 250*  BUN 8 12  --  13  CREATININE <0.30* 0.53  --  0.48  CALCIUM 8.6* 8.9  --  9.2   LFT No results for input(s): PROT, ALBUMIN, AST, ALT, ALKPHOS, BILITOT, BILIDIR, IBILI in the last 72 hours. PT/INR No results for input(s): LABPROT, INR in the last 72 hours.  Studies/Results: US Abdomen Limited  10/26/2015  CLINICAL DATA:  Evaluate for ascites EXAM: LIMITED ABDOMEN ULTRASOUND FOR ASCITES TECHNIQUE: Limited ultrasound survey for ascites was performed in all four abdominal quadrants. COMPARISON:  10/24/2015 FINDINGS: A small volume of free fluid is identified within the right upper  quadrant of the abdomen. Insufficient volume for paracentesis. IMPRESSION: Small volume of ascites noted. Electronically Signed   By: Kerby Moors M.D.   On: 10/26/2015 11:43       Assessment / Plan:    #1 50 yo female with  Decompensated cirrhosis ETOH/Hep C with hx varices, and ascites  Her c/o of abdominal  pain felt out of proportion to findings and asks again today fro Rx for pain meds Reviewed her CT yesterday and liver and spleen are both large and filling most of upper abdomen which is causing her distention and discomfort Ok for D/C today - will arrange follow up with Dr Carlean Purl - pt was told we would not RX any more pain meds- she may use regular strength Tylenol sparingly- advised to get established with new PCP Needs to adhere to diabetic and 2 gm sodium diet   D/C on previous dose of Lasix and Aldactone  Made pt an appt with Dr Carlean Purl for 8/23- at 3:30 pm  Principal Problem:   Abdominal pain Active Problems:   Alcohol dependence ([place])   Cocaine abuse   MDD (major depressive disorder) ([place])   Ascites   Protein-calorie malnutrition, severe ([place])   GERD (gastroesophageal reflux disease)   Hyponatremia   SBP (spontaneous bacterial peritonitis) ([place])   Sepsis ([place])   Pancytopenia ([place])   Diabetes mellitus type II, uncontrolled ([place])     LOS:  5 days   Amy Esterwood  10/27/2015, 9:11 AM  GI ATTENDING  Interval history data reviewed. Agree with interval progress note as outlined above. No additional recommendations to those listed above. Okay for discharge with GI follow-up arranged.  Docia Chuck. Geri [place]in[place]., M.D. [facility] Division of Gastroenterology

## 2015-10-27 NOTE — Discharge Summary (Signed)
PATIENT DETAILS Name: [name] Age: 50 y.o. Sex: female Date of Birth: 02-25-1966 MRN: [id number]. Admitting Physician: Meredith Costa, MD [id number] Meredith Reichmann, MD  Admit Date: 10/21/2015 Discharge date: 10/27/2015  Recommendations for Outpatient Follow-up:  1. Suggest referral to pain management clinic-has chronic abdominal pain.  2. Please repeat CBC/BMET at next visit   PRIMARY DISCHARGE DIAGNOSIS:  Principal Problem:   Abdominal pain Active Problems:   Alcohol dependence ([facility])   Cocaine abuse   MDD (major depressive disorder) ([facility])   Ascites   Protein-calorie malnutrition, severe ([facility])   GERD (gastroesophageal reflux disease)   Hyponatremia   SBP (spontaneous bacterial peritonitis) ([facility])   Sepsis (H[facility])   Pancytopenia (H[facility])   Diabetes mellitus type II, uncontrolled ([place])      PAST MEDICAL HISTORY: Past Medical History  Diagnosis Date  . Impaired glucose tolerance 11/07/2010  . Hepatitis C approx dx 2005    no tx to date  . Pancreatitis     HX of  . Alcohol dependency ([place])     at least [facility] admissions 2007- 09/2013 for detox.   Marland Kitchen GERD (gastroesophageal reflux disease)   . Chronic constipation   . Gallstones   . Drug dependency ([place])     Hx of cocaine use  . Anxiety and depression 08/2011  . Rheumatoid factor positive   . Hemochromatosis     last phlebotomy tx ~ 2012. liver bx 2006  . Pancytopenia   . Abnormal vaginal bleeding     uterine fibroid  . Gallstone 2006  . Chronic abdominal pain 2011  . Cirrhosis ([place]) 11/09/2010  . Type II or unspecified type diabetes mellitus without mention of complication, uncontrolled 09/17/2011  . Major depression ([place]) 11/2012, 09/2013    [facility] admissions for this, suicide attempt by OD Ambien (2014), Prozac (2015),ETOHism   . Anxiety     DISCHARGE MEDICATIONS: Current Discharge Medication List    CONTINUE these medications which have CHANGED   Details  insulin aspart (NOVOLOG FLEXPEN) 100 UNIT/ML FlexPen Inject 9 Units into the  skin 3 (three) times daily with meals. Qty: 30 mL, Refills: 0    insulin glargine (LANTUS) 100 UNIT/ML injection Inject 0.32 mLs (32 Units total) into the skin 2 (two) times daily. INJECT 0.4 MLS (40 UNITS TOTAL) INTO THE SKIN AT BEDTIME. Qty: 20 mL, Refills: 0      CONTINUE these medications which have NOT CHANGED   Details  atenolol (TENORMIN) 25 MG tablet Take 0.5 tablets (12.5 mg total) by mouth daily. Qty: 30 tablet, Refills: 1    calcium carbonate (TUMS EX) 750 MG chewable tablet Chew 3 tablets by mouth 4 (four) times daily as needed for heartburn.    folic acid (FOLVITE) 1 MG tablet Take 1 tablet (1 mg total) by mouth daily. Qty: 30 tablet, Refills: 0    furosemide (LASIX) 40 MG tablet Take 2 tablets (80 mg total) by mouth daily. Qty: 60 tablet, Refills: 1    Insulin Pen Needle (BD PEN NEEDLE NANO U/F) 32G X 4 MM MISC Use as needed to inject insulin as prescribed Qty: 300 each, Refills: 1    KLOR-CON M10 10 MEQ tablet Take 1 tablet (10 mEq total) by mouth daily. Qty: 30 tablet, Refills: 1    lactulose (CHRONULAC) 10 GM/15ML solution Take 30 mLs (20 g total) by mouth 3 (three) times daily. Hold for the day after you have 3 soft stool/day. Qty: 240 mL, Refills: 11    metFORMIN (GLUCOPHAGE) 500 MG tablet  TAKE 1 TABLET (500 MG TOTAL) BY MOUTH 2 (TWO) TIMES DAILY WITH A MEAL. Qty: 180 tablet, Refills: 1    Multiple Vitamin (MULTIVITAMIN WITH MINERALS) TABS tablet Take 1 tablet by mouth daily.     pantoprazole (PROTONIX) 40 MG tablet Take 1 tablet (40 mg total) by mouth 2 (two) times daily. Qty: 60 tablet, Refills: 1    sertraline (ZOLOFT) 100 MG tablet Take 150 mg by mouth daily.    spironolactone (ALDACTONE) 100 MG tablet Take 2 tablets (200 mg total) by mouth daily. Qty: 60 tablet, Refills: 1    thiamine 100 MG tablet Take 1 tablet (100 mg total) by mouth daily. Qty: 30 tablet, Refills: 0    diphenhydrAMINE (BENADRYL) 25 MG tablet Take 4 tablets (100 mg total) by  mouth at bedtime as needed for sleep.    nystatin cream (MYCOSTATIN) Apply topically 3 (three) times daily. Apply to groin area and inner thighs Qty: 30 g, Refills: 0    oxyCODONE (OXY IR/ROXICODONE) 5 MG immediate release tablet Take 1 tablet (5 mg total) by mouth every 6 (six) hours as needed for severe pain. Qty: 10 tablet, Refills: 0    rifaximin (XIFAXAN) 550 MG TABS tablet Take 1 tablet (550 mg total) by mouth 2 (two) times daily. Qty: 60 tablet, Refills: 0        ALLERGIES:   Allergies  Allergen Reactions  . Other Itching    CHG WIPES DONE, PT. STARTED HAVING SEVERE ITCHING ALL OVER. NO RASH OR CHANGE IN SKIN COLOR. NO OTHER CAUSE FOR THE ITCHING IDENTIFIED  . Betadine [Povidone Iodine] Hives    BRIEF HPI:  See H&P, Labs, Consult and Test reports for all details in brief, patient was admitted for Evaluation of abdominal pain and distention.  CONSULTATIONS:   GI  PERTINENT RADIOLOGIC STUDIES: Ct Abdomen Pelvis W Contrast  10/21/2015  CLINICAL DATA:  Abdominal pain in a patient with a history of alcoholic cirrhosis, hepatitis-C and chronic pancreatitis. EXAM: CT ABDOMEN AND PELVIS WITH CONTRAST TECHNIQUE: Multidetector CT imaging of the abdomen and pelvis was performed using the standard protocol following bolus administration of intravenous contrast. CONTRAST:  53mL ISOVUE-300 IOPAMIDOL (ISOVUE-300) INJECTION 61% COMPARISON:  03/30/2015 FINDINGS: Lower chest:  Dependent atelectasis. Hepatobiliary: Markedly nodular liver contour compatible with reported history of cirrhosis. No evidence for enhancing lesion within the hepatic parenchyma 5 mm gallstone identified in the gallbladder. No intrahepatic or extrahepatic biliary dilation. Pancreas: No focal mass lesion. No dilatation of the main duct. No intraparenchymal cyst. No peripancreatic edema. Spleen: Spleen is 14 cm cranial caudal length, upper normal Adrenals/Urinary Tract: No adrenal nodule or mass. Kidneys are unremarkable. No  evidence of hydroureter. The urinary bladder appears normal for the degree of distention. Stomach/Bowel: Paraesophageal varices noted. Stomach is nondistended. No gastric wall thickening. No evidence of outlet obstruction. Duodenum is normally positioned as is the ligament of Treitz. No small bowel wall thickening. No small bowel dilatation. The terminal ileum is normal. Nonvisualization of the appendix is consistent with the reported history of appendectomy. No gross colonic mass. No colonic wall thickening. No substantial diverticular change. Vascular/Lymphatic: No abdominal aortic aneurysm. No abdominal aortic atherosclerotic calcification. Previously measured hepatoduodenal ligament lymph node is not evident on today's study. No retroperitoneal lymphadenopathy. No pelvic sidewall lymphadenopathy. Reproductive: Uterus surgically absent.  There is no adnexal mass. Other: Moderate volume ascites not substantially changed in the interval. Musculoskeletal: Bone windows reveal no worrisome lytic or sclerotic osseous lesions. IMPRESSION: 1. Stable exam.  No new  or acute interval findings. 2. Cirrhotic changes in the liver as before with borderline splenomegaly and paraesophageal varices, features suggesting portal venous hypertension. 3. Cholelithiasis. 4. Moderate volume ascites, not substantially changed in the interval. Electronically Signed   By: Misty Stanley M.D.   On: 10/21/2015 23:28   US Abdomen Limited  10/26/2015  CLINICAL DATA:  Evaluate for ascites EXAM: LIMITED ABDOMEN ULTRASOUND FOR ASCITES TECHNIQUE: Limited ultrasound survey for ascites was performed in all four abdominal quadrants. COMPARISON:  10/24/2015 FINDINGS: A small volume of free fluid is identified within the right upper quadrant of the abdomen. Insufficient volume for paracentesis. IMPRESSION: Small volume of ascites noted. Electronically Signed   By: Kerby Moors M.D.   On: 10/26/2015 11:43   US Paracentesis  10/24/2015  INDICATION:  Hepatitis-C with a history of cirrhosis. Recurrent abdominal ascites. Request made for therapeutic paracentesis. EXAM: ULTRASOUND GUIDED THERAPEUTIC PARACENTESIS MEDICATIONS: 1% lidocaine COMPLICATIONS: None immediate. PROCEDURE: Informed written consent was obtained from the patient after a discussion of the risks, benefits and alternatives to treatment. A timeout was performed prior to the initiation of the procedure. Initial ultrasound scanning demonstrates a small amount of ascites within the right upper abdominal quadrant. The right upper abdomen was prepped and draped in the usual sterile fashion. 1% lidocaine was used for local anesthesia. Following this, a 19 gauge, 7-cm, Yueh catheter was introduced. An ultrasound image was saved for documentation purposes. The paracentesis was performed. The catheter was removed and a dressing was applied. The patient tolerated the procedure well without immediate post procedural complication. FINDINGS: A total of approximately 1.9 L of clear yellow fluid was removed. IMPRESSION: Successful ultrasound-guided paracentesis yielding 1.9 liters of peritoneal fluid. Read by: Saverio Danker, PA-C Electronically Signed   By: Corrie Mckusick D.O.   On: 10/24/2015 12:02   US Paracentesis  10/22/2015  INDICATION: Hepatitis-C. Cirrhosis. Recurrent ascites. Request for diagnostic and therapeutic paracentesis. EXAM: ULTRASOUND GUIDED RIGHT LATERAL ABDOMEN PARACENTESIS MEDICATIONS: 1% Lidocaine. COMPLICATIONS: None immediate. PROCEDURE: Informed written consent was obtained from the patient after a discussion of the risks, benefits and alternatives to treatment. A timeout was performed prior to the initiation of the procedure. Initial ultrasound scanning demonstrates a moderate amount of ascites within the right lower abdominal quadrant. The right lower abdomen was prepped and draped in the usual sterile fashion. 1% lidocaine with epinephrine was used for local anesthesia. Following  this, a 19 gauge, 7-cm, Yueh catheter was introduced. An ultrasound image was saved for documentation purposes. The paracentesis was performed. The catheter was removed and a dressing was applied. The patient tolerated the procedure well without immediate post procedural complication. FINDINGS: A total of approximately 1.4 liters of clear yellow fluid was removed. Samples were sent to the laboratory as requested by the clinical team. IMPRESSION: Successful ultrasound-guided paracentesis yielding 1.4 liters of peritoneal fluid. Read by:  Gareth Eagle, PA-C Electronically Signed   By: Lucrezia Europe M.D.   On: 10/22/2015 09:33     PERTINENT LAB RESULTS: CBC: No results for input(s): WBC, HGB, HCT, PLT in the last 72 hours. CMET CMP     Component Value Date/Time   NA 134* 10/27/2015 0421   K 3.4* 10/27/2015 0421   CL 97* 10/27/2015 0421   CO2 32 10/27/2015 0421   GLUCOSE 250* 10/27/2015 0421   BUN 13 10/27/2015 0421   CREATININE 0.48 10/27/2015 0421   CALCIUM 9.2 10/27/2015 0421   PROT 6.7 10/24/2015 0345   ALBUMIN 3.1* 10/24/2015 0345   AST  155* 10/24/2015 0345   ALT 66* 10/24/2015 0345   ALKPHOS 135* 10/24/2015 0345   BILITOT 1.0 10/24/2015 0345   GFRNONAA >60 10/27/2015 0421   GFRAA >60 10/27/2015 0421    GFR Estimated Creatinine Clearance: 59.9 mL/min (by C-G formula based on Cr of 0.48). No results for input(s): LIPASE, AMYLASE in the last 72 hours. No results for input(s): CKTOTAL, CKMB, CKMBINDEX, TROPONINI in the last 72 hours. Invalid input(s): POCBNP No results for input(s): DDIMER in the last 72 hours. No results for input(s): HGBA1C in the last 72 hours. No results for input(s): CHOL, HDL, LDLCALC, TRIG, CHOLHDL, LDLDIRECT in the last 72 hours. No results for input(s): TSH, T4TOTAL, T3FREE, THYROIDAB in the last 72 hours.  Invalid input(s): FREET3 No results for input(s): VITAMINB12, FOLATE, FERRITIN, TIBC, IRON, RETICCTPCT in the last 72 hours. Coags: No results for  input(s): INR in the last 72 hours.  Invalid input(s): PT Microbiology: Recent Results (from the past 240 hour(s))  MRSA PCR Screening     Status: None   Collection Time: 10/22/15  1:35 AM  Result Value Ref Range Status   MRSA by PCR NEGATIVE NEGATIVE Final    Comment:        The GeneXpert MRSA Assay (FDA approved for NASAL specimens only), is one component of a comprehensive MRSA colonization surveillance program. It is not intended to diagnose MRSA infection nor to guide or monitor treatment for MRSA infections.   Culture, blood (x 2)     Status: None (Preliminary result)   Collection Time: 10/22/15  1:52 AM  Result Value Ref Range Status   Specimen Description BLOOD LEFT ARM  Final   Special Requests BOTTLES DRAWN AEROBIC AND ANAEROBIC 5CC  Final   Culture   Final    NO GROWTH 4 DAYS Performed at [facility]    Report Status PENDING  Incomplete  Culture, blood (x 2)     Status: None (Preliminary result)   Collection Time: 10/22/15  1:58 AM  Result Value Ref Range Status   Specimen Description BLOOD LEFT ARM  Final   Special Requests BOTTLES DRAWN AEROBIC AND ANAEROBIC 10CC  Final   Culture   Final    NO GROWTH 4 DAYS Performed at [facility]    Report Status PENDING  Incomplete  Culture, body fluid-bottle     Status: None (Preliminary result)   Collection Time: 10/22/15  9:30 AM  Result Value Ref Range Status   Specimen Description FLUID ABDOMEN  Final   Special Requests NONE  Final   Culture   Final    NO GROWTH 4 DAYS Performed at [facility]    Report Status PENDING  Incomplete  Gram stain     Status: None   Collection Time: 10/22/15  9:30 AM  Result Value Ref Range Status   Specimen Description FLUID ABDOMEN  Final   Special Requests NONE  Final   Gram Stain   Final    MODERATE WBC PRESENT,BOTH PMN AND MONONUCLEAR NO ORGANISMS SEEN Performed at [facility]    Report Status 10/22/2015 FINAL  Final     BRIEF HOSPITAL  COURSE:  Ascites: Underwent paracentesis 2, on Aldactone and Lasix. Seen by gastroenterology, Lasix converted to IV, recommendations are to pursue high-volume paracentesis-However upon further evaluation by interventional radiologysignificant ascites present to drain. No further recommendations from gastroenterology, okay to discharge on her usual diuretic regimen. Continue to follow with gastroenterology in the outpatient setting.   Abdominal  pain: Strongly suspect that some of this pain is chronic and she also has some component of narcotic seeking behavior. Some component of the pain probably was also related to tense ascites. CT scan abdomen negative for acute abnormalities. Peritoneal fluid analysis not consistent with SBP-hence intravenous ceftriaxone was discontinued. Since her pain is chronic, she is not being provided narcotic therapy on discharge, I have suggested that she follow-up with her PCP or pain management clinic. Furthermore, she does have a history of polysubstance abuse-intermittently uses cocaine, still intermittently consumes alcohol-and is at risk of polypharmacy.  Decompensated liver cirrhosis: Admitted with ascites, but otherwise appears stable. Seen by gastroenterology-recommendations are to resume usual medications on discharge. Outpatient follow-up with gastroenterology has been arranged. .  Alcohol dependence: Claims that she relapsed a few weeks back after being clean for approximately 6 months. No signs of withdrawal currently.  Type 2 diabetes: CBGs much more stable today, has changed regimen to Lantus 30 units twice a day along with scheduled pre-meal NovoLog. I suspect she is noncompliant to diet as his CBGs continue to fluctuate quite a lot during this hospital stay.  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 Continue diuretic regimen  Alcohol abuse: She claims she relapsed a few weeks  prior to this admission after not using alcohol for approximately 6 months. She she was counseled.  Cocaine abuse: Counseled-claims that her last cocaine use was 2-3 weeks. Claims she only uses it intermittently and is not a constant user.  TODAY-DAY OF DISCHARGE:  Subjective:   [name] today has no headache,no chest no new weakness tingling or numbness, feels much better wants to go home today. Her abdominal pain is slightly better compared to yesterday  Objective:   Blood pressure 104/79, pulse 95, temperature 98 F (36.7 C), temperature source Oral, resp. rate 16, height 5\' 2"  (1.575 m), weight 45.133 kg (99 lb 8 oz), last menstrual period 11/01/2011, SpO2 96 %.  Intake/Output Summary (Last 24 hours) at 10/27/15 1036 Last data filed at 10/27/15 0824  Gross per 24 hour  Intake    960 ml  Output    201 ml  Net    759 ml   Filed Weights   10/25/15 0506 10/26/15 0606 10/27/15 0428  Weight: 48.535 kg (107 lb) 43.636 kg (96 lb 3.2 oz) 45.133 kg (99 lb 8 oz)    Exam Awake Alert, Oriented *3, No new F.N deficits, Normal affect [place].AT,PERRAL Supple Neck,No JVD, No cervical lymphadenopathy appriciated.  Symmetrical Chest wall movement, Good air movement bilaterally, CTAB RRR,No Gallops,Rubs or new Murmurs, No Parasternal Heave +ve B.Sounds, Abd Soft, Mild tenderness present diffusely , No organomegaly appriciated, No rebound -guarding or rigidity. Some distention present but not tense. No Cyanosis, Clubbing or edema, No new Rash or bruise  DISCHARGE CONDITION: Stable  DISPOSITION: Home  DISCHARGE INSTRUCTIONS:    Activity:  As tolerated   Get Medicines reviewed and adjusted: Please take all your medications with you for your next visit with your Primary MD  Please request your Primary MD to go over all hospital tests and procedure/radiological results at the follow up, please ask your Primary MD to get all Hospital records sent to his/her office.  If you experience  worsening of your admission symptoms, develop shortness of breath, life threatening emergency, suicidal or homicidal thoughts you must seek medical attention immediately by calling 911 or calling your MD immediately  if symptoms less severe.  You must read complete  instructions/literature along with all the possible adverse reactions/side effects for all the Medicines you take and that have been prescribed to you. Take any new Medicines after you have completely understood and accpet all the possible adverse reactions/side effects.   Do not drive when taking Pain medications.   Do not take more than prescribed Pain, Sleep and Anxiety Medications  Special Instructions: If you have smoked or chewed Tobacco  in the last 2 yrs please stop smoking, stop any regular Alcohol  and or any Recreational drug use.  Wear Seat belts while driving.  Please note  You were cared for by a hospitalist during your hospital stay. Once you are discharged, your primary care physician will handle any further medical issues. Please note that NO REFILLS for any discharge medications will be authorized once you are discharged, as it is imperative that you return to your primary care physician (or establish a relationship with a primary care physician if you do not have one) for your aftercare needs so that they can reassess your need for medications and monitor your lab values.   Diet recommendation: Heart Healthy diet  Discharge Instructions    Call MD for:  difficulty breathing, headache or visual disturbances    Complete by:  As directed      Call MD for:  redness, tenderness, or signs of infection (pain, swelling, redness, odor or green/yellow discharge around incision site)    Complete by:  As directed      Call MD for:  temperature >100.4    Complete by:  As directed      Diet - low sodium heart healthy    Complete by:  As directed      Increase activity slowly    Complete by:  As directed             Follow-up Information    Follow up with Cathlean Cower, MD. Schedule an appointment as soon as possible for a visit in 1 week.   Specialties:  Internal Medicine, Radiology   Contact information:   [address] [place] [address] [phone]       Follow up with Silvano Rusk, MD On 12/13/2015.   Specialty:  Gastroenterology   Why:  appointment at 3:30 pm   Contact information:   [address] [place] [address] [phone]      Total Time spent on discharge equals 45 minutes.  SignedOren Binet 10/27/2015 10:36 AM

## 2015-10-28 LAB — CULTURE, BLOOD (ROUTINE X 2)
Culture: NO GROWTH
Culture: NO GROWTH

## 2015-10-28 LAB — CULTURE, BODY FLUID W GRAM STAIN -BOTTLE

## 2015-10-28 LAB — CULTURE, BODY FLUID-BOTTLE: CULTURE: NO GROWTH

## 2015-11-03 ENCOUNTER — Inpatient Hospital Stay: Payer: Self-pay | Admitting: Internal Medicine

## 2015-11-13 ENCOUNTER — Other Ambulatory Visit: Payer: Self-pay | Admitting: Internal Medicine

## 2015-11-21 ENCOUNTER — Other Ambulatory Visit: Payer: Self-pay | Admitting: Internal Medicine

## 2015-11-22 ENCOUNTER — Telehealth: Payer: Self-pay | Admitting: Internal Medicine

## 2015-11-22 MED ORDER — SPIRONOLACTONE 100 MG PO TABS: 200.0000 mg | ORAL_TABLET | Freq: Every day | ORAL | 1 refills | Status: DC

## 2015-11-22 NOTE — Telephone Encounter (Signed)
Ok to refill furosemide and spironolactone .  She should also be set up for an office visit.

## 2015-11-22 NOTE — Telephone Encounter (Signed)
Spoke with [name] and she said it's the spirolactone that she needs refilled.  Ok to refill per Barb Merino, RN, [place].  Sent in and she has appointment with Dr Carlean Purl 12/13/15 at 3:30pm.

## 2015-11-26 ENCOUNTER — Inpatient Hospital Stay (HOSPITAL_COMMUNITY)
Admission: EM | Admit: 2015-11-26 | Discharge: 2015-11-28 | DRG: 637 | Disposition: A | Discharge status: Home / Self Care | Payer: Medicaid Other | Attending: Internal Medicine | Admitting: Internal Medicine

## 2015-11-26 ENCOUNTER — Encounter (HOSPITAL_COMMUNITY): Payer: Self-pay

## 2015-11-26 ENCOUNTER — Emergency Department (HOSPITAL_COMMUNITY): Payer: Medicaid Other

## 2015-11-26 DIAGNOSIS — K652 Spontaneous bacterial peritonitis: Secondary | ICD-10-CM | POA: Diagnosis present

## 2015-11-26 DIAGNOSIS — D61818 Other pancytopenia: Secondary | ICD-10-CM | POA: Diagnosis present

## 2015-11-26 DIAGNOSIS — Z888 Allergy status to other drugs, medicaments and biological substances status: Secondary | ICD-10-CM

## 2015-11-26 DIAGNOSIS — D6959 Other secondary thrombocytopenia: Secondary | ICD-10-CM | POA: Diagnosis present

## 2015-11-26 DIAGNOSIS — F1010 Alcohol abuse, uncomplicated: Secondary | ICD-10-CM | POA: Diagnosis present

## 2015-11-26 DIAGNOSIS — E878 Other disorders of electrolyte and fluid balance, not elsewhere classified: Secondary | ICD-10-CM | POA: Diagnosis present

## 2015-11-26 DIAGNOSIS — K7031 Alcoholic cirrhosis of liver with ascites: Secondary | ICD-10-CM | POA: Diagnosis present

## 2015-11-26 DIAGNOSIS — E871 Hypo-osmolality and hyponatremia: Secondary | ICD-10-CM | POA: Diagnosis present

## 2015-11-26 DIAGNOSIS — R188 Other ascites: Secondary | ICD-10-CM

## 2015-11-26 DIAGNOSIS — Z833 Family history of diabetes mellitus: Secondary | ICD-10-CM

## 2015-11-26 DIAGNOSIS — F418 Other specified anxiety disorders: Secondary | ICD-10-CM

## 2015-11-26 DIAGNOSIS — E1100 Type 2 diabetes mellitus with hyperosmolarity without nonketotic hyperglycemic-hyperosmolar coma (NKHHC): Principal | ICD-10-CM | POA: Diagnosis present

## 2015-11-26 DIAGNOSIS — R1084 Generalized abdominal pain: Secondary | ICD-10-CM | POA: Diagnosis present

## 2015-11-26 DIAGNOSIS — F329 Major depressive disorder, single episode, unspecified: Secondary | ICD-10-CM | POA: Diagnosis present

## 2015-11-26 DIAGNOSIS — B182 Chronic viral hepatitis C: Secondary | ICD-10-CM | POA: Diagnosis present

## 2015-11-26 DIAGNOSIS — K219 Gastro-esophageal reflux disease without esophagitis: Secondary | ICD-10-CM | POA: Diagnosis present

## 2015-11-26 DIAGNOSIS — Z794 Long term (current) use of insulin: Secondary | ICD-10-CM

## 2015-11-26 DIAGNOSIS — Z8041 Family history of malignant neoplasm of ovary: Secondary | ICD-10-CM | POA: Diagnosis not present

## 2015-11-26 DIAGNOSIS — F419 Anxiety disorder, unspecified: Secondary | ICD-10-CM | POA: Diagnosis present

## 2015-11-26 DIAGNOSIS — Z765 Malingerer [conscious simulation]: Secondary | ICD-10-CM | POA: Diagnosis not present

## 2015-11-26 DIAGNOSIS — E119 Type 2 diabetes mellitus without complications: Secondary | ICD-10-CM

## 2015-11-26 DIAGNOSIS — E1101 Type 2 diabetes mellitus with hyperosmolarity with coma: Secondary | ICD-10-CM | POA: Diagnosis not present

## 2015-11-26 DIAGNOSIS — R109 Unspecified abdominal pain: Secondary | ICD-10-CM

## 2015-11-26 DIAGNOSIS — K319 Disease of stomach and duodenum, unspecified: Secondary | ICD-10-CM | POA: Diagnosis present

## 2015-11-26 DIAGNOSIS — Z79899 Other long term (current) drug therapy: Secondary | ICD-10-CM | POA: Diagnosis not present

## 2015-11-26 DIAGNOSIS — E1165 Type 2 diabetes mellitus with hyperglycemia: Secondary | ICD-10-CM | POA: Diagnosis present

## 2015-11-26 DIAGNOSIS — R000 Tachycardia, unspecified: Secondary | ICD-10-CM | POA: Diagnosis present

## 2015-11-26 LAB — URINE MICROSCOPIC-ADD ON

## 2015-11-26 LAB — COMPREHENSIVE METABOLIC PANEL
ALBUMIN: 3.1 g/dL — AB (ref 3.5–5.0)
ALK PHOS: 177 U/L — AB (ref 38–126)
ALT: 56 U/L — ABNORMAL HIGH (ref 14–54)
ANION GAP: 16 — AB (ref 5–15)
AST: 53 U/L — ABNORMAL HIGH (ref 15–41)
BILIRUBIN TOTAL: 1.7 mg/dL — AB (ref 0.3–1.2)
BUN: 12 mg/dL (ref 6–20)
CALCIUM: 9.4 mg/dL (ref 8.9–10.3)
CO2: 23 mmol/L (ref 22–32)
Chloride: 93 mmol/L — ABNORMAL LOW (ref 101–111)
Creatinine, Ser: 0.54 mg/dL (ref 0.44–1.00)
Glucose, Bld: 749 mg/dL (ref 65–99)
POTASSIUM: 3.9 mmol/L (ref 3.5–5.1)
Sodium: 132 mmol/L — ABNORMAL LOW (ref 135–145)
TOTAL PROTEIN: 8.1 g/dL (ref 6.5–8.1)

## 2015-11-26 LAB — CBC
HEMATOCRIT: 34.2 % — AB (ref 36.0–46.0)
HEMOGLOBIN: 10.8 g/dL — AB (ref 12.0–15.0)
MCH: 30.9 pg (ref 26.0–34.0)
MCHC: 31.6 g/dL (ref 30.0–36.0)
MCV: 98 fL (ref 78.0–100.0)
Platelets: 105 10*3/uL — ABNORMAL LOW (ref 150–400)
RBC: 3.49 MIL/uL — AB (ref 3.87–5.11)
RDW: 16.4 % — ABNORMAL HIGH (ref 11.5–15.5)
WBC: 3.7 10*3/uL — AB (ref 4.0–10.5)

## 2015-11-26 LAB — URINALYSIS, ROUTINE W REFLEX MICROSCOPIC
BILIRUBIN URINE: NEGATIVE
Glucose, UA: >1000 mg/dL — AB
Hgb urine dipstick: NEGATIVE
Ketones, ur: NEGATIVE mg/dL
LEUKOCYTES UA: NEGATIVE
NITRITE: NEGATIVE
PH: 6 (ref 5.0–8.0)
Protein, ur: NEGATIVE mg/dL
SPECIFIC GRAVITY, URINE: 1.033 — AB (ref 1.005–1.030)

## 2015-11-26 LAB — RAPID URINE DRUG SCREEN, HOSP PERFORMED
Amphetamines: NOT DETECTED
BARBITURATES: NOT DETECTED
Benzodiazepines: NOT DETECTED
COCAINE: NOT DETECTED
OPIATES: NOT DETECTED
Tetrahydrocannabinol: NOT DETECTED

## 2015-11-26 LAB — AMMONIA: Ammonia: 51 umol/L — ABNORMAL HIGH (ref 9–35)

## 2015-11-26 LAB — I-STAT TROPONIN, ED: TROPONIN I, POC: 0 ng/mL (ref 0.00–0.08)

## 2015-11-26 LAB — GLUCOSE, CAPILLARY: Glucose-Capillary: 498 mg/dL — ABNORMAL HIGH (ref 65–99)

## 2015-11-26 LAB — CBG MONITORING, ED: Glucose-Capillary: >600 mg/dL (ref 65–99)

## 2015-11-26 LAB — LIPASE, BLOOD: Lipase: 69 U/L — ABNORMAL HIGH (ref 11–51)

## 2015-11-26 MED ORDER — PANTOPRAZOLE SODIUM 40 MG PO TBEC
40.0000 mg | DELAYED_RELEASE_TABLET | Freq: Two times a day (BID) | ORAL | Status: DC
  Administered 2015-11-26 – 2015-11-28 (×4): 40 mg via ORAL
  Filled 2015-11-26 (×4): qty 1

## 2015-11-26 MED ORDER — SODIUM CHLORIDE 0.9 % IV SOLN
INTRAVENOUS | Status: DC
  Administered 2015-11-26: 23:00:00 via INTRAVENOUS

## 2015-11-26 MED ORDER — FUROSEMIDE 10 MG/ML IJ SOLN
40.0000 mg | Freq: Once | INTRAMUSCULAR | Status: DC
Start: 2015-11-26 — End: 2015-11-26

## 2015-11-26 MED ORDER — SODIUM CHLORIDE 0.9 % IV SOLN: 250.0000 mL | INTRAVENOUS | Status: DC | PRN

## 2015-11-26 MED ORDER — CALCIUM CARBONATE ANTACID 500 MG PO CHEW: 3.0000 | CHEWABLE_TABLET | Freq: Four times a day (QID) | ORAL | Status: DC | PRN

## 2015-11-26 MED ORDER — SPIRONOLACTONE 100 MG PO TABS
200.0000 mg | ORAL_TABLET | Freq: Every day | ORAL | Status: DC
  Administered 2015-11-27 – 2015-11-28 (×2): 200 mg via ORAL
  Filled 2015-11-26: qty 2
  Filled 2015-11-26: qty 8
  Filled 2015-11-26: qty 2

## 2015-11-26 MED ORDER — SODIUM CHLORIDE 0.9 % IV SOLN
INTRAVENOUS | Status: DC
  Filled 2015-11-26: qty 2.5

## 2015-11-26 MED ORDER — FUROSEMIDE 10 MG/ML IJ SOLN
60.0000 mg | Freq: Two times a day (BID) | INTRAMUSCULAR | Status: DC
  Administered 2015-11-27 – 2015-11-28 (×3): 60 mg via INTRAVENOUS
  Filled 2015-11-26 (×5): qty 6

## 2015-11-26 MED ORDER — HYDROCODONE-ACETAMINOPHEN 5-325 MG PO TABS
1.0000 | ORAL_TABLET | ORAL | Status: DC | PRN
  Administered 2015-11-26 – 2015-11-27 (×2): 2 via ORAL
  Administered 2015-11-27: 1 via ORAL
  Administered 2015-11-27: 2 via ORAL
  Administered 2015-11-27: 1 via ORAL
  Administered 2015-11-28 (×3): 2 via ORAL
  Filled 2015-11-26: qty 2
  Filled 2015-11-26: qty 1
  Filled 2015-11-26: qty 2
  Filled 2015-11-26: qty 1
  Filled 2015-11-26 (×4): qty 2

## 2015-11-26 MED ORDER — ACETAMINOPHEN 650 MG RE SUPP: 650.0000 mg | Freq: Four times a day (QID) | RECTAL | Status: DC | PRN

## 2015-11-26 MED ORDER — SERTRALINE HCL 50 MG PO TABS
150.0000 mg | ORAL_TABLET | Freq: Every day | ORAL | Status: DC
  Administered 2015-11-26 – 2015-11-28 (×3): 150 mg via ORAL
  Filled 2015-11-26 (×3): qty 3

## 2015-11-26 MED ORDER — HYDROMORPHONE HCL 1 MG/ML IJ SOLN
0.5000 mg | Freq: Once | INTRAMUSCULAR | Status: AC
Start: 2015-11-26 — End: 2015-11-26
  Administered 2015-11-26: 0.5 mg via INTRAVENOUS
  Filled 2015-11-26: qty 1

## 2015-11-26 MED ORDER — FUROSEMIDE 10 MG/ML IJ SOLN: 80.0000 mg | Freq: Once | INTRAMUSCULAR | Status: DC

## 2015-11-26 MED ORDER — LACTULOSE 10 GM/15ML PO SOLN
20.0000 g | Freq: Three times a day (TID) | ORAL | Status: DC
  Administered 2015-11-26 – 2015-11-28 (×5): 20 g via ORAL
  Filled 2015-11-26 (×5): qty 30

## 2015-11-26 MED ORDER — LORAZEPAM 2 MG/ML IJ SOLN
2.0000 mg | INTRAMUSCULAR | Status: DC | PRN
  Administered 2015-11-26: 2 mg via INTRAVENOUS
  Filled 2015-11-26: qty 1

## 2015-11-26 MED ORDER — VITAMIN B-1 100 MG PO TABS
100.0000 mg | ORAL_TABLET | Freq: Every day | ORAL | Status: DC
Start: 2015-11-26 — End: 2015-11-28
  Administered 2015-11-26 – 2015-11-28 (×3): 100 mg via ORAL
  Filled 2015-11-26 (×3): qty 1

## 2015-11-26 MED ORDER — DEXTROSE 5 % IV SOLN
2.0000 g | INTRAVENOUS | Status: DC
  Administered 2015-11-26 – 2015-11-27 (×2): 2 g via INTRAVENOUS
  Filled 2015-11-26 (×3): qty 2

## 2015-11-26 MED ORDER — ACETAMINOPHEN 325 MG PO TABS: 650.0000 mg | ORAL_TABLET | Freq: Four times a day (QID) | ORAL | Status: DC | PRN

## 2015-11-26 MED ORDER — DEXTROSE-NACL 5-0.45 % IV SOLN
INTRAVENOUS | Status: DC
  Administered 2015-11-27: 03:00:00 via INTRAVENOUS

## 2015-11-26 MED ORDER — FOLIC ACID 1 MG PO TABS
1.0000 mg | ORAL_TABLET | Freq: Every day | ORAL | Status: DC
  Administered 2015-11-26 – 2015-11-28 (×3): 1 mg via ORAL
  Filled 2015-11-26 (×3): qty 1

## 2015-11-26 MED ORDER — POTASSIUM CHLORIDE 10 MEQ/100ML IV SOLN
10.0000 meq | INTRAVENOUS | Status: AC
Start: 2015-11-26 — End: 2015-11-27
  Administered 2015-11-26 (×2): 10 meq via INTRAVENOUS
  Filled 2015-11-26 (×2): qty 100

## 2015-11-26 MED ORDER — ALBUMIN HUMAN 25 % IV SOLN
75.0000 g | Freq: Once | INTRAVENOUS | Status: AC
  Administered 2015-11-26: 75 g via INTRAVENOUS
  Filled 2015-11-26: qty 300

## 2015-11-26 MED ORDER — FUROSEMIDE 10 MG/ML IJ SOLN
80.0000 mg | Freq: Once | INTRAMUSCULAR | Status: AC
  Administered 2015-11-26: 80 mg via INTRAVENOUS
  Filled 2015-11-26: qty 8

## 2015-11-26 MED ORDER — ATENOLOL 25 MG PO TABS
12.5000 mg | ORAL_TABLET | Freq: Every day | ORAL | Status: DC
  Administered 2015-11-26 – 2015-11-28 (×2): 12.5 mg via ORAL
  Filled 2015-11-26 (×3): qty 1

## 2015-11-26 MED ORDER — SODIUM CHLORIDE 0.9 % IV SOLN
INTRAVENOUS | Status: DC
  Administered 2015-11-26: 5.4 [IU]/h via INTRAVENOUS
  Filled 2015-11-26: qty 2.5

## 2015-11-26 MED ORDER — SODIUM CHLORIDE 0.9% FLUSH: 3.0000 mL | INTRAVENOUS | Status: DC | PRN

## 2015-11-26 MED ORDER — SODIUM CHLORIDE 0.9% FLUSH
3.0000 mL | Freq: Two times a day (BID) | INTRAVENOUS | Status: DC
  Administered 2015-11-27 – 2015-11-28 (×3): 3 mL via INTRAVENOUS

## 2015-11-26 MED ORDER — FUROSEMIDE 40 MG PO TABS: 80.0000 mg | ORAL_TABLET | Freq: Every day | ORAL | Status: DC

## 2015-11-26 MED ORDER — SODIUM CHLORIDE 0.9% FLUSH
3.0000 mL | Freq: Two times a day (BID) | INTRAVENOUS | Status: DC
  Administered 2015-11-26 – 2015-11-28 (×4): 3 mL via INTRAVENOUS

## 2015-11-26 MED ORDER — ADULT MULTIVITAMIN W/MINERALS CH
1.0000 | ORAL_TABLET | Freq: Every day | ORAL | Status: DC
  Administered 2015-11-26 – 2015-11-28 (×3): 1 via ORAL
  Filled 2015-11-26 (×3): qty 1

## 2015-11-26 NOTE — ED Triage Notes (Addendum)
BIB [facility] from [facility] w/ c/o fluid retention in her abd and lower bilateral leg swelling. Pt c/o sob and centralized cp w/o radiation. Pt has hx of cirrhosis of the liver. Pt A+OX4, speaking in complete sentences.

## 2015-11-26 NOTE — H&P (Signed)
History and Physical    [name] STM:[id number] DOB: 11/30/1965 DOA: 11/26/2015  PCP: Cathlean Cower, MD   Patient coming from: Home   Chief Complaint: Abdominal distension and pain, feet swollen  HPI: [name] is a 50 y.o. female with medical history significant for alcoholic liver cirrhosis, chronic hepatitis C, depression, insulin-dependent diabetes mellitus, and pancytopenia who presents the emergency department with abdominal pain with swelling of the abdomen and bilateral feet. Patient was discharged from this institution approximately one month ago after being admitted under similar circumstances and undergoing 2 large volume paracenteses. She reports doing well back at home initially, but states that over the past several days there is been rapid reaccumulation of the abdominal ascites and the development of bilateral pedal edema. She reports exquisite tenderness of the abdomen and associated nausea. She denies vomiting, melena, or hematochezia. Patient reports feeling hot all the time, but denies any fevers per se. She endorses some dyspnea which she attributes to the abdominal distention, but denies cough, chest pain, or palpitations. She endorses severe pain at the bilateral feet which has developed in conjunction with the swelling. She reports continued adherence to all of her medications.  ED Course: Upon arrival to the ED, patient is found to be afebrile, saturating 89% on room air, tachycardic in the 120s, and with vitals otherwise stable. CBG reads ">600." EKG demonstrates a sinus tachycardia with rate 126 and low-voltage QRS. Chest x-ray is negative for acute cardiopulmonary disease. CMP is notable for mild hyponatremia and hypochloremia, mild elevation in total bilirubin, and serum glucose of 749. CBC features a stable pancytopenia with WBC 3700, hemoglobin 10.8, and platelet count 105,000. Troponin is undetectable and ammonia is mildly elevated at 51. Urinalysis features greater than  1000 glucose, no ketones, and elevated specific gravity. Patient was treated with Dilaudid in the emergency department and started on IV insulin infusion.  Review of Systems:  All other systems reviewed and apart from HPI, are negative.  Past Medical History:  Diagnosis Date  . Abnormal vaginal bleeding    uterine fibroid  . Alcohol dependency ([place])    at least [place] admissions 2007- 09/2013 for detox.   Marland Kitchen Anxiety   . Chronic abdominal pain 2011  . Chronic constipation   . Cirrhosis ([place]) 11/09/2010  . Drug dependency ([place])    Hx of cocaine use  . Gallstone 2006  . GERD (gastroesophageal reflux disease)   . Hemochromatosis    last phlebotomy tx ~ 2012. liver bx 2006  . Hepatitis C approx dx 2005   no tx to date  . Major depression ([place]) 11/2012, 09/2013   [facility] admissions for this, suicide attempt by OD Ambien (2014), Prozac (2015),ETOHism   . Pancreatitis    HX of  . Pancytopenia   . Rheumatoid factor positive   . Type II or unspecified type diabetes mellitus without mention of complication, uncontrolled 09/17/2011    Past Surgical History:  Procedure Laterality Date  . ABDOMINAL HYSTERECTOMY  12/17/2011   Procedure: HYSTERECTOMY ABDOMINAL;  Surgeon: Gus Height, MD;  Location: [place];  Service: Gynecology;  Laterality: N/A;  . APPENDECTOMY    . CESAREAN SECTION  x 2  . COLONOSCOPY N/A 02/18/2014   Procedure: COLONOSCOPY;  Surgeon: Gatha Mayer, MD;  Location: [place];  Service: Endoscopy;  Laterality: N/A;  . ESOPHAGOGASTRODUODENOSCOPY N/A 12/10/2013   Procedure: ESOPHAGOGASTRODUODENOSCOPY (EGD);  Surgeon: Ladene Artist, MD;  Location: [facility];  Service: Endoscopy;  Laterality: N/A;  . ESOPHAGOGASTRODUODENOSCOPY (EGD)  WITH PROPOFOL N/A 08/08/2015   Procedure: ESOPHAGOGASTRODUODENOSCOPY (EGD) WITH PROPOFOL;  Surgeon: Gatha Mayer, MD;  Location: [facility];  Service: Endoscopy;  Laterality: N/A;  . FOOT SURGERY  2013  . PERCUTANEOUS LIVER BIOPSY  2011  .  SALPINGOOPHORECTOMY  12/17/2011   Procedure: SALPINGO OOPHERECTOMY;  Surgeon: Gus Height, MD;  Location: [facility];  Service: Gynecology;  Laterality: Bilateral;     reports that she has never smoked. She has never used smokeless tobacco. She reports that she drinks alcohol. She reports that she does not use drugs.  Allergies  Allergen Reactions  . Other Itching and Other (See Comments)    Pt states that she is allergic to CHG wipes.    . Betadine [Povidone Iodine] Hives    Family History  Problem Relation Age of Onset  . Cancer Mother     ovarian  . Diabetes Father      Prior to Admission medications   Medication Sig Start Date End Date Taking? Authorizing Provider  atenolol (TENORMIN) 25 MG tablet Take 0.5 tablets (12.5 mg total) by mouth daily. 08/10/15  Yes Barton Dubois, MD  calcium carbonate (TUMS EX) 750 MG chewable tablet Chew 3 tablets by mouth 4 (four) times daily as needed for heartburn.   Yes Historical Provider, MD  folic acid (FOLVITE) 1 MG tablet Take 1 tablet (1 mg total) by mouth daily. 05/27/15  Yes Lavina Hamman, MD  furosemide (LASIX) 40 MG tablet Take 2 tablets (80 mg total) by mouth daily. 08/24/15  Yes Gatha Mayer, MD  insulin aspart (NOVOLOG FLEXPEN) 100 UNIT/ML FlexPen Inject 9 Units into the skin 3 (three) times daily with meals.   Yes Historical Provider, MD  insulin glargine (LANTUS) 100 unit/mL SOPN Inject 40 Units into the skin at bedtime.   Yes Historical Provider, MD  lactulose (CHRONULAC) 10 GM/15ML solution Take 20 g by mouth 3 (three) times daily as needed for mild constipation.   Yes Historical Provider, MD  metFORMIN (GLUCOPHAGE) 500 MG tablet Take 500 mg by mouth 2 (two) times daily with a meal.   Yes Historical Provider, MD  Multiple Vitamin (MULTIVITAMIN WITH MINERALS) TABS tablet Take 1 tablet by mouth daily.    Yes Historical Provider, MD  pantoprazole (PROTONIX) 40 MG tablet Take 1 tablet (40 mg total) by mouth 2 (two) times daily. 08/10/15  Yes  Barton Dubois, MD  potassium chloride (K-DUR,KLOR-CON) 10 MEQ tablet Take 10 mEq by mouth daily.   Yes Historical Provider, MD  sertraline (ZOLOFT) 100 MG tablet Take 150 mg by mouth daily.   Yes Historical Provider, MD  spironolactone (ALDACTONE) 100 MG tablet Take 2 tablets (200 mg total) by mouth daily. 11/22/15  Yes Gatha Mayer, MD  thiamine 100 MG tablet Take 1 tablet (100 mg total) by mouth daily. 05/27/15  Yes Lavina Hamman, MD  diphenhydrAMINE (BENADRYL) 25 MG tablet Take 4 tablets (100 mg total) by mouth at bedtime as needed for sleep. Patient not taking: Reported on 10/21/2015 08/10/15   Barton Dubois, MD  nystatin cream (MYCOSTATIN) Apply topically 3 (three) times daily. Apply to groin area and inner thighs Patient not taking: Reported on 10/21/2015 08/10/15   Barton Dubois, MD  oxyCODONE (OXY IR/ROXICODONE) 5 MG immediate release tablet Take 1 tablet (5 mg total) by mouth every 6 (six) hours as needed for severe pain. Patient not taking: Reported on 10/21/2015 08/24/15   Gatha Mayer, MD    Physical Exam: Vitals:   11/26/15 1815  11/26/15 1922 11/26/15 1930 11/26/15 2000  BP: 123/94 114/83 107/76 120/86  Pulse: (!) 122 (!) 126 (!) 132   Resp: '18 19 12 17  ' Temp: 98.6 F (37 C)     TempSrc: Oral     SpO2: (!) 89% 94% 95%       Constitutional: NAD, calm, in apparent discomfort Eyes: PERTLA, lids and conjunctivae normal ENMT: Mucous membranes are dry. Posterior pharynx clear of any exudate or lesions.   Neck: normal, supple, no masses, no thyromegaly Respiratory: clear to auscultation bilaterally, no wheezing, no crackles. Normal respiratory effort. Cardiovascular: Rate ~120 and regular. B/l pedal edema. 2+ pedal pulses. No carotid bruits.  Abdomen: Mild distension, soft, exquisite tenderness throughout, no guarding or rebound pain. Bowel sounds normal.  Musculoskeletal: no clubbing / cyanosis. Rt ankle swelling > Lt and surgical scar noted at medial right ankle.  Skin: no  significant rashes, lesions, ulcers. Warm, dry, well-perfused. Neurologic: CN 2-12 grossly intact. Sensation intact, DTR normal. Strength 5/5 in all 4 limbs.  Psychiatric: Normal judgment and insight. Alert and oriented x 3. Normal mood and affect.     Labs on Admission: I have personally reviewed following labs and imaging studies  CBC:  Recent Labs Lab 11/26/15 1820  WBC 3.7*  HGB 10.8*  HCT 34.2*  MCV 98.0  PLT 643*   Basic Metabolic Panel:  Recent Labs Lab 11/26/15 1820  NA 132*  K 3.9  CL 93*  CO2 23  GLUCOSE 749*  BUN 12  CREATININE 0.54  CALCIUM 9.4   GFR: CrCl cannot be calculated (Unknown ideal weight.). Liver Function Tests:  Recent Labs Lab 11/26/15 1820  AST 53*  ALT 56*  ALKPHOS 177*  BILITOT 1.7*  PROT 8.1  ALBUMIN 3.1*    Recent Labs Lab 11/26/15 1820  LIPASE 69*    Recent Labs Lab 11/26/15 1905  AMMONIA 51*   Coagulation Profile: No results for input(s): INR, PROTIME in the last 168 hours. Cardiac Enzymes: No results for input(s): CKTOTAL, CKMB, CKMBINDEX, TROPONINI in the last 168 hours. BNP (last 3 results) No results for input(s): PROBNP in the last 8760 hours. HbA1C: No results for input(s): HGBA1C in the last 72 hours. CBG:  Recent Labs Lab 11/26/15 2018  GLUCAP >600*   Lipid Profile: No results for input(s): CHOL, HDL, LDLCALC, TRIG, CHOLHDL, LDLDIRECT in the last 72 hours. Thyroid Function Tests: No results for input(s): TSH, T4TOTAL, FREET4, T3FREE, THYROIDAB in the last 72 hours. Anemia Panel: No results for input(s): VITAMINB12, FOLATE, FERRITIN, TIBC, IRON, RETICCTPCT in the last 72 hours. Urine analysis:    Component Value Date/Time   COLORURINE YELLOW 11/26/2015 1811   APPEARANCEUR CLEAR 11/26/2015 1811   LABSPEC 1.033 (H) 11/26/2015 1811   PHURINE 6.0 11/26/2015 1811   GLUCOSEU >1000 (A) 11/26/2015 1811   GLUCOSEU >=1000 (A) 07/18/2014 0943   HGBUR NEGATIVE 11/26/2015 1811   BILIRUBINUR NEGATIVE  11/26/2015 1811   BILIRUBINUR neg 05/19/2013 0855   KETONESUR NEGATIVE 11/26/2015 1811   PROTEINUR NEGATIVE 11/26/2015 1811   UROBILINOGEN 1.0 03/02/2015 1041   NITRITE NEGATIVE 11/26/2015 1811   LEUKOCYTESUR NEGATIVE 11/26/2015 1811   Sepsis Labs: '@LABRCNTIP' (procalcitonin:4,lacticidven:4) )No results found for this or any previous visit (from the past 240 hour(s)).   Radiological Exams on Admission: Dg Chest 2 View  Result Date: 11/26/2015 CLINICAL DATA:  Shortness of breath today. EXAM: CHEST  2 VIEW COMPARISON:  PA and lateral chest 05/23/2015 and 03/29/2015. FINDINGS: The mild linear atelectasis is seen in the  left lung base. The lungs are otherwise clear. Heart size is normal. No pneumothorax or pleural effusion. No focal bony abnormality. IMPRESSION: No acute disease. Electronically Signed   By: Inge Rise M.D.   On: 11/26/2015 18:46    EKG: Independently reviewed. Sinus tachycardia (raet 126), low-voltage QRS  Assessment/Plan  1. Hyperosmolar hyperglycemic state, IDDM - Presents with serum glucose of 749 and elevated AG, but normal bicarb and no ketones  - She reports strict adherence to her home insulin regimen of Lantus 40 qHS and Novolog 9 TID with meals - Concerned that this may have been precipitated by SBP given her report of abd px and rapid re-accumulation in ascites; eval and mgmt as below - Alternatively, she may not be completely forthcoming about her insulin use given A1c of 12.8% one month ago - Started on insulin infusion in ED; no IVF was given d/t marked fluid o/l; will give  - Continue insulin infusion according to protocol until parameters met for transition to sq insulins  - Serial chem panels while on insulin infusion   2. Acute abdominal pain and distension  - Pt was admitted in early July 2017 and underwent large-vol paracentesis x2 with no evidence for SBP on fluid studies  - She now reports rapid re-accumulation in ascites and concomitant  development of severe generalized abd pain - There is some concern for SBP given her presentation with associated HHS; no fever; chronic leukopenia  - Will start empiric treatment with Rocephin 1g q24h; will give 1.5 mg/kg albumin; plan to follow albumin with Lasix given marked vol o/l with 15 lb wt gain in last month  - US paracentesis requested with fluid studies; if SBP confirmed with fluid studies, may need second dose albumin on day 3 of tx  - Pain-control with prn's; given her hx of chronic pain and reported narcotic-seeking behavior, trying to avoid IV narcotic as she is currently tolerating of PO; ketorolac not appropriate given her GI issues and pancytopenia   3. Alcoholic cirrhosis with refractory ascites  - MELD 17 using INR from last admit, updated INR pending  - EGD in April 2017 with grade 1 varices and erosive gastropathy  - No hx of SBP or variceal bleeding  - Managed with Lasix, Aldactone, atenolol, lactulose, vitamins & minerals at home  - She had large-vol paracentesis x2 in early July 2017 - Lasix converted to IV given marked vol o/l with 15 lb wt gain in last month; continuing the rest of the aforementioned medications  - US paracentesis requested for therapeutic and diagnostic purposes  - Treating empirically for SBP as above   4. Pancytopenia  - Appears stable relative to prior CBC's  - No sign of active blood-loss  5. Hyponatremia  - Serum sodium 132 on admission  - Likely secondary to cirrhosis  - Will be obtaining serial chem panels as above during tx of HHS    6. Depression, anxiety   - Appears to be stable  - Continue Zoloft    7. Alcohol abuse  - Monitor with CIWA and prn Ativan  - Abstinence encouraged     DVT prophylaxis: SCD's  Code Status: Full  Family Communication: Discussed with patient Disposition Plan: Admit to stepdown  Consults called: None Admission status: Inpatient    Vianne Bulls, MD [facility] Pager [phone]  If  7PM-7AM, please contact night-coverage [email or web address] Password [email or web address]  11/26/2015, 9:30 PM

## 2015-11-26 NOTE — ED Notes (Signed)
Patient transported to X-ray 

## 2015-11-26 NOTE — ED Notes (Signed)
Bed: WA20 Expected date:  Expected time:  Means of arrival:  Comments: 50 yo cirrhosis. edema

## 2015-11-26 NOTE — ED Provider Notes (Signed)
[facility] DEPT Provider Note   CSN: [id number] Arrival date & time: 11/26/15  1803  First Provider Contact:  None       History   Chief Complaint Chief Complaint  Patient presents with  . Abdominal Pain  . Ascites  . Leg Swelling    HPI [name] is a 50 y.o. female who presents with abdominal pain, distension, bilateral leg swelling. PMH significant for alcoholic liver cirrhosis, recurrent ascites, chronic abdominal pain, Hep C, pancytopenia, insulin dependent DM, hx of cocaine use, anxiety/depression. She was recently admitted in July for the same. Over the past 5 days she reports increased abdominal distension and bilateral foot and leg swelling. Reports associated intermittent confusion, and SOB which she attributes to her abdominal distension. She states she has been taking all her meds as directed. Denies fever, chills, chest pain, cough, N/V/D, dysuria. She states she has taking ASA for pain which has not helped. She also reports still consuming alcohol despite her liver disease.  HPI  Past Medical History:  Diagnosis Date  . Abnormal vaginal bleeding    uterine fibroid  . Alcohol dependency ([place])    at least [facility] admissions 2007- 09/2013 for detox.   Marland Kitchen Anxiety   . Chronic abdominal pain 2011  . Chronic constipation   . Cirrhosis ([place]) 11/09/2010  . Drug dependency ([place])    Hx of cocaine use  . Gallstone 2006  . GERD (gastroesophageal reflux disease)   . Hemochromatosis    last phlebotomy tx ~ 2012. liver bx 2006  . Hepatitis C approx dx 2005   no tx to date  . Major depression ([place]) 11/2012, 09/2013   [facility] admissions for this, suicide attempt by OD Ambien (2014), Prozac (2015),ETOHism   . Pancreatitis    HX of  . Pancytopenia   . Rheumatoid factor positive   . Type II or unspecified type diabetes mellitus without mention of complication, uncontrolled 09/17/2011    Patient Active Problem List   Diagnosis Date Noted  . SBP (spontaneous bacterial  peritonitis) ([place]) 10/22/2015  . Sepsis ([place]) 10/22/2015  . Pancytopenia ([place]) 10/22/2015  . Diabetes mellitus type II, uncontrolled ([place]) 10/22/2015  . Chronic hepatitis C without hepatic coma ([place]) 08/24/2015  . Absolute anemia   . Hyponatremia   . Erosive gastritis   . Secondary esophageal varices without bleeding ([place])   . Intractable abdominal pain 08/07/2015  . Anxiety and depression 08/07/2015  . GERD (gastroesophageal reflux disease) 08/07/2015  . Anemia 08/07/2015  . Leukopenia 05/23/2015  . Acute generalized abdominal pain   . Ascites due to alcoholic cirrhosis ([place])   . Alcoholic cirrhosis of liver with ascites ([place]) 03/29/2015  . Poorly controlled type 2 diabetes mellitus ([place]) 03/29/2015  . Ascites 01/03/2015  . Protein-calorie malnutrition, severe ([place]) 01/03/2015  . Abdominal pain 01/03/2015  . Peritonitis with effusion ([place]) 11/09/2014  . Gallstones   . Elevated LFTs 07/20/2014  . Severe recurrent major depression without psychotic features ([place]) 07/19/2014  . Wellness examination 07/14/2014  . Colon polyps 02/18/2014  . Rectal varices - small 02/18/2014  . Thrombocytopenia ([place]) 02/14/2014  . Acute alcohol intoxication ([place]) 12/09/2013  . MDD (major depressive disorder) ([place]) 09/23/2013  . Alcohol dependence ([place]) 12/17/2012  . Cocaine abuse 12/17/2012    Past Surgical History:  Procedure Laterality Date  . ABDOMINAL HYSTERECTOMY  12/17/2011   Procedure: HYSTERECTOMY ABDOMINAL;  Surgeon: Gus Height, MD;  Location: [facility];  Service: Gynecology;  Laterality: N/A;  . APPENDECTOMY    .  CESAREAN SECTION  x 2  . COLONOSCOPY N/A 02/18/2014   Procedure: COLONOSCOPY;  Surgeon: Gatha Mayer, MD;  Location: [facility];  Service: Endoscopy;  Laterality: N/A;  . ESOPHAGOGASTRODUODENOSCOPY N/A 12/10/2013   Procedure: ESOPHAGOGASTRODUODENOSCOPY (EGD);  Surgeon: Ladene Artist, MD;  Location: [facility];  Service: Endoscopy;  Laterality: N/A;  .  ESOPHAGOGASTRODUODENOSCOPY (EGD) WITH PROPOFOL N/A 08/08/2015   Procedure: ESOPHAGOGASTRODUODENOSCOPY (EGD) WITH PROPOFOL;  Surgeon: Gatha Mayer, MD;  Location: [facility];  Service: Endoscopy;  Laterality: N/A;  . FOOT SURGERY  2013  . PERCUTANEOUS LIVER BIOPSY  2011  . SALPINGOOPHORECTOMY  12/17/2011   Procedure: SALPINGO OOPHERECTOMY;  Surgeon: Gus Height, MD;  Location: [facility];  Service: Gynecology;  Laterality: Bilateral;    OB History    Gravida Para Term Preterm AB Living   2 2 2     3    SAB TAB Ectopic Multiple Live Births                   Home Medications    Prior to Admission medications   Medication Sig Start Date End Date Taking? Authorizing Provider  atenolol (TENORMIN) 25 MG tablet Take 0.5 tablets (12.5 mg total) by mouth daily. 08/10/15   Barton Dubois, MD  calcium carbonate (TUMS EX) 750 MG chewable tablet Chew 3 tablets by mouth 4 (four) times daily as needed for heartburn.    Historical Provider, MD  diphenhydrAMINE (BENADRYL) 25 MG tablet Take 4 tablets (100 mg total) by mouth at bedtime as needed for sleep. Patient not taking: Reported on 10/21/2015 08/10/15   Barton Dubois, MD  folic acid (FOLVITE) 1 MG tablet Take 1 tablet (1 mg total) by mouth daily. 05/27/15   Lavina Hamman, MD  furosemide (LASIX) 40 MG tablet Take 2 tablets (80 mg total) by mouth daily. 08/24/15   Gatha Mayer, MD  furosemide (LASIX) 40 MG tablet Take 1 tablet (40 mg total) by mouth 2 (two) times daily. Overdue for yearly physical w/labs must see MD for refills 11/13/15   Biagio Borg, MD  insulin aspart (NOVOLOG FLEXPEN) 100 UNIT/ML FlexPen Inject 9 Units into the skin 3 (three) times daily with meals. 10/27/15   Shanker Kristeen Mans, MD  insulin glargine (LANTUS) 100 UNIT/ML injection Inject 0.32 mLs (32 Units total) into the skin 2 (two) times daily. INJECT 0.4 MLS (40 UNITS TOTAL) INTO THE SKIN AT BEDTIME. 10/27/15   Shanker Kristeen Mans, MD  Insulin Pen Needle (BD PEN NEEDLE NANO U/F) 32G X 4 MM MISC  Use as needed to inject insulin as prescribed 08/10/15   Barton Dubois, MD  lactulose (CHRONULAC) 10 GM/15ML solution Take 30 mLs (20 g total) by mouth 3 (three) times daily. Hold for the day after you have 3 soft stool/day. 05/27/15   Lavina Hamman, MD  metFORMIN (GLUCOPHAGE) 500 MG tablet TAKE 1 TABLET (500 MG TOTAL) BY MOUTH 2 (TWO) TIMES DAILY WITH A MEAL. 05/22/15   Biagio Borg, MD  Multiple Vitamin (MULTIVITAMIN WITH MINERALS) TABS tablet Take 1 tablet by mouth daily.     Historical Provider, MD  nystatin cream (MYCOSTATIN) Apply topically 3 (three) times daily. Apply to groin area and inner thighs Patient not taking: Reported on 10/21/2015 08/10/15   Barton Dubois, MD  oxyCODONE (OXY IR/ROXICODONE) 5 MG immediate release tablet Take 1 tablet (5 mg total) by mouth every 6 (six) hours as needed for severe pain. Patient not taking: Reported on 10/21/2015 08/24/15   Glendell Docker  Simonne Maffucci, MD  pantoprazole (PROTONIX) 40 MG tablet Take 1 tablet (40 mg total) by mouth 2 (two) times daily. 08/10/15   Barton Dubois, MD  potassium chloride (KLOR-CON M10) 10 MEQ tablet Take 1 tablet (10 mEq total) by mouth daily. Overdue for yearly physical w/labs must see Md for refills 11/13/15   Biagio Borg, MD  rifaximin (XIFAXAN) 550 MG TABS tablet Take 1 tablet (550 mg total) by mouth 2 (two) times daily. Patient not taking: Reported on 10/21/2015 05/27/15   Lavina Hamman, MD  sertraline (ZOLOFT) 100 MG tablet Take 150 mg by mouth daily.    Historical Provider, MD  spironolactone (ALDACTONE) 100 MG tablet Take 2 tablets (200 mg total) by mouth daily. 11/22/15   Gatha Mayer, MD  thiamine 100 MG tablet Take 1 tablet (100 mg total) by mouth daily. 05/27/15   Lavina Hamman, MD    Family History Family History  Problem Relation Age of Onset  . Cancer Mother     ovarian  . Diabetes Father     Social History Social History  Substance Use Topics  . Smoking status: Never Smoker  . Smokeless tobacco: Never Used  . Alcohol use Yes       Comment: one pint vodka daily years     Allergies   Other and Betadine [povidone iodine]   Review of Systems Review of Systems  Constitutional: Negative for chills and fever.  Respiratory: Positive for shortness of breath. Negative for cough.   Cardiovascular: Positive for leg swelling. Negative for chest pain.  Gastrointestinal: Positive for abdominal distention and abdominal pain. Negative for constipation, diarrhea, nausea and vomiting.  Genitourinary: Negative for dysuria.  Psychiatric/Behavioral: Positive for confusion.       +memory problems  All other systems reviewed and are negative.    Physical Exam Updated Vital Signs BP 123/94 (BP Location: Right Arm)   Pulse (!) 122   Temp 98.6 F (37 C) (Oral)   Resp 18   LMP 11/01/2011   SpO2 (!) 89%   Physical Exam  Constitutional: She is oriented to person, place, and time. No distress.  Chronically ill appearing   HENT:  Head: Normocephalic and atraumatic.  Eyes: Conjunctivae are normal. Pupils are equal, round, and reactive to light. Right eye exhibits no discharge. Left eye exhibits no discharge. No scleral icterus.  Neck: Normal range of motion. Neck supple.  Cardiovascular: Regular rhythm.  Tachycardia present.  Exam reveals no gallop and no friction rub.   No murmur heard. Pulmonary/Chest: Effort normal and breath sounds normal. No respiratory distress. She has no wheezes. She has no rales. She exhibits no tenderness.  Abdominal: Bowel sounds are normal. She exhibits distension, fluid wave and ascites. She exhibits no mass. There is hepatomegaly. There is generalized tenderness. There is no rebound, no guarding and no CVA tenderness. No hernia.  Musculoskeletal: She exhibits no edema.  Neurological: She is alert and oriented to person, place, and time.  Skin: Skin is warm and dry.  Psychiatric: She has a normal mood and affect. Her behavior is normal.  Nursing note and vitals reviewed.    ED Treatments /  Results  Labs (all labs ordered are listed, but only abnormal results are displayed) Labs Reviewed  LIPASE, BLOOD - Abnormal; Notable for the following:       Result Value   Lipase 69 (*)    All other components within normal limits  COMPREHENSIVE METABOLIC PANEL - Abnormal; Notable for the following:  Sodium 132 (*)    Chloride 93 (*)    Glucose, Bld 749 (*)    Albumin 3.1 (*)    AST 53 (*)    ALT 56 (*)    Alkaline Phosphatase 177 (*)    Total Bilirubin 1.7 (*)    Anion gap 16 (*)    All other components within normal limits  CBC - Abnormal; Notable for the following:    WBC 3.7 (*)    RBC 3.49 (*)    Hemoglobin 10.8 (*)    HCT 34.2 (*)    RDW 16.4 (*)    Platelets 105 (*)    All other components within normal limits  URINALYSIS, ROUTINE W REFLEX MICROSCOPIC (NOT AT [facility]) - Abnormal; Notable for the following:    Specific Gravity, Urine 1.033 (*)    Glucose, UA >1000 (*)    All other components within normal limits  AMMONIA - Abnormal; Notable for the following:    Ammonia 51 (*)    All other components within normal limits  URINE MICROSCOPIC-ADD ON - Abnormal; Notable for the following:    Squamous Epithelial / LPF 0-5 (*)    Bacteria, UA RARE (*)    All other components within normal limits  CBG MONITORING, ED - Abnormal; Notable for the following:    Glucose-Capillary >600 (*)    All other components within normal limits  I-STAT TROPOININ, ED    EKG  EKG Interpretation None       Radiology Dg Chest 2 View  Result Date: 11/26/2015 CLINICAL DATA:  Shortness of breath today. EXAM: CHEST  2 VIEW COMPARISON:  PA and lateral chest 05/23/2015 and 03/29/2015. FINDINGS: The mild linear atelectasis is seen in the left lung base. The lungs are otherwise clear. Heart size is normal. No pneumothorax or pleural effusion. No focal bony abnormality. IMPRESSION: No acute disease. Electronically Signed   By: Inge Rise M.D.   On: 11/26/2015 18:46     Procedures Procedures (including critical care time)  Medications Ordered in ED Medications  insulin regular (NOVOLIN R,HUMULIN R) 250 Units in sodium chloride 0.9 % 250 mL (1 Units/mL) infusion ( Intravenous Transfusing/Transfer 11/26/15 2113)  HYDROmorphone (DILAUDID) injection 0.5 mg (0.5 mg Intravenous Given 11/26/15 1919)     Initial Impression / Assessment and Plan / ED Course  I have reviewed the triage vital signs and the nursing notes.  Pertinent labs & imaging results that were available during my care of the patient were reviewed by me and considered in my medical decision making (see chart for details).  CRITICAL CARE Performed by: Recardo Evangelist   Total critical care time: 30 minutes  Critical care time was exclusive of separately billable procedures and treating other patients.  Critical care was necessary to treat or prevent imminent or life-threatening deterioration.  Critical care was time spent personally by me on the following activities: development of treatment plan with patient and/or surrogate as well as nursing, discussions with consultants, evaluation of patient's response to treatment, examination of patient, obtaining history from patient or surrogate, ordering and performing treatments and interventions, ordering and review of laboratory studies, ordering and review of radiographic studies, pulse oximetry and re-evaluation of patient's condition.   Clinical Course   50 year old female with multiple medical comorbidies presents with worsening ascites and hyperglycemia. She is tachycardic on arrival and had an episode of hypoxia. Repeat vitals sats are >94%. Patient is afebrile, not tachypneic, normotensive. EKG demonstrates a sinus tachycardia with  rate 126 and low-voltage QRS. Troponin is 0. CXR negative. CBC remarkable for pancytopenia which patient has known history of. CMP remarkable for mild hyponatremia and a blood glucose of 749. Insulin drip  started however fluids held due to patient's volume status. Ammonia is 51 which is stable. UA remarkable for >1000 glucose and specific gravity of 1.033. Dilaudid given for pain. Due to patient multiple comorbidities, being volume overloaded with marked hyperglycemia, will have her admitted for further management. Spoke with Dr. Myna Hidalgo who will admit.    Final Clinical Impressions(s) / ED Diagnoses   Final diagnoses:  Ascites  Abdominal pain    New Prescriptions New Prescriptions   No medications on file     Recardo Evangelist, PA-C 11/26/15 2342    Leo Grosser, MD 11/29/15 [phone]

## 2015-11-27 ENCOUNTER — Inpatient Hospital Stay (HOSPITAL_COMMUNITY): Payer: Medicaid Other

## 2015-11-27 DIAGNOSIS — B182 Chronic viral hepatitis C: Secondary | ICD-10-CM

## 2015-11-27 DIAGNOSIS — K652 Spontaneous bacterial peritonitis: Secondary | ICD-10-CM

## 2015-11-27 DIAGNOSIS — R1084 Generalized abdominal pain: Secondary | ICD-10-CM

## 2015-11-27 DIAGNOSIS — E1101 Type 2 diabetes mellitus with hyperosmolarity with coma: Secondary | ICD-10-CM

## 2015-11-27 LAB — BASIC METABOLIC PANEL
Anion gap: 11 (ref 5–15)
Anion gap: 9 (ref 5–15)
BUN: 9 mg/dL (ref 6–20)
BUN: 9 mg/dL (ref 6–20)
CALCIUM: 9.9 mg/dL (ref 8.9–10.3)
CHLORIDE: 95 mmol/L — AB (ref 101–111)
CHLORIDE: 96 mmol/L — AB (ref 101–111)
CO2: 31 mmol/L (ref 22–32)
CO2: 33 mmol/L — AB (ref 22–32)
CREATININE: 0.35 mg/dL — AB (ref 0.44–1.00)
CREATININE: 0.56 mg/dL (ref 0.44–1.00)
Calcium: 9.5 mg/dL (ref 8.9–10.3)
GFR calc non Af Amer: >60 mL/min (ref >60)
GFR calc non Af Amer: >60 mL/min (ref >60)
GLUCOSE: 371 mg/dL — AB (ref 65–99)
GLUCOSE: 88 mg/dL (ref 65–99)
Potassium: 3.3 mmol/L — ABNORMAL LOW (ref 3.5–5.1)
Potassium: 3.7 mmol/L (ref 3.5–5.1)
Sodium: 137 mmol/L (ref 135–145)
Sodium: 138 mmol/L (ref 135–145)

## 2015-11-27 LAB — BODY FLUID CELL COUNT WITH DIFFERENTIAL
Eos, Fluid: 0 %
Lymphs, Fluid: 13 %
MONOCYTE-MACROPHAGE-SEROUS FLUID: 86 % (ref 50–90)
Neutrophil Count, Fluid: 1 % (ref 0–25)
WBC FLUID: 245 uL (ref 0–1000)

## 2015-11-27 LAB — COMPREHENSIVE METABOLIC PANEL
ALT: 44 U/L (ref 14–54)
AST: 50 U/L — AB (ref 15–41)
Albumin: 3.9 g/dL (ref 3.5–5.0)
Alkaline Phosphatase: 133 U/L — ABNORMAL HIGH (ref 38–126)
Anion gap: 11 (ref 5–15)
BILIRUBIN TOTAL: 2.2 mg/dL — AB (ref 0.3–1.2)
BUN: 9 mg/dL (ref 6–20)
CALCIUM: 9.3 mg/dL (ref 8.9–10.3)
CHLORIDE: 94 mmol/L — AB (ref 101–111)
CO2: 32 mmol/L (ref 22–32)
CREATININE: 0.4 mg/dL — AB (ref 0.44–1.00)
Glucose, Bld: 131 mg/dL — ABNORMAL HIGH (ref 65–99)
Potassium: 3.8 mmol/L (ref 3.5–5.1)
Sodium: 137 mmol/L (ref 135–145)
TOTAL PROTEIN: 8 g/dL (ref 6.5–8.1)

## 2015-11-27 LAB — APTT: aPTT: 30 seconds (ref 24–36)

## 2015-11-27 LAB — PROTIME-INR
INR: 1.17
INR: 1.18
PROTHROMBIN TIME: 15 s (ref 11.4–15.2)
PROTHROMBIN TIME: 15.1 s (ref 11.4–15.2)

## 2015-11-27 LAB — GLUCOSE, CAPILLARY
GLUCOSE-CAPILLARY: 103 mg/dL — AB (ref 65–99)
GLUCOSE-CAPILLARY: 125 mg/dL — AB (ref 65–99)
GLUCOSE-CAPILLARY: 127 mg/dL — AB (ref 65–99)
GLUCOSE-CAPILLARY: 146 mg/dL — AB (ref 65–99)
GLUCOSE-CAPILLARY: 176 mg/dL — AB (ref 65–99)
GLUCOSE-CAPILLARY: 187 mg/dL — AB (ref 65–99)
GLUCOSE-CAPILLARY: 264 mg/dL — AB (ref 65–99)
GLUCOSE-CAPILLARY: 351 mg/dL — AB (ref 65–99)
Glucose-Capillary: 105 mg/dL — ABNORMAL HIGH (ref 65–99)
Glucose-Capillary: 270 mg/dL — ABNORMAL HIGH (ref 65–99)
Glucose-Capillary: 279 mg/dL — ABNORMAL HIGH (ref 65–99)
Glucose-Capillary: 367 mg/dL — ABNORMAL HIGH (ref 65–99)
Glucose-Capillary: 382 mg/dL — ABNORMAL HIGH (ref 65–99)

## 2015-11-27 LAB — MAGNESIUM: MAGNESIUM: 1.5 mg/dL — AB (ref 1.7–2.4)

## 2015-11-27 LAB — CBC
HCT: 29.9 % — ABNORMAL LOW (ref 36.0–46.0)
Hemoglobin: 9.7 g/dL — ABNORMAL LOW (ref 12.0–15.0)
MCH: 30.2 pg (ref 26.0–34.0)
MCHC: 32.4 g/dL (ref 30.0–36.0)
MCV: 93.1 fL (ref 78.0–100.0)
PLATELETS: 72 10*3/uL — AB (ref 150–400)
RBC: 3.21 MIL/uL — ABNORMAL LOW (ref 3.87–5.11)
RDW: 16.1 % — AB (ref 11.5–15.5)
WBC: 3 10*3/uL — ABNORMAL LOW (ref 4.0–10.5)

## 2015-11-27 LAB — GLUCOSE, SEROUS FLUID: GLUCOSE FL: 316 mg/dL

## 2015-11-27 LAB — LACTATE DEHYDROGENASE, PLEURAL OR PERITONEAL FLUID: LD, Fluid: 43 U/L — ABNORMAL HIGH (ref 3–23)

## 2015-11-27 LAB — PROTEIN, BODY FLUID

## 2015-11-27 LAB — GRAM STAIN

## 2015-11-27 LAB — BETA-HYDROXYBUTYRIC ACID: BETA-HYDROXYBUTYRIC ACID: 0.1 mmol/L (ref 0.05–0.27)

## 2015-11-27 LAB — MRSA PCR SCREENING: MRSA by PCR: NEGATIVE

## 2015-11-27 MED ORDER — LORAZEPAM 2 MG/ML IJ SOLN: 2.0000 mg | INTRAMUSCULAR | Status: DC | PRN

## 2015-11-27 MED ORDER — INSULIN ASPART 100 UNIT/ML ~~LOC~~ SOLN
0.0000 [IU] | SUBCUTANEOUS | Status: DC
  Administered 2015-11-27: 2 [IU] via SUBCUTANEOUS
  Administered 2015-11-27: 1 [IU] via SUBCUTANEOUS
  Administered 2015-11-27: 9 [IU] via SUBCUTANEOUS
  Administered 2015-11-27: 2 [IU] via SUBCUTANEOUS
  Administered 2015-11-28: 5 [IU] via SUBCUTANEOUS
  Administered 2015-11-28: 7 [IU] via SUBCUTANEOUS
  Administered 2015-11-28: 1 [IU] via SUBCUTANEOUS
  Administered 2015-11-28: 3 [IU] via SUBCUTANEOUS

## 2015-11-27 MED ORDER — INSULIN ASPART 100 UNIT/ML ~~LOC~~ SOLN: 0.0000 [IU] | Freq: Three times a day (TID) | SUBCUTANEOUS | Status: DC

## 2015-11-27 MED ORDER — INSULIN ASPART 100 UNIT/ML ~~LOC~~ SOLN
9.0000 [IU] | Freq: Three times a day (TID) | SUBCUTANEOUS | Status: DC
  Administered 2015-11-27 – 2015-11-28 (×3): 9 [IU] via SUBCUTANEOUS

## 2015-11-27 MED ORDER — LORAZEPAM 1 MG PO TABS
2.0000 mg | ORAL_TABLET | ORAL | Status: DC | PRN
  Administered 2015-11-27 (×2): 2 mg via ORAL
  Filled 2015-11-27 (×2): qty 2

## 2015-11-27 MED ORDER — MAGNESIUM SULFATE 2 GM/50ML IV SOLN
2.0000 g | Freq: Once | INTRAVENOUS | Status: AC
  Administered 2015-11-27: 2 g via INTRAVENOUS
  Filled 2015-11-27: qty 50

## 2015-11-27 MED ORDER — INSULIN GLARGINE 100 UNIT/ML ~~LOC~~ SOLN: 30.0000 [IU] | Freq: Every day | SUBCUTANEOUS | Status: DC

## 2015-11-27 MED ORDER — POTASSIUM CHLORIDE CRYS ER 20 MEQ PO TBCR
20.0000 meq | EXTENDED_RELEASE_TABLET | Freq: Once | ORAL | Status: AC
  Administered 2015-11-27: 20 meq via ORAL
  Filled 2015-11-27: qty 1

## 2015-11-27 MED ORDER — INSULIN GLARGINE 100 UNIT/ML ~~LOC~~ SOLN
40.0000 [IU] | Freq: Every day | SUBCUTANEOUS | Status: DC
Start: 2015-11-28 — End: 2015-11-28
  Administered 2015-11-28: 40 [IU] via SUBCUTANEOUS
  Filled 2015-11-27: qty 0.4

## 2015-11-27 MED ORDER — POTASSIUM CHLORIDE 10 MEQ/100ML IV SOLN
10.0000 meq | INTRAVENOUS | Status: AC
  Administered 2015-11-27 (×2): 10 meq via INTRAVENOUS
  Filled 2015-11-27 (×2): qty 100

## 2015-11-27 MED ORDER — INSULIN GLARGINE 100 UNIT/ML ~~LOC~~ SOLN
30.0000 [IU] | Freq: Every day | SUBCUTANEOUS | Status: DC
  Administered 2015-11-27: 30 [IU] via SUBCUTANEOUS
  Filled 2015-11-27: qty 0.3

## 2015-11-27 NOTE — Progress Notes (Signed)
PROGRESS NOTE    [name]  [id number] DOB: [birth year] DOA: 11/26/2015 PCP: Cathlean Cower, MD   Brief Narrative:  [name] is a 50 year old female with a history of alcoholic cirrhosis, chronic hepatitis C, insulin-dependent diabetes mellitus, admitted to medicine service on 11/26/2015 when she presented with complaints of abdominal pain associated with abdominal distention and bilateral extremity edema. She reported compliance to her medications including Lasix and spironolactone. She was found to be in HHS, started on an insulin drip and admitted to the step down unit. Lunch sugars stabilizing over the evening. The following morning IV insulin was discontinued as she was transitioned back to her Lantus. Spontaneous bacterial peritonitis suspected, started on antibiotic therapy. Ultrasound guided paracentesis ordered.  Assessment & Plan:   Principal Problem:   Hyperosmolar non-ketotic state in patient with type 2 diabetes mellitus ([place]) Active Problems:   Acute generalized abdominal pain   Ascites due to alcoholic cirrhosis (HCC)   Anxiety and depression   Hyponatremia   Chronic hepatitis C without hepatic coma (H[facility])   SBP (spontaneous bacterial peritonitis) ([place])   Pancytopenia ([place])   Insulin-requiring or dependent type II diabetes mellitus ([place])  1.  Hyperosmotic hyperglycemic state -She presented to the emergency room with blood sugars greater than 600, BMP showing a bicarbonate of 23, without the presence of ketones in urine. -She was started on IV insulin, blood sugars coming down overnight into the 100s. -Now has been transitioned to her home Lantus 30 units subcutaneous daily. -Plan to advance diet and provide sliding scale coverage as needed  2.  Suspected spontaneous bacterial peritonitis -She reported abdominal pain, presenting with elevated blood sugars -Plan for ultrasound-guided paracentesis today -She was started on empiric IV antibiotic therapy with ceftriaxone 2 g  IV every 24 hours  3.  Alcoholic liver disease -Patient with history of cirrhosis, had been on Aldactone 200 mg by mouth daily and Lasix 80 mg by mouth daily -Presenting with decompensated cirrhosis, having what appears to be rapid reaccumulation of her ascites as well as worsening bilateral extremity edema -She is on a Lasix 60 mg IV twice a day -Plan for ultrasound-guided paracentesis  4.  Thrombocytopenia -Labs showing a platelet count of 72,000, likely related to liver disease   DVT prophylaxis: SCD's Code Status: Full code Family Communication: Family not present Disposition Plan:   Consultants:     Procedures:     Antimicrobials:   Ceftriaxone 2 g IV every 24 hours started on 11/26/2015   Subjective: Patient complaining of abdominal pain as well as abdominal distention however reports feeling hungry and would like to eat  Objective: Vitals:   11/26/15 2134 11/27/15 0000 11/27/15 0354 11/27/15 0500  BP:  (!) 131/94 110/70   Pulse:  (!) 125 (!) 119   Resp:  15    Temp: 98.8 F (37.1 C) 97.7 F (36.5 C)    TempSrc: Oral Oral    SpO2:  92%    Weight: 51.7 kg (113 lb 15.7 oz)   52.6 kg (115 lb 15.4 oz)  Height: 5\' 2"  (1.575 m)       Intake/Output Summary (Last 24 hours) at 11/27/15 0749 Last data filed at 11/27/15 0600  Gross per 24 hour  Intake           468.99 ml  Output             1700 ml  Net         -1231.01 ml   [employer]  11/26/15 2134 11/27/15 0500  Weight: 51.7 kg (113 lb 15.7 oz) 52.6 kg (115 lb 15.4 oz)    Examination:  General exam: Nontoxic appearing awake and alert Respiratory system: Clear to auscultation. Respiratory effort normal. Cardiovascular system: S1 & S2 heard, RRR. No JVD, murmurs, rubs, gallops or clicks. No pedal edema. Gastrointestinal system: Patient having generalized tenderness to palpation as well as abdominal distention and fluid wave present Central nervous system: Alert and oriented. No focal neurological  deficits. Extremities: He has bilateral 2+ pedal edema Skin: No rashes, lesions or ulcers Psychiatry: Judgement and insight appear normal. Mood & affect appropriate.     Data Reviewed: I have personally reviewed following labs and imaging studies  CBC:  Recent Labs Lab 11/26/15 1820 11/27/15 0504  WBC 3.7* 3.0*  HGB 10.8* 9.7*  HCT 34.2* 29.9*  MCV 98.0 93.1  PLT 105* 72*   Basic Metabolic Panel:  Recent Labs Lab 11/26/15 1820 11/27/15 0009 11/27/15 0326 11/27/15 0504  NA 132* 137 138 137  K 3.9 3.3* 3.7 3.8  CL 93* 95* 96* 94*  CO2 23 31 33* 32  GLUCOSE 749* 371* 88 131*  BUN 12 9 9 9   CREATININE 0.54 0.56 0.35* 0.40*  CALCIUM 9.4 9.9 9.5 9.3  MG  --  1.5*  --   --    GFR: Estimated Creatinine Clearance: 66.5 mL/min (by C-G formula based on SCr of 0.8 mg/dL). Liver Function Tests:  Recent Labs Lab 11/26/15 1820 11/27/15 0504  AST 53* 50*  ALT 56* 44  ALKPHOS 177* 133*  BILITOT 1.7* 2.2*  PROT 8.1 8.0  ALBUMIN 3.1* 3.9    Recent Labs Lab 11/26/15 1820  LIPASE 69*    Recent Labs Lab 11/26/15 1905  AMMONIA 51*   Coagulation Profile:  Recent Labs Lab 11/27/15 0009 11/27/15 0504  INR 1.17 1.18   Cardiac Enzymes: No results for input(s): CKTOTAL, CKMB, CKMBINDEX, TROPONINI in the last 168 hours. BNP (last 3 results) No results for input(s): PROBNP in the last 8760 hours. HbA1C: No results for input(s): HGBA1C in the last 72 hours. CBG:  Recent Labs Lab 11/27/15 0129 11/27/15 0237 11/27/15 0341 11/27/15 0441 11/27/15 0547  GLUCAP 270* 127* 103* 125* 146*   Lipid Profile: No results for input(s): CHOL, HDL, LDLCALC, TRIG, CHOLHDL, LDLDIRECT in the last 72 hours. Thyroid Function Tests: No results for input(s): TSH, T4TOTAL, FREET4, T3FREE, THYROIDAB in the last 72 hours. Anemia Panel: No results for input(s): VITAMINB12, FOLATE, FERRITIN, TIBC, IRON, RETICCTPCT in the last 72 hours. Sepsis Labs: No results for input(s):  PROCALCITON, LATICACIDVEN in the last 168 hours.  Recent Results (from the past 240 hour(s))  MRSA PCR Screening     Status: None   Collection Time: 11/26/15  9:39 PM  Result Value Ref Range Status   MRSA by PCR NEGATIVE NEGATIVE Final    Comment:        The GeneXpert MRSA Assay (FDA approved for NASAL specimens only), is one component of a comprehensive MRSA colonization surveillance program. It is not intended to diagnose MRSA infection nor to guide or monitor treatment for MRSA infections.          Radiology Studies: Dg Chest 2 View  Result Date: 11/26/2015 CLINICAL DATA:  Shortness of breath today. EXAM: CHEST  2 VIEW COMPARISON:  PA and lateral chest 05/23/2015 and 03/29/2015. FINDINGS: The mild linear atelectasis is seen in the left lung base. The lungs are otherwise clear. Heart size is normal. No pneumothorax  or pleural effusion. No focal bony abnormality. IMPRESSION: No acute disease. Electronically Signed   By: Inge Rise M.D.   On: 11/26/2015 18:46        Scheduled Meds: . atenolol  12.5 mg Oral Daily  . cefTRIAXone (ROCEPHIN)  IV  2 g Intravenous Q24H  . folic acid  1 mg Oral Daily  . furosemide  60 mg Intravenous Q12H  . insulin aspart  0-9 Units Subcutaneous Q4H  . insulin glargine  30 Units Subcutaneous Daily  . lactulose  20 g Oral TID  . multivitamin with minerals  1 tablet Oral Daily  . pantoprazole  40 mg Oral BID  . sertraline  150 mg Oral Daily  . sodium chloride flush  3 mL Intravenous Q12H  . sodium chloride flush  3 mL Intravenous Q12H  . spironolactone  200 mg Oral Daily  . thiamine  100 mg Oral Daily   Continuous Infusions: . sodium chloride Stopped (11/27/15 0240)     LOS: 1 day    Time spent: 29 min    Kelvin Cellar, MD [facility] Pager [phone]  If 7PM-7AM, please contact night-coverage [email or web address] Password [facility] 11/27/2015, 7:49 AM

## 2015-11-27 NOTE — Procedures (Signed)
Ultrasound-guided diagnostic and therapeutic paracentesis performed yielding 2.1 liters of yellow colored fluid. No immediate complications.  Loudon Krakow E 3:34 PM 11/27/2015

## 2015-11-27 NOTE — Progress Notes (Signed)
MD verified okay to give patient 60mg  IV lasix and 200 mg po of aldactone. Aware last BP 166/84. Will continue to monitor patient.

## 2015-11-27 NOTE — Progress Notes (Signed)
Per MD okay for patient to travel to paracentesis without nurse.

## 2015-11-27 NOTE — Progress Notes (Signed)
Inpatient Diabetes Program Recommendations  AACE/ADA: New Consensus Statement on Inpatient Glycemic Control (2015)  Target Ranges:  Prepandial:   less than 140 mg/dL      Peak postprandial:   less than 180 mg/dL (1-2 hours)      Critically ill patients:  140 - 180 mg/dL   Lab Results  Component Value Date   GLUCAP 382 (H) 11/27/2015   HGBA1C 12.8 (H) 10/22/2015    Review of Glycemic Control  Diabetes history: DM2 Outpatient Diabetes medications: Lantus 40 units QHS, Novolog 9 units tidwc, metformin 500 mg bid Current orders for Inpatient glycemic control: Lantus 30 units QD, Novolog sensitive Q4H   Inpatient Diabetes Program Recommendations:    Increase Lantus to 35 units QD Add Novolog 6 units tidwc for meal coverage insulin. Change Novolog sensitive to tidwc and hs.  Spoke with pt briefly about her glycemic control. Pt states she checks blood sugars at least 3x/day and always takes her insulin.States she takes 40 units of Lantus and 9 units tidwc of Novolog daily. Has f/u appt with PCP in a couple of weeks.   Will continue to follow. Thank you. Lorenda Peck, RD, LDN, CDE Inpatient Diabetes Coordinator [phone]

## 2015-11-28 DIAGNOSIS — K7031 Alcoholic cirrhosis of liver with ascites: Secondary | ICD-10-CM

## 2015-11-28 LAB — CBC
HEMATOCRIT: 33.7 % — AB (ref 36.0–46.0)
HEMOGLOBIN: 10.8 g/dL — AB (ref 12.0–15.0)
MCH: 30.4 pg (ref 26.0–34.0)
MCHC: 32 g/dL (ref 30.0–36.0)
MCV: 94.9 fL (ref 78.0–100.0)
Platelets: 78 10*3/uL — ABNORMAL LOW (ref 150–400)
RBC: 3.55 MIL/uL — AB (ref 3.87–5.11)
RDW: 16.5 % — ABNORMAL HIGH (ref 11.5–15.5)
WBC: 2.6 10*3/uL — ABNORMAL LOW (ref 4.0–10.5)

## 2015-11-28 LAB — COMPREHENSIVE METABOLIC PANEL
ALBUMIN: 3.4 g/dL — AB (ref 3.5–5.0)
ALK PHOS: 145 U/L — AB (ref 38–126)
ALT: 41 U/L (ref 14–54)
AST: 61 U/L — AB (ref 15–41)
Anion gap: 7 (ref 5–15)
BILIRUBIN TOTAL: 1.5 mg/dL — AB (ref 0.3–1.2)
BUN: 13 mg/dL (ref 6–20)
CALCIUM: 9 mg/dL (ref 8.9–10.3)
CO2: 32 mmol/L (ref 22–32)
CREATININE: 0.53 mg/dL (ref 0.44–1.00)
Chloride: 97 mmol/L — ABNORMAL LOW (ref 101–111)
GFR calc Af Amer: >60 mL/min (ref >60)
GFR calc non Af Amer: >60 mL/min (ref >60)
GLUCOSE: 156 mg/dL — AB (ref 65–99)
Potassium: 3.7 mmol/L (ref 3.5–5.1)
SODIUM: 136 mmol/L (ref 135–145)
TOTAL PROTEIN: 7.4 g/dL (ref 6.5–8.1)

## 2015-11-28 LAB — GLUCOSE, CAPILLARY
Glucose-Capillary: 144 mg/dL — ABNORMAL HIGH (ref 65–99)
Glucose-Capillary: 239 mg/dL — ABNORMAL HIGH (ref 65–99)
Glucose-Capillary: 339 mg/dL — ABNORMAL HIGH (ref 65–99)
Glucose-Capillary: 108 mg/dL — ABNORMAL HIGH (ref 65–99)

## 2015-11-28 LAB — PATHOLOGIST SMEAR REVIEW

## 2015-11-28 MED ORDER — SPIRONOLACTONE 100 MG PO TABS: 200.0000 mg | ORAL_TABLET | Freq: Every day | ORAL | 1 refills | Status: AC

## 2015-11-28 MED ORDER — CEFUROXIME AXETIL 500 MG PO TABS: 500.0000 mg | ORAL_TABLET | Freq: Two times a day (BID) | ORAL | 0 refills | Status: DC

## 2015-11-28 MED ORDER — FUROSEMIDE 40 MG PO TABS: 80.0000 mg | ORAL_TABLET | Freq: Every day | ORAL | 1 refills | Status: AC

## 2015-11-28 NOTE — Discharge Summary (Signed)
Physician Discharge Summary  [name] [id number] DOB: 1965-12-18 DOA: 11/26/2015  PCP: Cathlean Cower, MD  Admit date: 11/26/2015 Discharge date: 11/28/2015  Time spent: 35 minutes  Recommendations for Outpatient Follow-up:  1. Please follow-up on viral status, during this hospitalization she underwent paracentesis, removal of 2.1 L of ascitic fluid  2. Please follow-up on blood sugars, she was admitted for [facility]    Discharge Diagnoses:  Principal Problem:   Hyperosmolar non-ketotic state in patient with type 2 diabetes mellitus ([place]) Active Problems:   Acute generalized abdominal pain   Ascites due to alcoholic cirrhosis ([place])   Anxiety and depression   Hyponatremia   Chronic hepatitis C without hepatic coma ([facility])   SBP (spontaneous bacterial peritonitis) ([place])   Pancytopenia ([place])   Insulin-requiring or dependent type II diabetes mellitus ([place])   Discharge Condition: Stable  Diet recommendation: Low sodium fluid restricted diet  Filed Weights   11/26/15 2134 11/27/15 0500 11/28/15 0500  Weight: 51.7 kg (113 lb 15.7 oz) 52.6 kg (115 lb 15.4 oz) 47.7 kg (105 lb 2.6 oz)    History of present illness:   [name] is a 50 y.o. female with medical history significant for alcoholic liver cirrhosis, chronic hepatitis C, depression, insulin-dependent diabetes mellitus, and pancytopenia who presents the emergency department with abdominal pain with swelling of the abdomen and bilateral feet. Patient was discharged from this institution approximately one month ago after being admitted under similar circumstances and undergoing 2 large volume paracenteses. She reports doing well back at home initially, but states that over the past several days there is been rapid reaccumulation of the abdominal ascites and the development of bilateral pedal edema. She reports exquisite tenderness of the abdomen and associated nausea. She denies vomiting, melena, or hematochezia. Patient reports feeling hot  all the time, but denies any fevers per se. She endorses some dyspnea which she attributes to the abdominal distention, but denies cough, chest pain, or palpitations. She endorses severe pain at the bilateral feet which has developed in conjunction with the swelling. She reports continued adherence to all of her medications.  Hospital Course:  [name] is a 50 year old female with a history of alcoholic cirrhosis, chronic hepatitis C, insulin-dependent diabetes mellitus, admitted to medicine service on 11/26/2015 when she presented with complaints of abdominal pain associated with abdominal distention and bilateral extremity edema. She reported compliance to her medications including Lasix and spironolactone. She was found to be in HHS, started on an insulin drip and admitted to the step down unit. Lunch sugars stabilizing over the evening. The following morning IV insulin was discontinued as she was transitioned back to her Lantus. Spontaneous bacterial peritonitis suspected, started on antibiotic therapy. Ultrasound guided paracentesis ordered.  1.  Hyperosmotic hyperglycemic state -She presented to the emergency room with blood sugars greater than 600, BMP showing a bicarbonate of 23, without the presence of ketones in urine. -She was started on IV insulin, blood sugars coming down overnight into the 100s. -Now has been transitioned to her home Lantus 30 units subcutaneous daily. -Plan to advance diet and provide sliding scale coverage as needed -She was discharged on Lantus 40 units subcutaneous at bedtime and NovoLog 9 units 3 times a day  2.  Suspected spontaneous bacterial peritonitis -She reported abdominal pain, presenting with elevated blood sugars -Plan for ultrasound-guided paracentesis today -She was started on empiric IV antibiotic therapy with ceftriaxone 2 g IV every 24 hours -She was discharged on Ceftin 500 mg by mouth twice a  day x 5 days  3.  Alcoholic liver  disease -Patient with history of cirrhosis, had been on Aldactone 200 mg by mouth daily and Lasix 80 mg by mouth daily -Presenting with decompensated cirrhosis, having what appears to be rapid reaccumulation of her ascites as well as worsening bilateral extremity edema -On 11/27/2015 she underwent ultrasound guided paracentesis with removal of 2.5 L of fluid -Discharged on Ceftin for possible spontaneous bacterial peritonitis  4.  Thrombocytopenia -Labs showing a platelet count of 72,000, likely related to liver disease  Procedures:  Ultrasound-guided paracentesis performed on 11/27/2015  Discharge Exam: Vitals:   11/28/15 0959 11/28/15 1319  BP: 114/83 104/68  Pulse: (!) 110 84  Resp: 16 16  Temp: 98.5 F (36.9 C) 98.5 F (36.9 C)    General exam: Nontoxic appearing awake and alert Respiratory system: Clear to auscultation. Respiratory effort normal. Cardiovascular system: S1 & S2 heard, RRR. No JVD, murmurs, rubs, gallops or clicks. No pedal edema. Gastrointestinal system: Patient having generalized tenderness to palpation as well as abdominal distention and fluid wave present Central nervous system: Alert and oriented. No focal neurological deficits. Extremities: He has bilateral 2+ pedal edema Skin: No rashes, lesions or ulcers Psychiatry: Judgement and insight appear normal. Mood & affect appropriate.   Discharge Instructions   Discharge Instructions    Call MD for:    Complete by:  As directed   Call MD for:    Complete by:  As directed   Call MD for:  difficulty breathing, headache or visual disturbances    Complete by:  As directed   Call MD for:  difficulty breathing, headache or visual disturbances    Complete by:  As directed   Call MD for:  extreme fatigue    Complete by:  As directed   Call MD for:  extreme fatigue    Complete by:  As directed   Call MD for:  hives    Complete by:  As directed   Call MD for:  hives    Complete by:  As directed   Call MD for:   persistant dizziness or light-headedness    Complete by:  As directed   Call MD for:  persistant dizziness or light-headedness    Complete by:  As directed   Call MD for:  persistant nausea and vomiting    Complete by:  As directed   Call MD for:  persistant nausea and vomiting    Complete by:  As directed   Call MD for:  redness, tenderness, or signs of infection (pain, swelling, redness, odor or green/yellow discharge around incision site)    Complete by:  As directed   Call MD for:  redness, tenderness, or signs of infection (pain, swelling, redness, odor or green/yellow discharge around incision site)    Complete by:  As directed   Call MD for:  severe uncontrolled pain    Complete by:  As directed   Call MD for:  severe uncontrolled pain    Complete by:  As directed   Call MD for:  temperature >100.4    Complete by:  As directed   Call MD for:  temperature >100.4    Complete by:  As directed   Diet - low sodium heart healthy    Complete by:  As directed   Diet - low sodium heart healthy    Complete by:  As directed   Increase activity slowly    Complete by:  As directed  Increase activity slowly    Complete by:  As directed     Current Discharge Medication List    START taking these medications   Details  cefUROXime (CEFTIN) 500 MG tablet Take 1 tablet (500 mg total) by mouth 2 (two) times daily with a meal. Qty: 10 tablet, Refills: 0      CONTINUE these medications which have CHANGED   Details  furosemide (LASIX) 40 MG tablet Take 2 tablets (80 mg total) by mouth daily. Qty: 60 tablet, Refills: 1    spironolactone (ALDACTONE) 100 MG tablet Take 2 tablets (200 mg total) by mouth daily. Qty: 60 tablet, Refills: 1      CONTINUE these medications which have NOT CHANGED   Details  atenolol (TENORMIN) 25 MG tablet Take 0.5 tablets (12.5 mg total) by mouth daily. Qty: 30 tablet, Refills: 1    calcium carbonate (TUMS EX) 750 MG chewable tablet Chew 3 tablets by mouth 4 (four)  times daily as needed for heartburn.    folic acid (FOLVITE) 1 MG tablet Take 1 tablet (1 mg total) by mouth daily. Qty: 30 tablet, Refills: 0    insulin aspart (NOVOLOG FLEXPEN) 100 UNIT/ML FlexPen Inject 9 Units into the skin 3 (three) times daily with meals.    insulin glargine (LANTUS) 100 unit/mL SOPN Inject 40 Units into the skin at bedtime.    lactulose (CHRONULAC) 10 GM/15ML solution Take 20 g by mouth 3 (three) times daily as needed for mild constipation.    metFORMIN (GLUCOPHAGE) 500 MG tablet Take 500 mg by mouth 2 (two) times daily with a meal.    Multiple Vitamin (MULTIVITAMIN WITH MINERALS) TABS tablet Take 1 tablet by mouth daily.     pantoprazole (PROTONIX) 40 MG tablet Take 1 tablet (40 mg total) by mouth 2 (two) times daily. Qty: 60 tablet, Refills: 1    potassium chloride (K-DUR,KLOR-CON) 10 MEQ tablet Take 10 mEq by mouth daily.    sertraline (ZOLOFT) 100 MG tablet Take 150 mg by mouth daily.    thiamine 100 MG tablet Take 1 tablet (100 mg total) by mouth daily. Qty: 30 tablet, Refills: 0    nystatin cream (MYCOSTATIN) Apply topically 3 (three) times daily. Apply to groin area and inner thighs Qty: 30 g, Refills: 0    oxyCODONE (OXY IR/ROXICODONE) 5 MG immediate release tablet Take 1 tablet (5 mg total) by mouth every 6 (six) hours as needed for severe pain. Qty: 10 tablet, Refills: 0      STOP taking these medications     diphenhydrAMINE (BENADRYL) 25 MG tablet        Allergies  Allergen Reactions  . Other Itching and Other (See Comments)    Pt states that she is allergic to CHG wipes.    . Betadine [Povidone Iodine] Hives   Follow-up Information    Silvano Rusk, MD .   Specialty:  Gastroenterology Contact information: [address] [place] [address] [phone]            The results of significant diagnostics from this hospitalization (including imaging, microbiology, ancillary and laboratory) are listed below for reference.     Significant Diagnostic Studies: Dg Chest 2 View  Result Date: 11/26/2015 CLINICAL DATA:  Shortness of breath today. EXAM: CHEST  2 VIEW COMPARISON:  PA and lateral chest 05/23/2015 and 03/29/2015. FINDINGS: The mild linear atelectasis is seen in the left lung base. The lungs are otherwise clear. Heart size is normal. No pneumothorax or pleural effusion. No  focal bony abnormality. IMPRESSION: No acute disease. Electronically Signed   By: Inge Rise M.D.   On: 11/26/2015 18:46   US Paracentesis  Result Date: 11/27/2015 INDICATION: Cirrhosis with recurrent abdominal ascites EXAM: ULTRASOUND GUIDED DIAGNOSTIC AND THERAPEUTIC PARACENTESIS MEDICATIONS: 1% lidocaine. COMPLICATIONS: None immediate. PROCEDURE: Informed written consent was obtained from the patient after a discussion of the risks, benefits and alternatives to treatment. A timeout was performed prior to the initiation of the procedure. Initial ultrasound scanning demonstrates a small amount of ascites within the right upper abdominal quadrant. The right upper abdomen was prepped and draped in the usual sterile fashion. 1% lidocaine was used for local anesthesia. Following this, a 19 gauge, 7-cm, Yueh catheter was introduced. An ultrasound image was saved for documentation purposes. The paracentesis was performed. The catheter was removed and a dressing was applied. The patient tolerated the procedure well without immediate post procedural complication. FINDINGS: A total of approximately 2.1 L of yellow fluid was removed. Samples were sent to the laboratory as requested by the clinical team. IMPRESSION: Successful ultrasound-guided paracentesis yielding 2.1 liters of peritoneal fluid. Read by: Saverio Danker, PA-C Electronically Signed   By: Aletta Edouard M.D.   On: 11/27/2015 15:33    Microbiology: Recent Results (from the past 240 hour(s))  MRSA PCR Screening     Status: None   Collection Time: 11/26/15  9:39 PM  Result Value Ref Range  Status   MRSA by PCR NEGATIVE NEGATIVE Final    Comment:        The GeneXpert MRSA Assay (FDA approved for NASAL specimens only), is one component of a comprehensive MRSA colonization surveillance program. It is not intended to diagnose MRSA infection nor to guide or monitor treatment for MRSA infections.   Gram stain     Status: None   Collection Time: 11/27/15  1:55 PM  Result Value Ref Range Status   Specimen Description FLUID PERITONEAL  Final   Special Requests NONE  Final   Gram Stain   Final    RARE WBC PRESENT, PREDOMINANTLY MONONUCLEAR NO ORGANISMS SEEN Performed at [facility]    Report Status 11/27/2015 FINAL  Final     Labs: Basic Metabolic Panel:  Recent Labs Lab 11/26/15 1820 11/27/15 0009 11/27/15 0326 11/27/15 0504 11/28/15 0506  NA 132* 137 138 137 136  K 3.9 3.3* 3.7 3.8 3.7  CL 93* 95* 96* 94* 97*  CO2 23 31 33* 32 32  GLUCOSE 749* 371* 88 131* 156*  BUN 12 9 9 9 13   CREATININE 0.54 0.56 0.35* 0.40* 0.53  CALCIUM 9.4 9.9 9.5 9.3 9.0  MG  --  1.5*  --   --   --    Liver Function Tests:  Recent Labs Lab 11/26/15 1820 11/27/15 0504 11/28/15 0506  AST 53* 50* 61*  ALT 56* 44 41  ALKPHOS 177* 133* 145*  BILITOT 1.7* 2.2* 1.5*  PROT 8.1 8.0 7.4  ALBUMIN 3.1* 3.9 3.4*    Recent Labs Lab 11/26/15 1820  LIPASE 69*    Recent Labs Lab 11/26/15 1905  AMMONIA 51*   CBC:  Recent Labs Lab 11/26/15 1820 11/27/15 0504 11/28/15 0506  WBC 3.7* 3.0* 2.6*  HGB 10.8* 9.7* 10.8*  HCT 34.2* 29.9* 33.7*  MCV 98.0 93.1 94.9  PLT 105* 72* 78*   Cardiac Enzymes: No results for input(s): CKTOTAL, CKMB, CKMBINDEX, TROPONINI in the last 168 hours. BNP: BNP (last 3 results)  Recent Labs  05/23/15 2139  BNP  21.8    ProBNP (last 3 results) No results for input(s): PROBNP in the last 8760 hours.  CBG:  Recent Labs Lab 11/27/15 2111 11/27/15 2354 11/28/15 0453 11/28/15 0757 11/28/15 1202  GLUCAP 176* 279* 144* 239*  339*       Signed:  Kelvin Cellar MD.  [facility] 11/28/2015, 1:26 PM

## 2015-11-28 NOTE — Progress Notes (Signed)
Inpatient Diabetes Program Recommendations  AACE/ADA: New Consensus Statement on Inpatient Glycemic Control (2015)  Target Ranges:  Prepandial:   less than 140 mg/dL      Peak postprandial:   less than 180 mg/dL (1-2 hours)      Critically ill patients:  140 - 180 mg/dL   Lab Results  Component Value Date   GLUCAP 108 (H) 11/28/2015   HGBA1C 12.8 (H) 10/22/2015    Review of Glycemic Control  Results for ANETHA, KORST (MRN [id number]) as of 11/28/2015 16:40  Ref. Range 11/27/2015 23:54 11/28/2015 04:53 11/28/2015 07:57 11/28/2015 12:02 11/28/2015 16:07  Glucose-Capillary Latest Ref Range: 65 - 99 mg/dL 279 (H) 5 units correction 144 (H) 1 unit correciton 239 (H) 3 units correcion and 9 units meal coverage  339 (H) 3 units correcion and 9 units meal coverage 108 (H)     Diabetes history: DM  Outpatient Diabetes medications: Lantus 40 units daily and 9 units meal coverage novolog tidwc Current orders for Inpatient glycemic control:Lantus 40 units, 9 units meal coverage tidwc and sensitive correction q 4 hrs.  Inpatient Diabetes Program Recommendations:   Patient on home dose of lantus of 40 units and 9 units meal coverage as at home. Also ordered sensitive correction q 4 hrs. Patient eating 100% during the day and lantus covering 24 hr basal needs. Please change correction to tidwc  Thank you Rosita Kea, RN, MSN, CDE  Diabetes Inpatient Program Office: [phone] Pager: [phone] 8:00 am to 5:00 pm

## 2015-12-02 LAB — CULTURE, BODY FLUID-BOTTLE: CULTURE: NO GROWTH

## 2015-12-02 LAB — CULTURE, BODY FLUID W GRAM STAIN -BOTTLE

## 2015-12-08 ENCOUNTER — Emergency Department (HOSPITAL_COMMUNITY)
Admission: EM | Admit: 2015-12-08 | Discharge: 2015-12-09 | Disposition: A | Discharge status: Home / Self Care | Payer: Medicaid Other | Attending: Emergency Medicine | Admitting: Emergency Medicine

## 2015-12-08 DIAGNOSIS — Z7289 Other problems related to lifestyle: Secondary | ICD-10-CM

## 2015-12-08 DIAGNOSIS — Z79899 Other long term (current) drug therapy: Secondary | ICD-10-CM | POA: Insufficient documentation

## 2015-12-08 DIAGNOSIS — F1010 Alcohol abuse, uncomplicated: Secondary | ICD-10-CM | POA: Insufficient documentation

## 2015-12-08 DIAGNOSIS — Z7984 Long term (current) use of oral hypoglycemic drugs: Secondary | ICD-10-CM | POA: Insufficient documentation

## 2015-12-08 DIAGNOSIS — R1084 Generalized abdominal pain: Secondary | ICD-10-CM | POA: Diagnosis present

## 2015-12-08 DIAGNOSIS — E1165 Type 2 diabetes mellitus with hyperglycemia: Secondary | ICD-10-CM | POA: Insufficient documentation

## 2015-12-08 DIAGNOSIS — Z794 Long term (current) use of insulin: Secondary | ICD-10-CM | POA: Insufficient documentation

## 2015-12-08 DIAGNOSIS — F1090 Alcohol use, unspecified, uncomplicated: Secondary | ICD-10-CM

## 2015-12-08 DIAGNOSIS — R109 Unspecified abdominal pain: Secondary | ICD-10-CM

## 2015-12-08 DIAGNOSIS — R739 Hyperglycemia, unspecified: Secondary | ICD-10-CM

## 2015-12-08 LAB — CBG MONITORING, ED: GLUCOSE-CAPILLARY: 458 mg/dL — AB (ref 65–99)

## 2015-12-08 MED ORDER — INSULIN ASPART 100 UNIT/ML ~~LOC~~ SOLN
10.0000 [IU] | Freq: Once | SUBCUTANEOUS | Status: AC
  Administered 2015-12-09: 10 [IU] via INTRAVENOUS
  Filled 2015-12-08: qty 1

## 2015-12-08 MED ORDER — SODIUM CHLORIDE 0.9 % IV BOLUS (SEPSIS)
1000.0000 mL | Freq: Once | INTRAVENOUS | Status: AC
  Administered 2015-12-09: 1000 mL via INTRAVENOUS

## 2015-12-08 MED ORDER — KETOROLAC TROMETHAMINE 30 MG/ML IJ SOLN
15.0000 mg | Freq: Once | INTRAMUSCULAR | Status: DC
Start: 2015-12-09 — End: 2015-12-08

## 2015-12-08 NOTE — ED Provider Notes (Signed)
[facility] DEPT Provider Note   CSN: [id number] Arrival date & time: 12/08/15  2306  By signing my name below, I, [name], attest that this documentation has been prepared under the direction and in the presence of Leo Grosser, MD. Electronically Signed: Gwenlyn [name], ED Scribe. 12/08/15. 12:34 AM.   History   Chief Complaint Chief Complaint  Patient presents with  . Hyperglycemia   The history is provided by the patient. No language interpreter was used.    HPI Comments: [name] is a 50 y.o. female with PMHx of Cirrhosis, DM, Ascites who presents to the Emergency Department by EMS complaining of gradual onset, constant right sided abdominal pain onset tonight. Pt was Hyperglycemic when she presented to the ED. She also reports a laceration on her right second toe with associated pain, but is unaware of how she obtained the wound. She states she is compliant with her Insulin. She states her Novolog therapy causes her cramps. Pt states she has had about 3 beers tonight. Pt states she vomited the other day but not today. She states she has been advised to increase the dosages of her Lantus. Pt denies fever, urinary symptoms, gait problems, decreased concentration or confusion. Pt denies any other complaints at this time.  Past Medical History:  Diagnosis Date  . Abnormal vaginal bleeding    uterine fibroid  . Alcohol dependency ([place])    at least [facility] admissions 2007- 09/2013 for detox.   Marland Kitchen Anxiety   . Chronic abdominal pain 2011  . Chronic constipation   . Cirrhosis ([place]) 11/09/2010  . Drug dependency ([place])    Hx of cocaine use  . Gallstone 2006  . GERD (gastroesophageal reflux disease)   . Hemochromatosis    last phlebotomy tx ~ 2012. liver bx 2006  . Hepatitis C approx dx 2005   no tx to date  . Major depression ([place]) 11/2012, 09/2013   [facility] admissions for this, suicide attempt by OD Ambien (2014), Prozac (2015),ETOHism   . Pancreatitis    HX of  . Pancytopenia   .  Rheumatoid factor positive   . Type II or unspecified type diabetes mellitus without mention of complication, uncontrolled 09/17/2011    Patient Active Problem List   Diagnosis Date Noted  . Insulin-requiring or dependent type II diabetes mellitus ([place]) 11/26/2015  . Hyperosmolar non-ketotic state in patient with type 2 diabetes mellitus ([place]) 11/26/2015  . SBP (spontaneous bacterial peritonitis) ([place]) 10/22/2015  . Sepsis ([place]) 10/22/2015  . Pancytopenia ([place]) 10/22/2015  . Diabetes mellitus type II, uncontrolled ([place]) 10/22/2015  . Chronic hepatitis C without hepatic coma ([place]) 08/24/2015  . Absolute anemia   . Hyponatremia   . Erosive gastritis   . Secondary esophageal varices without bleeding ([place])   . Intractable abdominal pain 08/07/2015  . Anxiety and depression 08/07/2015  . GERD (gastroesophageal reflux disease) 08/07/2015  . Anemia 08/07/2015  . Leukopenia 05/23/2015  . Acute generalized abdominal pain   . Ascites due to alcoholic cirrhosis ([place])   . Alcoholic cirrhosis of liver with ascites ([place]) 03/29/2015  . Poorly controlled type 2 diabetes mellitus ([place]) 03/29/2015  . Ascites 01/03/2015  . Protein-calorie malnutrition, severe ([place]) 01/03/2015  . Abdominal pain 01/03/2015  . Peritonitis with effusion ([place]) 11/09/2014  . Gallstones   . Elevated LFTs 07/20/2014  . Severe recurrent major depression without psychotic features ([place]) 07/19/2014  . Wellness examination 07/14/2014  . Colon polyps 02/18/2014  . Rectal varices - small 02/18/2014  . Thrombocytopenia (  [place]) 02/14/2014  . Acute alcohol intoxication ([place]) 12/09/2013  . MDD (major depressive disorder) ([place]) 09/23/2013  . Alcohol dependence ([place]) 12/17/2012  . Cocaine abuse 12/17/2012    Past Surgical History:  Procedure Laterality Date  . ABDOMINAL HYSTERECTOMY  12/17/2011   Procedure: HYSTERECTOMY ABDOMINAL;  Surgeon: Gus Height, MD;  Location: [facility];  Service: Gynecology;  Laterality: N/A;  .  APPENDECTOMY    . CESAREAN SECTION  x 2  . COLONOSCOPY N/A 02/18/2014   Procedure: COLONOSCOPY;  Surgeon: Gatha Mayer, MD;  Location: [facility];  Service: Endoscopy;  Laterality: N/A;  . ESOPHAGOGASTRODUODENOSCOPY N/A 12/10/2013   Procedure: ESOPHAGOGASTRODUODENOSCOPY (EGD);  Surgeon: Ladene Artist, MD;  Location: [facility];  Service: Endoscopy;  Laterality: N/A;  . ESOPHAGOGASTRODUODENOSCOPY (EGD) WITH PROPOFOL N/A 08/08/2015   Procedure: ESOPHAGOGASTRODUODENOSCOPY (EGD) WITH PROPOFOL;  Surgeon: Gatha Mayer, MD;  Location: [facility];  Service: Endoscopy;  Laterality: N/A;  . FOOT SURGERY  2013  . PERCUTANEOUS LIVER BIOPSY  2011  . SALPINGOOPHORECTOMY  12/17/2011   Procedure: SALPINGO OOPHERECTOMY;  Surgeon: Gus Height, MD;  Location: [facility];  Service: Gynecology;  Laterality: Bilateral;    OB History    Gravida Para Term Preterm AB Living   2 2 2     3    SAB TAB Ectopic Multiple Live Births                   Home Medications    Prior to Admission medications   Medication Sig Start Date End Date Taking? Authorizing Provider  atenolol (TENORMIN) 25 MG tablet Take 0.5 tablets (12.5 mg total) by mouth daily. 08/10/15   Barton Dubois, MD  calcium carbonate (TUMS EX) 750 MG chewable tablet Chew 3 tablets by mouth 4 (four) times daily as needed for heartburn.    Historical Provider, MD  cefUROXime (CEFTIN) 500 MG tablet Take 1 tablet (500 mg total) by mouth 2 (two) times daily with a meal. 11/28/15   Kelvin Cellar, MD  folic acid (FOLVITE) 1 MG tablet Take 1 tablet (1 mg total) by mouth daily. 05/27/15   Lavina Hamman, MD  furosemide (LASIX) 40 MG tablet Take 2 tablets (80 mg total) by mouth daily. 11/28/15   Kelvin Cellar, MD  insulin aspart (NOVOLOG FLEXPEN) 100 UNIT/ML FlexPen Inject 9 Units into the skin 3 (three) times daily with meals.    Historical Provider, MD  insulin glargine (LANTUS) 100 unit/mL SOPN Inject 40 Units into the skin at bedtime.    Historical Provider,  MD  lactulose (CHRONULAC) 10 GM/15ML solution Take 20 g by mouth 3 (three) times daily as needed for mild constipation.    Historical Provider, MD  metFORMIN (GLUCOPHAGE) 500 MG tablet Take 500 mg by mouth 2 (two) times daily with a meal.    Historical Provider, MD  Multiple Vitamin (MULTIVITAMIN WITH MINERALS) TABS tablet Take 1 tablet by mouth daily.     Historical Provider, MD  nystatin cream (MYCOSTATIN) Apply topically 3 (three) times daily. Apply to groin area and inner thighs Patient not taking: Reported on 10/21/2015 08/10/15   Barton Dubois, MD  oxyCODONE (OXY IR/ROXICODONE) 5 MG immediate release tablet Take 1 tablet (5 mg total) by mouth every 6 (six) hours as needed for severe pain. Patient not taking: Reported on 10/21/2015 08/24/15   Gatha Mayer, MD  pantoprazole (PROTONIX) 40 MG tablet Take 1 tablet (40 mg total) by mouth 2 (two) times daily. 08/10/15   Barton Dubois, MD  potassium  chloride (K-DUR,KLOR-CON) 10 MEQ tablet Take 10 mEq by mouth daily.    Historical Provider, MD  sertraline (ZOLOFT) 100 MG tablet Take 150 mg by mouth daily.    Historical Provider, MD  spironolactone (ALDACTONE) 100 MG tablet Take 2 tablets (200 mg total) by mouth daily. 11/28/15   Kelvin Cellar, MD  thiamine 100 MG tablet Take 1 tablet (100 mg total) by mouth daily. 05/27/15   Lavina Hamman, MD    Family History Family History  Problem Relation Age of Onset  . Cancer Mother     ovarian  . Diabetes Father     Social History Social History  Substance Use Topics  . Smoking status: Never Smoker  . Smokeless tobacco: Never Used  . Alcohol use Yes     Comment: one pint vodka daily years    Allergies   Other and Betadine [povidone iodine]   Review of Systems Review of Systems  Constitutional: Negative for fever.  Gastrointestinal: Positive for abdominal pain and vomiting.  Genitourinary: Negative for decreased urine volume, dysuria, frequency, hematuria and urgency.  Skin: Positive for wound.    Psychiatric/Behavioral: Negative for confusion and decreased concentration.  All other systems reviewed and are negative.  Physical Exam Updated Vital Signs BP 113/83 (BP Location: Right Arm)   Pulse 113   Temp 98.2 F (36.8 C) (Oral)   Resp 16   LMP 11/01/2011   SpO2 97%   Physical Exam  Constitutional: She appears well-developed and well-nourished.  HENT:  Head: Normocephalic.  Eyes: Conjunctivae are normal.  Cardiovascular: Normal rate.   Pulmonary/Chest: Effort normal. No respiratory distress.  Abdominal: She exhibits ascites (small volume without tense abdomen). She exhibits no distension. There is tenderness (mild, diffuse).  Musculoskeletal: Normal range of motion.  Neurological: She is alert.  Skin: Skin is warm and dry.  Psychiatric: She has a normal mood and affect. Her behavior is normal.  Nursing note and vitals reviewed.   ED Treatments / Results  DIAGNOSTIC STUDIES: Oxygen Saturation is 97% on RA, adequate by my interpretation.    COORDINATION OF CARE: 11:40 PM Discussed treatment plan with pt at bedside which includes Toradol and lab work including CBC with Differential and a Comprehensive Metabolic Panel and pt agreed to plan.  Labs (all labs ordered are listed, but only abnormal results are displayed) Labs Reviewed  CBC WITH DIFFERENTIAL/PLATELET - Abnormal; Notable for the following:       Result Value   WBC 2.3 (*)    RBC 3.68 (*)    Hemoglobin 11.6 (*)    HCT 33.3 (*)    RDW 15.8 (*)    Platelets 115 (*)    Neutro Abs 1.6 (*)    Lymphs Abs 0.6 (*)    All other components within normal limits  COMPREHENSIVE METABOLIC PANEL - Abnormal; Notable for the following:    Sodium 131 (*)    Potassium 3.4 (*)    Chloride 91 (*)    Glucose, Bld 451 (*)    Total Protein 8.6 (*)    AST 78 (*)    ALT 75 (*)    Alkaline Phosphatase 197 (*)    Total Bilirubin 1.4 (*)    All other components within normal limits  ETHANOL - Abnormal; Notable for the  following:    Alcohol, Ethyl (B) 212 (*)    All other components within normal limits  LIPASE, BLOOD - Abnormal; Notable for the following:    Lipase 73 (*)  All other components within normal limits  CBG MONITORING, ED - Abnormal; Notable for the following:    Glucose-Capillary 458 (*)    All other components within normal limits  CBG MONITORING, ED - Abnormal; Notable for the following:    Glucose-Capillary 135 (*)    All other components within normal limits  APTT  PROTIME-INR    EKG  EKG Interpretation None       Radiology No results found.  Procedures Procedures (including critical care time)  Medications Ordered in ED Medications  insulin aspart (novoLOG) injection 10 Units (10 Units Intravenous Given 12/09/15 0018)  sodium chloride 0.9 % bolus 1,000 mL (1,000 mLs Intravenous New Bag/Given 12/09/15 0023)     Initial Impression / Assessment and Plan / ED Course  I have reviewed the triage vital signs and the nursing notes.  Pertinent labs & imaging results that were available during my care of the patient were reviewed by me and considered in my medical decision making (see chart for details).  Clinical Course    50 y.o. female presents with chronic abdominal pain that has worsened. Admits to drinking but clearly minimizing with EtOH >200. I believe this is contributing to her abdominal pain today. She is also markedly hyperglycemic and I suspect she has been inadvertently injecting insulin IM which rendered it less effective. Discussed proper injection technique. Corrected with IV insulin and fluid with resolving discomfort over the course of the visit. She has multiple stable chronic lab anbormalities relating to her cirrhosis but continues to drink despite being advised repetitively on cessation. Pt ambulated without difficulty during her ED visit, no fever or persistent intractable pain to suggest SBP. Plan to follow up with PCP as needed and return precautions  discussed for worsening or new concerning symptoms.   Final Clinical Impressions(s) / ED Diagnoses   Final diagnoses:  Hyperglycemia without ketosis  Alcohol intake above recommended sensible limits (HCC)  Abdominal pain, unspecified abdominal location    New Prescriptions New Prescriptions   No medications on file   I personally performed the services described in this documentation, which was scribed in my presence. The recorded information has been reviewed and is accurate.       Leo Grosser, MD 12/09/15 2035

## 2015-12-08 NOTE — ED Notes (Signed)
Bed: WA01 Expected date:  Expected time:  Means of arrival:  Comments: 50yo F hyperglycemia

## 2015-12-08 NOTE — ED Triage Notes (Signed)
Pt transported from home by EMS, 911 called to home for laceration to toe. Pt seemed off to EMS, BG found to be 576. Pt seen here for same recently. A & O at this time. Pt reports to EMS she is compliant to EMS

## 2015-12-09 ENCOUNTER — Encounter (HOSPITAL_COMMUNITY): Payer: Self-pay

## 2015-12-09 LAB — COMPREHENSIVE METABOLIC PANEL
ALT: 75 U/L — ABNORMAL HIGH (ref 14–54)
ANION GAP: 10 (ref 5–15)
AST: 78 U/L — ABNORMAL HIGH (ref 15–41)
Albumin: 3.6 g/dL (ref 3.5–5.0)
Alkaline Phosphatase: 197 U/L — ABNORMAL HIGH (ref 38–126)
BILIRUBIN TOTAL: 1.4 mg/dL — AB (ref 0.3–1.2)
BUN: 10 mg/dL (ref 6–20)
CALCIUM: 9.2 mg/dL (ref 8.9–10.3)
CO2: 30 mmol/L (ref 22–32)
Chloride: 91 mmol/L — ABNORMAL LOW (ref 101–111)
Creatinine, Ser: 0.63 mg/dL (ref 0.44–1.00)
Glucose, Bld: 451 mg/dL — ABNORMAL HIGH (ref 65–99)
POTASSIUM: 3.4 mmol/L — AB (ref 3.5–5.1)
Sodium: 131 mmol/L — ABNORMAL LOW (ref 135–145)
TOTAL PROTEIN: 8.6 g/dL — AB (ref 6.5–8.1)

## 2015-12-09 LAB — CBC WITH DIFFERENTIAL/PLATELET
BASOS ABS: 0 10*3/uL (ref 0.0–0.1)
BASOS PCT: 1 %
EOS PCT: 2 %
Eosinophils Absolute: 0 10*3/uL (ref 0.0–0.7)
HEMATOCRIT: 33.3 % — AB (ref 36.0–46.0)
Hemoglobin: 11.6 g/dL — ABNORMAL LOW (ref 12.0–15.0)
Lymphocytes Relative: 25 %
Lymphs Abs: 0.6 10*3/uL — ABNORMAL LOW (ref 0.7–4.0)
MCH: 31.5 pg (ref 26.0–34.0)
MCHC: 34.8 g/dL (ref 30.0–36.0)
MCV: 90.5 fL (ref 78.0–100.0)
MONO ABS: 0.1 10*3/uL (ref 0.1–1.0)
Monocytes Relative: 4 %
NEUTROS ABS: 1.6 10*3/uL — AB (ref 1.7–7.7)
Neutrophils Relative %: 69 %
Platelets: 115 10*3/uL — ABNORMAL LOW (ref 150–400)
RBC: 3.68 MIL/uL — AB (ref 3.87–5.11)
RDW: 15.8 % — AB (ref 11.5–15.5)
WBC: 2.3 10*3/uL — AB (ref 4.0–10.5)

## 2015-12-09 LAB — APTT: APTT: 30 s (ref 24–36)

## 2015-12-09 LAB — PROTIME-INR
INR: 1.18
Prothrombin Time: 15 seconds (ref 11.4–15.2)

## 2015-12-09 LAB — CBG MONITORING, ED: Glucose-Capillary: 135 mg/dL — ABNORMAL HIGH (ref 65–99)

## 2015-12-09 LAB — ETHANOL: ALCOHOL ETHYL (B): 212 mg/dL — AB (ref <5)

## 2015-12-09 LAB — LIPASE, BLOOD: Lipase: 73 U/L — ABNORMAL HIGH (ref 11–51)

## 2015-12-09 NOTE — ED Notes (Signed)
Patient d/c'd self care.  F/U reviewed.  Patient verbalized understanding. 

## 2015-12-13 ENCOUNTER — Encounter: Payer: Self-pay | Admitting: Internal Medicine

## 2015-12-13 ENCOUNTER — Other Ambulatory Visit (INDEPENDENT_AMBULATORY_CARE_PROVIDER_SITE_OTHER): Payer: Medicaid Other

## 2015-12-13 ENCOUNTER — Ambulatory Visit (INDEPENDENT_AMBULATORY_CARE_PROVIDER_SITE_OTHER): Payer: Medicaid Other | Admitting: Internal Medicine

## 2015-12-13 VITALS — BP 100/50 | HR 96 | Ht 62.25 in | Wt 101.1 lb

## 2015-12-13 DIAGNOSIS — B182 Chronic viral hepatitis C: Secondary | ICD-10-CM

## 2015-12-13 DIAGNOSIS — K7031 Alcoholic cirrhosis of liver with ascites: Secondary | ICD-10-CM

## 2015-12-13 DIAGNOSIS — Z794 Long term (current) use of insulin: Secondary | ICD-10-CM | POA: Diagnosis not present

## 2015-12-13 DIAGNOSIS — E118 Type 2 diabetes mellitus with unspecified complications: Secondary | ICD-10-CM | POA: Diagnosis not present

## 2015-12-13 LAB — COMPREHENSIVE METABOLIC PANEL
ALBUMIN: 3.7 g/dL (ref 3.5–5.2)
ALT: 66 U/L — ABNORMAL HIGH (ref 0–35)
AST: 65 U/L — AB (ref 0–37)
Alkaline Phosphatase: 210 U/L — ABNORMAL HIGH (ref 39–117)
BILIRUBIN TOTAL: 1.4 mg/dL — AB (ref 0.2–1.2)
BUN: 16 mg/dL (ref 6–23)
CALCIUM: 9.9 mg/dL (ref 8.4–10.5)
CHLORIDE: 93 meq/L — AB (ref 96–112)
CO2: 33 meq/L — AB (ref 19–32)
CREATININE: 0.65 mg/dL (ref 0.40–1.20)
GFR: 102.39 mL/min (ref >60.00)
Glucose, Bld: 376 mg/dL — ABNORMAL HIGH (ref 70–99)
POTASSIUM: 4.4 meq/L (ref 3.5–5.1)
SODIUM: 130 meq/L — AB (ref 135–145)
Total Protein: 8.7 g/dL — ABNORMAL HIGH (ref 6.0–8.3)

## 2015-12-13 NOTE — Patient Instructions (Addendum)
  Please go to the basement for labs and I will call with results and plans. Stay off alcohol as we discussed.   Please come back and see [place] Nov. 28th at 4:00pm.  Gatha Mayer, MD, [facility]

## 2015-12-13 NOTE — Assessment & Plan Note (Signed)
Stressed abstinence Recheck lytes and lft's today See me 2-3 months

## 2015-12-13 NOTE — Progress Notes (Signed)
   Subjective:    Patient ID: [name], female    DOB: Sep 06, 1965, 50 y.o.   MRN: [id number] Cc: ascites, cirrhosis HPI  Here for f/u - still drinking. Has been admitted 2x since I last saw her Spring 2017. Still has hyperglycemia and had SBP and non-ketotic hyperosmolar come - last dc 8/8. EtOH level 212 last admit  Medications, allergies, past medical history, past surgical history, family history and social history are reviewed and updated in the EMR. .  Review of Systems As above    Objective:   Physical Exam BP (!) 100/50 (BP Location: Left Arm, Patient Position: Sitting, Cuff Size: Normal)   Pulse 96   Ht 5' 2.25" (1.581 m)   Wt 101 lb 2 oz (45.9 kg)   LMP 11/01/2011   BMI 18.35 kg/m  Anicteric Thin Lungs cta Cor s1s2 no rmg abd mod ascites Ext no edema Alert and oriented x 3 and no asterixis  Assessment & Plan:   Encounter Diagnoses  Name Primary?  . Alcoholic cirrhosis of liver with ascites ([place])   . Chronic hepatitis C without hepatic coma ([place])   . Type 2 diabetes mellitus with complication, with long-term current use of insulin (HCC) Yes   CMET, ETOH level, Hep A and B immunity screens and HCV genotype and RNA level today. Consider HCV treatment but EtOH issues and bad DM control may be contraindications Return in Nov Stay off EtOH - discussed She is trying to get PCP so she can get better Tx of DM

## 2015-12-13 NOTE — Assessment & Plan Note (Signed)
Re assess and also hep B recheck

## 2015-12-14 LAB — HEPATITIS C RNA QUANTITATIVE
HCV QUANT LOG: 6.75 {Log} — AB (ref <1.18)
HCV QUANT: 5622529 [IU]/mL — AB (ref <15)

## 2015-12-15 LAB — HEPATITIS B CORE ANTIBODY, TOTAL: HEP B C TOTAL AB: NONREACTIVE

## 2015-12-15 LAB — HEPATITIS B SURFACE ANTIGEN: HEP B S AG: NEGATIVE

## 2015-12-15 LAB — HEPATITIS A ANTIBODY, TOTAL: HEP A TOTAL AB: REACTIVE — AB

## 2015-12-16 LAB — HEPATITIS C GENOTYPE

## 2015-12-16 LAB — ETHANOL, CLINICAL
Alcohol, Ethyl (B): NOT DETECTED g/dL(%)
Alcohol, Ethyl (B): NOT DETECTED mg/dL

## 2015-12-19 NOTE — Progress Notes (Signed)
Main problem remains hyperglycemia. Hep C still present.  Needs to get a PCP to treat DM Stay off EtOH Needs hep B surface antibody drawn to check immunity - add if possible though doubt Once we know that would need HAV vaccine and if negative Hep B S Ab Hep B vaccine Supposed to see me Nov

## 2015-12-20 ENCOUNTER — Other Ambulatory Visit: Payer: Self-pay

## 2015-12-20 ENCOUNTER — Other Ambulatory Visit: Payer: Self-pay | Admitting: Internal Medicine

## 2015-12-20 DIAGNOSIS — B1920 Unspecified viral hepatitis C without hepatic coma: Secondary | ICD-10-CM

## 2015-12-20 NOTE — Telephone Encounter (Signed)
Please advise 

## 2015-12-21 NOTE — Telephone Encounter (Signed)
OK to refill both x 3 I have seen her so ok  You can removed the overdue for yearly physical note in the Rx  She really needs to get a PCP and was working on that - find out where she is going - I recommend she try the internal or family medicine clinics at [facility] if they are on her list

## 2015-12-21 NOTE — Telephone Encounter (Signed)
I've spoken to [place] about getting a PCP and she has left several messages at [facility].  I also called over there and left a message for them to call her.  I reached them by calling [phone] and getting transferred.

## 2015-12-30 ENCOUNTER — Inpatient Hospital Stay (HOSPITAL_COMMUNITY)
Admission: EM | Admit: 2015-12-30 | Discharge: 2015-12-30 | DRG: 433 | Disposition: A | Discharge status: Home / Self Care | Payer: Medicaid Other | Attending: Internal Medicine | Admitting: Internal Medicine

## 2015-12-30 ENCOUNTER — Inpatient Hospital Stay (HOSPITAL_COMMUNITY): Payer: Medicaid Other

## 2015-12-30 ENCOUNTER — Encounter (HOSPITAL_COMMUNITY): Payer: Self-pay | Admitting: Emergency Medicine

## 2015-12-30 DIAGNOSIS — E876 Hypokalemia: Secondary | ICD-10-CM | POA: Diagnosis present

## 2015-12-30 DIAGNOSIS — R188 Other ascites: Secondary | ICD-10-CM

## 2015-12-30 DIAGNOSIS — Z765 Malingerer [conscious simulation]: Secondary | ICD-10-CM

## 2015-12-30 DIAGNOSIS — F329 Major depressive disorder, single episode, unspecified: Secondary | ICD-10-CM | POA: Diagnosis present

## 2015-12-30 DIAGNOSIS — Y906 Blood alcohol level of 120-199 mg/100 ml: Secondary | ICD-10-CM | POA: Diagnosis present

## 2015-12-30 DIAGNOSIS — R739 Hyperglycemia, unspecified: Secondary | ICD-10-CM

## 2015-12-30 DIAGNOSIS — K219 Gastro-esophageal reflux disease without esophagitis: Secondary | ICD-10-CM | POA: Diagnosis present

## 2015-12-30 DIAGNOSIS — Z79899 Other long term (current) drug therapy: Secondary | ICD-10-CM

## 2015-12-30 DIAGNOSIS — K2920 Alcoholic gastritis without bleeding: Secondary | ICD-10-CM | POA: Diagnosis present

## 2015-12-30 DIAGNOSIS — R109 Unspecified abdominal pain: Secondary | ICD-10-CM

## 2015-12-30 DIAGNOSIS — F419 Anxiety disorder, unspecified: Secondary | ICD-10-CM | POA: Diagnosis present

## 2015-12-30 DIAGNOSIS — Z888 Allergy status to other drugs, medicaments and biological substances status: Secondary | ICD-10-CM | POA: Diagnosis not present

## 2015-12-30 DIAGNOSIS — K7031 Alcoholic cirrhosis of liver with ascites: Principal | ICD-10-CM | POA: Diagnosis present

## 2015-12-30 DIAGNOSIS — K766 Portal hypertension: Secondary | ICD-10-CM | POA: Diagnosis present

## 2015-12-30 DIAGNOSIS — F1020 Alcohol dependence, uncomplicated: Secondary | ICD-10-CM | POA: Diagnosis present

## 2015-12-30 DIAGNOSIS — IMO0002 Reserved for concepts with insufficient information to code with codable children: Secondary | ICD-10-CM | POA: Diagnosis present

## 2015-12-30 DIAGNOSIS — R1013 Epigastric pain: Secondary | ICD-10-CM

## 2015-12-30 DIAGNOSIS — Z833 Family history of diabetes mellitus: Secondary | ICD-10-CM

## 2015-12-30 DIAGNOSIS — Z8041 Family history of malignant neoplasm of ovary: Secondary | ICD-10-CM | POA: Diagnosis not present

## 2015-12-30 DIAGNOSIS — E871 Hypo-osmolality and hyponatremia: Secondary | ICD-10-CM | POA: Diagnosis present

## 2015-12-30 DIAGNOSIS — E1165 Type 2 diabetes mellitus with hyperglycemia: Secondary | ICD-10-CM | POA: Diagnosis present

## 2015-12-30 DIAGNOSIS — B1920 Unspecified viral hepatitis C without hepatic coma: Secondary | ICD-10-CM | POA: Diagnosis present

## 2015-12-30 DIAGNOSIS — E0865 Diabetes mellitus due to underlying condition with hyperglycemia: Secondary | ICD-10-CM

## 2015-12-30 DIAGNOSIS — Z794 Long term (current) use of insulin: Secondary | ICD-10-CM

## 2015-12-30 LAB — URINE MICROSCOPIC-ADD ON: RBC / HPF: NONE SEEN RBC/hpf (ref 0–5)

## 2015-12-30 LAB — CBC
HEMATOCRIT: 31.2 % — AB (ref 36.0–46.0)
Hemoglobin: 10.3 g/dL — ABNORMAL LOW (ref 12.0–15.0)
MCH: 30.6 pg (ref 26.0–34.0)
MCHC: 33 g/dL (ref 30.0–36.0)
MCV: 92.6 fL (ref 78.0–100.0)
PLATELETS: 77 10*3/uL — AB (ref 150–400)
RBC: 3.37 MIL/uL — ABNORMAL LOW (ref 3.87–5.11)
RDW: 15.6 % — AB (ref 11.5–15.5)
WBC: 3.7 10*3/uL — ABNORMAL LOW (ref 4.0–10.5)

## 2015-12-30 LAB — URINALYSIS, ROUTINE W REFLEX MICROSCOPIC
BILIRUBIN URINE: NEGATIVE
HGB URINE DIPSTICK: NEGATIVE
KETONES UR: NEGATIVE mg/dL
Leukocytes, UA: NEGATIVE
NITRITE: NEGATIVE
PH: 6.5 (ref 5.0–8.0)
Protein, ur: NEGATIVE mg/dL
SPECIFIC GRAVITY, URINE: 1.035 — AB (ref 1.005–1.030)

## 2015-12-30 LAB — BASIC METABOLIC PANEL
Anion gap: 15 (ref 5–15)
Anion gap: 9 (ref 5–15)
BUN: 7 mg/dL (ref 6–20)
BUN: 6 mg/dL (ref 6–20)
CALCIUM: 9.5 mg/dL (ref 8.9–10.3)
CHLORIDE: 93 mmol/L — AB (ref 101–111)
CO2: 24 mmol/L (ref 22–32)
CO2: 28 mmol/L (ref 22–32)
CREATININE: 0.54 mg/dL (ref 0.44–1.00)
CREATININE: 0.51 mg/dL (ref 0.44–1.00)
Calcium: 8.8 mg/dL — ABNORMAL LOW (ref 8.9–10.3)
Chloride: 91 mmol/L — ABNORMAL LOW (ref 101–111)
GFR calc Af Amer: >60 mL/min (ref >60)
GFR calc Af Amer: >60 mL/min (ref >60)
GFR calc non Af Amer: >60 mL/min (ref >60)
GLUCOSE: 512 mg/dL — AB (ref 65–99)
GLUCOSE: 372 mg/dL — AB (ref 65–99)
POTASSIUM: 3.3 mmol/L — AB (ref 3.5–5.1)
Potassium: 3.2 mmol/L — ABNORMAL LOW (ref 3.5–5.1)
SODIUM: 130 mmol/L — AB (ref 135–145)
SODIUM: 130 mmol/L — AB (ref 135–145)

## 2015-12-30 LAB — NA AND K (SODIUM & POTASSIUM), RAND UR: Potassium Urine: 7 mmol/L

## 2015-12-30 LAB — HEPATIC FUNCTION PANEL
ALK PHOS: 179 U/L — AB (ref 38–126)
ALT: 72 U/L — AB (ref 14–54)
AST: 82 U/L — ABNORMAL HIGH (ref 15–41)
Albumin: 3.3 g/dL — ABNORMAL LOW (ref 3.5–5.0)
BILIRUBIN DIRECT: 0.8 mg/dL — AB (ref 0.1–0.5)
BILIRUBIN INDIRECT: 0.9 mg/dL (ref 0.3–0.9)
BILIRUBIN TOTAL: 1.7 mg/dL — AB (ref 0.3–1.2)
TOTAL PROTEIN: 8.3 g/dL — AB (ref 6.5–8.1)

## 2015-12-30 LAB — CBC WITH DIFFERENTIAL/PLATELET
BASOS ABS: 0 10*3/uL (ref 0.0–0.1)
BASOS PCT: 0 %
Eosinophils Absolute: 0 10*3/uL (ref 0.0–0.7)
Eosinophils Relative: 1 %
HCT: 33.3 % — ABNORMAL LOW (ref 36.0–46.0)
HEMOGLOBIN: 11.2 g/dL — AB (ref 12.0–15.0)
Lymphocytes Relative: 13 %
Lymphs Abs: 0.6 10*3/uL — ABNORMAL LOW (ref 0.7–4.0)
MCH: 30.9 pg (ref 26.0–34.0)
MCHC: 33.6 g/dL (ref 30.0–36.0)
MCV: 91.7 fL (ref 78.0–100.0)
MONO ABS: 0.5 10*3/uL (ref 0.1–1.0)
Monocytes Relative: 11 %
NEUTROS ABS: 3.4 10*3/uL (ref 1.7–7.7)
NEUTROS PCT: 75 %
Platelets: 250 10*3/uL (ref 150–400)
RBC: 3.63 MIL/uL — ABNORMAL LOW (ref 3.87–5.11)
RDW: 15.5 % (ref 11.5–15.5)
WBC: 4.5 10*3/uL (ref 4.0–10.5)

## 2015-12-30 LAB — RAPID URINE DRUG SCREEN, HOSP PERFORMED
AMPHETAMINES: NOT DETECTED
BARBITURATES: NOT DETECTED
Benzodiazepines: NOT DETECTED
Cocaine: NOT DETECTED
OPIATES: NOT DETECTED
TETRAHYDROCANNABINOL: NOT DETECTED

## 2015-12-30 LAB — CBG MONITORING, ED: Glucose-Capillary: 442 mg/dL — ABNORMAL HIGH (ref 65–99)

## 2015-12-30 LAB — PROTIME-INR
INR: 1.18
PROTHROMBIN TIME: 15 s (ref 11.4–15.2)

## 2015-12-30 LAB — OSMOLALITY: OSMOLALITY: 335 mosm/kg — AB (ref 275–295)

## 2015-12-30 LAB — LIPASE, BLOOD: Lipase: 65 U/L — ABNORMAL HIGH (ref 11–51)

## 2015-12-30 LAB — BETA-HYDROXYBUTYRIC ACID: BETA-HYDROXYBUTYRIC ACID: 0.29 mmol/L — AB (ref 0.05–0.27)

## 2015-12-30 LAB — GLUCOSE, CAPILLARY
GLUCOSE-CAPILLARY: 321 mg/dL — AB (ref 65–99)
GLUCOSE-CAPILLARY: 298 mg/dL — AB (ref 65–99)
Glucose-Capillary: 343 mg/dL — ABNORMAL HIGH (ref 65–99)
Glucose-Capillary: 198 mg/dL — ABNORMAL HIGH (ref 65–99)

## 2015-12-30 LAB — AMMONIA: Ammonia: 60 umol/L — ABNORMAL HIGH (ref 9–35)

## 2015-12-30 LAB — ETHANOL: Alcohol, Ethyl (B): 153 mg/dL — ABNORMAL HIGH (ref <5)

## 2015-12-30 LAB — OSMOLALITY, URINE: OSMOLALITY UR: 596 mosm/kg (ref 300–900)

## 2015-12-30 MED ORDER — ACETAMINOPHEN 650 MG RE SUPP
650.0000 mg | Freq: Four times a day (QID) | RECTAL | Status: DC | PRN
Start: 2015-12-30 — End: 2015-12-30

## 2015-12-30 MED ORDER — IOPAMIDOL (ISOVUE-300) INJECTION 61%
100.0000 mL | Freq: Once | INTRAVENOUS | Status: AC | PRN
  Administered 2015-12-30: 100 mL via INTRAVENOUS

## 2015-12-30 MED ORDER — LACTULOSE 10 GM/15ML PO SOLN: 20.0000 g | Freq: Three times a day (TID) | ORAL | Status: DC | PRN

## 2015-12-30 MED ORDER — THIAMINE HCL 100 MG/ML IJ SOLN: 100.0000 mg | Freq: Every day | INTRAMUSCULAR | Status: DC

## 2015-12-30 MED ORDER — POTASSIUM CHLORIDE CRYS ER 10 MEQ PO TBCR
10.0000 meq | EXTENDED_RELEASE_TABLET | Freq: Every day | ORAL | Status: DC
  Administered 2015-12-30: 10 meq via ORAL
  Filled 2015-12-30: qty 1

## 2015-12-30 MED ORDER — POTASSIUM CHLORIDE CRYS ER 20 MEQ PO TBCR: 40.0000 meq | EXTENDED_RELEASE_TABLET | Freq: Once | ORAL | Status: DC

## 2015-12-30 MED ORDER — LORAZEPAM 2 MG/ML IJ SOLN: 1.0000 mg | Freq: Four times a day (QID) | INTRAMUSCULAR | Status: DC | PRN

## 2015-12-30 MED ORDER — VITAMIN B-1 100 MG PO TABS
100.0000 mg | ORAL_TABLET | Freq: Every day | ORAL | Status: DC
  Administered 2015-12-30: 100 mg via ORAL
  Filled 2015-12-30: qty 1

## 2015-12-30 MED ORDER — DEXTROSE-NACL 5-0.45 % IV SOLN: INTRAVENOUS | Status: DC

## 2015-12-30 MED ORDER — SPIRONOLACTONE 25 MG PO TABS
200.0000 mg | ORAL_TABLET | Freq: Every day | ORAL | Status: DC
  Administered 2015-12-30: 200 mg via ORAL
  Filled 2015-12-30: qty 8

## 2015-12-30 MED ORDER — INSULIN ASPART 100 UNIT/ML FLEXPEN: PEN_INJECTOR | SUBCUTANEOUS | 2 refills | Status: AC

## 2015-12-30 MED ORDER — ONDANSETRON HCL 4 MG/2ML IJ SOLN: 4.0000 mg | Freq: Four times a day (QID) | INTRAMUSCULAR | Status: DC | PRN

## 2015-12-30 MED ORDER — DIATRIZOATE MEGLUMINE & SODIUM 66-10 % PO SOLN
30.0000 mL | Freq: Once | ORAL | Status: DC
  Filled 2015-12-30: qty 30

## 2015-12-30 MED ORDER — FOLIC ACID 1 MG PO TABS
1.0000 mg | ORAL_TABLET | Freq: Every day | ORAL | Status: DC
  Administered 2015-12-30: 1 mg via ORAL
  Filled 2015-12-30: qty 1

## 2015-12-30 MED ORDER — INSULIN GLARGINE 100 UNIT/ML ~~LOC~~ SOLN
55.0000 [IU] | Freq: Every day | SUBCUTANEOUS | Status: DC
  Administered 2015-12-30: 55 [IU] via SUBCUTANEOUS
  Filled 2015-12-30: qty 0.55

## 2015-12-30 MED ORDER — HYDROMORPHONE HCL 1 MG/ML IJ SOLN
1.0000 mg | Freq: Once | INTRAMUSCULAR | Status: AC
  Administered 2015-12-30: 1 mg via INTRAVENOUS
  Filled 2015-12-30: qty 1

## 2015-12-30 MED ORDER — INSULIN GLARGINE 100 UNITS/ML SOLOSTAR PEN: 55.0000 [IU] | PEN_INJECTOR | Freq: Every day | SUBCUTANEOUS | 11 refills | Status: AC

## 2015-12-30 MED ORDER — ENOXAPARIN SODIUM 30 MG/0.3ML ~~LOC~~ SOLN
30.0000 mg | SUBCUTANEOUS | Status: DC
  Administered 2015-12-30: 30 mg via SUBCUTANEOUS
  Filled 2015-12-30: qty 0.3

## 2015-12-30 MED ORDER — INSULIN ASPART 100 UNIT/ML ~~LOC~~ SOLN
0.0000 [IU] | Freq: Three times a day (TID) | SUBCUTANEOUS | Status: DC
Start: 2015-12-30 — End: 2015-12-30
  Administered 2015-12-30: 8 [IU] via SUBCUTANEOUS

## 2015-12-30 MED ORDER — INFLUENZA VAC SPLIT QUAD 0.5 ML IM SUSY
0.5000 mL | PREFILLED_SYRINGE | INTRAMUSCULAR | Status: DC
Start: 2015-12-31 — End: 2015-12-30

## 2015-12-30 MED ORDER — SODIUM CHLORIDE 0.9 % IV SOLN
INTRAVENOUS | Status: DC
  Administered 2015-12-30: 3.8 [IU]/h via INTRAVENOUS
  Filled 2015-12-30: qty 2.5

## 2015-12-30 MED ORDER — INSULIN GLARGINE 100 UNIT/ML ~~LOC~~ SOLN: 40.0000 [IU] | Freq: Every day | SUBCUTANEOUS | Status: DC

## 2015-12-30 MED ORDER — PANTOPRAZOLE SODIUM 40 MG PO TBEC
40.0000 mg | DELAYED_RELEASE_TABLET | Freq: Two times a day (BID) | ORAL | Status: DC
Start: 2015-12-30 — End: 2015-12-30
  Administered 2015-12-30: 40 mg via ORAL
  Filled 2015-12-30: qty 1

## 2015-12-30 MED ORDER — ONDANSETRON HCL 4 MG PO TABS: 4.0000 mg | ORAL_TABLET | Freq: Four times a day (QID) | ORAL | Status: DC | PRN

## 2015-12-30 MED ORDER — ACETAMINOPHEN 325 MG PO TABS
650.0000 mg | ORAL_TABLET | Freq: Four times a day (QID) | ORAL | Status: DC | PRN
  Administered 2015-12-30: 650 mg via ORAL
  Filled 2015-12-30: qty 2

## 2015-12-30 MED ORDER — DOCUSATE SODIUM 100 MG PO CAPS
100.0000 mg | ORAL_CAPSULE | Freq: Two times a day (BID) | ORAL | Status: DC
  Administered 2015-12-30: 100 mg via ORAL
  Filled 2015-12-30: qty 1

## 2015-12-30 MED ORDER — INSULIN ASPART 100 UNIT/ML ~~LOC~~ SOLN
4.0000 [IU] | Freq: Three times a day (TID) | SUBCUTANEOUS | Status: DC
  Administered 2015-12-30: 4 [IU] via SUBCUTANEOUS

## 2015-12-30 MED ORDER — SERTRALINE HCL 50 MG PO TABS
150.0000 mg | ORAL_TABLET | Freq: Every day | ORAL | Status: DC
  Administered 2015-12-30: 150 mg via ORAL
  Filled 2015-12-30: qty 3

## 2015-12-30 MED ORDER — TRAMADOL HCL 50 MG PO TABS: 50.0000 mg | ORAL_TABLET | Freq: Four times a day (QID) | ORAL | 0 refills | Status: DC | PRN

## 2015-12-30 MED ORDER — ADULT MULTIVITAMIN W/MINERALS CH
1.0000 | ORAL_TABLET | Freq: Every day | ORAL | Status: DC
  Administered 2015-12-30: 1 via ORAL
  Filled 2015-12-30: qty 1

## 2015-12-30 MED ORDER — LORAZEPAM 1 MG PO TABS: 1.0000 mg | ORAL_TABLET | Freq: Four times a day (QID) | ORAL | Status: DC | PRN

## 2015-12-30 MED ORDER — POTASSIUM CHLORIDE CRYS ER 20 MEQ PO TBCR
40.0000 meq | EXTENDED_RELEASE_TABLET | Freq: Once | ORAL | Status: AC
  Administered 2015-12-30: 40 meq via ORAL
  Filled 2015-12-30: qty 2

## 2015-12-30 MED ORDER — IOPAMIDOL (ISOVUE-300) INJECTION 61%
30.0000 mL | Freq: Once | INTRAVENOUS | Status: AC | PRN
  Administered 2015-12-30: 30 mL via ORAL

## 2015-12-30 MED ORDER — FUROSEMIDE 20 MG PO TABS
80.0000 mg | ORAL_TABLET | Freq: Every day | ORAL | Status: DC
  Administered 2015-12-30: 80 mg via ORAL
  Filled 2015-12-30: qty 4

## 2015-12-30 NOTE — Progress Notes (Signed)
Patient ID: [name], female   DOB: [birth year], 50 y.o.   MRN: [id number]   US guided paracentesis requested per MD  Limited US abdomen reveals NO ascites today No procedure performed  Back to room

## 2015-12-30 NOTE — ED Notes (Signed)
Per EMS- Pt c/o abdominal pain, distension. Admits to drinking ETOH today. Cbg of 464.

## 2015-12-30 NOTE — ED Provider Notes (Signed)
[facility] DEPT Provider Note   CSN: [id number] Arrival date & time: 12/30/15  0123 By signing my name below, I, Georgette Shell, attest that this documentation has been prepared under the direction and in the presence of Orpah Greek, MD. Electronically Signed: Georgette Shell, ED Scribe. 12/30/15. 2:00 AM.  History   Chief Complaint Chief Complaint  Patient presents with  . Abdominal Pain   HPI Comments: [name] is a 50 y.o. female with h/o cirrhosis who presents to the Emergency Department by EMS complaining of gradual onset, constant abdominal pain with distension onset one week ago, worsening tonight. Pain is exacerbated with palpation. Pt saw her PCP last week for the same symptoms and states she has fluid in her abdomen. She has been compliant with her fluid medication. Pt has h/o similar symptoms and notes she has had abdominal paracentesis performed numerous times. Pt also complains of a rash on her bilateral ankles. Pt states the rash does not itch or hurt. Pt denies fever.   The history is provided by the patient. No language interpreter was used.    Past Medical History:  Diagnosis Date  . Abnormal vaginal bleeding    uterine fibroid  . Alcohol dependency ([place])    at least [facility] admissions 2007- 09/2013 for detox.   Marland Kitchen Anxiety   . Chronic abdominal pain 2011  . Chronic constipation   . Cirrhosis ([place]) 11/09/2010  . Drug dependency ([place])    Hx of cocaine use  . Gallstone 2006  . GERD (gastroesophageal reflux disease)   . Hemochromatosis    last phlebotomy tx ~ 2012. liver bx 2006  . Hepatitis C approx dx 2005   no tx to date  . Major depression ([place]) 11/2012, 09/2013   [facility] admissions for this, suicide attempt by OD Ambien (2014), Prozac (2015),ETOHism   . Pancreatitis    HX of  . Pancytopenia   . Rheumatoid factor positive   . Type II or unspecified type diabetes mellitus without mention of complication, uncontrolled 09/17/2011    Patient Active Problem List   Diagnosis Date Noted  . Insulin-requiring or dependent type II diabetes mellitus ([place]) 11/26/2015  . Hyperosmolar non-ketotic state in patient with type 2 diabetes mellitus ([place]) 11/26/2015  . SBP (spontaneous bacterial peritonitis) ([place]) 10/22/2015  . Pancytopenia ([place]) 10/22/2015  . Diabetes mellitus type II, uncontrolled ([place]) 10/22/2015  . Chronic hepatitis C without hepatic coma ([place]) 08/24/2015  . Hyponatremia   . Erosive gastritis   . Secondary esophageal varices without bleeding ([place])   . Intractable abdominal pain 08/07/2015  . Anxiety and depression 08/07/2015  . GERD (gastroesophageal reflux disease) 08/07/2015  . Anemia 08/07/2015  . Leukopenia 05/23/2015  . Alcoholic cirrhosis of liver with ascites ([place]) 03/29/2015  . Protein-calorie malnutrition, severe ([place]) 01/03/2015  . Hypokalemia 11/09/2014  . Gallstones   . Elevated LFTs 07/20/2014  . Severe recurrent major depression without psychotic features ([place]) 07/19/2014  . Wellness examination 07/14/2014  . Colon polyps 02/18/2014  . Rectal varices - small 02/18/2014  . Thrombocytopenia ([place]) 02/14/2014  . MDD (major depressive disorder) ([place]) 09/23/2013  . Alcohol dependence ([place]) 12/17/2012  . Cocaine abuse 12/17/2012    Past Surgical History:  Procedure Laterality Date  . ABDOMINAL HYSTERECTOMY  12/17/2011   Procedure: HYSTERECTOMY ABDOMINAL;  Surgeon: Gus Height, MD;  Location: [facility];  Service: Gynecology;  Laterality: N/A;  . APPENDECTOMY    . CESAREAN SECTION  x 2  . COLONOSCOPY N/A 02/18/2014   Procedure:  COLONOSCOPY;  Surgeon: Gatha Mayer, MD;  Location: [facility];  Service: Endoscopy;  Laterality: N/A;  . ESOPHAGOGASTRODUODENOSCOPY N/A 12/10/2013   Procedure: ESOPHAGOGASTRODUODENOSCOPY (EGD);  Surgeon: Ladene Artist, MD;  Location: [facility];  Service: Endoscopy;  Laterality: N/A;  . ESOPHAGOGASTRODUODENOSCOPY (EGD) WITH PROPOFOL N/A 08/08/2015   Procedure: ESOPHAGOGASTRODUODENOSCOPY (EGD) WITH  PROPOFOL;  Surgeon: Gatha Mayer, MD;  Location: [facility];  Service: Endoscopy;  Laterality: N/A;  . FOOT SURGERY  2013  . PERCUTANEOUS LIVER BIOPSY  2011  . SALPINGOOPHORECTOMY  12/17/2011   Procedure: SALPINGO OOPHERECTOMY;  Surgeon: Gus Height, MD;  Location: [facility];  Service: Gynecology;  Laterality: Bilateral;    OB History    Gravida Para Term Preterm AB Living   2 2 2     3    SAB TAB Ectopic Multiple Live Births                   Home Medications    Prior to Admission medications   Medication Sig Start Date End Date Taking? Authorizing Provider  calcium carbonate (TUMS EX) 750 MG chewable tablet Chew 3 tablets by mouth 4 (four) times daily as needed for heartburn.   Yes Historical Provider, MD  furosemide (LASIX) 40 MG tablet Take 2 tablets (80 mg total) by mouth daily. 11/28/15  Yes Kelvin Cellar, MD  insulin glargine (LANTUS) 100 unit/mL SOPN Inject 40 Units into the skin at bedtime.   Yes Historical Provider, MD  lactulose (CHRONULAC) 10 GM/15ML solution Take 20 g by mouth 3 (three) times daily as needed for mild constipation.   Yes Historical Provider, MD  metFORMIN (GLUCOPHAGE) 500 MG tablet Take 500 mg by mouth 2 (two) times daily with a meal.   Yes Historical Provider, MD  Multiple Vitamin (MULTIVITAMIN WITH MINERALS) TABS tablet Take 1 tablet by mouth daily.    Yes Historical Provider, MD  NOVOLOG FLEXPEN 100 UNIT/ML FlexPen INJECT 2 UNITS INTO SKIN 3 TIMES DAILY BEFORE MEALS Patient taking differently: INJECT 4 UNITS INTO SKIN 3 TIMES DAILY BEFORE MEALS 12/21/15  Yes Gatha Mayer, MD  pantoprazole (PROTONIX) 40 MG tablet Take 1 tablet (40 mg total) by mouth 2 (two) times daily. 08/10/15  Yes Barton Dubois, MD  potassium chloride (KLOR-CON M10) 10 MEQ tablet Take 1 tablet (10 mEq total) by mouth daily. 12/21/15  Yes Gatha Mayer, MD  sertraline (ZOLOFT) 100 MG tablet Take 150 mg by mouth daily.   Yes Historical Provider, MD  spironolactone (ALDACTONE) 100 MG tablet  Take 2 tablets (200 mg total) by mouth daily. 11/28/15  Yes Kelvin Cellar, MD    Family History Family History  Problem Relation Age of Onset  . Cancer Mother     ovarian  . Diabetes Father     Social History Social History  Substance Use Topics  . Smoking status: Never Smoker  . Smokeless tobacco: Never Used  . Alcohol use Yes     Comment: one pint vodka daily years     Allergies   Other; Betadine [povidone iodine]; and Chlorhexidine gluconate   Review of Systems Review of Systems  Constitutional: Negative for fever.  Gastrointestinal: Positive for abdominal pain.  All other systems reviewed and are negative.    Physical Exam Updated Vital Signs BP 121/88   Pulse 118   Temp 98.2 F (36.8 C) (Oral)   Resp 14   Ht 5' 2.25" (1.581 m)   Wt 101 lb (45.8 kg)   LMP 11/01/2011  SpO2 95%   BMI 18.33 kg/m   Physical Exam  Constitutional: She is oriented to person, place, and time. She appears well-developed and well-nourished. No distress.  HENT:  Head: Normocephalic and atraumatic.  Right Ear: Hearing normal.  Left Ear: Hearing normal.  Nose: Nose normal.  Mouth/Throat: Oropharynx is clear and moist and mucous membranes are normal.  Eyes: Conjunctivae and EOM are normal. Pupils are equal, round, and reactive to light.  Neck: Normal range of motion. Neck supple.  Cardiovascular: Regular rhythm, S1 normal and S2 normal.  Exam reveals no gallop and no friction rub.   No murmur heard. Pulmonary/Chest: Effort normal and breath sounds normal. No respiratory distress. She exhibits no tenderness.  Abdominal: Soft. Normal appearance and bowel sounds are normal. She exhibits distension. There is no hepatosplenomegaly. There is tenderness. There is no rebound, no guarding, no tenderness at McBurney's point and negative Murphy's sign. No hernia.  Diffuse tenderness  Musculoskeletal: Normal range of motion.  Neurological: She is alert and oriented to person, place, and  time. She has normal strength. No cranial nerve deficit or sensory deficit. Coordination normal. GCS eye subscore is 4. GCS verbal subscore is 5. GCS motor subscore is 6.  Skin: Skin is warm, dry and intact. No rash noted. No cyanosis.  Scattered pustules on feet.  Psychiatric: She has a normal mood and affect. Her speech is normal and behavior is normal. Thought content normal.  Nursing note and vitals reviewed.    ED Treatments / Results  DIAGNOSTIC STUDIES: Oxygen Saturation is 97% on RA, adequate by my interpretation.    COORDINATION OF CARE: 1:56 AM Discussed treatment plan with pt at bedside and pt agreed to plan.  Labs (all labs ordered are listed, but only abnormal results are displayed) Labs Reviewed  CBC WITH DIFFERENTIAL/PLATELET - Abnormal; Notable for the following:       Result Value   RBC 3.63 (*)    Hemoglobin 11.2 (*)    HCT 33.3 (*)    Lymphs Abs 0.6 (*)    All other components within normal limits  HEPATIC FUNCTION PANEL - Abnormal; Notable for the following:    Total Protein 8.3 (*)    Albumin 3.3 (*)    AST 82 (*)    ALT 72 (*)    Alkaline Phosphatase 179 (*)    Total Bilirubin 1.7 (*)    Bilirubin, Direct 0.8 (*)    All other components within normal limits  BASIC METABOLIC PANEL - Abnormal; Notable for the following:    Sodium 130 (*)    Potassium 3.2 (*)    Chloride 91 (*)    Glucose, Bld 512 (*)    All other components within normal limits  URINALYSIS, ROUTINE W REFLEX MICROSCOPIC (NOT AT [facility]) - Abnormal; Notable for the following:    Specific Gravity, Urine 1.035 (*)    Glucose, UA >1000 (*)    All other components within normal limits  ETHANOL - Abnormal; Notable for the following:    Alcohol, Ethyl (B) 153 (*)    All other components within normal limits  BETA-HYDROXYBUTYRIC ACID - Abnormal; Notable for the following:    Beta-Hydroxybutyric Acid 0.29 (*)    All other components within normal limits  LIPASE, BLOOD - Abnormal; Notable for  the following:    Lipase 65 (*)    All other components within normal limits  AMMONIA - Abnormal; Notable for the following:    Ammonia 60 (*)    All other  components within normal limits  URINE MICROSCOPIC-ADD ON - Abnormal; Notable for the following:    Squamous Epithelial / LPF 6-30 (*)    Bacteria, UA RARE (*)    All other components within normal limits  CBG MONITORING, ED - Abnormal; Notable for the following:    Glucose-Capillary 442 (*)    All other components within normal limits  PROTIME-INR  URINE RAPID DRUG SCREEN, HOSP PERFORMED  OSMOLALITY  OSMOLALITY, URINE  NA AND K (SODIUM & POTASSIUM), RAND UR    EKG  EKG Interpretation  Date/Time:  Saturday December 30 2015 02:16:17 EDT Ventricular Rate:  120 PR Interval:    QRS Duration: 86 QT Interval:  341 QTC Calculation: 482 R Axis:   88 Text Interpretation:  Sinus tachycardia Consider right atrial enlargement Nonspecific T abnormalities, inferior leads No significant change since last tracing Confirmed by Naphtali Zywicki  MD, Roxas Clymer [id number]) on 12/30/2015 2:20:45 AM       Radiology No results found.  Procedures Procedures (including critical care time)  Medications Ordered in ED Medications  dextrose 5 %-0.45 % sodium chloride infusion ( Intravenous Hold 12/30/15 0405)  insulin regular (NOVOLIN R,HUMULIN R) 250 Units in sodium chloride 0.9 % 250 mL (1 Units/mL) infusion (3.8 Units/hr Intravenous New Bag/Given 12/30/15 0532)  potassium chloride SA (K-DUR,KLOR-CON) CR tablet 40 mEq (40 mEq Oral Given 12/30/15 0532)  HYDROmorphone (DILAUDID) injection 1 mg (1 mg Intravenous Given 12/30/15 0532)     Initial Impression / Assessment and Plan / ED Course  I have reviewed the triage vital signs and the nursing notes.  Pertinent labs & imaging results that were available during my care of the patient were reviewed by me and considered in my medical decision making (see chart for details).  Clinical Course   Patient presents  to the emergency para for evaluation of abdominal pain. Patient reports that over the past week she has had gradual  Progression of distention of her abdomen and worsening of pain. She has not had associated fever. Patient does have a history of cirrhosis, reportsmultiple paracentesis procedures in the past.  Patient is diffusely tender but does not appear to be infected at this time. There is no fever. White count is normal. Patient is, however, hyperglycemic. He could not be treated with fluids because of her cirrhosis. She was placed on glucostabilizer insulin.   I did briefly put the ultrasound probe on her to see if there was a window to perform paracentesis. I was not comfortable that there was a large enough window for paracentesis at this time.  Final Clinical Impressions(s) / ED Diagnoses   Final diagnoses:  Ascites  Hyperglycemia    New Prescriptions Current Discharge Medication List    I personally performed the services described in this documentation, which was scribed in my presence. The recorded information has been reviewed and is accurate.     Orpah Greek, MD 12/30/15 [phone]

## 2015-12-30 NOTE — ED Notes (Signed)
Patient waiting on paracentesis by md.

## 2015-12-30 NOTE — Progress Notes (Signed)
Patient noted with continued IV insulin drip. Attending notified and verbalized to continue insulin drip for two hours then discontinue. Also instructed to give Lantus 55 units. Continue to monitor CBGs. Patient resting in bed with pain controlled. Will continue to monitor.

## 2015-12-30 NOTE — ED Notes (Addendum)
Abigail Butts from lab informed patient with glucose of 512, RN and MD notified.

## 2015-12-30 NOTE — H&P (Addendum)
History and Physical    Raya Armas [id number] DOB: [birth year] DOA: 12/30/2015  PCP: Has appt at [facility] on Oct 6 Consultants:  Carlean Purl - GI Patient coming from: home - NOK: Bronson Ing, [phone]  Chief Complaint: abdominal swelling  HPI: [name] is a 50 y.o. female with medical history significant of alcoholic cirrhosis and poorly controlled DM.  Patient reports that her stomach has been swelling.  Has cirrhosis and abdominal pain/distention.  Saw GI last week (due again on 9/28) and they said she needs blood work for Hep B.  Has had recurrent need for paracentesis.  Possible h/o SBP (she isn't sure).  Denies daily drinking, but does acknowledge drinking tonight.    Glucose is usually uncontrolled, "never under 200".  Previously 700, she thinks.   ED Course:  Routine labs; for paracentesis prior to transfer to floor  Review of Systems: As per HPI; otherwise 10 point review of systems reviewed and negative.   Ambulatory Status:  ambulates little during flare-ups  Past Medical History:  Diagnosis Date  . Abnormal vaginal bleeding    uterine fibroid  . Alcohol dependency ([place])    at least [facility] admissions 2007- 09/2013 for detox.   Marland Kitchen Anxiety   . Chronic abdominal pain 2011  . Chronic constipation   . Cirrhosis ([place]) 11/09/2010  . Drug dependency ([place])    Hx of cocaine use  . Gallstone 2006  . GERD (gastroesophageal reflux disease)   . Hemochromatosis    last phlebotomy tx ~ 2012. liver bx 2006  . Hepatitis C approx dx 2005   no tx to date  . Major depression ([place]) 11/2012, 09/2013   [facility] admissions for this, suicide attempt by OD Ambien (2014), Prozac (2015),ETOHism   . Pancreatitis    HX of  . Pancytopenia   . Rheumatoid factor positive   . Type II or unspecified type diabetes mellitus without mention of complication, uncontrolled 09/17/2011    Past Surgical History:  Procedure Laterality Date  . ABDOMINAL HYSTERECTOMY  12/17/2011   Procedure: HYSTERECTOMY  ABDOMINAL;  Surgeon: Gus Height, MD;  Location: [facility];  Service: Gynecology;  Laterality: N/A;  . APPENDECTOMY    . CESAREAN SECTION  x 2  . COLONOSCOPY N/A 02/18/2014   Procedure: COLONOSCOPY;  Surgeon: Gatha Mayer, MD;  Location: [facility];  Service: Endoscopy;  Laterality: N/A;  . ESOPHAGOGASTRODUODENOSCOPY N/A 12/10/2013   Procedure: ESOPHAGOGASTRODUODENOSCOPY (EGD);  Surgeon: Ladene Artist, MD;  Location: [facility];  Service: Endoscopy;  Laterality: N/A;  . ESOPHAGOGASTRODUODENOSCOPY (EGD) WITH PROPOFOL N/A 08/08/2015   Procedure: ESOPHAGOGASTRODUODENOSCOPY (EGD) WITH PROPOFOL;  Surgeon: Gatha Mayer, MD;  Location: [facility];  Service: Endoscopy;  Laterality: N/A;  . FOOT SURGERY  2013  . PERCUTANEOUS LIVER BIOPSY  2011  . SALPINGOOPHORECTOMY  12/17/2011   Procedure: SALPINGO OOPHERECTOMY;  Surgeon: Gus Height, MD;  Location: [facility];  Service: Gynecology;  Laterality: Bilateral;    Social History   Social History  . Marital status: Married    Spouse name: N/A  . Number of children: 3  . Years of education: N/A   Occupational History  . 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 Smoking status: Never Smoker  . Smokeless tobacco: Never Used  . Alcohol use Yes     Comment: one pint vodka daily years  . Drug use: No     Comment: denied all drugs during admission  .  Sexual activity: Not Currently    Birth control/ protection: None   Other Topics Concern  . Not on file   Social History Narrative  . No narrative on file    Allergies  Allergen Reactions  . Other Itching and Other (See Comments)    Pt states that she is allergic to CHG wipes.    . Betadine [Povidone Iodine] Hives  . Chlorhexidine Gluconate Itching    Chg wipes    Family History  Problem Relation Age of Onset  . Cancer Mother     ovarian  . Diabetes Father     Prior to Admission medications   Medication Sig Start Date End Date  Taking? Authorizing Provider  calcium carbonate (TUMS EX) 750 MG chewable tablet Chew 3 tablets by mouth 4 (four) times daily as needed for heartburn.   Yes Historical Provider, MD  furosemide (LASIX) 40 MG tablet Take 2 tablets (80 mg total) by mouth daily. 11/28/15  Yes Kelvin Cellar, MD  ibuprofen (ADVIL,MOTRIN) 200 MG tablet Take 800 mg by mouth every 6 (six) hours as needed (for pain).   Yes Historical Provider, MD  insulin glargine (LANTUS) 100 unit/mL SOPN Inject 40 Units into the skin at bedtime.   Yes Historical Provider, MD  lactulose (CHRONULAC) 10 GM/15ML solution Take 20 g by mouth 3 (three) times daily as needed for mild constipation.   Yes Historical Provider, MD  metFORMIN (GLUCOPHAGE) 500 MG tablet Take 500 mg by mouth 2 (two) times daily with a meal.   Yes Historical Provider, MD  Multiple Vitamin (MULTIVITAMIN WITH MINERALS) TABS tablet Take 1 tablet by mouth daily.    Yes Historical Provider, MD  NOVOLOG FLEXPEN 100 UNIT/ML FlexPen INJECT 2 UNITS INTO SKIN 3 TIMES DAILY BEFORE MEALS Patient taking differently: INJECT 4 UNITS INTO SKIN 3 TIMES DAILY BEFORE MEALS 12/21/15  Yes Gatha Mayer, MD  pantoprazole (PROTONIX) 40 MG tablet Take 1 tablet (40 mg total) by mouth 2 (two) times daily. 08/10/15  Yes Barton Dubois, MD  potassium chloride (KLOR-CON M10) 10 MEQ tablet Take 1 tablet (10 mEq total) by mouth daily. 12/21/15  Yes Gatha Mayer, MD  sertraline (ZOLOFT) 100 MG tablet Take 150 mg by mouth daily.   Yes Historical Provider, MD  spironolactone (ALDACTONE) 100 MG tablet Take 2 tablets (200 mg total) by mouth daily. 11/28/15  Yes Kelvin Cellar, MD  nystatin cream (MYCOSTATIN) Apply topically 3 (three) times daily. Apply to groin area and inner thighs Patient not taking: Reported on 10/21/2015 08/10/15   Barton Dubois, MD    Physical Exam: Vitals:   12/30/15 0200 12/30/15 0203 12/30/15 0230 12/30/15 0408  BP: 114/98 114/98 110/71 107/83  Pulse: (!) 121 (!) 122 (!) 121 118    Resp: 18 14 13 15   Temp:  98.2 F (36.8 C)  98.2 F (36.8 C)  TempSrc:  Oral  Oral  SpO2: 96% 97% 96% 98%  Weight:  45.8 kg (101 lb)    Height:  5' 2.25" (1.581 m)       General: Appears calm and comfortable and is NAD Eyes:  PERRL, EOMI, normal lids, iris ENT:  grossly normal hearing, lips & tongue, mmm Neck:  no LAD, masses or thyromegaly Cardiovascular:  RRR, no m/r/g. No LE edema.  Respiratory:  CTA bilaterally, no w/r/r. Normal respiratory effort. Abdomen: tense ascites Skin:  Scattered small pustular lesions on R?L lower extremities Musculoskeletal:  grossly normal tone BUE/BLE, good ROM, no bony abnormality Psychiatric:  grossly  normal mood and affect, speech fluent and appropriate, AOx3 Neurologic:  CN 2-12 grossly intact, moves all extremities in coordinated fashion, sensation intact  Labs on Admission: I have personally reviewed following labs and imaging studies  CBC:  Recent Labs Lab 12/30/15 0204  WBC 4.5  NEUTROABS 3.4  HGB 11.2*  HCT 33.3*  MCV 91.7  PLT [id number]   Basic Metabolic Panel:  Recent Labs Lab 12/30/15 0204  NA 130*  K 3.2*  CL 91*  CO2 24  GLUCOSE 512*  BUN 7  CREATININE 0.54  CALCIUM 9.5   GFR: Estimated Creatinine Clearance: 60.8 mL/min (by C-G formula based on SCr of 0.8 mg/dL). Liver Function Tests:  Recent Labs Lab 12/30/15 0204  AST 82*  ALT 72*  ALKPHOS 179*  BILITOT 1.7*  PROT 8.3*  ALBUMIN 3.3*    Recent Labs Lab 12/30/15 0204  LIPASE 65*    Recent Labs Lab 12/30/15 0204  AMMONIA 60*   Coagulation Profile:  Recent Labs Lab 12/30/15 0204  INR 1.18   Cardiac Enzymes: No results for input(s): CKTOTAL, CKMB, CKMBINDEX, TROPONINI in the last 168 hours. BNP (last 3 results) No results for input(s): PROBNP in the last 8760 hours. HbA1C: No results for input(s): HGBA1C in the last 72 hours. CBG: No results for input(s): GLUCAP in the last 168 hours. Lipid Profile: No results for input(s): CHOL, HDL,  LDLCALC, TRIG, CHOLHDL, LDLDIRECT in the last 72 hours. Thyroid Function Tests: No results for input(s): TSH, T4TOTAL, FREET4, T3FREE, THYROIDAB in the last 72 hours. Anemia Panel: No results for input(s): VITAMINB12, FOLATE, FERRITIN, TIBC, IRON, RETICCTPCT in the last 72 hours. Urine analysis:    Component Value Date/Time   COLORURINE YELLOW 12/30/2015 0158   APPEARANCEUR CLEAR 12/30/2015 0158   LABSPEC 1.035 (H) 12/30/2015 0158   PHURINE 6.5 12/30/2015 0158   GLUCOSEU >1000 (A) 12/30/2015 0158   GLUCOSEU >=1000 (A) 07/18/2014 0943   HGBUR NEGATIVE 12/30/2015 0158   BILIRUBINUR NEGATIVE 12/30/2015 0158   BILIRUBINUR neg 05/19/2013 0855   KETONESUR NEGATIVE 12/30/2015 0158   PROTEINUR NEGATIVE 12/30/2015 0158   UROBILINOGEN 1.0 03/02/2015 1041   NITRITE NEGATIVE 12/30/2015 0158   LEUKOCYTESUR NEGATIVE 12/30/2015 0158    Creatinine Clearance: Estimated Creatinine Clearance: 60.8 mL/min (by C-G formula based on SCr of 0.8 mg/dL).  Sepsis Labs: @LABRCNTIP (procalcitonin:4,lacticidven:4) )No results found for this or any previous visit (from the past 240 hour(s)).   Radiological Exams on Admission: No results found.  EKG: Independently reviewed.  Sinus tachycardia with rate 120; NSCSLT Assessment/Plan Principal Problem:   Alcoholic cirrhosis of liver with ascites (HCC) Active Problems:   Alcohol dependence (HCC)   Hypokalemia   GERD (gastroesophageal reflux disease)   Hyponatremia   Diabetes mellitus type II, uncontrolled (HCC)   Alcoholic cirrhosis with ascites -For large-volume paracentesis prior to transfer to floor from ER -If ER doctor is unsuccessful, will need to order under fluoroscopy -Will send fluid to r/o SBP although patient does not have any other evidence of infection at this time -Continues to drink - cessation encouraged -Recent Hep testing showed Hep A positive Also with Hep C but not a good treatment candidate due to ongoing ETOH use -Consider GI  consultation in AM -Will check UDS -Continue home lasix/aldactone  DM, uncontrolled -patient without evidence of DKA -Consistently marked elevated glucose -Continue home insulin to see if good control can be obtained in a controlled setting -Hold oral meds  Hyponatremia -Will check urinary and serum Osm as well as urinary  sodium and potassium levels  Hypokalemia -Will replete  -Patient with significant foot cramps prior to presentation  ETOH dependence -Suspect that patient minimizes her ongoing use of ETOH -Will place on CIWA protocol  DVT prophylaxis: Lovenox Code Status:  Full - confirmed with patient/family Family Communication: None present  Disposition Plan:  Home once clinically improved Consults called: Will need GI in AM  Admission status: Admit to Med Surg - I anticipate that this patient will require at least 2 midnights in the hospital based on the medical complexity of the problems presented.   Karmen Bongo MD [facility]  If 7PM-7AM, please contact night-coverage [email or web address] Password [email or web address]  12/30/2015, 4:41 AM

## 2015-12-30 NOTE — ED Triage Notes (Signed)
Patient was picked by EMS at her home. Patient complaining of abdominal pain. Patients abdomen is distended.

## 2015-12-30 NOTE — Progress Notes (Signed)
[name] is a 50 y.o. female patient admitted from ED awake, alert - oriented  X 4 - no acute distress noted.  VSS - Blood pressure 109/80, pulse (!) 121, temperature 99 F (37.2 C), temperature source Oral, resp. rate 18, height 5' 2.25" (1.581 m), weight 45.8 kg (101 lb), last menstrual period 11/01/2011, SpO2 100 %.    IV in place, occlusive dsg intact without redness.  Orientation to room, and floor completed with information packet given to patient/family.  Patient declined safety video at this time.  Admission INP armband ID verified with patient/family, and in place.   SR up x 2, fall assessment complete, with patient and family able to verbalize understanding of risk associated with falls, and verbalized understanding to call nsg before up out of bed.  Call light within reach, patient able to voice, and demonstrate understanding.  Skin, clean-dry- intact without evidence of bruising, or skin tears. "Ant bites" noted on feet.  No evidence of skin break down noted on exam.     Will cont to eval and treat per MD orders.  Marcy Salvo, RN 12/30/2015 6:38 AM

## 2015-12-30 NOTE — Discharge Summary (Signed)
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 Reports seeing GI 1 week back. Also had hepatitis A recently. Reportedly has recurrent paracenteses as outpatient. Patient was found to have elevated blood glucose in 500s with mild anion gap. Also given increased abdominal distention was admitted for need of paracentesis.   Hospital course Decompensated alcoholic cirrhosis Paracentesis unsuccessful in the ED. Ultrasound-guided paracentesis attempted by IR but found to have undrainable ascites fluid. She has some abdominal distention which possibly is chronic. A CT of the abdomen and pelvis with contrast done showed cirrhotic changes no mass lesion seen, chronic portal hypertension and small volume ascites. Resume her Lasix and Aldactone. Continue lactulose. Patient should follow-up  with Dr. Carlean Purl as outpatient.  Alcoholic gastritis Likely contributing to her abdominal discomfort. Symptoms much better now. Alcohol level was 153 on admission with lipase of 60. Counseled strongly on alcohol cessation. No withdrawal symptoms. Continue PPI.  Uncontrolled type 2 diabetes mellitus with nonketotic hyperosmolar state Patient required insulin drip on admission. Anion gap on subsequent lab closed and CBG much better. I have increased her Lantus to 55 units daily and pre-meal aspart 4 units 3 times a day. She has appointment on 10/6 at [facility].  Hypokalemia Replenish    Patient stable to be discharged home with outpatient follow-up. I have instructed her to use as needed Tylenol or occasional Motrin for her pain symptoms. From previous hospitalization it appears patient does have some narcotic seeking behavior. No narcotics were prescribed.  Family communication: None at bedside  Consults: None  Disposition: Home  Discharge Instructions     Medication List    TAKE these medications   calcium carbonate 750 MG chewable tablet Commonly known as:  TUMS EX Chew 3 tablets by mouth 4 (four) times daily as needed for heartburn.   furosemide 40 MG tablet Commonly known as:  LASIX Take 2 tablets (80 mg total) by mouth daily.   insulin aspart 100 UNIT/ML FlexPen Commonly known as:  NOVOLOG FLEXPEN INJECT 4 UNITS INTO SKIN 3 TIMES DAILY BEFORE MEALS What changed:  See the new instructions.   insulin glargine 100 unit/mL Sopn Commonly known as:  LANTUS Inject 0.55 mLs (55 Units total) into the skin at bedtime. What changed:  how much to take   lactulose 10 GM/15ML solution Commonly known as:  CHRONULAC Take 20 g by mouth 3 (three) times daily as needed for mild constipation.   metFORMIN 500 MG tablet Commonly known as:  GLUCOPHAGE Take 500 mg by mouth 2 (two)  times daily with a meal.   multivitamin with minerals Tabs tablet Take 1  tablet by mouth daily.   pantoprazole 40 MG tablet Commonly known as:  PROTONIX Take 1 tablet (40 mg total) by mouth 2 (two) times daily.   potassium chloride 10 MEQ tablet Commonly known as:  KLOR-CON M10 Take 1 tablet (10 mEq total) by mouth daily.   sertraline 100 MG tablet Commonly known as:  ZOLOFT Take 150 mg by mouth daily.   spironolactone 100 MG tablet Commonly known as:  ALDACTONE Take 2 tablets (200 mg total) by mouth daily.       Allergies  Allergen Reactions  . Other Itching and Other (See Comments)    Pt states that she is allergic to CHG wipes.    . Betadine [Povidone Iodine] Hives  . Chlorhexidine Gluconate Itching    Chg wipes      Procedures/Studies: Ct Abdomen Pelvis W Contrast  Result Date: 12/30/2015 CLINICAL DATA:  Cirrhosis. EXAM: CT ABDOMEN AND PELVIS WITH CONTRAST TECHNIQUE: Multidetector CT imaging of the abdomen and pelvis was performed using the standard protocol following bolus administration of intravenous contrast. CONTRAST:  129mL ISOVUE-300 IOPAMIDOL (ISOVUE-300) INJECTION 61% COMPARISON:  03/30/2015. FINDINGS: Lower chest: Subsegmental dependent atelectasis bilaterally. Hepatobiliary: Markedly nodular hepatic contour compatible with reported clinical history of cirrhosis. No hypervascular mass lesion identified within the liver parenchyma. Areas of hypo enhancement suggests fibrosis. Small stones noted in the gallbladder. No intrahepatic or extrahepatic biliary dilation. Pancreas: No focal mass lesion. No dilatation of the main duct. No intraparenchymal cyst. No peripancreatic edema. Spleen: Spleen measures 15.2 cm cranial caudal length , borderline and mildly enlarged. Adrenals/Urinary Tract: No adrenal nodule or mass. Right kidney unremarkable. 1-2 mm nonobstructing stone identified lower pole left kidney. No evidence for hydroureter. The urinary bladder appears normal for the degree of distention. Stomach/Bowel: Stomach is nondistended. No  gastric wall thickening. No evidence of outlet obstruction. Duodenum is normally positioned as is the ligament of Treitz. No small bowel wall thickening. No small bowel dilatation. The terminal ileum is normal. The appendix is not visualized, but there is no edema or inflammation in the region of the cecum. No gross colonic mass. No colonic wall thickening. No substantial diverticular change. Vascular/Lymphatic: No abdominal aortic aneurysm. No abdominal aortic atherosclerotic calcification. Portal vein and superior mesenteric vein are patent as is the splenic vein. Paraesophageal varices again noted. 13 mm short axis hepatoduodenal ligament lymph node measured on the previous study is stable in the interval. No gastrohepatic ligament lymphadenopathy. No retroperitoneal lymphadenopathy. No pelvic sidewall lymphadenopathy. Reproductive: Uterus surgically absent.  There is no adnexal mass. Other: Small volume free fluid is seen around the liver and spleen. There is a small amount of free fluid identified in the anatomic pelvis. Musculoskeletal: Bone windows reveal no worrisome lytic or sclerotic osseous lesions. IMPRESSION: 1. Morphologic changes in the liver compatible with cirrhosis. No hypervascular mass lesion evident on today's study. 2. Splenomegaly with paraesophageal varices, consistent with portal venous hypertension. 3. Cholelithiasis. 4. Small volume ascites Electronically Signed   By: Misty Stanley M.D.   On: 12/30/2015 14:04   US Abdomen Limited  Result Date: 12/30/2015 CLINICAL DATA:  History of ascites. EXAM: LIMITED ABDOMEN ULTRASOUND FOR ASCITES TECHNIQUE: Limited ultrasound survey for ascites was performed in all four abdominal quadrants. COMPARISON:  11/27/2015. FINDINGS: Four quadrant limited evaluation the abdomen shows trace intraperitoneal free fluid, insufficient to allow safe paracentesis. IMPRESSION: Insufficient fluid for paracentesis. Electronically Signed   By: Randall Hiss  Tery Sanfilippo M.D.   On:  12/30/2015 11:00       Subjective: None examined. CBG improved. Abdominal pain better.  Discharge Exam: Vitals:   12/30/15 0623 12/30/15 1310  BP: 109/80 116/79  Pulse: (!) 121 (!) 110  Resp: 18 16  Temp: 99 F (37.2 C) 98 F (36.7 C)   Vitals:   12/30/15 0530 12/30/15 0600 12/30/15 0623 12/30/15 1310  BP: 119/91 121/88 109/80 116/79  Pulse: 110 118 (!) 121 (!) 110  Resp: 10 14 18 16   Temp:   99 F (37.2 C) 98 F (36.7 C)  TempSrc:   Oral Oral  SpO2: 95% 95% 100% 99%  Weight:      Height:        General: Middle aged frail female not in distress HEENT: Pallor present, no icterus, moist mucosa, supple neck   chest: Clear bilaterally   CVS: S1 and S2, no murmurs or gallop GI: Abdominal distention, tense, nontender, bowel sounds present Musculoskeletal: Warm, no edema CNS: Alert and oriented   The results of significant diagnostics from this hospitalization (including imaging, microbiology, ancillary and laboratory) are listed below for reference.     Microbiology: No results found for this or any previous visit (from the past 240 hour(s)).   Labs: BNP (last 3 results)  Recent Labs  05/23/15 2139  BNP [id number]   Basic Metabolic Panel:  Recent Labs Lab 12/30/15 0204 12/30/15 0631  NA 130* 130*  K 3.2* 3.3*  CL 91* 93*  CO2 24 28  GLUCOSE 512* 372*  BUN 7 6  CREATININE 0.54 0.51  CALCIUM 9.5 8.8*   Liver Function Tests:  Recent Labs Lab 12/30/15 0204  AST 82*  ALT 72*  ALKPHOS 179*  BILITOT 1.7*  PROT 8.3*  ALBUMIN 3.3*    Recent Labs Lab 12/30/15 0204  LIPASE 65*    Recent Labs Lab 12/30/15 0204  AMMONIA 60*   CBC:  Recent Labs Lab 12/30/15 0204 12/30/15 0631  WBC 4.5 3.7*  NEUTROABS 3.4  --   HGB 11.2* 10.3*  HCT 33.3* 31.2*  MCV 91.7 92.6  PLT 250 77*   Cardiac Enzymes: No results for input(s): CKTOTAL, CKMB, CKMBINDEX, TROPONINI in the last 168 hours. BNP: Invalid input(s): POCBNP CBG:  Recent Labs Lab  12/30/15 0508 12/30/15 0721 12/30/15 0851 12/30/15 1046 12/30/15 1234  GLUCAP 442* 343* 321* 298* 198*   D-Dimer No results for input(s): DDIMER in the last 72 hours. Hgb A1c No results for input(s): HGBA1C in the last 72 hours. Lipid Profile No results for input(s): CHOL, HDL, LDLCALC, TRIG, CHOLHDL, LDLDIRECT in the last 72 hours. Thyroid function studies No results for input(s): TSH, T4TOTAL, T3FREE, THYROIDAB in the last 72 hours.  Invalid input(s): FREET3 Anemia work up No results for input(s): VITAMINB12, FOLATE, FERRITIN, TIBC, IRON, RETICCTPCT in the last 72 hours. Urinalysis    Component Value Date/Time   COLORURINE YELLOW 12/30/2015 0158   APPEARANCEUR CLEAR 12/30/2015 0158   LABSPEC 1.035 (H) 12/30/2015 0158   PHURINE 6.5 12/30/2015 0158   GLUCOSEU >1000 (A) 12/30/2015 0158   GLUCOSEU >=1000 (A) 07/18/2014 0943   HGBUR NEGATIVE 12/30/2015 0158   BILIRUBINUR NEGATIVE 12/30/2015 0158   BILIRUBINUR neg 05/19/2013 0855   KETONESUR NEGATIVE 12/30/2015 0158   PROTEINUR NEGATIVE 12/30/2015 0158   UROBILINOGEN 1.0 03/02/2015 1041   NITRITE NEGATIVE 12/30/2015 0158   LEUKOCYTESUR NEGATIVE 12/30/2015 0158   Sepsis Labs Invalid input(s): PROCALCITONIN,  WBC,  LACTICIDVEN Microbiology No results found for this  or any previous visit (from the past 240 hour(s)).   Time coordinating discharge: < 30 minutes  SIGNED:   Louellen Molder, MD  [facility] 12/30/2015, 3:33 PM Pager   If 7PM-7AM, please contact night-coverage [email or web address] Password [email or web address]

## 2015-12-30 NOTE — ED Notes (Signed)
Bed: WA06 Expected date:  Expected time:  Means of arrival:  Comments: 50 yo F  abd pain, hx of cirrhosis

## 2015-12-31 LAB — HEMOGLOBIN A1C
HEMOGLOBIN A1C: 10.5 % — AB (ref 4.8–5.6)
Mean Plasma Glucose: 255 mg/dL

## 2016-01-17 ENCOUNTER — Telehealth: Payer: Self-pay

## 2016-01-17 NOTE — Telephone Encounter (Signed)
On 01/17/2016 I received a death certificate from [facility] (faxed). The death certificate is for cremation. The patient is a patient of Doctor Cathlean Cower. The death certificate will be taken to [facility] this pm for signature.  On 02/07/16 I received the death certificate back from Doctor Cathlean Cower. I got the death certificate ready and faxed the death certificate over to the funeral home per the funeral home request.

## 2016-01-21 DEATH — deceased

## 2016-01-26 ENCOUNTER — Encounter: Payer: Self-pay | Admitting: Internal Medicine

## 2016-03-19 ENCOUNTER — Ambulatory Visit: Payer: Self-pay | Admitting: Internal Medicine

## 2016-09-29 IMAGING — US US ABDOMEN LIMITED
1 series · 14 of 25 positions shown · non-contrast
Comparison: Prior study from 12/24/2013

CLINICAL DATA: Initial valuation for cirrhosis. No wound
gallstones.

EXAM:
US ABDOMEN LIMITED - RIGHT UPPER QUADRANT

[Series 1: us abdomen limited · 0.21mm/px · 14 of 46 slices shown]
[im 1/46]
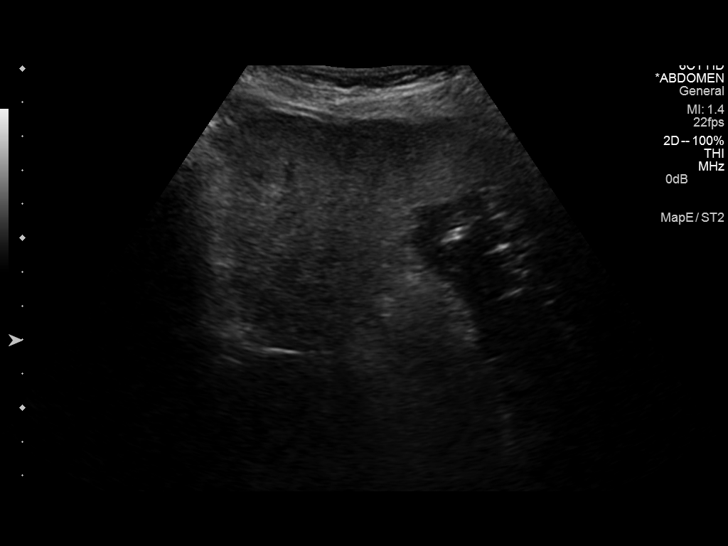
[im 4/46]
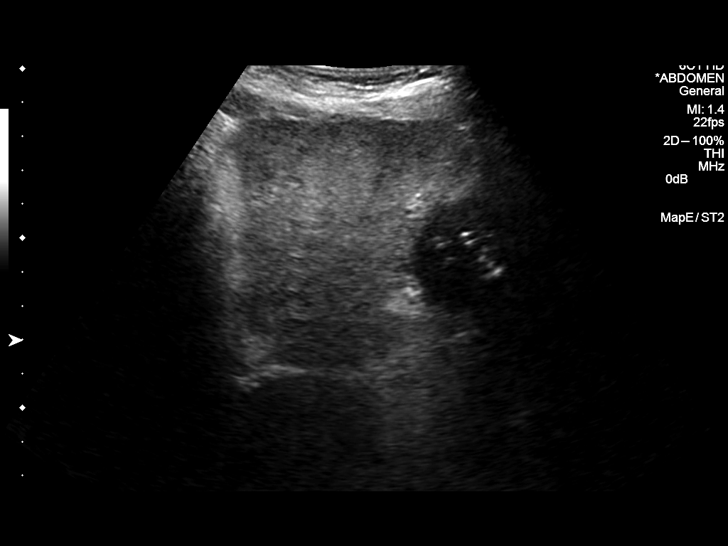
[im 8/46]
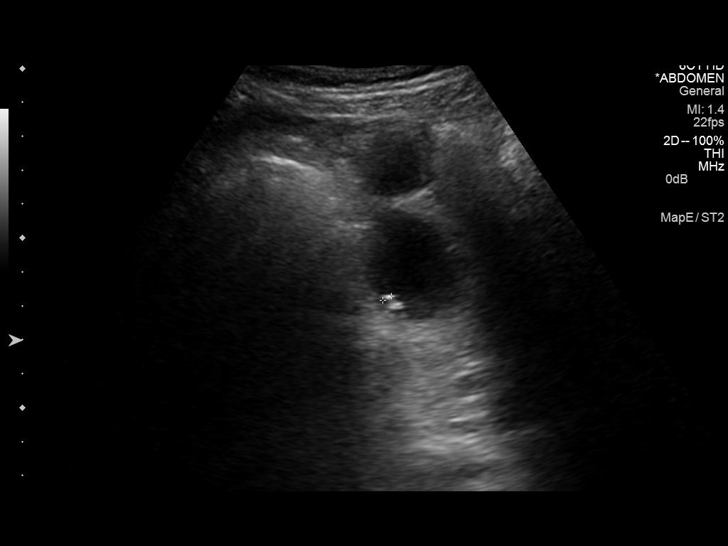
[im 12/46]
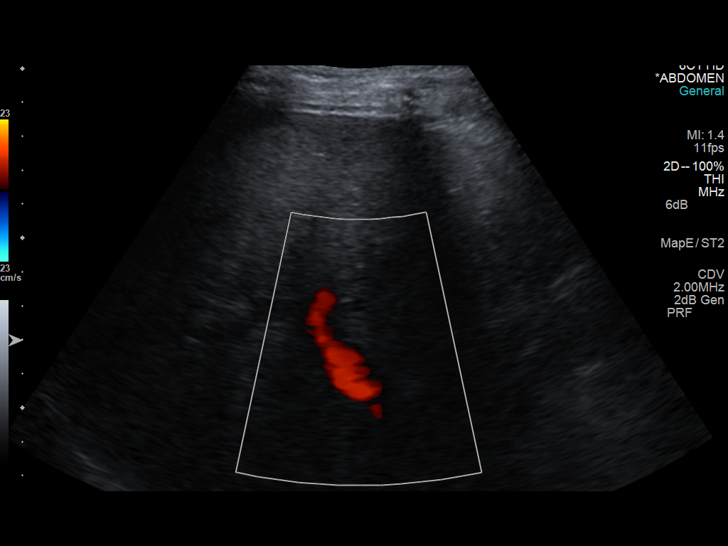
[im 16/46]
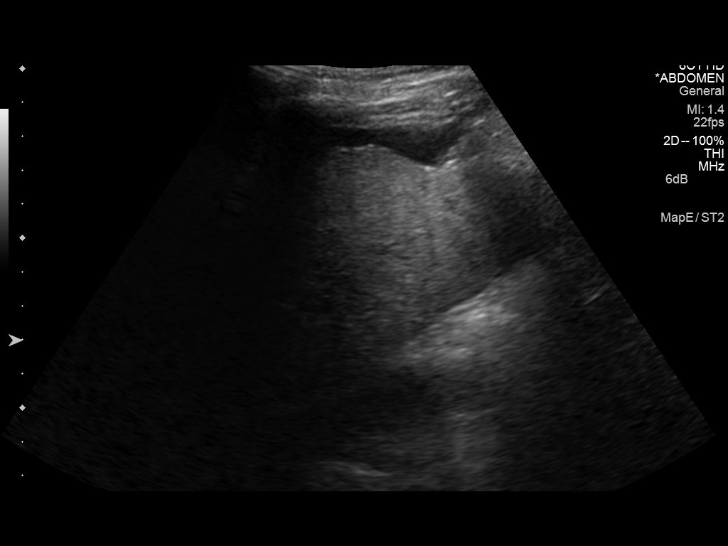
[im 17/46]
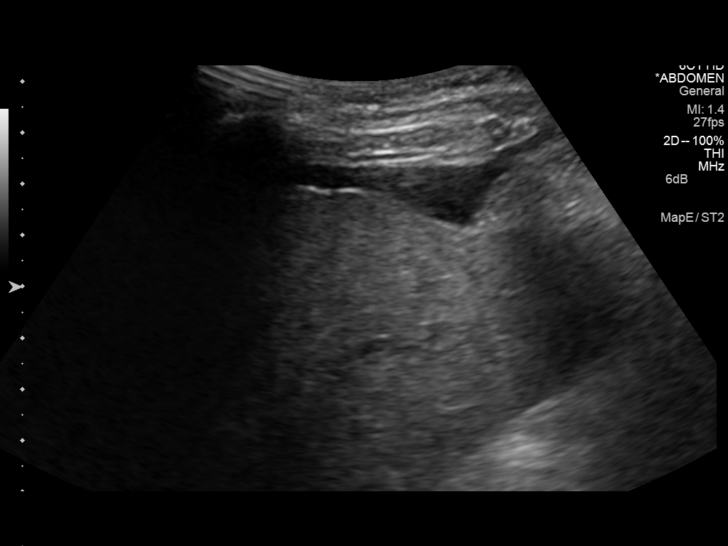
[im 21/46]
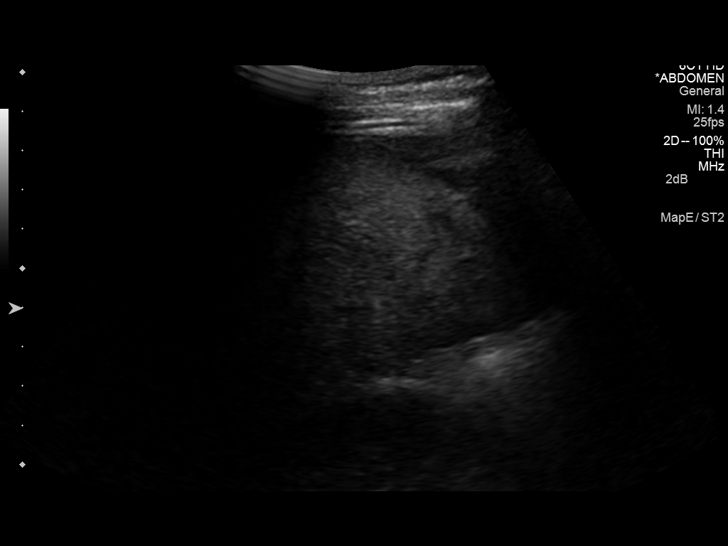
[im 25/46]
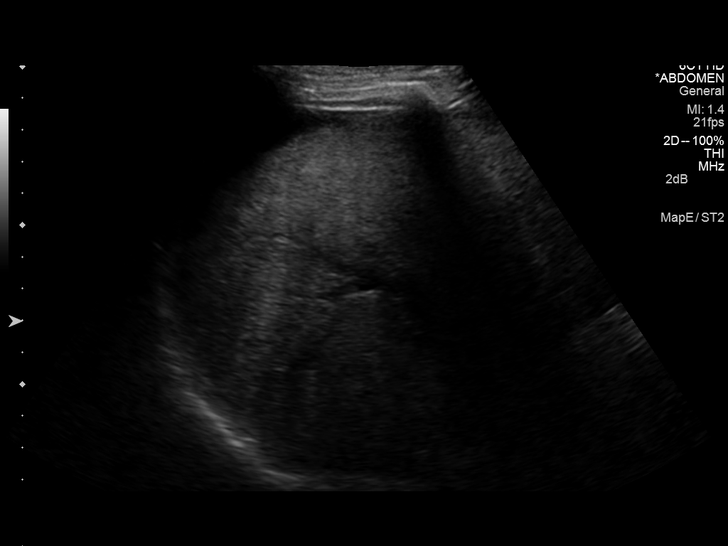
[im 29/46]
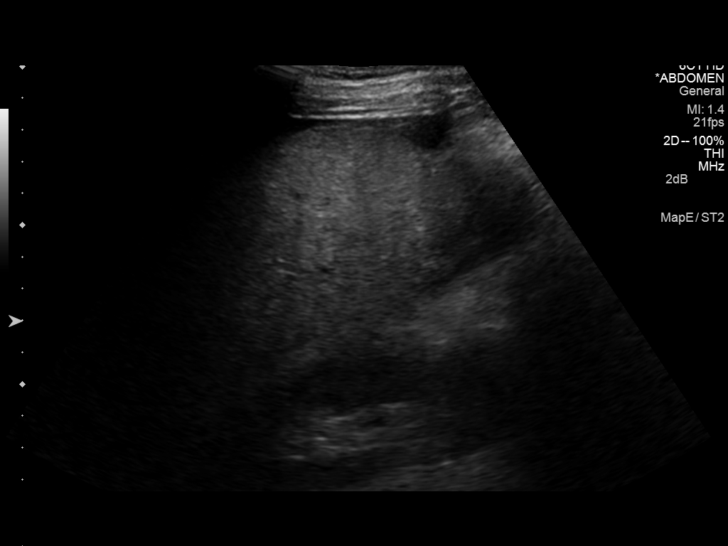
[im 31/46]
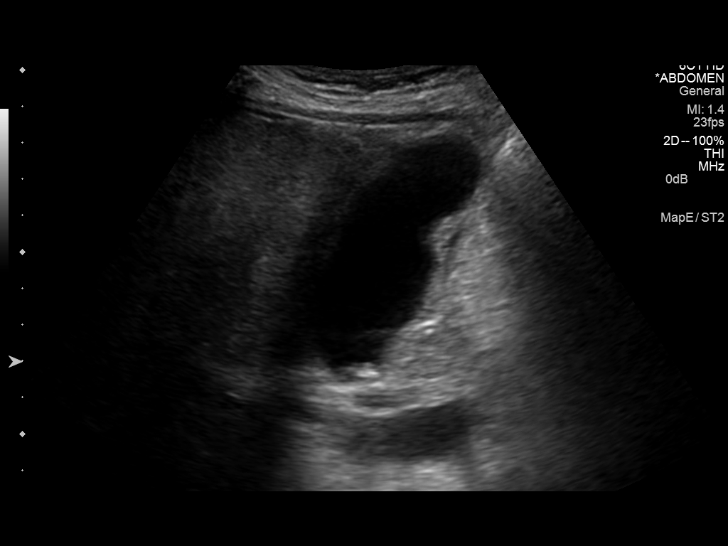
[im 34/46]
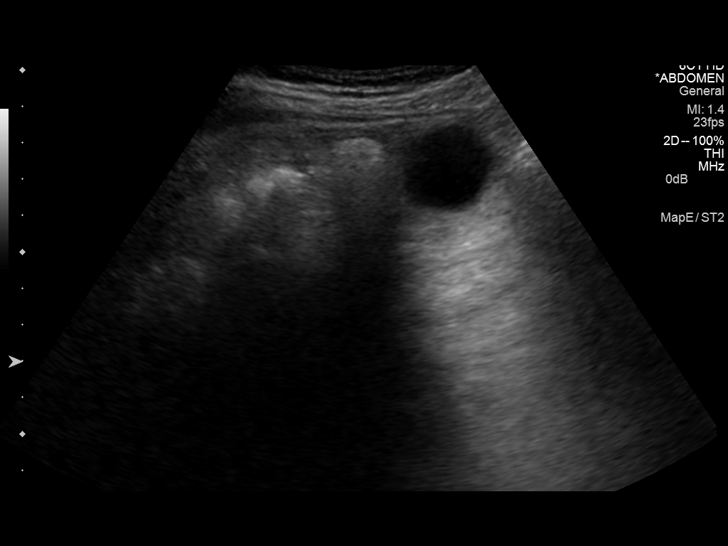
[im 38/46]
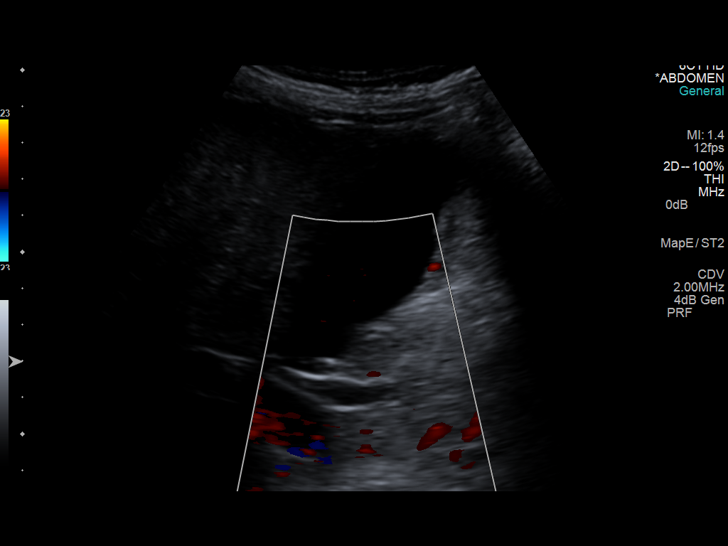
[im 42/46]
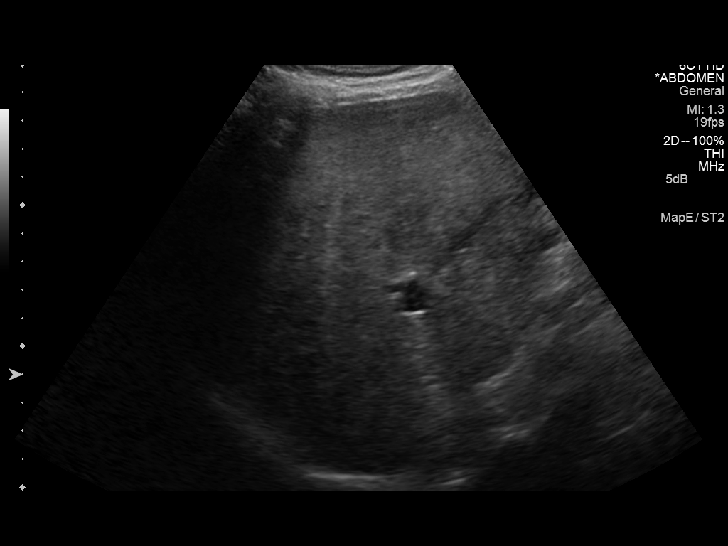
[im 46/46]
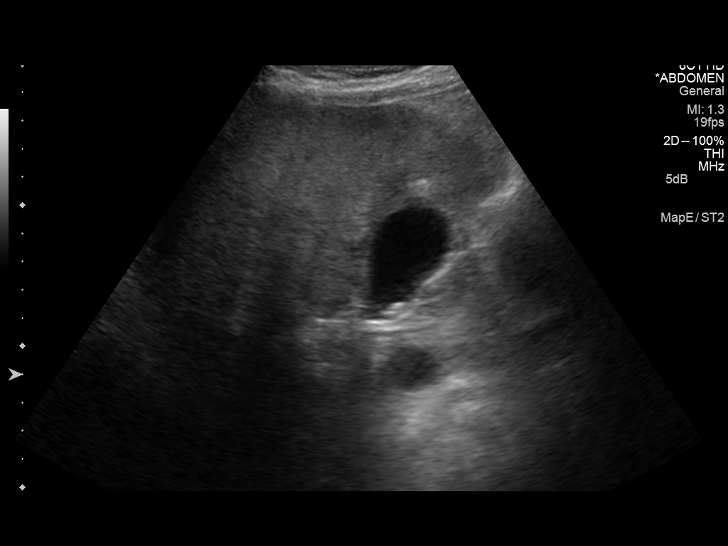

[14 of 25 positions shown; findings below may reference images not displayed]

FINDINGS: Gallbladder:

Several mobile echogenic stones measuring up to 2.8 mm present
within the gallbladder lumen. No gallbladder wall thickening. No
sonographic Murphy sign elicited on exam.

Common bile duct:

Diameter: 5.4 mm.

Liver:

Liver demonstrates a coarse echogenic echotexture with nodular
contour, consistent with cirrhosis. No focal intrahepatic lesions.
Ascites present within the right upper quadrant.
IMPRESSION: 1. Cholelithiasis without sonographic evidence for acute
cholecystitis.
2. Hepatic cirrhosis.
3. Ascites.

## 2016-09-29 IMAGING — NM NM HEPATOBILIARY IMAGE, INC GB
2 series · 12 of 12 positions shown · non-contrast
Comparison: Ultrasound right upper quadrant February 14, 2014

CLINICAL DATA: Chronic abdominal pain. History of underlying
parenchymal liver disease and hepatitis-C. Known cholelithiasis.

EXAM:
NUCLEAR MEDICINE HEPATOBILIARY IMAGING WITH GALLBLADDER EF
Views:  Anterior, right lateral right upper quadrant
Radionuclide:  Technetium 99m Choletec
Dose:  5.0 mCi
Route of administration: Intravenous

[Series 1: biliary · 4.14mm/px · 6 of 52 frames shown]
[frame 5/52]
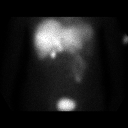
[frame 13/52]
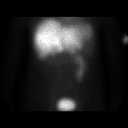
[frame 22/52]
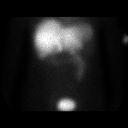
[frame 31/52]
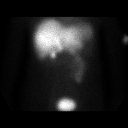
[frame 39/52]
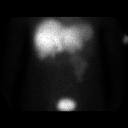
[frame 48/52]
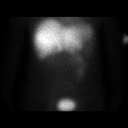

[Series 4: gbef · 4.14mm/px · 6 of 30 frames shown]
[frame 3/30]
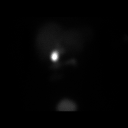
[frame 8/30]
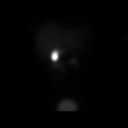
[frame 13/30]
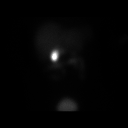
[frame 18/30]
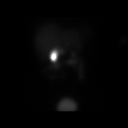
[frame 23/30]
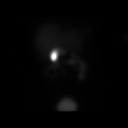
[frame 28/30]
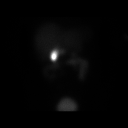

[12 of 12 positions shown; findings below may reference images not displayed]

FINDINGS: The liver uptake of radiotracer is within normal limits. There is
prompt visualization of gallbladder and small bowel, indicating
patency of the cystic and common bile ducts. A weight based dose,
1.11 mcg, of CCK was administered intravenously with calculation of
the computer generated ejection fraction of radiotracer from the
gallbladder. The patient did not experience clinical symptoms with
the CCK administration. The computer generated ejection fraction of
radiotracer from the gallbladder is abnormally low at 18%, normal
greater than 38%.
IMPRESSION: Abnormally low ejection fraction of radiotracer from the
gallbladder, a finding indicative of biliary dyskinesia. Cystic and
common bile ducts are patent as is evidenced by visualization of
gallbladder and small bowel.
